# Patient Record
Sex: Male | Born: 1955 | Race: Black or African American | Hispanic: No | Marital: Single | Smoking: Former smoker
Health system: Southern US, Community
[De-identification: ages and names within clinical notes are randomized; demographics above are authoritative.]

## PROBLEM LIST (undated history)

## (undated) DIAGNOSIS — C801 Malignant (primary) neoplasm, unspecified: Secondary | ICD-10-CM

## (undated) DIAGNOSIS — I712 Thoracic aortic aneurysm, without rupture: Secondary | ICD-10-CM

## (undated) DIAGNOSIS — Z87442 Personal history of urinary calculi: Secondary | ICD-10-CM

## (undated) DIAGNOSIS — I639 Cerebral infarction, unspecified: Secondary | ICD-10-CM

## (undated) DIAGNOSIS — G9511 Acute infarction of spinal cord (embolic) (nonembolic): Secondary | ICD-10-CM

## (undated) DIAGNOSIS — I10 Essential (primary) hypertension: Secondary | ICD-10-CM

## (undated) HISTORY — DX: Cerebral infarction, unspecified: I63.9

## (undated) HISTORY — PX: VASCULAR SURGERY: SHX849

## (undated) HISTORY — DX: Acute infarction of spinal cord (embolic) (nonembolic): G95.11

## (undated) HISTORY — PX: CARDIAC CATHETERIZATION: SHX172

## (undated) HISTORY — PX: OTHER SURGICAL HISTORY: SHX169

---

## 2006-10-17 ENCOUNTER — Emergency Department (HOSPITAL_COMMUNITY): Admission: EM | Admit: 2006-10-17 | Discharge: 2006-10-17 | Payer: Self-pay | Admitting: Emergency Medicine

## 2014-09-25 DIAGNOSIS — I712 Thoracic aortic aneurysm, without rupture, unspecified: Secondary | ICD-10-CM

## 2014-09-25 HISTORY — DX: Thoracic aortic aneurysm, without rupture, unspecified: I71.20

## 2014-09-25 HISTORY — DX: Thoracic aortic aneurysm, without rupture: I71.2

## 2014-10-06 ENCOUNTER — Emergency Department (HOSPITAL_COMMUNITY): Payer: Self-pay | Admitting: Anesthesiology

## 2014-10-06 ENCOUNTER — Emergency Department (HOSPITAL_COMMUNITY): Payer: Self-pay

## 2014-10-06 ENCOUNTER — Inpatient Hospital Stay (HOSPITAL_COMMUNITY)
Admission: EM | Admit: 2014-10-06 | Discharge: 2014-10-15 | DRG: 219 | Disposition: A | Discharge status: Home / Self Care | Payer: Self-pay | Attending: Cardiothoracic Surgery | Admitting: Cardiothoracic Surgery

## 2014-10-06 ENCOUNTER — Emergency Department (HOSPITAL_COMMUNITY): Payer: MEDICAID | Admitting: Anesthesiology

## 2014-10-06 ENCOUNTER — Other Ambulatory Visit (HOSPITAL_COMMUNITY): Payer: Self-pay

## 2014-10-06 ENCOUNTER — Encounter (HOSPITAL_COMMUNITY)
Admission: EM | Disposition: A | Discharge status: Home / Self Care | Payer: Self-pay | Source: Home / Self Care | Attending: Cardiothoracic Surgery

## 2014-10-06 ENCOUNTER — Encounter (HOSPITAL_COMMUNITY): Payer: Self-pay | Admitting: Physical Medicine and Rehabilitation

## 2014-10-06 DIAGNOSIS — J690 Pneumonitis due to inhalation of food and vomit: Secondary | ICD-10-CM

## 2014-10-06 DIAGNOSIS — E119 Type 2 diabetes mellitus without complications: Secondary | ICD-10-CM | POA: Diagnosis present

## 2014-10-06 DIAGNOSIS — J189 Pneumonia, unspecified organism: Secondary | ICD-10-CM | POA: Diagnosis present

## 2014-10-06 DIAGNOSIS — I998 Other disorder of circulatory system: Secondary | ICD-10-CM | POA: Diagnosis not present

## 2014-10-06 DIAGNOSIS — K4090 Unilateral inguinal hernia, without obstruction or gangrene, not specified as recurrent: Secondary | ICD-10-CM | POA: Diagnosis present

## 2014-10-06 DIAGNOSIS — I745 Embolism and thrombosis of iliac artery: Secondary | ICD-10-CM | POA: Diagnosis not present

## 2014-10-06 DIAGNOSIS — I10 Essential (primary) hypertension: Secondary | ICD-10-CM | POA: Diagnosis present

## 2014-10-06 DIAGNOSIS — D62 Acute posthemorrhagic anemia: Secondary | ICD-10-CM | POA: Diagnosis not present

## 2014-10-06 DIAGNOSIS — I71019 Dissection of thoracic aorta, unspecified: Secondary | ICD-10-CM

## 2014-10-06 DIAGNOSIS — J9601 Acute respiratory failure with hypoxia: Secondary | ICD-10-CM | POA: Diagnosis not present

## 2014-10-06 DIAGNOSIS — E876 Hypokalemia: Secondary | ICD-10-CM | POA: Diagnosis not present

## 2014-10-06 DIAGNOSIS — I739 Peripheral vascular disease, unspecified: Secondary | ICD-10-CM | POA: Diagnosis present

## 2014-10-06 DIAGNOSIS — I7100 Dissection of unspecified site of aorta: Secondary | ICD-10-CM | POA: Diagnosis present

## 2014-10-06 DIAGNOSIS — I7101 Dissection of thoracic aorta: Secondary | ICD-10-CM

## 2014-10-06 DIAGNOSIS — R0602 Shortness of breath: Secondary | ICD-10-CM

## 2014-10-06 DIAGNOSIS — R079 Chest pain, unspecified: Secondary | ICD-10-CM

## 2014-10-06 DIAGNOSIS — D689 Coagulation defect, unspecified: Secondary | ICD-10-CM | POA: Diagnosis not present

## 2014-10-06 DIAGNOSIS — Z952 Presence of prosthetic heart valve: Secondary | ICD-10-CM

## 2014-10-06 DIAGNOSIS — F17210 Nicotine dependence, cigarettes, uncomplicated: Secondary | ICD-10-CM | POA: Diagnosis present

## 2014-10-06 DIAGNOSIS — Y95 Nosocomial condition: Secondary | ICD-10-CM | POA: Diagnosis not present

## 2014-10-06 HISTORY — PX: THORACIC AORTIC ANEURYSM REPAIR: SHX799

## 2014-10-06 HISTORY — DX: Thoracic aortic aneurysm, without rupture: I71.2

## 2014-10-06 LAB — COMPREHENSIVE METABOLIC PANEL
ALBUMIN: 3.9 g/dL (ref 3.5–5.0)
ALT: 157 U/L — AB (ref 17–63)
ANION GAP: 10 (ref 5–15)
AST: 180 U/L — ABNORMAL HIGH (ref 15–41)
Alkaline Phosphatase: 61 U/L (ref 38–126)
BUN: 15 mg/dL (ref 6–20)
CALCIUM: 9 mg/dL (ref 8.9–10.3)
CHLORIDE: 105 mmol/L (ref 101–111)
CO2: 24 mmol/L (ref 22–32)
Creatinine, Ser: 1.47 mg/dL — ABNORMAL HIGH (ref 0.61–1.24)
GFR calc Af Amer: 59 mL/min — ABNORMAL LOW (ref >60)
GFR calc non Af Amer: 51 mL/min — ABNORMAL LOW (ref >60)
GLUCOSE: 102 mg/dL — AB (ref 65–99)
Potassium: 3.9 mmol/L (ref 3.5–5.1)
SODIUM: 139 mmol/L (ref 135–145)
Total Bilirubin: 1.4 mg/dL — ABNORMAL HIGH (ref 0.3–1.2)
Total Protein: 6.4 g/dL — ABNORMAL LOW (ref 6.5–8.1)

## 2014-10-06 LAB — CBC
HCT: 47.6 % (ref 39.0–52.0)
HEMOGLOBIN: 16.1 g/dL (ref 13.0–17.0)
MCH: 28.9 pg (ref 26.0–34.0)
MCHC: 33.8 g/dL (ref 30.0–36.0)
MCV: 85.5 fL (ref 78.0–100.0)
PLATELETS: 166 10*3/uL (ref 150–400)
RBC: 5.57 MIL/uL (ref 4.22–5.81)
RDW: 14.9 % (ref 11.5–15.5)
WBC: 8.6 10*3/uL (ref 4.0–10.5)

## 2014-10-06 LAB — PROTIME-INR
INR: 1.29 (ref 0.00–1.49)
Prothrombin Time: 16.2 seconds — ABNORMAL HIGH (ref 11.6–15.2)

## 2014-10-06 LAB — PREPARE RBC (CROSSMATCH)

## 2014-10-06 LAB — APTT: aPTT: 31 seconds (ref 24–37)

## 2014-10-06 LAB — I-STAT TROPONIN, ED: Troponin i, poc: 0.06 ng/mL (ref 0.00–0.08)

## 2014-10-06 LAB — BRAIN NATRIURETIC PEPTIDE: B NATRIURETIC PEPTIDE 5: 8.6 pg/mL (ref 0.0–100.0)

## 2014-10-06 LAB — ABO/RH: ABO/RH(D): O POS

## 2014-10-06 SURGERY — REPAIR, ANEURYSM, AORTA, THORACIC, ASCENDING
Anesthesia: General

## 2014-10-06 SURGERY — APPENDECTOMY, LAPAROSCOPIC
Anesthesia: General | Site: Abdomen

## 2014-10-06 MED ORDER — MAGNESIUM SULFATE 50 % IJ SOLN
40.0000 meq | INTRAMUSCULAR | Status: DC
  Filled 2014-10-06: qty 10

## 2014-10-06 MED ORDER — MORPHINE SULFATE 2 MG/ML IJ SOLN
2.0000 mg | Freq: Once | INTRAMUSCULAR | Status: AC
Start: 2014-10-06 — End: 2014-10-06
  Administered 2014-10-06: 2 mg via INTRAVENOUS
  Filled 2014-10-06: qty 1

## 2014-10-06 MED ORDER — LIDOCAINE HCL (CARDIAC) 20 MG/ML IV SOLN
INTRAVENOUS | Status: AC
  Filled 2014-10-06: qty 5

## 2014-10-06 MED ORDER — DOPAMINE-DEXTROSE 3.2-5 MG/ML-% IV SOLN
0.0000 ug/kg/min | INTRAVENOUS | Status: AC
  Administered 2014-10-06: 3 ug/kg/min via INTRAVENOUS
  Filled 2014-10-06: qty 250

## 2014-10-06 MED ORDER — NITROGLYCERIN IN D5W 200-5 MCG/ML-% IV SOLN
2.0000 ug/min | INTRAVENOUS | Status: DC
  Filled 2014-10-06: qty 250

## 2014-10-06 MED ORDER — SODIUM CHLORIDE 0.9 % IV SOLN
INTRAVENOUS | Status: AC
  Administered 2014-10-06: 1.8 [IU]/h via INTRAVENOUS
  Filled 2014-10-06: qty 2.5

## 2014-10-06 MED ORDER — PROTAMINE SULFATE 10 MG/ML IV SOLN
INTRAVENOUS | Status: AC
  Filled 2014-10-06: qty 25

## 2014-10-06 MED ORDER — DEXTROSE 5 % IV SOLN
1.5000 g | INTRAVENOUS | Status: AC
  Administered 2014-10-06: 1.5 g via INTRAVENOUS
  Filled 2014-10-06: qty 1.5

## 2014-10-06 MED ORDER — IOHEXOL 350 MG/ML SOLN: 100.0000 mL | Freq: Once | INTRAVENOUS | Status: AC | PRN

## 2014-10-06 MED ORDER — FENTANYL CITRATE (PF) 250 MCG/5ML IJ SOLN
INTRAMUSCULAR | Status: AC
  Filled 2014-10-06: qty 5

## 2014-10-06 MED ORDER — HEMOSTATIC AGENTS (NO CHARGE) OPTIME
TOPICAL | Status: DC | PRN
  Administered 2014-10-06: 1 via TOPICAL

## 2014-10-06 MED ORDER — SODIUM CHLORIDE 0.9 % IV SOLN
INTRAVENOUS | Status: AC
  Administered 2014-10-06: 69.8 mL/h via INTRAVENOUS
  Filled 2014-10-06: qty 40

## 2014-10-06 MED ORDER — ETOMIDATE 2 MG/ML IV SOLN
INTRAVENOUS | Status: AC
  Filled 2014-10-06: qty 20

## 2014-10-06 MED ORDER — LACTATED RINGERS IV SOLN
INTRAVENOUS | Status: DC | PRN
  Administered 2014-10-06: 19:00:00 via INTRAVENOUS

## 2014-10-06 MED ORDER — LIDOCAINE HCL (CARDIAC) 20 MG/ML IV SOLN
INTRAVENOUS | Status: DC | PRN
  Administered 2014-10-06: 60 mg via INTRAVENOUS

## 2014-10-06 MED ORDER — ROCURONIUM BROMIDE 100 MG/10ML IV SOLN
INTRAVENOUS | Status: DC | PRN
  Administered 2014-10-06: 50 mg via INTRAVENOUS
  Administered 2014-10-06: 20 mg via INTRAVENOUS
  Administered 2014-10-06 – 2014-10-07 (×2): 30 mg via INTRAVENOUS

## 2014-10-06 MED ORDER — HEPARIN SODIUM (PORCINE) 1000 UNIT/ML IJ SOLN
INTRAMUSCULAR | Status: AC
  Administered 2014-10-06: 20 mL
  Filled 2014-10-06: qty 30

## 2014-10-06 MED ORDER — METHYLPREDNISOLONE SODIUM SUCC 125 MG IJ SOLR
INTRAMUSCULAR | Status: DC | PRN
  Administered 2014-10-06: 125 mg via INTRAVENOUS

## 2014-10-06 MED ORDER — 0.9 % SODIUM CHLORIDE (POUR BTL) OPTIME
TOPICAL | Status: DC | PRN
  Administered 2014-10-06: 1000 mL

## 2014-10-06 MED ORDER — DEXMEDETOMIDINE HCL IN NACL 400 MCG/100ML IV SOLN
0.1000 ug/kg/h | INTRAVENOUS | Status: AC
  Administered 2014-10-06: .2 ug/kg/h via INTRAVENOUS
  Filled 2014-10-06: qty 100

## 2014-10-06 MED ORDER — ETOMIDATE 2 MG/ML IV SOLN
INTRAVENOUS | Status: DC | PRN
  Administered 2014-10-06: 12 mg via INTRAVENOUS
  Administered 2014-10-06: 20 mg via INTRAVENOUS

## 2014-10-06 MED ORDER — SODIUM CHLORIDE 0.9 % IV BOLUS (SEPSIS)
1000.0000 mL | Freq: Once | INTRAVENOUS | Status: AC
  Administered 2014-10-06: 1000 mL via INTRAVENOUS

## 2014-10-06 MED ORDER — ROCURONIUM BROMIDE 50 MG/5ML IV SOLN
INTRAVENOUS | Status: AC
  Filled 2014-10-06: qty 3

## 2014-10-06 MED ORDER — DEXTROSE 5 % IV SOLN
750.0000 mg | INTRAVENOUS | Status: DC
  Filled 2014-10-06: qty 750

## 2014-10-06 MED ORDER — VANCOMYCIN HCL 10 G IV SOLR
1250.0000 mg | INTRAVENOUS | Status: AC
  Administered 2014-10-06: 1250 mg via INTRAVENOUS
  Filled 2014-10-06: qty 1250

## 2014-10-06 MED ORDER — EPINEPHRINE HCL 1 MG/ML IJ SOLN
0.0000 ug/min | INTRAMUSCULAR | Status: DC
  Filled 2014-10-06: qty 4

## 2014-10-06 MED ORDER — PHENYLEPHRINE HCL 10 MG/ML IJ SOLN
30.0000 ug/min | INTRAVENOUS | Status: DC
  Filled 2014-10-06: qty 2

## 2014-10-06 MED ORDER — PLASMA-LYTE 148 IV SOLN
INTRAVENOUS | Status: AC
  Administered 2014-10-06: 500 mL
  Filled 2014-10-06: qty 2.5

## 2014-10-06 MED ORDER — MIDAZOLAM HCL 10 MG/2ML IJ SOLN
INTRAMUSCULAR | Status: AC
  Filled 2014-10-06: qty 2

## 2014-10-06 MED ORDER — HEMOSTATIC AGENTS (NO CHARGE) OPTIME
TOPICAL | Status: DC | PRN
  Administered 2014-10-06 – 2014-10-07 (×3): 1 via TOPICAL

## 2014-10-06 MED ORDER — METHYLPREDNISOLONE SODIUM SUCC 125 MG IJ SOLR
INTRAMUSCULAR | Status: AC
  Filled 2014-10-06: qty 2

## 2014-10-06 MED ORDER — POTASSIUM CHLORIDE 2 MEQ/ML IV SOLN
80.0000 meq | INTRAVENOUS | Status: DC
  Filled 2014-10-06: qty 40

## 2014-10-06 MED ORDER — SODIUM CHLORIDE 0.9 % IV SOLN: 10.0000 mL/h | Freq: Once | INTRAVENOUS | Status: DC

## 2014-10-06 MED ORDER — SUCCINYLCHOLINE CHLORIDE 20 MG/ML IJ SOLN
INTRAMUSCULAR | Status: AC
  Filled 2014-10-06: qty 1

## 2014-10-06 MED ORDER — ASPIRIN 81 MG PO CHEW
324.0000 mg | CHEWABLE_TABLET | Freq: Once | ORAL | Status: AC
  Administered 2014-10-06: 324 mg via ORAL
  Filled 2014-10-06: qty 4

## 2014-10-06 MED ORDER — FENTANYL CITRATE (PF) 250 MCG/5ML IJ SOLN
INTRAMUSCULAR | Status: DC | PRN
  Administered 2014-10-06 (×2): 50 ug via INTRAVENOUS
  Administered 2014-10-06: 250 ug via INTRAVENOUS
  Administered 2014-10-06: 150 ug via INTRAVENOUS
  Administered 2014-10-06: 500 ug via INTRAVENOUS
  Administered 2014-10-06 – 2014-10-07 (×2): 100 ug via INTRAVENOUS
  Administered 2014-10-07 (×2): 150 ug via INTRAVENOUS

## 2014-10-06 MED ORDER — SUCCINYLCHOLINE CHLORIDE 20 MG/ML IJ SOLN
INTRAMUSCULAR | Status: DC | PRN
  Administered 2014-10-06: 120 mg via INTRAVENOUS

## 2014-10-06 MED ORDER — MIDAZOLAM HCL 2 MG/2ML IJ SOLN
INTRAMUSCULAR | Status: DC | PRN
  Administered 2014-10-06 (×2): 3 mg via INTRAVENOUS
  Administered 2014-10-06: 4 mg via INTRAVENOUS

## 2014-10-06 MED ORDER — SODIUM CHLORIDE 0.9 % IJ SOLN
OROMUCOSAL | Status: DC | PRN
  Administered 2014-10-06 (×3): 1 mL via TOPICAL

## 2014-10-06 SURGICAL SUPPLY — 131 items
ADAPTER CARDIO PERF ANTE/RETRO (ADAPTER) ×2 IMPLANT
ADH SKN CLS APL DERMABOND .7 (GAUZE/BANDAGES/DRESSINGS) ×1
ADH SRG 12 PREFL SYR 3 SPRDR (MISCELLANEOUS) ×1
ADPR PRFSN 84XANTGRD RTRGD (ADAPTER) ×1
APL SRG 7X2 LUM MLBL SLNT (VASCULAR PRODUCTS) ×3
APPLICATOR TIP COSEAL (VASCULAR PRODUCTS) ×3 IMPLANT
BAG DECANTER FOR FLEXI CONT (MISCELLANEOUS) ×2 IMPLANT
BLADE STERNUM SYSTEM 6 (BLADE) ×2 IMPLANT
BLADE SURG 15 STRL LF DISP TIS (BLADE) ×1 IMPLANT
BLADE SURG 15 STRL SS (BLADE) ×2
BLADE SURG ROTATE 9660 (MISCELLANEOUS) IMPLANT
CANISTER SUCTION 2500CC (MISCELLANEOUS) ×2 IMPLANT
CANN PRFSN .5XCNCT 15X34-48 (MISCELLANEOUS)
CANNULA ARTERIAL 007325 (MISCELLANEOUS) IMPLANT
CANNULA ARTERIAL 14F 007324 (MISCELLANEOUS) IMPLANT
CANNULA PRFSN .5XCNCT 15X34-48 (MISCELLANEOUS) IMPLANT
CANNULA SUMP PERICARDIAL (CANNULA) ×2 IMPLANT
CANNULA VEN 2 STAGE (MISCELLANEOUS)
CATH CPB KIT GERHARDT (MISCELLANEOUS) ×2 IMPLANT
CATH HEMA 3WAY 30CC 24FR COUDE (CATHETERS) ×1 IMPLANT
CATH RETROPLEGIA CORONARY 14FR (CATHETERS) ×2 IMPLANT
CATH THORACIC 28FR (CATHETERS) IMPLANT
CATH/SQUID NICHOLS JEHLE COR (CATHETERS) ×1 IMPLANT
CLIP TI MEDIUM 6 (CLIP) ×2 IMPLANT
CLIP TI WIDE RED SMALL 24 (CLIP) IMPLANT
CLIP TI WIDE RED SMALL 6 (CLIP) IMPLANT
CONN 1/2X1/2X1/2  BEN (MISCELLANEOUS)
CONN 1/2X1/2X1/2 BEN (MISCELLANEOUS) IMPLANT
CONN 3/8X3/8 GISH STERILE (MISCELLANEOUS) IMPLANT
CONN ST 1/4X3/8  BEN (MISCELLANEOUS) ×2
CONN ST 1/4X3/8 BEN (MISCELLANEOUS) IMPLANT
CONN Y 3/8X3/8X3/8  BEN (MISCELLANEOUS)
CONN Y 3/8X3/8X3/8 BEN (MISCELLANEOUS) IMPLANT
CONT SPEC 4OZ CLIKSEAL STRL BL (MISCELLANEOUS) ×2 IMPLANT
CRADLE DONUT ADULT HEAD (MISCELLANEOUS) IMPLANT
DERMABOND ADVANCED (GAUZE/BANDAGES/DRESSINGS) ×1
DERMABOND ADVANCED .7 DNX12 (GAUZE/BANDAGES/DRESSINGS) IMPLANT
DRAIN CHANNEL 15F RND FF W/TCR (WOUND CARE) IMPLANT
DRAIN CHANNEL 19F RND (DRAIN) IMPLANT
DRAIN CHANNEL 28F RND 3/8 FF (WOUND CARE) ×2 IMPLANT
DRAIN SNY 10X20 3/4 PERF (WOUND CARE) IMPLANT
DRAIN WOUND SNY 15 RND (WOUND CARE) IMPLANT
DRSG AQUACEL AG ADV 3.5X14 (GAUZE/BANDAGES/DRESSINGS) ×2 IMPLANT
ELECT BLADE 4.0 EZ CLEAN MEGAD (MISCELLANEOUS) ×2
ELECT CAUTERY BLADE 6.4 (BLADE) ×2 IMPLANT
ELECT REM PT RETURN 9FT ADLT (ELECTROSURGICAL) ×4
ELECTRODE BLDE 4.0 EZ CLN MEGD (MISCELLANEOUS) ×1 IMPLANT
ELECTRODE REM PT RTRN 9FT ADLT (ELECTROSURGICAL) ×2 IMPLANT
EVACUATOR SILICONE 100CC (DRAIN) IMPLANT
FELT TEFLON 6X6 (MISCELLANEOUS) IMPLANT
GAUZE SPONGE 4X4 12PLY STRL (GAUZE/BANDAGES/DRESSINGS) ×4 IMPLANT
GLOVE BIO SURGEON STRL SZ 6.5 (GLOVE) ×9 IMPLANT
GOWN STRL REUS W/ TWL LRG LVL3 (GOWN DISPOSABLE) ×4 IMPLANT
GOWN STRL REUS W/TWL LRG LVL3 (GOWN DISPOSABLE) ×14
GRAFT GELWEAVE INPREG 30X30CM (Vascular Products) ×1 IMPLANT
HANDLE STAPLE ENDO GIA SHORT (STAPLE) ×1
HEMOSTAT POWDER SURGIFOAM 1G (HEMOSTASIS) IMPLANT
HEMOSTAT SURGICEL 2X14 (HEMOSTASIS) IMPLANT
INSERT FOGARTY SM (MISCELLANEOUS) IMPLANT
INSERT FOGARTY XLG (MISCELLANEOUS) ×1 IMPLANT
KIT BASIN OR (CUSTOM PROCEDURE TRAY) ×2 IMPLANT
KIT ROOM TURNOVER OR (KITS) ×2 IMPLANT
KIT SUCTION CATH 14FR (SUCTIONS) IMPLANT
LEAD PACING MYOCARDI (MISCELLANEOUS) ×2 IMPLANT
LOOP VESSEL MAXI BLUE (MISCELLANEOUS) ×1 IMPLANT
NDL 18GX1X1/2 (RX/OR ONLY) (NEEDLE) IMPLANT
NEEDLE 18GX1X1/2 (RX/OR ONLY) (NEEDLE) ×2 IMPLANT
NEEDLE AORTIC AIR ASPIRATING (NEEDLE) IMPLANT
NS IRRIG 1000ML POUR BTL (IV SOLUTION) ×8 IMPLANT
PACK OPEN HEART (CUSTOM PROCEDURE TRAY) ×2 IMPLANT
PAD ARMBOARD 7.5X6 YLW CONV (MISCELLANEOUS) ×4 IMPLANT
RELOAD ENDO GIA 30 3.5 (STAPLE) ×2 IMPLANT
SEALANT SURG COSEAL 8ML (VASCULAR PRODUCTS) ×3 IMPLANT
SPONGE GAUZE 4X4 12PLY STER LF (GAUZE/BANDAGES/DRESSINGS) ×2 IMPLANT
SPONGE LAP 18X18 X RAY DECT (DISPOSABLE) ×2 IMPLANT
SPONGE LAP 4X18 X RAY DECT (DISPOSABLE) ×1 IMPLANT
STAPLER ENDO GIA 12 SHRT THIN (STAPLE) IMPLANT
STAPLER ENDO GIA 12MM SHORT (STAPLE) ×1 IMPLANT
STAPLER VISISTAT 35W (STAPLE) IMPLANT
STOPCOCK 4 WAY LG BORE MALE ST (IV SETS) IMPLANT
STRIP CLOSURE SKIN 1/2X4 (GAUZE/BANDAGES/DRESSINGS) IMPLANT
SUT ETHIBOND 2 0 SH (SUTURE) ×8
SUT ETHIBOND 2 0 SH 36X2 (SUTURE) ×4 IMPLANT
SUT MNCRL AB 3-0 PS2 18 (SUTURE) IMPLANT
SUT PROLENE 3 0 RB 1 (SUTURE) ×3 IMPLANT
SUT PROLENE 3 0 SH 1 (SUTURE) ×4 IMPLANT
SUT PROLENE 3 0 SH DA (SUTURE) ×2 IMPLANT
SUT PROLENE 3 0 SH1 36 (SUTURE) ×6 IMPLANT
SUT PROLENE 4 0 RB 1 (SUTURE) ×10
SUT PROLENE 4 0 SH DA (SUTURE) IMPLANT
SUT PROLENE 4 0 TF (SUTURE) IMPLANT
SUT PROLENE 4-0 RB1 .5 CRCL 36 (SUTURE) IMPLANT
SUT PROLENE 5 0 C 1 36 (SUTURE) ×6 IMPLANT
SUT PROLENE 6 0 C 1 30 (SUTURE) ×2 IMPLANT
SUT PROLENE 6 0 CC (SUTURE) IMPLANT
SUT PROLENE 7 0 BV 1 (SUTURE) IMPLANT
SUT PROLENE 7 0 BV1 MDA (SUTURE) IMPLANT
SUT SILK 1 TIES 10X30 (SUTURE) ×2 IMPLANT
SUT SILK 2 0 SH CR/8 (SUTURE) ×4 IMPLANT
SUT SILK 2 0 TIES 17X18 (SUTURE) ×2
SUT SILK 2-0 18XBRD TIE BLK (SUTURE) ×1 IMPLANT
SUT SILK 3 0 SH CR/8 (SUTURE) ×2 IMPLANT
SUT SILK 4 0 TIES 17X18 (SUTURE) ×2 IMPLANT
SUT STEEL 6MS V (SUTURE) ×2 IMPLANT
SUT STEEL STERNAL CCS#1 18IN (SUTURE) IMPLANT
SUT STEEL SZ 6 DBL 3X14 BALL (SUTURE) IMPLANT
SUT TEM PAC WIRE 2 0 SH (SUTURE) ×4 IMPLANT
SUT VIC AB 1 CT1 18XCR BRD 8 (SUTURE) IMPLANT
SUT VIC AB 1 CT1 8-18 (SUTURE)
SUT VIC AB 1 CTX 18 (SUTURE) ×5 IMPLANT
SUT VIC AB 1 CTX 27 (SUTURE) ×4 IMPLANT
SUT VIC AB 2-0 CT1 27 (SUTURE)
SUT VIC AB 2-0 CT1 TAPERPNT 27 (SUTURE) IMPLANT
SUT VIC AB 2-0 CTX 27 (SUTURE) ×1 IMPLANT
SUT VIC AB 2-0 CTX 36 (SUTURE) ×2 IMPLANT
SUT VIC AB 3-0 SH 27 (SUTURE)
SUT VIC AB 3-0 SH 27X BRD (SUTURE) IMPLANT
SUT VIC AB 3-0 X1 27 (SUTURE) ×1 IMPLANT
SUT VICRYL 4-0 PS2 18IN ABS (SUTURE) IMPLANT
SUTURE E-PAK OPEN HEART (SUTURE) IMPLANT
SYR 10ML KIT SKIN ADHESIVE (MISCELLANEOUS) ×2 IMPLANT
SYR 30ML SLIP (SYRINGE) ×2 IMPLANT
SYSTEM SAHARA CHEST DRAIN ATS (WOUND CARE) ×2 IMPLANT
TAPE CLOTH SURG 4X10 WHT LF (GAUZE/BANDAGES/DRESSINGS) ×2 IMPLANT
TOWEL OR 17X24 6PK STRL BLUE (TOWEL DISPOSABLE) ×2 IMPLANT
TOWEL OR 17X26 10 PK STRL BLUE (TOWEL DISPOSABLE) ×2 IMPLANT
TRAY CATH LUMEN 1 20CM STRL (SET/KITS/TRAYS/PACK) IMPLANT
TUBE CONNECTING 12X1/4 (SUCTIONS) ×1 IMPLANT
TUBE FEEDING 8FR 16IN STR KANG (MISCELLANEOUS) ×2 IMPLANT
WATER STERILE IRR 1000ML POUR (IV SOLUTION) ×4 IMPLANT
YANKAUER SUCT BULB TIP NO VENT (SUCTIONS) ×2 IMPLANT

## 2014-10-06 NOTE — ED Provider Notes (Signed)
CSN: [id number]     Arrival date & time 10/06/14  1629 History   First MD Initiated Contact with Patient 10/06/14 1634     Chief Complaint  Patient presents with  . Chest Pain  . Shortness of Breath     (Consider location/radiation/quality/duration/timing/severity/associated sxs/prior Treatment) Patient is a 59 y.o. male presenting with chest pain and shortness of breath.  Chest Pain Pain location:  Substernal area Pain radiates to:  Mid back Pain radiates to the back: yes   Pain severity:  Moderate Onset quality:  Sudden Duration:  1 hour Timing:  Constant Progression:  Unchanged Chronicity:  New Context: breathing and at rest   Relieved by:  Nothing Worsened by:  Nothing tried Ineffective treatments:  None tried Associated symptoms: diaphoresis and shortness of breath   Associated symptoms: no fever   Risk factors comment:  No medical contact in many years Shortness of Breath Associated symptoms: chest pain and diaphoresis   Associated symptoms: no fever     History reviewed. No pertinent past medical history. History reviewed. No pertinent past surgical history. No family history on file. History  Substance Use Topics  . Smoking status: Current Every Day Smoker    Types: Cigarettes  . Smokeless tobacco: Not on file  . Alcohol Use: No    Review of Systems  Constitutional: Positive for diaphoresis. Negative for fever.  Respiratory: Positive for shortness of breath.   Cardiovascular: Positive for chest pain.  All other systems reviewed and are negative.     Allergies  Review of patient's allergies indicates no known allergies.  Home Medications   Prior to Admission medications   Medication Sig Start Date End Date Taking? Authorizing Provider  acetaminophen (TYLENOL) 500 MG tablet Take 500 mg by mouth every 6 (six) hours as needed (pain).   Yes Historical Provider, MD   BP 134/68 mmHg  Pulse 87  Temp(Src) 97.7 F (36.5 C) (Oral)  Resp 18  Wt 170 lb  (77.111 kg)  SpO2 99% Physical Exam  Constitutional: He is oriented to person, place, and time. He appears well-developed and well-nourished. No distress.  HENT:  Head: Normocephalic and atraumatic.  Eyes: Conjunctivae are normal.  Neck: Neck supple. No tracheal deviation present.  Cardiovascular: Normal rate and regular rhythm.   No pulse differential  Pulmonary/Chest: Effort normal. No respiratory distress.  Abdominal: Soft. He exhibits no distension.  Neurological: He is alert and oriented to person, place, and time. GCS eye subscore is 4. GCS verbal subscore is 5. GCS motor subscore is 6.  Skin: Skin is warm and dry.  Psychiatric: His mood appears anxious.    ED Course  Procedures (including critical care time) Labs Review Labs Reviewed  COMPREHENSIVE METABOLIC PANEL - Abnormal; Notable for the following:    Glucose, Bld 102 (*)    Creatinine, Ser 1.47 (*)    Total Protein 6.4 (*)    AST 180 (*)    ALT 157 (*)    Total Bilirubin 1.4 (*)    GFR calc non Af Amer 51 (*)    GFR calc Af Amer 59 (*)    All other components within normal limits  PROTIME-INR - Abnormal; Notable for the following:    Prothrombin Time 16.2 (*)    All other components within normal limits  CBC  BRAIN NATRIURETIC PEPTIDE  APTT  I-STAT TROPOININ, ED  TYPE AND SCREEN  PREPARE RBC (CROSSMATCH)  ABO/RH    Imaging Review Dg Chest Port 1 View  10/06/2014  CLINICAL DATA:  Chest pain and shortness of breath today.  EXAM: PORTABLE CHEST - 1 VIEW  COMPARISON:  None.  FINDINGS: Upper limits normal heart size and fullness of the mediastinum may be technical.  There is no evidence of focal airspace disease, pulmonary edema, suspicious pulmonary nodule/mass, pleural effusion, or pneumothorax. No acute bony abnormalities are identified.  IMPRESSION: Upper limits normal heart size and mediastinal fullness, which may be technical. Recommend PA and lateral chest radiographs when clinically able.  No other  significant abnormalities identified.   Electronically Signed   By: Harmon Pier M.D.   On: 10/06/2014 16:59   Ct Angio Chest Aorta W/cm &/or Wo/cm  10/06/2014   CLINICAL DATA:  59 year old male with mid sternal chest pain and shortness of breath today, with some associated back pain.  EXAM: CT ANGIOGRAPHY CHEST, ABDOMEN AND PELVIS  TECHNIQUE: Multidetector CT imaging through the chest, abdomen and pelvis was performed using the standard protocol during bolus administration of intravenous contrast. Multiplanar reconstructed images and MIPs were obtained and reviewed to evaluate the vascular anatomy.  CONTRAST:  100 mL of Omnipaque 350.  COMPARISON:  No priors.  FINDINGS: CTA CHEST FINDINGS  Mediastinum/Lymph Nodes: Precontrast images demonstrate no crescentic area of high attenuation associated with the wall of the thoracic aorta to suggest acute intramural hemorrhage. However, postcontrast CT images demonstrate a type A dissection of the thoracic aorta which continues into the abdominal aorta (ultimately extending into the right iliac vasculature as discussed below). The dissection begins in the proximal ascending aorta immediately above the sino-tubular junction, with the dissection flap coming in close proximity to the origin of the right coronary artery, without definite involvement of the RCA at this time. The dissection flap is well separated from the left coronary artery origin. There is no extension into the arch vessels at this time, and the arch vessels all arise from the true lumen. Normal three-vessel arch noted. Ascending thoracic aorta measures up to 3.5 cm in diameter. Mid arch measures up to 2.9 cm in diameter. The isthmus of the thoracic aorta is aneurysmal measuring up to 4.4 cm in diameter. Descending thoracic aorta measures 3.1 cm in diameter. No definite signs of active extravasation are identified in the mediastinum at this time. Heart size is normal. There is no significant pericardial  fluid, thickening or pericardial calcification. No pathologically enlarged mediastinal or hilar lymph nodes. Several borderline enlarged mediastinal lymph nodes are nonspecific, presumably reactive. Esophagus is unremarkable in appearance. No axillary lymphadenopathy.  Lungs/Pleura: No pleural effusion. No acute consolidative airspace disease. No suspicious appearing pulmonary nodules or masses. Linear areas of subsegmental atelectasis or scarring in the lower lobes of the lungs bilaterally and in the lateral segment of the right middle lobe. Mild paraseptal emphysema, most apparent the lung apices. No pneumothorax.  Musculoskeletal/Soft Tissues: There are no aggressive appearing lytic or blastic lesions noted in the visualized portions of the skeleton.  CTA ABDOMEN AND PELVIS FINDINGS  Hepatobiliary: No cystic or solid hepatic lesions. No intra or extrahepatic biliary ductal dilatation. Gallbladder is normal in appearance.  Pancreas: No pancreatic mass. No pancreatic ductal dilatation. No pancreatic or peripancreatic fluid or inflammatory changes.  Spleen: Unremarkable.  Adrenals/Urinary Tract: The right kidney enhances to a lesser degree than the left kidney, presumably secondary to the right renal artery arising from the false lumen which is less well opacified at the time images were acquired on this examination. The right renal artery is widely patent (the dissection flap does not extend  into the right renal artery origin at this time). Kidneys are otherwise normal in appearance bilaterally. Bilateral adrenal glands are normal in appearance. No hydroureteronephrosis. Urinary bladder is normal in appearance.  Stomach/Bowel: Normal appearance of the stomach. No pathologic dilatation of small bowel or colon. Sigmoid colon is very redundant and extends all the way to the left upper quadrant of the abdomen. Numerous loops of small bowel extend into a very large left inguinal hernia.  Vascular/Lymphatic: The aortic  dissection extends through the abdomen into the right iliac vasculature. The dissection flap appears to terminate at or adjacent to the origin of the right internal iliac artery. At this time, the right external iliac artery appears completely occluded proximally, but is reconstituted distally presumably secondary to collateral flow. The right internal iliac artery still remains patent, perfused by the true lumen. As discussed above, a single right renal artery arises from the false lumen, and appears widely patent. Both of the left renal arteries is arise from the true lumen and are widely patent. 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 This is well demonstrated on images 141-143 of series 601. The celiac axis is fed by the true lumen and is otherwise widely patent distally. The dissection flap does not appear to extend into the celiac axis at this time. The superior mesenteric artery is widely patent, and arises from the true lumen. In so arises from the true lumen is widely patent.  Reproductive: Prostate gland and seminal vesicles are unremarkable in appearance.  Other: Massive left inguinal hernia containing a large portion of the small bowel, without evidence of incarceration or obstruction at this time. No significant volume of ascites. No pneumoperitoneum.  Musculoskeletal: There are no aggressive appearing lytic or blastic lesions noted in the visualized portions of the skeleton.  Review of the MIP images confirms the above findings.  IMPRESSION: 1. Type A aortic dissection, as detailed above. Most relevant findings include close proximity of the dissection flap to the origin of the right coronary artery (without definite involvement at this time), bilaminar appearance of the dissection flap immediately adjacent to the origin of the celiac axis (without extension into the celiac axis) which causes severe stenosis of the  celiac axis at this time, and extension of the distal aspect of the flap into the distal right common iliac artery, with occlusion of the proximal right external iliac artery (flow is reconstituted distally, presumably from collaterals). 2. 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 3. Differential perfusion of the kidneys is not related to stenosis of the right renal artery, but rather reflects differential opacification of the true lumen and false lumen of the dissection (right kidney perfuses via a single renal artery from the false lumen, while the left kidney is perfused via two arteries arising from the true lumen). 4. Massive left inguinal hernia containing multiple loops of small bowel. No associated incarceration or obstruction at this time. 5. Additional incidental findings, as above. These results were called by telephone at the time of interpretation on 10/06/2014 at 6:07 pm to Dr. Lyndal Pulley, who verbally acknowledged these results.   Electronically Signed   By: Trudie Reed M.D.   On: 10/06/2014 18:22   Ct Angio Abd/pel W/ And/or W/o  10/06/2014   CLINICAL DATA:  59 year old male with mid sternal chest pain and shortness of breath today, with some associated  back pain.  EXAM: CT ANGIOGRAPHY CHEST, ABDOMEN AND PELVIS  TECHNIQUE: Multidetector CT imaging through the chest, abdomen and pelvis was performed using the standard protocol during bolus administration of intravenous contrast. Multiplanar reconstructed images and MIPs were obtained and reviewed to evaluate the vascular anatomy.  CONTRAST:  100 mL of Omnipaque 350.  COMPARISON:  No priors.  FINDINGS: CTA CHEST FINDINGS  Mediastinum/Lymph Nodes: Precontrast images demonstrate no crescentic area of high attenuation associated with the wall of the thoracic aorta to suggest acute intramural hemorrhage. However, postcontrast CT images demonstrate a type A dissection of the thoracic aorta  which continues into the abdominal aorta (ultimately extending into the right iliac vasculature as discussed below). The dissection begins in the proximal ascending aorta immediately above the sino-tubular junction, with the dissection flap coming in close proximity to the origin of the right coronary artery, without definite involvement of the RCA at this time. The dissection flap is well separated from the left coronary artery origin. There is no extension into the arch vessels at this time, and the arch vessels all arise from the true lumen. Normal three-vessel arch noted. Ascending thoracic aorta measures up to 3.5 cm in diameter. Mid arch measures up to 2.9 cm in diameter. The isthmus of the thoracic aorta is aneurysmal measuring up to 4.4 cm in diameter. Descending thoracic aorta measures 3.1 cm in diameter. No definite signs of active extravasation are identified in the mediastinum at this time. Heart size is normal. There is no significant pericardial fluid, thickening or pericardial calcification. No pathologically enlarged mediastinal or hilar lymph nodes. Several borderline enlarged mediastinal lymph nodes are nonspecific, presumably reactive. Esophagus is unremarkable in appearance. No axillary lymphadenopathy.  Lungs/Pleura: No pleural effusion. No acute consolidative airspace disease. No suspicious appearing pulmonary nodules or masses. Linear areas of subsegmental atelectasis or scarring in the lower lobes of the lungs bilaterally and in the lateral segment of the right middle lobe. Mild paraseptal emphysema, most apparent the lung apices. No pneumothorax.  Musculoskeletal/Soft Tissues: There are no aggressive appearing lytic or blastic lesions noted in the visualized portions of the skeleton.  CTA ABDOMEN AND PELVIS FINDINGS  Hepatobiliary: No cystic or solid hepatic lesions. No intra or extrahepatic biliary ductal dilatation. Gallbladder is normal in appearance.  Pancreas: No pancreatic mass. No  pancreatic ductal dilatation. No pancreatic or peripancreatic fluid or inflammatory changes.  Spleen: Unremarkable.  Adrenals/Urinary Tract: The right kidney enhances to a lesser degree than the left kidney, presumably secondary to the right renal artery arising from the false lumen which is less well opacified at the time images were acquired on this examination. The right renal artery is widely patent (the dissection flap does not extend into the right renal artery origin at this time). Kidneys are otherwise normal in appearance bilaterally. Bilateral adrenal glands are normal in appearance. No hydroureteronephrosis. Urinary bladder is normal in appearance.  Stomach/Bowel: Normal appearance of the stomach. No pathologic dilatation of small bowel or colon. Sigmoid colon is very redundant and extends all the way to the left upper quadrant of the abdomen. Numerous loops of small bowel extend into a very large left inguinal hernia.  Vascular/Lymphatic: The aortic dissection extends through the abdomen into the right iliac vasculature. The dissection flap appears to terminate at or adjacent to the origin of the right internal iliac artery. At this time, the right external iliac artery appears completely occluded proximally, but is reconstituted distally presumably secondary to collateral flow. The right internal iliac artery still  remains patent, perfused by the true lumen. As discussed above, a single right renal artery arises from the false lumen, and appears widely patent. Both of the left renal arteries is arise from the true lumen and are widely patent. 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 This is well demonstrated on images 141-143 of series 601. The celiac axis is fed by the true lumen and is otherwise widely patent distally. The dissection flap does not appear to extend into the celiac axis at this time. The superior  mesenteric artery is widely patent, and arises from the true lumen. In so arises from the true lumen is widely patent.  Reproductive: Prostate gland and seminal vesicles are unremarkable in appearance.  Other: Massive left inguinal hernia containing a large portion of the small bowel, without evidence of incarceration or obstruction at this time. No significant volume of ascites. No pneumoperitoneum.  Musculoskeletal: There are no aggressive appearing lytic or blastic lesions noted in the visualized portions of the skeleton.  Review of the MIP images confirms the above findings.  IMPRESSION: 1. Type A aortic dissection, as detailed above. 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 2. 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 3. Differential perfusion of the kidneys is not related to stenosis of the right renal artery, but rather reflects differential opacification of the true lumen and false lumen of the dissection (right kidney perfuses via a single renal artery from the false lumen, while the left kidney is perfused via two arteries arising from the true lumen). 4. Massive left inguinal hernia containing multiple loops of small bowel. No associated incarceration or obstruction at this time. 5. Additional incidental findings, as above. These results were called by telephone at the time of interpretation on 10/06/2014 at 6:07 pm to Dr. Lyndal Pulley, who verbally acknowledged these results.   Electronically Signed   By: Trudie Reed M.D.   On: 10/06/2014 18:22   I independently viewed and interpreted the above radiology studies and agree with radiologist report.   EKG Interpretation   Date/Time:  Sunday October 06 2014 16:32:32 EDT Ventricular Rate:  62 PR Interval:  191 QRS Duration: 86 QT Interval:  395 QTC Calculation: 401 R Axis:   77 Text Interpretation:  Sinus rhythm Left atrial enlargement Borderline ST  depression, diffuse leads No old tracing to compare Confirmed by [facility]   MD, DAVID ([id number]) on 10/06/2014 4:34:43 PM      MDM   Final diagnoses:  Chest pain  Chest pain at rest  Acute dissection of thoracic aorta    59 year old male who has had no contact with medical provider in many years presents with sudden onset chest and back pain shortness of breath that occurred at rest while playing games one hour prior to arrival. Per EMS, he was extremely agitated and short of breath with coughing at the scene complaining of severe chest,  back, and neck pain. On arrival he is in no acute distress, he is not tachycardic, he is complaining of persistent pain but EKG appeared nonischemic and has no right heart strain pattern.  Considerations for differential include aortic dissection, pulmonary embolism, ACS, or infection. Infection consider less likely with acute onset and lack of prodromal systemic symptoms.  CT for dissection ordered as Pt has also developed lower extremity weakness suggestive of aortic pathology.  Extensive type A dissection identified, CT surgery called and asked for 4u PRBC for OR. Patient remained in critical condition due to tenuous lesion of the aorta but remained hemodynamically stable following a crystalloid bolus for mild hypotension initially on presentation prior to imaging confirmation of dissection. Family updated, sent to operating room with CT surgery for repair  Lyndal Pulley,  MD 10/06/14 2115  Dione Booze, MD 10/07/14 [phone]

## 2014-10-06 NOTE — ED Notes (Signed)
OR team at bedside to shave and prep patient.

## 2014-10-06 NOTE — Anesthesia Procedure Notes (Signed)
Procedure Name: Intubation Date/Time: 10/06/2014 7:33 PM Performed by: Molli Hazard Pre-anesthesia Checklist: Patient identified, Emergency Drugs available, Suction available and Patient being monitored Patient Re-evaluated:Patient Re-evaluated prior to inductionOxygen Delivery Method: Circle system utilized Preoxygenation: Pre-oxygenation with 100% oxygen Intubation Type: IV induction, Rapid sequence and Cricoid Pressure applied Laryngoscope Size: Miller and 2 Grade View: Grade I Tube type: Subglottic suction tube Number of attempts: 1 Airway Equipment and Method: Stylet Placement Confirmation: ETT inserted through vocal cords under direct vision,  positive ETCO2 and breath sounds checked- equal and bilateral Secured at: 23 cm Tube secured with: Tape Dental Injury: Teeth and Oropharynx as per pre-operative assessment

## 2014-10-06 NOTE — ED Notes (Signed)
Cardiology at bedside.

## 2014-10-06 NOTE — H&P (Addendum)
[address]       [place] [address]             [phone]        PETRA SARGEANT [facility] Medical Record #[id number] Date of Birth: 28-Aug-1955  Referring: No ref. provider found Primary Care: No PCP Per Patient  Chief Complaint:    Chief Complaint  Patient presents with  . Chest Pain  . Shortness of Breath    History of Present Illness:     No previous history but no previous medical care either, on no meds. Had sudden onset of cp this afternoon plying video games ct shows type i aortic dissection   Current Activity/ Functional Status: Patient is independent with mobility/ambulation, transfers, ADL's, IADL's.   Zubrod Score: At the time of surgery this patient's most appropriate activity status/level should be described as:     0    Normal activity, no symptoms     1    Restricted in physical strenuous activity but ambulatory, able to do out light work     2    Ambulatory and capable of self care, unable to do work activities, up and about                 more than 50%  Of the time                                3    Only limited self care, in bed greater than 50% of waking hours     4    Completely disabled, no self care, confined to bed or chair     5    Moribund  History reviewed. No pertinent past medical history.  No previous surgery  History  Smoking status  . Current Every Day Smoker  . Types: Cigarettes  Smokeless tobacco  . Not on file    History  Alcohol Use No    History   Social History  . Marital Status: Single    Spouse Name: N/A  . Number of Children: N/A  . Years of Education: N/A   Occupational History  . Not on file.   Social History Main Topics  . Smoking status: Current Every Day Smoker    Types: Cigarettes  . Smokeless tobacco: Not on file  . Alcohol Use: No  . Drug Use: No  . Sexual Activity: Not on file   Other Topics Concern  . Not on file   Social History Narrative    No  Known Allergies  Current Facility-Administered Medications  Medication Dose Route Frequency Provider Last Rate Last Dose  . 0.9 %  sodium chloride infusion  10 mL/hr Intravenous Once Lyndal Pulley, MD      . aminocaproic acid (AMICAR) 10 g in sodium chloride 0.9 % 100 mL infusion   Intravenous To OR Dione Booze, MD      . cefUROXime (ZINACEF) 1.5 g in dextrose 5 % 50 mL IVPB  1.5 g Intravenous To OR Dione Booze, MD      . cefUROXime (ZINACEF) 750 mg in dextrose 5 % 50 mL IVPB  750 mg Intravenous To OR Dione Booze, MD      . dexmedetomidine (PRECEDEX) 400 MCG/100ML (4 mcg/mL) infusion  0.1-0.7 mcg/kg/hr Intravenous To OR Dione Booze, MD      . DOPamine (INTROPIN) 800 mg in dextrose 5 %  250 mL (3.2 mg/mL) infusion  0-10 mcg/kg/min Intravenous To OR Dione Booze, MD      . EPINEPHrine (ADRENALIN) 4 mg in dextrose 5 % 250 mL (0.016 mg/mL) infusion  0-10 mcg/min Intravenous To OR Dione Booze, MD      . heparin 2,500 Units, papaverine 30 mg in electrolyte-148 (PLASMALYTE-148) 500 mL irrigation   Irrigation To OR Dione Booze, MD      . heparin 30,000 units/NS 1000 mL solution for CELLSAVER   Other To OR Dione Booze, MD      . insulin regular (NOVOLIN R,HUMULIN R) 250 Units in sodium chloride 0.9 % 250 mL (1 Units/mL) infusion   Intravenous To OR Dione Booze, MD      . iohexol (OMNIPAQUE) 350 MG/ML injection 100 mL  100 mL Intravenous Once PRN Medication Radiologist, MD      . magnesium sulfate (IV Push/IM) injection 40 mEq  40 mEq Other To OR Dione Booze, MD      . morphine 2 MG/ML injection 2 mg  2 mg Intravenous Once [name], MD      . nitroGLYCERIN 50 mg in dextrose 5 % 250 mL (0.2 mg/mL) infusion  2-200 mcg/min Intravenous To OR Dione Booze, MD      . phenylephrine (NEO-SYNEPHRINE) 20 mg in dextrose 5 % 250 mL (0.08 mg/mL) infusion  30-200 mcg/min Intravenous To OR Dione Booze, MD      . potassium chloride injection 80 mEq  80 mEq Other To OR Dione Booze, MD      . vancomycin (VANCOCIN)  1,250 mg in sodium chloride 0.9 % 250 mL IVPB  1,250 mg Intravenous To OR Dione Booze, MD       Current Outpatient Prescriptions  Medication Sig Dispense Refill  . acetaminophen (TYLENOL) 500 MG tablet Take 500 mg by mouth every 6 (six) hours as needed (pain).       (Not in a hospital admission)  Family history:  No family history of dissection,  No hx of sudden death Father alive 56, Mother 67  Bother dec eased - 69's murdered Sister 2 both healthy No children  Review of Systems:      Cardiac Review of Systems: Y or N  Chest Pain [    ]  Resting SOB [   ] Exertional SOB  [  ]  Orthopnea [  ]   Pedal Edema [   ]    Palpitations [  ] Syncope  [  ]   Presyncope [   ]  General Review of Systems: [Y] = yes [  ]=no Constitional: recent weight change [  ]; anorexia [  ]; fatigue [  ]; nausea [  ]; night sweats [  ]; fever [  ]; or chills [  ]                                                               Dental: poor dentition[  ]; Last Dentist visit:   Eye : blurred vision [  ]; diplopia [   ]; vision changes [  ];  Amaurosis fugax[  ]; Resp: cough [  ];  wheezing[  ];  hemoptysis[  ]; shortness of breath[  ]; paroxysmal nocturnal dyspnea[  ]; dyspnea on exertion[  ];  or orthopnea[  ];  GI:  gallstones[  ], vomiting[  ];  dysphagia[  ]; melena[  ];  hematochezia [  ]; heartburn[  ];   Hx of  Colonoscopy[  ]; GU: kidney stones [  ]; hematuria[  ];   dysuria [  ];  nocturia[  ];  history of     obstruction [  ]; urinary frequency [  ]             Skin: rash, swelling[  ];, hair loss[  ];  peripheral edema[  ];  or itching[  ]; Musculosketetal: myalgias[  ];  joint swelling[  ];  joint erythema[  ];  joint pain[  ];  back pain[  ];  Heme/Lymph: bruising[  ];  bleeding[  ];  anemia[  ];  Neuro: TIA[  ];  headaches[  ];  stroke[  ];  vertigo[  ];  seizures[  ];   paresthesias[  ];  difficulty walking[  ];  Psych:depression[  ]; anxiety[  ];  Endocrine: diabetes[  ];  thyroid dysfunction[   ];  Immunizations: Flu [  ]; Pneumococcal[  ];  Other:  Physical Exam: BP 128/72 mmHg  Pulse 82  Temp(Src) 97.7 F (36.5 C) (Oral)  Resp 18  Wt 170 lb (77.111 kg)  SpO2 100%   General appearance: alert, cooperative, severe distress and pale Head: Normocephalic, without obvious abnormality, atraumatic Neck: no adenopathy, no carotid bruit, no JVD, supple, symmetrical, trachea midline and thyroid not enlarged, symmetric, no tenderness/mass/nodules Lymph nodes: Cervical, supraclavicular, and axillary nodes normal. Resp: clear to auscultation bilaterally Back: symmetric, no curvature. ROM normal. No CVA tenderness. Cardio: regular rate and rhythm, S1, S2 normal, no murmur, click, rub or gallop, no rub and no murmur of AI noted GI: soft, non-tender; bowel sounds normal; no masses,  no organomegaly Extremities: extremities normal, atraumatic, no cyanosis or edema and Homans sign is negative, no sign of DVT Neurologic: Alert and oriented X 3, normal strength and tone. Normal symmetric reflexes. Normal coordination and gait Moves both legs well, complaint of numbness in right lower leg No doppler signal at rt ankle or popliteal level, palpable pulses left foot  Large left inguinal hernia  Diagnostic Studies & Laboratory data:     Recent Radiology Findings:   Dg Chest Port 1 View  10/06/2014   CLINICAL DATA:  Chest pain and shortness of breath today.  EXAM: PORTABLE CHEST - 1 VIEW  COMPARISON:  None.  FINDINGS: Upper limits normal heart size and fullness of the mediastinum may be technical.  There is no evidence of focal airspace disease, pulmonary edema, suspicious pulmonary nodule/mass, pleural effusion, or pneumothorax. No acute bony abnormalities are identified.  IMPRESSION: Upper limits normal heart size and mediastinal fullness, which may be technical. Recommend PA and lateral chest radiographs when clinically able.  No other significant abnormalities identified.   Electronically Signed    By: Harmon Pier M.D.   On: 10/06/2014 16:59   Ct Angio Chest Aorta W/cm &/or Wo/cm  10/06/2014   CLINICAL DATA:  59 year old male with mid sternal chest pain and shortness of breath today, with some associated back pain.  EXAM: CT ANGIOGRAPHY CHEST, ABDOMEN AND PELVIS  TECHNIQUE: Multidetector CT imaging through the chest, abdomen and pelvis was performed using the standard protocol during bolus administration of intravenous contrast. Multiplanar reconstructed images and MIPs were obtained and reviewed to evaluate the vascular anatomy.  CONTRAST:  100 mL of Omnipaque 350.  COMPARISON:  No  priors.  FINDINGS: CTA CHEST FINDINGS  Mediastinum/Lymph Nodes: Precontrast images demonstrate no crescentic area of high attenuation associated with the wall of the thoracic aorta to suggest acute intramural hemorrhage. However, postcontrast CT images demonstrate a type A dissection of the thoracic aorta which continues into the abdominal aorta (ultimately extending into the right iliac vasculature as discussed below). The dissection begins in the proximal ascending aorta immediately above the sino-tubular junction, with the dissection flap coming in close proximity to the origin of the right coronary artery, without definite involvement of the RCA at this time. The dissection flap is well separated from the left coronary artery origin. There is no extension into the arch vessels at this time, and the arch vessels all arise from the true lumen. Normal three-vessel arch noted. Ascending thoracic aorta measures up to 3.5 cm in diameter. Mid arch measures up to 2.9 cm in diameter. The isthmus of the thoracic aorta is aneurysmal measuring up to 4.4 cm in diameter. Descending thoracic aorta measures 3.1 cm in diameter. No definite signs of active extravasation are identified in the mediastinum at this time. Heart size is normal. There is no significant pericardial fluid, thickening or pericardial calcification. No pathologically  enlarged mediastinal or hilar lymph nodes. Several borderline enlarged mediastinal lymph nodes are nonspecific, presumably reactive. Esophagus is unremarkable in appearance. No axillary lymphadenopathy.  Lungs/Pleura: No pleural effusion. No acute consolidative airspace disease. No suspicious appearing pulmonary nodules or masses. Linear areas of subsegmental atelectasis or scarring in the lower lobes of the lungs bilaterally and in the lateral segment of the right middle lobe. Mild paraseptal emphysema, most apparent the lung apices. No pneumothorax.  Musculoskeletal/Soft Tissues: There are no aggressive appearing lytic or blastic lesions noted in the visualized portions of the skeleton.  CTA ABDOMEN AND PELVIS FINDINGS  Hepatobiliary: No cystic or solid hepatic lesions. No intra or extrahepatic biliary ductal dilatation. Gallbladder is normal in appearance.  Pancreas: No pancreatic mass. No pancreatic ductal dilatation. No pancreatic or peripancreatic fluid or inflammatory changes.  Spleen: Unremarkable.  Adrenals/Urinary Tract: The right kidney enhances to a lesser degree than the left kidney, presumably secondary to the right renal artery arising from the false lumen which is less well opacified at the time images were acquired on this examination. The right renal artery is widely patent (the dissection flap does not extend into the right renal artery origin at this time). Kidneys are otherwise normal in appearance bilaterally. Bilateral adrenal glands are normal in appearance. No hydroureteronephrosis. Urinary bladder is normal in appearance.  Stomach/Bowel: Normal appearance of the stomach. No pathologic dilatation of small bowel or colon. Sigmoid colon is very redundant and extends all the way to the left upper quadrant of the abdomen. Numerous loops of small bowel extend into a very large left inguinal hernia.  Vascular/Lymphatic: The aortic dissection extends through the abdomen into the right iliac  vasculature. The dissection flap appears to terminate at or adjacent to the origin of the right internal iliac artery. At this time, the right external iliac artery appears completely occluded proximally, but is reconstituted distally presumably secondary to collateral flow. The right internal iliac artery still remains patent, perfused by the true lumen. As discussed above, a single right renal artery arises from the false lumen, and appears widely patent. Both of the left renal arteries is arise from the true lumen and are widely patent. 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 This is well demonstrated on images 141-143 of series 601. The celiac axis is fed by the true lumen and is otherwise widely patent distally. The dissection flap does not appear to extend into the celiac axis at this time. The superior mesenteric artery is widely patent, and arises from the true lumen. In so arises from the true lumen is widely patent.  Reproductive: Prostate gland and seminal vesicles are unremarkable in appearance.  Other: Massive left inguinal hernia containing a large portion of the small bowel, without evidence of incarceration or obstruction at this time. No significant volume of ascites. No pneumoperitoneum.  Musculoskeletal: There are no aggressive appearing lytic or blastic lesions noted in the visualized portions of the skeleton.  Review of the MIP images confirms the above findings.  IMPRESSION: 1. Type A aortic dissection, as detailed above. Most relevant findings include close proximity of the dissection flap to the origin of the right coronary artery (without definite involvement at this time), bilaminar appearance of the dissection flap immediately adjacent to the origin of the celiac axis (without extension into the celiac axis) which causes severe stenosis of the celiac axis at this time, and extension of the distal  aspect of the flap into the distal right common iliac artery, with occlusion of the proximal right external iliac artery (flow is reconstituted distally, presumably from collaterals). 2. 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 3. Differential perfusion of the kidneys is not related to stenosis of the right renal artery, but rather reflects differential opacification of the true lumen and false lumen of the dissection (right kidney perfuses via a single renal artery from the false lumen, while the left kidney is perfused via two arteries arising from the true lumen). 4. Massive left inguinal hernia containing multiple loops of small bowel. No associated incarceration or obstruction at this time. 5. Additional incidental findings, as above. These results were called by telephone at the time of interpretation on 10/06/2014 at 6:07 pm to Dr. Lyndal Pulley, who verbally acknowledged these results.   Electronically Signed   By: Trudie Reed M.D.   On: 10/06/2014 18:22   .  I have independently reviewed the above radiologic studies.  Recent Lab Findings: Lab Results  Component Value Date   WBC 8.6 10/06/2014   HGB 16.1 10/06/2014   HCT 47.6 10/06/2014   PLT 166 10/06/2014   GLUCOSE 102* 10/06/2014   ALT 157* 10/06/2014   AST 180* 10/06/2014   NA 139 10/06/2014   K 3.9 10/06/2014   CL 105 10/06/2014   CREATININE 1.47* 10/06/2014   BUN 15 10/06/2014   CO2 24 10/06/2014   Chronic Kidney Disease   Stage I     GFR >90  Stage II    GFR 60-89  Stage IIIA GFR 45-59  Stage IIIB GFR 30-44  Stage IV   GFR 15-29  Stage V    GFR  <15  Lab Results  Component Value Date   CREATININE 1.47* 10/06/2014   CrCl cannot be calculated (Unknown ideal weight.).    Assessment / Plan:    acute aortic dissection with ischemic right leg, Otherwise no previous history but has no medical care   The goals risks and alternatives of the planned surgical  procedure Repair of acute aortic dissection  have been discussed with the patient in detail. The risks of the procedure including death, infection, stroke, myocardial infarction, bleeding, blood transfusion have all been  discussed specifically.  I have quoted [name] a 30 % of perioperative mortality and a complication rate as high as 60 %. The patient's questions have been answered.CRYSTAL ELLWOOD is willing  to proceed with the planned procedure.     [name] MD      [address] [place] [address] Office [phone]   Beeper [phone]  10/06/2014 7:00 PM

## 2014-10-06 NOTE — ED Notes (Signed)
Pt transported to OR on cardiac monitor. Vital signs stable.

## 2014-10-06 NOTE — ED Notes (Addendum)
Pt reports 10/10 lower back pain, vital signs stable. Family at bedside.

## 2014-10-06 NOTE — ED Notes (Signed)
Pt given urinal. Family assisting at bedside

## 2014-10-06 NOTE — ED Notes (Addendum)
Pt presents to department via [facility] from home for evaluation of midsternal chest pain and SOB, onset today. Upon arrival c/o upper back pain between shoulders. Respirations unlabored upon arrival to ED. Pt is alert and oriented x4. 18g LAC, 18g RAC. EMS reports patient was laying on floor at home, diaphoretic and spitting up bloody sputum noted.

## 2014-10-06 NOTE — Anesthesia Preprocedure Evaluation (Signed)
Anesthesia Evaluation  Patient identified by MRN, date of birth, ID band Patient awake    Reviewed: Allergy & Precautions, NPO status , Patient's Chart, lab work & pertinent test results  Airway Mallampati: II  TM Distance: >3 FB Neck ROM: Full    Dental  (+) Loose, Poor Dentition,    Pulmonary Current Smoker,  breath sounds clear to auscultation        Cardiovascular Rhythm:Regular Rate:Normal     Neuro/Psych    GI/Hepatic   Endo/Other    Renal/GU      Musculoskeletal   Abdominal   Peds  Hematology   Anesthesia Other Findings   Reproductive/Obstetrics                             Anesthesia Physical Anesthesia Plan  ASA: III  Anesthesia Plan: General   Post-op Pain Management:    Induction: Intravenous  Airway Management Planned: Oral ETT  Additional Equipment: CVP, Arterial line, PA Cath and 3D TEE  Intra-op Plan:   Post-operative Plan:   Informed Consent: I have reviewed the patients History and Physical, chart, labs and discussed the procedure including the risks, benefits and alternatives for the proposed anesthesia with the patient or authorized representative who has indicated his/her understanding and acceptance.   Dental advisory given  Plan Discussed with: CRNA and Anesthesiologist  Anesthesia Plan Comments:         Anesthesia Quick Evaluation

## 2014-10-06 NOTE — Progress Notes (Signed)
Echocardiogram Echocardiogram Transesophageal has been performed.  Dorothey Baseman 10/06/2014, 8:39 PM

## 2014-10-07 ENCOUNTER — Inpatient Hospital Stay (HOSPITAL_COMMUNITY): Payer: Self-pay

## 2014-10-07 ENCOUNTER — Inpatient Hospital Stay (HOSPITAL_COMMUNITY): Payer: MEDICAID | Admitting: Certified Registered Nurse Anesthetist

## 2014-10-07 ENCOUNTER — Encounter (HOSPITAL_COMMUNITY)
Admission: EM | Disposition: A | Discharge status: Home / Self Care | Payer: Self-pay | Source: Home / Self Care | Attending: Cardiothoracic Surgery

## 2014-10-07 ENCOUNTER — Inpatient Hospital Stay (HOSPITAL_COMMUNITY): Payer: Self-pay | Admitting: Certified Registered Nurse Anesthetist

## 2014-10-07 ENCOUNTER — Encounter (HOSPITAL_COMMUNITY): Payer: Self-pay | Admitting: Cardiothoracic Surgery

## 2014-10-07 DIAGNOSIS — I998 Other disorder of circulatory system: Secondary | ICD-10-CM

## 2014-10-07 DIAGNOSIS — I7100 Dissection of unspecified site of aorta: Secondary | ICD-10-CM | POA: Diagnosis present

## 2014-10-07 HISTORY — PX: FEMORAL-FEMORAL BYPASS GRAFT: SHX936

## 2014-10-07 LAB — POCT I-STAT 3, ART BLOOD GAS (G3+)
Acid-base deficit: 3 mmol/L — ABNORMAL HIGH (ref 0.0–2.0)
Acid-base deficit: 4 mmol/L — ABNORMAL HIGH (ref 0.0–2.0)
Acid-base deficit: 4 mmol/L — ABNORMAL HIGH (ref 0.0–2.0)
Acid-base deficit: 5 mmol/L — ABNORMAL HIGH (ref 0.0–2.0)
Acid-base deficit: 4 mmol/L — ABNORMAL HIGH (ref 0.0–2.0)
Bicarbonate: 19.2 mEq/L — ABNORMAL LOW (ref 20.0–24.0)
Bicarbonate: 21.4 mEq/L (ref 20.0–24.0)
Bicarbonate: 21.9 mEq/L (ref 20.0–24.0)
Bicarbonate: 25.1 mEq/L — ABNORMAL HIGH (ref 20.0–24.0)
Bicarbonate: 20.9 mEq/L (ref 20.0–24.0)
Bicarbonate: 24.7 mEq/L — ABNORMAL HIGH (ref 20.0–24.0)
Bicarbonate: 25.7 mEq/L — ABNORMAL HIGH (ref 20.0–24.0)
O2 Saturation: 100 %
O2 Saturation: 100 %
O2 Saturation: 100 %
O2 Saturation: 100 %
O2 Saturation: 94 %
O2 Saturation: 95 %
O2 Saturation: 95 %
Patient temperature: 34.9
Patient temperature: 36.8
Patient temperature: 36.7
TCO2: 20 mmol/L (ref 0–100)
TCO2: 23 mmol/L (ref 0–100)
TCO2: 23 mmol/L (ref 0–100)
TCO2: 27 mmol/L (ref 0–100)
TCO2: 22 mmol/L (ref 0–100)
TCO2: 26 mmol/L (ref 0–100)
TCO2: 27 mmol/L (ref 0–100)
pCO2 arterial: 29 mmHg — ABNORMAL LOW (ref 35.0–45.0)
pCO2 arterial: 36.1 mmHg (ref 35.0–45.0)
pCO2 arterial: 41.4 mmHg (ref 35.0–45.0)
pCO2 arterial: 79.9 mmHg (ref 35.0–45.0)
pCO2 arterial: 34.1 mmHg — ABNORMAL LOW (ref 35.0–45.0)
pCO2 arterial: 41.1 mmHg (ref 35.0–45.0)
pCO2 arterial: 44.1 mmHg (ref 35.0–45.0)
pH, Arterial: 7.105 — CL (ref 7.350–7.450)
pH, Arterial: 7.332 — ABNORMAL LOW (ref 7.350–7.450)
pH, Arterial: 7.382 (ref 7.350–7.450)
pH, Arterial: 7.428 (ref 7.350–7.450)
pH, Arterial: 7.386 (ref 7.350–7.450)
pH, Arterial: 7.372 (ref 7.350–7.450)
pH, Arterial: 7.385 (ref 7.350–7.450)
pO2, Arterial: 202 mmHg — ABNORMAL HIGH (ref 80.0–100.0)
pO2, Arterial: 325 mmHg — ABNORMAL HIGH (ref 80.0–100.0)
pO2, Arterial: 387 mmHg — ABNORMAL HIGH (ref 80.0–100.0)
pO2, Arterial: 389 mmHg — ABNORMAL HIGH (ref 80.0–100.0)
pO2, Arterial: 62 mmHg — ABNORMAL LOW (ref 80.0–100.0)
pO2, Arterial: 75 mmHg — ABNORMAL LOW (ref 80.0–100.0)
pO2, Arterial: 78 mmHg — ABNORMAL LOW (ref 80.0–100.0)

## 2014-10-07 LAB — POCT I-STAT, CHEM 8
BUN: 13 mg/dL (ref 6–20)
BUN: 14 mg/dL (ref 6–20)
BUN: 15 mg/dL (ref 6–20)
BUN: 16 mg/dL (ref 6–20)
BUN: 18 mg/dL (ref 6–20)
BUN: 18 mg/dL (ref 6–20)
BUN: 16 mg/dL (ref 6–20)
Calcium, Ion: 0.81 mmol/L — ABNORMAL LOW (ref 1.12–1.23)
Calcium, Ion: 0.84 mmol/L — ABNORMAL LOW (ref 1.12–1.23)
Calcium, Ion: 0.86 mmol/L — ABNORMAL LOW (ref 1.12–1.23)
Calcium, Ion: 0.98 mmol/L — ABNORMAL LOW (ref 1.12–1.23)
Calcium, Ion: 1.1 mmol/L — ABNORMAL LOW (ref 1.12–1.23)
Calcium, Ion: 1.17 mmol/L (ref 1.12–1.23)
Calcium, Ion: 1 mmol/L — ABNORMAL LOW (ref 1.12–1.23)
Chloride: 103 mmol/L (ref 101–111)
Chloride: 103 mmol/L (ref 101–111)
Chloride: 106 mmol/L (ref 101–111)
Chloride: 107 mmol/L (ref 101–111)
Chloride: 111 mmol/L (ref 101–111)
Chloride: 114 mmol/L — ABNORMAL HIGH (ref 101–111)
Chloride: 102 mmol/L (ref 101–111)
Creatinine, Ser: 0.7 mg/dL (ref 0.61–1.24)
Creatinine, Ser: 0.9 mg/dL (ref 0.61–1.24)
Creatinine, Ser: 1 mg/dL (ref 0.61–1.24)
Creatinine, Ser: 1 mg/dL (ref 0.61–1.24)
Creatinine, Ser: 1 mg/dL (ref 0.61–1.24)
Creatinine, Ser: 1 mg/dL (ref 0.61–1.24)
Creatinine, Ser: 1.2 mg/dL (ref 0.61–1.24)
Glucose, Bld: 108 mg/dL — ABNORMAL HIGH (ref 65–99)
Glucose, Bld: 111 mg/dL — ABNORMAL HIGH (ref 65–99)
Glucose, Bld: 128 mg/dL — ABNORMAL HIGH (ref 65–99)
Glucose, Bld: 148 mg/dL — ABNORMAL HIGH (ref 65–99)
Glucose, Bld: 86 mg/dL (ref 65–99)
Glucose, Bld: 90 mg/dL (ref 65–99)
Glucose, Bld: 125 mg/dL — ABNORMAL HIGH (ref 65–99)
HCT: 27 % — ABNORMAL LOW (ref 39.0–52.0)
HCT: 27 % — ABNORMAL LOW (ref 39.0–52.0)
HCT: 28 % — ABNORMAL LOW (ref 39.0–52.0)
HCT: 30 % — ABNORMAL LOW (ref 39.0–52.0)
HCT: 41 % (ref 39.0–52.0)
HCT: 43 % (ref 39.0–52.0)
HCT: 31 % — ABNORMAL LOW (ref 39.0–52.0)
Hemoglobin: 10.2 g/dL — ABNORMAL LOW (ref 13.0–17.0)
Hemoglobin: 13.9 g/dL (ref 13.0–17.0)
Hemoglobin: 14.6 g/dL (ref 13.0–17.0)
Hemoglobin: 9.2 g/dL — ABNORMAL LOW (ref 13.0–17.0)
Hemoglobin: 9.2 g/dL — ABNORMAL LOW (ref 13.0–17.0)
Hemoglobin: 9.5 g/dL — ABNORMAL LOW (ref 13.0–17.0)
Hemoglobin: 10.5 g/dL — ABNORMAL LOW (ref 13.0–17.0)
Potassium: 4.1 mmol/L (ref 3.5–5.1)
Potassium: 4.2 mmol/L (ref 3.5–5.1)
Potassium: 4.4 mmol/L (ref 3.5–5.1)
Potassium: 4.9 mmol/L (ref 3.5–5.1)
Potassium: 5.2 mmol/L — ABNORMAL HIGH (ref 3.5–5.1)
Potassium: 6.9 mmol/L (ref 3.5–5.1)
Potassium: 3.7 mmol/L (ref 3.5–5.1)
Sodium: 127 mmol/L — ABNORMAL LOW (ref 135–145)
Sodium: 130 mmol/L — ABNORMAL LOW (ref 135–145)
Sodium: 132 mmol/L — ABNORMAL LOW (ref 135–145)
Sodium: 137 mmol/L (ref 135–145)
Sodium: 138 mmol/L (ref 135–145)
Sodium: 139 mmol/L (ref 135–145)
Sodium: 142 mmol/L (ref 135–145)
TCO2: 18 mmol/L (ref 0–100)
TCO2: 19 mmol/L (ref 0–100)
TCO2: 19 mmol/L (ref 0–100)
TCO2: 20 mmol/L (ref 0–100)
TCO2: 20 mmol/L (ref 0–100)
TCO2: 24 mmol/L (ref 0–100)
TCO2: 22 mmol/L (ref 0–100)

## 2014-10-07 LAB — GLUCOSE, CAPILLARY
Glucose-Capillary: 167 mg/dL — ABNORMAL HIGH (ref 65–99)
Glucose-Capillary: 127 mg/dL — ABNORMAL HIGH (ref 65–99)
Glucose-Capillary: 110 mg/dL — ABNORMAL HIGH (ref 65–99)
Glucose-Capillary: 120 mg/dL — ABNORMAL HIGH (ref 65–99)
Glucose-Capillary: 128 mg/dL — ABNORMAL HIGH (ref 65–99)
Glucose-Capillary: 108 mg/dL — ABNORMAL HIGH (ref 65–99)
Glucose-Capillary: 104 mg/dL — ABNORMAL HIGH (ref 65–99)
Glucose-Capillary: 91 mg/dL (ref 65–99)
Glucose-Capillary: 92 mg/dL (ref 65–99)
Glucose-Capillary: 93 mg/dL (ref 65–99)
Glucose-Capillary: 100 mg/dL — ABNORMAL HIGH (ref 65–99)
Glucose-Capillary: 108 mg/dL — ABNORMAL HIGH (ref 65–99)

## 2014-10-07 LAB — CBC
HCT: 36.4 % — ABNORMAL LOW (ref 39.0–52.0)
HCT: 31.6 % — ABNORMAL LOW (ref 39.0–52.0)
Hemoglobin: 12.1 g/dL — ABNORMAL LOW (ref 13.0–17.0)
Hemoglobin: 10.5 g/dL — ABNORMAL LOW (ref 13.0–17.0)
MCH: 27.9 pg (ref 26.0–34.0)
MCH: 27.8 pg (ref 26.0–34.0)
MCHC: 33.2 g/dL (ref 30.0–36.0)
MCHC: 33.2 g/dL (ref 30.0–36.0)
MCV: 83.9 fL (ref 78.0–100.0)
MCV: 83.6 fL (ref 78.0–100.0)
Platelets: 183 10*3/uL (ref 150–400)
Platelets: 153 10*3/uL (ref 150–400)
RBC: 4.34 MIL/uL (ref 4.22–5.81)
RBC: 3.78 MIL/uL — ABNORMAL LOW (ref 4.22–5.81)
RDW: 14.9 % (ref 11.5–15.5)
RDW: 14.8 % (ref 11.5–15.5)
WBC: 15.8 10*3/uL — ABNORMAL HIGH (ref 4.0–10.5)
WBC: 9.7 10*3/uL (ref 4.0–10.5)

## 2014-10-07 LAB — BLOOD PRODUCT ORDER (VERBAL) VERIFICATION

## 2014-10-07 LAB — POCT I-STAT 4, (NA,K, GLUC, HGB,HCT)
Glucose, Bld: 156 mg/dL — ABNORMAL HIGH (ref 65–99)
HCT: 37 % — ABNORMAL LOW (ref 39.0–52.0)
Hemoglobin: 12.6 g/dL — ABNORMAL LOW (ref 13.0–17.0)
Potassium: 4.4 mmol/L (ref 3.5–5.1)
Sodium: 141 mmol/L (ref 135–145)

## 2014-10-07 LAB — BASIC METABOLIC PANEL
ANION GAP: 9 (ref 5–15)
Anion gap: 7 (ref 5–15)
BUN: 13 mg/dL (ref 6–20)
BUN: 12 mg/dL (ref 6–20)
CALCIUM: 7.1 mg/dL — AB (ref 8.9–10.3)
CO2: 23 mmol/L (ref 22–32)
CO2: 26 mmol/L (ref 22–32)
CREATININE: 1.12 mg/dL (ref 0.61–1.24)
Calcium: 7.3 mg/dL — ABNORMAL LOW (ref 8.9–10.3)
Chloride: 108 mmol/L (ref 101–111)
Chloride: 108 mmol/L (ref 101–111)
Creatinine, Ser: 1.17 mg/dL (ref 0.61–1.24)
GFR calc Af Amer: >60 mL/min (ref >60)
GFR calc Af Amer: >60 mL/min (ref >60)
GFR calc non Af Amer: >60 mL/min (ref >60)
GFR calc non Af Amer: >60 mL/min (ref >60)
Glucose, Bld: 163 mg/dL — ABNORMAL HIGH (ref 65–99)
Glucose, Bld: 111 mg/dL — ABNORMAL HIGH (ref 65–99)
Potassium: 4.5 mmol/L (ref 3.5–5.1)
Potassium: 3.9 mmol/L (ref 3.5–5.1)
Sodium: 140 mmol/L (ref 135–145)
Sodium: 141 mmol/L (ref 135–145)

## 2014-10-07 LAB — PROTIME-INR
INR: 1.59 — AB (ref 0.00–1.49)
INR: 2.76 — ABNORMAL HIGH (ref 0.00–1.49)
Prothrombin Time: 19 seconds — ABNORMAL HIGH (ref 11.6–15.2)
Prothrombin Time: 28.8 seconds — ABNORMAL HIGH (ref 11.6–15.2)

## 2014-10-07 LAB — PLATELET COUNT: Platelets: 100 10*3/uL — ABNORMAL LOW (ref 150–400)

## 2014-10-07 LAB — APTT
aPTT: 35 seconds (ref 24–37)
aPTT: >200 seconds (ref 24–37)

## 2014-10-07 LAB — HEMOGLOBIN AND HEMATOCRIT, BLOOD
HCT: 28.4 % — ABNORMAL LOW (ref 39.0–52.0)
Hemoglobin: 9.8 g/dL — ABNORMAL LOW (ref 13.0–17.0)

## 2014-10-07 LAB — FIBRINOGEN: Fibrinogen: 90 mg/dL — CL (ref 204–475)

## 2014-10-07 LAB — MRSA PCR SCREENING: MRSA by PCR: NEGATIVE

## 2014-10-07 LAB — MAGNESIUM
Magnesium: 1.9 mg/dL (ref 1.7–2.4)
Magnesium: 2.8 mg/dL — ABNORMAL HIGH (ref 1.7–2.4)

## 2014-10-07 SURGERY — CREATION, BYPASS, ARTERIAL, FEMORAL TO FEMORAL, USING GRAFT
Anesthesia: General | Site: Groin

## 2014-10-07 MED ORDER — HEPARIN SODIUM (PORCINE) 1000 UNIT/ML IJ SOLN
INTRAMUSCULAR | Status: DC | PRN
  Administered 2014-10-07: 7000 [IU] via INTRAVENOUS

## 2014-10-07 MED ORDER — MAGNESIUM SULFATE 4 GM/100ML IV SOLN
4.0000 g | Freq: Once | INTRAVENOUS | Status: AC
  Administered 2014-10-07: 4 g via INTRAVENOUS
  Filled 2014-10-07: qty 100

## 2014-10-07 MED ORDER — PANTOPRAZOLE SODIUM 40 MG PO TBEC: 40.0000 mg | DELAYED_RELEASE_TABLET | Freq: Every day | ORAL | Status: DC

## 2014-10-07 MED ORDER — MORPHINE SULFATE 2 MG/ML IJ SOLN: 1.0000 mg | INTRAMUSCULAR | Status: AC | PRN

## 2014-10-07 MED ORDER — PHENYLEPHRINE 40 MCG/ML (10ML) SYRINGE FOR IV PUSH (FOR BLOOD PRESSURE SUPPORT)
PREFILLED_SYRINGE | INTRAVENOUS | Status: AC
  Filled 2014-10-07: qty 10

## 2014-10-07 MED ORDER — FENTANYL CITRATE (PF) 250 MCG/5ML IJ SOLN
INTRAMUSCULAR | Status: AC
  Filled 2014-10-07: qty 5

## 2014-10-07 MED ORDER — CHLORHEXIDINE GLUCONATE 0.12 % MT SOLN
15.0000 mL | Freq: Two times a day (BID) | OROMUCOSAL | Status: DC
  Administered 2014-10-07 (×2): 15 mL via OROMUCOSAL
  Filled 2014-10-07 (×2): qty 15

## 2014-10-07 MED ORDER — NITROGLYCERIN IN D5W 200-5 MCG/ML-% IV SOLN: 0.0000 ug/min | INTRAVENOUS | Status: DC

## 2014-10-07 MED ORDER — ASPIRIN EC 325 MG PO TBEC
325.0000 mg | DELAYED_RELEASE_TABLET | Freq: Every day | ORAL | Status: DC
  Administered 2014-10-08 – 2014-10-15 (×6): 325 mg via ORAL
  Filled 2014-10-07 (×10): qty 1

## 2014-10-07 MED ORDER — BISACODYL 10 MG RE SUPP: 10.0000 mg | Freq: Every day | RECTAL | Status: DC

## 2014-10-07 MED ORDER — PHENYLEPHRINE HCL 10 MG/ML IJ SOLN
20.0000 mg | INTRAMUSCULAR | Status: DC | PRN
  Administered 2014-10-07: 25 ug/min via INTRAVENOUS

## 2014-10-07 MED ORDER — PROPOFOL 10 MG/ML IV BOLUS
INTRAVENOUS | Status: DC | PRN
  Administered 2014-10-07: 100 mg via INTRAVENOUS

## 2014-10-07 MED ORDER — SUCCINYLCHOLINE CHLORIDE 20 MG/ML IJ SOLN
INTRAMUSCULAR | Status: AC
  Filled 2014-10-07: qty 1

## 2014-10-07 MED ORDER — FUROSEMIDE 10 MG/ML IJ SOLN
40.0000 mg | Freq: Once | INTRAMUSCULAR | Status: AC
  Administered 2014-10-07: 40 mg via INTRAVENOUS
  Filled 2014-10-07: qty 4

## 2014-10-07 MED ORDER — DEXTROSE 5 % IV SOLN
0.7500 g | INTRAVENOUS | Status: DC | PRN
  Administered 2014-10-07: .75 g via INTRAVENOUS

## 2014-10-07 MED ORDER — METOPROLOL TARTRATE 25 MG/10 ML ORAL SUSPENSION
12.5000 mg | Freq: Two times a day (BID) | ORAL | Status: DC
  Administered 2014-10-07: 12.5 mg
  Filled 2014-10-07 (×12): qty 5

## 2014-10-07 MED ORDER — METOPROLOL TARTRATE 12.5 MG HALF TABLET
12.5000 mg | ORAL_TABLET | Freq: Two times a day (BID) | ORAL | Status: DC
  Administered 2014-10-08 – 2014-10-11 (×6): 12.5 mg via ORAL
  Filled 2014-10-07 (×12): qty 1

## 2014-10-07 MED ORDER — GLYCOPYRROLATE 0.2 MG/ML IJ SOLN
INTRAMUSCULAR | Status: DC | PRN
  Administered 2014-10-07: .6 mg via INTRAVENOUS

## 2014-10-07 MED ORDER — VANCOMYCIN HCL IN DEXTROSE 1-5 GM/200ML-% IV SOLN
1000.0000 mg | Freq: Once | INTRAVENOUS | Status: AC
  Administered 2014-10-07: 1000 mg via INTRAVENOUS
  Filled 2014-10-07: qty 200

## 2014-10-07 MED ORDER — LIDOCAINE HCL (CARDIAC) 20 MG/ML IV SOLN
INTRAVENOUS | Status: AC
  Filled 2014-10-07: qty 5

## 2014-10-07 MED ORDER — SODIUM CHLORIDE 0.9 % IJ SOLN
INTRAMUSCULAR | Status: AC
  Filled 2014-10-07: qty 10

## 2014-10-07 MED ORDER — HEPARIN SODIUM (PORCINE) 1000 UNIT/ML IJ SOLN
INTRAMUSCULAR | Status: AC
  Filled 2014-10-07: qty 1

## 2014-10-07 MED ORDER — SODIUM CHLORIDE 0.45 % IV SOLN
INTRAVENOUS | Status: DC | PRN
  Administered 2014-10-07: 02:00:00 via INTRAVENOUS

## 2014-10-07 MED ORDER — POTASSIUM CHLORIDE 10 MEQ/50ML IV SOLN: 10.0000 meq | INTRAVENOUS | Status: AC

## 2014-10-07 MED ORDER — IOHEXOL 350 MG/ML SOLN
100.0000 mL | Freq: Once | INTRAVENOUS | Status: AC | PRN
  Administered 2014-10-07: 100 mL via INTRAVENOUS

## 2014-10-07 MED ORDER — OXYCODONE HCL 5 MG PO TABS
5.0000 mg | ORAL_TABLET | ORAL | Status: DC | PRN
  Administered 2014-10-07 – 2014-10-10 (×9): 10 mg via ORAL
  Administered 2014-10-11 – 2014-10-12 (×4): 5 mg via ORAL
  Administered 2014-10-13: 10 mg via ORAL
  Administered 2014-10-13: 5 mg via ORAL
  Administered 2014-10-13 – 2014-10-15 (×4): 10 mg via ORAL
  Filled 2014-10-07: qty 2
  Filled 2014-10-07: qty 1
  Filled 2014-10-07 (×5): qty 2
  Filled 2014-10-07: qty 1
  Filled 2014-10-07: qty 2
  Filled 2014-10-07: qty 1
  Filled 2014-10-07 (×3): qty 2
  Filled 2014-10-07: qty 1
  Filled 2014-10-07 (×5): qty 2

## 2014-10-07 MED ORDER — CETYLPYRIDINIUM CHLORIDE 0.05 % MT LIQD
7.0000 mL | Freq: Two times a day (BID) | OROMUCOSAL | Status: DC
  Administered 2014-10-07 – 2014-10-14 (×12): 7 mL via OROMUCOSAL

## 2014-10-07 MED ORDER — ONDANSETRON HCL 4 MG/2ML IJ SOLN
INTRAMUSCULAR | Status: AC
  Filled 2014-10-07: qty 4

## 2014-10-07 MED ORDER — GLYCOPYRROLATE 0.2 MG/ML IJ SOLN
INTRAMUSCULAR | Status: AC
  Filled 2014-10-07: qty 14

## 2014-10-07 MED ORDER — PROTAMINE SULFATE 10 MG/ML IV SOLN
INTRAVENOUS | Status: AC
  Filled 2014-10-07: qty 25

## 2014-10-07 MED ORDER — PROTAMINE SULFATE 10 MG/ML IV SOLN
INTRAVENOUS | Status: DC | PRN
  Administered 2014-10-07: 200 mg via INTRAVENOUS

## 2014-10-07 MED ORDER — DOCUSATE SODIUM 100 MG PO CAPS
200.0000 mg | ORAL_CAPSULE | Freq: Every day | ORAL | Status: DC
  Administered 2014-10-08 – 2014-10-14 (×7): 200 mg via ORAL
  Filled 2014-10-07 (×9): qty 2

## 2014-10-07 MED ORDER — SODIUM CHLORIDE 0.9 % IJ SOLN
3.0000 mL | Freq: Two times a day (BID) | INTRAMUSCULAR | Status: DC
  Administered 2014-10-08 – 2014-10-09 (×3): 3 mL via INTRAVENOUS
  Administered 2014-10-09: 10 mL via INTRAVENOUS
  Administered 2014-10-10: 3 mL via INTRAVENOUS
  Administered 2014-10-10: 10 mL via INTRAVENOUS
  Administered 2014-10-11 – 2014-10-14 (×6): 3 mL via INTRAVENOUS

## 2014-10-07 MED ORDER — SODIUM CHLORIDE 0.9 % IV SOLN
Freq: Once | INTRAVENOUS | Status: AC
  Administered 2014-10-07: 03:00:00 via INTRAVENOUS

## 2014-10-07 MED ORDER — ACETAMINOPHEN 500 MG PO TABS
1000.0000 mg | ORAL_TABLET | Freq: Four times a day (QID) | ORAL | Status: DC
  Administered 2014-10-08 – 2014-10-12 (×13): 1000 mg via ORAL
  Filled 2014-10-07 (×21): qty 2

## 2014-10-07 MED ORDER — SODIUM CHLORIDE 0.9 % IV SOLN
INTRAVENOUS | Status: DC
  Administered 2014-10-07: 02:00:00 via INTRAVENOUS

## 2014-10-07 MED ORDER — DEXTROSE 5 % IV SOLN
0.0000 ug/min | INTRAVENOUS | Status: DC
  Administered 2014-10-08: 50 ug/min via INTRAVENOUS
  Filled 2014-10-07 (×3): qty 2

## 2014-10-07 MED ORDER — ONDANSETRON HCL 4 MG/2ML IJ SOLN
INTRAMUSCULAR | Status: DC | PRN
  Administered 2014-10-07: 4 mg via INTRAVENOUS

## 2014-10-07 MED ORDER — NEOSTIGMINE METHYLSULFATE 10 MG/10ML IV SOLN
INTRAVENOUS | Status: DC | PRN
  Administered 2014-10-07: 4 mg via INTRAVENOUS

## 2014-10-07 MED ORDER — DOPAMINE-DEXTROSE 1.6-5 MG/ML-% IV SOLN
INTRAVENOUS | Status: DC | PRN
  Administered 2014-10-07: 3 ug/kg/min via INTRAVENOUS

## 2014-10-07 MED ORDER — POTASSIUM CHLORIDE 10 MEQ/50ML IV SOLN
10.0000 meq | INTRAVENOUS | Status: AC
  Administered 2014-10-07 (×3): 10 meq via INTRAVENOUS

## 2014-10-07 MED ORDER — ROCURONIUM BROMIDE 50 MG/5ML IV SOLN
INTRAVENOUS | Status: AC
  Filled 2014-10-07: qty 2

## 2014-10-07 MED ORDER — MORPHINE SULFATE 2 MG/ML IJ SOLN
2.0000 mg | INTRAMUSCULAR | Status: DC | PRN
  Administered 2014-10-07: 2 mg via INTRAVENOUS
  Administered 2014-10-07 – 2014-10-12 (×8): 4 mg via INTRAVENOUS
  Filled 2014-10-07: qty 1
  Filled 2014-10-07 (×8): qty 2

## 2014-10-07 MED ORDER — PROTAMINE SULFATE 10 MG/ML IV SOLN
INTRAVENOUS | Status: DC | PRN
  Administered 2014-10-07: 50 mg via INTRAVENOUS

## 2014-10-07 MED ORDER — ROCURONIUM BROMIDE 100 MG/10ML IV SOLN
INTRAVENOUS | Status: DC | PRN
  Administered 2014-10-07: 50 mg via INTRAVENOUS

## 2014-10-07 MED ORDER — ONDANSETRON HCL 4 MG/2ML IJ SOLN
4.0000 mg | Freq: Four times a day (QID) | INTRAMUSCULAR | Status: DC | PRN
  Administered 2014-10-07: 4 mg via INTRAVENOUS
  Filled 2014-10-07: qty 2

## 2014-10-07 MED ORDER — PROPOFOL 10 MG/ML IV BOLUS
INTRAVENOUS | Status: AC
  Filled 2014-10-07: qty 20

## 2014-10-07 MED ORDER — METOPROLOL TARTRATE 1 MG/ML IV SOLN: 2.5000 mg | INTRAVENOUS | Status: DC | PRN

## 2014-10-07 MED ORDER — LACTATED RINGERS IV SOLN: INTRAVENOUS | Status: DC

## 2014-10-07 MED ORDER — MIDAZOLAM HCL 5 MG/5ML IJ SOLN
INTRAMUSCULAR | Status: DC | PRN
  Administered 2014-10-07 (×2): 1 mg via INTRAVENOUS

## 2014-10-07 MED ORDER — DEXMEDETOMIDINE HCL IN NACL 400 MCG/100ML IV SOLN
0.0000 ug/kg/h | INTRAVENOUS | Status: DC
  Administered 2014-10-07: 0.7 ug/kg/h via INTRAVENOUS
  Filled 2014-10-07: qty 100

## 2014-10-07 MED ORDER — LACTATED RINGERS IV SOLN
INTRAVENOUS | Status: DC | PRN
  Administered 2014-10-07: 15:00:00 via INTRAVENOUS

## 2014-10-07 MED ORDER — CETYLPYRIDINIUM CHLORIDE 0.05 % MT LIQD
7.0000 mL | Freq: Four times a day (QID) | OROMUCOSAL | Status: DC
  Administered 2014-10-07 (×2): 7 mL via OROMUCOSAL

## 2014-10-07 MED ORDER — DOPAMINE-DEXTROSE 3.2-5 MG/ML-% IV SOLN
3.0000 ug/kg/min | INTRAVENOUS | Status: DC
  Filled 2014-10-07: qty 250

## 2014-10-07 MED ORDER — SODIUM CHLORIDE 0.9 % IV SOLN
INTRAVENOUS | Status: DC
  Filled 2014-10-07: qty 2.5

## 2014-10-07 MED ORDER — DEXTROSE 5 % IV SOLN
1.5000 g | INTRAVENOUS | Status: AC
  Filled 2014-10-07 (×2): qty 1.5

## 2014-10-07 MED ORDER — INSULIN ASPART 100 UNIT/ML ~~LOC~~ SOLN
0.0000 [IU] | SUBCUTANEOUS | Status: DC
  Administered 2014-10-10: 2 [IU] via SUBCUTANEOUS

## 2014-10-07 MED ORDER — PANTOPRAZOLE SODIUM 40 MG PO TBEC
40.0000 mg | DELAYED_RELEASE_TABLET | Freq: Every day | ORAL | Status: DC
  Administered 2014-10-08 – 2014-10-15 (×8): 40 mg via ORAL
  Filled 2014-10-07 (×9): qty 1

## 2014-10-07 MED ORDER — LACTATED RINGERS IV SOLN: 500.0000 mL | Freq: Once | INTRAVENOUS | Status: AC | PRN

## 2014-10-07 MED ORDER — ACETAMINOPHEN 650 MG RE SUPP
650.0000 mg | Freq: Once | RECTAL | Status: AC
  Administered 2014-10-07: 650 mg via RECTAL

## 2014-10-07 MED ORDER — BISACODYL 5 MG PO TBEC
10.0000 mg | DELAYED_RELEASE_TABLET | Freq: Every day | ORAL | Status: DC
  Administered 2014-10-08 – 2014-10-14 (×6): 10 mg via ORAL
  Filled 2014-10-07 (×7): qty 2

## 2014-10-07 MED ORDER — MIDAZOLAM HCL 2 MG/2ML IJ SOLN: 2.0000 mg | INTRAMUSCULAR | Status: DC | PRN

## 2014-10-07 MED ORDER — NITROPRUSSIDE SODIUM 25 MG/ML IV SOLN
0.0000 ug/kg/min | INTRAVENOUS | Status: DC
  Filled 2014-10-07: qty 2

## 2014-10-07 MED ORDER — MIDAZOLAM HCL 2 MG/2ML IJ SOLN
INTRAMUSCULAR | Status: AC
  Filled 2014-10-07: qty 2

## 2014-10-07 MED ORDER — INSULIN REGULAR BOLUS VIA INFUSION
0.0000 [IU] | Freq: Three times a day (TID) | INTRAVENOUS | Status: DC
  Filled 2014-10-07: qty 10

## 2014-10-07 MED ORDER — FAMOTIDINE IN NACL 20-0.9 MG/50ML-% IV SOLN
20.0000 mg | Freq: Two times a day (BID) | INTRAVENOUS | Status: DC
  Administered 2014-10-07: 20 mg via INTRAVENOUS

## 2014-10-07 MED ORDER — SODIUM CHLORIDE 0.9 % IJ SOLN: 3.0000 mL | INTRAMUSCULAR | Status: DC | PRN

## 2014-10-07 MED ORDER — EPHEDRINE SULFATE 50 MG/ML IJ SOLN
INTRAMUSCULAR | Status: AC
  Filled 2014-10-07: qty 1

## 2014-10-07 MED ORDER — ACETAMINOPHEN 160 MG/5ML PO SOLN: 650.0000 mg | Freq: Once | ORAL | Status: AC

## 2014-10-07 MED ORDER — CEFUROXIME SODIUM 1.5 G IJ SOLR
1.5000 g | Freq: Two times a day (BID) | INTRAMUSCULAR | Status: AC
  Administered 2014-10-07 – 2014-10-08 (×4): 1.5 g via INTRAVENOUS
  Filled 2014-10-07 (×4): qty 1.5

## 2014-10-07 MED ORDER — SODIUM CHLORIDE 0.9 % IV SOLN: 250.0000 mL | INTRAVENOUS | Status: DC

## 2014-10-07 MED ORDER — 0.9 % SODIUM CHLORIDE (POUR BTL) OPTIME
TOPICAL | Status: DC | PRN
  Administered 2014-10-07: 2000 mL

## 2014-10-07 MED ORDER — SODIUM CHLORIDE 0.9 % IR SOLN
Status: DC | PRN
  Administered 2014-10-07: 500 mL

## 2014-10-07 MED ORDER — TRAMADOL HCL 50 MG PO TABS
50.0000 mg | ORAL_TABLET | ORAL | Status: DC | PRN
  Administered 2014-10-08 (×2): 100 mg via ORAL
  Filled 2014-10-07 (×2): qty 2

## 2014-10-07 MED ORDER — ACETAMINOPHEN 160 MG/5ML PO SOLN: 1000.0000 mg | Freq: Four times a day (QID) | ORAL | Status: DC

## 2014-10-07 MED ORDER — NEOSTIGMINE METHYLSULFATE 10 MG/10ML IV SOLN
INTRAVENOUS | Status: AC
  Filled 2014-10-07: qty 2

## 2014-10-07 MED ORDER — FENTANYL CITRATE (PF) 100 MCG/2ML IJ SOLN
INTRAMUSCULAR | Status: DC | PRN
  Administered 2014-10-07 (×5): 50 ug via INTRAVENOUS

## 2014-10-07 MED ORDER — ALBUMIN HUMAN 5 % IV SOLN: 250.0000 mL | INTRAVENOUS | Status: AC | PRN

## 2014-10-07 MED ORDER — DEXTROSE 5 % IV SOLN
1.5000 g | INTRAVENOUS | Status: DC | PRN
  Administered 2014-10-07: 1.5 g via INTRAVENOUS

## 2014-10-07 MED ORDER — ASPIRIN 81 MG PO CHEW
324.0000 mg | CHEWABLE_TABLET | Freq: Every day | ORAL | Status: DC
  Administered 2014-10-07 – 2014-10-11 (×3): 324 mg
  Filled 2014-10-07 (×4): qty 4

## 2014-10-07 MED FILL — Heparin Sodium (Porcine) Inj 1000 Unit/ML: INTRAMUSCULAR | Qty: 10 | Status: AC

## 2014-10-07 MED FILL — Electrolyte-R (PH 7.4) Solution: INTRAVENOUS | Qty: 5000 | Status: AC

## 2014-10-07 MED FILL — Sodium Bicarbonate IV Soln 8.4%: INTRAVENOUS | Qty: 100 | Status: AC

## 2014-10-07 MED FILL — Mannitol IV Soln 20%: INTRAVENOUS | Qty: 500 | Status: AC

## 2014-10-07 MED FILL — Sodium Chloride IV Soln 0.9%: INTRAVENOUS | Qty: 2000 | Status: AC

## 2014-10-07 MED FILL — Lidocaine HCl IV Inj 20 MG/ML: INTRAVENOUS | Qty: 5 | Status: AC

## 2014-10-07 SURGICAL SUPPLY — 55 items
APL SKNCLS STERI-STRIP NONHPOA (GAUZE/BANDAGES/DRESSINGS) ×2
BAG ISL DRAPE 18X18 STRL (DRAPES) ×1
BAG ISOLATION DRAPE 18X18 (DRAPES) ×1 IMPLANT
BENZOIN TINCTURE PRP APPL 2/3 (GAUZE/BANDAGES/DRESSINGS) ×5 IMPLANT
CANISTER SUCTION 2500CC (MISCELLANEOUS) ×3 IMPLANT
CANNULA VESSEL 3MM 2 BLNT TIP (CANNULA) ×7 IMPLANT
CLIP LIGATING EXTRA MED SLVR (CLIP) ×3 IMPLANT
CLIP LIGATING EXTRA SM BLUE (MISCELLANEOUS) ×3 IMPLANT
CLOSURE STERI-STRIP 1/2X4 (GAUZE/BANDAGES/DRESSINGS) ×2
CLOSURE WOUND 1/2 X4 (GAUZE/BANDAGES/DRESSINGS) ×1
CLSR STERI-STRIP ANTIMIC 1/2X4 (GAUZE/BANDAGES/DRESSINGS) ×2 IMPLANT
DRAIN SNY 10X20 3/4 PERF (WOUND CARE) IMPLANT
DRAPE ISOLATION BAG 18X18 (DRAPES) ×2
DRSG COVADERM 4X10 (GAUZE/BANDAGES/DRESSINGS) IMPLANT
ELECT REM PT RETURN 9FT ADLT (ELECTROSURGICAL) ×3
ELECTRODE REM PT RTRN 9FT ADLT (ELECTROSURGICAL) ×1 IMPLANT
EVACUATOR SILICONE 100CC (DRAIN) IMPLANT
GLOVE BIOGEL PI IND STRL 6.5 (GLOVE) ×1 IMPLANT
GLOVE BIOGEL PI IND STRL 7.0 (GLOVE) IMPLANT
GLOVE BIOGEL PI IND STRL 7.5 (GLOVE) IMPLANT
GLOVE BIOGEL PI INDICATOR 6.5 (GLOVE) ×2
GLOVE BIOGEL PI INDICATOR 7.0 (GLOVE) ×2
GLOVE BIOGEL PI INDICATOR 7.5 (GLOVE) ×2
GLOVE ECLIPSE 7.0 STRL STRAW (GLOVE) ×3 IMPLANT
GLOVE SS BIOGEL STRL SZ 7.5 (GLOVE) ×1 IMPLANT
GLOVE SUPERSENSE BIOGEL SZ 7.5 (GLOVE) ×4
GLOVE SURG SS PI 7.0 STRL IVOR (GLOVE) ×2 IMPLANT
GOWN STRL REUS W/ TWL LRG LVL3 (GOWN DISPOSABLE) ×3 IMPLANT
GOWN STRL REUS W/ TWL XL LVL3 (GOWN DISPOSABLE) IMPLANT
GOWN STRL REUS W/TWL LRG LVL3 (GOWN DISPOSABLE) ×9
GOWN STRL REUS W/TWL XL LVL3 (GOWN DISPOSABLE) ×3
GRAFT HEMASHIELD 8MM (Vascular Products) ×3 IMPLANT
GRAFT VASC STRG 30X8KNIT (Vascular Products) ×1 IMPLANT
INSERT FOGARTY SM (MISCELLANEOUS) ×2 IMPLANT
KIT BASIN OR (CUSTOM PROCEDURE TRAY) ×3 IMPLANT
KIT ROOM TURNOVER OR (KITS) ×3 IMPLANT
LINE VENT (MISCELLANEOUS) ×2 IMPLANT
NS IRRIG 1000ML POUR BTL (IV SOLUTION) ×6 IMPLANT
PACK PERIPHERAL VASCULAR (CUSTOM PROCEDURE TRAY) ×3 IMPLANT
PAD ARMBOARD 7.5X6 YLW CONV (MISCELLANEOUS) ×6 IMPLANT
SET CARDIOPLEGIA MPS 5001102 (MISCELLANEOUS) ×3 IMPLANT
SPONGE GAUZE 4X4 12PLY STER LF (GAUZE/BANDAGES/DRESSINGS) ×4 IMPLANT
STAPLER VISISTAT 35W (STAPLE) IMPLANT
STRIP CLOSURE SKIN 1/2X4 (GAUZE/BANDAGES/DRESSINGS) ×2 IMPLANT
SUT ETHILON 3 0 PS 1 (SUTURE) IMPLANT
SUT PROLENE 5 0 C 1 24 (SUTURE) ×10 IMPLANT
SUT PROLENE 6 0 CC (SUTURE) ×2 IMPLANT
SUT SILK 2 0 SH (SUTURE) ×2 IMPLANT
SUT VIC AB 2-0 CTX 36 (SUTURE) ×6 IMPLANT
SUT VIC AB 3-0 SH 27 (SUTURE) ×6
SUT VIC AB 3-0 SH 27X BRD (SUTURE) ×2 IMPLANT
TAPE CLOTH SURG 4X10 WHT LF (GAUZE/BANDAGES/DRESSINGS) ×4 IMPLANT
TRAY FOLEY W/METER SILVER 16FR (SET/KITS/TRAYS/PACK) ×3 IMPLANT
UNDERPAD 30X30 INCONTINENT (UNDERPADS AND DIAPERS) ×3 IMPLANT
WATER STERILE IRR 1000ML POUR (IV SOLUTION) ×3 IMPLANT

## 2014-10-07 NOTE — Op Note (Signed)
    OPERATIVE REPORT  DATE OF SURGERY: 10/07/2014  PATIENT: Victor [name], 59 y.o. 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  PHYSICIAN ASSISTANT: Thomasena Edis PA  ANESTHESIA:  Gen.  EBL: Less than 100 ml  Total I/O In: 402.8 [I.V.:352.8; IV Piggyback:50] Out: 915 [Urine:765; Chest Tube:150]  BLOOD ADMINISTERED: None  DRAINS: None  SPECIMEN: None  COUNTS CORRECT:  YES  PLAN OF CARE: SICU stable   PATIENT DISPOSITION:  PACU - hemodynamically stable  PROCEDURE DETAILS: Patient was taken to the operative placed supine position where the area both groins both legs were prepped and draped in sterile fashion. Incision was made over the right common femoral artery and carried down to isolate the common superficial femoral and profunda femoris arteries at the level of the inguinal ligament. There was no pulse present. There was no external sign of dissection. Next a separate incision was made over the left femoral pulse. The patient had a huge left inguinal hernia and care was taken to stay lateral to this. The common superficial femoral and profundus femoris arteries were encircled with vessel loop. The patient had a large caliber common femoral artery and no evidence of atherosclerotic change. A tunnel was created from the level of the left groin to the right groin again taking care to avoid the large hernia. An 8 mm Hemashield graft was brought through the tunnel. The patient was given 7000 units intravenous heparin. After adequate circulation time the right common superficial femoral and profundus femoris arteries were occluded. The common femoral artery was opened. There was no clot present and there was no evidence of dissection at this level. There was minimal  inflow from the right external iliac artery. The 8 mm Hemashield graft was spatulated and sewn end-to-side to the junction of the common and superficial femoral artery with a running 50 proline suture. Usual flushing maneuvers were undertaken anastomosis was completed. The graft was occluded and the clamps removed from the right femoral arteries. Next the left common superficial femoral and profundus femoris arteries were occluded. The common femoral artery was opened with 11 blade some ulcerative Potts scissors. There was no evidence of dissection at this level. The graft was cut to appropriate length and was spatulated and sewn end-to-side to the artery with a running 50 proline suture. The anastomosis was tested. Clamps were removed. 6 several additional sutures were required for hemostasis. The patient had a palpable dorsalis pedis pulse on the left and right after opening the graft. The patient was given 50 mg of protamine to reverse heparin. Wounds irrigated with saline. Hemostasis tablet cautery. Wounds were closed with 20 Vicryls several layers and the subcutaneous tissue. Skin was closed with 3-0 subcuticular Vicryls suture. Sterile dressing was applied and the patient was taken to the recovery room in stable condition   Gretta Began, M.D. 10/07/2014 5:14 PM

## 2014-10-07 NOTE — Progress Notes (Signed)
Pt started complaining of increased numbness in right leg and right arm along with increased pain in right pelvic area. Right foot cool and pale unable to doppler pulse.  Dr. Arbie Cookey at bedside to assess pt.  Will continue to monitor.

## 2014-10-07 NOTE — Anesthesia Preprocedure Evaluation (Signed)
Anesthesia Evaluation  Patient identified by MRN, date of birth, ID band Patient awake    Reviewed: Allergy & Precautions, NPO status , Patient's Chart, lab work & pertinent test results  Airway Mallampati: II  TM Distance: >3 FB Neck ROM: Full    Dental  (+) Loose, Poor Dentition,    Pulmonary Current Smoker,  breath sounds clear to auscultation        Cardiovascular hypertension, + Peripheral Vascular Disease (Type A aortic dissection s/p replacement ascending aorta. Now for Fem-fem bypass) Rhythm:Regular Rate:Normal     Neuro/Psych negative neurological ROS     GI/Hepatic negative GI ROS, Neg liver ROS,   Endo/Other  negative endocrine ROS  Renal/GU negative Renal ROS     Musculoskeletal negative musculoskeletal ROS (+)   Abdominal   Peds  Hematology  (+) anemia ,   Anesthesia Other Findings   Reproductive/Obstetrics                             Anesthesia Physical  Anesthesia Plan  ASA: III  Anesthesia Plan: General   Post-op Pain Management:    Induction: Intravenous  Airway Management Planned: Oral ETT  Additional Equipment: Arterial line, PA Cath and CVP  Intra-op Plan:   Post-operative Plan:   Informed Consent: I have reviewed the patients History and Physical, chart, labs and discussed the procedure including the risks, benefits and alternatives for the proposed anesthesia with the patient or authorized representative who has indicated his/her understanding and acceptance.   Dental advisory given  Plan Discussed with: CRNA and Anesthesiologist  Anesthesia Plan Comments:         Anesthesia Quick Evaluation

## 2014-10-07 NOTE — Anesthesia Postprocedure Evaluation (Signed)
  Anesthesia Post-op Note  Patient: Victor [name]  Procedure(s) Performed: Procedure(s) with comments: THORACIC ASCENDING ANEURYSM REPAIR (AAA) (N/A) - hyportermia circulatory arrest and resuspension of aortic valve  Patient Location: SICU  Anesthesia Type:General  Level of Consciousness: sedated, unresponsive and Patient remains intubated per anesthesia plan  Airway and Oxygen Therapy: Patient remains intubated per anesthesia plan and Patient placed on Ventilator (see vital sign flow sheet for setting)  Post-op Pain: none  Post-op Assessment: Post-op Vital signs reviewed, Patient's Cardiovascular Status Stable, Respiratory Function Stable, Patent Airway and Pain level controlled              Post-op Vital Signs: stable  Last Vitals:  Filed Vitals:   10/07/14 0545  BP:   Pulse: 90  Temp: 37 C  Resp: 15    Complications: No apparent anesthesia complications

## 2014-10-07 NOTE — Progress Notes (Signed)
      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

## 2014-10-07 NOTE — Progress Notes (Signed)
Patient ID: Victor [name], male   DOB: 1955-11-02, 59 y.o.   MRN: [id number] [facility] ICU PROGRESS NOTE                   [address]            [name] [address]          [phone]   1 Day Post-Op Procedure(s) (LRB): THORACIC ASCENDING ANEURYSM REPAIR (AAA) (N/A)  Total Length of Stay:  LOS: 0 days   Subjective: Patient seen this am, neuro intact still on vent, now weaned and extubated. This am feet warm and faint Doppler dp pulse right foot  Palpable dp and pt on the left . Now patient complains that   Objective: Vital signs in last 24 hours: Temp:  [94.6 F (34.8 C)-98.8 F (37.1 C)] 98.6 F (37 C) (06/13 1345) Pulse Rate:  [65-94] 94 (06/13 1345) Cardiac Rhythm:  [-] Normal sinus rhythm (06/13 1100) Resp:  [11-24] 17 (06/13 1345) BP: (85-134)/(51-75) 99/59 mmHg (06/13 1300) SpO2:  [96 %-100 %] 97 % (06/13 1345) Arterial Line BP: (89-162)/(46-84) 124/55 mmHg (06/13 1345) FiO2 (%):  [40 %-50 %] 40 % (06/13 1000) Weight:  [170 lb (77.111 kg)-178 lb (80.74 kg)] 178 lb (80.74 kg) (06/13 0500)  Filed Weights   10/06/14 1814 10/06/14 1859 10/07/14 0500  Weight: 170 lb (77.111 kg) 170 lb (77.111 kg) 178 lb (80.74 kg)    Weight change:    Hemodynamic parameters for last 24 hours: PAP: (20-33)/(13-25) 25/20 mmHg CO:  [3 L/min-4.6 L/min] 4.6 L/min CI:  [1.5 L/min/m2-2.4 L/min/m2] 2.4 L/min/m2  Intake/Output from previous day: 06/12 0701 - 06/13 0700 In: 6279.9 [I.V.:2909.9; Blood:3020; IV Piggyback:350] Out: 5315 [Urine:3375; Blood:1800; Chest Tube:140]  Intake/Output this shift: Total I/O In: 402.8 [I.V.:352.8; IV Piggyback:50] Out: 790 [Urine:640; Chest Tube:150]  Current Meds: Scheduled Meds: . [START ON 10/08/2014] acetaminophen  1,000 mg Oral 4 times per day   Or  . [START ON 10/08/2014] acetaminophen (TYLENOL) oral liquid 160 mg/5 mL  1,000 mg Per Tube 4 times per day  . antiseptic oral rinse  7 mL Mouth Rinse QID  . aspirin EC  325 mg  Oral Daily   Or  . aspirin  324 mg Per Tube Daily  . [START ON 10/08/2014] bisacodyl  10 mg Oral Daily   Or  . [START ON 10/08/2014] bisacodyl  10 mg Rectal Daily  . cefUROXime (ZINACEF)  IV  1.5 g Intravenous Q12H  . chlorhexidine  15 mL Mouth Rinse BID  . [START ON 10/08/2014] docusate sodium  200 mg Oral Daily  . famotidine (PEPCID) IV  20 mg Intravenous Q12H  . insulin aspart  0-24 Units Subcutaneous 6 times per day  . metoprolol tartrate  12.5 mg Oral BID   Or  . metoprolol tartrate  12.5 mg Per Tube BID  . [START ON 10/08/2014] pantoprazole  40 mg Oral Daily  . [START ON 10/08/2014] sodium chloride  3 mL Intravenous Q12H   Continuous Infusions: . sodium chloride 20 mL/hr at 10/07/14 0200  . [START ON 10/08/2014] sodium chloride    . sodium chloride 20 mL/hr at 10/07/14 0200  . dexmedetomidine Stopped (10/07/14 1000)  . DOPamine 3 mcg/kg/min (10/07/14 0200)  . lactated ringers Stopped (10/07/14 0215)  . lactated ringers 20 mL/hr at 10/07/14 0215  . nitroGLYCERIN Stopped (10/07/14 0800)  . nitroPRUSSide    . phenylephrine (NEO-SYNEPHRINE) Adult infusion Stopped (10/07/14 0515)   PRN Meds:.sodium  chloride, albumin human, lactated ringers, metoprolol, midazolam, morphine injection, ondansetron (ZOFRAN) IV, oxyCODONE, [START ON 10/08/2014] sodium chloride, traMADol  General appearance: alert, cooperative, appears stated age and mild distress Neurologic: right sided weakness Heart: regular rate and rhythm, S1, S2 normal, no murmur, click, rub or gallop Lungs: diminished breath sounds bibasilar Abdomen: soft, non-tender; bowel sounds normal; no masses,  no organomegaly Extremities: as noted lright leg numb, does have motion but , fore foot cooler, different then this am Wound: sternum intact  Lab Results: CBC: Recent Labs  10/07/14 0215 10/07/14 0600  WBC 15.8* 9.7  HGB 12.1*  12.6* 10.5*  HCT 36.4*  37.0* 31.6*  PLT 183 153   BMET:  Recent Labs  10/07/14 0215  10/07/14 0600  NA 140  141 141  K 4.5  4.4 3.9  CL 108 108  CO2 23 26  GLUCOSE 163*  156* 111*  BUN 13 12  CREATININE 1.12 1.17  CALCIUM 7.1* 7.3*    PT/INR:  Recent Labs  10/07/14 0215  LABPROT 19.0*  INR 1.59*   Radiology: Dg Chest Port 1 View  10/07/2014   CLINICAL DATA:  Postoperative radiograph, for repair of aortic dissection. Subsequent encounter.  EXAM: PORTABLE CHEST - 1 VIEW  COMPARISON:  Chest radiograph and CTA of the chest performed 10/06/2014  FINDINGS: The patient's endotracheal tube is seen ending 3-4 cm above the carina. A right IJ Swan-Ganz catheter is noted ending about the main pulmonary outflow tract. An additional right IJ line is noted ending about the mid SVC. An enteric tube is noted ending about the body of the stomach, with the side port about the gastroesophageal junction. Mediastinal drains are noted.  The lungs are mildly hypoexpanded. Mild vascular crowding and vascular congestion are seen. A small right pleural effusion is suspected. No pneumothorax is seen.  The cardiomediastinal silhouette is borderline normal in size. The patient is status post median sternotomy. Scattered postoperative pneumomediastinum is noted. No acute osseous abnormalities are identified.  IMPRESSION: 1. Tubes and lines as described above. 2. Enteric tube ends about the body of the stomach, though the side port is noted about the gastroesophageal junction. This could be advanced slightly, as deemed clinically appropriate. 3. Lungs mildly hypoexpanded. Vascular congestion and small right pleural effusion noted. 4. Scattered postoperative pneumomediastinum noted.   Electronically Signed   By: Roanna Raider M.D.   On: 10/07/2014 02:43   Dg Chest Port 1 View  10/06/2014   CLINICAL DATA:  Chest pain and shortness of breath today.  EXAM: PORTABLE CHEST - 1 VIEW  COMPARISON:  None.  FINDINGS: Upper limits normal heart size and fullness of the mediastinum may be technical.  There is no  evidence of focal airspace disease, pulmonary edema, suspicious pulmonary nodule/mass, pleural effusion, or pneumothorax. No acute bony abnormalities are identified.  IMPRESSION: Upper limits normal heart size and mediastinal fullness, which may be technical. Recommend PA and lateral chest radiographs when clinically able.  No other significant abnormalities identified.   Electronically Signed   By: Harmon Pier M.D.   On: 10/06/2014 16:59   Ct Angio Chest Aorta W/cm &/or Wo/cm  10/06/2014   CLINICAL DATA:  59 year old male with mid sternal chest pain and shortness of breath today, with some associated back pain.  EXAM: CT ANGIOGRAPHY CHEST, ABDOMEN AND PELVIS  TECHNIQUE: Multidetector CT imaging through the chest, abdomen and pelvis was performed using the standard protocol during bolus administration of intravenous contrast. Multiplanar reconstructed images and MIPs were obtained  and reviewed to evaluate the vascular anatomy.  CONTRAST:  100 mL of Omnipaque 350.  COMPARISON:  No priors.  FINDINGS: CTA CHEST FINDINGS  Mediastinum/Lymph Nodes: Precontrast images demonstrate no crescentic area of high attenuation associated with the wall of the thoracic aorta to suggest acute intramural hemorrhage. However, postcontrast CT images demonstrate a type A dissection of the thoracic aorta which continues into the abdominal aorta (ultimately extending into the right iliac vasculature as discussed below). The dissection begins in the proximal ascending aorta immediately above the sino-tubular junction, with the dissection flap coming in close proximity to the origin of the right coronary artery, without definite involvement of the RCA at this time. The dissection flap is well separated from the left coronary artery origin. There is no extension into the arch vessels at this time, and the arch vessels all arise from the true lumen. Normal three-vessel arch noted. Ascending thoracic aorta measures up to 3.5 cm in diameter.  Mid arch measures up to 2.9 cm in diameter. The isthmus of the thoracic aorta is aneurysmal measuring up to 4.4 cm in diameter. Descending thoracic aorta measures 3.1 cm in diameter. No definite signs of active extravasation are identified in the mediastinum at this time. Heart size is normal. There is no significant pericardial fluid, thickening or pericardial calcification. No pathologically enlarged mediastinal or hilar lymph nodes. Several borderline enlarged mediastinal lymph nodes are nonspecific, presumably reactive. Esophagus is unremarkable in appearance. No axillary lymphadenopathy.  Lungs/Pleura: No pleural effusion. No acute consolidative airspace disease. No suspicious appearing pulmonary nodules or masses. Linear areas of subsegmental atelectasis or scarring in the lower lobes of the lungs bilaterally and in the lateral segment of the right middle lobe. Mild paraseptal emphysema, most apparent the lung apices. No pneumothorax.  Musculoskeletal/Soft Tissues: There are no aggressive appearing lytic or blastic lesions noted in the visualized portions of the skeleton.  CTA ABDOMEN AND PELVIS FINDINGS  Hepatobiliary: No cystic or solid hepatic lesions. No intra or extrahepatic biliary ductal dilatation. Gallbladder is normal in appearance.  Pancreas: No pancreatic mass. No pancreatic ductal dilatation. No pancreatic or peripancreatic fluid or inflammatory changes.  Spleen: Unremarkable.  Adrenals/Urinary Tract: The right kidney enhances to a lesser degree than the left kidney, presumably secondary to the right renal artery arising from the false lumen which is less well opacified at the time images were acquired on this examination. The right renal artery is widely patent (the dissection flap does not extend into the right renal artery origin at this time). Kidneys are otherwise normal in appearance bilaterally. Bilateral adrenal glands are normal in appearance. No hydroureteronephrosis. Urinary bladder is  normal in appearance.  Stomach/Bowel: Normal appearance of the stomach. No pathologic dilatation of small bowel or colon. Sigmoid colon is very redundant and extends all the way to the left upper quadrant of the abdomen. Numerous loops of small bowel extend into a very large left inguinal hernia.  Vascular/Lymphatic: The aortic dissection extends through the abdomen into the right iliac vasculature. The dissection flap appears to terminate at or adjacent to the origin of the right internal iliac artery. At this time, the right external iliac artery appears completely occluded proximally, but is reconstituted distally presumably secondary to collateral flow. The right internal iliac artery still remains patent, perfused by the true lumen. As discussed above, a single right renal artery arises from the false lumen, and appears widely patent. Both of the left renal arteries is arise from the true lumen and are widely patent.  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 This is well demonstrated on images 141-143 of series 601. The celiac axis is fed by the true lumen and is otherwise widely patent distally. The dissection flap does not appear to extend into the celiac axis at this time. The superior mesenteric artery is widely patent, and arises from the true lumen. In so arises from the true lumen is widely patent.  Reproductive: Prostate gland and seminal vesicles are unremarkable in appearance.  Other: Massive left inguinal hernia containing a large portion of the small bowel, without evidence of incarceration or obstruction at this time. No significant volume of ascites. No pneumoperitoneum.  Musculoskeletal: There are no aggressive appearing lytic or blastic lesions noted in the visualized portions of the skeleton.  Review of the MIP images confirms the above findings.  IMPRESSION: 1. Type A aortic dissection, as detailed above.  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 2. 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 3. Differential perfusion of the kidneys is not related to stenosis of the right renal artery, but rather reflects differential opacification of the true lumen and false lumen of the dissection (right kidney perfuses via a single renal artery from the false lumen, while the left kidney is perfused via two arteries arising from the true lumen). 4. Massive left inguinal hernia containing multiple loops of small bowel. No associated incarceration or obstruction at this time. 5. Additional incidental findings, as above. These results were called by telephone at the time of interpretation on 10/06/2014 at 6:07 pm to Dr. Lyndal Pulley, who verbally acknowledged these results.   Electronically Signed   By: Trudie Reed M.D.   On: 10/06/2014 18:22   Ct Angio Abd/pel W/ And/or W/o  10/06/2014   CLINICAL DATA:  59 year old male with mid sternal chest pain and shortness of breath today, with some associated back pain.  EXAM: CT ANGIOGRAPHY CHEST, ABDOMEN AND PELVIS  TECHNIQUE: Multidetector CT imaging through the chest, abdomen and pelvis was performed using the standard protocol during bolus administration of intravenous contrast. Multiplanar reconstructed images and MIPs were obtained and reviewed to evaluate the vascular anatomy.  CONTRAST:  100 mL of  Omnipaque 350.  COMPARISON:  No priors.  FINDINGS: CTA CHEST FINDINGS  Mediastinum/Lymph Nodes: Precontrast images demonstrate no crescentic area of high attenuation associated with the wall of the thoracic aorta to suggest acute intramural hemorrhage. However, postcontrast CT images demonstrate a type A dissection of the thoracic aorta which continues into the abdominal aorta (ultimately extending into the right iliac vasculature as discussed below). The dissection begins in the proximal ascending aorta immediately above the sino-tubular junction, with the dissection flap coming in close proximity to the origin of the right coronary artery, without definite involvement of the RCA at this time. The dissection flap is well separated from the left coronary artery origin. There is no extension into the arch  vessels at this time, and the arch vessels all arise from the true lumen. Normal three-vessel arch noted. Ascending thoracic aorta measures up to 3.5 cm in diameter. Mid arch measures up to 2.9 cm in diameter. The isthmus of the thoracic aorta is aneurysmal measuring up to 4.4 cm in diameter. Descending thoracic aorta measures 3.1 cm in diameter. No definite signs of active extravasation are identified in the mediastinum at this time. Heart size is normal. There is no significant pericardial fluid, thickening or pericardial calcification. No pathologically enlarged mediastinal or hilar lymph nodes. Several borderline enlarged mediastinal lymph nodes are nonspecific, presumably reactive. Esophagus is unremarkable in appearance. No axillary lymphadenopathy.  Lungs/Pleura: No pleural effusion. No acute consolidative airspace disease. No suspicious appearing pulmonary nodules or masses. Linear areas of subsegmental atelectasis or scarring in the lower lobes of the lungs bilaterally and in the lateral segment of the right middle lobe. Mild paraseptal emphysema, most apparent the lung apices. No pneumothorax.   Musculoskeletal/Soft Tissues: There are no aggressive appearing lytic or blastic lesions noted in the visualized portions of the skeleton.  CTA ABDOMEN AND PELVIS FINDINGS  Hepatobiliary: No cystic or solid hepatic lesions. No intra or extrahepatic biliary ductal dilatation. Gallbladder is normal in appearance.  Pancreas: No pancreatic mass. No pancreatic ductal dilatation. No pancreatic or peripancreatic fluid or inflammatory changes.  Spleen: Unremarkable.  Adrenals/Urinary Tract: The right kidney enhances to a lesser degree than the left kidney, presumably secondary to the right renal artery arising from the false lumen which is less well opacified at the time images were acquired on this examination. The right renal artery is widely patent (the dissection flap does not extend into the right renal artery origin at this time). Kidneys are otherwise normal in appearance bilaterally. Bilateral adrenal glands are normal in appearance. No hydroureteronephrosis. Urinary bladder is normal in appearance.  Stomach/Bowel: Normal appearance of the stomach. No pathologic dilatation of small bowel or colon. Sigmoid colon is very redundant and extends all the way to the left upper quadrant of the abdomen. Numerous loops of small bowel extend into a very large left inguinal hernia.  Vascular/Lymphatic: The aortic dissection extends through the abdomen into the right iliac vasculature. The dissection flap appears to terminate at or adjacent to the origin of the right internal iliac artery. At this time, the right external iliac artery appears completely occluded proximally, but is reconstituted distally presumably secondary to collateral flow. The right internal iliac artery still remains patent, perfused by the true lumen. As discussed above, a single right renal artery arises from the false lumen, and appears widely patent. Both of the left renal arteries is arise from the true lumen and are widely patent. 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 This is well demonstrated on images 141-143 of series 601. The celiac axis is fed by the true lumen and is otherwise widely patent distally. The dissection flap does not appear to extend into the celiac axis at this time. The superior mesenteric artery is widely patent, and arises from the true lumen. In so arises from the true lumen is widely patent.  Reproductive: Prostate gland and seminal vesicles are unremarkable in appearance.  Other: Massive left inguinal hernia containing a large portion of the small bowel, without evidence of incarceration or obstruction at this time. No significant volume of ascites. No pneumoperitoneum.  Musculoskeletal: There are no aggressive appearing lytic or blastic lesions noted  in the visualized portions of the skeleton.  Review of the MIP images confirms the above findings.  IMPRESSION: 1. Type A aortic dissection, as detailed above. 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 2. 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 3. Differential perfusion of the kidneys is not related to stenosis of the right renal artery, but rather reflects differential opacification of the true lumen and false lumen of the dissection (right kidney perfuses via a single renal artery from the false lumen, while the left  kidney is perfused via two arteries arising from the true lumen). 4. Massive left inguinal hernia containing multiple loops of small bowel. No associated incarceration or obstruction at this time. 5. Additional incidental findings, as above. These results were called by telephone at the time of interpretation on 10/06/2014 at 6:07 pm to Dr. Lyndal Pulley, who verbally acknowledged these results.   Electronically Signed   By: Trudie Reed M.D.   On: 10/06/2014 18:22     Assessment/Plan: S/P Procedure(s) (LRB): THORACIC ASCENDING ANEURYSM REPAIR (AAA) (N/A) Mobilize Diabetes control Continue foley due to strict I&O, patient critically ill, patient in ICU and urinary output monitoring As observed during day, past hour patient noted he has increasing right hip pain and right leg numbness. Discussed with vascular with change now will pro cede with fem- fem bypass Renal function stable, cr lower then base line Leave mt in for now until with need for repeat or and heparin    [name] 10/07/2014 2:37 PM

## 2014-10-07 NOTE — OR Nursing (Signed)
SICU first call off pump @ 0014;  45 minutes call @ 0041: and 20 minutes call @ 0115

## 2014-10-07 NOTE — Consult Note (Signed)
      Patient name: Victor [name] MRN: [id number] DOB: 04-Aug-1955 Sex: male   Referred by: Tyrone Sage  Reason for referral:  Chief Complaint  Patient presents with  . Chest Pain  . Shortness of Breath    HISTORY OF PRESENT ILLNESS: The patient presented to the emergency room yesterday evening with chest pain. Evaluation revealed acute ascending aortic dissection. 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 He underwent emergent valve replacement and replacement of his ascending arch by Dr. Tyrone Sage. Saw him earlier this morning when he was being extubated. At that time he appeared to have motor and sensory function in his foot although he had a diminished Doppler flow. Over the past hour he is now been complaining of severe pain in his right hip and is uncomfortable and has numbness from his mid thigh down into his foot. He still has some motor function in his foot but markedly diminished sensory function. He denies abdominal pain  History reviewed. No pertinent past medical history.  History reviewed. No pertinent past surgical history.  History   Social History  . Marital Status: Single    Spouse Name: N/A  . Number of Children: N/A  . Years of Education: N/A   Occupational History  . Not on file.   Social History Main Topics  . Smoking status: Current Every Day Smoker    Types: Cigarettes  . Smokeless tobacco: Not on file  . Alcohol Use: No  . Drug Use: No  . Sexual Activity: Not on file   Other Topics Concern  . Not on file   Social History Narrative    No family history on file.  Allergies as of 10/06/2014  . (No Known Allergies)    No current facility-administered medications on file prior to encounter.   No current outpatient prescriptions on file prior to encounter.     REVIEW OF SYSTEMS:  Reviewed in his chart with nothing  PHYSICAL EXAMINATION:  General: The patient is a  well-nourished male, in no acute distress. Vital signs are BP 99/59 mmHg  Pulse 94  Temp(Src) 98.6 F (37 C) (Core (Comment))  Resp 17  Ht 5\' 10"  (1.778 m)  Wt 178 lb (80.74 kg)  BMI 25.54 kg/m2  SpO2 97% Pulmonary: There is a good air exchange   Abdomen: Soft and non-tender no abdominal pain. No masses noted. Does have a large left inguinal hernia with bowel contents in his scrotum Musculoskeletal: There are no major deformities.  There is no significant extremity pain. Neurologic: No sensory function from his mid thigh distally into his foot on the right. Normal on the left Skin: There are no ulcer or rashes noted. Psychiatric: The patient has normal affect. Cardiovascular: 2+ left femoral and 2+ left dorsalis pedis pulse. Absent right femoral and popliteal pulses. 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 Will return to the operating room for right to left femorofemoral    Corissa Oguinn [facility] [place] Office: [phone]

## 2014-10-07 NOTE — Transfer of Care (Signed)
Immediate Anesthesia Transfer of Care Note  Patient: Victor [name]  Procedure(s) Performed: Procedure(s) with comments: THORACIC ASCENDING ANEURYSM REPAIR (AAA) (N/A) - hyportermia circulatory arrest and resuspension of aortic valve  Patient Location: SICU  Anesthesia Type:General  Level of Consciousness: Patient remains intubated per anesthesia plan  Airway & Oxygen Therapy: Patient remains intubated per anesthesia plan and Patient placed on Ventilator (see vital sign flow sheet for setting)  Post-op Assessment: Report given to RN and Post -op Vital signs reviewed and stable  Post vital signs: Reviewed and stable  Last Vitals:  Filed Vitals:   10/06/14 1900  BP: 134/68  Pulse: 87  Temp:   Resp:     Complications: No apparent anesthesia complications

## 2014-10-07 NOTE — Progress Notes (Signed)
CT surgery p.m. Rounds  Patient sitting up in bed comfortable Excellent pulses in both feet Increased oxygen requirement after returning the OR for femorofemoral bypass-will dose with IV Lasix for diuresis of intraoperative fluid administration-postop creatinine normal

## 2014-10-07 NOTE — Anesthesia Procedure Notes (Signed)
Procedure Name: Intubation Date/Time: 10/07/2014 3:47 PM Performed by: Dairl Ponder Pre-anesthesia Checklist: Patient identified, Emergency Drugs available, Suction available, Patient being monitored and Timeout performed Patient Re-evaluated:Patient Re-evaluated prior to inductionOxygen Delivery Method: Circle system utilized Preoxygenation: Pre-oxygenation with 100% oxygen Intubation Type: IV induction Ventilation: Oral airway inserted - appropriate to patient size and Mask ventilation without difficulty Laryngoscope Size: Mac and 4 Grade View: Grade II Tube type: Subglottic suction tube Tube size: 7.5 mm Number of attempts: 1 Airway Equipment and Method: Stylet Placement Confirmation: ETT inserted through vocal cords under direct vision,  positive ETCO2 and breath sounds checked- equal and bilateral Secured at: 24 cm Tube secured with: Tape Dental Injury: Teeth and Oropharynx as per pre-operative assessment

## 2014-10-07 NOTE — CV Procedure (Signed)
Intra-operative Transesophageal Echocardiography Report:  Victor [name] is a 59 year old male who presented to the [facility] Emergency Department with the sudden onset of severe midsternal chest pain radiating to the to the back between the  shoulder blades. A chest CT revealed a Type A aortic dissection with extension into the right iliac artery. He is now brought to the operating room for emergency repair of his Type A aortic dissection. Intra-operative transesophageal echocardiography was indicated to evaluate the extent of the dissection, to evaluate the aortic valve, to assess for any other cardiac pathology, and to serve as a monitor for intraoperative volume status.  The patient was brought to the operating room at [facility] and general anesthesia was induced without difficulty. Following endotracheal intubation and orogastric suctioning, the transesophageal echocardiography probe was inserted into the esophagus without difficulty.  Impression: Pre-bypass findings:  1. Aortic valve: The aortic valve was trileaflet. The leaflets were thin,  pliable, without calcification, and opened without restriction. There was no aortic insufficiency. The aortic dissection flap appeared to extend into the right sinus of Valsalva and abutted the right coronary ostium but did not appear to obstruct the right coronary ostium. The left coronary cusp and left coronary artery did not appear to be involved by aortic dissection.  2. Ascending aorta: There was an extensive dissection noted with an obvious dissection flap extending from the right coronary cusp to as far as could be visualized in the ascending aorta.   3. Mitral valve: The mitral leaflets appeared thin and pliable and coapted normally without prolapse or fluttering. There was 1+ mitral insufficiency. There was no annular calcification noted.  4. Left ventricle: Left ventricular cavity was of normal size and measured 35 mm at end diastole  at the mid-papillary level in the transgastric short axis view. There was vigorous contractility in all segments interrogated and the ejection fraction was estimated at 60-65%.  There was mild global left ventricular hypertrophy. Left ventricular wall thickness measured in the 10-11.5 cm range at the mid-papillary level in the transgastric short axis view at end diastole.  5. Right ventricle: There was normal appearing right ventricular function. The right ventricular size was was normal. There was good contractility of the right ventricular free wall and normal appearing tricuspid annular plane systolic excursion.  6. Tricuspid valve: The tricuspid leaflets appeared structurally normal and there was 1+ tricuspid insufficiency.  7. Interatrial septum: There was mild to moderate lipomatous hypertrophy of the interatrial septum. There was no evidence of patent foramen ovale or atrial septal defect by color Doppler.  8. Left atrium: There was no thrombus noted within left atrial cavity or left atrial appendage.  9. Descending aorta: The aortic dissection  involved the descending aorta to the full extent that could be imaged. The true lumen appeared to occupy approximately 25% of the cross-sectional area of the descending aorta and the false lumen appeared occupy approximate 75%. There was no pleural effusion noted.  Post-bypass findings:  1. Aortic valve: There was no aortic insufficiency. The aortic leaflets appeared to open normally and appeared to be structurally intact.  2. Ascending aorta: A dissection flap could no longer be imaged in the ascending aorta.  3. Mitral valve: The mitral valve appeared unchanged from the pre-bypass study. The leaflets coapted normally with trace to 1+ mitral insufficiency.  4. Left ventricle: There was vigorous contractility in all segments interrogated and the ejection fraction was estimated at 60-65%. There were no regional wall motion abnormalities.  5.  Right  ventricle: The right ventricular function appeared normal and there was good contractility of the right ventricular free free wall and normal right ventricular size.  6. Tricuspid valve: The tricuspid valve was unchanged from the pre-bypass study there was 1+ tricuspid insufficiency.  7. Descending aorta: The dissection flap could again be visualized throughout the extent of the descending aorta that could be imaged. The size of the dissection flap appeared unchanged with the false lumen appearing to occupy approximate 75% of the luminal cross-sectional area.  Kipp Brood, MD

## 2014-10-07 NOTE — Anesthesia Postprocedure Evaluation (Signed)
  Anesthesia Post-op Note  Patient: Victor [name]  Procedure(s) Performed: Procedure(s): LEFT  FEMORAL ARTERY -RIGHT FEMORAL ARTERY BYPASS GRAFT USING X 30 CM HEMASHIELD GOLD GRAFT (N/A)  Patient Location: PACU  Anesthesia Type: General   Level of Consciousness: awake, alert  and oriented  Airway and Oxygen Therapy: Patient Spontanous Breathing  Post-op Pain: mild  Post-op Assessment: Post-op Vital signs reviewed  Post-op Vital Signs: Reviewed  Last Vitals:  Filed Vitals:   10/07/14 1900  BP: 107/59  Pulse: 93  Temp: 37.1 C  Resp: 35    Complications: No apparent anesthesia complications

## 2014-10-07 NOTE — Transfer of Care (Signed)
Immediate Anesthesia Transfer of Care Note  Patient: Victor [name]  Procedure(s) Performed: Procedure(s): LEFT  FEMORAL ARTERY -RIGHT FEMORAL ARTERY BYPASS GRAFT USING X 30 CM HEMASHIELD GOLD GRAFT (N/A)  Patient Location: PACU  Anesthesia Type:General  Level of Consciousness: awake, alert  and oriented  Airway & Oxygen Therapy: Patient Spontanous Breathing and Patient connected to face mask oxygen  Post-op Assessment: Report given to RN and Post -op Vital signs reviewed and stable  Post vital signs: Reviewed and stable  Last Vitals:  Filed Vitals:   10/07/14 1740  BP:   Pulse:   Temp: 36.1 C  Resp:     Complications: No apparent anesthesia complications

## 2014-10-07 NOTE — Brief Op Note (Addendum)
      [address]       [name] [address]             [phone]      10/06/2014  1:46 AM  PATIENT:  Victor [name]  59 y.o. male  PRE-OPERATIVE DIAGNOSIS:  Acute type I aortic dissection  POST-OPERATIVE DIAGNOSIS:  Acute type I aortic dissection  PROCEDURE:   REPAIR OF ACUTE THORACIC AORTIC DISSECTION (30 mm GelWeave straight graft under hypothermic circulatory arrest)  Re suspension of native aortic valve   SURGEON:  [name], MD  ASSISTANT: Coral Ceo, PA-C  ANESTHESIA:   general  PATIENT CONDITION:  ICU - intubated and hemodynamically stable.  PRE-OPERATIVE WEIGHT: 77.1 kg  Both feet appear viable at end of case, very faint right dp doppler pulse, easly audible doppler signals left dp and pt   Will observe feet for now, warm patient, Case discussed with Dr Arbie Cookey prior to surgery will have vascular in am  [name] MD      [address] [place] [place] [address] Office [phone]   Beeper [phone]  10/07/2014 1:46 AM

## 2014-10-07 NOTE — Procedures (Signed)
Extubation Procedure Note  Patient Details:   Name: Victor [name] DOB: July 28, 1955 MRN: [id number]   Airway Documentation:     Evaluation  O2 sats: stable throughout Complications: No apparent complications Patient did tolerate procedure well. Bilateral Breath Sounds: Clear, Diminished   Yes  Patient tolerated rapid wean. NIF -20 and VC 0.75 L. Positive for cuff leak. Patient extubated to 3 Lpm nasal cannula. No signs of dyspnea or stridor. Patient instructed on the Incentive Spirometer, achieving 600 mL, three times. RN at bedside. Patient resting comfortably.   Ancil Boozer 10/07/2014, 11:51 AM

## 2014-10-08 ENCOUNTER — Encounter (HOSPITAL_COMMUNITY): Payer: Self-pay | Admitting: Vascular Surgery

## 2014-10-08 ENCOUNTER — Inpatient Hospital Stay (HOSPITAL_COMMUNITY): Payer: Self-pay

## 2014-10-08 LAB — PREPARE PLATELET PHERESIS
Unit division: 0
Unit division: 0

## 2014-10-08 LAB — GLUCOSE, CAPILLARY
Glucose-Capillary: 100 mg/dL — ABNORMAL HIGH (ref 65–99)
Glucose-Capillary: 104 mg/dL — ABNORMAL HIGH (ref 65–99)
Glucose-Capillary: 110 mg/dL — ABNORMAL HIGH (ref 65–99)
Glucose-Capillary: 111 mg/dL — ABNORMAL HIGH (ref 65–99)
Glucose-Capillary: 113 mg/dL — ABNORMAL HIGH (ref 65–99)
Glucose-Capillary: 78 mg/dL (ref 65–99)
Glucose-Capillary: 93 mg/dL (ref 65–99)

## 2014-10-08 LAB — POCT I-STAT 3, ART BLOOD GAS (G3+)
Acid-Base Excess: 1 mmol/L (ref 0.0–2.0)
Acid-base deficit: 1 mmol/L (ref 0.0–2.0)
Bicarbonate: 24.2 mEq/L — ABNORMAL HIGH (ref 20.0–24.0)
Bicarbonate: 24.1 mEq/L — ABNORMAL HIGH (ref 20.0–24.0)
Bicarbonate: 26.5 mEq/L — ABNORMAL HIGH (ref 20.0–24.0)
O2 Saturation: 86 %
O2 Saturation: 83 %
O2 Saturation: 91 %
Patient temperature: 37.5
Patient temperature: 97.9
Patient temperature: 98
TCO2: 25 mmol/L (ref 0–100)
TCO2: 25 mmol/L (ref 0–100)
TCO2: 28 mmol/L (ref 0–100)
pCO2 arterial: 39.4 mmHg (ref 35.0–45.0)
pCO2 arterial: 38.9 mmHg (ref 35.0–45.0)
pCO2 arterial: 43.2 mmHg (ref 35.0–45.0)
pH, Arterial: 7.399 (ref 7.350–7.450)
pH, Arterial: 7.399 (ref 7.350–7.450)
pH, Arterial: 7.394 (ref 7.350–7.450)
pO2, Arterial: 53 mmHg — ABNORMAL LOW (ref 80.0–100.0)
pO2, Arterial: 47 mmHg — ABNORMAL LOW (ref 80.0–100.0)
pO2, Arterial: 60 mmHg — ABNORMAL LOW (ref 80.0–100.0)

## 2014-10-08 LAB — COMPREHENSIVE METABOLIC PANEL
ALT: 69 U/L — ABNORMAL HIGH (ref 17–63)
AST: 138 U/L — ABNORMAL HIGH (ref 15–41)
Albumin: 3 g/dL — ABNORMAL LOW (ref 3.5–5.0)
Alkaline Phosphatase: 44 U/L (ref 38–126)
Anion gap: 8 (ref 5–15)
BUN: 16 mg/dL (ref 6–20)
CO2: 27 mmol/L (ref 22–32)
Calcium: 7.2 mg/dL — ABNORMAL LOW (ref 8.9–10.3)
Chloride: 104 mmol/L (ref 101–111)
Creatinine, Ser: 1.38 mg/dL — ABNORMAL HIGH (ref 0.61–1.24)
GFR calc Af Amer: >60 mL/min (ref >60)
GFR calc non Af Amer: 55 mL/min — ABNORMAL LOW (ref >60)
Glucose, Bld: 131 mg/dL — ABNORMAL HIGH (ref 65–99)
Potassium: 4 mmol/L (ref 3.5–5.1)
Sodium: 139 mmol/L (ref 135–145)
Total Bilirubin: 1.7 mg/dL — ABNORMAL HIGH (ref 0.3–1.2)
Total Protein: 5.5 g/dL — ABNORMAL LOW (ref 6.5–8.1)

## 2014-10-08 LAB — POCT I-STAT, CHEM 8
BUN: 21 mg/dL — ABNORMAL HIGH (ref 6–20)
Calcium, Ion: 1.02 mmol/L — ABNORMAL LOW (ref 1.12–1.23)
Chloride: 101 mmol/L (ref 101–111)
Creatinine, Ser: 1.3 mg/dL — ABNORMAL HIGH (ref 0.61–1.24)
Glucose, Bld: 107 mg/dL — ABNORMAL HIGH (ref 65–99)
HCT: 33 % — ABNORMAL LOW (ref 39.0–52.0)
Hemoglobin: 11.2 g/dL — ABNORMAL LOW (ref 13.0–17.0)
Potassium: 3.9 mmol/L (ref 3.5–5.1)
Sodium: 140 mmol/L (ref 135–145)
TCO2: 22 mmol/L (ref 0–100)

## 2014-10-08 LAB — CBC
HCT: 29.6 % — ABNORMAL LOW (ref 39.0–52.0)
HEMOGLOBIN: 9.9 g/dL — AB (ref 13.0–17.0)
MCH: 28.3 pg (ref 26.0–34.0)
MCHC: 33.4 g/dL (ref 30.0–36.0)
MCV: 84.6 fL (ref 78.0–100.0)
Platelets: 132 10*3/uL — ABNORMAL LOW (ref 150–400)
RBC: 3.5 MIL/uL — ABNORMAL LOW (ref 4.22–5.81)
RDW: 15.5 % (ref 11.5–15.5)
WBC: 15.7 10*3/uL — AB (ref 4.0–10.5)

## 2014-10-08 LAB — PREPARE CRYOPRECIPITATE
Unit division: 0
Unit division: 0

## 2014-10-08 LAB — PREPARE FRESH FROZEN PLASMA
Unit division: 0
Unit division: 0
Unit division: 0
Unit division: 0

## 2014-10-08 LAB — CREATININE, SERUM
Creatinine, Ser: 1.3 mg/dL — ABNORMAL HIGH (ref 0.61–1.24)
GFR calc non Af Amer: 59 mL/min — ABNORMAL LOW (ref >60)

## 2014-10-08 LAB — MAGNESIUM: Magnesium: 2.2 mg/dL (ref 1.7–2.4)

## 2014-10-08 MED ORDER — INSULIN ASPART 100 UNIT/ML ~~LOC~~ SOLN: 0.0000 [IU] | SUBCUTANEOUS | Status: DC

## 2014-10-08 MED ORDER — FUROSEMIDE 10 MG/ML IJ SOLN
40.0000 mg | Freq: Once | INTRAMUSCULAR | Status: AC
  Administered 2014-10-08: 40 mg via INTRAVENOUS
  Filled 2014-10-08: qty 4

## 2014-10-08 MED ORDER — FUROSEMIDE 10 MG/ML IJ SOLN
40.0000 mg | Freq: Once | INTRAMUSCULAR | Status: AC
Start: 2014-10-08 — End: 2014-10-08
  Administered 2014-10-08: 40 mg via INTRAVENOUS
  Filled 2014-10-08: qty 4

## 2014-10-08 MED ORDER — ENOXAPARIN SODIUM 30 MG/0.3ML ~~LOC~~ SOLN
30.0000 mg | SUBCUTANEOUS | Status: DC
  Administered 2014-10-08 – 2014-10-14 (×7): 30 mg via SUBCUTANEOUS
  Filled 2014-10-08 (×8): qty 0.3

## 2014-10-08 MED FILL — Heparin Sodium (Porcine) Inj 1000 Unit/ML: INTRAMUSCULAR | Qty: 30 | Status: AC

## 2014-10-08 MED FILL — Potassium Chloride Inj 2 mEq/ML: INTRAVENOUS | Qty: 40 | Status: AC

## 2014-10-08 MED FILL — Magnesium Sulfate Inj 50%: INTRAMUSCULAR | Qty: 10 | Status: AC

## 2014-10-08 NOTE — Progress Notes (Signed)
Anesthesiology Follow-up:  Awake and alert, neuro intact, as noted he underwent L-R Fem-Fem BPG last night. Now with palpable DP pulses bilaterally, requiring non-rebreather mask for low O2 Sats.  VS: T-37.6 BP 121/50 HR- 93 (SR) RR-22 O2 sat 92% on Non-rebreather mask  K-4.0 BUN/Cr 16/1.16 H/H 10.5/31.0 Platelets-153,000   CXR- bibasilar, atelectasis  Overall doing well, He was extubated following Type 1 aortic dissection repair 7 hours post-op and he was extubated in the OR last night following the Fem-Fem bypass. Needs to work on pulmonary toilet, monitoring renal function.  Victor [name]

## 2014-10-08 NOTE — Addendum Note (Signed)
Addendum  created 10/08/14 1116 by Kipp Brood, MD   Modules edited: Notes Section   Notes Section:  File: [id number]; Pend: [id number]; Beverely Low: [id number]; Pend: [id number]

## 2014-10-08 NOTE — Progress Notes (Signed)
Patient ID: Victor [name], male   DOB: 12-17-1955, 59 y.o.   MRN: [id number] EVENING ROUNDS NOTE :     [address]       [place] [address]             [phone]                 1 Day Post-Op Procedure(s) (LRB): LEFT  FEMORAL ARTERY -RIGHT FEMORAL ARTERY BYPASS GRAFT USING X 30 CM HEMASHIELD GOLD GRAFT (N/A)  Total Length of Stay:  LOS: 1 day  BP 93/50 mmHg  Pulse 95  Temp(Src) 98.7 F (37.1 C) (Oral)  Resp 9  Ht 5\' 10"  (1.778 m)  Wt 181 lb 7 oz (82.3 kg)  BMI 26.03 kg/m2  SpO2 95%  .Intake/Output      06/13 0701 - 06/14 0700 06/14 0701 - 06/15 0700   I.V. (mL/kg) 2235 (27.2) 406.9 (4.9)   Blood     IV Piggyback 250 50   Total Intake(mL/kg) 2485 (30.2) 456.9 (5.6)   Urine (mL/kg/hr) 2180 (1.1) 870 (1)   Blood 100 (0.1)    Chest Tube 460 (0.2) 20 (0)   Total Output 2740 890   Net -255 -433.1          . sodium chloride Stopped (10/08/14 0900)  . sodium chloride    . sodium chloride 20 mL/hr at 10/07/14 0200  . dexmedetomidine Stopped (10/07/14 1000)  . DOPamine 3 mcg/kg/min (10/08/14 1500)  . lactated ringers Stopped (10/07/14 0215)  . lactated ringers 20 mL/hr at 10/07/14 0215  . nitroGLYCERIN Stopped (10/07/14 0800)  . nitroPRUSSide    . phenylephrine (NEO-SYNEPHRINE) Adult infusion 20 mcg/min (10/08/14 1500)     Lab Results  Component Value Date   WBC 9.7 10/07/2014   HGB 10.5* 10/07/2014   HCT 31.0* 10/07/2014   PLT 153 10/07/2014   GLUCOSE 131* 10/08/2014   ALT 69* 10/08/2014   AST 138* 10/08/2014   NA 139 10/08/2014   K 4.0 10/08/2014   CL 104 10/08/2014   CREATININE 1.38* 10/08/2014   BUN 16 10/08/2014   CO2 27 10/08/2014   INR 1.59* 10/07/2014   Stable day, palpable pulse right leg  [name] MD  Beeper [phone] Office [phone] 10/08/2014 5:39 PM

## 2014-10-08 NOTE — Progress Notes (Signed)
Subjective: Interval History: none.. Extubated. Reports that he hurts all over.  Objective: Vital signs in last 24 hours: Temp:  [97 F (36.1 C)-100.4 F (38 C)] 99.7 F (37.6 C) (06/14 0600) Pulse Rate:  [82-104] 93 (06/14 0600) Resp:  [11-35] 19 (06/14 0600) BP: (89-133)/(45-62) 96/52 mmHg (06/14 0600) SpO2:  [87 %-100 %] 96 % (06/14 0600) Arterial Line BP: (86-136)/(40-73) 116/47 mmHg (06/14 0600) FiO2 (%):  [40 %-55 %] 55 % (06/14 0500) Weight:  [181 lb 7 oz (82.3 kg)] 181 lb 7 oz (82.3 kg) (06/14 0500)  Intake/Output from previous day: 06/13 0701 - 06/14 0700 In: 2418.2 [I.V.:2168.2; IV Piggyback:250] Out: 2705 [Urine:2145; Blood:100; Chest Tube:460] Intake/Output this shift: Total I/O In: 1078.1 [I.V.:878.1; IV Piggyback:200] Out: 1235 [Urine:1055; Chest Tube:180]  Tender over his femoral-femoral graft. Easily palpable graft pulse. 2+ dorsalis pedis pulses bilaterally. Able to move his right foot normally but reports numbness from his mid thigh distally.  Lab Results:  Recent Labs  10/07/14 0215 10/07/14 0600 10/07/14 2039  WBC 15.8* 9.7  --   HGB 12.1*  12.6* 10.5* 10.5*  HCT 36.4*  37.0* 31.6* 31.0*  PLT 183 153  --    BMET  Recent Labs  10/07/14 0600 10/07/14 2039 10/08/14 0445  NA 141 142 139  K 3.9 3.7 4.0  CL 108 102 104  CO2 26  --  27  GLUCOSE 111* 125* 131*  BUN CREATININE 1.17 1.20 1.38*  CALCIUM 7.3*  --  7.2*    Studies/Results: Dg Chest Port 1 View  10/07/2014   CLINICAL DATA:  Postoperative radiograph, for repair of aortic dissection. Subsequent encounter.  EXAM: PORTABLE CHEST - 1 VIEW  COMPARISON:  Chest radiograph and CTA of the chest performed 10/06/2014  FINDINGS: The patient's endotracheal tube is seen ending 3-4 cm above the carina. A right IJ Swan-Ganz catheter is noted ending about the main pulmonary outflow tract. An additional right IJ line is noted ending about the mid SVC. An enteric tube is noted ending about the  body of the stomach, with the side port about the gastroesophageal junction. Mediastinal drains are noted.  The lungs are mildly hypoexpanded. Mild vascular crowding and vascular congestion are seen. A small right pleural effusion is suspected. No pneumothorax is seen.  The cardiomediastinal silhouette is borderline normal in size. The patient is status post median sternotomy. Scattered postoperative pneumomediastinum is noted. No acute osseous abnormalities are identified.  IMPRESSION: 1. Tubes and lines as described above. 2. Enteric tube ends about the body of the stomach, though the side port is noted about the gastroesophageal junction. This could be advanced slightly, as deemed clinically appropriate. 3. Lungs mildly hypoexpanded. Vascular congestion and small right pleural effusion noted. 4. Scattered postoperative pneumomediastinum noted.   Electronically Signed   By: Roanna Raider M.D.   On: 10/07/2014 02:43   Dg Chest Port 1 View  10/06/2014   CLINICAL DATA:  Chest pain and shortness of breath today.  EXAM: PORTABLE CHEST - 1 VIEW  COMPARISON:  None.  FINDINGS: Upper limits normal heart size and fullness of the mediastinum may be technical.  There is no evidence of focal airspace disease, pulmonary edema, suspicious pulmonary nodule/mass, pleural effusion, or pneumothorax. No acute bony abnormalities are identified.  IMPRESSION: Upper limits normal heart size and mediastinal fullness, which may be technical. Recommend PA and lateral chest radiographs when clinically able.  No other significant abnormalities identified.   Electronically Signed   By: Tinnie Gens  Hu M.D.   On: 10/06/2014 16:59   Ct Angio Chest Aorta W/cm &/or Wo/cm  10/06/2014   CLINICAL DATA:  59 year old male with mid sternal chest pain and shortness of breath today, with some associated back pain.  EXAM: CT ANGIOGRAPHY CHEST, ABDOMEN AND PELVIS  TECHNIQUE: Multidetector CT imaging through the chest, abdomen and pelvis was performed  using the standard protocol during bolus administration of intravenous contrast. Multiplanar reconstructed images and MIPs were obtained and reviewed to evaluate the vascular anatomy.  CONTRAST:  100 mL of Omnipaque 350.  COMPARISON:  No priors.  FINDINGS: CTA CHEST FINDINGS  Mediastinum/Lymph Nodes: Precontrast images demonstrate no crescentic area of high attenuation associated with the wall of the thoracic aorta to suggest acute intramural hemorrhage. However, postcontrast CT images demonstrate a type A dissection of the thoracic aorta which continues into the abdominal aorta (ultimately extending into the right iliac vasculature as discussed below). The dissection begins in the proximal ascending aorta immediately above the sino-tubular junction, with the dissection flap coming in close proximity to the origin of the right coronary artery, without definite involvement of the RCA at this time. The dissection flap is well separated from the left coronary artery origin. There is no extension into the arch vessels at this time, and the arch vessels all arise from the true lumen. Normal three-vessel arch noted. Ascending thoracic aorta measures up to 3.5 cm in diameter. Mid arch measures up to 2.9 cm in diameter. The isthmus of the thoracic aorta is aneurysmal measuring up to 4.4 cm in diameter. Descending thoracic aorta measures 3.1 cm in diameter. No definite signs of active extravasation are identified in the mediastinum at this time. Heart size is normal. There is no significant pericardial fluid, thickening or pericardial calcification. No pathologically enlarged mediastinal or hilar lymph nodes. Several borderline enlarged mediastinal lymph nodes are nonspecific, presumably reactive. Esophagus is unremarkable in appearance. No axillary lymphadenopathy.  Lungs/Pleura: No pleural effusion. No acute consolidative airspace disease. No suspicious appearing pulmonary nodules or masses. Linear areas of subsegmental  atelectasis or scarring in the lower lobes of the lungs bilaterally and in the lateral segment of the right middle lobe. Mild paraseptal emphysema, most apparent the lung apices. No pneumothorax.  Musculoskeletal/Soft Tissues: There are no aggressive appearing lytic or blastic lesions noted in the visualized portions of the skeleton.  CTA ABDOMEN AND PELVIS FINDINGS  Hepatobiliary: No cystic or solid hepatic lesions. No intra or extrahepatic biliary ductal dilatation. Gallbladder is normal in appearance.  Pancreas: No pancreatic mass. No pancreatic ductal dilatation. No pancreatic or peripancreatic fluid or inflammatory changes.  Spleen: Unremarkable.  Adrenals/Urinary Tract: The right kidney enhances to a lesser degree than the left kidney, presumably secondary to the right renal artery arising from the false lumen which is less well opacified at the time images were acquired on this examination. The right renal artery is widely patent (the dissection flap does not extend into the right renal artery origin at this time). Kidneys are otherwise normal in appearance bilaterally. Bilateral adrenal glands are normal in appearance. No hydroureteronephrosis. Urinary bladder is normal in appearance.  Stomach/Bowel: Normal appearance of the stomach. No pathologic dilatation of small bowel or colon. Sigmoid colon is very redundant and extends all the way to the left upper quadrant of the abdomen. Numerous loops of small bowel extend into a very large left inguinal hernia.  Vascular/Lymphatic: The aortic dissection extends through the abdomen into the right iliac vasculature. The dissection flap appears to terminate at  or adjacent to the origin of the right internal iliac artery. At this time, the right external iliac artery appears completely occluded proximally, but is reconstituted distally presumably secondary to collateral flow. The right internal iliac artery still remains patent, perfused by the true lumen. As  discussed above, a single right renal artery arises from the false lumen, and appears widely patent. Both of the left renal arteries is arise from the true lumen and are widely patent. 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 This is well demonstrated on images 141-143 of series 601. The celiac axis is fed by the true lumen and is otherwise widely patent distally. The dissection flap does not appear to extend into the celiac axis at this time. The superior mesenteric artery is widely patent, and arises from the true lumen. In so arises from the true lumen is widely patent.  Reproductive: Prostate gland and seminal vesicles are unremarkable in appearance.  Other: Massive left inguinal hernia containing a large portion of the small bowel, without evidence of incarceration or obstruction at this time. No significant volume of ascites. No pneumoperitoneum.  Musculoskeletal: There are no aggressive appearing lytic or blastic lesions noted in the visualized portions of the skeleton.  Review of the MIP images confirms the above findings.  IMPRESSION: 1. Type A aortic dissection, as detailed above. 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 2. 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 3. Differential perfusion of the kidneys is not related to stenosis of the  right renal artery, but rather reflects differential opacification of the true lumen and false lumen of the dissection (right kidney perfuses via a single renal artery from the false lumen, while the left kidney is perfused via two arteries arising from the true lumen). 4. Massive left inguinal hernia containing multiple loops of small bowel. No associated incarceration or obstruction at this time. 5. Additional incidental findings, as above. These results were called by telephone at the time of interpretation on 10/06/2014 at 6:07 pm to Dr. Lyndal Pulley, who verbally acknowledged these results.   Electronically Signed   By: Trudie Reed M.D.   On: 10/06/2014 18:22   Ct Angio Abd/pel W/ And/or W/o  10/06/2014   CLINICAL DATA:  59 year old male with mid sternal chest pain and shortness of breath today, with some associated back pain.  EXAM: CT ANGIOGRAPHY CHEST, ABDOMEN AND PELVIS  TECHNIQUE: Multidetector CT imaging through the chest, abdomen and pelvis was performed using the standard protocol during bolus administration of intravenous contrast. Multiplanar reconstructed images and MIPs were obtained and reviewed to evaluate the vascular anatomy.  CONTRAST:  100 mL of Omnipaque 350.  COMPARISON:  No priors.  FINDINGS: CTA CHEST FINDINGS  Mediastinum/Lymph Nodes: Precontrast images demonstrate no crescentic area of high attenuation associated with the wall of the thoracic aorta to suggest acute intramural hemorrhage. However, postcontrast CT images demonstrate a type A  dissection of the thoracic aorta which continues into the abdominal aorta (ultimately extending into the right iliac vasculature as discussed below). The dissection begins in the proximal ascending aorta immediately above the sino-tubular junction, with the dissection flap coming in close proximity to the origin of the right coronary artery, without definite involvement of the RCA at this time. The dissection flap is well separated from the left  coronary artery origin. There is no extension into the arch vessels at this time, and the arch vessels all arise from the true lumen. Normal three-vessel arch noted. Ascending thoracic aorta measures up to 3.5 cm in diameter. Mid arch measures up to 2.9 cm in diameter. The isthmus of the thoracic aorta is aneurysmal measuring up to 4.4 cm in diameter. Descending thoracic aorta measures 3.1 cm in diameter. No definite signs of active extravasation are identified in the mediastinum at this time. Heart size is normal. There is no significant pericardial fluid, thickening or pericardial calcification. No pathologically enlarged mediastinal or hilar lymph nodes. Several borderline enlarged mediastinal lymph nodes are nonspecific, presumably reactive. Esophagus is unremarkable in appearance. No axillary lymphadenopathy.  Lungs/Pleura: No pleural effusion. No acute consolidative airspace disease. No suspicious appearing pulmonary nodules or masses. Linear areas of subsegmental atelectasis or scarring in the lower lobes of the lungs bilaterally and in the lateral segment of the right middle lobe. Mild paraseptal emphysema, most apparent the lung apices. No pneumothorax.  Musculoskeletal/Soft Tissues: There are no aggressive appearing lytic or blastic lesions noted in the visualized portions of the skeleton.  CTA ABDOMEN AND PELVIS FINDINGS  Hepatobiliary: No cystic or solid hepatic lesions. No intra or extrahepatic biliary ductal dilatation. Gallbladder is normal in appearance.  Pancreas: No pancreatic mass. No pancreatic ductal dilatation. No pancreatic or peripancreatic fluid or inflammatory changes.  Spleen: Unremarkable.  Adrenals/Urinary Tract: The right kidney enhances to a lesser degree than the left kidney, presumably secondary to the right renal artery arising from the false lumen which is less well opacified at the time images were acquired on this examination. The right renal artery is widely patent (the  dissection flap does not extend into the right renal artery origin at this time). Kidneys are otherwise normal in appearance bilaterally. Bilateral adrenal glands are normal in appearance. No hydroureteronephrosis. Urinary bladder is normal in appearance.  Stomach/Bowel: Normal appearance of the stomach. No pathologic dilatation of small bowel or colon. Sigmoid colon is very redundant and extends all the way to the left upper quadrant of the abdomen. Numerous loops of small bowel extend into a very large left inguinal hernia.  Vascular/Lymphatic: The aortic dissection extends through the abdomen into the right iliac vasculature. The dissection flap appears to terminate at or adjacent to the origin of the right internal iliac artery. At this time, the right external iliac artery appears completely occluded proximally, but is reconstituted distally presumably secondary to collateral flow. The right internal iliac artery still remains patent, perfused by the true lumen. As discussed above, a single right renal artery arises from the false lumen, and appears widely patent. Both of the left renal arteries is arise from the true lumen and are widely patent. 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 This is well demonstrated on images 141-143 of series 601. The celiac axis is fed by the true lumen and is otherwise widely patent distally. The dissection flap does not appear to extend into the celiac axis at  this time. The superior mesenteric artery is widely patent, and arises from the true lumen. In so arises from the true lumen is widely patent.  Reproductive: Prostate gland and seminal vesicles are unremarkable in appearance.  Other: Massive left inguinal hernia containing a large portion of the small bowel, without evidence of incarceration or obstruction at this time. No significant volume of ascites. No pneumoperitoneum.   Musculoskeletal: There are no aggressive appearing lytic or blastic lesions noted in the visualized portions of the skeleton.  Review of the MIP images confirms the above findings.  IMPRESSION: 1. Type A aortic dissection, as detailed above. 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 2. 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 3. Differential perfusion of the kidneys is not related to stenosis of the right renal artery, but rather reflects differential opacification of the true lumen and false lumen of the dissection (right kidney perfuses via a single renal artery from the false lumen, while the left kidney is perfused via two arteries arising from the true lumen). 4. Massive left inguinal hernia containing multiple loops of small bowel. No associated incarceration or obstruction at this time. 5. Additional incidental findings, as above. These results were called by telephone at the time of interpretation on 10/06/2014 at 6:07 pm to Dr. Lyndal Pulley, who verbally acknowledged these results.   Electronically Signed   By: Trudie Reed M.D.   On: 10/06/2014 18:22   Anti-infectives: Anti-infectives    Start     Dose/Rate Route Frequency Ordered Stop   10/07/14 1615  cefUROXime (ZINACEF) 1.5 g in dextrose 5 % 50 mL IVPB     1.5 g 100 mL/hr over 30 Minutes Intravenous To Surgery 10/07/14 1606 10/08/14 1615   10/07/14 0800  cefUROXime  (ZINACEF) 1.5 g in dextrose 5 % 50 mL IVPB     1.5 g 100 mL/hr over 30 Minutes Intravenous Every 12 hours 10/07/14 0211 10/09/14 0759   10/07/14 0600  vancomycin (VANCOCIN) IVPB 1000 mg/200 mL premix     1,000 mg 200 mL/hr over 60 Minutes Intravenous  Once 10/07/14 0211 10/07/14 0713   10/06/14 1830  vancomycin (VANCOCIN) 1,250 mg in sodium chloride 0.9 % 250 mL IVPB     1,250 mg 166.7 mL/hr over 90 Minutes Intravenous To Surgery 10/06/14 1828 10/06/14 1942   10/06/14 1830  cefUROXime (ZINACEF) 1.5 g in dextrose 5 % 50 mL IVPB     1.5 g 100 mL/hr over 30 Minutes Intravenous To Surgery 10/06/14 1828 10/06/14 1944   10/06/14 1830  cefUROXime (ZINACEF) 750 mg in dextrose 5 % 50 mL IVPB  Status:  Discontinued     750 mg 100 mL/hr over 30 Minutes Intravenous To Surgery 10/06/14 1824 10/07/14 0203      Assessment/Plan: s/p Procedure(s): LEFT  FEMORAL ARTERY -RIGHT FEMORAL ARTERY BYPASS GRAFT USING X 30 CM HEMASHIELD GOLD GRAFT (N/A) Normal arterial flow bilaterally lower extremities. Persistent numbness later to ischemia. Mobilize per Dr. Tyrone Sage  LOS: 1 day   Gretta Began 10/08/2014, 6:33 AM

## 2014-10-08 NOTE — Op Note (Signed)
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  SURGICAL PROCEDURE:  Repair of ascending aortic dissection with replacement of ascending aorta and resuspension of the aortic valve with cardiopulmonary bypass and hypothermic circulatory arrest.  SURGEON:  Sheliah Plane, MD  FIRST ASSISTANT:  Coral Ceo, P.A.  BRIEF HISTORY:  The patient is a 59 year old male with no previous medical history.  No primary care physician.  No family history of aortic dissection.  The patient presented to the [facility] Emergency Room after sudden onset of anterior chest pain radiating to the back while playing video games.  CT scan of the chest and abdomen confirmed type 1 aortic dissection originating just at the takeoff of the right coronary ostium and extending into the right iliac artery.  On exam, the patient did not have aortic insufficiency.  He also complained of numbness in his right leg, but with motor function.  He had a large left inguinal hernia.  Dopplerable pulse signals were evident in the left leg, but not in the right.  He had palpable radial pulses bilaterally, they were equal.  The patient was taken to the operating room directly from the emergency room for repair of aortic dissection.  Risks and options were discussed with he and his mother and sister in detail.  DESCRIPTION OF PROCEDURE:   The patient underwent general endotracheal anesthesia, Dr. Barbara Cower placed an arterial line in the right radial and Swan-Ganz catheter.  TEE probe was placed confirming the diagnosis. There was no aortic insufficiency on TEE.  Overall, ventricular function was preserved.  The skin of the chest and legs was prepped with Betadine and draped in usual sterile manner.  Appropriate time-out was performed. We proceeded with incision in the right infraclavicular area separating the fibers of the pectoralis major dividing the pectoral minor and encircling the right axillary artery with blue tapes for cannulation. Median sternotomy was performed.  Pericardium was opened.  There was no blood in the pericardium.  There was obvious dissection as previously confirmed by TEE.  The patient was systemically heparinized.  Vascular clamps were placed on the axillary artery, proximally and distally, and the vessel was opened and 8-mm Gelweave graft was sewn to the axillary artery with a running 5-0 Prolene.  Air was evacuated from this graft, connector was sewn to the graft and connected to the arterial pump circuit after de-airing.  We then proceeded with placement of a retrograde cardioplegia catheter and a dual stage venous catheter.  The patient was placed on cardiopulmonary bypass 2.4 L/min/m2, and his body temperature was cooled to 18 degrees.  A right superior pulmonary vein vent was placed.  When we reached 18 degrees, circulatory arrest was commenced.  The patient with the head in the down position, a clamp was placed on the innominate artery and antegrade cerebral perfusion was started.  The aorta was opened in the mid portion with we could easily see blood returning from the left carotid artery.  We then proceeded with trimming the distal portion of the ascending aorta.  The dissection plane at the area of distal anastomosis was glued with BioGlue.  Felt strip was placed around the aorta and a distal  anastomosis with 30-mm Gelweave straight tube graft was performed.  Graft was trimmed to the appropriate length.  As we completed this anastomosis, CoSeal was placed around the suture line.  The aorta was allowed to passively fill as we removed the clamp from the innominate. With the clamp on the graft, circulatory arrest time was total of 26 minutes, antegrade cerebral perfusion was a total of 24 minutes.  We then began rewarming the patient.  Turned our attention to the proximal anastomosis.  The annulus was not dilated; and overall, the patient's aorta was not enlarged.  We trimmed the proximal aorta to just at the level of the origin of the dissection.  This preserved the right and left coronary ostium which did not appear to have any obstruction to reinforce this area because of the close vicinity to the right coronary ostium.  A felt strip was placed along the inside of the aorta and a second one around the outside, and a horizontal mattress suture was placed sewing the 2 together sandwiching the area of the aorta.  This also re-suspended and strengthened the commissures of the aortic valve. With this completed and without impingement of the coronary arteries, the proximal anastomosis was then completed with a running 3-0 Prolene. Prior to complete closure, the heart was allowed to passively fill and de-air.  During the procedure, cold retrograde cardioplegia had been intermittently given.  The cross-clamp was then removed with a total cross-clamp time 39 minutes.  The patient's body temperature rewarmed to 37 degrees, he did require electrical defibrillation to return to a sinus rhythm.  Atrial and ventricular pacing wires were applied.  The retrograde cardioplegia catheter was removed.  TEE showed good functioning of both the inferior wall of the heart and overall ventricular function.  In addition, the aortic valve was without aortic insufficiency, right superior pulmonary vein  vent was removed, and the patient was then ventilated and weaned from cardiopulmonary bypass without difficulty with a pump time of 133 minutes.  Protamine sulfate was administered.  The venous catheter was removed.  The graft was sewn to the axillary artery was then divided with a vascular stapler. Because of low platelet count, coagulopathy, and low fibrinogen level, the patient was given 2 bags of platelets and fresh frozen and cryoprecipitate with the operative field hemostatic.  Pericardium was loosely reapproximated.  Two Blake drains were left in the mediastinum. The sternum was closed with #6 stainless steel wire.  Fascia was closed with interrupted 0 Vicryl, running 3-0 Vicryl subcutaneous tissue, 3-0 subcuticular stitch in the skin edges.  In a similar fashion, the right axillary artery was closed.  The muscle layers with interrupted 0 Vicryl, running 3-0 Vicryl subcutaneous tissue and 3-0 subcuticular stitch.  Dressings were applied.  Sponge and needle count was reported as correct at the completion of the procedure.  The patient tolerated the procedure without obvious complication.  At  the completion of the procedure, we checked the patient's lower extremities, although he was still in cool.  Both feet appeared viable, though there were faint dopplerable pulses in the left, but not in the right.  At this point, we decided to continue to observe the vascular situation with the right leg.  The patient was transferred to the Surgical Intensive Care Unit for further postoperative care.  Total pump time 133 minutes.     Sheliah Plane, MD          Sheliah Plane, MD     EG/MEDQ  D:  10/08/2014  T:  10/08/2014  Job:  [id number]

## 2014-10-08 NOTE — Progress Notes (Signed)
Pt's O2 sats 87-91% on NRB, pt taking shallow breaths, getting 500 on IS.  ABG results as follows pH 7.39 pCO2 38.9 pO2 47 HCO3 24.1 sO2 83.  UOP 15-30cc/hr.  Dr. Tyrone Sage notified of above, orders received for bipap, lasix and follow up ABG.  Respiratory called to start bipap.  Will continue to monitor.  Roselie Awkward, RN

## 2014-10-08 NOTE — Op Note (Signed)
Victor [name], Victor [name]               ACCOUNT NO.:  [id number]  MEDICAL RECORD NO.:  [id number]  LOCATION:  2S11C                        FACILITY:  [facility]  PHYSICIAN:  Sheliah Plane, MD    DATE OF BIRTH:  March 21, 1956  DATE OF PROCEDURE:  10/06/2014 DATE OF DISCHARGE:                              OPERATIVE REPORT   ADDENDUM:  With the clamp on the graft, circulatory arrest time was total of 26 minutes, antegrade cerebral perfusion was a total of 24 minutes.  We then began rewarming the patient.  Turned our attention to the proximal anastomosis.  The annulus was not dilated; and overall, the patient's aorta was not enlarged.  We trimmed the proximal aorta to just at the level of the origin of the dissection.  This preserved the right and left coronary ostium which did not appear to have any obstruction to reinforce this area because of the close vicinity to the right coronary ostium.  A felt strip was placed along the inside of the aorta and a second one around the outside, and a horizontal mattress suture was placed sewing the 2 together sandwiching the area of the aorta.  This also re-suspended and strengthened the commissures of the aortic valve. With this completed and without impingement of the coronary arteries, the proximal anastomosis was then completed with a running 3-0 Prolene. Prior to complete closure, the heart was allowed to passively fill and de-air.  During the procedure, cold retrograde cardioplegia had been intermittently given.  The cross-clamp was then removed with a total cross-clamp time 39 minutes.  The patient's body temperature rewarmed to 37 degrees, he did require electrical defibrillation to return to a sinus rhythm.  Atrial and ventricular pacing wires were applied.  The retrograde cardioplegia catheter was removed.  TEE showed good functioning of both the inferior wall of the heart and overall ventricular function.  In addition, the aortic valve was without  aortic insufficiency, right superior pulmonary vein vent was removed, and the patient was then ventilated and weaned from cardiopulmonary bypass without difficulty with a pump time of 133 minutes.  Protamine sulfate was administered.  The venous catheter was removed.  The graft was sewn to the axillary artery was then divided with a vascular stapler. Because of low platelet count, coagulopathy, and low fibrinogen level, the patient was given 2 bags of platelets and fresh frozen and cryoprecipitate with the operative field hemostatic.  Pericardium was loosely reapproximated.  Two Blake drains were left in the mediastinum. The sternum was closed with #6 stainless steel wire.  Fascia was closed with interrupted 0 Vicryl, running 3-0 Vicryl subcutaneous tissue, 3-0 subcuticular stitch in the skin edges.  In a similar fashion, the right axillary artery was closed.  The muscle layers with interrupted 0 Vicryl, running 3-0 Vicryl subcutaneous tissue and 3-0 subcuticular stitch.  Dressings were applied.  Sponge and needle count was reported as correct at the completion of the procedure.  The patient tolerated the procedure without obvious complication.  At the completion of the procedure, we checked the patient's lower extremities, although he was still in cool.  Both feet appeared viable, though there were faint dopplerable pulses in the left, but  not in the right.  At this point, we decided to continue to observe the vascular situation with the right leg.  The patient was transferred to the Surgical Intensive Care Unit for further postoperative care.  Total pump time 133 minutes.     Sheliah Plane, MD    EG/MEDQ  D:  10/08/2014  T:  10/08/2014  Job:  [id number]

## 2014-10-08 NOTE — Progress Notes (Signed)
Patient ID: Victor [name], male   DOB: 12-27-1955, 59 y.o.   MRN: [id number] TCTS DAILY ICU PROGRESS NOTE                   [address]            [place] [id number]          [id number]   1 Day Post-Op Procedure(s) (LRB): LEFT  FEMORAL ARTERY -RIGHT FEMORAL ARTERY BYPASS GRAFT USING X 30 CM HEMASHIELD GOLD GRAFT (N/A)  Past Surgical History  Procedure Laterality Date  . Thoracic aortic aneurysm repair N/A 10/06/2014    Procedure: THORACIC ASCENDING ANEURYSM REPAIR (AAA);  Surgeon: [name], MD;  Location: [facility] OR;  Service: Open Heart Surgery;  Laterality: N/A;  hyportermia circulatory arrest and resuspension of aortic valve   Total Length of Stay:  LOS: 1 day   Subjective: Awake and alert , right leg hip feels better, still some residual numbess  Objective: Vital signs in last 24 hours: Temp:  [97 F (36.1 C)-100.4 F (38 C)] 99.1 F (37.3 C) (06/14 0700) Pulse Rate:  [82-104] 91 (06/14 0700) Cardiac Rhythm:  [-] Normal sinus rhythm (06/13 2000) Resp:  [11-35] 23 (06/14 0700) BP: (89-133)/(45-62) 101/52 mmHg (06/14 0700) SpO2:  [87 %-100 %] 95 % (06/14 0700) Arterial Line BP: (86-136)/(40-73) 110/49 mmHg (06/14 0700) FiO2 (%):  [40 %-55 %] 55 % (06/14 0500) Weight:  [181 lb 7 oz (82.3 kg)] 181 lb 7 oz (82.3 kg) (06/14 0500)  Filed Weights   10/06/14 1859 10/07/14 0500 10/08/14 0500  Weight: 170 lb (77.111 kg) 178 lb (80.74 kg) 181 lb 7 oz (82.3 kg)    Weight change: 11 lb 7 oz (5.189 kg)   Hemodynamic parameters for last 24 hours: PAP: (21-36)/(11-31) 21/14 mmHg CO:  [3.1 L/min-4.8 L/min] 4.5 L/min CI:  [1.6 L/min/m2-2.5 L/min/m2] 2.3 L/min/m2  Intake/Output from previous day: 06/13 0701 - 06/14 0700 In: 2485 [I.V.:2235; IV Piggyback:250] Out: 2740 [Urine:2180; Blood:100; Chest Tube:460]  Intake/Output this shift:    Current Meds: Scheduled Meds: . acetaminophen  1,000 mg Oral 4 times per day   Or  . acetaminophen (TYLENOL)  oral liquid 160 mg/5 mL  1,000 mg Per Tube 4 times per day  . antiseptic oral rinse  7 mL Mouth Rinse BID  . aspirin EC  325 mg Oral Daily   Or  . aspirin  324 mg Per Tube Daily  . bisacodyl  10 mg Oral Daily   Or  . bisacodyl  10 mg Rectal Daily  . cefUROXime (ZINACEF)  IV  1.5 g Intravenous Q12H  . cefUROXime (ZINACEF)  IV  1.5 g Intravenous To OR  . docusate sodium  200 mg Oral Daily  . insulin aspart  0-24 Units Subcutaneous 6 times per day  . metoprolol tartrate  12.5 mg Oral BID   Or  . metoprolol tartrate  12.5 mg Per Tube BID  . pantoprazole  40 mg Oral Daily  . sodium chloride  3 mL Intravenous Q12H   Continuous Infusions: . sodium chloride 20 mL/hr at 10/07/14 0200  . sodium chloride    . sodium chloride 20 mL/hr at 10/07/14 0200  . dexmedetomidine Stopped (10/07/14 1000)  . DOPamine 3 mcg/kg/min (10/07/14 2000)  . lactated ringers Stopped (10/07/14 0215)  . lactated ringers 20 mL/hr at 10/07/14 0215  . nitroGLYCERIN Stopped (10/07/14 0800)  . nitroPRUSSide    . phenylephrine (NEO-SYNEPHRINE) Adult infusion 30  mcg/min (10/08/14 0545)   PRN Meds:.sodium chloride, albumin human, metoprolol, morphine injection, ondansetron (ZOFRAN) IV, oxyCODONE, sodium chloride, traMADol  General appearance: alert, cooperative and no distress Neurologic: intact Heart: regular rate and rhythm, S1, S2 normal, no murmur, click, rub or gallop Lungs: diminished breath sounds bibasilar Abdomen: soft, non-tender; bowel sounds normal; no masses,  no organomegaly Extremities: palpable dp , pt pulses bilaterial Wound: sternum stable  Lab Results: CBC: Recent Labs  10/07/14 0215 10/07/14 0600 10/07/14 2039  WBC 15.8* 9.7  --   HGB 12.1*  12.6* 10.5* 10.5*  HCT 36.4*  37.0* 31.6* 31.0*  PLT 183 153  --    BMET:  Recent Labs  10/07/14 0600 10/07/14 2039 10/08/14 0445  NA 141 142 139  K 3.9 3.7 4.0  CL 108 102 104  CO2 26  --  27  GLUCOSE 111* 125* 131*  BUN CREATININE 1.17 1.20 1.38*  CALCIUM 7.3*  --  7.2*    PT/INR:  Recent Labs  10/07/14 0215  LABPROT 19.0*  INR 1.59*   Radiology: No results found.   Assessment/Plan: S/P Procedure(s) (LRB): LEFT  FEMORAL ARTERY -RIGHT FEMORAL ARTERY BYPASS GRAFT USING X 30 CM HEMASHIELD GOLD GRAFT (N/A) Mobilize Diuresis d/c tubes/lines Continue foley due to strict I&O, patient in ICU and urinary output monitoring See progression orders Expected Acute  Blood - loss Anemia Cr still below admission level   [name] 10/08/2014 7:37 AM

## 2014-10-09 ENCOUNTER — Inpatient Hospital Stay (HOSPITAL_COMMUNITY): Payer: Self-pay

## 2014-10-09 DIAGNOSIS — J189 Pneumonia, unspecified organism: Secondary | ICD-10-CM

## 2014-10-09 DIAGNOSIS — I998 Other disorder of circulatory system: Secondary | ICD-10-CM | POA: Diagnosis not present

## 2014-10-09 DIAGNOSIS — Y95 Nosocomial condition: Secondary | ICD-10-CM

## 2014-10-09 DIAGNOSIS — J9601 Acute respiratory failure with hypoxia: Secondary | ICD-10-CM

## 2014-10-09 LAB — TYPE AND SCREEN
ABO/RH(D): O POS
Antibody Screen: NEGATIVE
UNIT DIVISION: 0
UNIT DIVISION: 0
UNIT DIVISION: 0
UNIT DIVISION: 0
Unit division: 0
Unit division: 0
Unit division: 0
Unit division: 0

## 2014-10-09 LAB — CBC
HCT: 30.4 % — ABNORMAL LOW (ref 39.0–52.0)
Hemoglobin: 10 g/dL — ABNORMAL LOW (ref 13.0–17.0)
MCH: 27.9 pg (ref 26.0–34.0)
MCHC: 32.9 g/dL (ref 30.0–36.0)
MCV: 84.9 fL (ref 78.0–100.0)
Platelets: 138 10*3/uL — ABNORMAL LOW (ref 150–400)
RBC: 3.58 MIL/uL — ABNORMAL LOW (ref 4.22–5.81)
RDW: 15.3 % (ref 11.5–15.5)
WBC: 18 10*3/uL — ABNORMAL HIGH (ref 4.0–10.5)

## 2014-10-09 LAB — GLUCOSE, CAPILLARY
Glucose-Capillary: 95 mg/dL (ref 65–99)
Glucose-Capillary: 87 mg/dL (ref 65–99)
Glucose-Capillary: 87 mg/dL (ref 65–99)
Glucose-Capillary: 99 mg/dL (ref 65–99)
Glucose-Capillary: 89 mg/dL (ref 65–99)

## 2014-10-09 LAB — BASIC METABOLIC PANEL
Anion gap: 8 (ref 5–15)
BUN: 22 mg/dL — ABNORMAL HIGH (ref 6–20)
CO2: 27 mmol/L (ref 22–32)
Calcium: 7.3 mg/dL — ABNORMAL LOW (ref 8.9–10.3)
Chloride: 101 mmol/L (ref 101–111)
Creatinine, Ser: 1.24 mg/dL (ref 0.61–1.24)
GFR calc Af Amer: >60 mL/min (ref >60)
GFR calc non Af Amer: >60 mL/min (ref >60)
Glucose, Bld: 110 mg/dL — ABNORMAL HIGH (ref 65–99)
Potassium: 3.9 mmol/L (ref 3.5–5.1)
Sodium: 136 mmol/L (ref 135–145)

## 2014-10-09 LAB — PROCALCITONIN: PROCALCITONIN: 0.96 ng/mL

## 2014-10-09 MED ORDER — PIPERACILLIN-TAZOBACTAM 3.375 G IVPB
3.3750 g | Freq: Three times a day (TID) | INTRAVENOUS | Status: DC
  Administered 2014-10-09 – 2014-10-15 (×18): 3.375 g via INTRAVENOUS
  Filled 2014-10-09 (×21): qty 50

## 2014-10-09 MED ORDER — LEVALBUTEROL HCL 0.63 MG/3ML IN NEBU: 0.6300 mg | INHALATION_SOLUTION | RESPIRATORY_TRACT | Status: DC | PRN

## 2014-10-09 MED ORDER — VANCOMYCIN HCL 10 G IV SOLR
1500.0000 mg | Freq: Once | INTRAVENOUS | Status: AC
  Administered 2014-10-09: 1500 mg via INTRAVENOUS
  Filled 2014-10-09: qty 1500

## 2014-10-09 MED ORDER — CETYLPYRIDINIUM CHLORIDE 0.05 % MT LIQD: 7.0000 mL | Freq: Two times a day (BID) | OROMUCOSAL | Status: DC

## 2014-10-09 MED ORDER — CHLORHEXIDINE GLUCONATE 0.12 % MT SOLN
15.0000 mL | Freq: Two times a day (BID) | OROMUCOSAL | Status: DC
  Administered 2014-10-09 – 2014-10-10 (×2): 15 mL via OROMUCOSAL
  Filled 2014-10-09: qty 15

## 2014-10-09 MED ORDER — FUROSEMIDE 10 MG/ML IJ SOLN
40.0000 mg | Freq: Once | INTRAMUSCULAR | Status: AC
  Administered 2014-10-09: 40 mg via INTRAVENOUS
  Filled 2014-10-09: qty 4

## 2014-10-09 MED ORDER — VANCOMYCIN HCL IN DEXTROSE 1-5 GM/200ML-% IV SOLN
1000.0000 mg | Freq: Two times a day (BID) | INTRAVENOUS | Status: DC
  Administered 2014-10-09 – 2014-10-14 (×10): 1000 mg via INTRAVENOUS
  Filled 2014-10-09 (×11): qty 200

## 2014-10-09 MED ORDER — POTASSIUM CHLORIDE 10 MEQ/50ML IV SOLN
10.0000 meq | INTRAVENOUS | Status: AC
  Administered 2014-10-09 (×2): 10 meq via INTRAVENOUS

## 2014-10-09 NOTE — Progress Notes (Signed)
Patient ID: Victor [name], male   DOB: [birth year], 59 y.o.   MRN: [id number]  SICU Evening Rounds:  Hemodynamically stable  Urine output good  Was on bipap yesterday, NRB this am, then ventimask. CXR this am shows increased patchy infiltrate in left lung. On vanc and Zosyn.  Will give a dose of lasix this pm.

## 2014-10-09 NOTE — Progress Notes (Addendum)
[facility]  Subjective  - States his leg feel much better.   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  COAG Lab Results  Component Value Date   INR 1.59* 10/07/2014   INR 2.76* 10/06/2014   INR 1.29 10/06/2014   No results found for: PTT    I have examined the patient, reviewed and agree with above.Numbness resolved.  2+dp pulse Peri Maris, MD 10/09/2014 4:53 PM

## 2014-10-09 NOTE — Evaluation (Signed)
Physical Therapy Evaluation Patient Details Name: Victor [name] MRN: [id number] DOB: 05-17-1955 Today's Date: 10/09/2014   History of Present Illness  59 y.o. male s/p THORACIC ASCENDING ANEURYSM REPAIR , and LEFT FEMORAL ARTERY -RIGHT FEMORAL ARTERY BYPASS GRAFT.   Clinical Impression  Patient is s/p above procedure, presenting with functional limitations due to the deficits listed below (see PT Problem List). Patient will benefit from skilled PT to increase their independence and safety with mobility to allow discharge to the venue listed below. Min assist +2 for transfer out of bed to chair today, tolerating 3 minutes of standing with vitals remaining stable. Reports sensation returning to RLE. Hopeful that patient will continue to progress well to a supervision level for d/c home. Will continue to monitor and update recommendations for d/c as appropriate.     Follow Up Recommendations Home health PT;Supervision/Assistance - 24 hour (Pending progress.) -may require higher level care if slow to recover.    Equipment Recommendations  Rolling walker with 5" wheels    Recommendations for Other Services OT consult     Precautions / Restrictions Precautions Precautions: Sternal Precaution Comments: monitor sats Restrictions Weight Bearing Restrictions: Yes Other Position/Activity Restrictions: sternal precautions      Mobility  Bed Mobility Overal bed mobility: Needs Assistance;+2 for physical assistance Bed Mobility: Rolling;Sidelying to Sit Rolling: Min assist Sidelying to sit: Min assist;+2 for safety/equipment;HOB elevated       General bed mobility comments: min assist for truncal and LE support. VC for technique and to maintain sternal precautions. +2 for equipment management  Transfers Overall transfer level: Needs assistance Equipment used: 1 person hand held assist Transfers: Sit to/from Stand;Stand Pivot Transfers Sit to Stand: Min assist;+2  safety/equipment Stand pivot transfers: Min assist;+2 safety/equipment       General transfer comment: Min assist for boost and balance to stand from lowest bed setting. +2 for safety and equipment management of lines/leads. VC for technique, uses rocking momentum to rise from bed. Cues for sequencing and min assist for balance with pivot transfer to chair.  Ambulation/Gait                Stairs            Wheelchair Mobility    Modified Rankin (Stroke Patients Only)       Balance Overall balance assessment: Needs assistance Sitting-balance support: No upper extremity supported;Feet supported Sitting balance-Leahy Scale: Fair     Standing balance support: No upper extremity supported Standing balance-Leahy Scale: Fair Standing balance comment: Able to stand x3 minutes before fatigued and needed to sit. Tolerated standing marches with fair foot clearance. Min guard for balance in standing.                             Pertinent Vitals/Pain Pain Assessment: Faces Faces Pain Scale: Hurts even more Pain Location: abdomen Pain Descriptors / Indicators: Grimacing;Guarding Pain Intervention(s): Monitored during session;Repositioned;Limited activity within patient's tolerance    Home Living Family/patient expects to be discharged to:: Private residence Living Arrangements: Parent Available Help at Discharge: Family;Available 24 hours/day (Plans to stay with parents/nephews/nieces at d/c) Type of Home: House Home Access: Stairs to enter Entrance Stairs-Rails: Left Entrance Stairs-Number of Steps: 8 Home Layout: One level Home Equipment: None      Prior Function Level of Independence: Independent               Hand Dominance  Extremity/Trunk Assessment   Upper Extremity Assessment: Defer to OT evaluation           Lower Extremity Assessment: Generalized weakness (reports sensation in RLE is returning)         Communication    Communication: No difficulties  Cognition Arousal/Alertness: Awake/alert Behavior During Therapy: WFL for tasks assessed/performed Overall Cognitive Status: Within Functional Limits for tasks assessed                      General Comments General comments (skin integrity, edema, etc.): At rest: HR 96, BP 115/676, SpO2 93% on BiPAP.  After working with therapy HR 100, BP 118/60, SpO2 96% on BiPAP.    Exercises General Exercises - Lower Extremity Ankle Circles/Pumps: AROM;Both;10 reps;Seated Long Arc Quad: AAROM;Both;Strengthening;10 reps;Seated Hip Flexion/Marching: Strengthening;Both;10 reps;Standing      Assessment/Plan    PT Assessment Patient needs continued PT services  PT Diagnosis Difficulty walking;Generalized weakness;Acute pain   PT Problem List Decreased strength;Decreased range of motion;Decreased activity tolerance;Decreased balance;Decreased mobility;Decreased knowledge of use of DME;Decreased knowledge of precautions;Cardiopulmonary status limiting activity;Impaired sensation;Pain  PT Treatment Interventions DME instruction;Gait training;Stair training;Functional mobility training;Therapeutic activities;Therapeutic exercise;Balance training;Neuromuscular re-education;Patient/family education;Modalities   PT Goals (Current goals can be found in the Care Plan section) Acute Rehab PT Goals Patient Stated Goal: Go home PT Goal Formulation: With patient Time For Goal Achievement: 10/23/14 Potential to Achieve Goals: Good    Frequency Min 3X/week   Barriers to discharge        Co-evaluation               End of Session Equipment Utilized During Treatment: Gait belt;Oxygen Activity Tolerance: Patient limited by fatigue Patient left: in chair;with call bell/phone within reach Nurse Communication: Mobility status         Time: 1610-9604 PT Time Calculation (min) (ACUTE ONLY): 26 min   Charges:   PT Evaluation $Initial PT Evaluation Tier I: 1  Procedure PT Treatments $Therapeutic Activity: 8-22 mins   PT G Codes:        Berton Mount 10/09/2014, 10:31 AM  Charlsie Merles, PT [phone]

## 2014-10-09 NOTE — Consult Note (Signed)
CONSULTATION NOTE  Reason for Consult: Ascending aortic dissection, hypoxia, dyspnea  Requesting Physician: Dr. Servando Snare  Cardiologist: None (NEW)  HPI: This is a 59 y.o. male with an no past medical history other than a history of smoking and polysubstance abuse and not on medications prior to admission. He apparently was playing video games and developed acute onset chest pain. CT scan indicated acute dissection of the ascending aortic aneurysm (Stanford type A). He went to surgery for repair on 10/07/2014. He had a 30 mm Gelweave straight graft placed with resuspension of the native aortic valve by Dr. Servando Snare. He also developed right leg ischemia secondary to aortic dissection and had right femoral-femoral bypass.   He developed hypoxia and increased work of breathing today and was placed on BiPAP. Critical care medicine and cardiology were consulted to assist in management.  On my arrival he was on NRB mask and reported that he was breathing "fine" - O2 saturation was 96%. BP was 867'Y systolic, not on pressors.  PMHx:  History reviewed. No pertinent past medical history. Past Surgical History  Procedure Laterality Date  . Thoracic aortic aneurysm repair N/A 10/06/2014    Procedure: THORACIC ASCENDING ANEURYSM REPAIR (AAA);  Surgeon: Grace Isaac, MD;  Location: [place];  Service: Open Heart Surgery;  Laterality: N/A;  hyportermia circulatory arrest and resuspension of aortic valve  . Femoral-femoral bypass graft N/A 10/07/2014    Procedure: LEFT  FEMORAL ARTERY -RIGHT FEMORAL ARTERY BYPASS GRAFT USING 8MM X 30 CM HEMASHIELD GOLD GRAFT;  Surgeon: Rosetta Posner, MD;  Location: [facility] OR;  Service: Vascular;  Laterality: N/A;    FAMHx: No family history on file. unable to obtain from the patient.  SOCHx:  reports that he has been smoking Cigarettes.  He does not have any smokeless tobacco history on file. He reports that he does not drink alcohol or use illicit  drugs.  ALLERGIES: No Known Allergies  ROS: Review of systems not obtained due to patient factors.  HOME MEDICATIONS: Prescriptions prior to admission  Medication Sig Dispense Refill Last Dose  . acetaminophen (TYLENOL) 500 MG tablet Take 500 mg by mouth every 6 (six) hours as needed (pain).   several months ago    HOSPITAL MEDICATIONS: I have reviewed the patient's current medications.  VITALS: Blood pressure 124/56, pulse 93, temperature 98 F (36.7 C), temperature source Oral, resp. rate 14, height _0  (1.778 m), weight 178 lb 9.2 oz (81 kg), SpO2 93 %.  PHYSICAL EXAM: General appearance: alert, mild distress and sitting in chair on NRB mask Neck: no carotid bruit and no JVD Lungs: diminished breath sounds bibasilar Heart: regular rate and rhythm, S1, S2 normal, no murmur, click, rub or gallop Abdomen: soft, non-tender; bowel sounds normal; no masses,  no organomegaly Extremities: extremities normal, atraumatic, no cyanosis or edema Pulses: 2+ and symmetric Skin: Skin color, texture, turgor normal. No rashes or lesions Neurologic: Mental status: Alert, oriented, thought content appropriate Psych: appears in mild pain  LABS: Results for orders placed or performed during the hospital encounter of 10/06/14 (from the past 48 hour(s))  I-STAT 3, arterial blood gas (G3+)     Status: Abnormal   Collection Time: 10/07/14 11:24 AM  Result Value Ref Range   pH, Arterial 7.385 7.350 - 7.450   pCO2 arterial 41.1 35.0 - 45.0 mmHg   pO2, Arterial 75.0 (L) 80.0 - 100.0 mmHg   Bicarbonate 24.7 (H) 20.0 - 24.0 mEq/L   TCO2 26 0 -  100 mmol/L   O2 Saturation 95.0 %   Patient temperature 36.7 C    Collection site ARTERIAL LINE    Drawn by Nurse    Sample type ARTERIAL   Glucose, capillary     Status: None   Collection Time: 10/07/14 12:02 PM  Result Value Ref Range   Glucose-Capillary 93 65 - 99 mg/dL   Comment 1 Arterial Specimen   Glucose, capillary     Status: Abnormal    Collection Time: 10/07/14  6:08 PM  Result Value Ref Range   Glucose-Capillary 100 (H) 65 - 99 mg/dL  Glucose, capillary     Status: Abnormal   Collection Time: 10/07/14  7:33 PM  Result Value Ref Range   Glucose-Capillary 108 (H) 65 - 99 mg/dL   Comment 1 Notify RN    Comment 2 Document in Chart   I-STAT, chem 8     Status: Abnormal   Collection Time: 10/07/14  8:39 PM  Result Value Ref Range   Sodium 142 135 - 145 mmol/L   Potassium 3.7 3.5 - 5.1 mmol/L   Chloride 102 101 - 111 mmol/L   BUN 16 6 - 20 mg/dL   Creatinine, Ser 1.20 0.61 - 1.24 mg/dL   Glucose, Bld 125 (H) 65 - 99 mg/dL   Calcium, Ion 1.00 (L) 1.12 - 1.23 mmol/L   TCO2 22 0 - 100 mmol/L   Hemoglobin 10.5 (L) 13.0 - 17.0 g/dL   HCT 31.0 (L) 39.0 - 52.0 %  Glucose, capillary     Status: None   Collection Time: 10/07/14 11:43 PM  Result Value Ref Range   Glucose-Capillary 93 65 - 99 mg/dL   Comment 1 Notify RN    Comment 2 Document in Chart   Glucose, capillary     Status: Abnormal   Collection Time: 10/08/14  3:42 AM  Result Value Ref Range   Glucose-Capillary 111 (H) 65 - 99 mg/dL   Comment 1 Notify RN    Comment 2 Document in Chart   Comprehensive metabolic panel     Status: Abnormal   Collection Time: 10/08/14  4:45 AM  Result Value Ref Range   Sodium 139 135 - 145 mmol/L   Potassium 4.0 3.5 - 5.1 mmol/L   Chloride 104 101 - 111 mmol/L   CO2 27 22 - 32 mmol/L   Glucose, Bld 131 (H) 65 - 99 mg/dL   BUN 16 6 - 20 mg/dL   Creatinine, Ser 1.38 (H) 0.61 - 1.24 mg/dL   Calcium 7.2 (L) 8.9 - 10.3 mg/dL   Total Protein 5.5 (L) 6.5 - 8.1 g/dL   Albumin 3.0 (L) 3.5 - 5.0 g/dL   AST 138 (H) 15 - 41 U/L   ALT 69 (H) 17 - 63 U/L   Alkaline Phosphatase 44 38 - 126 U/L   Total Bilirubin 1.7 (H) 0.3 - 1.2 mg/dL   GFR calc non Af Amer 55 (L) >60 mL/min   GFR calc Af Amer >60 >60 mL/min    Comment: (NOTE) The eGFR has been calculated using the CKD EPI equation. This calculation has not been validated in all  clinical situations. eGFR's persistently <60 mL/min signify possible Chronic Kidney Disease.    Anion gap 8 5 - 15  I-STAT 3, arterial blood gas (G3+)     Status: Abnormal   Collection Time: 10/08/14  4:47 AM  Result Value Ref Range   pH, Arterial 7.399 7.350 - 7.450   pCO2 arterial 39.4 35.0 -  45.0 mmHg   pO2, Arterial 53.0 (L) 80.0 - 100.0 mmHg   Bicarbonate 24.2 (H) 20.0 - 24.0 mEq/L   TCO2 25 0 - 100 mmol/L   O2 Saturation 86.0 %   Patient temperature 37.5 C    Collection site ARTERIAL LINE    Drawn by Nurse    Sample type ARTERIAL   Glucose, capillary     Status: Abnormal   Collection Time: 10/08/14  7:40 AM  Result Value Ref Range   Glucose-Capillary 110 (H) 65 - 99 mg/dL   Comment 1 Capillary Specimen    Comment 2 Notify RN   Glucose, capillary     Status: Abnormal   Collection Time: 10/08/14 11:40 AM  Result Value Ref Range   Glucose-Capillary 113 (H) 65 - 99 mg/dL   Comment 1 Capillary Specimen    Comment 2 Notify RN   Glucose, capillary     Status: Abnormal   Collection Time: 10/08/14  4:50 PM  Result Value Ref Range   Glucose-Capillary 100 (H) 65 - 99 mg/dL   Comment 1 Capillary Specimen    Comment 2 Notify RN   Magnesium     Status: None   Collection Time: 10/08/14  5:08 PM  Result Value Ref Range   Magnesium 2.2 1.7 - 2.4 mg/dL  CBC     Status: Abnormal   Collection Time: 10/08/14  5:08 PM  Result Value Ref Range   WBC 15.7 (H) 4.0 - 10.5 K/uL   RBC 3.50 (L) 4.22 - 5.81 MIL/uL   Hemoglobin 9.9 (L) 13.0 - 17.0 g/dL   HCT 29.6 (L) 39.0 - 52.0 %   MCV 84.6 78.0 - 100.0 fL   MCH 28.3 26.0 - 34.0 pg   MCHC 33.4 30.0 - 36.0 g/dL   RDW 15.5 11.5 - 15.5 %   Platelets 132 (L) 150 - 400 K/uL  Creatinine, serum     Status: Abnormal   Collection Time: 10/08/14  5:08 PM  Result Value Ref Range   Creatinine, Ser 1.30 (H) 0.61 - 1.24 mg/dL   GFR calc non Af Amer 59 (L) >60 mL/min   GFR calc Af Amer >60 >60 mL/min    Comment: (NOTE) The eGFR has been calculated  using the CKD EPI equation. This calculation has not been validated in all clinical situations. eGFR's persistently <60 mL/min signify possible Chronic Kidney Disease.   I-STAT, chem 8     Status: Abnormal   Collection Time: 10/08/14  5:20 PM  Result Value Ref Range   Sodium 140 135 - 145 mmol/L   Potassium 3.9 3.5 - 5.1 mmol/L   Chloride 101 101 - 111 mmol/L   BUN 21 (H) 6 - 20 mg/dL   Creatinine, Ser 1.30 (H) 0.61 - 1.24 mg/dL   Glucose, Bld 107 (H) 65 - 99 mg/dL   Calcium, Ion 1.02 (L) 1.12 - 1.23 mmol/L   TCO2 22 0 - 100 mmol/L   Hemoglobin 11.2 (L) 13.0 - 17.0 g/dL   HCT 33.0 (L) 39.0 - 52.0 %  Glucose, capillary     Status: Abnormal   Collection Time: 10/08/14  7:17 PM  Result Value Ref Range   Glucose-Capillary 104 (H) 65 - 99 mg/dL   Comment 1 Capillary Specimen    Comment 2 Notify RN    Comment 3 Document in Chart   I-STAT 3, arterial blood gas (G3+)     Status: Abnormal   Collection Time: 10/08/14  8:24 PM  Result Value Ref  Range   pH, Arterial 7.399 7.350 - 7.450   pCO2 arterial 38.9 35.0 - 45.0 mmHg   pO2, Arterial 47.0 (L) 80.0 - 100.0 mmHg   Bicarbonate 24.1 (H) 20.0 - 24.0 mEq/L   TCO2 25 0 - 100 mmol/L   O2 Saturation 83.0 %   Acid-base deficit 1.0 0.0 - 2.0 mmol/L   Patient temperature 97.9 F    Collection site ARTERIAL LINE    Drawn by Nurse    Sample type ARTERIAL   I-STAT 3, arterial blood gas (G3+)     Status: Abnormal   Collection Time: 10/08/14 11:07 PM  Result Value Ref Range   pH, Arterial 7.394 7.350 - 7.450   pCO2 arterial 43.2 35.0 - 45.0 mmHg   pO2, Arterial 60.0 (L) 80.0 - 100.0 mmHg   Bicarbonate 26.5 (H) 20.0 - 24.0 mEq/L   TCO2 28 0 - 100 mmol/L   O2 Saturation 91.0 %   Acid-Base Excess 1.0 0.0 - 2.0 mmol/L   Patient temperature 98.0 F    Collection site ARTERIAL LINE    Drawn by Nurse    Sample type ARTERIAL   Glucose, capillary     Status: None   Collection Time: 10/08/14 11:26 PM  Result Value Ref Range   Glucose-Capillary 78  65 - 99 mg/dL   Comment 1 Capillary Specimen    Comment 2 Notify RN    Comment 3 Document in Chart   Glucose, capillary     Status: None   Collection Time: 10/09/14  3:56 AM  Result Value Ref Range   Glucose-Capillary 95 65 - 99 mg/dL   Comment 1 Capillary Specimen    Comment 2 Notify RN    Comment 3 Document in Chart   Basic metabolic panel     Status: Abnormal   Collection Time: 10/09/14  5:50 AM  Result Value Ref Range   Sodium 136 135 - 145 mmol/L   Potassium 3.9 3.5 - 5.1 mmol/L   Chloride 101 101 - 111 mmol/L   CO2 27 22 - 32 mmol/L   Glucose, Bld 110 (H) 65 - 99 mg/dL   BUN 22 (H) 6 - 20 mg/dL   Creatinine, Ser 1.24 0.61 - 1.24 mg/dL   Calcium 7.3 (L) 8.9 - 10.3 mg/dL   GFR calc non Af Amer >60 >60 mL/min   GFR calc Af Amer >60 >60 mL/min    Comment: (NOTE) The eGFR has been calculated using the CKD EPI equation. This calculation has not been validated in all clinical situations. eGFR's persistently <60 mL/min signify possible Chronic Kidney Disease.    Anion gap 8 5 - 15  CBC     Status: Abnormal   Collection Time: 10/09/14  5:50 AM  Result Value Ref Range   WBC 18.0 (H) 4.0 - 10.5 K/uL   RBC 3.58 (L) 4.22 - 5.81 MIL/uL   Hemoglobin 10.0 (L) 13.0 - 17.0 g/dL   HCT 30.4 (L) 39.0 - 52.0 %   MCV 84.9 78.0 - 100.0 fL   MCH 27.9 26.0 - 34.0 pg   MCHC 32.9 30.0 - 36.0 g/dL   RDW 15.3 11.5 - 15.5 %   Platelets 138 (L) 150 - 400 K/uL  Glucose, capillary     Status: None   Collection Time: 10/09/14  7:42 AM  Result Value Ref Range   Glucose-Capillary 87 65 - 99 mg/dL   Comment 1 Capillary Specimen    Comment 2 Notify RN     IMAGING:  Dg Chest Port 1 View  10/09/2014   CLINICAL DATA:  Increasing chest pain  EXAM: PORTABLE CHEST - 1 VIEW  COMPARISON:  10/08/2014  FINDINGS: Cardiac shadow is mildly enlarged but stable. Right-sided jugular sheath and catheter are again identified. The Swan-Ganz catheter has been removed. The mediastinal drain and pericardial drain the  been removed as well. Patchy infiltrative changes are noted in the left mid lung. Some mild interval clearing in the left base at is noted. Persistent changes in the right lung base are seen.  IMPRESSION: Increasing patchy infiltrate in the left mid lung.   Electronically Signed   By: Inez Catalina M.D.   On: 10/09/2014 07:47   Dg Chest Port 1 View  10/08/2014   CLINICAL DATA:  59 year old male status post cardiac surgery. Type a aortic dissection. Initial encounter.  EXAM: PORTABLE CHEST - 1 VIEW  COMPARISON:  10/07/2014 and earlier.  FINDINGS: Portable AP semi upright view at 0554 hours. Extubated. Enteric tube removed. Stable right IJ Swan-Ganz catheter and superimposed central line. Stable left chest tube and mediastinal drain.  Mildly lower lung volumes. Increased bibasilar opacity with retrocardiac air bronchograms, and veiling opacity. Stable cardiac size and mediastinal contours. No pneumothorax or pulmonary edema.  Increased gaseous distension of the stomach, mild to moderate.  IMPRESSION: 1. Extubated and enteric tube removed. 2. Lower lung volumes with bibasilar collapse or consolidation and small effusions. 3.  Otherwise, stable lines and tubes.   Electronically Signed   By: Genevie Ann M.D.   On: 10/08/2014 07:48    HOSPITAL DIAGNOSES: Principal Problem:   Aortic dissection Active Problems:   Hospital-acquired pneumonia   Ischemic leg   IMPRESSION: 1. Acute aortic dissection s/p aortic grafting 2. RLE ischemia s/p fem-fem bypass grafting  3. Acute respiratory failure - secondary to hospital acquired pneumonia  RECOMMENDATION: 1. Mr. Carrera had acute decompensation of his respiratory status requiring BiPAP and is currently on nonrebreather. Chest x-ray demonstrates a pneumonia. Pulmonary critical care is seen him for this and is managing it. I do not feel that he's in any heart failure or has signs of volume overload. Ejection fraction and wall motion was reportedly normal in his  interoperative TEE. It would be reasonable to consider a 2-D echocardiogram at some point prior to discharge once we can obtain images and his surgical bandages are removed. Telemetry does not demonstrate any acute arrhythmias. There is been no evidence of MI. Initial cardiac troponin was negative and BNP was 8.6.  Blood pressure is somewhat elevated and he will likely need additional blood pressure medication prior to discharge. Otherwise I have no further suggestions at this time.  Cardiology is available as needed. Thank you for the consultation.  Time Spent Directly with Patient: 45 minutes  Pixie Casino, MD, [facility] Attending Cardiologist [place] 10/09/2014, 11:06 AM

## 2014-10-09 NOTE — Progress Notes (Signed)
Patient ID: Victor [name], male   DOB: [birth year], 59 y.o.   MRN: [id number]  TCTS DAILY ICU PROGRESS NOTE                   [address]            [place] [id number]          [phone]   2 Days Post-Op Procedure(s) (LRB): LEFT  FEMORAL ARTERY -RIGHT FEMORAL ARTERY BYPASS GRAFT USING X 30 CM HEMASHIELD GOLD GRAFT (N/A) Past Surgical History  Procedure Laterality Date  . Thoracic aortic aneurysm repair N/A 10/06/2014    Procedure: THORACIC ASCENDING ANEURYSM REPAIR (AAA);  Surgeon: [name], MD;  Location: [facility] OR;  Service: Open Heart Surgery;  Laterality: N/A;  hyportermia circulatory arrest and resuspension of aortic valve  . Femoral-femoral bypass graft N/A 10/07/2014    Procedure: LEFT  FEMORAL ARTERY -RIGHT FEMORAL ARTERY BYPASS GRAFT USING X 30 CM HEMASHIELD GOLD GRAFT;  Surgeon: Larina Earthly, MD;  Location: [facility] OR;  Service: Vascular;  Laterality: N/A;   Total Length of Stay:  LOS: 2 days   Subjective: On bip for hypoxia last pm  Objective: Vital signs in last 24 hours: Temp:  [97.8 F (36.6 C)-99.7 F (37.6 C)] 98 F (36.7 C) (06/15 0745) Pulse Rate:  [86-102] 86 (06/15 0700) Cardiac Rhythm:  [-] Normal sinus rhythm (06/15 0400) Resp:  [9-25] 18 (06/15 0700) BP: (93-138)/(48-69) 101/60 mmHg (06/15 0700) SpO2:  [90 %-100 %] 97 % (06/15 0700) Arterial Line BP: (96-150)/(43-72) 102/47 mmHg (06/15 0700) FiO2 (%):  [100 %] 100 % (06/15 0459) Weight:  [178 lb 9.2 oz (81 kg)] 178 lb 9.2 oz (81 kg) (06/15 0500)  Filed Weights   10/07/14 0500 10/08/14 0500 10/09/14 0500  Weight: 178 lb (80.74 kg) 181 lb 7 oz (82.3 kg) 178 lb 9.2 oz (81 kg)    Weight change: -2 lb 13.9 oz (-1.3 kg)   Hemodynamic parameters for last 24 hours: PAP: (23-25)/(15-16) 25/16 mmHg CO:  [4.9 L/min] 4.9 L/min CI:  [2.6 L/min/m2] 2.6 L/min/m2  Intake/Output from previous day: 06/14 0701 - 06/15 0700 In: 1278.2 [P.O.:360; I.V.:818.2; IV Piggyback:100] Out: 1995  [Urine:1975; Chest Tube:20]  Intake/Output this shift:    Current Meds: Scheduled Meds: . acetaminophen  1,000 mg Oral 4 times per day   Or  . acetaminophen (TYLENOL) oral liquid 160 mg/5 mL  1,000 mg Per Tube 4 times per day  . antiseptic oral rinse  7 mL Mouth Rinse BID  . aspirin EC  325 mg Oral Daily   Or  . aspirin  324 mg Per Tube Daily  . bisacodyl  10 mg Oral Daily   Or  . bisacodyl  10 mg Rectal Daily  . docusate sodium  200 mg Oral Daily  . enoxaparin (LOVENOX) injection  30 mg Subcutaneous Q24H  . insulin aspart  0-24 Units Subcutaneous 6 times per day  . metoprolol tartrate  12.5 mg Oral BID   Or  . metoprolol tartrate  12.5 mg Per Tube BID  . pantoprazole  40 mg Oral Daily  . sodium chloride  3 mL Intravenous Q12H   Continuous Infusions: . sodium chloride Stopped (10/08/14 0900)  . sodium chloride    . sodium chloride 20 mL/hr at 10/07/14 0200  . dexmedetomidine Stopped (10/07/14 1000)  . DOPamine 3 mcg/kg/min (10/08/14 2000)  . lactated ringers Stopped (10/07/14 0215)  . lactated ringers 20 mL/hr  at 10/07/14 0215  . nitroGLYCERIN Stopped (10/07/14 0800)  . nitroPRUSSide    . phenylephrine (NEO-SYNEPHRINE) Adult infusion Stopped (10/08/14 1700)   PRN Meds:.sodium chloride, metoprolol, morphine injection, ondansetron (ZOFRAN) IV, oxyCODONE, sodium chloride, traMADol  General appearance: alert, cooperative and no distress Neurologic: intact Heart: regular rate and rhythm, S1, S2 normal, no murmur, click, rub or gallop Lungs: diminished breath sounds bilaterally Abdomen: soft, non-tender; bowel sounds normal; no masses,  no organomegaly Extremities: extremities normal, atraumatic, no cyanosis or edema and Homans sign is negative, no sign of DVT Wound: sternum stable  Lab Results: CBC: Recent Labs  10/08/14 1708 10/08/14 1720 10/09/14 0550  WBC 15.7*  --  18.0*  HGB 9.9* 11.2* 10.0*  HCT 29.6* 33.0* 30.4*  PLT 132*  --  138*   BMET:  Recent Labs   10/08/14 0445  10/08/14 1720 10/09/14 0550  NA 139  --  140 136  K 4.0  --  3.9 3.9  CL 104  --  101 101  CO2 27  --   --  27  GLUCOSE 131*  --  107* 110*  BUN 16  --  21* 22*  CREATININE 1.38*  < > 1.30* 1.24  CALCIUM 7.2*  --   --  7.3*  < > = values in this interval not displayed.  PT/INR:  Recent Labs  10/07/14 0215  LABPROT 19.0*  INR 1.59*   Radiology: No results found.   Assessment/Plan: S/P Procedure(s) (LRB): Past Surgical History  Procedure Laterality Date  . Thoracic aortic aneurysm repair N/A 10/06/2014    Procedure: THORACIC ASCENDING ANEURYSM REPAIR (AAA);  Surgeon: [name], MD;  Location: [facility] OR;  Service: Open Heart Surgery;  Laterality: N/A;  hyportermia circulatory arrest and resuspension of aortic valve  . Femoral-femoral bypass graft N/A 10/07/2014    Procedure: LEFT  FEMORAL ARTERY -RIGHT FEMORAL ARTERY BYPASS GRAFT USING X 30 CM HEMASHIELD GOLD GRAFT;  Surgeon: Larina Earthly, MD;  Location: [facility] OR;  Service: Vascular;  Laterality: N/A;   Mobilize Diuresis Continue foley due to strict I&O, patient in ICU and urinary output monitoring Renal function stable Wean off dopamine bipap for low po2 Lasix this am Sputum culture  [name] 10/09/2014 7:47 AM

## 2014-10-09 NOTE — Consult Note (Signed)
PULMONARY / CRITICAL CARE MEDICINE   Name: Victor [name] MRN: [id number] DOB: [birth year]    ADMISSION DATE:  10/06/2014 CONSULTATION DATE:  10/09/14  REFERRING MD :  Tyrone Sage   CHIEF COMPLAINT:  Respiratory failure   INITIAL PRESENTATION: 59yo male smoker with hx polysubstance abuse otherwise no known PMH, presented 6/12 with chest pain.  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  He underwent emergent repair 6/12.  Post op had R leg pain and diminished pulses and ultimately required return to OR 6/13 for fem-fem bypass r/t R leg ischemia. Extubated after both OR trips but on 6/15 developed worsening hypoxia, increased WOB.  Pt was placed on bipap and PCCM consulted.  STUDIES:  CTA chest/abd/pelvis 6/12>>> Type A aortic dissection with extension into the distal R common iliac artery. the right external iliac artery appears completely occluded proximally.  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  Massive left inguinal hernia containing multiple loops of small bowel. No associated incarceration or obstruction at this time.  SIGNIFICANT EVENTS:    HISTORY OF PRESENT ILLNESS:  59yo male with hx polysubstance abuse otherwise no known PMH, presented 6/12 with chest pain.  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  He underwent emergent repair 6/12.  Post op had R leg pain and diminished pulses and ultimately required return to OR 6/13 for fem-fem bypass r/t R leg ischemia. Extubated after both OR trips but on 6/15 developed worsening hypoxia, increased WOB.  Pt was placed on bipap and PCCM consulted.  PAST MEDICAL HISTORY :   has no past medical history on file.  has past surgical history that includes Thoracic aortic aneurysm repair (N/A, 10/06/2014) and Femoral-femoral Bypass Graft (N/A, 10/07/2014). Prior to Admission medications   Medication Sig Start Date  End Date Taking? Authorizing Provider  acetaminophen (TYLENOL) 500 MG tablet Take 500 mg by mouth every 6 (six) hours as needed (pain).   Yes Historical Provider, MD   No Known Allergies  FAMILY HISTORY:  has no family status information on file.  SOCIAL HISTORY:  reports that he has been smoking Cigarettes.  He does not have any smokeless tobacco history on file. He reports that he does not drink alcohol or use illicit drugs.  REVIEW OF SYSTEMS:  As per HPI - All other systems reviewed and were neg.   SUBJECTIVE:   VITAL SIGNS: Temp:  [97.8 F (36.6 C)-98.7 F (37.1 C)] 98 F (36.7 C) (06/15 0745) Pulse Rate:  [86-102] 93 (06/15 0900) Resp:  [9-23] 14 (06/15 0900) BP: (93-138)/(48-69) 124/56 mmHg (06/15 0820) SpO2:  [90 %-100 %] 93 % (06/15 0900) Arterial Line BP: (96-150)/(43-72) 118/45 mmHg (06/15 0900) FiO2 (%):  [60 %-100 %] 60 % (06/15 0849) Weight:  [178 lb 9.2 oz (81 kg)] 178 lb 9.2 oz (81 kg) (06/15 0500) HEMODYNAMICS:   VENTILATOR SETTINGS: Vent Mode:  [-] BIPAP;PCV FiO2 (%):  [60 %-100 %] 60 % Set Rate:  [15 bmp] 15 bmp PEEP:  [8 cmH20] 8 cmH20 Pressure Support:  [9 cmH20] 9 cmH20 INTAKE / OUTPUT:  Intake/Output Summary (Last 24 hours) at 10/09/14 1008 Last data filed at 10/09/14 0900  Gross per 24 hour  Intake 1096.4 ml  Output   1525 ml  Net -428.6 ml    PHYSICAL EXAMINATION: General:  Pleasant, wdwn male, NAD in chair  Neuro:  Awake, alert, appropriate, MAE,  gen weakness  HEENT:  Mm dry, NRB Cardiovascular:  s1s2 rrr, NSR 110, sternotomy incision c/d Lungs:  resps even, mildly tachypneic, few scattered rhonchi, diminished L  Abdomen:  Soft, round, mildly tender diffusely  Musculoskeletal:  Warm and dry, no edema   LABS:  CBC  Recent Labs Lab 10/07/14 0600  10/08/14 1708 10/08/14 1720 10/09/14 0550  WBC 9.7  --  15.7*  --  18.0*  HGB 10.5*  < > 9.9* 11.2* 10.0*  HCT 31.6*  < > 29.6* 33.0* 30.4*  PLT 153  --  132*  --  138*  < > = values  in this interval not displayed. Coag's  Recent Labs Lab 10/06/14 1830 10/06/14 2355 10/07/14 0215  APTT 31 >200* 35  INR 1.29 2.76* 1.59*   BMET  Recent Labs Lab 10/07/14 0600  10/08/14 0445 10/08/14 1708 10/08/14 1720 10/09/14 0550  NA 141  < > 139  --  140 136  K 3.9  < > 4.0  --  3.9 3.9  CL 108  < > 104  --  101 101  CO2 26  --  27  --   --  27  BUN 12  < > 16  --  21* 22*  CREATININE 1.17  < > 1.38* 1.30* 1.30* 1.24  GLUCOSE 111*  < > 131*  --  107* 110*  < > = values in this interval not displayed. Electrolytes  Recent Labs Lab 10/07/14 0215 10/07/14 0600 10/08/14 0445 10/08/14 1708 10/09/14 0550  CALCIUM 7.1* 7.3* 7.2*  --  7.3*  MG 1.9 2.8*  --  2.2  --    Sepsis Markers No results for input(s): LATICACIDVEN, PROCALCITON, O2SATVEN in the last 168 hours. ABG  Recent Labs Lab 10/08/14 0447 10/08/14 2024 10/08/14 2307  PHART 7.399 7.399 7.394  PCO2ART 39.4 38.9 43.2  PO2ART 53.0* 47.0* 60.0*   Liver Enzymes  Recent Labs Lab 10/06/14 1659 10/08/14 0445  AST 180* 138*  ALT 157* 69*  ALKPHOS 61 44  BILITOT 1.4* 1.7*  ALBUMIN 3.9 3.0*   Cardiac Enzymes No results for input(s): TROPONINI, PROBNP in the last 168 hours. Glucose  Recent Labs Lab 10/08/14 0740 10/08/14 1140 10/08/14 1650 10/08/14 1917 10/08/14 2326 10/09/14 0356  GLUCAP 110* 113* 100* 104* 78 95    Imaging Dg Chest Port 1 View  10/09/2014   CLINICAL DATA:  Increasing chest pain  EXAM: PORTABLE CHEST - 1 VIEW  COMPARISON:  10/08/2014  FINDINGS: Cardiac shadow is mildly enlarged but stable. Right-sided jugular sheath and catheter are again identified. The Swan-Ganz catheter has been removed. The mediastinal drain and pericardial drain the been removed as well. Patchy infiltrative changes are noted in the left mid lung. Some mild interval clearing in the left base at is noted. Persistent changes in the right lung base are seen.  IMPRESSION: Increasing patchy infiltrate in the  left mid lung.   Electronically Signed   By: Alcide Clever M.D.   On: 10/09/2014 07:47     ASSESSMENT / PLAN:  PULMONARY Acute hypoxic respiratory failure  HCAP Tobacco abuse   P:   Continue bipap for now  Wean O2 as able  F/u CXR  No further ABG  BD's  Empiric abx for HCAP as below  Aggressive pulmonary hygiene as able - will need adequate pain control  Smoking cessation   CARDIOVASCULAR R IJ CVL 6/12>>> Aortic dissection - type A.  S/p repair 6/12 Occluded R iliac artery - s/p  fem-fem bypass 6/13 HTN - mild  P:  VVS, CVTS following  Continue dobutamine - weaning to off  Lasix x 1 given 6/15 per CVTS - hold further  PRN metoprolol  ASA   RENAL No active issue  P:   F/u chem  Diuresis x1 as above - hold further    GASTROINTESTINAL Massive L inguinal hernia  P:   PPI  Clear liquids  Consider surgical input for inguinal hernia but would need to be elective once recovered from this   HEMATOLOGIC Leukocytosis  Anemia - mild  P:  SCD's  F/u cbc  Transfuse for hgb <7  INFECTIOUS HCAP  P:   Sputum 6/15  Vanc 6/15>>> Zosyn 6/15>>>  ENDOCRINE No active issue  P:   F/u glucose on chem   NEUROLOGIC No active issue  P:   Post op pain control with PRN rx     FAMILY  - Updates:  Pt updated at bedside 6/15  - Inter-disciplinary family meet or Palliative Care meeting due by:  6/22    Dirk Dress, NP 10/09/2014  10:08 AM Pager: [phone] or [phone]  59 year old male with extensive PMH, post op vascular repair x2, hypoxemic respiratory failure, on BiPAP, diffuse crackles on exam.  Maintain on BiPAP as tolerated, pan cultures, vanc/zosyn ordered, f/u on culture, PCT, IS per RT protocol, flutter valve, ambulation as able and OOB to chair.  Hold in the ICU.  The patient is critically ill with multiple organ systems failure and requires high complexity decision making for assessment and support, frequent evaluation and titration of  therapies, application of advanced monitoring technologies and extensive interpretation of multiple databases.   Critical Care Time devoted to patient care services described in this note is  35  Minutes. This time reflects time of care of this signee Dr Koren Bound. This critical care time does not reflect procedure time, or teaching time or supervisory time of PA/NP/Med student/Med Resident etc but could involve care discussion time.  Alyson Reedy, M.D. [facility] Pulmonary/Critical Care Medicine. Pager: [phone]. After hours pager: [phone].

## 2014-10-09 NOTE — Care Management Note (Signed)
Case Management Note  Patient Details  Name: CYNTHIA CHALLENDER MRN: [id number] Date of Birth: [birth year]  Subjective/Objective:         Patient on 100% NRB mask - sitting up in chair.  States plans for discharge are for him to discharge home to his parents.            Action/Plan:   Expected Discharge Date:                  Expected Discharge Plan:  Home w Home Health Services  In-House Referral:     Discharge planning Services     Post Acute Care Choice:    Choice offered to:     DME Arranged:    DME Agency:     HH Arranged:    HH Agency:     Status of Service:  In process, will continue to follow  Medicare Important Message Given:    Date Medicare IM Given:    Medicare IM give by:    Date Additional Medicare IM Given:    Additional Medicare Important Message give by:     If discussed at Long Length of Stay Meetings, dates discussed:    Additional Comments:  Vangie Bicker, RN 10/09/2014, 1:42 PM

## 2014-10-09 NOTE — Progress Notes (Signed)
Took Pt off non rebreather and placed on 6L [place]. Sats dropped to 85 so I placed Pt on venturi at 55%. Sats range from 89%-95% because of bad waveform. Patient shows no sign of respiratory distress.

## 2014-10-09 NOTE — Progress Notes (Signed)
ANTIBIOTIC CONSULT NOTE - INITIAL  Pharmacy Consult for Vancomycin and Zosyn Indication: HCAP  No Known Allergies  Patient Measurements: Height: 5\' 10"  (177.8 cm) Weight: 178 lb 9.2 oz (81 kg) IBW/kg (Calculated) : 73  Vital Signs: Temp: 98 F (36.7 C) (06/15 0745) Temp Source: Oral (06/15 0745) BP: 124/56 mmHg (06/15 0820) Pulse Rate: 93 (06/15 0900) Intake/Output from previous day: 06/14 0701 - 06/15 0700 In: 1278.2 [P.O.:360; I.V.:818.2; IV Piggyback:100] Out: 1995 [Urine:1975; Chest Tube:20] Intake/Output from this shift: Total I/O In: 48.6 [I.V.:48.6] Out: 100 [Urine:100]  Labs:  Recent Labs  10/07/14 0600  10/08/14 1708 10/08/14 1720 10/09/14 0550  WBC 9.7  --  15.7*  --  18.0*  HGB 10.5*  < > 9.9* 11.2* 10.0*  PLT 153  --  132*  --  138*  CREATININE 1.17  < > 1.30* 1.30* 1.24  < > = values in this interval not displayed. Estimated Creatinine Clearance: 67 mL/min (by C-G formula based on Cr of 1.24). No results for input(s): VANCOTROUGH, VANCOPEAK, VANCORANDOM, GENTTROUGH, GENTPEAK, GENTRANDOM, TOBRATROUGH, TOBRAPEAK, TOBRARND, AMIKACINPEAK, AMIKACINTROU, AMIKACIN in the last 72 hours.   Microbiology: Recent Results (from the past 720 hour(s))  MRSA PCR Screening     Status: None   Collection Time: 10/07/14  4:33 AM  Result Value Ref Range Status   MRSA by PCR NEGATIVE NEGATIVE Final    Comment:        The GeneXpert MRSA Assay (FDA approved for NASAL specimens only), is one component of a comprehensive MRSA colonization surveillance program. It is not intended to diagnose MRSA infection nor to guide or monitor treatment for MRSA infections.     Medical History: History reviewed. No pertinent past medical history.  Medications:  Scheduled:  . acetaminophen  1,000 mg Oral 4 times per day   Or  . acetaminophen (TYLENOL) oral liquid 160 mg/5 mL  1,000 mg Per Tube 4 times per day  . antiseptic oral rinse  7 mL Mouth Rinse BID  . aspirin EC  325  mg Oral Daily   Or  . aspirin  324 mg Per Tube Daily  . bisacodyl  10 mg Oral Daily   Or  . bisacodyl  10 mg Rectal Daily  . chlorhexidine  15 mL Mouth Rinse BID  . docusate sodium  200 mg Oral Daily  . enoxaparin (LOVENOX) injection  30 mg Subcutaneous Q24H  . insulin aspart  0-24 Units Subcutaneous 6 times per day  . metoprolol tartrate  12.5 mg Oral BID   Or  . metoprolol tartrate  12.5 mg Per Tube BID  . pantoprazole  40 mg Oral Daily  . piperacillin-tazobactam (ZOSYN)  IV  3.375 g Intravenous 3 times per day  . sodium chloride  3 mL Intravenous Q12H  . vancomycin  1,500 mg Intravenous Once  . vancomycin  1,000 mg Intravenous Q12H   Infusions:  . sodium chloride    . sodium chloride 20 mL/hr at 10/07/14 0200  . DOPamine 3 mcg/kg/min (10/08/14 2000)  . lactated ringers 20 mL/hr at 10/07/14 0215   Assessment: 59 yo M admitted on 10/06/2014 with sudden onset midsternal CP radiating through back. Subsequent CTa c/w type A aortic dissection now s/p repair on 6/13. Pharmacy has now been consulted to dose vancomycin/zosyn for suspected HCAP with worsening hypoxia and increasing patchy infiltrate in left med lung on CXR. Tmax 99.7, WBC trend up to 18, SCr down to 1.24 (CrCl ~67 ml/min).  Vanc 6/15>>  Zosyn 6/15>>  6/15 BCx2>>  6/15 sputum cx>>   Goal of Therapy:  Vancomycin trough level 15-20 mcg/ml  Plan:  - Vancomycin 1500 mg IV x 1, followed by 1000 mg IV q12h  - Zosyn 3.375 gm IV q8h (4 hr infusion)  - Monitor renal function, temp, WBC, pct, C&S, VT as indicated  Helina Hullum K. Bonnye Fava, PharmD, BCPS Clinical Pharmacist - Resident Pager: [phone] Pharmacy: [phone] 10/09/2014 10:30 AM

## 2014-10-10 ENCOUNTER — Inpatient Hospital Stay (HOSPITAL_COMMUNITY): Payer: Self-pay

## 2014-10-10 ENCOUNTER — Encounter (HOSPITAL_COMMUNITY): Payer: Self-pay | Admitting: General Practice

## 2014-10-10 DIAGNOSIS — I7100 Dissection of unspecified site of aorta: Secondary | ICD-10-CM

## 2014-10-10 LAB — CBC
HCT: 31.6 % — ABNORMAL LOW (ref 39.0–52.0)
Hemoglobin: 10.5 g/dL — ABNORMAL LOW (ref 13.0–17.0)
MCH: 27.8 pg (ref 26.0–34.0)
MCHC: 33.2 g/dL (ref 30.0–36.0)
MCV: 83.6 fL (ref 78.0–100.0)
Platelets: 167 10*3/uL (ref 150–400)
RBC: 3.78 MIL/uL — ABNORMAL LOW (ref 4.22–5.81)
RDW: 14.8 % (ref 11.5–15.5)
WBC: 14.7 10*3/uL — ABNORMAL HIGH (ref 4.0–10.5)

## 2014-10-10 LAB — GLUCOSE, CAPILLARY
Glucose-Capillary: 98 mg/dL (ref 65–99)
Glucose-Capillary: 103 mg/dL — ABNORMAL HIGH (ref 65–99)
Glucose-Capillary: 150 mg/dL — ABNORMAL HIGH (ref 65–99)
Glucose-Capillary: 113 mg/dL — ABNORMAL HIGH (ref 65–99)
Glucose-Capillary: 83 mg/dL (ref 65–99)
Glucose-Capillary: 99 mg/dL (ref 65–99)

## 2014-10-10 LAB — BASIC METABOLIC PANEL
Anion gap: 9 (ref 5–15)
BUN: 22 mg/dL — ABNORMAL HIGH (ref 6–20)
CO2: 28 mmol/L (ref 22–32)
Calcium: 7.7 mg/dL — ABNORMAL LOW (ref 8.9–10.3)
Chloride: 100 mmol/L — ABNORMAL LOW (ref 101–111)
Creatinine, Ser: 1.28 mg/dL — ABNORMAL HIGH (ref 0.61–1.24)
GFR calc Af Amer: >60 mL/min (ref >60)
GFR calc non Af Amer: >60 mL/min (ref >60)
Glucose, Bld: 102 mg/dL — ABNORMAL HIGH (ref 65–99)
Potassium: 3.3 mmol/L — ABNORMAL LOW (ref 3.5–5.1)
Sodium: 137 mmol/L (ref 135–145)

## 2014-10-10 LAB — PROCALCITONIN: Procalcitonin: 0.92 ng/mL

## 2014-10-10 MED ORDER — GUAIFENESIN ER 600 MG PO TB12
600.0000 mg | ORAL_TABLET | Freq: Two times a day (BID) | ORAL | Status: DC
  Administered 2014-10-10 – 2014-10-15 (×11): 600 mg via ORAL
  Filled 2014-10-10 (×14): qty 1

## 2014-10-10 MED ORDER — INSULIN ASPART 100 UNIT/ML ~~LOC~~ SOLN
0.0000 [IU] | Freq: Three times a day (TID) | SUBCUTANEOUS | Status: DC
  Administered 2014-10-11: 2 [IU] via SUBCUTANEOUS

## 2014-10-10 MED ORDER — POTASSIUM CHLORIDE 10 MEQ/50ML IV SOLN
10.0000 meq | INTRAVENOUS | Status: AC
  Administered 2014-10-10 (×3): 10 meq via INTRAVENOUS
  Filled 2014-10-10 (×3): qty 50

## 2014-10-10 MED ORDER — POTASSIUM CHLORIDE 10 MEQ/50ML IV SOLN
10.0000 meq | INTRAVENOUS | Status: AC
  Administered 2014-10-10 (×2): 10 meq via INTRAVENOUS
  Filled 2014-10-10: qty 50

## 2014-10-10 NOTE — Progress Notes (Addendum)
[facility] DAILY ICU PROGRESS NOTE                   [address]            [place] [address]          [phone]   3 Days Post-Op Procedure(s) (LRB): LEFT  FEMORAL ARTERY -RIGHT FEMORAL ARTERY BYPASS GRAFT USING X 30 CM HEMASHIELD GOLD GRAFT (N/A)  Total Length of Stay:  LOS: 3 days   Subjective: Comfortable, no complaints.   Objective: Vital signs in last 24 hours: Temp:  [98.1 F (36.7 C)-99.5 F (37.5 C)] 98.1 F (36.7 C) (06/16 0333) Pulse Rate:  [90-110] 108 (06/16 0600) Cardiac Rhythm:  [-] Sinus tachycardia (06/16 0730) Resp:  [5-34] 34 (06/16 0600) BP: (99-126)/(52-72) 107/64 mmHg (06/16 0600) SpO2:  [89 %-98 %] 94 % (06/16 0600) Arterial Line BP: (118-153)/(45-63) 126/50 mmHg (06/15 1500) FiO2 (%):  [55 %-80 %] 55 % (06/16 0400) Weight:  [178 lb 12.7 oz (81.1 kg)] 178 lb 12.7 oz (81.1 kg) (06/16 0500)  Filed Weights   10/08/14 0500 10/09/14 0500 10/10/14 0500  Weight: 181 lb 7 oz (82.3 kg) 178 lb 9.2 oz (81 kg) 178 lb 12.7 oz (81.1 kg)  PRE-OPERATIVE WEIGHT: 77.1 kg  Weight change: 3.5 oz (0.1 kg)   Hemodynamic parameters for last 24 hours:    Intake/Output from previous day: 06/15 0701 - 06/16 0700 In: 3021.1 [P.O.:1740; I.V.:368.6; IV Piggyback:912.5] Out: 2825 [Urine:2825]  CBGs 99-89-98-103-102  Current Meds: Scheduled Meds: . acetaminophen  1,000 mg Oral 4 times per day   Or  . acetaminophen (TYLENOL) oral liquid 160 mg/5 mL  1,000 mg Per Tube 4 times per day  . antiseptic oral rinse  7 mL Mouth Rinse BID  . aspirin EC  325 mg Oral Daily   Or  . aspirin  324 mg Per Tube Daily  . bisacodyl  10 mg Oral Daily   Or  . bisacodyl  10 mg Rectal Daily  . chlorhexidine  15 mL Mouth Rinse BID  . docusate sodium  200 mg Oral Daily  . enoxaparin (LOVENOX) injection  30 mg Subcutaneous Q24H  . insulin aspart  0-24 Units Subcutaneous 6 times per day  . metoprolol tartrate  12.5 mg Oral BID   Or  . metoprolol tartrate  12.5 mg Per  Tube BID  . pantoprazole  40 mg Oral Daily  . piperacillin-tazobactam (ZOSYN)  IV  3.375 g Intravenous 3 times per day  . potassium chloride  10 mEq Intravenous Q1 Hr x 3  . sodium chloride  3 mL Intravenous Q12H  . vancomycin  1,000 mg Intravenous Q12H   Continuous Infusions: . sodium chloride    . sodium chloride 20 mL/hr at 10/07/14 0200  . DOPamine Stopped (10/09/14 1100)  . lactated ringers 20 mL/hr at 10/10/14 0400   PRN Meds:.levalbuterol, metoprolol, morphine injection, ondansetron (ZOFRAN) IV, oxyCODONE, sodium chloride, traMADol   Physical Exam: General appearance: alert, cooperative and no distress Heart: regular rate and rhythm Lungs: diminished breath sounds bibasilar Abdomen: soft, non-tender; bowel sounds normal; no masses,  no organomegaly Extremities: Warm, pedal pulses palpable Wound: Dressed and dry    Lab Results: CBC: Recent Labs  10/09/14 0550 10/10/14 0400  WBC 18.0* 14.7*  HGB 10.0* 10.5*  HCT 30.4* 31.6*  PLT 138* 167   BMET:  Recent Labs  10/09/14 0550 10/10/14 0400  NA 136 137  K 3.9 3.3*  CL 101  100*  CO2 27 28  GLUCOSE 110* 102*  BUN 22* 22*  CREATININE 1.24 1.28*  CALCIUM 7.3* 7.7*    PT/INR: No results for input(s): LABPROT, INR in the last 72 hours. Radiology: No results found.   Assessment/Plan: S/P Procedure(s) (LRB): LEFT  FEMORAL ARTERY -RIGHT FEMORAL ARTERY BYPASS GRAFT USING X 30 CM HEMASHIELD GOLD GRAFT (N/A)  CV- SR/ST, BPs stable. Continue low dose beta blocker. Off all drips.  PV- s/p Fem/fem bypass. Palpable pulses, VVS following.  Pulm- Continue aggressive pulm toilet, other care per pulm/CCM.  Hypokalemia- replace K+.  Renal- Cr up slightly this am. Good diuresis yesterday.  May need to continue Lasix.  Mobilize as tolerated.  COLLINS,GINA H 10/10/2014 8:01 AM   Patient seen and examined, agree with above Making some slow progress with respiratory situation Hypokalemia- replete K Will hold  lasix this AM, may need a dose later today  Viviann Spare C. Dorris Fetch, MD [facility] [phone]

## 2014-10-10 NOTE — Progress Notes (Addendum)
[facility]  Subjective  - states feet feel good numbness resolved   Objective 107/64 108 98.1 F (36.7 C) (Oral) 34 94%  Intake/Output Summary (Last 24 hours) at 10/10/14 0736 Last data filed at 10/10/14 0600  Gross per 24 hour  Intake 2981.1 ml  Output   2825 ml  Net  156.1 ml    Palpable DP 2+ bilateral Groins soft, incisions healing well   Assessment/Planning: POD # 3 Right leg ischemia secondary to aortic dissection iliac artery occlusion Extubated alert and oriented  Mobility when lung function permits   Clinton Gallant [facility] 10/10/2014 7:36 AM --  Laboratory Lab Results:  Recent Labs  10/09/14 0550 10/10/14 0400  WBC 18.0* 14.7*  HGB 10.0* 10.5*  HCT 30.4* 31.6*  PLT 138* 167   BMET  Recent Labs  10/09/14 0550 10/10/14 0400  NA 136 137  K 3.9 3.3*  CL 101 100*  CO2 27 28  GLUCOSE 110* 102*  BUN 22* 22*  CREATININE 1.24 1.28*  CALCIUM 7.3* 7.7*    COAG Lab Results  Component Value Date   INR 1.59* 10/07/2014   INR 2.76* 10/06/2014   INR 1.29 10/06/2014   No results found for: PTT    I have examined the patient, reviewed and agree with above.2+ dp Peri Maris, MD 10/10/2014 7:55 AM

## 2014-10-10 NOTE — Consult Note (Signed)
PULMONARY / CRITICAL CARE MEDICINE   Name: Victor [name] MRN: [id number] DOB: 15-Jan-1956    ADMISSION DATE:  10/06/2014 CONSULTATION DATE:  10/09/14  REFERRING MD :  Tyrone Sage   CHIEF COMPLAINT:  Respiratory failure   INITIAL PRESENTATION: 59yo male smoker with hx polysubstance abuse otherwise no known PMH, presented 6/12 with chest pain.  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  He underwent emergent repair 6/12.  Post op had R leg pain and diminished pulses and ultimately required return to OR 6/13 for fem-fem bypass r/t R leg ischemia. Extubated after both OR trips but on 6/15 developed worsening hypoxia, increased WOB.  Pt was placed on bipap and PCCM consulted.  STUDIES:  CTA chest/abd/pelvis 6/12>>> Type A aortic dissection with extension into the distal R common iliac artery. the right external iliac artery appears completely occluded proximally.  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  Massive left inguinal hernia containing multiple loops of small bowel. No associated incarceration or obstruction at this time.  SIGNIFICANT EVENTS: 6/12 thoracic aortic aneurysm repair, graft 6/13 left R fem-fem bypass  SUBJECTIVE: Feeling better, eating, off BIPAP  VITAL SIGNS: Temp:  [98.1 F (36.7 C)-99.9 F (37.7 C)] 99.9 F (37.7 C) (06/16 0700) Pulse Rate:  [93-110] 108 (06/16 0600) Resp:  [5-34] 34 (06/16 0600) BP: (99-126)/(52-72) 107/64 mmHg (06/16 0600) SpO2:  [89 %-98 %] 94 % (06/16 0600) Arterial Line BP: (118-153)/(45-63) 126/50 mmHg (06/15 1500) FiO2 (%):  [55 %] 55 % (06/16 0400) Weight:  [81.1 kg (178 lb 12.7 oz)] 81.1 kg (178 lb 12.7 oz) (06/16 0500) HEMODYNAMICS:   VENTILATOR SETTINGS: Vent Mode:  [-]  FiO2 (%):  [55 %] 55 % INTAKE / OUTPUT:  Intake/Output Summary (Last 24 hours) at 10/10/14 0855 Last data filed at 10/10/14 0700  Gross per 24 hour  Intake 2996.8  ml  Output   2725 ml  Net  271.8 ml    PHYSICAL EXAMINATION:  Gen: sitting up in bed, normal respiratory effort HENT: OP clear, NCAT PULM: Crackles bilaterally CV: RRR, no mgr, no edema, midline scar well dressed GI: BS+, soft, nontender Derm: no cyanosis or rash Psyche: normal mood and affect   LABS:  CBC  Recent Labs Lab 10/08/14 1708 10/08/14 1720 10/09/14 0550 10/10/14 0400  WBC 15.7*  --  18.0* 14.7*  HGB 9.9* 11.2* 10.0* 10.5*  HCT 29.6* 33.0* 30.4* 31.6*  PLT 132*  --  138* 167   Coag's  Recent Labs Lab 10/06/14 1830 10/06/14 2355 10/07/14 0215  APTT 31 >200* 35  INR 1.29 2.76* 1.59*   BMET  Recent Labs Lab 10/08/14 0445  10/08/14 1720 10/09/14 0550 10/10/14 0400  NA 139  --  140 136 137  K 4.0  --  3.9 3.9 3.3*  CL 104  --  101 101 100*  CO2 27  --   --  27 28  BUN 16  --  21* 22* 22*  CREATININE 1.38*  < > 1.30* 1.24 1.28*  GLUCOSE 131*  --  107* 110* 102*  < > = values in this interval not displayed. Electrolytes  Recent Labs Lab 10/07/14 0215 10/07/14 0600 10/08/14 0445 10/08/14 1708 10/09/14 0550 10/10/14 0400  CALCIUM 7.1* 7.3* 7.2*  --  7.3* 7.7*  MG 1.9 2.8*  --  2.2  --   --    Sepsis Markers  Recent Labs Lab 10/09/14  1230 10/10/14 0400  PROCALCITON 0.96 0.92   ABG  Recent Labs Lab 10/08/14 0447 10/08/14 2024 10/08/14 2307  PHART 7.399 7.399 7.394  PCO2ART 39.4 38.9 43.2  PO2ART 53.0* 47.0* 60.0*   Liver Enzymes  Recent Labs Lab 10/06/14 1659 10/08/14 0445  AST 180* 138*  ALT 157* 69*  ALKPHOS 61 44  BILITOT 1.4* 1.7*  ALBUMIN 3.9 3.0*   Cardiac Enzymes No results for input(s): TROPONINI, PROBNP in the last 168 hours. Glucose  Recent Labs Lab 10/09/14 0742 10/09/14 1234 10/09/14 1615 10/09/14 1942 10/09/14 2329 10/10/14 0336  GLUCAP 87 87 99 89 98 103*    Imaging 6/16 CXR personally reviewed> L> R bilateral airspace disease about the same today   ASSESSMENT /  PLAN:  PULMONARY Acute hypoxic respiratory failure due to HCAP> improving Tobacco abuse   P:   Continue flutter, IS Add mucinex Out of bed See ID  CARDIOVASCULAR R IJ CVL 6/12>>> Aortic dissection - type A.  S/p repair 6/12 Occluded R iliac artery - s/p fem-fem bypass 6/13 HTN - mild  P:  Pressors, vascular care per VVS, CVTS PRN metoprolol  ASA   RENAL Mild bump in Cr 6/16, likely related to lasix P:   Monitor BMET and UOP Replace electrolytes as needed Would hold lasix this AM   GASTROINTESTINAL Massive L inguinal hernia  P:   PPI  Clear liquids  Gen surgery consult after recovery from vascular surgery  HEMATOLOGIC Leukocytosis  Anemia - mild, no bleeding P:  SCD's  F/u cbc  Transfuse for hgb <7  INFECTIOUS HCAP  P:   Sputum 6/15 > Blood 6/15 >   Vanc 6/15>>> consider d/c 6/17 if culture negative Zosyn 6/15>>>  ENDOCRINE No active issue  P:   F/u glucose on chem   NEUROLOGIC No active issue  P:   Post op pain control with PRN rx     FAMILY  - Updates:  Pt updated at bedside 6/16  - Inter-disciplinary family meet or Palliative Care meeting due by:  6/22   Heber Langhorne [place], MD [place] [facility] Pager: [phone] Cell: [phone] After 3pm or if no response, call [phone]

## 2014-10-10 NOTE — Progress Notes (Signed)
      [address]       [place] [address]             [phone]      Sleeping  BP 117/61 mmHg  Pulse 109  Temp(Src) 101.3 F (38.5 C) (Oral)  Resp 38  Ht 5\' 10"  (1.778 m)  Wt 178 lb 12.7 oz (81.1 kg)  BMI 25.65 kg/m2  SpO2 94%   Intake/Output Summary (Last 24 hours) at 10/10/14 1940 Last data filed at 10/10/14 1900  Gross per 24 hour  Intake 1922.5 ml  Output   2560 ml  Net -637.5 ml    Continue current care  Yaiza Palazzola C. Dorris Fetch, MD [facility] [phone]

## 2014-10-11 ENCOUNTER — Inpatient Hospital Stay (HOSPITAL_COMMUNITY): Payer: Self-pay

## 2014-10-11 LAB — GLUCOSE, CAPILLARY
Glucose-Capillary: 95 mg/dL (ref 65–99)
Glucose-Capillary: 127 mg/dL — ABNORMAL HIGH (ref 65–99)
Glucose-Capillary: 93 mg/dL (ref 65–99)
Glucose-Capillary: 90 mg/dL (ref 65–99)

## 2014-10-11 LAB — CBC WITH DIFFERENTIAL/PLATELET
BASOS PCT: 0 % (ref 0–1)
Basophils Absolute: 0 10*3/uL (ref 0.0–0.1)
EOS ABS: 0.2 10*3/uL (ref 0.0–0.7)
Eosinophils Relative: 1 % (ref 0–5)
HCT: 30.9 % — ABNORMAL LOW (ref 39.0–52.0)
Hemoglobin: 10.4 g/dL — ABNORMAL LOW (ref 13.0–17.0)
Lymphocytes Relative: 9 % — ABNORMAL LOW (ref 12–46)
Lymphs Abs: 1.3 10*3/uL (ref 0.7–4.0)
MCH: 28.2 pg (ref 26.0–34.0)
MCHC: 33.7 g/dL (ref 30.0–36.0)
MCV: 83.7 fL (ref 78.0–100.0)
Monocytes Absolute: 0.8 10*3/uL (ref 0.1–1.0)
Monocytes Relative: 6 % (ref 3–12)
NEUTROS ABS: 11.7 10*3/uL — AB (ref 1.7–7.7)
Neutrophils Relative %: 84 % — ABNORMAL HIGH (ref 43–77)
Platelets: 209 10*3/uL (ref 150–400)
RBC: 3.69 MIL/uL — ABNORMAL LOW (ref 4.22–5.81)
RDW: 14.8 % (ref 11.5–15.5)
WBC: 14 10*3/uL — AB (ref 4.0–10.5)

## 2014-10-11 LAB — BASIC METABOLIC PANEL
Anion gap: 8 (ref 5–15)
BUN: 20 mg/dL (ref 6–20)
CHLORIDE: 104 mmol/L (ref 101–111)
CO2: 26 mmol/L (ref 22–32)
Calcium: 7.7 mg/dL — ABNORMAL LOW (ref 8.9–10.3)
Creatinine, Ser: 1.23 mg/dL (ref 0.61–1.24)
GFR calc Af Amer: >60 mL/min (ref >60)
GFR calc non Af Amer: >60 mL/min (ref >60)
Glucose, Bld: 105 mg/dL — ABNORMAL HIGH (ref 65–99)
Potassium: 3.4 mmol/L — ABNORMAL LOW (ref 3.5–5.1)
SODIUM: 138 mmol/L (ref 135–145)

## 2014-10-11 LAB — PROCALCITONIN: Procalcitonin: 0.63 ng/mL

## 2014-10-11 MED ORDER — POTASSIUM CHLORIDE 10 MEQ/50ML IV SOLN
10.0000 meq | INTRAVENOUS | Status: AC | PRN
  Administered 2014-10-11 (×3): 10 meq via INTRAVENOUS

## 2014-10-11 MED ORDER — CETYLPYRIDINIUM CHLORIDE 0.05 % MT LIQD: 7.0000 mL | Freq: Two times a day (BID) | OROMUCOSAL | Status: DC

## 2014-10-11 NOTE — Progress Notes (Signed)
CT surgery p.m. Rounds  Patient examined and record reviewed.Hemodynamics stable,labs satisfactory.Patient had stable day.Continue current care. Victor [name] 10/11/2014   

## 2014-10-11 NOTE — Progress Notes (Signed)
Utilization review completed.  

## 2014-10-11 NOTE — Progress Notes (Signed)
PULMONARY / CRITICAL CARE MEDICINE   Name: Victor [name] MRN: [id number] DOB: 03/25/1956    ADMISSION DATE:  10/06/2014 CONSULTATION DATE:  10/09/14  REFERRING MD :  Tyrone Sage   CHIEF COMPLAINT:  Respiratory failure   INITIAL PRESENTATION: 59yo male smoker with hx polysubstance abuse otherwise no known PMH, presented 6/12 with chest pain.  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  He underwent emergent repair 6/12.  Post op had R leg pain and diminished pulses and ultimately required return to OR 6/13 for fem-fem bypass r/t R leg ischemia. Extubated after both OR trips but on 6/15 developed worsening hypoxia, increased WOB.  Pt was placed on bipap and PCCM consulted.  STUDIES:  CTA chest/abd/pelvis 6/12>>> Type A aortic dissection with extension into the distal R common iliac artery. the right external iliac artery appears completely occluded proximally.  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  Massive left inguinal hernia containing multiple loops of small bowel. No associated incarceration or obstruction at this time.  SIGNIFICANT EVENTS: 6/12 thoracic aortic aneurysm repair, graft 6/13 left R fem-fem bypass  SUBJECTIVE: O2 weaning well, feeling better, coughing out mucus  VITAL SIGNS: Temp:  [98 F (36.7 C)-101.3 F (38.5 C)] 99.5 F (37.5 C) (06/17 0757) Pulse Rate:  [96-110] 108 (06/17 0700) Resp:  [18-39] 35 (06/17 0700) BP: (96-131)/(48-72) 124/69 mmHg (06/17 0700) SpO2:  [92 %-97 %] 96 % (06/17 0700) FiO2 (%):  [55 %] 55 % (06/16 1400) Weight:  [81.7 kg (180 lb 1.9 oz)] 81.7 kg (180 lb 1.9 oz) (06/17 0500) HEMODYNAMICS:   VENTILATOR SETTINGS: Vent Mode:  [-]  FiO2 (%):  [55 %] 55 % INTAKE / OUTPUT:  Intake/Output Summary (Last 24 hours) at 10/11/14 0917 Last data filed at 10/11/14 0700  Gross per 24 hour  Intake   1730 ml  Output   1485 ml  Net    245 ml     PHYSICAL EXAMINATION:  Gen: sitting up in bed, normal respiratory effort HENT: OP clear, NCAT PULM: continued crackles left, normal effort CV: RRR, no mgr, no edema, midline scar healing well GI: BS+, soft, nontender Derm: no cyanosis or rash Psyche: normal mood and affect   LABS:  CBC  Recent Labs Lab 10/09/14 0550 10/10/14 0400 10/11/14 0400  WBC 18.0* 14.7* 14.0*  HGB 10.0* 10.5* 10.4*  HCT 30.4* 31.6* 30.9*  PLT 138* 167 209   Coag's  Recent Labs Lab 10/06/14 1830 10/06/14 2355 10/07/14 0215  APTT 31 >200* 35  INR 1.29 2.76* 1.59*   BMET  Recent Labs Lab 10/09/14 0550 10/10/14 0400 10/11/14 0400  NA 136 137 138  K 3.9 3.3* 3.4*  CL 101 100* 104  CO2 27 28 26   BUN 22* 22* 20  CREATININE 1.24 1.28* 1.23  GLUCOSE 110* 102* 105*   Electrolytes  Recent Labs Lab 10/07/14 0215 10/07/14 0600  10/08/14 1708 10/09/14 0550 10/10/14 0400 10/11/14 0400  CALCIUM 7.1* 7.3*  < >  --  7.3* 7.7* 7.7*  MG 1.9 2.8*  --  2.2  --   --   --   < > = values in this interval not displayed. Sepsis Markers  Recent Labs Lab 10/09/14 1230 10/10/14 0400 10/11/14 0400  PROCALCITON 0.96 0.92 0.63   ABG  Recent Labs Lab 10/08/14 0447 10/08/14 2024 10/08/14 2307  PHART 7.399 7.399 7.394  PCO2ART 39.4 38.9 43.2  PO2ART 53.0* 47.0* 60.0*   Liver Enzymes  Recent Labs Lab 10/06/14 1659 10/08/14 0445  AST 180* 138*  ALT 157* 69*  ALKPHOS 61 44  BILITOT 1.4* 1.7*  ALBUMIN 3.9 3.0*   Cardiac Enzymes No results for input(s): TROPONINI, PROBNP in the last 168 hours. Glucose  Recent Labs Lab 10/10/14 0336 10/10/14 0809 10/10/14 1224 10/10/14 1821 10/10/14 2002 10/11/14 0755  GLUCAP 103* 150* 113* 83 99 95    Imaging 6/17 CXR personally reviewed> L> R bilateral airspace disease unchanged   ASSESSMENT / PLAN:  PULMONARY Acute hypoxic respiratory failure due to HCAP> improving Tobacco abuse   P:   Continue flutter, IS Continue mucinex  bid Out of bed See ID  CARDIOVASCULAR R IJ CVL 6/12>>> Aortic dissection - type A.  S/p repair 6/12 Occluded R iliac artery - s/p fem-fem bypass 6/13 HTN - mild  P:  Per VVS, CVTS PRN metoprolol  ASA   RENAL Mild bump in Cr 6/16, likely related to lasix P:   Monitor BMET and UOP Replace electrolytes as needed Would hold lasix this AM  GASTROINTESTINAL L inguinal hernia  P:   PPI  Advance as tolerated  Gen surgery consult after recovery from vascular surgery  HEMATOLOGIC Leukocytosis  Anemia - mild, no bleeding P:  SCD's  F/u cbc  Transfuse for hgb <7  INFECTIOUS HCAP  Fever 6/17/persistent leukocytosis > could be due to pneumonia (not uncommon to still be febrile 3-5 days into antibiotic course); wounds look OK P:   Sputum 6/15 > Blood 6/15 >   Vanc 6/15>>> maintain today given fever Zosyn 6/15>>>  Remove central lines  ENDOCRINE No active issue  P:   F/u glucose on chem   NEUROLOGIC No active issue  P:   Post op pain control with PRN rx    FAMILY  - Updates:  Pt updated at bedside 6/17  - Inter-disciplinary family meet or Palliative Care meeting due by:  6/22   Heber Forsyth, MD [place] [facility] Pager: [phone] Cell: [phone] After 3pm or if no response, call [phone]

## 2014-10-11 NOTE — Progress Notes (Addendum)
[facility] Progress Note  Subjective Denies pain or numbness in feet   Objective Filed Vitals:   10/11/14 0757  BP:   Pulse:   Temp: 99.5 F (37.5 C)  Resp:    Tmax 101.3 BP sys 90s-130s 02 97% 4L  Intake/Output Summary (Last 24 hours) at 10/11/14 1024 Last data filed at 10/11/14 1000  Gross per 24 hour  Intake   2010 ml  Output   1410 ml  Net    600 ml   Bilateral groin wounds are clean and intact. No hematoma present 2+ DP pulses bilaterally  Assessment/Planning: 59 y.o. male is s/p: left femorofemoral bypass 4 Days Post-Op   Bypass is patent. Easily palpable pedal pulses. Wounds are healing well.  Raymond Gurney 10/11/2014 10:24 AM --  Laboratory CBC    Component Value Date/Time   WBC 14.0* 10/11/2014 0400   HGB 10.4* 10/11/2014 0400   HCT 30.9* 10/11/2014 0400   PLT 209 10/11/2014 0400    BMET    Component Value Date/Time   NA 138 10/11/2014 0400   K 3.4* 10/11/2014 0400   CL 104 10/11/2014 0400   CO2 26 10/11/2014 0400   GLUCOSE 105* 10/11/2014 0400   BUN 20 10/11/2014 0400   CREATININE 1.23 10/11/2014 0400   CALCIUM 7.7* 10/11/2014 0400   GFRNONAA >60 10/11/2014 0400   GFRAA >60 10/11/2014 0400    COAG Lab Results  Component Value Date   INR 1.59* 10/07/2014   INR 2.76* 10/06/2014   INR 1.29 10/06/2014   No results found for: PTT  Antibiotics Anti-infectives    Start     Dose/Rate Route Frequency Ordered Stop   10/09/14 2300  vancomycin (VANCOCIN) IVPB 1000 mg/200 mL premix     1,000 mg 200 mL/hr over 60 Minutes Intravenous Every 12 hours 10/09/14 1027     10/09/14 1400  piperacillin-tazobactam (ZOSYN) IVPB 3.375 g     3.375 g 12.5 mL/hr over 240 Minutes Intravenous 3 times per day 10/09/14 1027     10/09/14 1100  vancomycin (VANCOCIN) 1,500 mg in sodium chloride 0.9 % 500 mL IVPB     1,500 mg 250 mL/hr over 120 Minutes Intravenous  Once 10/09/14 1027 10/09/14 1330   10/07/14 1615  cefUROXime (ZINACEF)  1.5 g in dextrose 5 % 50 mL IVPB     1.5 g 100 mL/hr over 30 Minutes Intravenous To Surgery 10/07/14 1606 10/08/14 1615   10/07/14 0800  cefUROXime (ZINACEF) 1.5 g in dextrose 5 % 50 mL IVPB     1.5 g 100 mL/hr over 30 Minutes Intravenous Every 12 hours 10/07/14 0211 10/08/14 2012   10/07/14 0600  vancomycin (VANCOCIN) IVPB 1000 mg/200 mL premix     1,000 mg 200 mL/hr over 60 Minutes Intravenous  Once 10/07/14 0211 10/07/14 0713   10/06/14 1830  vancomycin (VANCOCIN) 1,250 mg in sodium chloride 0.9 % 250 mL IVPB     1,250 mg 166.7 mL/hr over 90 Minutes Intravenous To Surgery 10/06/14 1828 10/06/14 1942   10/06/14 1830  cefUROXime (ZINACEF) 1.5 g in dextrose 5 % 50 mL IVPB     1.5 g 100 mL/hr over 30 Minutes Intravenous To Surgery 10/06/14 1828 10/06/14 1944   10/06/14 1830  cefUROXime (ZINACEF) 750 mg in dextrose 5 % 50 mL IVPB  Status:  Discontinued     750 mg 100 mL/hr over 30 Minutes Intravenous To Surgery 10/06/14 1824 10/07/14 0203       Maris Berger, PA-C  [facility] Office: [phone] Pager: [phone] 10/11/2014 10:24 AM     I have examined the patient, reviewed and agree with above. Doing well from standpoint of his bypass. I will see again on Monday. Please contact my partners for any peripheral vascular concerns over the weekend.  Gretta Began, MD 10/11/2014 10:45 AM

## 2014-10-11 NOTE — Progress Notes (Signed)
Physical Therapy Treatment Patient Details Name: GIANO LULAY MRN: [id number] DOB: 25-Sep-1955 Today's Date: 10/11/2014    History of Present Illness 59 y.o. male s/p THORACIC ASCENDING ANEURYSM REPAIR , and LEFT FEMORAL ARTERY -RIGHT FEMORAL ARTERY BYPASS GRAFT.     PT Comments    Pt admitted with above diagnosis. Pt currently with functional limitations due to balance and endurance deficits.  Pt with bil knee buckling needing min to mod assist to recover.  Will need NHP most likely to reach full potential.  Will continue PT.   Pt will benefit from skilled PT to increase their independence and safety with mobility to allow discharge to the venue listed below.    Follow Up Recommendations  SNF;Supervision/Assistance - 24 hour     Equipment Recommendations  Rolling walker with 5" wheels    Recommendations for Other Services OT consult     Precautions / Restrictions Precautions Precautions: Sternal Precaution Comments: monitor sats Restrictions Other Position/Activity Restrictions: sternal precautions    Mobility  Bed Mobility Overal bed mobility: Needs Assistance;+2 for physical assistance Bed Mobility: Rolling;Sidelying to Sit Rolling: Min assist Sidelying to sit: Min assist;+2 for safety/equipment;HOB elevated       General bed mobility comments: min assist for truncal and LE support. VC for technique and to maintain sternal precautions. +2 for equipment management  Transfers Overall transfer level: Needs assistance Equipment used:  (pushing wheelchair) Transfers: Sit to/from Stand Sit to Stand: Min assist;+2 safety/equipment         General transfer comment: Min assist for boost and balance to stand from lowest bed setting. +2 for safety and equipment management of lines/leads. VC for technique, uses rocking momentum to rise from bed with cues to push on knees. Cues for sequencing and min assist for balance with sit to stand.  Poor safety awareness.    Ambulation/Gait Ambulation/Gait assistance: Min assist;Mod assist;+2 safety/equipment Ambulation Distance (Feet): 140 Feet (80 feet then 60 feet) Assistive device:  (pushing wheelchair) Gait Pattern/deviations: Step-to pattern;Decreased stride length;Shuffle;Trunk flexed   Gait velocity interpretation: Below normal speed for age/gender General Gait Details: Pt ambulated with wheelchair with min to mod steadying assist needed.  Pt with bil knee buckling needing mod assist at times.  Pt with flexed hips and trunk overall with inatbility to sustain upright unless cued.     Stairs            Wheelchair Mobility    Modified Rankin (Stroke Patients Only)       Balance Overall balance assessment: Needs assistance;History of Falls Sitting-balance support: No upper extremity supported;Feet supported Sitting balance-Leahy Scale: Fair     Standing balance support: Bilateral upper extremity supported;During functional activity Standing balance-Leahy Scale: Poor Standing balance comment: Needs Ue support for standing.                     Cognition Arousal/Alertness: Awake/alert Behavior During Therapy: WFL for tasks assessed/performed Overall Cognitive Status: Within Functional Limits for tasks assessed                      Exercises General Exercises - Lower Extremity Ankle Circles/Pumps: AROM;Both;10 reps;Seated Long Arc Quad: AAROM;Both;Strengthening;10 reps;Seated Hip Flexion/Marching: Strengthening;Both;10 reps;Standing    General Comments General comments (skin integrity, edema, etc.): 108 bpm, 119/57, 93% on 4LO2 with ambulation.       Pertinent Vitals/Pain Pain Assessment: Faces Faces Pain Scale: Hurts little more Pain Location: abdomen Pain Descriptors / Indicators: Grimacing;Guarding;Sore Pain Intervention(s): Limited activity within  patient's tolerance;Monitored during session;Repositioned  VSS    Home Living                       Prior Function            PT Goals (current goals can now be found in the care plan section) Acute Rehab PT Goals Patient Stated Goal: Go home Progress towards PT goals: Progressing toward goals    Frequency  Min 3X/week    PT Plan Discharge plan needs to be updated    Co-evaluation             End of Session Equipment Utilized During Treatment: Gait belt;Oxygen Activity Tolerance: Patient limited by fatigue Patient left: in chair;with call bell/phone within reach     Time: 1037-1101 PT Time Calculation (min) (ACUTE ONLY): 24 min  Charges:  $Gait Training: 8-22 mins $Therapeutic Exercise: 8-22 mins                    G Codes:      Sanvi Ehler, Alvis Lemmings F 2014-10-16, 2:50 PM [employer] Acute Rehabilitation [phone] (pager)

## 2014-10-11 NOTE — Progress Notes (Signed)
ANTIBIOTIC CONSULT NOTE - INITIAL  Pharmacy Consult for Vancomycin and Zosyn Indication: HCAP  No Known Allergies  Patient Measurements: Height:  (177.8 cm) Weight: 180 lb 1.9 oz (81.7 kg) IBW/kg (Calculated) : 73  Vital Signs: Temp: 99.5 F (37.5 C) (06/17 0757) Temp Source: Oral (06/17 0757) BP: 124/69 mmHg (06/17 0700) Pulse Rate: 108 (06/17 0700) Intake/Output from previous day: 06/16 0701 - 06/17 0700 In: 1870 [P.O.:720; I.V.:400; IV Piggyback:750] Out: 1610 [Urine:1610] Intake/Output from this shift:    Labs:  Recent Labs  10/09/14 0550 10/10/14 0400 10/11/14 0400  WBC 18.0* 14.7* 14.0*  HGB 10.0* 10.5* 10.4*  PLT 138* 167 209  CREATININE 1.24 1.28* 1.23   Estimated Creatinine Clearance: 67.6 mL/min (by C-G formula based on Cr of 1.23). No results for input(s): VANCOTROUGH, VANCOPEAK, VANCORANDOM, GENTTROUGH, GENTPEAK, GENTRANDOM, TOBRATROUGH, TOBRAPEAK, TOBRARND, AMIKACINPEAK, AMIKACINTROU, AMIKACIN in the last 72 hours.   Microbiology: Recent Results (from the past 720 hour(s))  MRSA PCR Screening     Status: None   Collection Time: 10/07/14  4:33 AM  Result Value Ref Range Status   MRSA by PCR NEGATIVE NEGATIVE Final    Comment:        The GeneXpert MRSA Assay (FDA approved for NASAL specimens only), is one component of a comprehensive MRSA colonization surveillance program. It is not intended to diagnose MRSA infection nor to guide or monitor treatment for MRSA infections.   Culture, blood (routine x 2)     Status: None (Preliminary result)   Collection Time: 10/09/14 11:00 AM  Result Value Ref Range Status   Specimen Description BLOOD RIGHT HAND  Final   Special Requests BOTTLES DRAWN AEROBIC ONLY 3CCS  Final   Culture NO GROWTH < 24 HOURS  Final   Report Status PENDING  Incomplete  Culture, blood (routine x 2)     Status: None (Preliminary result)   Collection Time: 10/09/14 11:18 AM  Result Value Ref Range Status   Specimen  Description BLOOD RIGHT HAND  Final   Special Requests BOTTLES DRAWN AEROBIC ONLY 1CCS  Final   Culture NO GROWTH < 24 HOURS  Final   Report Status PENDING  Incomplete    Medical History: Past Medical History  Diagnosis Date  . Thoracic aortic aneurysm 09/2014    Medications:  Scheduled:  . acetaminophen  1,000 mg Oral 4 times per day   Or  . acetaminophen (TYLENOL) oral liquid 160 mg/5 mL  1,000 mg Per Tube 4 times per day  . antiseptic oral rinse  7 mL Mouth Rinse BID  . aspirin EC  325 mg Oral Daily   Or  . aspirin  324 mg Per Tube Daily  . bisacodyl  10 mg Oral Daily   Or  . bisacodyl  10 mg Rectal Daily  . docusate sodium  200 mg Oral Daily  . enoxaparin (LOVENOX) injection  30 mg Subcutaneous Q24H  . guaiFENesin  600 mg Oral BID  . insulin aspart  0-15 Units Subcutaneous TID WC  . metoprolol tartrate  12.5 mg Oral BID   Or  . metoprolol tartrate  12.5 mg Per Tube BID  . pantoprazole  40 mg Oral Daily  . piperacillin-tazobactam (ZOSYN)  IV  3.375 g Intravenous 3 times per day  . sodium chloride  3 mL Intravenous Q12H  . vancomycin  1,000 mg Intravenous Q12H   Infusions:  . sodium chloride    . sodium chloride 20 mL/hr at 10/07/14 0200  . DOPamine Stopped (  10/09/14 1100)  . lactated ringers 20 mL/hr at 10/11/14 0400   Assessment: 59 yo M admitted on 10/06/2014 with sudden onset midsternal CP radiating through back. Subsequent CTa c/w type A aortic dissection now s/p repair on 6/13. Pharmacy consulted to dose vancomycin/zosyn (day #3) for suspected HCAP with worsening hypoxia and increasing patchy infiltrate in left med lung on CXR. Tmax 101.3, WBC trend down to 14, pct trend down 0.92>0.63, SCr stable at 1.23 (CrCl ~67 ml/min).  Vanc 6/15>>  Zosyn 6/15>>  6/15 BCx2>>ngtd 6/15 sputum cx>>   Goal of Therapy:  Vancomycin trough level 15-20 mcg/ml  Plan:  - Vancomycin 1000 mg IV q12h  - Zosyn 3.375 gm IV q8h (4 hr infusion)  - Monitor renal function, temp, WBC,  pct, C&S, VT as indicated  Dariel Pellecchia K. Bonnye Fava, PharmD, BCPS Clinical Pharmacist - Resident Pager: [phone] Pharmacy: [phone] 10/11/2014 9:27 AM

## 2014-10-11 NOTE — Progress Notes (Addendum)
[facility] DAILY ICU PROGRESS NOTE                   [address]            [place] [id number]          [phone]   4 Days Post-Op Procedure(s) (LRB): LEFT  FEMORAL ARTERY -RIGHT FEMORAL ARTERY BYPASS GRAFT USING X 30 CM HEMASHIELD GOLD GRAFT (N/A)  Total Length of Stay:  LOS: 4 days   Subjective: Comfortable, no complaints.   Objective: Vital signs in last 24 hours: Temp:  [98 F (36.7 C)-101.3 F (38.5 C)] 99.5 F (37.5 C) (06/17 0757) Pulse Rate:  [96-110] 108 (06/17 0700) Cardiac Rhythm:  [-] Sinus tachycardia (06/17 0800) Resp:  [18-39] 35 (06/17 0700) BP: (98-131)/(56-72) 124/69 mmHg (06/17 0700) SpO2:  [92 %-97 %] 96 % (06/17 0700) FiO2 (%):  [55 %] 55 % (06/16 1400) Weight:  [180 lb 1.9 oz (81.7 kg)] 180 lb 1.9 oz (81.7 kg) (06/17 0500)  Filed Weights   10/09/14 0500 10/10/14 0500 10/11/14 0500  Weight: 178 lb 9.2 oz (81 kg) 178 lb 12.7 oz (81.1 kg) 180 lb 1.9 oz (81.7 kg)  PRE-OPERATIVE WEIGHT: 77.1 kg  Weight change: 1 lb 5.2 oz (0.6 kg)   Hemodynamic parameters for last 24 hours:    Intake/Output from previous day: 06/16 0701 - 06/17 0700 In: 1870 [P.O.:720; I.V.:400; IV Piggyback:750] Out: 1610 [Urine:1610]  Intake/Output this shift: Total I/O In: 300 [IV Piggyback:300] Out: 150 [Urine:150]  CBGs 99-105-95  Current Meds: Scheduled Meds: . acetaminophen  1,000 mg Oral 4 times per day   Or  . acetaminophen (TYLENOL) oral liquid 160 mg/5 mL  1,000 mg Per Tube 4 times per day  . antiseptic oral rinse  7 mL Mouth Rinse BID  . aspirin EC  325 mg Oral Daily   Or  . aspirin  324 mg Per Tube Daily  . bisacodyl  10 mg Oral Daily   Or  . bisacodyl  10 mg Rectal Daily  . docusate sodium  200 mg Oral Daily  . enoxaparin (LOVENOX) injection  30 mg Subcutaneous Q24H  . guaiFENesin  600 mg Oral BID  . insulin aspart  0-15 Units Subcutaneous TID WC  . metoprolol tartrate  12.5 mg Oral BID   Or  . metoprolol tartrate  12.5 mg Per Tube  BID  . pantoprazole  40 mg Oral Daily  . piperacillin-tazobactam (ZOSYN)  IV  3.375 g Intravenous 3 times per day  . sodium chloride  3 mL Intravenous Q12H  . vancomycin  1,000 mg Intravenous Q12H   Continuous Infusions: . sodium chloride    . sodium chloride 20 mL/hr at 10/07/14 0200  . DOPamine Stopped (10/09/14 1100)  . lactated ringers 20 mL/hr at 10/11/14 0400   PRN Meds:.levalbuterol, metoprolol, morphine injection, ondansetron (ZOFRAN) IV, oxyCODONE, sodium chloride, traMADol  Physical Exam: General appearance: alert, cooperative and no distress Heart: regular rate and rhythm Lungs: diminished breath sounds bilaterally Extremities: Mild LE edema, palpable pedal pulses Wound: Clean and dry    Lab Results: CBC: Recent Labs  10/10/14 0400 10/11/14 0400  WBC 14.7* 14.0*  HGB 10.5* 10.4*  HCT 31.6* 30.9*  PLT 167 209   BMET:  Recent Labs  10/10/14 0400 10/11/14 0400  NA 137 138  K 3.3* 3.4*  CL 100* 104  CO2 28 26  GLUCOSE 102* 105*  BUN 22* 20  CREATININE 1.28* 1.23  CALCIUM  7.7* 7.7*    PT/INR: No results for input(s): LABPROT, INR in the last 72 hours. Radiology: Dg Chest Port 1 View  10/11/2014   CLINICAL DATA:  Aspiration pneumonia  EXAM: PORTABLE CHEST - 1 VIEW  COMPARISON:  10/10/2014  FINDINGS: Cardiomediastinal silhouette is stable. Status post median sternotomy. Again noted 2 right IJ catheters unchanged in position. Bilateral asymmetric infiltrate/consolidation are again noted. Small right pleural effusion. No pneumothorax.  IMPRESSION: Again noted bilateral asymmetric infiltrate/consolidation left greater than right. Small right pleural effusion. Stable right IJ catheters. No pneumothorax.   Electronically Signed   By: Natasha Mead M.D.   On: 10/11/2014 08:12     Assessment/Plan: S/P Procedure(s) (LRB): LEFT  FEMORAL ARTERY -RIGHT FEMORAL ARTERY BYPASS GRAFT USING X 30 CM HEMASHIELD GOLD GRAFT (N/A)  CV- Mildly tachy, BPs stable and improving.  Continue low dose beta blocker and titrate as able.  PV- s/p Fem/fem bypass. Palpable pulses, VVS following.  Pulm- Continue aggressive pulm toilet, wean O2 as able. Per CCM.  Leukocytosis, no fever. Continue empiric Vanc/Zosyn for presumed pneumonia.  Hypokalemia- replace K+.  Renal- Cr stable. Continue to monitor.  Mobilize as tolerated.  Hopefully can d/c Foley and possibly tx to stepdown today.  COLLINS,GINA H 10/11/2014 11:39 AM Patient seen and examined, agree with above Dc foley To stepdown tomorrow if he continues to progress  Viviann Spare C. Dorris Fetch, MD [facility] [phone]

## 2014-10-12 ENCOUNTER — Inpatient Hospital Stay (HOSPITAL_COMMUNITY): Payer: Self-pay

## 2014-10-12 LAB — GLUCOSE, CAPILLARY
Glucose-Capillary: 99 mg/dL (ref 65–99)
Glucose-Capillary: 100 mg/dL — ABNORMAL HIGH (ref 65–99)
Glucose-Capillary: 99 mg/dL (ref 65–99)
Glucose-Capillary: 99 mg/dL (ref 65–99)

## 2014-10-12 MED ORDER — METOPROLOL TARTRATE 25 MG/10 ML ORAL SUSPENSION
12.5000 mg | Freq: Two times a day (BID) | ORAL | Status: DC
  Administered 2014-10-13: 12.5 mg
  Filled 2014-10-12 (×7): qty 5

## 2014-10-12 MED ORDER — METOPROLOL TARTRATE 25 MG PO TABS
25.0000 mg | ORAL_TABLET | Freq: Once | ORAL | Status: AC
  Administered 2014-10-12: 25 mg via ORAL

## 2014-10-12 MED ORDER — ACETAMINOPHEN 500 MG PO TABS
1000.0000 mg | ORAL_TABLET | Freq: Four times a day (QID) | ORAL | Status: DC
  Administered 2014-10-12 – 2014-10-15 (×9): 1000 mg via ORAL
  Filled 2014-10-12 (×13): qty 2

## 2014-10-12 MED ORDER — SORBITOL 70 % PO SOLN
60.0000 mL | Freq: Every morning | ORAL | Status: DC
  Filled 2014-10-12 (×2): qty 60

## 2014-10-12 MED ORDER — SORBITOL 70 % PO SOLN
60.0000 mL | Freq: Every morning | ORAL | Status: AC
  Administered 2014-10-12: 60 mL via ORAL
  Filled 2014-10-12 (×3): qty 60

## 2014-10-12 MED ORDER — METOPROLOL TARTRATE 25 MG PO TABS
25.0000 mg | ORAL_TABLET | Freq: Two times a day (BID) | ORAL | Status: DC
  Administered 2014-10-12 – 2014-10-15 (×5): 25 mg via ORAL
  Filled 2014-10-12 (×8): qty 1

## 2014-10-12 NOTE — Progress Notes (Signed)
5 Days Post-Op Procedure(s) (LRB): LEFT  FEMORAL ARTERY -RIGHT FEMORAL ARTERY BYPASS GRAFT USING X 30 CM HEMASHIELD GOLD GRAFT (N/A) Subjective: Postop ARDS- aspiration- CXR stable, on 2 L O2 Walking in hall Objective: Vital signs in last 24 hours: Temp:  [98.2 F (36.8 C)-100.3 F (37.9 C)] 99.5 F (37.5 C) (06/18 0800) Pulse Rate:  [93-105] 102 (06/18 0900) Cardiac Rhythm:  [-] Sinus tachycardia (06/18 0800) Resp:  [17-43] 27 (06/18 0900) BP: (90-120)/(53-69) 104/58 mmHg (06/18 0900) SpO2:  [92 %-98 %] 96 % (06/18 0900) Weight:  [180 lb 12.4 oz (82 kg)] 180 lb 12.4 oz (82 kg) (06/18 0500)  Hemodynamic parameters for last 24 hours:  nsr  Intake/Output from previous day: 06/17 0701 - 06/18 0700 In: 1670 [P.O.:660; I.V.:360; IV Piggyback:650] Out: 1175 [Urine:1175] Intake/Output this shift:    Legs warm Sternum clean,dry  Lab Results:  Recent Labs  10/10/14 0400 10/11/14 0400  WBC 14.7* 14.0*  HGB 10.5* 10.4*  HCT 31.6* 30.9*  PLT 167 209   BMET:  Recent Labs  10/10/14 0400 10/11/14 0400  NA 137 138  K 3.3* 3.4*  CL 100* 104  CO2 28 26  GLUCOSE 102* 105*  BUN 22* 20  CREATININE 1.28* 1.23  CALCIUM 7.7* 7.7*    PT/INR: No results for input(s): LABPROT, INR in the last 72 hours. ABG    Component Value Date/Time   PHART 7.394 10/08/2014 2307   HCO3 26.5* 10/08/2014 2307   TCO2 28 10/08/2014 2307   ACIDBASEDEF 1.0 10/08/2014 2024   O2SAT 91.0 10/08/2014 2307   CBG (last 3)   Recent Labs  10/11/14 1643 10/11/14 2210 10/12/14 0817  GLUCAP 93 90 99    Assessment/Plan: S/P Procedure(s) (LRB): LEFT  FEMORAL ARTERY -RIGHT FEMORAL ARTERY BYPASS GRAFT USING X 30 CM HEMASHIELD GOLD GRAFT (N/A) Mobilize Continue ABX therapy due to Post-op infection Plan for transfer to step-down: see transfer orders   LOS: 5 days    Victor [name] 10/12/2014

## 2014-10-12 NOTE — Progress Notes (Signed)
RN attempted to update family on transfer of pt to 2W25. No answer from family. Will try again at a later time.

## 2014-10-12 NOTE — Progress Notes (Signed)
   VASCULAR SURGERY ASSESSMENT & PLAN:  * s/p left to right femfem bypass. Graft is patent.  *  Following peripherally.   SUBJECTIVE: No complaints.   PHYSICAL EXAM: Filed Vitals:   10/12/14 0500 10/12/14 0600 10/12/14 0700 10/12/14 0800  BP: 120/63 117/64 108/54 109/61  Pulse: 102 105 103 102  Temp:    99.5 F (37.5 C)  TempSrc:    Oral  Resp: 38 27 24 22   Height:      Weight: 180 lb 12.4 oz (82 kg)     SpO2: 95% 94% 96% 97%   Groin incisions look good Palpable DP pulses.   LABS: Lab Results  Component Value Date   WBC 14.0* 10/11/2014   HGB 10.4* 10/11/2014   HCT 30.9* 10/11/2014   MCV 83.7 10/11/2014   PLT 209 10/11/2014   Lab Results  Component Value Date   CREATININE 1.23 10/11/2014   Lab Results  Component Value Date   INR 1.59* 10/07/2014   CBG (last 3)   Recent Labs  10/11/14 1226 10/11/14 1643 10/11/14 2210  GLUCAP 127* 93 90    Principal Problem:   Aortic dissection Active Problems:   Hospital-acquired pneumonia   Ischemic leg   Victor [name] Beeper: [phone] 10/12/2014

## 2014-10-12 NOTE — Progress Notes (Signed)
PULMONARY / CRITICAL CARE MEDICINE   Name: Victor [name] MRN: [id number] DOB: May 02, 1955    ADMISSION DATE:  10/06/2014 CONSULTATION DATE:  10/09/14  REFERRING MD :  Tyrone Sage   CHIEF COMPLAINT:  Respiratory failure   INITIAL PRESENTATION: 59yo male smoker with hx polysubstance abuse otherwise no known PMH, presented 6/12 with chest pain.  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  He underwent emergent repair 6/12.  Post op had R leg pain and diminished pulses and ultimately required return to OR 6/13 for fem-fem bypass r/t R leg ischemia. Extubated after both OR trips but on 6/15 developed worsening hypoxia, increased WOB.  Pt was placed on bipap and PCCM consulted.  STUDIES:  CTA chest/abd/pelvis 6/12>>> Type A aortic dissection with extension into the distal R common iliac artery. the right external iliac artery appears completely occluded proximally.  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  Massive left inguinal hernia containing multiple loops of small bowel. No associated incarceration or obstruction at this time.  SIGNIFICANT EVENTS: 6/12 thoracic aortic aneurysm repair, graft 6/13 left R fem-fem bypass  SUBJECTIVE:  Denies discomfort today Some cough, less sputum  VITAL SIGNS: Temp:  [98.2 F (36.8 C)-100.3 F (37.9 C)] 100.3 F (37.9 C) (06/18 0400) Pulse Rate:  [93-110] 103 (06/18 0700) Resp:  [17-43] 24 (06/18 0700) BP: (90-129)/(53-69) 108/54 mmHg (06/18 0700) SpO2:  [92 %-98 %] 96 % (06/18 0700) Weight:  [82 kg (180 lb 12.4 oz)] 82 kg (180 lb 12.4 oz) (06/18 0500) HEMODYNAMICS:   VENTILATOR SETTINGS:   INTAKE / OUTPUT:  Intake/Output Summary (Last 24 hours) at 10/12/14 0812 Last data filed at 10/12/14 0700  Gross per 24 hour  Intake   1620 ml  Output   1125 ml  Net    495 ml    PHYSICAL EXAMINATION:  Gen: in bed, normal respiratory effort HENT: OP  clear, NCAT PULM: continued crackles left, normal effort, 2L/min CV: RRR, no mgr, no edema, midline scar healing well GI: BS+, soft, nontender Derm: no cyanosis or rash Psyche: normal mood and affect   LABS:  CBC  Recent Labs Lab 10/09/14 0550 10/10/14 0400 10/11/14 0400  WBC 18.0* 14.7* 14.0*  HGB 10.0* 10.5* 10.4*  HCT 30.4* 31.6* 30.9*  PLT 138* 167 209   Coag's  Recent Labs Lab 10/06/14 1830 10/06/14 2355 10/07/14 0215  APTT 31 >200* 35  INR 1.29 2.76* 1.59*   BMET  Recent Labs Lab 10/09/14 0550 10/10/14 0400 10/11/14 0400  NA 136 137 138  K 3.9 3.3* 3.4*  CL 101 100* 104  CO2 BUN 22* 22* 20  CREATININE 1.24 1.28* 1.23  GLUCOSE 110* 102* 105*   Electrolytes  Recent Labs Lab 10/07/14 0215 10/07/14 0600  10/08/14 1708 10/09/14 0550 10/10/14 0400 10/11/14 0400  CALCIUM 7.1* 7.3*  < >  --  7.3* 7.7* 7.7*  MG 1.9 2.8*  --  2.2  --   --   --   < > = values in this interval not displayed. Sepsis Markers  Recent Labs Lab 10/09/14 1230 10/10/14 0400 10/11/14 0400  PROCALCITON 0.96 0.92 0.63   ABG  Recent Labs Lab 10/08/14 0447 10/08/14 2024 10/08/14 2307  PHART 7.399 7.399 7.394  PCO2ART 39.4 38.9 43.2  PO2ART 53.0* 47.0* 60.0*   Liver Enzymes  Recent Labs Lab 10/06/14 1659 10/08/14 0445  AST 180* 138*  ALT 157* 69*  ALKPHOS 61 44  BILITOT 1.4* 1.7*  ALBUMIN 3.9 3.0*   Cardiac Enzymes No results for input(s): TROPONINI, PROBNP in the last 168 hours. Glucose  Recent Labs Lab 10/10/14 1821 10/10/14 2002 10/11/14 0755 10/11/14 1226 10/11/14 1643 10/11/14 2210  GLUCAP 83 99 95 127* 93 90    Imaging 6/18 CXR reviewed by me > persistent L > R infiltrates but better aeration at B bases   ASSESSMENT / PLAN:  PULMONARY Acute hypoxic respiratory failure due to HCAP> improving Tobacco abuse   P:   Continue flutter, IS Continue mucinex bid Out of bed See ID  CARDIOVASCULAR R IJ CVL 6/12>>> Aortic  dissection - type A.  S/p repair 6/12 Occluded R iliac artery - s/p fem-fem bypass 6/13 HTN - mild  P:  Per VVS, CVTS PRN metoprolol  ASA   RENAL Mild bump in Cr 6/16, likely related to lasix P:   Monitor BMET and UOP Replace electrolytes as needed Would hold lasix this AM  GASTROINTESTINAL L inguinal hernia  P:   PPI  Advance as tolerated  Gen surgery consult after recovery from vascular surgery  HEMATOLOGIC Leukocytosis  Anemia - mild, no bleeding P:  SCD's  F/u cbc  Transfuse for hgb <7  INFECTIOUS HCAP  Fever 6/17/persistent leukocytosis > could be due to pneumonia (not uncommon to still be febrile 3-5 days into antibiotic course); wounds look OK P:   Sputum 6/15 > Blood 6/15 >   Vanc 6/15>>>  Zosyn 6/15>>>  Follow cx on current regimen, consider d/c vanco 6/19  ENDOCRINE No active issue  P:   F/u glucose on chem   NEUROLOGIC No active issue  P:   Post op pain control with PRN rx    FAMILY  - Updates:  Pt updated at bedside 6/17, 6/18  - Inter-disciplinary family meet or Palliative Care meeting due by:  6/22   Levy Pupa, MD, PhD 10/12/2014, 8:17 AM [facility] [phone] or if no answer [phone]

## 2014-10-13 ENCOUNTER — Inpatient Hospital Stay (HOSPITAL_COMMUNITY): Payer: Self-pay

## 2014-10-13 LAB — CBC
HCT: 28.2 % — ABNORMAL LOW (ref 39.0–52.0)
Hemoglobin: 9.6 g/dL — ABNORMAL LOW (ref 13.0–17.0)
MCH: 27.7 pg (ref 26.0–34.0)
MCHC: 34 g/dL (ref 30.0–36.0)
MCV: 81.3 fL (ref 78.0–100.0)
Platelets: 262 10*3/uL (ref 150–400)
RBC: 3.47 MIL/uL — ABNORMAL LOW (ref 4.22–5.81)
RDW: 15 % (ref 11.5–15.5)
WBC: 12.1 10*3/uL — ABNORMAL HIGH (ref 4.0–10.5)

## 2014-10-13 LAB — BASIC METABOLIC PANEL
Anion gap: 9 (ref 5–15)
BUN: 18 mg/dL (ref 6–20)
CO2: 22 mmol/L (ref 22–32)
Calcium: 7.9 mg/dL — ABNORMAL LOW (ref 8.9–10.3)
Chloride: 107 mmol/L (ref 101–111)
Creatinine, Ser: 1.23 mg/dL (ref 0.61–1.24)
GFR calc Af Amer: >60 mL/min (ref >60)
GFR calc non Af Amer: >60 mL/min (ref >60)
Glucose, Bld: 102 mg/dL — ABNORMAL HIGH (ref 65–99)
Potassium: 3.3 mmol/L — ABNORMAL LOW (ref 3.5–5.1)
Sodium: 138 mmol/L (ref 135–145)

## 2014-10-13 LAB — GLUCOSE, CAPILLARY
Glucose-Capillary: 102 mg/dL — ABNORMAL HIGH (ref 65–99)
Glucose-Capillary: 100 mg/dL — ABNORMAL HIGH (ref 65–99)
Glucose-Capillary: 93 mg/dL (ref 65–99)
Glucose-Capillary: 132 mg/dL — ABNORMAL HIGH (ref 65–99)

## 2014-10-13 MED ORDER — FERROUS SULFATE 325 (65 FE) MG PO TABS
325.0000 mg | ORAL_TABLET | Freq: Every day | ORAL | Status: DC
  Administered 2014-10-14 – 2014-10-15 (×2): 325 mg via ORAL
  Filled 2014-10-13 (×3): qty 1

## 2014-10-13 MED ORDER — POTASSIUM CHLORIDE CRYS ER 20 MEQ PO TBCR
30.0000 meq | EXTENDED_RELEASE_TABLET | Freq: Once | ORAL | Status: AC
  Administered 2014-10-13: 30 meq via ORAL
  Filled 2014-10-13: qty 1

## 2014-10-13 MED ORDER — FOLIC ACID 1 MG PO TABS
1.0000 mg | ORAL_TABLET | Freq: Every day | ORAL | Status: DC
Start: 2014-10-13 — End: 2014-10-15
  Administered 2014-10-13 – 2014-10-15 (×3): 1 mg via ORAL
  Filled 2014-10-13 (×4): qty 1

## 2014-10-13 MED ORDER — POTASSIUM CHLORIDE CRYS ER 20 MEQ PO TBCR
40.0000 meq | EXTENDED_RELEASE_TABLET | Freq: Once | ORAL | Status: AC
  Administered 2014-10-13: 40 meq via ORAL
  Filled 2014-10-13: qty 2

## 2014-10-13 NOTE — Progress Notes (Signed)
Pt ambulated 7ft. Was to weak to walk the whole "circle". Wheeled back in WC. Hiram Comber RN

## 2014-10-13 NOTE — Progress Notes (Signed)
10/13/2014 9:25 PM Pt ambulated about 50 feet in hallway with front wheel walker with nurse.  Patient tried to go too quickly and got short of breath and turned around back to his room.  Will continue to monitor patient. Victor [name]

## 2014-10-13 NOTE — Progress Notes (Signed)
D/C pt pacer wires per MD order. Pt toloerated well v/s stable after. Educated pt that bedrest would be until 14:30. Pt understood. Will continue to monitor. Hiram Comber RN

## 2014-10-13 NOTE — Progress Notes (Addendum)
      [address]       [place] [address]             [phone]        6 Days Post-Op Procedure(s) (LRB): LEFT  FEMORAL ARTERY -RIGHT FEMORAL ARTERY BYPASS GRAFT USING X 30 CM HEMASHIELD GOLD GRAFT (N/A)  Subjective: Patient "tired" but no other complaints.  Objective: Vital signs in last 24 hours: Temp:  [97.5 F (36.4 C)-98.9 F (37.2 C)] 98.9 F (37.2 C) (06/19 0508) Pulse Rate:  [93-105] 97 (06/19 0508) Cardiac Rhythm:  [-] Normal sinus rhythm (06/19 0813) Resp:  [18-24] 18 (06/19 0508) BP: (105-115)/(50-65) 110/58 mmHg (06/19 0508) SpO2:  [93 %-98 %] 93 % (06/19 0508) Weight:  [171 lb 9.6 oz (77.837 kg)] 171 lb 9.6 oz (77.837 kg) (06/19 0508)  Pre op weight 77.1 kg Current Weight  10/13/14 171 lb 9.6 oz (77.837 kg)       Intake/Output from previous day: 06/18 0701 - 06/19 0700 In: 390 [P.O.:140; IV Piggyback:250] Out: 301 [Urine:300; Stool:1]   Physical Exam:  Cardiovascular: Slightly tachy Pulmonary: Clear to auscultation bilaterally; no rales, wheezes, or rhonchi. Abdomen: Soft, non tender, bowel sounds present. Extremities: No lower extremity edema. Wounds: Clean and dry.  No erythema or signs of infection. Left groin wound is clean and dry.  Lab Results: CBC: Recent Labs  10/11/14 0400 10/13/14 0552  WBC 14.0* 12.1*  HGB 10.4* 9.6*  HCT 30.9* 28.2*  PLT 209 262   BMET:  Recent Labs  10/11/14 0400 10/13/14 0552  NA 138 138  K 3.4* 3.3*  CL 104 107  CO2 26 22  GLUCOSE 105* 102*  BUN 20 18  CREATININE 1.23 1.23  CALCIUM 7.7* 7.9*    PT/INR:  Lab Results  Component Value Date   INR 1.59* 10/07/2014   INR 2.76* 10/06/2014   INR 1.29 10/06/2014   ABG:  INR: Will add last result for INR, ABG once components are confirmed Will add last 4 CBG results once components are confirmed  Assessment/Plan:  1. CV - Slightly tachy in low 100's and BP well controlled. On Lopressor 25 mg bid 2.  Pulmonary - On  room air. Encourage incentive spirometer 3.  Acute blood loss anemia - H and H decreased to 9.6 and 28.2. Start folic acid and oral Ferrous. 4.ID-On Vanco and Zosyn. WBC decreasing from 14 to 12,100 and remains afebrile.  5. Supplement potassium 6. Remove EPW  ZIMMERMAN,DONIELLE MPA-C 10/13/2014,9:18 AM  CXR images reviewed- sig improvement in bilat air space DZ, WBC better- cont iv antibiotics ( now 5th day)  patient examined and medical record reviewed,agree with above note. Kathlee Nations Trigt III 10/13/2014

## 2014-10-13 NOTE — Clinical Social Work Note (Signed)
Clinical Social Work Assessment  Patient Details  Name: Victor [name] MRN: [id number] Date of Birth: Dec 24, 1955  Date of referral:  10/13/14               Reason for consult:  Facility Placement, Discharge Planning                Permission sought to share information with:  Family Supports Permission granted to share information::  Yes, Verbal Permission Granted  Name::     Mom and dad at bedside  Agency::     Relationship::     Contact Information:     Housing/Transportation Living arrangements for the past 2 months:  Single Family Home Source of Information:  Patient, Parent Patient Interpreter Needed:  None Criminal Activity/Legal Involvement Pertinent to Current Situation/Hospitalization:  No - Comment as needed Significant Relationships:  Parents Lives with:  Parents Do you feel safe going back to the place where you live?  Yes Need for family participation in patient care:  Yes (Comment) (Patient's parents state they will be available to assist the patient 24/7 at home.)  Care giving concerns:  Patient reports that he is comfortable with returning home. His parents appear confident in their ability to help the patient by providing minimum assistance with mobility and 24 hr. Supervision.   Social Worker assessment / plan:  Patient and patient's parents (at bedside) have decided that the patient will return home with 24/7 supervision assistance. The patient lives with his parents, but all in room confirm that other family members are also available to assist as needed. CSW explained that if placement was pursued, it would more than likely be out of county given his lack of insurance. Patient and patient's family would like to avoid placement if at all possible. Patient and parents state that they would like for the patient to have a rolling walker at home as this would help with keeping him stable while walking. CSW will update handoff report with this info. CSW signing off as  patient does not require CSW intervention.  Employment status:    Insurance information:  Self Pay (Medicaid Pending) PT Recommendations:  Skilled Nursing Facility Information / Referral to community resources:  Skilled Nursing Facility  Patient/Family's Response to care:  Patient and patient's family appear satisfied with the care the patient has received.  Patient/Family's Understanding of and Emotional Response to Diagnosis, Current Treatment, and Prognosis:  Patient and patient's parents show good insight into reason for patient's admission and post discharge needs.  Emotional Assessment Appearance:  Appears stated age Attitude/Demeanor/Rapport:  Other (Appropriate) Affect (typically observed):  Accepting, Calm, Appropriate Orientation:  Oriented to Self, Oriented to Place, Oriented to  Time, Oriented to Situation Alcohol / Substance use:  Tobacco Use Psych involvement (Current and /or in the community):  No (Comment)  Discharge Needs  Concerns to be addressed:  Discharge Planning Concerns Readmission within the last 30 days:  No Current discharge risk:  Physical Impairment Barriers to Discharge:  Continued Medical Work up  [employer] MSW, [place], [place], [phone]

## 2014-10-14 LAB — CULTURE, BLOOD (ROUTINE X 2)
Culture: NO GROWTH
Culture: NO GROWTH

## 2014-10-14 LAB — GLUCOSE, CAPILLARY
Glucose-Capillary: 96 mg/dL (ref 65–99)
Glucose-Capillary: 103 mg/dL — ABNORMAL HIGH (ref 65–99)
Glucose-Capillary: 101 mg/dL — ABNORMAL HIGH (ref 65–99)
Glucose-Capillary: 100 mg/dL — ABNORMAL HIGH (ref 65–99)

## 2014-10-14 LAB — CLOSTRIDIUM DIFFICILE BY PCR: Toxigenic C. Difficile by PCR: NEGATIVE

## 2014-10-14 NOTE — Progress Notes (Signed)
Physical Therapy Treatment Patient Details Name: Victor [name] MRN: [id number] DOB: 11/08/1955 Today's Date: 10/14/2014    History of Present Illness 59 y.o. male s/p THORACIC ASCENDING ANEURYSM REPAIR , and LEFT FEMORAL ARTERY -RIGHT FEMORAL ARTERY BYPASS GRAFT.     PT Comments    Pt making progress towards physical therapy goals. Pt appears anxious throughout session. Pt was educated in HEP and in the recommendation for SNF at d/c. Therapist described the differences and benefits in both SNF and HHPT. At this time pt agreeable to SNF and asking for CSW to call his mother to discuss the options.   Follow Up Recommendations  SNF;Supervision/Assistance - 24 hour     Equipment Recommendations  Rolling walker with 5" wheels    Recommendations for Other Services OT consult     Precautions / Restrictions Precautions Precautions: Sternal Precaution Comments: monitor sats Restrictions Weight Bearing Restrictions: Yes Other Position/Activity Restrictions: sternal precautions    Mobility  Bed Mobility               General bed mobility comments: Pt sitting up in recliner upon PT arrival.   Transfers Overall transfer level: Needs assistance Equipment used: 4-wheeled walker (pushing wheelchair) Transfers: Sit to/from Stand Sit to Stand: Min guard         General transfer comment: Hands-on guarding for safety as pt powered-up to full standing position. VC's for maintaining sternal precautions by holding heart pillow  Ambulation/Gait Ambulation/Gait assistance: Min guard Ambulation Distance (Feet): 150 Feet Assistive device: 4-wheeled walker (pushing wheelchair) Gait Pattern/deviations: Step-through pattern;Decreased stride length;Trunk flexed Gait velocity: Decreased Gait velocity interpretation: Below normal speed for age/gender General Gait Details: Pt ambulated around the nurse's station with the rollator. Pt appeared anxious and states that he will not be able to  make it all the way around. Did not accept offers for seated rest breaks with the rollator. At end of gait training pt suddenly moving very quickly to get back tot he recliner chair and actually appeared more steady than when going slow.    Stairs            Wheelchair Mobility    Modified Rankin (Stroke Patients Only)       Balance Overall balance assessment: Needs assistance Sitting-balance support: Feet supported;No upper extremity supported Sitting balance-Leahy Scale: Fair     Standing balance support: No upper extremity supported Standing balance-Leahy Scale: Fair                      Cognition Arousal/Alertness: Awake/alert Behavior During Therapy: WFL for tasks assessed/performed Overall Cognitive Status: Within Functional Limits for tasks assessed                      Exercises General Exercises - Lower Extremity Ankle Circles/Pumps: 10 reps Quad Sets: 10 reps Long Arc Quad: 10 reps Hip ABduction/ADduction: 10 reps Hip Flexion/Marching: 10 reps    General Comments        Pertinent Vitals/Pain Pain Assessment: Faces Faces Pain Scale: Hurts little more Pain Location:  (Abdomen) Pain Descriptors / Indicators: Grimacing Pain Intervention(s): Limited activity within patient's tolerance;Monitored during session;Repositioned;Premedicated before session    Home Living                      Prior Function            PT Goals (current goals can now be found in the care plan section) Acute Rehab PT Goals  Patient Stated Goal: Go home PT Goal Formulation: With patient Time For Goal Achievement: 10/23/14 Potential to Achieve Goals: Good Progress towards PT goals: Progressing toward goals    Frequency  Min 3X/week    PT Plan Discharge plan needs to be updated    Co-evaluation             End of Session Equipment Utilized During Treatment: Gait belt;Oxygen Activity Tolerance: Patient limited by fatigue Patient left: in  chair;with call bell/phone within reach     Time: 1339-1404 PT Time Calculation (min) (ACUTE ONLY): 25 min  Charges:  $Gait Training: 8-22 mins $Therapeutic Exercise: 8-22 mins                    G Codes:      Conni Slipper 2014-10-27, 3:01 PM

## 2014-10-14 NOTE — Progress Notes (Signed)
Utilization review completed.  

## 2014-10-14 NOTE — Progress Notes (Signed)
RNCM contacted CSW to inform that pt was agreeable to SNF during PT evaluation today.   Pt does not have insurance and would be unable to afford private pay at a facility ($225-300 day not including PT).  CSW contacted CSW supervisor to discuss possibility of LOG bed.  Supervisor reviewing pt and denied LOG due to patient needing minimum assistance and able to walk 155ft at this time.  CSW informed RNCM that SNF is not an option at this time.  CSW signing off.  Merlyn Lot, LCSWA Clinical Social Worker [phone]

## 2014-10-14 NOTE — Progress Notes (Signed)
[facility]  Subjective  - Doing better and he thinks he may go home today.  Continues to have numbness on/off in the right foot.   Objective 115/60 98 98.9 F (37.2 C) (Oral) 18 98%  Intake/Output Summary (Last 24 hours) at 10/14/14 0715 Last data filed at 10/14/14 0425  Gross per 24 hour  Intake    120 ml  Output    825 ml  Net   -705 ml    Palpable 2+ DP Groins soft, incisions healing well  Assessment/Planning: POD # 7 left femorofemoral bypass  F/U in our office with Dr. Arbie Cookey 2 weeks after discharge  Clinton Gallant [facility] 10/14/2014 7:15 AM --  Laboratory Lab Results:  Recent Labs  10/13/14 0552  WBC 12.1*  HGB 9.6*  HCT 28.2*  PLT 262   BMET  Recent Labs  10/13/14 0552  NA 138  K 3.3*  CL 107  CO2 22  GLUCOSE 102*  BUN 18  CREATININE 1.23  CALCIUM 7.9*    COAG Lab Results  Component Value Date   INR 1.59* 10/07/2014   INR 2.76* 10/06/2014   INR 1.29 10/06/2014   No results found for: PTT

## 2014-10-14 NOTE — Discharge Instructions (Signed)
Aortic Dissection Aortic dissection happens when there is a tear in the inner wall of the aorta. The aorta is the largest blood vessel in the body. It carries blood from the heart to the arteries, and those arteries send blood to all parts of the body. When there is a tear, the blood enters inside the aortic wall and creates a new space for the blood. This causes the aorta to split even more. The collection of blood into this new space may lower the amount of blood that reaches the rest of the body. A dissection causes the aortic wall to be weakened and can cause rupture. Death can occur. It is critical to have medical care right away. CAUSES  Aortic dissection is caused by two things:  A small tear that develops in a weak or injured part of the aortic wall.  The tear spreads inside the aortic wall and the aorta becomes "double-barreled." The following increase the risk of aortic dissection:  High blood pressure.  Hardening of the walls of arteries.  Use of cocaine.  Blunt injury to the chest.  Genetic disorders that affect the connective tissue (one example is Marfan syndrome).  Problems (such as infection) that affect either the aorta or the heart valve.  Problems that affect the tissue that holds together different parts of your body (connective tissue). For example, a few uncommon arthritis problems can increase the chance of an aortic dissection. SYMPTOMS   Sudden onset of severe chest pain. The pain may be sharp, stabbing, or ripping in nature.  The pain may then shift to the shoulder, arm, neck, jaw, abdomen, or hips. Other symptoms may include:  Breathing trouble.  Weakness of any part of the body.  Decreased feeling of any part of the body.  Fainting.  Dizziness.  Feeling sick to your stomach (nausea) and vomiting.  Sweating a lot.  Confusion/dazed. DIAGNOSIS  Testing may include the following:  Electrocardiogram.  Chest x-ray.  Imaging study done after  injecting a dye to clearly view the blood vessel (aortic angiography).  Specialized x-ray of the chest is done after injecting a dye (CT scan) or an MRI.  A test where the image of the heart is studied using sound waves (Echocardiogram). TREATMENT  Treatment will vary depending on what part of the aorta has the problem. Your caregiver may admit you into an intensive care unit. Medicines that reduce the blood pressure and strong painkillers may be given right away. In many cases, heart medicines may be given to relieve the symptoms and help blood pressure.  If medicines are used first and they do not help, then surgery may be done to repair or replace the damaged portion of the aorta. In some cases, surgery is needed right away. You may have to stay in the hospital for 7-10 days. If the aortic valve is damaged due to aortic dissection, repair or replacement of the valve may be done. If the arteries of the heart are affected, bypass surgery of the heart may be done. THIS IS AN EMERGENCY. Call your local emergency services (911 in [place] right away or the closest medical facility.  Document Released: 07/20/2007 Document Revised: 01/31/2013 Document Reviewed: 07/20/2007 [facility] Patient Information 2015 [place], [place]. This information is not intended to replace advice given to you by your health care provider. Make sure you discuss any questions you have with your health care provider.    Diet: Low Salt ___y__ Low Fat ____ Diabetic _______________ Other ____heart healthy_____ Lifting Restrictions:  Do not lift more than 10 pounds for the first 4 weeks after surgery, then do not lift more than 20 pounds for the next 8 weeks. Activity: Continue walking ______3 or more____ times per day, for __increase as able_____minutes at a time. If you become short of breath or tired with walking, stop and rest. Driving: No driving for __1___ weeks. You may be a passenger in the car; please wear your  seat belt. Work: Your return to work will be discussed at the office visit with Designer, industrial/product. Sexual Activity: You may resume sexual activity when you feel ready. Do not put any excessive pressure on your chest. Other Instructions:  Continue your breathing exercises throughout the day.  Weigh yourself daily. Keep a log. If your weight increases more than 2 lbs. per day or more than 5 lbs. in a week, call the office.  Call if you have any of the following:  -Chest pain (angina); expect some incisional discomfort.  -Shortness of breath that doesnt go away with rest.  -Fever greater than 101.0  Fever or chills.  -Nausea, vomiting, diarrhea.  -Increased pain, swelling, redness, or bruising in or around your wound.  -Drainage from your wound, or if your wound opens.  Help prevent leg swelling and clots by walking each hour and by keeping your legs elevated when sitting.  Shower daily. Wash your incision gently with warm water and mild soap.  Do not put any creams, ointments, or lotions on your incisions.  Use caution when reaching, bending, lifting, or getting up from sitting, or when getting in and out of bed. Expected Symptoms After Surgery  You will experience discomfort along the incision for 3 to 4 weeks.  You may experience numbness along your chest or leg incision for 2 to 3 months.  You will feel very fatigued for about 2 to 4 weeks, or possibly longer

## 2014-10-14 NOTE — Progress Notes (Signed)
RN sts pt refused to walk this am, wanted to sleep. Will f/u late am to ambulate and educate. Ethelda Chick CES, ACSM 9:06 AM 10/14/2014

## 2014-10-14 NOTE — Progress Notes (Addendum)
      [address]       [place] [address]             [phone]      7 Days Post-Op Procedure(s) (LRB): LEFT  FEMORAL ARTERY -RIGHT FEMORAL ARTERY BYPASS GRAFT USING X 30 CM HEMASHIELD GOLD GRAFT (N/A)   Subjective:  Victor [name] states he is doing okay.  He wants to go home.  He is not able to ambulate very far, but he feels this was because he was "rushing."  + BM ( last one 6/18, no further diarrhea)  Objective: Vital signs in last 24 hours: Temp:  [98.2 F (36.8 C)-98.9 F (37.2 C)] 98.9 F (37.2 C) (06/19 2027) Pulse Rate:  [90-98] 98 (06/19 2138) Cardiac Rhythm:  [-] Normal sinus rhythm (06/19 1953) Resp:  [18] 18 (06/19 2027) BP: (114-117)/(58-86) 115/60 mmHg (06/19 2138) SpO2:  [92 %-98 %] 98 % (06/19 2027)  Intake/Output from previous day: 06/19 0701 - 06/20 0700 In: 120 [P.O.:120] Out: 825 [Urine:825]  General appearance: alert, cooperative and no distress Heart: regular rate and rhythm Lungs: clear to auscultation bilaterally Abdomen: soft, non-tender; bowel sounds normal; no masses,  no organomegaly Extremities: edema none appreciated Wound: clean and dry  Lab Results:  Recent Labs  10/13/14 0552  WBC 12.1*  HGB 9.6*  HCT 28.2*  PLT 262   BMET:  Recent Labs  10/13/14 0552  NA 138  K 3.3*  CL 107  CO2 22  GLUCOSE 102*  BUN 18  CREATININE 1.23  CALCIUM 7.9*    PT/INR: No results for input(s): LABPROT, INR in the last 72 hours. ABG    Component Value Date/Time   PHART 7.394 10/08/2014 2307   HCO3 26.5* 10/08/2014 2307   TCO2 28 10/08/2014 2307   ACIDBASEDEF 1.0 10/08/2014 2024   O2SAT 91.0 10/08/2014 2307   CBG (last 3)   Recent Labs  10/13/14 1640 10/13/14 2136 10/14/14 0653  GLUCAP 93 132* 96    Assessment/Plan: S/P Procedure(s) (LRB): LEFT  FEMORAL ARTERY -RIGHT FEMORAL ARTERY BYPASS GRAFT USING X 30 CM HEMASHIELD GOLD GRAFT (N/A)  1. CV- NSR- good rate and pressure control- continue  Lopressor 2. Pulm- no acute issues, continue IS, off oxygen 3. Renal- creatinine remains stable, no edema, no diuresis at this time 4. GI- ? Enteric precautions, no BM since 6/18- will d/c precautions 5. ID- leukocytosis resolving, on Vanc, Zosyn- will discuss po ABX with staff 6. Deconditioning- patient not ambulating very far, declines SNF placement 7. Dispo- patient stable, nursing to ambulate patient this morning to assess for possible d/c later today  LOS: 7 days    BARRETT, ERIN 10/14/2014  I have seen and examined Victor [name] and agree with the above assessment  and plan.  [name] MD Beeper [phone] Office [phone] 10/14/2014 4:57 PM

## 2014-10-14 NOTE — Progress Notes (Signed)
PULMONARY / CRITICAL CARE MEDICINE   Name: Victor [name] MRN: [id number] DOB: 1955-10-18    ADMISSION DATE:  10/06/2014 CONSULTATION DATE:  10/09/14  REFERRING MD :  Tyrone Sage   CHIEF COMPLAINT:  Respiratory failure   INITIAL PRESENTATION: 59yo male smoker with hx polysubstance abuse otherwise no known PMH, presented 6/12 with chest pain.  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  He underwent emergent repair 6/12.  Post op had R leg pain and diminished pulses and ultimately required return to OR 6/13 for fem-fem bypass r/t R leg ischemia. Extubated after both OR trips but on 6/15 developed worsening hypoxia, increased WOB with BL ASD.  Pt was placed on bipap and PCCM consulted.  STUDIES:  CTA chest/abd/pelvis 6/12>>> Type A aortic dissection with extension into the distal R common iliac artery. the right external iliac artery appears completely occluded proximally.  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  Massive left inguinal hernia containing multiple loops of small bowel. No associated incarceration or obstruction at this time.  SIGNIFICANT EVENTS: 6/12 thoracic aortic aneurysm repair, graft 6/13 left R fem-fem bypass  SUBJECTIVE:  Afebrile Denies CP, dyspnea Able to ambulate  VITAL SIGNS: Temp:  [98.2 F (36.8 C)-98.9 F (37.2 C)] 98.9 F (37.2 C) (06/19 2027) Pulse Rate:  [90-98] 94 (06/20 1046) Resp:  [18] 18 (06/19 2027) BP: (114-119)/(58-86) 119/61 mmHg (06/20 1046) SpO2:  [92 %-98 %] 98 % (06/19 2027) HEMODYNAMICS:   VENTILATOR SETTINGS:   INTAKE / OUTPUT:  Intake/Output Summary (Last 24 hours) at 10/14/14 1104 Last data filed at 10/14/14 0425  Gross per 24 hour  Intake      0 ml  Output    600 ml  Net   -600 ml    PHYSICAL EXAMINATION:  Gen: in bed, normal respiratory effort HENT: OP clear, NCAT PULM: continued crackles left, normal effort, 2L/min CV:  RRR, no mgr, no edema, midline scar healing well GI: BS+, soft, nontender Derm: no cyanosis or rash Psyche: normal mood and affect   LABS:  CBC  Recent Labs Lab 10/10/14 0400 10/11/14 0400 10/13/14 0552  WBC 14.7* 14.0* 12.1*  HGB 10.5* 10.4* 9.6*  HCT 31.6* 30.9* 28.2*  PLT 167 209 262   Coag's No results for input(s): APTT, INR in the last 168 hours. BMET  Recent Labs Lab 10/10/14 0400 10/11/14 0400 10/13/14 0552  NA 137 138 138  K 3.3* 3.4* 3.3*  CL 100* 104 107  CO2 28 26 22   BUN 22* 20 18  CREATININE 1.28* 1.23 1.23  GLUCOSE 102* 105* 102*   Electrolytes  Recent Labs Lab 10/08/14 1708  10/10/14 0400 10/11/14 0400 10/13/14 0552  CALCIUM  --   < > 7.7* 7.7* 7.9*  MG 2.2  --   --   --   --   < > = values in this interval not displayed. Sepsis Markers  Recent Labs Lab 10/09/14 1230 10/10/14 0400 10/11/14 0400  PROCALCITON 0.96 0.92 0.63   ABG  Recent Labs Lab 10/08/14 0447 10/08/14 2024 10/08/14 2307  PHART 7.399 7.399 7.394  PCO2ART 39.4 38.9 43.2  PO2ART 53.0* 47.0* 60.0*   Liver Enzymes  Recent Labs Lab 10/08/14 0445  AST 138*  ALT 69*  ALKPHOS 44  BILITOT 1.7*  ALBUMIN 3.0*   Cardiac Enzymes No results for input(s): TROPONINI, PROBNP in the last 168 hours. Glucose  Recent Labs Lab  10/12/14 2113 10/13/14 0648 10/13/14 1135 10/13/14 1640 10/13/14 2136 10/14/14 0653  GLUCAP 99 102* 100* 93 132* 96    Imaging 6/19 CXR reviewed by me > improved aeration BL   ASSESSMENT / PLAN:  PULMONARY Acute hypoxic respiratory failure due to HCAP> improved on CXR review 6/19 Tobacco abuse   P:   Continue flutter, IS Continue mucinex bid Out of bed   CARDIOVASCULAR R IJ CVL 6/12>>> Aortic dissection - type A.  S/p repair 6/12 Occluded R iliac artery - s/p fem-fem bypass 6/13 HTN - mild  P:  Per [facility]VS, CVTS PRN metoprolol  ASA   RENAL Mild bump in Cr 6/16, likely related to lasix P:   Monitor BMET and UOP Replace  electrolytes as needed   GASTROINTESTINAL L inguinal hernia  P:   Gen surgery consult after recovery from vascular surgery   INFECTIOUS HCAP  Fever 6/17/persistent leukocytosis > could be due to pneumonia (not uncommon to still be febrile 3-5 days into antibiotic course); wounds look OK P:   Sputum 6/15 > could not obtain Blood 6/15 > ng   Vanc 6/15>>> 6/19 Zosyn 6/15>>>  Can complete 2 more days of augmentin if dc planned today  Summary - HCAp - complete 7ds of ABx total, assess for O2 needs on ambulation PCCM to sign off  Cyril Mourning MD. [name]. [facility] Pager [phone] If no response call [phone]    10/14/2014, 11:04 AM

## 2014-10-14 NOTE — Progress Notes (Signed)
CARDIAC REHAB PHASE I   PRE:  Rate/Rhythm: 95 SR    BP: sitting 119/60    SaO2: 97 RA  MODE:  Ambulation: 150 ft with x1 sitting rest at 1/2 way   POST:  Rate/Rhythm: 103 ST    BP: sitting 120/70     SaO2: 97 RA  Pt willing to walk. Feels weak, hard to stand completely straight at first. C/o SOB but seemed to be controlling his pace better today. Used rollator and pt had to sit at 75 ft for 4 min. Pt stronger on second 1/2 of walk, more upright, better steps. To recliner. SaO2 91-92  Initially then quickly up to 97 RA.  Discussed sternal precautions, walking daily and CRPII. Pt interested in CRPII and will send referral to [facility]. Pt will need financial assistance. Did not get to discuss smoking cessation due to pt needing to use BR. Left resource paper. [phone]  Elissa Lovett [place] CES, ACSM 10/14/2014 11:40 AM

## 2014-10-14 NOTE — Progress Notes (Signed)
Pt refusing to ambulate with RN at this time; pt states he wants to sleep; will cont. To monitor.

## 2014-10-14 NOTE — Discharge Summary (Signed)
Physician Discharge Summary  Patient ID: SABURO TEAFF MRN: [id number] DOB/AGE: May 05, 1955 59 y.o.  Admit date: 10/06/2014 Discharge date: 10/14/2014  Admission Diagnoses: Acute type I aortic dissection, originating just at the takeoff of the right coronary ostium, extending into the right external iliac artery  Discharge Diagnoses:  Principal Problem:   Aortic dissection Active Problems:   Hospital-acquired pneumonia   Ischemic leg  Patient Active Problem List   Diagnosis Date Noted  . Hospital-acquired pneumonia 10/09/2014  . Ischemic leg 10/09/2014  . Aortic dissection 10/07/2014   History of present illness:  Patient is a 59 year old male who developed sudden onset of midsternal chest pain radiating through to the back. He had no associated dyspnea, nausea or diaphoresis. The patient does have a history of tobacco abuse but no other known cardiac risk factors. He was seen in the emergency department and CT scan revealed a type A dissection. He was admitted for further evaluation and treatment to include cardiothoracic surgical consultation.   Discharged Condition: good  Hospital Course: The patient was admitted through the emergency department, seen in cardiothoracic surgical consultation by Sheliah Plane M.D. who felt the patient would require prompt emergent surgical intervention. He was taken to the operating room where he underwent the following procedure:   DATE OF PROCEDURE: 10/06/2014 DATE OF DISCHARGE:   OPERATIVE REPORT   PREOPERATIVE DIAGNOSIS: Acute type 1 aortic dissection originating just at the takeoff of the right coronary ostium extending into the right external iliac artery.  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  SURGICAL PROCEDURE: Repair of ascending aortic dissection with replacement of  ascending aorta and resuspension of the aortic valve with cardiopulmonary bypass and hypothermic circulatory arrest.  SURGEON: Sheliah Plane, MD  FIRST ASSISTANT: Coral Ceo, P.A.  Postoperative hospital course: The patient was initially extubated without difficulty. He did develop right lower extremity ischemia with worsening symptoms requiring vascular surgical consultation and intervention to include return to the operating room for the following procedure:    OPERATIVE REPORT  DATE OF SURGERY: 10/07/2014  PATIENT: [name], 59 y.o. male MRN: [id number]  DOB: 07/26/1955  PRE-OPERATIVE DIAGNOSIS: Right leg ischemia secondary to aortic dissection iliac artery occlusion  POST-OPERATIVE DIAGNOSIS: Same  PROCEDURE: Left right femorofemoral bypass with 8 mm Hemashield graft  SURGEON: Gretta Began, M.D.  PHYSICIAN ASSISTANT: Collins PA The patient then returned to the intensive care unit in stable condition.  Postoperative hospital course continued: The patient has progressed well. He now maintains palpable lower extremity pulses. He was again extubated without difficulty. This however was short lived as he developed fairly acute respiratory failure and pulmonary/critical care medicine consultation was obtained. He was placed on BiPAP. He was also started on empiric and a bionics for possible pneumonia. Aggressive pulmonary hygiene was instituted. Cardiology consultation was also obtained to assist with medical management which she will require post discharge as well especially to maintain adequate control of hypertension. He was treated for postoperative volume overload with good response to diaphoretic. He has an expected acute blood loss anemia and values have stabilized. He has been started on oral iron and folate acid supplement. He is maintaining normal sinus rhythm with no significant postoperative cardiac dysrhythmias. His pulmonary status has shown a good and gradual  improvement. He is tolerating gradually increasing activities using postoperative protocols. Incisions are noted to be healing well without evidence of infection. His leukocytosis has  improved. Currently his status is felt to be tentatively stable for discharge in the next 24-48 hours pending ongoing reevaluation of his recovery.He will go home with family. He will complete 10 days of abx.   Consults: cardiology and vascular surgery  Significant Diagnostic Studies: angiography: CTA  Treatments: surgery: as described above  Discharge Exam: Blood pressure 119/61, pulse 94, temperature 98.9 F (37.2 C), temperature source Oral, resp. rate 18, height  (1.778 m), weight 171 lb 9.6 oz (77.837 kg), SpO2 98 %.  General appearance: alert, cooperative and no distress Heart: regular rate and rhythm Lungs: clear to auscultation bilaterally Abdomen: benign Extremities: no edema, warm and well perfused Wound: incis healing well   Disposition: Final discharge disposition not confirmed  Discharge Instructions    Amb Referral to Cardiac Rehabilitation    Complete by:  As directed   Congestive Heart Failure: If diagnosis is Heart Failure, patient MUST meet each of the CMS criteria: 1. Left Ventricular Ejection Fraction </= 35% 2. NYHA class II-IV symptoms despite being on optimal heart failure therapy for at least 6 weeks. 3. Stable = have not had a recent (<6 weeks) or planned (<6 months) major cardiovascular hospitalization or procedure  Program Details: - Physician supervised classes - 1-3 classes per week over a 12-18 week period, generally for a total of 36 sessions  Physician Certification: I certify that the above Cardiac Rehabilitation treatment is medically necessary and is medically approved by me for treatment of this patient. The patient is willing and cooperative, able to ambulate and medically stable to participate in exercise rehabilitation. The participant's progress and  Individualized Treatment Plan will be reviewed by the Medical Director, Cardiac Rehab staff and as indicated by the Referring/Ordering Physician.  Diagnosis:  Valve Replacement/Repair          Medications at time of discharge:   Medication List    STOP taking these medications        acetaminophen 500 MG tablet  Commonly known as:  TYLENOL      TAKE these medications        amoxicillin-clavulanate 500-125 MG per tablet  Commonly known as:  AUGMENTIN  Take 1 tablet (500 mg total) by mouth 3 (three) times daily.     aspirin 325 MG EC tablet  Take 1 tablet (325 mg total) by mouth daily.     ferrous sulfate 325 (65 FE) MG tablet  Take 1 tablet (325 mg total) by mouth daily with breakfast.     folic acid 1 MG tablet  Commonly known as:  FOLVITE  Take 1 tablet (1 mg total) by mouth daily.     metoprolol tartrate 25 MG tablet  Commonly known as:  LOPRESSOR  Take 1 tablet (25 mg total) by mouth 2 (two) times daily.     oxyCODONE 5 MG immediate release tablet  Commonly known as:  Oxy IR/ROXICODONE  Take 1-2 tablets (5-10 mg total) by mouth every 4 (four) hours as needed for severe pain.        Follow-up Information    Follow up with Gretta Began, MD In 3 weeks.   Specialties:  Vascular Surgery, Cardiology   Why:  sent to office   Contact information:   [address] [place] [address] [phone]       Follow up with [name], MD.   Specialty:  Cardiothoracic Surgery   Contact information:   [address] [place] [address] [place] [address] [phone]  Follow up with Chrystie Nose, MD.   Specialty:  Cardiology   Why:  cardiologist for follow-up- see in 2 weeks   Contact information:   [address] [place] [address] [place] [address] [phone]       Signed: Rowe Clack 10/14/2014, 1:45 PM

## 2014-10-15 ENCOUNTER — Telehealth: Payer: Self-pay | Admitting: Vascular Surgery

## 2014-10-15 LAB — GLUCOSE, CAPILLARY
Glucose-Capillary: 82 mg/dL (ref 65–99)
Glucose-Capillary: 90 mg/dL (ref 65–99)

## 2014-10-15 MED ORDER — AMOXICILLIN-POT CLAVULANATE 500-125 MG PO TABS: 1.0000 | ORAL_TABLET | Freq: Three times a day (TID) | ORAL | Status: DC

## 2014-10-15 MED ORDER — ASPIRIN 325 MG PO TBEC: 325.0000 mg | DELAYED_RELEASE_TABLET | Freq: Every day | ORAL | Status: DC

## 2014-10-15 MED ORDER — OXYCODONE HCL 5 MG PO TABS: 5.0000 mg | ORAL_TABLET | ORAL | Status: DC | PRN

## 2014-10-15 MED ORDER — FOLIC ACID 1 MG PO TABS: 1.0000 mg | ORAL_TABLET | Freq: Every day | ORAL | Status: DC

## 2014-10-15 MED ORDER — FERROUS SULFATE 325 (65 FE) MG PO TABS: 325.0000 mg | ORAL_TABLET | Freq: Every day | ORAL | Status: DC

## 2014-10-15 MED ORDER — METOPROLOL TARTRATE 25 MG PO TABS: 25.0000 mg | ORAL_TABLET | Freq: Two times a day (BID) | ORAL | Status: DC

## 2014-10-15 NOTE — Telephone Encounter (Signed)
Spoke with pts mother to schedule, dpm

## 2014-10-15 NOTE — Progress Notes (Signed)
Pt discharged home with mother Discharge instructions given & reviewed Education discussed  Scripts given  IV dc'd  Tele dc'd  Pt discharged via wheelchair with volunteer services All patient belongings at side.   Louie Bun S 12:05 PM

## 2014-10-15 NOTE — Progress Notes (Signed)
Pt ambulated 150 ft with rolling walker. Pt paused twice to catch breath and rest. Pt tolerated well and is resting comfortably in bed. VSS. Pt c/o stiff ankles.   Will continue to monitor.   Edgardo Roys

## 2014-10-15 NOTE — Care Management Note (Signed)
Case Management Note Note started by Avie Arenas RNCM  Patient Details  Name: Victor [name] MRN: [id number] Date of Birth: [birth year]  Subjective/Objective:         Patient on 100% NRB mask - sitting up in chair.  States plans for discharge are for him to discharge home to his parents.            Action/Plan:   Expected Discharge Date:       10/15/14           Expected Discharge Plan:  Home w Home Health Services  In-House Referral:     Discharge planning Services  CM Consult  Post Acute Care Choice:  Durable Medical Equipment Choice offered to:     DME Arranged:  Walker rolling DME Agency:  [facility]  HH Arranged:    [facility] Agency:     Status of Service:  Completed, signed off  Medicare Important Message Given:  No Date Medicare IM Given:    Medicare IM give by:    Date Additional Medicare IM Given:    Additional Medicare Important Message give by:     If discussed at Long Length of Stay Meetings, dates discussed:  10/15/14  Additional Comments:  10/15/14- pt for d/c home with parents today- order in for RW- call made to [facility] with [facility] to have RW delivered to room prior to discharge.   Donn Pierini [place], [phone] 10/15/2014, 10:09 AM

## 2014-10-15 NOTE — Progress Notes (Addendum)
CARDIAC REHAB PHASE I   PRE:  Rate/Rhythm: 92 SR    BP: sitting 110/72    SaO2: 97 RA  MODE:  Ambulation: 100 ft   POST:  Rate/Rhythm: 103 ST    BP: sitting 127/61     SaO2: 97 RA  Pt c/o significant left ankle pain with distance today. Got to 50 ft and had to turn around and walk back. Pain worse with distance, limping upon return to room. Began to resolve with rest. Sts it was 6/10, burning pain. Not tender to touch.  This is new today. Pt concerned, RN paged PA as pt is awaiting d/c. Pt actually walking better with his gait, if it weren't for his ankle pain. No education questions. [phone]  Elissa Lovett [place] CES, ACSM 10/15/2014 11:39 AM

## 2014-10-15 NOTE — Progress Notes (Signed)
PA paged, CR concerned about severe ankle pain 6/10 when ambulating, will notify prior to discharge. Darrel Hoover 11:38 AM

## 2014-10-15 NOTE — Telephone Encounter (Signed)
-----   Message from Sharee Pimple, RN sent at 10/14/2014  9:51 AM EDT ----- Regarding: Schedule   ----- Message -----    From: Lars Mage, PA-C    Sent: 10/14/2014   7:19 AM      To: Vvs Charge Pool  F/U with Dr. Arbie Cookey in 2 weeks from discharge today or tomorrow s/p fem-fem by pass

## 2014-10-15 NOTE — Progress Notes (Signed)
      [address]       [place] [address]             [phone]      8 Days Post-Op Procedure(s) (LRB): LEFT  FEMORAL ARTERY -RIGHT FEMORAL ARTERY BYPASS GRAFT USING X 30 CM HEMASHIELD [place] GRAFT (N/A) Subjective: Feels better today, ambulation conts to improve  Objective: Vital signs in last 24 hours: Temp:  [97.7 F (36.5 C)-98.9 F (37.2 C)] 98.2 F (36.8 C) (06/21 0354) Pulse Rate:  [92-100] 92 (06/21 0354) Cardiac Rhythm:  [-] Normal sinus rhythm (06/20 2044) Resp:  [18] 18 (06/21 0354) BP: (104-123)/(57-64) 112/57 mmHg (06/21 0354) SpO2:  [97 %-100 %] 97 % (06/21 0354) Weight:  [168 lb 6.4 oz (76.386 kg)] 168 lb 6.4 oz (76.386 kg) (06/21 0354)  Hemodynamic parameters for last 24 hours:    Intake/Output from previous day: 06/20 0701 - 06/21 0700 In: 610 [P.O.:360; IV Piggyback:250] Out: 700 [Urine:700] Intake/Output this shift:    General appearance: alert, cooperative and no distress Heart: regular rate and rhythm Lungs: clear to auscultation bilaterally Abdomen: benign Extremities: no edema, warm and well perfused Wound: incis healing well  Lab Results:  Recent Labs  10/13/14 0552  WBC 12.1*  HGB 9.6*  HCT 28.2*  PLT 262   BMET:  Recent Labs  10/13/14 0552  NA 138  K 3.3*  CL 107  CO2 22  GLUCOSE 102*  BUN 18  CREATININE 1.23  CALCIUM 7.9*    PT/INR: No results for input(s): LABPROT, INR in the last 72 hours. ABG    Component Value Date/Time   PHART 7.394 10/08/2014 2307   HCO3 26.5* 10/08/2014 2307   TCO2 28 10/08/2014 2307   ACIDBASEDEF 1.0 10/08/2014 2024   O2SAT 91.0 10/08/2014 2307   CBG (last 3)   Recent Labs  10/14/14 1638 10/14/14 2104 10/15/14 0625  GLUCAP 101* 100* 82    Meds Scheduled Meds: . acetaminophen  1,000 mg Oral 4 times per day  . antiseptic oral rinse  7 mL Mouth Rinse BID  . aspirin EC  325 mg Oral Daily   Or  . aspirin  324 mg Per Tube Daily  . bisacodyl  10 mg Oral  Daily   Or  . bisacodyl  10 mg Rectal Daily  . docusate sodium  200 mg Oral Daily  . enoxaparin (LOVENOX) injection  30 mg Subcutaneous Q24H  . ferrous sulfate  325 mg Oral Q breakfast  . folic acid  1 mg Oral Daily  . guaiFENesin  600 mg Oral BID  . insulin aspart  0-15 Units Subcutaneous TID WC  . metoprolol tartrate  25 mg Oral BID   Or  . metoprolol tartrate  12.5 mg Per Tube BID  . pantoprazole  40 mg Oral Daily  . piperacillin-tazobactam (ZOSYN)  IV  3.375 g Intravenous 3 times per day  . sodium chloride  3 mL Intravenous Q12H   Continuous Infusions:  PRN Meds:.levalbuterol, metoprolol, ondansetron (ZOFRAN) IV, oxyCODONE, sodium chloride, traMADol  Xrays No results found.  Assessment/Plan: S/P Procedure(s) (LRB): LEFT  FEMORAL ARTERY -RIGHT FEMORAL ARTERY BYPASS GRAFT USING X 30 CM HEMASHIELD [place] GRAFT (N/A) Plan for discharge: see discharge orders He will complete 10 days of abx. Start po augmentin Home with family  LOS: 8 days    Madell Heino E 10/15/2014

## 2014-10-15 NOTE — Progress Notes (Signed)
Chest tube sutures removed, steri strips with benzoin applied. 10:20 AM

## 2014-10-15 NOTE — Progress Notes (Signed)
[facility]  Subjective  - Doing better, feels stronger.  He states when he walk around the halls this am his ankles were burning.     Objective 112/57 92 98.2 F (36.8 C) (Oral) 18 97%  Intake/Output Summary (Last 24 hours) at 10/15/14 0729 Last data filed at 10/15/14 0355  Gross per 24 hour  Intake    610 ml  Output    700 ml  Net    -90 ml    Palpable DP pulses 2 Groins clean and dry, soft without hematoma   Assessment/Planning: 8 Days Post-Op Procedure(s) (LRB): LEFT FEMORAL ARTERY -RIGHT FEMORAL ARTERY BYPASS GRAFT USING X 30 CM HEMASHIELD GOLD GRAFT  F/U in 2 weeks after discharge  Clinton Gallant [facility] 10/15/2014 7:29 AM --  Laboratory Lab Results:  Recent Labs  10/13/14 0552  WBC 12.1*  HGB 9.6*  HCT 28.2*  PLT 262   BMET  Recent Labs  10/13/14 0552  NA 138  K 3.3*  CL 107  CO2 22  GLUCOSE 102*  BUN 18  CREATININE 1.23  CALCIUM 7.9*    COAG Lab Results  Component Value Date   INR 1.59* 10/07/2014   INR 2.76* 10/06/2014   INR 1.29 10/06/2014   No results found for: PTT

## 2014-10-23 ENCOUNTER — Encounter: Payer: Self-pay | Admitting: Vascular Surgery

## 2014-10-24 ENCOUNTER — Telehealth: Payer: Self-pay | Admitting: *Deleted

## 2014-10-24 NOTE — Telephone Encounter (Signed)
Faxed order for phase 2 cardiac rehab - no GXT required & form for Medicaid

## 2014-10-25 ENCOUNTER — Telehealth: Payer: Self-pay

## 2014-10-25 NOTE — Telephone Encounter (Signed)
SPOKE WITH PT ABOUT FAMILY HISTORY AND STATUS.

## 2014-10-29 ENCOUNTER — Other Ambulatory Visit: Payer: Self-pay | Admitting: *Deleted

## 2014-10-29 ENCOUNTER — Encounter: Payer: Self-pay | Admitting: Vascular Surgery

## 2014-10-29 ENCOUNTER — Ambulatory Visit (INDEPENDENT_AMBULATORY_CARE_PROVIDER_SITE_OTHER): Payer: Self-pay | Admitting: Vascular Surgery

## 2014-10-29 VITALS — BP 113/66 | HR 90 | Temp 98.8°F | Resp 18 | Ht 67.25 in | Wt 154.5 lb

## 2014-10-29 DIAGNOSIS — G8918 Other acute postprocedural pain: Secondary | ICD-10-CM

## 2014-10-29 DIAGNOSIS — Z9889 Other specified postprocedural states: Secondary | ICD-10-CM

## 2014-10-29 MED ORDER — OXYCODONE HCL 5 MG PO TABS: 5.0000 mg | ORAL_TABLET | ORAL | Status: DC | PRN

## 2014-10-29 NOTE — Progress Notes (Signed)
Here today for follow-up of urgent left-to-right femorofemoral bypass on 10/07/2014. He had presented with a ascending arch dissection and underwent emergent surgery on 10/06/2014 with a valve replacement. He was found to have persistent ischemia due to his dissection was taken to the operating room on 613. The dissection did not extend down into the femoral arteries. He did have a very large left inguinal hernia. The left right femorofemoral bypass was accomplished without difficulty and he had no complications related to this. He actually did quite well hospital and discharged home. He is frustrated the slow rate of return of his energy and appetite. He has had no ischemic type symptoms. He occasionally has burning in his right leg  On physical exam his groin incisions are well-healed and he has easily palpable femorofemoral bypass.  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 We'll continue his usual walking program. We will see him in 6 months for continued follow-up and ankle arm indices. I did discuss symptoms of acute recurrent ischemia with patient and his wife present illness this occur

## 2014-10-29 NOTE — Telephone Encounter (Signed)
[name] had contacted the on call md on 10/28/14 with a request for a refill for his Oxycodone.  He was given instructions, could not remember them, called back but never received a return call per him.  I followed this from the call service report. I called him to say that a new script would be available today at the front desk and he agreed.

## 2014-10-30 ENCOUNTER — Ambulatory Visit (INDEPENDENT_AMBULATORY_CARE_PROVIDER_SITE_OTHER): Payer: Self-pay | Admitting: Physician Assistant

## 2014-10-30 ENCOUNTER — Encounter: Payer: Self-pay | Admitting: Physician Assistant

## 2014-10-30 VITALS — BP 110/60 | HR 107 | Ht 67.0 in | Wt 154.6 lb

## 2014-10-30 DIAGNOSIS — R634 Abnormal weight loss: Secondary | ICD-10-CM | POA: Insufficient documentation

## 2014-10-30 DIAGNOSIS — I7100 Dissection of unspecified site of aorta: Secondary | ICD-10-CM

## 2014-10-30 NOTE — Assessment & Plan Note (Signed)
Patient lost 14 pounds since his discharge. He reports a decreased appetite. We discussed several ways to increase his calorie intake by eating smaller meals more frequently. He will try making smoothies and or drinking ensures.

## 2014-10-30 NOTE — Progress Notes (Signed)
Patient ID: Victor [name], male   DOB: Feb 03, 1956, 59 y.o.   MRN: [id number]    Date:  10/30/2014   ID:  Victor [name], DOB Dec 11, 1955, MRN [id number]  PCP:  No PCP Per Patient  Primary Cardiologist:  Hilty- New  No chief complaint on file.    History of Present Illness: Victor [name] is a 59 y.o. male with a history of tobacco abuse, polysubstance abuse and recent thoracic aortic dissection. No family history of coronary artery disease. He was admitted from June 12 of 10/14/2014 and underwent repair of the ascending aortic dissection with replacement of the ascending aorta and suspension of the aortic valve with cardiopulmonary bypass.  TEE during surgery revealed an ejection fraction of 60-65%.    He presents for posthospital follow-up.  He reports doing well and has no specific complaints other than decreased appetite.  His chest is a little sore he sneezes.  The patient currently denies nausea, vomiting, fever, chest pain, shortness of breath, orthopnea, dizziness, PND, cough, congestion, abdominal pain, hematochezia, melena, lower extremity edema, claudication.  Wt Readings from Last 3 Encounters:  10/30/14 154 lb 9.6 oz (70.126 kg)  10/29/14 154 lb 8 oz (70.081 kg)  10/15/14 168 lb 6.4 oz (76.386 kg)     Past Medical History  Diagnosis Date  . Thoracic aortic aneurysm 09/2014    Current Outpatient Prescriptions  Medication Sig Dispense Refill  . aspirin EC 325 MG EC tablet Take 1 tablet (325 mg total) by mouth daily.    . ferrous sulfate 325 (65 FE) MG tablet Take 1 tablet (325 mg total) by mouth daily with breakfast. 30 tablet 3  . folic acid (FOLVITE) 1 MG tablet Take 1 tablet (1 mg total) by mouth daily. 30 tablet 3  . metoprolol tartrate (LOPRESSOR) 25 MG tablet Take 1 tablet (25 mg total) by mouth 2 (two) times daily. 60 tablet 1  . oxyCODONE (OXY IR/ROXICODONE) 5 MG immediate release tablet Take 1-2 tablets (5-10 mg total) by mouth every 4 (four) hours as needed for  severe pain. 50 tablet 0   No current facility-administered medications for this visit.    Allergies:   No Known Allergies  Social History:  The patient  reports that he quit smoking about 3 weeks ago. His smoking use included Cigarettes and Cigars. He quit after 35 years of use. He has never used smokeless tobacco. He reports that he does not drink alcohol or use illicit drugs.   Family history:   Family History  Problem Relation Age of Onset  . Heart murmur Mother     ROS:  Please see the history of present illness.  All other systems reviewed and negative.   PHYSICAL EXAM: VS:  BP 110/60 mmHg  Pulse 107  Ht  (1.702 m)  Wt 154 lb 9.6 oz (70.126 kg)  BMI 24.21 kg/m2 Well nourished, well developed, in no acute distress HEENT: Pupils are equal round react to light accommodation extraocular movements are intact.  Neck: no JVDNo cervical lymphadenopathy. Cardiac: Regular  Rhythm, elevated without murmurs rubs or gallops. Lungs:  clear to auscultation bilaterally, no wheezing, rhonchi or rales Abd: soft, nontender, positive bowel sounds all quadrants, no hepatosplenomegaly Ext: no lower extremity edema.  2+ radial and dorsalis pedis pulses. Skin: warm and dry Neuro:  Grossly normal  EKG: Sinus tachycardia rate 107 bpm. Atrial Enlargement   ASSESSMENT AND PLAN:  Problem List Items Addressed This Visit    Weight loss  Patient lost 14 pounds since his discharge. He reports a decreased appetite. We discussed several ways to increase his calorie intake by eating smaller meals more frequently. He will try making smoothies and or drinking ensures.      Aortic dissection - Primary    Status post repair in June 2016. He is here for follow-up doing well. He saw Dr. early yesterday      Relevant Orders   EKG 12-Lead     Follow-up Dr. Rennis GoldenHilty in 3 months. Blood pressure stable well-controlled. Heart rate is mildly elevated but I wouldn't titrate his beta blocker at this  time

## 2014-10-30 NOTE — Addendum Note (Signed)
Addended by: Adria DillELDRIDGE-[name], Shelton Soler L on: 10/30/2014 04:17 PM   Modules accepted: Orders

## 2014-10-30 NOTE — Assessment & Plan Note (Signed)
Status post repair in June 2016. He is here for follow-up doing well. He saw Dr. early yesterday

## 2014-10-30 NOTE — Patient Instructions (Signed)
Medication Instructions:  Your physician recommends that you continue on your current medications as directed. Please refer to the Current Medication list given to you today.  Labwork: NONE  Testing/Procedures: NONE  Follow-Up: Your physician recommends that you schedule a follow-up appointment in: 3 months with Dr. Rennis GoldenHilty.   Any Other Special Instructions Will Be Listed Below (If Applicable).

## 2014-11-06 ENCOUNTER — Other Ambulatory Visit: Payer: Self-pay | Admitting: *Deleted

## 2014-11-06 DIAGNOSIS — G8918 Other acute postprocedural pain: Secondary | ICD-10-CM

## 2014-11-06 MED ORDER — OXYCODONE HCL 5 MG PO TABS: 5.0000 mg | ORAL_TABLET | ORAL | Status: DC | PRN

## 2014-11-06 NOTE — Telephone Encounter (Signed)
[name] has called requesting a refill for Oxycodone after repair of an aortic dissection, last refilled on 10/29/14.  I informed him that a new script would be available at the front desk today and he agreed.

## 2014-11-11 ENCOUNTER — Other Ambulatory Visit: Payer: Self-pay | Admitting: Cardiothoracic Surgery

## 2014-11-11 DIAGNOSIS — I7100 Dissection of unspecified site of aorta: Secondary | ICD-10-CM

## 2014-11-14 ENCOUNTER — Ambulatory Visit: Payer: Self-pay | Admitting: Cardiothoracic Surgery

## 2014-11-18 ENCOUNTER — Ambulatory Visit
Admission: RE | Admit: 2014-11-18 | Discharge: 2014-11-18 | Disposition: A | Discharge status: Home / Self Care | Payer: No Typology Code available for payment source | Source: Ambulatory Visit | Attending: Cardiothoracic Surgery | Admitting: Cardiothoracic Surgery

## 2014-11-18 ENCOUNTER — Ambulatory Visit (INDEPENDENT_AMBULATORY_CARE_PROVIDER_SITE_OTHER): Payer: Self-pay | Admitting: Physician Assistant

## 2014-11-18 VITALS — BP 130/80 | HR 88 | Resp 20 | Ht 67.0 in | Wt 154.0 lb

## 2014-11-18 DIAGNOSIS — I7100 Dissection of unspecified site of aorta: Secondary | ICD-10-CM

## 2014-11-18 MED ORDER — OXYCODONE HCL 5 MG PO TABS: 5.0000 mg | ORAL_TABLET | ORAL | Status: DC | PRN

## 2014-11-18 NOTE — Progress Notes (Signed)
  HPI:  Patient returns for routine postoperative follow-up having undergone Repair of Aortic Dissection on 10/06/2014.  The patient's early postoperative recovery while in the hospital was unremarkable Since hospital discharge the patient reports he is doing very well.  He has no complaints.  He asks for a refill of Oxycodone for low back pain.  He also asks when he can resume work.   Current Outpatient Prescriptions  Medication Sig Dispense Refill  . aspirin EC 325 MG EC tablet Take 1 tablet (325 mg total) by mouth daily.    . ferrous sulfate 325 (65 FE) MG tablet Take 1 tablet (325 mg total) by mouth daily with breakfast. 30 tablet 3  . folic acid (FOLVITE) 1 MG tablet Take 1 tablet (1 mg total) by mouth daily. 30 tablet 3  . metoprolol tartrate (LOPRESSOR) 25 MG tablet Take 1 tablet (25 mg total) by mouth 2 (two) times daily. 60 tablet 1  . oxyCODONE (OXY IR/ROXICODONE) 5 MG immediate release tablet Take 1-2 tablets (5-10 mg total) by mouth every 4 (four) hours as needed for severe pain. 50 tablet 0  . oxyCODONE (OXY IR/ROXICODONE) 5 MG immediate release tablet Take 1 tablet (5 mg total) by mouth every 4 (four) hours as needed for severe pain. 30 tablet 0   No current facility-administered medications for this visit.    Physical Exam:  BP 130/80 mmHg  Pulse 88  Resp 20  Ht  (1.702 m)  Wt 154 lb (69.854 kg)  BMI 24.11 kg/m2  SpO2 98%  Gen: no apparent distress Heart: RRR Lungs:CTA Bilaterally Abd: soft non-tender, non-distended Skin: incisions well healed, no LE edema  Diagnostic Tests:  CXR: post surgical changes, no pleural effusions  A/P  1. S/P Repair Aortic Dissection- doing well, blood pressure controlled 2. Low Back Pain- patient requesting refill on Oxy IR, even asking if Pharmacy downstairs can fill- I explained to patient that these pills are meant for severe pain.  Low back pain is not resultant from our surgery so we will not be giving him refills on this  medication.  He will be give a refill today and he was instructed to have a general practitioner or a pain clinic provide him with his pain medication 3. RTC in 3 months, and then at 12 months with CTA chest  Lowella Dandy, PA-C [facility] [phone]

## 2014-11-25 ENCOUNTER — Encounter: Payer: Self-pay | Admitting: Physician Assistant

## 2014-11-29 DIAGNOSIS — Z736 Limitation of activities due to disability: Secondary | ICD-10-CM

## 2015-01-28 ENCOUNTER — Encounter: Payer: Self-pay | Admitting: Internal Medicine

## 2015-01-28 ENCOUNTER — Ambulatory Visit (INDEPENDENT_AMBULATORY_CARE_PROVIDER_SITE_OTHER): Payer: Self-pay | Admitting: Internal Medicine

## 2015-01-28 VITALS — BP 120/78 | HR 96 | Ht 67.0 in | Wt 161.0 lb

## 2015-01-28 DIAGNOSIS — E538 Deficiency of other specified B group vitamins: Secondary | ICD-10-CM

## 2015-01-28 DIAGNOSIS — D509 Iron deficiency anemia, unspecified: Secondary | ICD-10-CM

## 2015-01-28 DIAGNOSIS — I998 Other disorder of circulatory system: Secondary | ICD-10-CM

## 2015-01-28 DIAGNOSIS — I71019 Dissection of thoracic aorta, unspecified: Secondary | ICD-10-CM

## 2015-01-28 DIAGNOSIS — I7101 Dissection of thoracic aorta: Secondary | ICD-10-CM

## 2015-01-28 DIAGNOSIS — Z13220 Encounter for screening for lipoid disorders: Secondary | ICD-10-CM

## 2015-01-28 DIAGNOSIS — E611 Iron deficiency: Secondary | ICD-10-CM

## 2015-01-28 DIAGNOSIS — Z79899 Other long term (current) drug therapy: Secondary | ICD-10-CM

## 2015-01-28 MED ORDER — METOPROLOL TARTRATE 25 MG PO TABS: 12.5000 mg | ORAL_TABLET | Freq: Two times a day (BID) | ORAL | Status: DC

## 2015-01-28 NOTE — Progress Notes (Signed)
OFFICE NOTE  Chief Complaint:  Hospital follow-up  Primary Care Physician: No PCP Per Patient  HPI:  Victor [name] is a 59 y.o. male with a history of tobacco abuse, polysubstance abuse and recent thoracic aortic dissection. No family history of coronary artery disease. He was admitted from June 12 of 10/14/2014 and underwent repair of the ascending aortic dissection with replacement of the ascending aorta and suspension of the aortic valve with cardiopulmonary bypass. TEE during surgery revealed an ejection fraction of 60-65%.He was recently seen by Wilburt Finlay, PA-C-C and follow-up. He was noted to not be taking his beta blocker at the time. He says he may never have had this prescription filled. Otherwise he is asymptomatic. He denies any chest pain or shortness of breath. He was on iron in the hospital for anemia and is questioning whether he needs to be on this today or not. He reports he has stopped smoking which I've encouraged him to maintain.  PMHx:  Past Medical History  Diagnosis Date  . Thoracic aortic aneurysm (HCC) 09/2014    Past Surgical History  Procedure Laterality Date  . Thoracic aortic aneurysm repair N/A 10/06/2014    Procedure: THORACIC ASCENDING ANEURYSM REPAIR (AAA);  Surgeon: [name], MD;  Location: [facility] OR;  Service: Open Heart Surgery;  Laterality: N/A;  hyportermia circulatory arrest and resuspension of aortic valve  . Femoral-femoral bypass graft N/A 10/07/2014    Procedure: LEFT  FEMORAL ARTERY -RIGHT FEMORAL ARTERY BYPASS GRAFT USING X 30 CM HEMASHIELD GOLD GRAFT;  Surgeon: Larina Earthly, MD;  Location: [facility] OR;  Service: Vascular;  Laterality: N/A;    FAMHx:  Family History  Problem Relation Age of Onset  . Heart murmur Mother     SOCHx:   reports that he quit smoking about 3 months ago. His smoking use included Cigarettes and Cigars. He quit after 35 years of use. He has never used smokeless tobacco. He reports that he does not drink  alcohol or use illicit drugs.  ALLERGIES:  No Known Allergies  ROS: A comprehensive review of systems was negative.  HOME MEDS: Current Outpatient Prescriptions  Medication Sig Dispense Refill  . aspirin EC 325 MG EC tablet Take 1 tablet (325 mg total) by mouth daily.    . ferrous sulfate 325 (65 FE) MG tablet Take 1 tablet (325 mg total) by mouth daily with breakfast. 30 tablet 3  . folic acid (FOLVITE) 1 MG tablet Take 1 tablet (1 mg total) by mouth daily. 30 tablet 3  . metoprolol tartrate (LOPRESSOR) 25 MG tablet Take 0.5 tablets (12.5 mg total) by mouth 2 (two) times daily. 180 tablet 3   No current facility-administered medications for this visit.    LABS/IMAGING: No results found for this or any previous visit (from the past 48 hour(s)). No results found.  WEIGHTS: Wt Readings from Last 3 Encounters:  01/28/15 161 lb (73.029 kg)  11/18/14 154 lb (69.854 kg)  10/30/14 154 lb 9.6 oz (70.126 kg)    VITALS: BP 120/78 mmHg  Pulse 96  Ht  (1.702 m)  Wt 161 lb (73.029 kg)  BMI 25.21 kg/m2  EXAM: General appearance: alert and no distress Neck: no carotid bruit and no JVD Lungs: clear to auscultation bilaterally Heart: regular rate and rhythm, S1, S2 normal, no murmur, click, rub or gallop Abdomen: soft, non-tender; bowel sounds normal; no masses,  no organomegaly Extremities: extremities normal, atraumatic, no cyanosis or edema Pulses: 2+ and  symmetric Skin: Skin color, texture, turgor normal. No rashes or lesions Neurologic: Grossly normal Psych: Pleasant  EKG: Normal sinus rhythm at 96, biatrial enlargement  ASSESSMENT: 1. Thoracic aortic dissection 2. Ischemic leg 3. Iron deficiency anemia  PLAN: 1.   Mr. Cutler had thoracic aortic dissection which was successfully operated on. He was seen in consultation by me for hypoxic respiratory failure but there was no clear cardiovascular cause for this. He was supposed to be on beta blocker but never started  the medication. I do think he benefit from beta blockade and would recommend starting low-dose metoprolol tartrate 12.5 mg daily. This may be better tolerated than the higher dose. With regards to his anemia, I will go ahead and recheck iron studies and a CBC today as well as a metabolic profile and lipid profile. He most likely would benefit from being on cholesterol medication if he were to be compliant with it. He does need to establish with a primary care provider. Plan to see him back on an as-needed basis.  Chrystie Nose, MD, [facility] Attending Cardiologist [facility]  Chrystie Nose 01/28/2015, 12:19 PM

## 2015-01-28 NOTE — Patient Instructions (Addendum)
Med Instructions >> RESTART metoprolol tartrate 12.5mg  twice daily --- take one-half of a  tablet twice daily  Your physician recommends that you return for lab work FASTING  Please obtain a primary care provider.   Dr. Rennis Golden said you can follow up with him as needed.

## 2015-02-04 LAB — FERRITIN: FERRITIN: 63 ng/mL (ref 22–322)

## 2015-02-04 LAB — TRANSFERRIN: TRANSFERRIN: 234 mg/dL (ref 188–341)

## 2015-02-04 LAB — LIPID PANEL
Cholesterol: 125 mg/dL (ref 125–200)
HDL: 50 mg/dL (ref >=40)
LDL Cholesterol: 63 mg/dL (ref <130)
Total CHOL/HDL Ratio: 2.5 Ratio (ref <=5.0)
Triglycerides: 60 mg/dL (ref <150)
VLDL: 12 mg/dL (ref <30)

## 2015-02-04 LAB — COMPREHENSIVE METABOLIC PANEL
ALK PHOS: 76 U/L (ref 40–115)
ALT: 10 U/L (ref 9–46)
AST: 12 U/L (ref 10–35)
Albumin: 4.1 g/dL (ref 3.6–5.1)
BILIRUBIN TOTAL: 1.1 mg/dL (ref 0.2–1.2)
BUN: 14 mg/dL (ref 7–25)
CALCIUM: 9.1 mg/dL (ref 8.6–10.3)
CO2: 27 mmol/L (ref 20–31)
Chloride: 104 mmol/L (ref 98–110)
Creat: 1.14 mg/dL (ref 0.70–1.33)
Glucose, Bld: 85 mg/dL (ref 65–99)
Potassium: 4.5 mmol/L (ref 3.5–5.3)
Sodium: 140 mmol/L (ref 135–146)
Total Protein: 6.7 g/dL (ref 6.1–8.1)

## 2015-02-04 LAB — CBC
HCT: 40.4 % (ref 39.0–52.0)
HEMOGLOBIN: 13.3 g/dL (ref 13.0–17.0)
MCH: 25.7 pg — ABNORMAL LOW (ref 26.0–34.0)
MCHC: 32.9 g/dL (ref 30.0–36.0)
MCV: 78.1 fL (ref 78.0–100.0)
MPV: 9.5 fL (ref 8.6–12.4)
Platelets: 295 10*3/uL (ref 150–400)
RBC: 5.17 MIL/uL (ref 4.22–5.81)
RDW: 18.7 % — AB (ref 11.5–15.5)
WBC: 5.7 10*3/uL (ref 4.0–10.5)

## 2015-02-04 LAB — FOLATE

## 2015-02-04 LAB — IRON AND TIBC
%SAT: 23 % (ref 15–60)
Iron: 68 ug/dL (ref 50–180)
TIBC: 296 ug/dL (ref 250–425)
UIBC: 228 ug/dL (ref 125–400)

## 2015-02-20 ENCOUNTER — Encounter: Payer: Self-pay | Admitting: Family

## 2015-02-20 ENCOUNTER — Other Ambulatory Visit: Payer: Self-pay | Admitting: *Deleted

## 2015-02-20 ENCOUNTER — Encounter: Payer: Self-pay | Admitting: Cardiothoracic Surgery

## 2015-02-20 ENCOUNTER — Ambulatory Visit (INDEPENDENT_AMBULATORY_CARE_PROVIDER_SITE_OTHER): Payer: Self-pay | Admitting: Cardiothoracic Surgery

## 2015-02-20 VITALS — BP 137/88 | HR 90 | Resp 20 | Ht 67.0 in | Wt 164.0 lb

## 2015-02-20 DIAGNOSIS — I71019 Dissection of thoracic aorta, unspecified: Secondary | ICD-10-CM

## 2015-02-20 DIAGNOSIS — I7101 Dissection of thoracic aorta: Secondary | ICD-10-CM

## 2015-02-20 NOTE — Progress Notes (Signed)
[address]       [place] [address]             [phone]                    Victor [name] [facility] Medical Record #[id number] Date of Birth: 02-Apr-1956  Referring: Chrystie Nose, MD Primary Care: No PCP Per Patient  Chief Complaint:    Chief Complaint  Patient presents with  . Routine Post Op    3 month f/u, HX of aortic dissection repair 10/06/2014   10/06/2014 OPERATIVE REPORT PREOPERATIVE DIAGNOSIS: Acute type 1 aortic dissection originating just at the takeoff of the right coronary ostium extending into the right external iliac artery. 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 SURGICAL PROCEDURE: Repair of ascending aortic dissection with replacement of ascending aorta and resuspension of the aortic valve with cardiopulmonary bypass and hypothermic circulatory arrest. SURGEON: Sheliah Plane, MD  History of Present Illness:    Victor [name] 59 y.o. male is seen in the office  today for follow-up after repair of acute type I aortic dissection and subsequent need for femorofemoral bypass because of ischemic right leg in the early postoperative period. Since surgery the patient has stopped smoking. His major complaint today is his large inguinal hernia that he's waiting to get repaired as soon as he has health insurance. He is ambulating without difficulty, denies claudication or rest pain in his feet. He has been taking Lopressor twice a day.    Current Activity/ Functional Status:  Patient is independent with mobility/ambulation, transfers, ADL's, IADL's.   Zubrod Score: At the time of surgery this patient's most appropriate activity status/level should be described as:     0    Normal activity, no symptoms     1    Restricted in physical strenuous activity but ambulatory, able to do out light work      2    Ambulatory and capable of self care, unable to do work activities, up and about               >50 % of waking hours                                  3    Only limited self care, in bed greater than 50% of waking hours     4    Completely disabled, no self care, confined to bed or chair     5    Moribund   Past Medical History  Diagnosis Date  . Thoracic aortic aneurysm (HCC) 09/2014    Past Surgical History  Procedure Laterality Date  . Thoracic aortic aneurysm repair N/A 10/06/2014    Procedure: THORACIC ASCENDING ANEURYSM REPAIR (AAA);  Surgeon: [name], MD;  Location: [facility] OR;  Service: Open Heart Surgery;  Laterality: N/A;  hyportermia circulatory arrest and resuspension of aortic valve  . Femoral-femoral bypass graft N/A 10/07/2014    Procedure: LEFT  FEMORAL ARTERY -RIGHT FEMORAL ARTERY BYPASS GRAFT USING X 30 CM HEMASHIELD GOLD GRAFT;  Surgeon: Larina Earthly, MD;  Location: [facility] OR;  Service: Vascular;  Laterality: N/A;    Family History  Problem Relation Age of Onset  . Heart murmur Mother  Social History   Social History  . Marital Status: Single    Spouse Name: N/A  . Number of Children: N/A  . Years of Education: N/A   Occupational History  . Not on file.   Social History Main Topics  . Smoking status: Former Smoker -- 35 years    Types: Cigarettes, Cigars    Quit date: 10/07/2014  . Smokeless tobacco: Never Used  . Alcohol Use: No  . Drug Use: No  . Sexual Activity: Not on file   Other Topics Concern  . Not on file   Social History Narrative    History  Smoking status  . Former Smoker -- 35 years  . Types: Cigarettes, Cigars  . Quit date: 10/07/2014  Smokeless tobacco  . Never Used    History  Alcohol Use No     No Known Allergies  Current Outpatient Prescriptions  Medication Sig Dispense Refill  . aspirin EC 325 MG EC tablet Take 1 tablet (325 mg total) by mouth daily.    . metoprolol tartrate (LOPRESSOR) 25 MG  tablet Take 0.5 tablets (12.5 mg total) by mouth 2 (two) times daily. 180 tablet 3   No current facility-administered medications for this visit.      Review of Systems:     Cardiac Review of Systems: Y or N  Chest Pain [  n  ]  Resting SOB [  n ] Exertional SOB  [n Orthopnea [  n]   Pedal Edema [n   ]    Palpitations Milo.Brash[n  ] Syncope  [ n ]   Presyncope [ n  ]  General Review of Systems: [Y] = yes [  ]=no Constitional: recent weight change [  ];  Wt loss over the last 3 months [   ] anorexia [  ]; fatigue [  ]; nausea [  ]; night sweats [  ]; fever [  ]; or chills [  ];          Dental: poor dentition[  ]; Last Dentist visit:   Eye : blurred vision [  ]; diplopia [   ]; vision changes [  ];  Amaurosis fugax[  ]; Resp: cough [  ];  wheezing[  ];  hemoptysis[  ]; shortness of breath[  ]; paroxysmal nocturnal dyspnea[  ]; dyspnea on exertion[  ]; or orthopnea[  ];  GI:  gallstones[  ], vomiting[  ];  dysphagia[  ]; melena[  ];  hematochezia [  ]; heartburn[  ];   Hx of  Colonoscopy[  ]; GU: kidney stones [  ]; hematuria[  ];   dysuria [  ];  nocturia[  ];  history of     obstruction [  ]; urinary frequency [  ]             Skin: rash, swelling[  ];, hair loss[  ];  peripheral edema[  ];  or itching[  ]; Musculosketetal: myalgias[  ];  joint swelling[  ];  joint erythema[  ];  joint pain[  ];  back pain[  ];  Heme/Lymph: bruising[  ];  bleeding[  ];  anemia[  ];  Neuro: TIA[  ];  headaches[  ];  stroke[  ];  vertigo[  ];  seizures[  ];   paresthesias[n  ];  difficulty walking[ n ];  Psych:depression[  ]; anxiety[  ];  Endocrine: diabetes[  ];  thyroid dysfunction[  ];  Immunizations: Flu up to date [ n ];  Pneumococcal up to date [n  ];  Other:  Physical Exam: BP 137/88 mmHg  Pulse 90  Resp 20  Ht  (1.702 m)  Wt 164 lb (74.39 kg)  BMI 25.68 kg/m2  SpO2 98%  PHYSICAL EXAMINATION: General appearance: alert, cooperative and no distress Head: Normocephalic, without obvious  abnormality, atraumatic Neck: no adenopathy, no carotid bruit, no JVD, supple, symmetrical, trachea midline and thyroid not enlarged, symmetric, no tenderness/mass/nodules Lymph nodes: Cervical, supraclavicular, and axillary nodes normal. Resp: clear to auscultation bilaterally Back: symmetric, no curvature. ROM normal. No CVA tenderness. Cardio: regular rate and rhythm, S1, S2 normal, no murmur, click, rub or gallop, no click, no rub and No murmur of aortic insufficiency is appreciated GI: soft, non-tender; bowel sounds normal; no masses,  no organomegaly Extremities: extremities normal, atraumatic, no cyanosis or edema, Homans sign is negative, no sign of DVT and The patient has 1+ DP and PT pulses palpable at the right ankle 2+ DP and PT pulses palpable at the left ankle, he has palpable bilateral femoral pulses I do not appreciate any pulse in the femorofemoral crossover graft Neurologic: Grossly normal Very large left inguinal hernias present  Diagnostic Studies & Laboratory data:     Recent Radiology Findings:   No results found.   Recent Lab Findings: Lab Results  Component Value Date   WBC 5.7 02/03/2015   HGB 13.3 02/03/2015   HCT 40.4 02/03/2015   PLT 295 02/03/2015   GLUCOSE 85 02/03/2015   CHOL 125 02/03/2015   TRIG 60 02/03/2015   HDL 50 02/03/2015   LDLCALC 63 02/03/2015   ALT 10 02/03/2015   AST 12 02/03/2015   NA 140 02/03/2015   K 4.5 02/03/2015   CL 104 02/03/2015   CREATININE 1.14 02/03/2015   BUN 14 02/03/2015   CO2 27 02/03/2015   INR 1.59* 10/07/2014      Assessment / Plan:   Patient status post type I aortic dissection now approximate 5 months post repair, it appears that his femorofemoral crossover graft is probably clotted but without evidence of ischemia in his lower extremities. - We'll have the patient move up his vascular lab study which is already scheduled by Dr. early later this week or early next week.- If the femorofemoral is clotted it's  likely we do not need to do anything about it currently as his lower extremities are without symptoms of claudication or ischemia. We'll plan to follow-up CTA of the chest abdomen and pelvis approximate 6 months postop in late November or early December., Patient would like to wait until he has his insurance coverage intact.      I  spent 30 minutes counseling the patient face to face and 50% or more the  time was spent in counseling and coordination of care. The total time spent in the appointment was 40 minutes.  [name] MD      [address] [place] [place] [address] Office [phone]   Beeper [phone]  02/20/2015 12:47 PM

## 2015-02-24 ENCOUNTER — Other Ambulatory Visit: Payer: Self-pay | Admitting: Vascular Surgery

## 2015-02-24 ENCOUNTER — Ambulatory Visit (HOSPITAL_COMMUNITY)
Admission: RE | Admit: 2015-02-24 | Discharge: 2015-02-24 | Disposition: A | Discharge status: Home / Self Care | Payer: Self-pay | Source: Ambulatory Visit | Attending: Surgery | Admitting: Surgery

## 2015-02-24 DIAGNOSIS — I712 Thoracic aortic aneurysm, without rupture: Secondary | ICD-10-CM | POA: Insufficient documentation

## 2015-02-24 DIAGNOSIS — Z48812 Encounter for surgical aftercare following surgery on the circulatory system: Secondary | ICD-10-CM

## 2015-02-24 DIAGNOSIS — I739 Peripheral vascular disease, unspecified: Secondary | ICD-10-CM

## 2015-02-24 DIAGNOSIS — Z9889 Other specified postprocedural states: Secondary | ICD-10-CM

## 2015-02-25 ENCOUNTER — Encounter: Payer: Self-pay | Admitting: Family

## 2015-02-25 ENCOUNTER — Ambulatory Visit (INDEPENDENT_AMBULATORY_CARE_PROVIDER_SITE_OTHER): Payer: Self-pay | Admitting: Family

## 2015-02-25 VITALS — BP 130/78 | HR 97 | Temp 97.2°F | Resp 14 | Ht 67.0 in | Wt 164.0 lb

## 2015-02-25 DIAGNOSIS — Z9889 Other specified postprocedural states: Secondary | ICD-10-CM

## 2015-02-25 NOTE — Progress Notes (Signed)
[facility] [place] HISTORY AND PHYSICAL -PAD  History of Present Illness Victor [name] is a 59 y.o. male patient of Dr. Arbie Cookey returns for follow-up of urgent left-to-right femorofemoral bypass on 10/07/2014. 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 Tyrone Sage. He was found to have persistent ischemia due to his dissection was taken to the operating room on 10/07/14. The dissection did not extend down into the femoral arteries. He did have a very large left inguinal hernia. The left to right femorofemoral bypass was accomplished without difficulty and he had no complications related to this.   Dr. Tyrone Sage requested that pt be worked in today for evaluation. He had ABI's done yesterday. Pt remains asymptomatic since his above LE bypass. Before the bypass pt had claudication in hips and thighs, he denies non healing wounds. Pt states Dr. Tyrone Sage could not palpate his fem-fem graft pulse and would like this evaluated. This was not evaluated by duplex, ABI's were performed yesterday.  Pt denies any history of stroke or TIA.  Pt reports that he is anticipating repair of his very large scrotal hernia, he states this is not painful.    Pt Diabetic: No Pt smoker: former smoker, quit June 2016, started at age 76  Pt meds include: Statin :No, pt states his cholesterol is OK. Betablocker: Yes ASA: Yes Other anticoagulants/antiplatelets: no  Past Medical History  Diagnosis Date  . Thoracic aortic aneurysm (HCC) 09/2014    Social History Social History  Substance Use Topics  . Smoking status: Former Smoker -- 35 years    Types: Cigarettes, Cigars    Quit date: 10/07/2014  . Smokeless tobacco: Never Used  . Alcohol Use: No    Family History Family History  Problem Relation Age of Onset  . Heart murmur Mother     Past Surgical History  Procedure Laterality Date  . Thoracic  aortic aneurysm repair N/A 10/06/2014    Procedure: THORACIC ASCENDING ANEURYSM REPAIR (AAA);  Surgeon: [name], MD;  Location: [facility] OR;  Service: Open Heart Surgery;  Laterality: N/A;  hyportermia circulatory arrest and resuspension of aortic valve  . Femoral-femoral bypass graft N/A 10/07/2014    Procedure: LEFT  FEMORAL ARTERY -RIGHT FEMORAL ARTERY BYPASS GRAFT USING X 30 CM HEMASHIELD GOLD GRAFT;  Surgeon: Larina Earthly, MD;  Location: [facility] OR;  Service: Vascular;  Laterality: N/A;    No Known Allergies  Current Outpatient Prescriptions  Medication Sig Dispense Refill  . aspirin EC 325 MG EC tablet Take 1 tablet (325 mg total) by mouth daily.    . metoprolol tartrate (LOPRESSOR) 25 MG tablet Take 0.5 tablets (12.5 mg total) by mouth 2 (two) times daily. 180 tablet 3   No current facility-administered medications for this visit.    ROS: See HPI for pertinent positives and negatives.   Physical Examination  Filed Vitals:   02/25/15 0856  BP: 130/78  Pulse: 97  Temp: 97.2 F (36.2 C)  Resp: 14  Height:  (1.702 m)  Weight: 164 lb (74.39 kg)  SpO2: 97%   Body mass index is 25.68 kg/(m^2).  General: A&O x 3, WDWN. Gait: normal Eyes: PERRLA. Pulmonary: CTAB, without wheezes , rales or rhonchi. Cardiac: regular rhythm, no detected murmur.         Carotid Bruits Right Left   Negative Negative  Aorta is not palpable. Radial pulses: 2+ palpable and =  VASCULAR EXAM: Extremities without ischemic changes, without Gangrene; without open wounds. Fem-fem graft is palpable, but no pulse is palpable. No pulse is audible by Doppler unless the Doppler probe is directed below the graft.                                                                                                          LE Pulses Right Left       FEMORAL  3+ palpable  3+ palpable        POPLITEAL  not palpable   not palpable       POSTERIOR TIBIAL  not palpable   not  palpable        DORSALIS PEDIS      ANTERIOR TIBIAL 2+ palpable  3+ palpable    Abdomen: soft, NT, scrotum is very large, non tender. Skin: no rashes, no ulcers. Musculoskeletal: no muscle wasting or atrophy.  Neurologic: A&O X 3; Appropriate Affect ; SENSATION: normal; MOTOR FUNCTION:  moving all extremities equally, motor strength 5/5 throughout. Speech is fluent/normal.  CN 2-12 intact.    Non-Invasive Vascular Imaging: DATE: 02/24/15 ABI: RIGHT: 0.95 (no previous ABI's for comparison), Waveforms: bi and triphasic, TBI: 0.82;  LEFT: 0.99, Waveforms: triphasic, TBI: 0.91   ASSESSMENT: Victor [name] is a 59 y.o. male who is s/p urgent left-to-right femorofemoral bypass on 10/07/2014 and on 10/06/14 Dr. Tyrone SageGerhardt performed a thoracic aorotic aneurysm repair. Pt remains asymptomatic of claudication symptoms since the fem-fem bypass surgery. Dr. Tyrone SageGerhardt requested that pt be evaluated due to non palpable fem-fem graft pulse. ABI's performed yesterday: are normal with all triphasic waveforms, bilateral TBI's are also normal. The pt stopped smoking in June 2016 and is not diabetic. Dr. Arbie CookeyEarly spoke with and examined pt. The graft is occluded; however, pt has spontaneously re-canulated his femoral arteries, his bilateral femoral and DP pulses are 2 and 3+ palpable. This is a good finding; pt does not need to follow up with our practice unless he or Dr. Tyrone SageGerhardt have concerns re his LE arterial perfusion, or any other peripheral vascular issues.   PLAN:  Based on the patient's vascular studies and examination, pt will return to clinic as needed.  I discussed in depth with the patient the nature of atherosclerosis, and emphasized the importance of maximal medical management including strict control of blood pressure, blood glucose, and lipid levels, obtaining regular exercise, and continued cessation of smoking.  The patient is aware that without maximal medical management the underlying  atherosclerotic disease process will progress, limiting the benefit of any interventions.  The patient was given information about PAD including signs, symptoms, treatment, what symptoms should prompt the patient to seek immediate medical care, and risk reduction measures to take.  Charisse MarchSuzanne Jediah Horger, RN, MSN, FNP-C [facility] [place] Office Phone: [phone]  Clinic MD: Early  02/25/2015 9:03 AM

## 2015-04-10 ENCOUNTER — Encounter: Payer: Self-pay | Admitting: Cardiothoracic Surgery

## 2015-04-10 ENCOUNTER — Ambulatory Visit
Admission: RE | Admit: 2015-04-10 | Discharge: 2015-04-10 | Disposition: A | Discharge status: Home / Self Care | Payer: No Typology Code available for payment source | Source: Ambulatory Visit | Attending: Cardiothoracic Surgery | Admitting: Cardiothoracic Surgery

## 2015-04-10 DIAGNOSIS — I7101 Dissection of thoracic aorta: Secondary | ICD-10-CM

## 2015-04-10 DIAGNOSIS — I71019 Dissection of thoracic aorta, unspecified: Secondary | ICD-10-CM

## 2015-04-10 MED ORDER — IOPAMIDOL (ISOVUE-370) INJECTION 76%
75.0000 mL | Freq: Once | INTRAVENOUS | Status: AC | PRN
  Administered 2015-04-10: 75 mL via INTRAVENOUS

## 2015-04-11 ENCOUNTER — Encounter: Payer: Self-pay | Admitting: Cardiothoracic Surgery

## 2015-04-11 ENCOUNTER — Ambulatory Visit (INDEPENDENT_AMBULATORY_CARE_PROVIDER_SITE_OTHER): Payer: Self-pay | Admitting: Cardiothoracic Surgery

## 2015-04-11 VITALS — BP 159/89 | HR 86 | Resp 20 | Ht 67.0 in | Wt 164.0 lb

## 2015-04-11 DIAGNOSIS — I7101 Dissection of thoracic aorta: Secondary | ICD-10-CM

## 2015-04-11 DIAGNOSIS — I71019 Dissection of thoracic aorta, unspecified: Secondary | ICD-10-CM

## 2015-04-11 NOTE — Progress Notes (Signed)
[address]       [place] [address]             [phone]                    Victor [name] [facility] Medical Record #[id number] Date of Birth: 1955-08-03  Referring: Chrystie Nose, MD Primary Care: No PCP Per Patient  Chief Complaint:    Chief Complaint  Patient presents with  . Follow-up    6 week f/u with CTA Chest/ABD/Pelvis   10/06/2014 OPERATIVE REPORT PREOPERATIVE DIAGNOSIS: Acute type 1 aortic dissection originating just at the takeoff of the right coronary ostium extending into the right external iliac artery. 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 SURGICAL PROCEDURE: Repair of ascending aortic dissection with replacement of ascending aorta and resuspension of the aortic valve with cardiopulmonary bypass and hypothermic circulatory arrest. SURGEON: Sheliah Plane, MD  History of Present Illness:    Victor [name] 59 y.o. male is seen in the office  today for follow-up after repair of acute type I aortic dissection and subsequent need for femorofemoral bypass because of ischemic right leg in the early postoperative period. Since surgery the patient has stopped smoking. His major complaint today is his large inguinal hernia that he's waiting to get repaired as soon as he has health insurance. He is ambulating without difficulty, denies claudication or rest pain in his feet. He has been taking Lopressor twice a day.  He is back working as a Paediatric nurse, notes that his ability to stand all day is improved from several months ago  Current Activity/ Functional Status:  Patient is independent with mobility/ambulation, transfers, ADL's, IADL's.   Zubrod Score: At the time of surgery this patient's most appropriate activity status/level should be described as:     0    Normal activity, no symptoms     1     Restricted in physical strenuous activity but ambulatory, able to do out light work     2    Ambulatory and capable of self care, unable to do work activities, up and about               >50 % of waking hours                                  3    Only limited self care, in bed greater than 50% of waking hours     4    Completely disabled, no self care, confined to bed or chair     5    Moribund   Past Medical History  Diagnosis Date  . Thoracic aortic aneurysm (HCC) 09/2014    Past Surgical History  Procedure Laterality Date  . Thoracic aortic aneurysm repair N/A 10/06/2014    Procedure: THORACIC ASCENDING ANEURYSM REPAIR (AAA);  Surgeon: [name], MD;  Location: [facility] OR;  Service: Open Heart Surgery;  Laterality: N/A;  hyportermia circulatory arrest and resuspension of aortic valve  . Femoral-femoral bypass graft N/A 10/07/2014    Procedure: LEFT  FEMORAL ARTERY -RIGHT FEMORAL ARTERY BYPASS GRAFT USING X 30 CM HEMASHIELD GOLD GRAFT;  Surgeon: Larina Earthly, MD;  Location: [facility] OR;  Service: Vascular;  Laterality: N/A;    Family History  Problem Relation Age of Onset  . Heart murmur Mother     Social History   Social History  . Marital Status: Single    Spouse Name: N/A  . Number of Children: N/A  . Years of Education: N/A   Occupational History  . Not on file.   Social History Main Topics  . Smoking status: Former Smoker -- 35 years    Types: Cigarettes, Cigars    Quit date: 10/07/2014  . Smokeless tobacco: Never Used  . Alcohol Use: No  . Drug Use: No  . Sexual Activity: Not on file   Other Topics Concern  . Not on file   Social History Narrative    History  Smoking status  . Former Smoker -- 35 years  . Types: Cigarettes, Cigars  . Quit date: 10/07/2014  Smokeless tobacco  . Never Used    History  Alcohol Use No     No Known Allergies  Current Outpatient Prescriptions  Medication Sig Dispense Refill  . aspirin EC 325 MG EC tablet  Take 1 tablet (325 mg total) by mouth daily.    . metoprolol tartrate (LOPRESSOR) 25 MG tablet Take 0.5 tablets (12.5 mg total) by mouth 2 (two) times daily. 180 tablet 3   No current facility-administered medications for this visit.      Review of Systems:     Cardiac Review of Systems: Y or N  Chest Pain [  n  ]  Resting SOB [  n ] Exertional SOB  [n Orthopnea [  n]   Pedal Edema [n   ]    Palpitations Milo.Brash[n  ] Syncope  [ n ]   Presyncope [ n  ]  General Review of Systems: [Y] = yes [  ]=no Constitional: recent weight change [  ];  Wt loss over the last 3 months [   ] anorexia [  ]; fatigue [  ]; nausea [  ]; night sweats [  ]; fever [  ]; or chills [  ];          Dental: poor dentition[  ]; Last Dentist visit:   Eye : blurred vision [  ]; diplopia [   ]; vision changes [  ];  Amaurosis fugax[  ]; Resp: cough [  ];  wheezing[  ];  hemoptysis[  ]; shortness of breath[  ]; paroxysmal nocturnal dyspnea[  ]; dyspnea on exertion[  ]; or orthopnea[  ];  GI:  gallstones[  ], vomiting[  ];  dysphagia[  ]; melena[  ];  hematochezia [  ]; heartburn[  ];   Hx of  Colonoscopy[  ]; GU: kidney stones [  ]; hematuria[  ];   dysuria [  ];  nocturia[  ];  history of     obstruction [  ]; urinary frequency [  ]             Skin: rash, swelling[  ];, hair loss[  ];  peripheral edema[  ];  or itching[  ]; Musculosketetal: myalgias[  ];  joint swelling[  ];  joint erythema[  ];  joint pain[  ];  back pain[  ];  Heme/Lymph: bruising[  ];  bleeding[  ];  anemia[  ];  Neuro: TIA[  ];  headaches[  ];  stroke[  ];  vertigo[  ];  seizures[  ];   paresthesias[n  ];  difficulty walking[ n ];  Psych:depression[  ]; anxiety[  ];  Endocrine: diabetes[  ];  thyroid dysfunction[  ];  Immunizations: Flu up to date [ n ]; Pneumococcal up to date [n  ];  Other:  Physical Exam: BP 159/89 mmHg  Pulse 86  Resp 20  Ht 5\' 7"  (1.702 m)  Wt 164 lb (74.39 kg)  BMI 25.68 kg/m2  SpO2 98%  PHYSICAL EXAMINATION: General  appearance: alert, cooperative and no distress Head: Normocephalic, without obvious abnormality, atraumatic Neck: no adenopathy, no carotid bruit, no JVD, supple, symmetrical, trachea midline and thyroid not enlarged, symmetric, no tenderness/mass/nodules Lymph nodes: Cervical, supraclavicular, and axillary nodes normal. Resp: clear to auscultation bilaterally Back: symmetric, no curvature. ROM normal. No CVA tenderness. Cardio: regular rate and rhythm, S1, S2 normal, no murmur, click, rub or gallop, no click, no rub and No murmur of aortic insufficiency is appreciated GI: soft, non-tender; bowel sounds normal; no masses,  no organomegaly Extremities: extremities normal, atraumatic, no cyanosis or edema, Homans sign is negative, no sign of DVT and The patient has 1+ DP and PT pulses palpable at the right ankle 2+ DP and PT pulses palpable at the left ankle, he has palpable bilateral femoral pulses I do not appreciate any pulse in the femorofemoral crossover graft Neurologic: Grossly normal Very large left inguinal hernias present  Diagnostic Studies & Laboratory data:     Recent Radiology Findings:   Ct Angio Chest Aorta W/cm &/or Wo/cm  04/10/2015  CLINICAL DATA:  History of dissection abdominal aortic aneurysm. Former smoker. EXAM: CT ANGIOGRAPHY CHEST, ABDOMEN AND PELVIS TECHNIQUE: Multidetector CT imaging through the chest, abdomen and pelvis was performed using the standard protocol during bolus administration of intravenous contrast. Multiplanar reconstructed images and MIPs were obtained and reviewed to evaluate the vascular anatomy. CONTRAST:  75 cc SVC 70 COMPARISON:  CT the chest, abdomen pelvis - 10/06/2014 FINDINGS: CTA CHEST FINDINGS Vascular Findings: The patient has undergone open repair of the ascending thoracic aorta. The bypass graft is widely patent. There is a minimal amount of ill-defined stranding within the anterior mediastinum which is favored to be postoperative. No contrast  extravasation. Review of the precontrast images are negative for the presence of an intramural hematoma. There is a persistent dissection involving the cranial most aspect of the ascending thoracic aorta extending through the aortic arch and the descending thoracic aorta to the level of the mid/distal aspect of the abdominal aorta. Both the true and false lumens of the abdominal aortic dissection remain widely patent through the level of the descending thoracic aorta. There has been interval enlargement of the aortic arch and descending thoracic aorta. The aortic arch now measures approximately 48 mm in diameter (previously, 34 mm) while the distal descending thoracic aorta now measures approximately 37 mm, previously, 32 mm. No evidence of periaortic stranding. Normal heart size. No pericardial effusion. Although this examination was not tailored for the evaluation the pulmonary arteries, there are no discrete filling defects within the central pulmonary arterial tree to suggest central pulmonary embolism. ------------------------------------------------------------- Thoracic aortic measurements: Sinotubular junction 30 mm measured in greatest oblique coronal dimension. Proximal ascending aorta The bypass graft measures approximately 35 mm in greatest oblique short axis diameter as measured in greatest oblique axial dimension at the level of the main pulmonary artery. Aortic arch aorta 48 mm as measured in greatest oblique sagittal dimension, previously 34 mm, an approximately 46 mm in greatest oblique short axis axial diameter (image 36, series 4), previously, 35 mm. Proximal descending thoracic aorta 41 mm as measured in greatest oblique axial dimension at the level of  the main pulmonary artery, previously, 36 mm Distal descending thoracic aorta 37 mm as measured in greatest oblique axial dimension at the level of the diaphragmatic hiatus, previously 32 mm Review of the MIP images confirms the above findings.  ------------------------------------------------------------- Non-Vascular Findings: Minimal dependent subpleural ground-glass atelectasis, left greater than right. No discrete focal airspace opacities. No pleural effusion or pneumothorax. There is minimal subsegmental atelectasis along the right minor fissure. Mild apical predominant paraseptal and centrilobular emphysematous change, right greater than left. Punctate (approximately 0.6 cm) noncalcified ground-glass nodule within in the right middle lobe (image 39, series 5), unchanged since the 09/2010/2016 examination. Scattered prominent mediastinal lymph nodes with index right sided precarinal lymph node measuring 1.1 cm in greatest short axis diameter (image 45, series 4). Additional scattered mediastinal lymph nodes are numerous though individually not enlarged by size criteria with index precarinal lymph node measuring 0.8 cm in greatest short axis diameter, likely reactive in etiology. No hilar axillary lymphadenopathy. Post median sternotomy. No acute or aggressive osseous abnormalities. There is diffuse heterogeneity of the thyroid gland with a suspected punctate (approximately 0.9 cm) hypo attenuating nodule within the left lobe of the thyroid (image 15, series 4). Review of the MIP images confirms the above findings. --------------------------------------------------------------------------------- CTA ABDOMEN AND PELVIS FINDINGS Vascular Findings: Abdominal aorta: The thoracic aortic dissection extends through the mid/ distal aspects of the abdominal aorta. The false lumen of the thoracic aortic dissection is again noted to supply the right renal artery as well as the majority of supplied to the celiac artery. While there is expected slightly delayed enhancement of the right kidney in relation to the left, there is no evidence of end organ ischemic change. The dissection is again noted to abut but not extend into the origin of the celiac artery as well  as the SMA. Interval increase in size of the abdominal aorta now measuring 3 cm in diameter (image 147, series 4), previously, 2.3 cm, an approximately 3 cm in greatest oblique coronal short axis diameter (image 69, series 601), previously, 2.4 cm. The dissection terminates at the level just caudal to the take-off of the IMA. The distal abdominal aorta is widely patent as is the previously thrombosed right common and external iliac arteries. Celiac artery: As above, the dissection extends to abut the origin of the celiac artery though not extend into the vessel and not definitely result in hemodynamically significant stenosis. The supply to the celiac artery is primarily via the false lumen. Conventional branching pattern. SMA: Supplied via the true lumen. Conventional branching pattern. The distal tributaries the SMA are widely patent without discrete intraluminal filling defect to suggest distal embolism. Right Renal artery: Solitary; supplied by the false lumen. Widely patent without hemodynamically significant narrowing. No vessel irregularity to suggest FMD. Left Renal artery: Duplicated ; both right-sided renal arteries are supplied by the true lumen. Both co-dominant left-sided renal arteries are widely patent without hemodynamically significant narrowing. No vessel irregularity to suggest FMD. IMA: Supplied by the true lumen. Widely patent without hemodynamically significant narrowing. Pelvic vasculature: As above, there is no longer extension of the thoracic aortic dissection thrombus to involve the right common and external iliac arteries. These vessels now remain widely patent. The bilateral external and internal iliac arteries are widely patent without hemodynamically significant narrowing. There is a thrombosed fem fem bypass graft with mild aneurysmal dilatation adjacent to the anastomosis. The bilateral deep and superficial femoral arteries are widely patent throughout their imaged course. Review of  the MIP images confirms  the above findings. -------------------------------------------------------------------------------- Nonvascular Findings: Evaluation of the abdominal organs is largely limited to the arterial phase of enhancement. Normal hepatic contour. No discrete hyper enhancing hepatic lesions. Normal noncontrast appearance of the gallbladder. No radiopaque gallstones. No intra or extrahepatic biliary duct dilatation. No ascites. There is slightly delayed enhancement of the right kidney in comparison to the left secondary to the right kidney being supplied via the false lumen of the abdominal aortic dissection. No renal stones. No urinary obstruction or perinephric stranding. Normal appearance of the bilateral adrenal glands, pancreas and spleen. Large left-sided indirect inguinal hernia which is noted to again contain multiple loops of nondilated small bowel, not resulting in enteric obstruction. Moderate to large colonic stool burden. The bowel is otherwise normal in course and caliber without wall thickening. Normal appearance of the terminal ileum and appendix. No pneumoperitoneum, pneumatosis or portal venous gas. No bulky retroperitoneal, mesenteric, pelvic or inguinal lymphadenopathy. Scattered calcifications within normal sized prostate gland. Normal appearance of the urinary bladder given degree distention. No free fluid in the pelvic cul-de-sac. No acute or aggressive osseous abnormalities within the abdomen or pelvis. Incidental note is made of a nondisplaced left-sided L5 pars defects without associated anterolisthesis. Regional soft tissues appear otherwise normal. Review of the MIP images confirms the above findings. IMPRESSION: Vascular Impression of the chest: 1. Post open repair of the ascending thoracic aorta without evidence of complication. 2. Persistent findings of a type A dissection originating immediately adjacent to the distal end of the open bypass graft. 3. Interval enlargement  of the aortic arch and descending thoracic aorta (aortic arch now measures 48 mm in diameter, previously, 34 mm). No periaortic stranding. Nonvascular Impression of the chest: 1. No acute cardiopulmonary disease. 2. Moderate centrilobular and paraseptal emphysematous change. 3. Punctate (approximately 0.5 cm) nodule within the right middle lobe is unchanged since the 09/2014 examination. Given risk factors for bronchogenic carcinoma, follow-up chest CT at 6 - 12 months is recommended. This recommendation follows the consensus statement: Guidelines for Management of Small Pulmonary Nodules Detected on CT Scans: A Statement from the Fleischner Society as published in Radiology 2005;237:395-400. 4. Punctate (approximately 0.9 cm) hypoattenuating nodule with the left lobe of the thyroid. Further evaluation with dedicated thyroid ultrasound could be performed as clinically indicated. ----------------------------------------------------------------------------------- Vascular Impression of the abdomen and pelvis: 1. There is persistent extension of the thoracic aortic dissection now terminating at the level of the mid/distal abdominal aorta. The dissection is again noted to abut the origin of both the celiac and SMA though not extend into either vessel. The right renal artery as well as the majority of the celiac artery are supplied via the false lumen. 2. Interval increase in size of the abdominal aorta now measuring 3 cm in diameter, previously, 2.3 cm. 3. The dissection no longer extends to involve the right common iliac artery. Both the right common and external iliac arteries are now appear widely patent. As such, the fem-fem bypass graft is occluded. Nonvascular Impression of the abdomen and pelvis: 1. Grossly unchanged large left-sided indirect inguinal hernia which is again noted to contain multiple loops of nondilated small bowel. Electronically Signed   By: Simonne Come M.D.   On: 04/10/2015 11:51    Recent  Lab Findings: Lab Results  Component Value Date   WBC 5.7 02/03/2015   HGB 13.3 02/03/2015   HCT 40.4 02/03/2015   PLT 295 02/03/2015   GLUCOSE 85 02/03/2015   CHOL 125 02/03/2015   TRIG 60 02/03/2015  HDL 50 02/03/2015   LDLCALC 63 02/03/2015   ALT 10 02/03/2015   AST 12 02/03/2015   NA 140 02/03/2015   K 4.5 02/03/2015   CL 104 02/03/2015   CREATININE 1.14 02/03/2015   BUN 14 02/03/2015   CO2 27 02/03/2015   INR 1.59* 10/07/2014      Assessment / Plan:   Patient status post type I aortic dissection now approximate 6 months post repair,    Patient has persistent dissection flap in the distal arch and descending aorta, proximal pair appears intact on exam he has no evidence of aortic insufficiency The femorofemoral bypass is clotted but he has preserved flow to both lower extremities without symptoms We'll plan a follow-up CT of the chest for 4 months to evaluate any change in size of his aorta He's been cautioned to make sure he continues with his beta blocker for blood pressure control  [name] MD      [address] [place] [place] [address] Office [phone]   Beeper [phone]  04/11/2015 1:45 PM

## 2015-05-06 ENCOUNTER — Encounter (HOSPITAL_COMMUNITY): Payer: Self-pay

## 2015-05-06 ENCOUNTER — Ambulatory Visit: Payer: Self-pay | Admitting: Vascular Surgery

## 2015-08-15 ENCOUNTER — Other Ambulatory Visit: Payer: Self-pay | Admitting: Cardiothoracic Surgery

## 2015-08-15 DIAGNOSIS — I7100 Dissection of unspecified site of aorta: Secondary | ICD-10-CM

## 2015-08-28 ENCOUNTER — Ambulatory Visit
Admission: RE | Admit: 2015-08-28 | Discharge: 2015-08-28 | Disposition: A | Discharge status: Home / Self Care | Payer: BLUE CROSS/BLUE SHIELD | Source: Ambulatory Visit | Attending: Cardiothoracic Surgery | Admitting: Cardiothoracic Surgery

## 2015-08-28 ENCOUNTER — Encounter: Payer: Self-pay | Admitting: Cardiothoracic Surgery

## 2015-08-28 ENCOUNTER — Ambulatory Visit (INDEPENDENT_AMBULATORY_CARE_PROVIDER_SITE_OTHER): Payer: BLUE CROSS/BLUE SHIELD | Admitting: Cardiothoracic Surgery

## 2015-08-28 VITALS — BP 146/94 | HR 82 | Resp 16 | Ht 67.0 in | Wt 164.0 lb

## 2015-08-28 DIAGNOSIS — Z09 Encounter for follow-up examination after completed treatment for conditions other than malignant neoplasm: Secondary | ICD-10-CM | POA: Diagnosis not present

## 2015-08-28 DIAGNOSIS — I7101 Dissection of thoracic aorta: Secondary | ICD-10-CM

## 2015-08-28 DIAGNOSIS — I7100 Dissection of unspecified site of aorta: Secondary | ICD-10-CM

## 2015-08-28 DIAGNOSIS — I71019 Dissection of thoracic aorta, unspecified: Secondary | ICD-10-CM

## 2015-08-28 MED ORDER — IOPAMIDOL (ISOVUE-370) INJECTION 76%
75.0000 mL | Freq: Once | INTRAVENOUS | Status: AC | PRN
  Administered 2015-08-28: 75 mL via INTRAVENOUS

## 2015-08-28 MED ORDER — METOPROLOL TARTRATE 25 MG PO TABS: 25.0000 mg | ORAL_TABLET | Freq: Two times a day (BID) | ORAL | Status: DC

## 2015-08-28 NOTE — Patient Instructions (Signed)
Increase  Lopressor  to 25 mg two times a day (whole pill twice a day rather then 1/2 pill twice a day)

## 2015-08-28 NOTE — Progress Notes (Signed)
[address]       [place] [address]             [phone]                    [name] [facility] Medical Record #[id number] Date of Birth: 30-Jul-1955  Referring: Chrystie Nose, MD Primary Care: No PCP Per Patient  Chief Complaint:    Chief Complaint  Patient presents with  . Routine Post Op    4 month f/u with CTA CHEST s/p  AORTIC DISSECTION REPAIR   10/06/2014 OPERATIVE REPORT PREOPERATIVE DIAGNOSIS: Acute type 1 aortic dissection originating just at the takeoff of the right coronary ostium extending into the right external iliac artery. 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 SURGICAL PROCEDURE: Repair of ascending aortic dissection with replacement of ascending aorta and resuspension of the aortic valve with cardiopulmonary bypass and hypothermic circulatory arrest. SURGEON: Sheliah Plane, MD  History of Present Illness:    Victor [name] 60 y.o. male is seen in the office  today for follow-up after repair of acute type I aortic dissection and subsequent need for femorofemoral bypass because of ischemic right leg in the early postoperative period. Since surgery the patient has stopped smoking. His major complaint today is his large inguinal hernia. He just got health insurance  . He is ambulating without difficulty, denies claudication or rest pain in his feet. He has been taking Lopressor twice a day. 12.5 bid   He is back working as a Paediatric nurse, notes that his ability to stand all day is improved from several months ago  Current Activity/ Functional Status:  Patient is independent with mobility/ambulation, transfers, ADL's, IADL's.   Zubrod Score: At the time of surgery this patient's most appropriate activity status/level should be described as: [x]     0    Normal activity, no symptoms []     1     Restricted in physical strenuous activity but ambulatory, able to do out light work []     2    Ambulatory and capable of self care, unable to do work activities, up and about               >50 % of waking hours                              []     3    Only limited self care, in bed greater than 50% of waking hours []     4    Completely disabled, no self care, confined to bed or chair []     5    Moribund   Past Medical History  Diagnosis Date  . Thoracic aortic aneurysm (HCC) 09/2014    Past Surgical History  Procedure Laterality Date  . Thoracic aortic aneurysm repair N/A 10/06/2014    Procedure: THORACIC ASCENDING ANEURYSM REPAIR (AAA);  Surgeon: [name], MD;  Location: [facility] OR;  Service: Open Heart Surgery;  Laterality: N/A;  hyportermia circulatory arrest and resuspension of aortic valve  . Femoral-femoral bypass graft N/A 10/07/2014    Procedure: LEFT  FEMORAL ARTERY -RIGHT FEMORAL ARTERY BYPASS GRAFT USING X 30 CM HEMASHIELD GOLD GRAFT;  Surgeon: Larina Earthly, MD;  Location: [facility] OR;  Service: Vascular;  Laterality: N/A;  Family History  Problem Relation Age of Onset  . Heart murmur Mother     Social History   Social History  . Marital Status: Single    Spouse Name: N/A  . Number of Children: N/A  . Years of Education: N/A   Occupational History  . Not on file.   Social History Main Topics  . Smoking status: Former Smoker -- 35 years    Types: Cigarettes, Cigars    Quit date: 10/07/2014  . Smokeless tobacco: Never Used  . Alcohol Use: No  . Drug Use: No  . Sexual Activity: Not on file   Other Topics Concern  . Not on file   Social History Narrative    History  Smoking status  . Former Smoker -- 35 years  . Types: Cigarettes, Cigars  . Quit date: 10/07/2014  Smokeless tobacco  . Never Used    History  Alcohol Use No     No Known Allergies  Current Outpatient Prescriptions  Medication Sig Dispense Refill  . aspirin EC 325 MG EC tablet  Take 1 tablet (325 mg total) by mouth daily.    . metoprolol tartrate (LOPRESSOR) 25 MG tablet Take 0.5 tablets (12.5 mg total) by mouth 2 (two) times daily. 180 tablet 3   No current facility-administered medications for this visit.      Review of Systems:     Cardiac Review of Systems: Y or N  Chest Pain [  n  ]  Resting SOB [  n ] Exertional SOB  [n Orthopnea [  n]   Pedal Edema [n   ]    Palpitations Milo.Brash  ] Syncope  [ n ]   Presyncope [ n  ]  General Review of Systems: [Y] = yes [  ]=no Constitional: recent weight change [  ];  Wt loss over the last 3 months [   ] anorexia [  ]; fatigue [  ]; nausea [  ]; night sweats [  ]; fever [  ]; or chills [  ];          Dental: poor dentition[  ]; Last Dentist visit:   Eye : blurred vision [  ]; diplopia [   ]; vision changes [  ];  Amaurosis fugax[  ]; Resp: cough [  ];  wheezing[  ];  hemoptysis[  ]; shortness of breath[  ]; paroxysmal nocturnal dyspnea[  ]; dyspnea on exertion[  ]; or orthopnea[  ];  GI:  gallstones[  ], vomiting[  ];  dysphagia[  ]; melena[  ];  hematochezia [  ]; heartburn[  ];   Hx of  Colonoscopy[  ]; GU: kidney stones [  ]; hematuria[  ];   dysuria [  ];  nocturia[  ];  history of     obstruction [  ]; urinary frequency [  ]             Skin: rash, swelling[  ];, hair loss[  ];  peripheral edema[  ];  or itching[  ]; Musculosketetal: myalgias[  ];  joint swelling[  ];  joint erythema[  ];  joint pain[  ];  back pain[  ];  Heme/Lymph: bruising[  ];  bleeding[  ];  anemia[  ];  Neuro: TIA[  ];  headaches[  ];  stroke[  ];  vertigo[  ];  seizures[  ];   paresthesias[n  ];  difficulty walking[ n ];  Psych:depression[  ]; anxiety[  ];  Endocrine:  diabetes[  ];  thyroid dysfunction[  ];  Immunizations: Flu up to date [ n ]; Pneumococcal up to date [n  ];  Other:  Physical Exam: BP 146/94 mmHg  Pulse 82  Resp 16  Ht  (1.702 m)  Wt 164 lb (74.39 kg)  BMI 25.68 kg/m2  SpO2 98%  PHYSICAL EXAMINATION: General  appearance: alert, cooperative and no distress Head: Normocephalic, without obvious abnormality, atraumatic Neck: no adenopathy, no carotid bruit, no JVD, supple, symmetrical, trachea midline and thyroid not enlarged, symmetric, no tenderness/mass/nodules Lymph nodes: Cervical, supraclavicular, and axillary nodes normal. Resp: clear to auscultation bilaterally Back: symmetric, no curvature. ROM normal. No CVA tenderness. Cardio: regular rate and rhythm, S1, S2 normal, no murmur, click, rub or gallop, no click, no rub and No murmur of aortic insufficiency is appreciated GI: soft, non-tender; bowel sounds normal; no masses,  no organomegaly Extremities: extremities normal, atraumatic, no cyanosis or edema, Homans sign is negative, no sign of DVT and The patient has 1+ DP and PT pulses palpable at the right ankle 2+ DP and PT pulses palpable at the left ankle, he has palpable bilateral femoral pulses I do not appreciate any pulse in the femorofemoral crossover graft Neurologic: Grossly normal Very large left inguinal hernias  Still present present  Diagnostic Studies & Laboratory data:     Recent Radiology Findings:  Ct Angio Chest Aorta W/cm &/or Wo/cm  08/28/2015  CLINICAL DATA:  Followup thoracic aneurysm repair EXAM: CT ANGIOGRAPHY CHEST WITH CONTRAST TECHNIQUE: Multidetector CT imaging of the chest was performed using the standard protocol during bolus administration of intravenous contrast. Multiplanar CT image reconstructions and MIPs were obtained to evaluate the vascular anatomy. CONTRAST:  75 mL Isovue 370 COMPARISON:  04/10/2015 FINDINGS: Lungs are well aerated bilaterally. Diffuse emphysematous changes are seen. A 6 mm nodule is again noted in the right middle lobe best seen on image number 80 of series 5. Thoracic inlet again demonstrates the thyroid to be somewhat enlarged in size. Previously seen left thyroid nodule is again noted and stable. There again noted changes consistent with  ascending aortic repair. Persistent dissection flap is noted in the mid thoracic arch extending into the descending thoracic aorta. This is stable in appearance from the prior exam. The false lumen occupies the majority of the descending thoracic aorta. The brachiocephalic vessels are supplied by the true lumen. As is the celiac axis and superior mesenteric artery. The overall appearance of the aorta is stable from the prior exam. No new focal abnormality is seen. The pulmonary artery as visualized is within normal limits. No hilar or mediastinal adenopathy is noted. The visualized upper abdomen demonstrates decreased perfusion of the right kidney similar to that seen on the prior exam likely related to supplied from the false lumen. The osseous structures show no acute abnormality. Review of the MIP images confirms the above findings. IMPRESSION: Emphysematous changes. Stable 6 mm nodule in the right middle lobe. Stable changes of ascending aortic repair. There is a persistent dissection flap extending from the aortic arch to the descending aorta. This is stable from the prior exam. No new focal abnormality is noted. Electronically Signed   By: Alcide Clever M.D.   On: 08/28/2015 08:51    Ct Angio Chest Aorta W/cm &/or Wo/cm  04/10/2015  CLINICAL DATA:  History of dissection abdominal aortic aneurysm. Former smoker. EXAM: CT ANGIOGRAPHY CHEST, ABDOMEN AND PELVIS TECHNIQUE: Multidetector CT imaging through the chest, abdomen and pelvis was performed using the standard  protocol during bolus administration of intravenous contrast. Multiplanar reconstructed images and MIPs were obtained and reviewed to evaluate the vascular anatomy. CONTRAST:  75 cc SVC 70 COMPARISON:  CT the chest, abdomen pelvis - 10/06/2014 FINDINGS: CTA CHEST FINDINGS Vascular Findings: The patient has undergone open repair of the ascending thoracic aorta. The bypass graft is widely patent. There is a minimal amount of ill-defined stranding  within the anterior mediastinum which is favored to be postoperative. No contrast extravasation. Review of the precontrast images are negative for the presence of an intramural hematoma. There is a persistent dissection involving the cranial most aspect of the ascending thoracic aorta extending through the aortic arch and the descending thoracic aorta to the level of the mid/distal aspect of the abdominal aorta. Both the true and false lumens of the abdominal aortic dissection remain widely patent through the level of the descending thoracic aorta. There has been interval enlargement of the aortic arch and descending thoracic aorta. The aortic arch now measures approximately 48 mm in diameter (previously, 34 mm) while the distal descending thoracic aorta now measures approximately 37 mm, previously, 32 mm. No evidence of periaortic stranding. Normal heart size. No pericardial effusion. Although this examination was not tailored for the evaluation the pulmonary arteries, there are no discrete filling defects within the central pulmonary arterial tree to suggest central pulmonary embolism. ------------------------------------------------------------- Thoracic aortic measurements: Sinotubular junction 30 mm measured in greatest oblique coronal dimension. Proximal ascending aorta The bypass graft measures approximately 35 mm in greatest oblique short axis diameter as measured in greatest oblique axial dimension at the level of the main pulmonary artery. Aortic arch aorta 48 mm as measured in greatest oblique sagittal dimension, previously 34 mm, an approximately 46 mm in greatest oblique short axis axial diameter (image 36, series 4), previously, 35 mm. Proximal descending thoracic aorta 41 mm as measured in greatest oblique axial dimension at the level of the main pulmonary artery, previously, 36 mm Distal descending thoracic aorta 37 mm as measured in greatest oblique axial dimension at the level of the diaphragmatic  hiatus, previously 32 mm Review of the MIP images confirms the above findings. ------------------------------------------------------------- Non-Vascular Findings: Minimal dependent subpleural ground-glass atelectasis, left greater than right. No discrete focal airspace opacities. No pleural effusion or pneumothorax. There is minimal subsegmental atelectasis along the right minor fissure. Mild apical predominant paraseptal and centrilobular emphysematous change, right greater than left. Punctate (approximately 0.6 cm) noncalcified ground-glass nodule within in the right middle lobe (image 39, series 5), unchanged since the 09/2010/2016 examination. Scattered prominent mediastinal lymph nodes with index right sided precarinal lymph node measuring 1.1 cm in greatest short axis diameter (image 45, series 4). Additional scattered mediastinal lymph nodes are numerous though individually not enlarged by size criteria with index precarinal lymph node measuring 0.8 cm in greatest short axis diameter, likely reactive in etiology. No hilar axillary lymphadenopathy. Post median sternotomy. No acute or aggressive osseous abnormalities. There is diffuse heterogeneity of the thyroid gland with a suspected punctate (approximately 0.9 cm) hypo attenuating nodule within the left lobe of the thyroid (image 15, series 4). Review of the MIP images confirms the above findings. --------------------------------------------------------------------------------- CTA ABDOMEN AND PELVIS FINDINGS Vascular Findings: Abdominal aorta: The thoracic aortic dissection extends through the mid/ distal aspects of the abdominal aorta. The false lumen of the thoracic aortic dissection is again noted to supply the right renal artery as well as the majority of supplied to the celiac artery. While there is expected slightly delayed enhancement of  the right kidney in relation to the left, there is no evidence of end organ ischemic change. The dissection is  again noted to abut but not extend into the origin of the celiac artery as well as the SMA. Interval increase in size of the abdominal aorta now measuring 3 cm in diameter (image 147, series 4), previously, 2.3 cm, an approximately 3 cm in greatest oblique coronal short axis diameter (image 69, series 601), previously, 2.4 cm. The dissection terminates at the level just caudal to the take-off of the IMA. The distal abdominal aorta is widely patent as is the previously thrombosed right common and external iliac arteries. Celiac artery: As above, the dissection extends to abut the origin of the celiac artery though not extend into the vessel and not definitely result in hemodynamically significant stenosis. The supply to the celiac artery is primarily via the false lumen. Conventional branching pattern. SMA: Supplied via the true lumen. Conventional branching pattern. The distal tributaries the SMA are widely patent without discrete intraluminal filling defect to suggest distal embolism. Right Renal artery: Solitary; supplied by the false lumen. Widely patent without hemodynamically significant narrowing. No vessel irregularity to suggest FMD. Left Renal artery: Duplicated ; both right-sided renal arteries are supplied by the true lumen. Both co-dominant left-sided renal arteries are widely patent without hemodynamically significant narrowing. No vessel irregularity to suggest FMD. IMA: Supplied by the true lumen. Widely patent without hemodynamically significant narrowing. Pelvic vasculature: As above, there is no longer extension of the thoracic aortic dissection thrombus to involve the right common and external iliac arteries. These vessels now remain widely patent. The bilateral external and internal iliac arteries are widely patent without hemodynamically significant narrowing. There is a thrombosed fem fem bypass graft with mild aneurysmal dilatation adjacent to the anastomosis. The bilateral deep and  superficial femoral arteries are widely patent throughout their imaged course. Review of the MIP images confirms the above findings. -------------------------------------------------------------------------------- Nonvascular Findings: Evaluation of the abdominal organs is largely limited to the arterial phase of enhancement. Normal hepatic contour. No discrete hyper enhancing hepatic lesions. Normal noncontrast appearance of the gallbladder. No radiopaque gallstones. No intra or extrahepatic biliary duct dilatation. No ascites. There is slightly delayed enhancement of the right kidney in comparison to the left secondary to the right kidney being supplied via the false lumen of the abdominal aortic dissection. No renal stones. No urinary obstruction or perinephric stranding. Normal appearance of the bilateral adrenal glands, pancreas and spleen. Large left-sided indirect inguinal hernia which is noted to again contain multiple loops of nondilated small bowel, not resulting in enteric obstruction. Moderate to large colonic stool burden. The bowel is otherwise normal in course and caliber without wall thickening. Normal appearance of the terminal ileum and appendix. No pneumoperitoneum, pneumatosis or portal venous gas. No bulky retroperitoneal, mesenteric, pelvic or inguinal lymphadenopathy. Scattered calcifications within normal sized prostate gland. Normal appearance of the urinary bladder given degree distention. No free fluid in the pelvic cul-de-sac. No acute or aggressive osseous abnormalities within the abdomen or pelvis. Incidental note is made of a nondisplaced left-sided L5 pars defects without associated anterolisthesis. Regional soft tissues appear otherwise normal. Review of the MIP images confirms the above findings. IMPRESSION: Vascular Impression of the chest: 1. Post open repair of the ascending thoracic aorta without evidence of complication. 2. Persistent findings of a type A dissection originating  immediately adjacent to the distal end of the open bypass graft. 3. Interval enlargement of the aortic arch and descending thoracic  aorta (aortic arch now measures 48 mm in diameter, previously, 34 mm). No periaortic stranding. Nonvascular Impression of the chest: 1. No acute cardiopulmonary disease. 2. Moderate centrilobular and paraseptal emphysematous change. 3. Punctate (approximately 0.5 cm) nodule within the right middle lobe is unchanged since the 09/2014 examination. Given risk factors for bronchogenic carcinoma, follow-up chest CT at 6 - 12 months is recommended. This recommendation follows the consensus statement: Guidelines for Management of Small Pulmonary Nodules Detected on CT Scans: A Statement from the Fleischner Society as published in Radiology 2005;237:395-400. 4. Punctate (approximately 0.9 cm) hypoattenuating nodule with the left lobe of the thyroid. Further evaluation with dedicated thyroid ultrasound could be performed as clinically indicated. ----------------------------------------------------------------------------------- Vascular Impression of the abdomen and pelvis: 1. There is persistent extension of the thoracic aortic dissection now terminating at the level of the mid/distal abdominal aorta. The dissection is again noted to abut the origin of both the celiac and SMA though not extend into either vessel. The right renal artery as well as the majority of the celiac artery are supplied via the false lumen. 2. Interval increase in size of the abdominal aorta now measuring 3 cm in diameter, previously, 2.3 cm. 3. The dissection no longer extends to involve the right common iliac artery. Both the right common and external iliac arteries are now appear widely patent. As such, the fem-fem bypass graft is occluded. Nonvascular Impression of the abdomen and pelvis: 1. Grossly unchanged large left-sided indirect inguinal hernia which is again noted to contain multiple loops of nondilated small  bowel. Electronically Signed   By: Simonne Come M.D.   On: 04/10/2015 11:51    Recent Lab Findings: Lab Results  Component Value Date   WBC 5.7 02/03/2015   HGB 13.3 02/03/2015   HCT 40.4 02/03/2015   PLT 295 02/03/2015   GLUCOSE 85 02/03/2015   CHOL 125 02/03/2015   TRIG 60 02/03/2015   HDL 50 02/03/2015   LDLCALC 63 02/03/2015   ALT 10 02/03/2015   AST 12 02/03/2015   NA 140 02/03/2015   K 4.5 02/03/2015   CL 104 02/03/2015   CREATININE 1.14 02/03/2015   BUN 14 02/03/2015   CO2 27 02/03/2015   INR 1.59* 10/07/2014      Assessment / Plan:   Patient status post type I aortic dissection now approximate 10  months post repair,    Patient has persistent dissection flap in the distal arch and descending aorta, proximal pair appears intact on exam he has no evidence of aortic insufficiency, aortic size stable from scan 4 months ago The fem-femoral bypass is clotted but he has preserved flow to both lower extremities without symptoms We'll plan a follow-up CT of the chest for 6 months to evaluate any change in size of his aorta He's been cautioned to make sure he continues with his beta blocker for blood pressure control I have increased his dose of lopressor to 25 mg bid, and he is referred back to cardiology for follow up on bp control. May need additional mediacation He has no primary care   [name] MD      [address] [place] [address] Office [phone]   Beeper [phone]  08/28/2015 9:20 AM

## 2015-09-05 ENCOUNTER — Ambulatory Visit: Payer: BLUE CROSS/BLUE SHIELD | Admitting: Internal Medicine

## 2015-09-23 ENCOUNTER — Ambulatory Visit (INDEPENDENT_AMBULATORY_CARE_PROVIDER_SITE_OTHER): Payer: BLUE CROSS/BLUE SHIELD | Admitting: Internal Medicine

## 2015-09-23 ENCOUNTER — Encounter: Payer: Self-pay | Admitting: Internal Medicine

## 2015-09-23 VITALS — BP 122/74 | HR 91 | Ht 67.0 in | Wt 170.8 lb

## 2015-09-23 DIAGNOSIS — I7101 Dissection of thoracic aorta: Secondary | ICD-10-CM | POA: Diagnosis not present

## 2015-09-23 DIAGNOSIS — I10 Essential (primary) hypertension: Secondary | ICD-10-CM

## 2015-09-23 DIAGNOSIS — I71019 Dissection of thoracic aorta, unspecified: Secondary | ICD-10-CM

## 2015-09-23 NOTE — Patient Instructions (Signed)
Your physician recommends that you continue on your current medications as directed. Please refer to the Current Medication list given to you today.  Your physician recommends that you schedule a follow-up appointment as needed with Dr. Hilty.  

## 2015-09-23 NOTE — Progress Notes (Signed)
OFFICE NOTE  Chief Complaint:  Hypertension follow-up  Primary Care Physician: No PCP Per Patient  HPI:  Victor [name] is a 60 y.o. male with a history of tobacco abuse, polysubstance abuse and recent thoracic aortic dissection. No family history of coronary artery disease. He was admitted from June 12 of 10/14/2014 and underwent repair of the ascending aortic dissection with replacement of the ascending aorta and suspension of the aortic valve with cardiopulmonary bypass. TEE during surgery revealed an ejection fraction of 60-65%.He was recently seen by Victor Finlay, PA-C-C and follow-up. He was noted to not be taking his beta blocker at the time. He says he may never have had this prescription filled. Otherwise he is asymptomatic. He denies any chest pain or shortness of breath. He was on iron in the hospital for anemia and is questioning whether he needs to be on this today or not. He reports he has stopped smoking which I've encouraged him to maintain.  09/23/2015  Victor [name] returns today for follow-up. He was recently seen by Dr. Ofilia [name], who noted that his blood pressure was elevated. He increased his metoprolol to 25 mg twice a day. Today's blood pressure is well-controlled at 122/74. He denies any side effects from the increased dose of Lopressor. He is on aspirin 325 mg daily. EKG shows normal sinus rhythm at 91 with biatrial enlargement. He denies any chest pain or worsening shortness of breath. He is physically active. He has been having problems apparently with a hernia and is requesting a referral for primary care physician.  PMHx:  Past Medical History  Diagnosis Date  . Thoracic aortic aneurysm (HCC) 09/2014    Past Surgical History  Procedure Laterality Date  . Thoracic aortic aneurysm repair N/A 10/06/2014    Procedure: THORACIC ASCENDING ANEURYSM REPAIR (AAA);  Surgeon: Victor Ovens, MD;  Location: [facility] OR;  Service: Open Heart Surgery;  Laterality: N/A;   hyportermia circulatory arrest and resuspension of aortic valve  . Femoral-femoral bypass graft N/A 10/07/2014    Procedure: LEFT  FEMORAL ARTERY -RIGHT FEMORAL ARTERY BYPASS GRAFT USING X 30 CM HEMASHIELD GOLD GRAFT;  Surgeon: Victor Earthly, MD;  Location: [facility] OR;  Service: Vascular;  Laterality: N/A;    FAMHx:  Family History  Problem Relation Age of Onset  . Heart murmur Mother     SOCHx:   reports that he quit smoking about a year ago. His smoking use included Cigarettes and Cigars. He quit after 35 years of use. He has never used smokeless tobacco. He reports that he does not drink alcohol or use illicit drugs.  ALLERGIES:  No Known Allergies  ROS: Pertinent items noted in HPI and remainder of comprehensive ROS otherwise negative.  HOME MEDS: Current Outpatient Prescriptions  Medication Sig Dispense Refill  . aspirin EC 325 MG EC tablet Take 1 tablet (325 mg total) by mouth daily.    . metoprolol tartrate (LOPRESSOR) 25 MG tablet Take 1 tablet (25 mg total) by mouth 2 (two) times daily. 180 tablet 3   No current facility-administered medications for this visit.    LABS/IMAGING: No results found for this or any previous visit (from the past 48 hour(s)). No results found.  WEIGHTS: Wt Readings from Last 3 Encounters:  09/23/15 170 lb 12.8 oz (77.474 kg)  08/28/15 164 lb (74.39 kg)  04/11/15 164 lb (74.39 kg)    VITALS: BP 122/74 mmHg  Pulse 91  Ht  (1.702 m)  Wt  170 lb 12.8 oz (77.474 kg)  BMI 26.74 kg/m2  SpO2 96%  EXAM: General appearance: alert and no distress Neck: no carotid bruit and no JVD Lungs: clear to auscultation bilaterally Heart: regular rate and rhythm, S1, S2 normal, no murmur, click, rub or gallop Abdomen: soft, non-tender; bowel sounds normal; no masses,  no organomegaly Extremities: extremities normal, atraumatic, no cyanosis or edema Pulses: 2+ and symmetric Skin: Skin color, texture, turgor normal. No rashes or lesions Neurologic:  Grossly normal Psych: Pleasant  EKG: Normal sinus rhythm at 91, biatrial enlargement  ASSESSMENT: 1. Thoracic aortic dissection s/p repair by Dr. Tyrone [name] in 2016 2. Ischemic leg 3. Iron deficiency anemia 4. Essential hypertension  PLAN: 1.   Victor [name] had thoracic aortic dissection which was successfully operated on in 2016. Followed up with thoracic surgery and seems to be stable. Blood pressure recently was elevated however has responded to an increase in metoprolol. He is currently well controlled. I provided a referral for primary care physician today for him. He has enough prescriptions to last for at least 6 months and we would be happy to refill those as needed. He can follow-up with me as needed as well.  Victor NoseKenneth C. Daisa Stennis, MD, [facility] County Memorial HospitalFACC Attending Cardiologist [facility]  Victor [name] 09/23/2015, 1:09 PM

## 2016-02-19 ENCOUNTER — Other Ambulatory Visit: Payer: Self-pay | Admitting: Cardiothoracic Surgery

## 2016-02-19 DIAGNOSIS — I71019 Dissection of thoracic aorta, unspecified: Secondary | ICD-10-CM

## 2016-02-19 DIAGNOSIS — I7101 Dissection of thoracic aorta: Secondary | ICD-10-CM

## 2016-03-25 ENCOUNTER — Other Ambulatory Visit: Payer: Self-pay

## 2016-03-25 ENCOUNTER — Ambulatory Visit: Payer: Self-pay | Admitting: Cardiothoracic Surgery

## 2016-05-19 ENCOUNTER — Ambulatory Visit: Payer: Self-pay | Admitting: Surgery

## 2016-05-19 NOTE — H&P (Signed)
  Victor [name] 05/19/2016 3:53 PM Location: [facility] Patient #: [id number] DOB: [birth year] Single / Language: [place] / Race: Black or African American Male  History of Present Illness (Amyra Vantuyl A. Fredricka Bonineonnor MD; 05/19/2016 4:45 PM) Patient words: This is a very nice 61 year old gentleman who has had a left inguinal hernia for probably about 8 years. It is not reducible. It causes him some mild discomfort but in general is not painful. He denies nausea, vomiting, issues with constipation or diarrhea, denies issues with urination. He is interested in having the hernia repaired at this point.  Of note he had an aortic root replacement in June 2016 for aneurysm/dissection. The dissection was complicated by a right lower extremity ischemia necessitating a femorofemoral bypass. He seems to have recovered uneventfully from that hospitalization.  He works as a Paediatric nursebarber, but since his heart surgery is used to chair to sit and while he works.  The patient is a 61 year old male.   Allergies Juanita Craver(Armen Glenn, CMA; 05/19/2016 3:54 PM) No Known Drug Allergies 05/19/2016  Medication History Juanita Craver(Armen Glenn, CMA; 05/19/2016 3:55 PM) Oxycodone-Acetaminophen (10-325MG  Tablet, Oral) Active. Metoprolol Tartrate (25MG  Tablet, Oral) Active. Medications Reconciled     Review of Systems (Aaren Atallah A. Fredricka Bonineonnor MD; 05/19/2016 4:45 PM) All other systems negative  Vitals (Armen Glenn CMA; 05/19/2016 3:54 PM) 05/19/2016 3:54 PM Weight: 170 lb Height: 67in Body Surface Area: 1.89 m Body Mass Index: 26.63 kg/m  Temp.: 98.22F  Pulse: 80 (Regular)  P.OX: 98% (Room air) BP: 140/98 (Sitting, Left Arm, Standard)      Physical Exam (Dyllin Gulley A. Fredricka Bonineonnor MD; 05/19/2016 4:47 PM)  General Note: Alert, well-appearing  Integumentary Note: No lesions or rashes on limited skin exam  Head and Neck Note: No mass or thyromegaly  Eye Note: Anicteric, pupils equally round and reactive  Chest and  Lung Exam Note: Unlabored respirations, symmetrical air entry  Cardiovascular Note: Regular rate and rhythm, no pedal edema  Abdomen Note: Soft, nontender, nondistended. No mass or organomegaly. No umbilical hernia. There is a palpable femorofemoral graft in the lower abdomen. This appears to swing out lateral to a very large and only partially reducible left inguinal hernia. I cannot see the scars from the femorofemoral operation.  Neurologic Note: Grossly intact, normal gait  Neuropsychiatric Note: Normal mood and affect, appropriate insight  Musculoskeletal Note: Strength symmetrical, no deformity    Assessment & Plan (Paolo Okane A. Fredricka Bonineonnor MD; 05/19/2016 4:49 PM)  LEFT INGUINAL HERNIA (K40.90) Story: This is been going on for quite some time and is quite a large hernia. I discussed with him open repair and we discussed the risk of chronic pain, bleeding, infection, injury to bowel or other intraabdominal structures, injury to the testicular vessels, vas or testicle with possible orchiectomy, and risk of hernia recurrence. He will need cardiac clearance prior to surgery.

## 2016-05-27 ENCOUNTER — Ambulatory Visit (INDEPENDENT_AMBULATORY_CARE_PROVIDER_SITE_OTHER): Payer: Self-pay | Admitting: Cardiothoracic Surgery

## 2016-05-27 ENCOUNTER — Encounter: Payer: Self-pay | Admitting: Cardiothoracic Surgery

## 2016-05-27 ENCOUNTER — Ambulatory Visit
Admission: RE | Admit: 2016-05-27 | Discharge: 2016-05-27 | Disposition: A | Discharge status: Home / Self Care | Payer: No Typology Code available for payment source | Source: Ambulatory Visit | Attending: Cardiothoracic Surgery | Admitting: Cardiothoracic Surgery

## 2016-05-27 VITALS — BP 134/87 | HR 100 | Resp 16 | Ht 67.0 in | Wt 170.0 lb

## 2016-05-27 DIAGNOSIS — I71019 Dissection of thoracic aorta, unspecified: Secondary | ICD-10-CM

## 2016-05-27 DIAGNOSIS — I7101 Dissection of thoracic aorta: Secondary | ICD-10-CM

## 2016-05-27 DIAGNOSIS — Z09 Encounter for follow-up examination after completed treatment for conditions other than malignant neoplasm: Secondary | ICD-10-CM

## 2016-05-27 MED ORDER — IOPAMIDOL (ISOVUE-370) INJECTION 76%
100.0000 mL | Freq: Once | INTRAVENOUS | Status: AC | PRN
  Administered 2016-05-27: 100 mL via INTRAVENOUS

## 2016-05-27 NOTE — Progress Notes (Signed)
[address]       [place] [address]             [phone]                    XZAYVION VAETH [facility] Medical Record #[id number] Date of Birth: [birth year]  Referring: Chrystie Nose, MD Primary Care: Billee Cashing, MD  Chief Complaint:    Chief Complaint  Patient presents with  . Routine Post Op    8 month f/u s/p REPAIR OF THORACIC AORTIC DISSECTION 10/06/14 with CTA CHEST   10/06/2014 OPERATIVE REPORT PREOPERATIVE DIAGNOSIS: Acute type 1 aortic dissection originating just at the takeoff of the right coronary ostium extending into the right external iliac artery. 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 SURGICAL PROCEDURE: Repair of ascending aortic dissection with replacement of ascending aorta and resuspension of the aortic valve with cardiopulmonary bypass and hypothermic circulatory arrest. SURGEON: Sheliah Plane, MD  History of Present Illness:    Victor [name] 61 y.o. male is seen in the office  today for follow-up after repair of acute type I aortic dissection and subsequent need for femorofemoral bypass because of ischemic right leg in the early postoperative period. Since surgery the patient has stopped smoking. His major complaint remains  his large inguinal hernia.  He is ambulating without difficulty, denies claudication or rest pain in his feet. He has been taking Lopressor twice a day. 12.5 bid   He notes he has seen general surgery to get his hernia repaired   Current Activity/ Functional Status:  Patient is independent with mobility/ambulation, transfers, ADL's, IADL's.   Zubrod Score: At the time of surgery this patient's most appropriate activity status/level should be described as: []     0    Normal activity, no symptoms [x]     1    Restricted in physical strenuous activity but  ambulatory, able to do out light work []     2    Ambulatory and capable of self care, unable to do work activities, up and about               >50 % of waking hours                              []     3    Only limited self care, in bed greater than 50% of waking hours []     4    Completely disabled, no self care, confined to bed or chair []     5    Moribund   Past Medical History:  Diagnosis Date  . Thoracic aortic aneurysm (HCC) 09/2014    Past Surgical History:  Procedure Laterality Date  . FEMORAL-FEMORAL BYPASS GRAFT N/A 10/07/2014   Procedure: LEFT  FEMORAL ARTERY -RIGHT FEMORAL ARTERY BYPASS GRAFT USING X 30 CM HEMASHIELD GOLD GRAFT;  Surgeon: Larina Earthly, MD;  Location: [facility] OR;  Service: Vascular;  Laterality: N/A;  . THORACIC AORTIC ANEURYSM REPAIR N/A 10/06/2014   Procedure: THORACIC ASCENDING ANEURYSM REPAIR (AAA);  Surgeon: [name], MD;  Location: [facility] OR;  Service: Open Heart Surgery;  Laterality: N/A;  hyportermia circulatory arrest and resuspension of aortic valve    Family History  Problem Relation Age of Onset  . Heart murmur Mother  Social History   Social History  . Marital status: Single    Spouse name: N/A  . Number of children: N/A  . Years of education: N/A   Occupational History  . Not on file.   Social History Main Topics  . Smoking status: Former Smoker    Years: 35.00    Types: Cigarettes, Cigars    Quit date: 10/07/2014  . Smokeless tobacco: Never Used  . Alcohol use No  . Drug use: No  . Sexual activity: Not on file   Other Topics Concern  . Not on file   Social History Narrative  . No narrative on file    History  Smoking Status  . Former Smoker  . Years: 35.00  . Types: Cigarettes, Cigars  . Quit date: 10/07/2014  Smokeless Tobacco  . Never Used    History  Alcohol Use No     No Known Allergies  Current Outpatient Prescriptions  Medication Sig Dispense Refill  . aspirin EC 325 MG EC tablet Take 1 tablet  (325 mg total) by mouth daily.    . metoprolol tartrate (LOPRESSOR) 25 MG tablet Take 1 tablet (25 mg total) by mouth 2 (two) times daily. 180 tablet 3  . oxyCODONE-acetaminophen (PERCOCET) 10-325 MG tablet Take 1 tablet by mouth every 8 (eight) hours as needed for pain.     No current facility-administered medications for this visit.       Review of Systems:     Cardiac Review of Systems: Y or N  Chest Pain [  n  ]  Resting SOB [  n ] Exertional SOB  [n Orthopnea [  n]   Pedal Edema [n   ]    Palpitations Milo.Brash  ] Syncope  [ n ]   Presyncope [ n  ]  General Review of Systems: [Y] = yes [  ]=no Constitional: recent weight change [  ];  Wt loss over the last 3 months [   ] anorexia [  ]; fatigue [  ]; nausea [  ]; night sweats [  ]; fever [  ]; or chills [  ];          Dental: poor dentition[  ]; Last Dentist visit:   Eye : blurred vision [  ]; diplopia [   ]; vision changes [  ];  Amaurosis fugax[  ]; Resp: cough [  ];  wheezing[  ];  hemoptysis[  ]; shortness of breath[  ]; paroxysmal nocturnal dyspnea[  ]; dyspnea on exertion[  ]; or orthopnea[  ];  GI:  gallstones[  ], vomiting[  ];  dysphagia[  ]; melena[  ];  hematochezia [  ]; heartburn[  ];   Hx of  Colonoscopy[  ]; GU: kidney stones [  ]; hematuria[  ];   dysuria [  ];  nocturia[  ];  history of     obstruction [  ]; urinary frequency [  ]             Skin: rash, swelling[  ];, hair loss[  ];  peripheral edema[  ];  or itching[  ]; Musculosketetal: myalgias[  ];  joint swelling[  ];  joint erythema[  ];  joint pain[  ];  back pain[  ];  Heme/Lymph: bruising[  ];  bleeding[  ];  anemia[  ];  Neuro: TIA[  ];  headaches[  ];  stroke[  ];  vertigo[  ];  seizures[  ];   paresthesias[n  ];  difficulty walking[ n ];  Psych:depression[  ]; anxiety[  ];  Endocrine: diabetes[  ];  thyroid dysfunction[  ];  Immunizations: Flu up to date [ n ]; Pneumococcal up to date Milo.Brash[n  ];  Other:  Physical Exam: BP 134/87 (BP Location: Right Arm, Patient  Position: Sitting, Cuff Size: Large)   Pulse 100   Resp 16   Ht 5\' 7"  (1.702 m)   Wt 170 lb (77.1 kg)   SpO2 97% Comment: ON RA  BMI 26.63 kg/m   PHYSICAL EXAMINATION: General appearance: alert, cooperative and no distress Head: Normocephalic, without obvious abnormality, atraumatic Neck: no adenopathy, no carotid bruit, no JVD, supple, symmetrical, trachea midline and thyroid not enlarged, symmetric, no tenderness/mass/nodules Lymph nodes: Cervical, supraclavicular, and axillary nodes normal. Resp: clear to auscultation bilaterally Back: symmetric, no curvature. ROM normal. No CVA tenderness. Cardio: regular rate and rhythm, S1, S2 normal, no murmur, click, rub or gallop, no click, no rub  In addition there is no   murmur of aortic insufficiency is appreciated GI: soft, non-tender; bowel sounds normal; no masses,  no organomegaly Extremities: extremities normal, atraumatic, no cyanosis or edema, Homans sign is negative, no sign of DVT and The patient has 1+ DP and PT pulses palpable at the right ankle 2+ DP and PT pulses palpable at the left ankle, he has palpable bilateral femoral pulses I do not appreciate any pulse in the femorofemoral crossover graft, fem fem bypass known to be clotted  Neurologic: Grossly normal Very large left inguinal hernias  Still present present  Diagnostic Studies & Laboratory data:     Recent Radiology Findings:   Ct Angio Chest Aorta W/cm &/or Wo/cm  Result Date: 05/27/2016 CLINICAL DATA:  61 year old male with history of thoracic aortic dissection status post surgical repair in February 2016. Followup study. EXAM: CT ANGIOGRAPHY CHEST WITH CONTRAST TECHNIQUE: Multidetector CT imaging of the chest was performed using the standard protocol during bolus administration of intravenous contrast. Multiplanar CT image reconstructions and MIPs were obtained to evaluate the vascular anatomy. CONTRAST:  100 mL of Isovue 370. COMPARISON:  Chest CT 08/28/2015. FINDINGS:  Cardiovascular: Heart size is normal. There is no significant pericardial fluid, thickening or pericardial calcification. There is aortic atherosclerosis, as well as atherosclerosis of the great vessels of the mediastinum and the coronary arteries, including calcified atherosclerotic plaque in the left anterior descending and right coronary arteries. Status post median sternotomy for graft replacement of the ascending thoracic aorta. The proximal and distal anastomoses of the graft are intact. No abnormal fluid collection around the graft. Immediately distal to the distal anastomosis there is an aortic dissection, which extends into the abdominal aorta (below the lower margin of the images). The dissection flap does not extend into the great vessels. The dissection flap over rides the origin of the celiac axis which appears to be fed by the false lumen. Aneurysmal dilatation of the distal aortic arch which measures up to 5.3 cm in diameter. There is also aneurysmal dilatation of the descending thoracic aorta which measures up to 4.2 x 4.7 cm (mean diameter of 4.5 cm). On the precontrast images there is no crescentic high attenuation associated with the wall of the thoracic aorta to suggest acute intramural hemorrhage. Mediastinum/Nodes: Multiple borderline enlarged and mildly enlarged mediastinal lymph nodes measuring up to 1 cm in short axis in the right paratracheal nodal stations, similar to the prior study. Esophagus is unremarkable in appearance. No axillary lymphadenopathy. Lungs/Pleura: New 3 x 5 mm (mean diameter  4 mm) pulmonary nodule in the right middle lobe (image 103 of series 14). No other larger more suspicious appearing pulmonary nodules or masses are noted. Scattered areas of mild linear scarring are noted throughout the lung bases. No acute consolidative airspace disease. No pleural effusions. Moderate paraseptal emphysema most notable in the lung apices. Upper Abdomen: Unremarkable.  Musculoskeletal: Median sternotomy wires. There are no aggressive appearing lytic or blastic lesions noted in the visualized portions of the skeleton. Review of the MIP images confirms the above findings. IMPRESSION: 1. Chronic type A thoracic aortic dissection status post graft repair of the ascending thoracic aorta, with persistent aneurysmal dilatation of the distal aortic arch (5.3 cm in diameter) and the descending thoracic aorta (mean diameter 4.5 cm), as above. No acute findings. 2. 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 3. Moderate paraseptal emphysema. 4.  2 vessel coronary artery disease. Electronically Signed   By: Trudie Reed M.D.   On: 05/27/2016 12:21   Ct Angio Chest Aorta W/cm &/or Wo/cm  08/28/2015  CLINICAL DATA:  Followup thoracic aneurysm repair EXAM: CT ANGIOGRAPHY CHEST WITH CONTRAST TECHNIQUE: Multidetector CT imaging of the chest was performed using the standard protocol during bolus administration of intravenous contrast. Multiplanar CT image reconstructions and MIPs were obtained to evaluate the vascular anatomy. CONTRAST:  75 mL Isovue 370 COMPARISON:  04/10/2015 FINDINGS: Lungs are well aerated bilaterally. Diffuse emphysematous changes are seen. A 6 mm nodule is again noted in the right middle lobe best seen on image number 80 of series 5. Thoracic inlet again demonstrates the thyroid to be somewhat enlarged in size. Previously seen left thyroid nodule is again noted and stable. There again noted changes consistent with ascending aortic repair. Persistent dissection flap is noted in the mid thoracic arch extending into the descending thoracic aorta. This is stable in appearance from the prior exam. The false lumen occupies the majority of the descending thoracic aorta. The brachiocephalic vessels are supplied by the true lumen. As is the celiac axis and superior mesenteric artery.  The overall appearance of the aorta is stable from the prior exam. No new focal abnormality is seen. The pulmonary artery as visualized is within normal limits. No hilar or mediastinal adenopathy is noted. The visualized upper abdomen demonstrates decreased perfusion of the right kidney similar to that seen on the prior exam likely related to supplied from the false lumen. The osseous structures show no acute abnormality. Review of the MIP images confirms the above findings. IMPRESSION: Emphysematous changes. Stable 6 mm nodule in the right middle lobe. Stable changes of ascending aortic repair. There is a persistent dissection flap extending from the aortic arch to the descending aorta. This is stable from the prior exam. No new focal abnormality is noted. Electronically Signed   By: Alcide Clever M.D.   On: 08/28/2015 08:51    Ct Angio Chest Aorta W/cm &/or Wo/cm  04/10/2015  CLINICAL DATA:  History of dissection abdominal aortic aneurysm. Former smoker. EXAM: CT ANGIOGRAPHY CHEST, ABDOMEN AND PELVIS TECHNIQUE: Multidetector CT imaging through the chest, abdomen and pelvis was performed using the standard protocol during bolus administration of intravenous contrast. Multiplanar reconstructed images and MIPs were obtained and reviewed to evaluate the vascular anatomy. CONTRAST:  75 cc SVC 70 COMPARISON:  CT the chest, abdomen pelvis - 10/06/2014 FINDINGS: CTA CHEST FINDINGS Vascular Findings: The patient has undergone open repair of the ascending thoracic aorta. The bypass graft  is widely patent. There is a minimal amount of ill-defined stranding within the anterior mediastinum which is favored to be postoperative. No contrast extravasation. Review of the precontrast images are negative for the presence of an intramural hematoma. There is a persistent dissection involving the cranial most aspect of the ascending thoracic aorta extending through the aortic arch and the descending thoracic aorta to the level of  the mid/distal aspect of the abdominal aorta. Both the true and false lumens of the abdominal aortic dissection remain widely patent through the level of the descending thoracic aorta. There has been interval enlargement of the aortic arch and descending thoracic aorta. The aortic arch now measures approximately 48 mm in diameter (previously, 34 mm) while the distal descending thoracic aorta now measures approximately 37 mm, previously, 32 mm. No evidence of periaortic stranding. Normal heart size. No pericardial effusion. Although this examination was not tailored for the evaluation the pulmonary arteries, there are no discrete filling defects within the central pulmonary arterial tree to suggest central pulmonary embolism. ------------------------------------------------------------- Thoracic aortic measurements: Sinotubular junction 30 mm measured in greatest oblique coronal dimension. Proximal ascending aorta The bypass graft measures approximately 35 mm in greatest oblique short axis diameter as measured in greatest oblique axial dimension at the level of the main pulmonary artery. Aortic arch aorta 48 mm as measured in greatest oblique sagittal dimension, previously 34 mm, an approximately 46 mm in greatest oblique short axis axial diameter (image 36, series 4), previously, 35 mm. Proximal descending thoracic aorta 41 mm as measured in greatest oblique axial dimension at the level of the main pulmonary artery, previously, 36 mm Distal descending thoracic aorta 37 mm as measured in greatest oblique axial dimension at the level of the diaphragmatic hiatus, previously 32 mm Review of the MIP images confirms the above findings. ------------------------------------------------------------- Non-Vascular Findings: Minimal dependent subpleural ground-glass atelectasis, left greater than right. No discrete focal airspace opacities. No pleural effusion or pneumothorax. There is minimal subsegmental atelectasis along the  right minor fissure. Mild apical predominant paraseptal and centrilobular emphysematous change, right greater than left. Punctate (approximately 0.6 cm) noncalcified ground-glass nodule within in the right middle lobe (image 39, series 5), unchanged since the 09/2010/2016 examination. Scattered prominent mediastinal lymph nodes with index right sided precarinal lymph node measuring 1.1 cm in greatest short axis diameter (image 45, series 4). Additional scattered mediastinal lymph nodes are numerous though individually not enlarged by size criteria with index precarinal lymph node measuring 0.8 cm in greatest short axis diameter, likely reactive in etiology. No hilar axillary lymphadenopathy. Post median sternotomy. No acute or aggressive osseous abnormalities. There is diffuse heterogeneity of the thyroid gland with a suspected punctate (approximately 0.9 cm) hypo attenuating nodule within the left lobe of the thyroid (image 15, series 4). Review of the MIP images confirms the above findings. --------------------------------------------------------------------------------- CTA ABDOMEN AND PELVIS FINDINGS Vascular Findings: Abdominal aorta: The thoracic aortic dissection extends through the mid/ distal aspects of the abdominal aorta. The false lumen of the thoracic aortic dissection is again noted to supply the right renal artery as well as the majority of supplied to the celiac artery. While there is expected slightly delayed enhancement of the right kidney in relation to the left, there is no evidence of end organ ischemic change. The dissection is again noted to abut but not extend into the origin of the celiac artery as well as the SMA. Interval increase in size of the abdominal aorta now measuring 3 cm in diameter (image 147, series  4), previously, 2.3 cm, an approximately 3 cm in greatest oblique coronal short axis diameter (image 69, series 601), previously, 2.4 cm. The dissection terminates at the level just  caudal to the take-off of the IMA. The distal abdominal aorta is widely patent as is the previously thrombosed right common and external iliac arteries. Celiac artery: As above, the dissection extends to abut the origin of the celiac artery though not extend into the vessel and not definitely result in hemodynamically significant stenosis. The supply to the celiac artery is primarily via the false lumen. Conventional branching pattern. SMA: Supplied via the true lumen. Conventional branching pattern. The distal tributaries the SMA are widely patent without discrete intraluminal filling defect to suggest distal embolism. Right Renal artery: Solitary; supplied by the false lumen. Widely patent without hemodynamically significant narrowing. No vessel irregularity to suggest FMD. Left Renal artery: Duplicated ; both right-sided renal arteries are supplied by the true lumen. Both co-dominant left-sided renal arteries are widely patent without hemodynamically significant narrowing. No vessel irregularity to suggest FMD. IMA: Supplied by the true lumen. Widely patent without hemodynamically significant narrowing. Pelvic vasculature: As above, there is no longer extension of the thoracic aortic dissection thrombus to involve the right common and external iliac arteries. These vessels now remain widely patent. The bilateral external and internal iliac arteries are widely patent without hemodynamically significant narrowing. There is a thrombosed fem fem bypass graft with mild aneurysmal dilatation adjacent to the anastomosis. The bilateral deep and superficial femoral arteries are widely patent throughout their imaged course. Review of the MIP images confirms the above findings. -------------------------------------------------------------------------------- Nonvascular Findings: Evaluation of the abdominal organs is largely limited to the arterial phase of enhancement. Normal hepatic contour. No discrete hyper enhancing  hepatic lesions. Normal noncontrast appearance of the gallbladder. No radiopaque gallstones. No intra or extrahepatic biliary duct dilatation. No ascites. There is slightly delayed enhancement of the right kidney in comparison to the left secondary to the right kidney being supplied via the false lumen of the abdominal aortic dissection. No renal stones. No urinary obstruction or perinephric stranding. Normal appearance of the bilateral adrenal glands, pancreas and spleen. Large left-sided indirect inguinal hernia which is noted to again contain multiple loops of nondilated small bowel, not resulting in enteric obstruction. Moderate to large colonic stool burden. The bowel is otherwise normal in course and caliber without wall thickening. Normal appearance of the terminal ileum and appendix. No pneumoperitoneum, pneumatosis or portal venous gas. No bulky retroperitoneal, mesenteric, pelvic or inguinal lymphadenopathy. Scattered calcifications within normal sized prostate gland. Normal appearance of the urinary bladder given degree distention. No free fluid in the pelvic cul-de-sac. No acute or aggressive osseous abnormalities within the abdomen or pelvis. Incidental note is made of a nondisplaced left-sided L5 pars defects without associated anterolisthesis. Regional soft tissues appear otherwise normal. Review of the MIP images confirms the above findings. IMPRESSION: Vascular Impression of the chest: 1. Post open repair of the ascending thoracic aorta without evidence of complication. 2. Persistent findings of a type A dissection originating immediately adjacent to the distal end of the open bypass graft. 3. Interval enlargement of the aortic arch and descending thoracic aorta (aortic arch now measures 48 mm in diameter, previously, 34 mm). No periaortic stranding. Nonvascular Impression of the chest: 1. No acute cardiopulmonary disease. 2. Moderate centrilobular and paraseptal emphysematous change. 3. Punctate  (approximately 0.5 cm) nodule within the right middle lobe is unchanged since the 09/2014 examination. Given risk factors for bronchogenic carcinoma, follow-up  chest CT at 6 - 12 months is recommended. This recommendation follows the consensus statement: Guidelines for Management of Small Pulmonary Nodules Detected on CT Scans: A Statement from the Fleischner Society as published in Radiology 2005;237:395-400. 4. Punctate (approximately 0.9 cm) hypoattenuating nodule with the left lobe of the thyroid. Further evaluation with dedicated thyroid ultrasound could be performed as clinically indicated. ----------------------------------------------------------------------------------- Vascular Impression of the abdomen and pelvis: 1. There is persistent extension of the thoracic aortic dissection now terminating at the level of the mid/distal abdominal aorta. The dissection is again noted to abut the origin of both the celiac and SMA though not extend into either vessel. The right renal artery as well as the majority of the celiac artery are supplied via the false lumen. 2. Interval increase in size of the abdominal aorta now measuring 3 cm in diameter, previously, 2.3 cm. 3. The dissection no longer extends to involve the right common iliac artery. Both the right common and external iliac arteries are now appear widely patent. As such, the fem-fem bypass graft is occluded. Nonvascular Impression of the abdomen and pelvis: 1. Grossly unchanged large left-sided indirect inguinal hernia which is again noted to contain multiple loops of nondilated small bowel. Electronically Signed   By: Simonne Come M.D.   On: 04/10/2015 11:51    Recent Lab Findings: Lab Results  Component Value Date   WBC 5.7 02/03/2015   HGB 13.3 02/03/2015   HCT 40.4 02/03/2015   PLT 295 02/03/2015   GLUCOSE 85 02/03/2015   CHOL 125 02/03/2015   TRIG 60 02/03/2015   HDL 50 02/03/2015   LDLCALC 63 02/03/2015   ALT 10 02/03/2015   AST 12  02/03/2015   NA 140 02/03/2015   K 4.5 02/03/2015   CL 104 02/03/2015   CREATININE 1.14 02/03/2015   BUN 14 02/03/2015   CO2 27 02/03/2015   INR 1.59 (H) 10/07/2014      Assessment / Plan:   Patient status post type I aortic dissection June 2016 Patient has persistent dissection flap in the distal arch and descending aorta, proximal pair appears intact on exam he has no evidence of aortic insufficiency,  The fem-femoral bypass is clotted but he has preserved flow to both lower extremities without symptoms  He's been cautioned to make sure he continues with his beta blocker for blood pressure control Chronic type A thoracic aortic dissection status post graft repair of the ascending thoracic aorta, with persistent aneurysmal dilatation of the distal aortic arch (5.3 cm in diameter) and the descending thoracic aorta (mean diameter 4.5 New 3 x 5 mm (mean diameter 4 mm) pulmonary nodule in the right middle lobe (image 103 of series 14) 2 vessel coronary artery disease by ct   We'll plan a follow-up CT abdomen and pelvis of the chest for 6 months to evaluate any change in size of his aorta and pulmonary nodule   [name] MD      [address] [place] [address] Office [phone]   Beeper [phone]  05/27/2016 2:28 PM

## 2016-06-01 ENCOUNTER — Encounter: Payer: Self-pay | Admitting: Cardiology

## 2016-06-10 ENCOUNTER — Telehealth: Payer: Self-pay | Admitting: Physician Assistant

## 2016-06-10 NOTE — Telephone Encounter (Signed)
[facility] on 06/21/16 with Azalee CourseHao Meng, PA.  Records put with Hao's schedule for 06/21/16. lp

## 2016-06-11 ENCOUNTER — Telehealth: Payer: Self-pay | Admitting: Physician Assistant

## 2016-06-11 NOTE — Telephone Encounter (Signed)
NOTES FAXED TO [facility] ON 06/11/16.

## 2016-06-21 ENCOUNTER — Ambulatory Visit: Payer: Self-pay | Admitting: Cardiology

## 2016-06-21 ENCOUNTER — Ambulatory Visit (INDEPENDENT_AMBULATORY_CARE_PROVIDER_SITE_OTHER): Payer: Self-pay | Admitting: Physician Assistant

## 2016-06-21 ENCOUNTER — Encounter: Payer: Self-pay | Admitting: Physician Assistant

## 2016-06-21 VITALS — BP 147/95 | HR 88 | Ht 67.0 in | Wt 174.2 lb

## 2016-06-21 DIAGNOSIS — K4090 Unilateral inguinal hernia, without obstruction or gangrene, not specified as recurrent: Secondary | ICD-10-CM

## 2016-06-21 DIAGNOSIS — I10 Essential (primary) hypertension: Secondary | ICD-10-CM

## 2016-06-21 DIAGNOSIS — Z9889 Other specified postprocedural states: Secondary | ICD-10-CM

## 2016-06-21 DIAGNOSIS — Z01818 Encounter for other preprocedural examination: Secondary | ICD-10-CM

## 2016-06-21 NOTE — Patient Instructions (Signed)
Medication Instructions:  Continue current medications  Labwork: None Ordered  Testing/Procedures: None Ordered  Follow-Up: Your physician recommends that you schedule a follow-up appointment in: As Needed  Any Other Special Instructions Will Be Listed Below (If Applicable).  You are cleared from a cardiac standpoint for your upcoming surgery  If you need a refill on your cardiac medications before your next appointment, please call your pharmacy.

## 2016-06-21 NOTE — Progress Notes (Signed)
Cardiology Office Note    Date:  06/21/2016   ID:  Victor [name], DOB [birth year], MRN [id number]  PCP:  Billee Cashing, MD  Cardiologist:  Dr. Rennis Golden  Preop clearance requested by Dr. Twana First with [facility]  Chief Complaint  Patient presents with  . Pre-op Exam    seen and case discussed with Dr. Rennis Golden, for preop clearance requested for L hernia repair by Dr. Phylliss Blakes    History of Present Illness:  Victor [name] is a 61 y.o. male with PMH of tobacco abuse, polysubstance abuse and h/o thoracic aortic dissection s/p repair. He has no family history of CAD. He was admitted in June 2016 and underwent repair of ascending aortic dissection with replacement of ascending aorta and the suspicion of the aortic valve with cardiopulmonary bypass. TEE during the surgery revealed ejection fraction of 60-65%. He also required femorofemoral bypass due to ischemic right leg in the early postoperative period. He was last seen in office on 09/23/2015, at which time his metoprolol has already been increased to 25 mg twice a day by Dr. Tyrone Sage, his blood pressure was well-controlled and he was doing well from cardiology perspective. Recently he has followed up with CT surgery on 05/27/2016, it appears the patient was noted to be on 12.5 mg twice a day (?). Otherwise his only complaint was his large inguinal hernia which he has followed up with general surgery. 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  He presents today for preoperative clearance for left inguinal hernia repair by Dr. Twana First. I clarified with the patient regarding his home dose of Lopressor, he says he is taking a full tablet of 25 mg in the morning and also a full tablet at night. He  did not take his blood pressure medication today because he has not eaten anything. Otherwise, he has not experienced any recent angina, any shortness of breath, lower extremity, orthopnea or paroxysmal nocturnal dyspnea. He has been dealing with a left inguinal hernia for roughly 8 years. Despite previous history of urgent femorofemoral bypass, he does not have any symptom of claudication with ambulation. From cardiology perspective, he has been doing quite well with no complaints. He is cleared from cardiology standpoint without further workup. He is deemed low risk for the intended procedure. I have discussed his case with his primary cardiologist Dr. Rennis Golden who also agrees. Although recent CT angiogram of the chest does reveal two-vessel CAD, however as long as he is not having exertional chest discomfort, I do not think we need to do any ischemic workup.   Past Medical History:  Diagnosis Date  . Thoracic aortic aneurysm (HCC) 09/2014    Past Surgical History:  Procedure Laterality Date  . FEMORAL-FEMORAL BYPASS GRAFT N/A 10/07/2014   Procedure: LEFT  FEMORAL ARTERY -RIGHT FEMORAL ARTERY BYPASS GRAFT USING X 30 CM HEMASHIELD GOLD GRAFT;  Surgeon: Larina Earthly, MD;  Location: [facility] OR;  Service: Vascular;  Laterality: N/A;  . THORACIC AORTIC ANEURYSM REPAIR N/A 10/06/2014   Procedure: THORACIC ASCENDING ANEURYSM REPAIR (AAA);  Surgeon: [name], MD;  Location: [facility] OR;  Service: Open Heart Surgery;  Laterality: N/A;  hyportermia circulatory arrest and resuspension of aortic valve    Current Medications: Outpatient Medications Prior to Visit  Medication Sig  Dispense Refill  . aspirin EC 325 MG EC tablet Take 1 tablet (325 mg total) by mouth daily.    . metoprolol tartrate (LOPRESSOR) 25 MG tablet Take 1 tablet (25 mg total) by mouth 2 (two) times daily. 180 tablet 3  . oxyCODONE-acetaminophen (PERCOCET) 10-325 MG tablet Take 1 tablet by mouth every 8 (eight) hours as needed for pain.      No facility-administered medications prior to visit.      Allergies:   Patient has no known allergies.   Social History   Social History  . Marital status: Single    Spouse name: N/A  . Number of children: N/A  . Years of education: N/A   Social History Main Topics  . Smoking status: Former Smoker    Years: 35.00    Types: Cigarettes, Cigars    Quit date: 10/07/2014  . Smokeless tobacco: Never Used  . Alcohol use No  . Drug use: No  . Sexual activity: Not Asked   Other Topics Concern  . None   Social History Narrative  . None     Family History:  The patient's family history includes Heart murmur in his mother.   ROS:   Please see the history of present illness.    ROS All other systems reviewed and are negative.   PHYSICAL EXAM:   VS:  BP (!) 147/95   Pulse 88   Ht 5\' 7"  (1.702 m)   Wt 174 lb 3.2 oz (79 kg)   SpO2 98%   BMI 27.28 kg/m    GEN: Well nourished, well developed, in no acute distress  HEENT: normal  Neck: no JVD, carotid bruits, or masses Cardiac: RRR; no murmurs, rubs, or gallops,no edema  Respiratory:  clear to auscultation bilaterally, normal work of breathing GI: soft, nontender, nondistended, + BS  Inguinal hernia not examined.  MS: no deformity or atrophy  Skin: warm and dry, no rash Neuro:  Alert and Oriented x 3, Strength and sensation are intact Psych: euthymic mood, full affect  Wt Readings from Last 3 Encounters:  06/21/16 174 lb 3.2 oz (79 kg)  05/27/16 170 lb (77.1 kg)  09/23/15 170 lb 12.8 oz (77.5 kg)      Studies/Labs Reviewed:   EKG:  EKG is ordered today.  The ekg ordered today demonstrates Normal sinus rhythm without significant ST-T wave changes, heart rate 88.  Recent Labs: No results found for requested labs within last 8760 hours.   Lipid Panel    Component Value Date/Time   CHOL 125 02/03/2015 0936   TRIG 60 02/03/2015 0936   HDL 50 02/03/2015 0936   CHOLHDL 2.5 02/03/2015 0936   VLDL 12 02/03/2015  0936   LDLCALC 63 02/03/2015 0936    Additional studies/ records that were reviewed today include:   CTA of chest 05/27/2016  IMPRESSION: 1. Chronic type A thoracic aortic dissection status post graft repair of the ascending thoracic aorta, with persistent aneurysmal dilatation of the distal aortic arch (5.3 cm in diameter) and the descending thoracic aorta (mean diameter 4.5 cm), as above. No acute findings. 2. 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 3. Moderate paraseptal emphysema. 4.  2 vessel coronary artery disease.   ASSESSMENT:    1. Pre-op exam   2. Essential hypertension   3. S/P aortic dissection repair   4. Left inguinal hernia      PLAN:  In order of problems  listed above:  1. Preoperative clearance for left inguinal hernia repair by Dr. Phylliss Blakes - He does not have any angina or heart failure symptoms. Recent CT angiogram of the chest does reveal two-vessel CAD, however given the lack of symptom, no further workup is needed. He is cleared from cardiology perspective to pursue surgery, he is low risk for the intended procedure. I have also discussed this with patient's primary cardiologist Dr. Rennis Golden who also agrees.  - He is concerned that his femorofemoral bypass is near the area and it will interfere with the surgery, I will defer further this question to the expertise of his general surgeon and direct questions regarding the femorofemoral bypass graft with Dr. Bosie Helper steam who did the surgery back in 2016.   2. Type A aortic dissection s/p repair: He has no acute issues, recent CT angiogram of the chest does reveal distal aneurysmal changes, Dr. Tyrone Sage is aware of this.   3. HTN: Blood pressure is the main issue in controlling his type A aortic dissection. His blood pressure is elevated today, however he says he did not take his metoprolol this morning. He  did state in the last 2 weeks, he missed roughly 4-5 doses, I encouraged more compliance with his blood pressure medication. It does appear while on the 25 mg twice a day of metoprolol, his blood pressure is very well controlled.    Medication Adjustments/Labs and Tests Ordered: Current medicines are reviewed at length with the patient today.  Concerns regarding medicines are outlined above.  Medication changes, Labs and Tests ordered today are listed in the Patient Instructions below. Patient Instructions  Medication Instructions:  Continue current medications  Labwork: None Ordered  Testing/Procedures: None Ordered  Follow-Up: Your physician recommends that you schedule a follow-up appointment in: As Needed  Any Other Special Instructions Will Be Listed Below (If Applicable).  You are cleared from a cardiac standpoint for your upcoming surgery  If you need a refill on your cardiac medications before your next appointment, please call your pharmacy.      Ramond Dial, [place]  06/21/2016 11:05 AM    [facility] [address] [place], [place], [place]  [address] Phone: [phone]; Fax: [phone]

## 2016-09-03 ENCOUNTER — Other Ambulatory Visit: Payer: Self-pay | Admitting: Internal Medicine

## 2016-11-12 ENCOUNTER — Other Ambulatory Visit: Payer: Self-pay | Admitting: *Deleted

## 2016-11-12 DIAGNOSIS — I7101 Dissection of thoracic aorta: Secondary | ICD-10-CM

## 2016-11-12 DIAGNOSIS — I71019 Dissection of thoracic aorta, unspecified: Secondary | ICD-10-CM

## 2016-12-08 NOTE — Progress Notes (Signed)
[address]       [place] [address]             [phone]                    TRESTON COKER [facility] Medical Record #[id number] Date of Birth: [birth year]  Referring: Chrystie Nose, MD Primary Care: Billee Cashing, MD  Chief Complaint:    Chief Complaint  Patient presents with  . Thoracic Aortic Dissection    6 month f/u with CTA Chest/ABD/Pelvis   10/06/2014 OPERATIVE REPORT PREOPERATIVE DIAGNOSIS: Acute type 1 aortic dissection originating just at the takeoff of the right coronary ostium extending into the right external iliac artery. 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 SURGICAL PROCEDURE: Repair of ascending aortic dissection with replacement of ascending aorta and resuspension of the aortic valve with cardiopulmonary bypass and hypothermic circulatory arrest. SURGEON: Sheliah Plane, MD  History of Present Illness:    LAN MCNEILL 61 y.o. male is seen in the office  today for follow-up after repair of acute type I aortic dissection and subsequent need for femorofemoral bypass because of ischemic right leg in the early postoperative period. Since surgery the patient has stopped smoking. He has not had his large inguinal hernia repaired yet.  He is ambulating without difficulty, denies claudication or rest pain in his feet. 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 He's had no evidence of congestive heart failure or angina since last seen. He has been taking Lopressor twice a day. 12.5 bid   Patient comes in today for follow-up CTA of the chest abdomen and pelvis.   Current Activity/ Functional Status:  Patient is independent with mobility/ambulation, transfers, ADL's, IADL's.   Zubrod Score: At the time of surgery this patient's most appropriate activity status/level should be described as: []      0    Normal activity, no symptoms [x]     1    Restricted in physical strenuous activity but ambulatory, able to do out light work []     2    Ambulatory and capable of self care, unable to do work activities, up and about               >50 % of waking hours                              []     3    Only limited self care, in bed greater than 50% of waking hours []     4    Completely disabled, no self care, confined to bed or chair []     5    Moribund   Past Medical History:  Diagnosis Date  . Thoracic aortic aneurysm (HCC) 09/2014    Past Surgical History:  Procedure Laterality Date  . FEMORAL-FEMORAL BYPASS GRAFT N/A 10/07/2014   Procedure: LEFT  FEMORAL ARTERY -RIGHT FEMORAL ARTERY BYPASS GRAFT USING X 30 CM HEMASHIELD GOLD GRAFT;  Surgeon: Larina Earthly, MD;  Location: [facility] OR;  Service: Vascular;  Laterality: N/A;  . THORACIC AORTIC ANEURYSM REPAIR N/A 10/06/2014   Procedure: THORACIC ASCENDING ANEURYSM REPAIR (AAA);  Surgeon: [name], MD;  Location: [facility] OR;  Service: Open Heart Surgery;  Laterality: N/A;  hyportermia circulatory arrest and resuspension of aortic  valve    Family History  Problem Relation Age of Onset  . Heart murmur Mother     Social History   Social History  . Marital status: Single    Spouse name: N/A  . Number of children: N/A  . Years of education: N/A   Occupational History  . Not on file.   Social History Main Topics  . Smoking status: Former Smoker    Years: 35.00    Types: Cigarettes, Cigars    Quit date: 10/07/2014  . Smokeless tobacco: Never Used  . Alcohol use No  . Drug use: No  . Sexual activity: Not on file   Other Topics Concern  . Not on file   Social History Narrative  . No narrative on file    History  Smoking Status  . Former Smoker  . Years: 35.00  . Types: Cigarettes, Cigars  . Quit date: 10/07/2014  Smokeless Tobacco  . Never Used    History  Alcohol Use No     No Known Allergies  Current Outpatient  Prescriptions  Medication Sig Dispense Refill  . aspirin EC 325 MG EC tablet Take 1 tablet (325 mg total) by mouth daily.    . metoprolol tartrate (LOPRESSOR) 25 MG tablet take 1 tablet by mouth twice a day 180 tablet 0  . oxyCODONE-acetaminophen (PERCOCET) 10-325 MG tablet Take 1 tablet by mouth every 8 (eight) hours as needed for pain.     No current facility-administered medications for this visit.    Facility-Administered Medications Ordered in Other Visits  Medication Dose Route Frequency Provider Last Rate Last Dose  . iopamidol (ISOVUE-370) 76 % injection 75 mL  75 mL Intravenous Once PRN [name], MD          Review of Systems:     Cardiac Review of Systems: Y or N  Chest Pain [  n  ]  Resting SOB [  n ] Exertional SOB  [n Orthopnea [  n]   Pedal Edema [n   ]    Palpitations Milo.Brash  ] Syncope  [ n ]   Presyncope [ n  ]  General Review of Systems: [Y] = yes [  ]=no Constitional: recent weight change [  ];  Wt loss over the last 3 months [   ] anorexia [  ]; fatigue [n]; nausea [  ]; night sweats [  ]; fever [  ]; or chills [  ];          Dental: poor dentition[  ]; Last Dentist visit:   Eye : blurred vision [n  ]; diplopia Milo.Brash   ]; vision changes [ n ];  Amaurosis fugax[n  ]; Resp: cough [  ];  wheezing[  ];  hemoptysis[  ]; shortness of breath[  ]; paroxysmal nocturnal dyspnea[ n ]; dyspnea on exertion[ n ]; or orthopnea[  ];  GI:  gallstones[  ], vomiting[  ];  dysphagia[  ]; melena[  ];  hematochezia [  ]; heartburn[  ];   Hx of  Colonoscopy[  ]; GU: kidney stones [  ]; hematuria[ n ];   dysuria [ n ];  nocturia[  ];  history of     obstruction [  ]; urinary frequency [  ]             Skin: rash, swelling[  ];, hair loss[  ];  peripheral edema[  ];  or itching[  ]; Musculosketetal: myalgias[ n ];  joint  swelling[ n ];  joint erythema[n  ];  joint pain[  ];  back pain[n  ];  Heme/Lymph: bruising[  ];  bleeding[  ];  anemia[  ];  Neuro: TIA[  ];  headaches[  ];  stroke[  ];   vertigo[  ];  seizures[  ];   paresthesias[n  ];  difficulty walking[ n ];  Psych:depression[  ]; anxiety[  ];  Endocrine: diabetes[  ];  thyroid dysfunction[  ];  Immunizations: Flu up to date [ n ]; Pneumococcal up to date Milo.Brash  ];  Other:  Physical Exam: BP 123/86   Pulse 92   Resp 20   Ht 5\' 7"  (1.702 m)   Wt 167 lb (75.8 kg)   SpO2 99% Comment: RA  BMI 26.16 kg/m   PHYSICAL EXAMINATION: Physical Exam  Constitutional: He is oriented to person, place, and time. No distress.  HENT:  Mouth/Throat: No oropharyngeal exudate.  Eyes: Right eye exhibits no discharge. Left eye exhibits no discharge. No scleral icterus.  Neck: No JVD present. No tracheal deviation present. No thyromegaly present.  Cardiovascular: Exam reveals no gallop and no friction rub.   No murmur heard. Respiratory: No stridor. No respiratory distress. He has no wheezes. He has no rales. He exhibits no tenderness.  GI: Soft. Bowel sounds are normal. He exhibits mass. He exhibits no distension. There is no tenderness. There is no rebound and no guarding.  Musculoskeletal: He exhibits no edema, tenderness or deformity.  Lymphadenopathy:    He has no cervical adenopathy.  Neurological: He is alert and oriented to person, place, and time.  Skin: Skin is warm and dry. He is not diaphoretic.  Psychiatric: He has a normal mood and affect. His behavior is normal. Judgment and thought content normal.  Large left inguinal hernia The patient has 1+ DP and PT pulses palpable at the right ankle 2+ DP and PT pulses palpable at the left ankle, he has palpable bilateral femoral pulses I do not appreciate any pulse in the femorofemoral crossover graft, fem fem bypass known to be clotted   Diagnostic Studies & Laboratory data:     Recent Radiology Findings:   Result Date: 12/09/2016 CLINICAL DATA:  61 year old male with chronic aortic dissection. EXAM: CT ANGIOGRAPHY CHEST, ABDOMEN AND PELVIS TECHNIQUE: Multidetector CT imaging  through the chest, abdomen and pelvis was performed using the standard protocol during bolus administration of intravenous contrast. Multiplanar reconstructed images and MIPs were obtained and reviewed to evaluate the vascular anatomy. Creatinine was obtained on site at [facility] at [address] Results: Creatinine 1.2 mg/dL. CONTRAST:  75 mL Isovue 370 COMPARISON:  Most recent prior chest CTA 05/27/2016 ; most recent prior CT scan of the chest, abdomen and pelvis 04/10/2015 FINDINGS: CTA CHEST FINDINGS Cardiovascular: Surgical changes of prior operative repair of the ascending portion of the known Stanford a thoracic aortic aneurysm. The tube graft remains unchanged in appearance. The dissection flap begins immediately beyond the distal anastomosis and extends throughout the thoracic aorta and into the abdominal aorta. Progressive aneurysmal dilatation of the aortic infundibulum now measuring 5.6 cm compared to 5.3 cm previously (confirmed on coronal reformatted images). The aneurysmal descending thoracic aorta remains unchanged in caliber measuring 5.5 cm at the level of the origin of the main pulmonary artery and 4.5 cm just proximal to the aortic hiatus. The arch vessels remain tortuous. The dissection flap does not extend into the vessels. Main pulmonary artery remains normal in size. The heart remains  normal in size. No pericardial effusion. Mediastinum/Nodes: Similar appearance of heterogeneous multinodular thyroid gland. Mildly prominent right para-aortic lymph nodes again noted without significant change compared to 2016. These are likely reactive. No new adenopathy or mediastinal mass. Unremarkable thoracic esophagus. Lungs/Pleura: Paraseptal pulmonary emphysema with bullous change at the right lung apex. No evidence of pleural effusion, focal airspace consolidation, pulmonary edema or pneumothorax. Respiratory motion artifact noted in the lower lungs bilaterally. No suspicious nodule or  mass. Musculoskeletal: No acute fracture or aggressive appearing lytic or blastic osseous lesion. Healed median sternotomy. Review of the MIP images confirms the above findings. CTA ABDOMEN AND PELVIS FINDINGS VASCULAR Aorta: Chronic dissection of the the abdominal aorta again noted. The dissection flap extends into the infrarenal abdominal aorta and terminates proximal to the inferior mesenteric artery. There has been some remodeling of the intramural hematoma in the distal aorta compared to December of 2016. The visceral aorta remains mildly aneurysmal at 4.7 cm which is unchanged compared to prior. The remainder of the aorta remains within normal limits in size. Celiac: Fenestration of the dissection flap at the origin of the celiac artery. Thus, the celiac artery arises from both the true and false lumen. Widely patent. No aneurysm. SMA: Arises from the true lumen. Widely patent. No aneurysm or dissection. Renals: Fenestration at the level of the renal arteries providing cross flow. The right renal artery arises from the false lumen while the left renal artery arises from the true lumen. The renal arteries are normal in caliber. No evidence of dissection or fibromuscular dysplasia. IMA: Patent and unremarkable. Inflow: Relatively spared from disease.  Widely patent. Outflow: Ectasia of the bilateral common femoral artery is likely at sites of prior endarterectomy. There is a completely excluded fem-fem bypass graft. The visualized portions of the superficial and profunda femoral arteries are widely patent. Veins: No obvious venous abnormality within the limitations of this arterial phase study. Review of the MIP images confirms the above findings. NON-VASCULAR Hepatobiliary: Normal hepatic contour and morphology. No discrete hepatic lesions. Normal appearance of the gallbladder. No intra or extrahepatic biliary ductal dilatation. Pancreas: Unremarkable. No pancreatic ductal dilatation or surrounding inflammatory  changes. Spleen: Normal in size without focal abnormality. Adrenals/Urinary Tract: Normal adrenal glands. Differential enhancement. There is relatively decreased enhancement of the right renal parenchyma. No evidence of hydronephrosis or nephrolithiasis. 2.2 cm intermediate attenuation in the lower pole of the right kidney has slowly grown in size compared to the initial CT scan from June of 2016 at which time it measured 1.6 cm. Small low-attenuation cyst exophytic from the upper pole of the left kidney remains unchanged and is likely a simple cyst. Unremarkable ureters and bladder. Stomach/Bowel: No focal bowel wall thickening or evidence of obstruction. Large left inguinal hernia containing multiple loops of small bowel and mesenteric. Normal appendix in the right lower quadrant. Lymphatic: No suspicious lymphadenopathy. Reproductive: Prostate is unremarkable. Other: Large left inguinal hernia.  Negative for ascites. Musculoskeletal: No acute fracture or aggressive appearing lytic or blastic osseous lesion. Right-sided facet arthropathy at L5-S1. Review of the MIP images confirms the above findings. IMPRESSION: CTA CHEST 1. Enlarging chronic aneurysmal thoracic aortic dissection. The aortic infundibulum now measures 5.6 cm compared to 5.3 cm on 05/27/2016 and 4.1 cm at the time of presentation in June of 2016. No evidence of propagation of the dissection flap. 2. Prior surgical repair of Stanford type A ascending thoracic aortic aneurysm with stable appearance of the ascending aortic tube graft. 3. Stable chronic aneurysmal dissection  of the descending thoracic aorta. 4. Paraseptal emphysema with bullous change in the right lung apex. CTA ABD/PELVIS 1. Slight interval remodeling of the distal extent of the abdominal aortic dissection. Previously, the dissection flap extended more inferiorly as a thrombosed false lumen/intramural hematoma. The intramural hematoma component has resorbed over time and there is less  overall dilatation of the distal abdominal aorta in the region of the inferior mesenteric artery. Otherwise, stable dissection flap and ectasia of the super renal and juxtarenal abdominal aorta. No progression of aneurysmal dilatation or propagation of the dissection flap. 2. Enlarging intermediate attenuation lesion within the lower pole of the right kidney. This remains incompletely evaluated having not been included on prior noncontrast CT images. While it is possible that this represents a slowly enlarging complex cyst, a low grade renal neoplasm such as a papillary cell renal carcinoma is also a distinct possibility. Recommend further evaluation with renal protocol MRI of the abdomen with gadolinium contrast. 3. Persistent differential enhancement of the kidneys with relatively decreased enhancement of the right kidney (the right renal artery arises from the false lumen). No evidence of renal cortical atrophy. 4. Stable appearance of large left inguinal hernia containing multiple loops of small bowel and associated mesentery. 5. Additional ancillary findings as above without significant interval change. These results were called by telephone at the time of interpretation on 12/09/2016 at 12:14 pm to Dr. Sheliah Plane , who verbally acknowledged these results. Signed, Sterling Big, MD [facility] Electronically Signed   By: Malachy Moan M.D.   On: 12/09/2016 12:14   I have independently reviewed the above radiology studies  and reviewed the findings with the patient.   Ct Angio Chest Aorta W/cm &/or Wo/cm  Result Date: 05/27/2016 CLINICAL DATA:  61 year old male with history of thoracic aortic dissection status post surgical repair in February 2016. Followup study. EXAM: CT ANGIOGRAPHY CHEST WITH CONTRAST TECHNIQUE: Multidetector CT imaging of the chest was performed using the standard protocol during bolus administration of  intravenous contrast. Multiplanar CT image reconstructions and MIPs were obtained to evaluate the vascular anatomy. CONTRAST:  100 mL of Isovue 370. COMPARISON:  Chest CT 08/28/2015. FINDINGS: Cardiovascular: Heart size is normal. There is no significant pericardial fluid, thickening or pericardial calcification. There is aortic atherosclerosis, as well as atherosclerosis of the great vessels of the mediastinum and the coronary arteries, including calcified atherosclerotic plaque in the left anterior descending and right coronary arteries. Status post median sternotomy for graft replacement of the ascending thoracic aorta. The proximal and distal anastomoses of the graft are intact. No abnormal fluid collection around the graft. Immediately distal to the distal anastomosis there is an aortic dissection, which extends into the abdominal aorta (below the lower margin of the images). The dissection flap does not extend into the great vessels. The dissection flap over rides the origin of the celiac axis which appears to be fed by the false lumen. Aneurysmal dilatation of the distal aortic arch which measures up to 5.3 cm in diameter. There is also aneurysmal dilatation of the descending thoracic aorta which measures up to 4.2 x 4.7 cm (mean diameter of 4.5 cm). On the precontrast images there is no crescentic high attenuation associated with the wall of the thoracic aorta to suggest acute intramural hemorrhage. Mediastinum/Nodes: Multiple borderline enlarged and mildly enlarged mediastinal lymph nodes measuring up to 1 cm in short axis in the right paratracheal nodal stations, similar to the prior study. Esophagus is unremarkable  in appearance. No axillary lymphadenopathy. Lungs/Pleura: New 3 x 5 mm (mean diameter 4 mm) pulmonary nodule in the right middle lobe (image 103 of series 14). No other larger more suspicious appearing pulmonary nodules or masses are noted. Scattered areas of mild linear scarring are noted  throughout the lung bases. No acute consolidative airspace disease. No pleural effusions. Moderate paraseptal emphysema most notable in the lung apices. Upper Abdomen: Unremarkable. Musculoskeletal: Median sternotomy wires. There are no aggressive appearing lytic or blastic lesions noted in the visualized portions of the skeleton. Review of the MIP images confirms the above findings. IMPRESSION: 1. Chronic type A thoracic aortic dissection status post graft repair of the ascending thoracic aorta, with persistent aneurysmal dilatation of the distal aortic arch (5.3 cm in diameter) and the descending thoracic aorta (mean diameter 4.5 cm), as above. No acute findings. 2. 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 3. Moderate paraseptal emphysema. 4.  2 vessel coronary artery disease. Electronically Signed   By: Trudie Reed M.D.   On: 05/27/2016 12:21   Ct Angio Chest Aorta W/cm &/or Wo/cm  08/28/2015  CLINICAL DATA:  Followup thoracic aneurysm repair EXAM: CT ANGIOGRAPHY CHEST WITH CONTRAST TECHNIQUE: Multidetector CT imaging of the chest was performed using the standard protocol during bolus administration of intravenous contrast. Multiplanar CT image reconstructions and MIPs were obtained to evaluate the vascular anatomy. CONTRAST:  75 mL Isovue 370 COMPARISON:  04/10/2015 FINDINGS: Lungs are well aerated bilaterally. Diffuse emphysematous changes are seen. A 6 mm nodule is again noted in the right middle lobe best seen on image number 80 of series 5. Thoracic inlet again demonstrates the thyroid to be somewhat enlarged in size. Previously seen left thyroid nodule is again noted and stable. There again noted changes consistent with ascending aortic repair. Persistent dissection flap is noted in the mid thoracic arch extending into the descending thoracic aorta. This is stable in appearance from the prior exam. The  false lumen occupies the majority of the descending thoracic aorta. The brachiocephalic vessels are supplied by the true lumen. As is the celiac axis and superior mesenteric artery. The overall appearance of the aorta is stable from the prior exam. No new focal abnormality is seen. The pulmonary artery as visualized is within normal limits. No hilar or mediastinal adenopathy is noted. The visualized upper abdomen demonstrates decreased perfusion of the right kidney similar to that seen on the prior exam likely related to supplied from the false lumen. The osseous structures show no acute abnormality. Review of the MIP images confirms the above findings. IMPRESSION: Emphysematous changes. Stable 6 mm nodule in the right middle lobe. Stable changes of ascending aortic repair. There is a persistent dissection flap extending from the aortic arch to the descending aorta. This is stable from the prior exam. No new focal abnormality is noted. Electronically Signed   By: Alcide Clever M.D.   On: 08/28/2015 08:51    Ct Angio Chest Aorta W/cm &/or Wo/cm  04/10/2015  CLINICAL DATA:  History of dissection abdominal aortic aneurysm. Former smoker. EXAM: CT ANGIOGRAPHY CHEST, ABDOMEN AND PELVIS TECHNIQUE: Multidetector CT imaging through the chest, abdomen and pelvis was performed using the standard protocol during bolus administration of intravenous contrast. Multiplanar reconstructed images and MIPs were obtained and reviewed to evaluate the vascular anatomy. CONTRAST:  75 cc SVC 70 COMPARISON:  CT the chest, abdomen pelvis - 10/06/2014 FINDINGS: CTA CHEST FINDINGS Vascular Findings: The  patient has undergone open repair of the ascending thoracic aorta. The bypass graft is widely patent. There is a minimal amount of ill-defined stranding within the anterior mediastinum which is favored to be postoperative. No contrast extravasation. Review of the precontrast images are negative for the presence of an intramural hematoma.  There is a persistent dissection involving the cranial most aspect of the ascending thoracic aorta extending through the aortic arch and the descending thoracic aorta to the level of the mid/distal aspect of the abdominal aorta. Both the true and false lumens of the abdominal aortic dissection remain widely patent through the level of the descending thoracic aorta. There has been interval enlargement of the aortic arch and descending thoracic aorta. The aortic arch now measures approximately 48 mm in diameter (previously, 34 mm) while the distal descending thoracic aorta now measures approximately 37 mm, previously, 32 mm. No evidence of periaortic stranding. Normal heart size. No pericardial effusion. Although this examination was not tailored for the evaluation the pulmonary arteries, there are no discrete filling defects within the central pulmonary arterial tree to suggest central pulmonary embolism. ------------------------------------------------------------- Thoracic aortic measurements: Sinotubular junction 30 mm measured in greatest oblique coronal dimension. Proximal ascending aorta The bypass graft measures approximately 35 mm in greatest oblique short axis diameter as measured in greatest oblique axial dimension at the level of the main pulmonary artery. Aortic arch aorta 48 mm as measured in greatest oblique sagittal dimension, previously 34 mm, an approximately 46 mm in greatest oblique short axis axial diameter (image 36, series 4), previously, 35 mm. Proximal descending thoracic aorta 41 mm as measured in greatest oblique axial dimension at the level of the main pulmonary artery, previously, 36 mm Distal descending thoracic aorta 37 mm as measured in greatest oblique axial dimension at the level of the diaphragmatic hiatus, previously 32 mm Review of the MIP images confirms the above findings. ------------------------------------------------------------- Non-Vascular Findings: Minimal dependent  subpleural ground-glass atelectasis, left greater than right. No discrete focal airspace opacities. No pleural effusion or pneumothorax. There is minimal subsegmental atelectasis along the right minor fissure. Mild apical predominant paraseptal and centrilobular emphysematous change, right greater than left. Punctate (approximately 0.6 cm) noncalcified ground-glass nodule within in the right middle lobe (image 39, series 5), unchanged since the 09/2010/2016 examination. Scattered prominent mediastinal lymph nodes with index right sided precarinal lymph node measuring 1.1 cm in greatest short axis diameter (image 45, series 4). Additional scattered mediastinal lymph nodes are numerous though individually not enlarged by size criteria with index precarinal lymph node measuring 0.8 cm in greatest short axis diameter, likely reactive in etiology. No hilar axillary lymphadenopathy. Post median sternotomy. No acute or aggressive osseous abnormalities. There is diffuse heterogeneity of the thyroid gland with a suspected punctate (approximately 0.9 cm) hypo attenuating nodule within the left lobe of the thyroid (image 15, series 4). Review of the MIP images confirms the above findings. --------------------------------------------------------------------------------- CTA ABDOMEN AND PELVIS FINDINGS Vascular Findings: Abdominal aorta: The thoracic aortic dissection extends through the mid/ distal aspects of the abdominal aorta. The false lumen of the thoracic aortic dissection is again noted to supply the right renal artery as well as the majority of supplied to the celiac artery. While there is expected slightly delayed enhancement of the right kidney in relation to the left, there is no evidence of end organ ischemic change. The dissection is again noted to abut but not extend into the origin of the celiac artery as well as the SMA. Interval increase in size  of the abdominal aorta now measuring 3 cm in diameter (image 147,  series 4), previously, 2.3 cm, an approximately 3 cm in greatest oblique coronal short axis diameter (image 69, series 601), previously, 2.4 cm. The dissection terminates at the level just caudal to the take-off of the IMA. The distal abdominal aorta is widely patent as is the previously thrombosed right common and external iliac arteries. Celiac artery: As above, the dissection extends to abut the origin of the celiac artery though not extend into the vessel and not definitely result in hemodynamically significant stenosis. The supply to the celiac artery is primarily via the false lumen. Conventional branching pattern. SMA: Supplied via the true lumen. Conventional branching pattern. The distal tributaries the SMA are widely patent without discrete intraluminal filling defect to suggest distal embolism. Right Renal artery: Solitary; supplied by the false lumen. Widely patent without hemodynamically significant narrowing. No vessel irregularity to suggest FMD. Left Renal artery: Duplicated ; both right-sided renal arteries are supplied by the true lumen. Both co-dominant left-sided renal arteries are widely patent without hemodynamically significant narrowing. No vessel irregularity to suggest FMD. IMA: Supplied by the true lumen. Widely patent without hemodynamically significant narrowing. Pelvic vasculature: As above, there is no longer extension of the thoracic aortic dissection thrombus to involve the right common and external iliac arteries. These vessels now remain widely patent. The bilateral external and internal iliac arteries are widely patent without hemodynamically significant narrowing. There is a thrombosed fem fem bypass graft with mild aneurysmal dilatation adjacent to the anastomosis. The bilateral deep and superficial femoral arteries are widely patent throughout their imaged course. Review of the MIP images confirms the above findings.  -------------------------------------------------------------------------------- Nonvascular Findings: Evaluation of the abdominal organs is largely limited to the arterial phase of enhancement. Normal hepatic contour. No discrete hyper enhancing hepatic lesions. Normal noncontrast appearance of the gallbladder. No radiopaque gallstones. No intra or extrahepatic biliary duct dilatation. No ascites. There is slightly delayed enhancement of the right kidney in comparison to the left secondary to the right kidney being supplied via the false lumen of the abdominal aortic dissection. No renal stones. No urinary obstruction or perinephric stranding. Normal appearance of the bilateral adrenal glands, pancreas and spleen. Large left-sided indirect inguinal hernia which is noted to again contain multiple loops of nondilated small bowel, not resulting in enteric obstruction. Moderate to large colonic stool burden. The bowel is otherwise normal in course and caliber without wall thickening. Normal appearance of the terminal ileum and appendix. No pneumoperitoneum, pneumatosis or portal venous gas. No bulky retroperitoneal, mesenteric, pelvic or inguinal lymphadenopathy. Scattered calcifications within normal sized prostate gland. Normal appearance of the urinary bladder given degree distention. No free fluid in the pelvic cul-de-sac. No acute or aggressive osseous abnormalities within the abdomen or pelvis. Incidental note is made of a nondisplaced left-sided L5 pars defects without associated anterolisthesis. Regional soft tissues appear otherwise normal. Review of the MIP images confirms the above findings. IMPRESSION: Vascular Impression of the chest: 1. Post open repair of the ascending thoracic aorta without evidence of complication. 2. Persistent findings of a type A dissection originating immediately adjacent to the distal end of the open bypass graft. 3. Interval enlargement of the aortic arch and descending thoracic  aorta (aortic arch now measures 48 mm in diameter, previously, 34 mm). No periaortic stranding. Nonvascular Impression of the chest: 1. No acute cardiopulmonary disease. 2. Moderate centrilobular and paraseptal emphysematous change. 3. Punctate (approximately 0.5 cm) nodule within the right middle lobe  is unchanged since the 09/2014 examination. Given risk factors for bronchogenic carcinoma, follow-up chest CT at 6 - 12 months is recommended. This recommendation follows the consensus statement: Guidelines for Management of Small Pulmonary Nodules Detected on CT Scans: A Statement from the Fleischner Society as published in Radiology 2005;237:395-400. 4. Punctate (approximately 0.9 cm) hypoattenuating nodule with the left lobe of the thyroid. Further evaluation with dedicated thyroid ultrasound could be performed as clinically indicated. ----------------------------------------------------------------------------------- Vascular Impression of the abdomen and pelvis: 1. There is persistent extension of the thoracic aortic dissection now terminating at the level of the mid/distal abdominal aorta. The dissection is again noted to abut the origin of both the celiac and SMA though not extend into either vessel. The right renal artery as well as the majority of the celiac artery are supplied via the false lumen. 2. Interval increase in size of the abdominal aorta now measuring 3 cm in diameter, previously, 2.3 cm. 3. The dissection no longer extends to involve the right common iliac artery. Both the right common and external iliac arteries are now appear widely patent. As such, the fem-fem bypass graft is occluded. Nonvascular Impression of the abdomen and pelvis: 1. Grossly unchanged large left-sided indirect inguinal hernia which is again noted to contain multiple loops of nondilated small bowel. Electronically Signed   By: Simonne Come M.D.   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 The fem-femoral bypass is clotted but he has preserved flow to both lowe extremities without symptoms On exam the patient has no evidence of aortic insufficiency 2 vessel coronary artery disease by ct  I reviewed with the patient the progressive enlargement of the proximal descending aorta and with its increasing size the need to intervene. In preparation for surgical intervention, will review the stent graft options. Obtain an echocardiogram to rule out aortic insufficiency, have patient see Dr. Rennis Golden for preop clearance- patient has known coronary calcification but is never formally had cardiac catheterization or evaluation of his coronary arteries.  Patient also has slowly enlarging complex mass in the right kidney, but may just be a cyst radiology's recommended MRI of the abdomen to evaluate his right kidney.  I'll see him back in approximate 3 weeks to further discuss her operative options for treatment.     [name] MD      [address] [place] [place] [address] Office [phone]   Beeper [phone]  12/09/2016 12:56 PM

## 2016-12-09 ENCOUNTER — Encounter: Payer: Self-pay | Admitting: Cardiothoracic Surgery

## 2016-12-09 ENCOUNTER — Ambulatory Visit
Admission: RE | Admit: 2016-12-09 | Discharge: 2016-12-09 | Disposition: A | Discharge status: Home / Self Care | Payer: Self-pay | Source: Ambulatory Visit | Attending: Cardiothoracic Surgery | Admitting: Cardiothoracic Surgery

## 2016-12-09 ENCOUNTER — Ambulatory Visit (INDEPENDENT_AMBULATORY_CARE_PROVIDER_SITE_OTHER): Payer: Self-pay | Admitting: Cardiothoracic Surgery

## 2016-12-09 ENCOUNTER — Other Ambulatory Visit: Payer: Self-pay | Admitting: *Deleted

## 2016-12-09 VITALS — BP 123/86 | HR 92 | Resp 20 | Ht 67.0 in | Wt 167.0 lb

## 2016-12-09 DIAGNOSIS — I351 Nonrheumatic aortic (valve) insufficiency: Secondary | ICD-10-CM

## 2016-12-09 DIAGNOSIS — I71019 Dissection of thoracic aorta, unspecified: Secondary | ICD-10-CM

## 2016-12-09 DIAGNOSIS — I7101 Dissection of thoracic aorta: Secondary | ICD-10-CM

## 2016-12-09 DIAGNOSIS — N2889 Other specified disorders of kidney and ureter: Secondary | ICD-10-CM

## 2016-12-09 MED ORDER — IOPAMIDOL (ISOVUE-370) INJECTION 76%: 75.0000 mL | Freq: Once | INTRAVENOUS | Status: DC | PRN

## 2016-12-10 ENCOUNTER — Telehealth: Payer: Self-pay | Admitting: Internal Medicine

## 2016-12-10 DIAGNOSIS — Z01810 Encounter for preprocedural cardiovascular examination: Secondary | ICD-10-CM

## 2016-12-10 DIAGNOSIS — I2584 Coronary atherosclerosis due to calcified coronary lesion: Secondary | ICD-10-CM

## 2016-12-10 DIAGNOSIS — I2510 Atherosclerotic heart disease of native coronary artery without angina pectoris: Secondary | ICD-10-CM

## 2016-12-10 NOTE — Telephone Encounter (Signed)
Patient called and aware of need for stress test per MD. He agrees with plan. Test ordered and staff message sent to [facility] to contact patient to arrange study.

## 2016-12-10 NOTE — Telephone Encounter (Signed)
-----   Message from Chrystie Nose, MD sent at 12/09/2016  3:29 PM EDT ----- Scheduled to see me for preoperative clearance for aneurysm repair on 8/30. Has coronary artery calcification. Echo scheduled. Would order a lexiscan myoview prior to that date if possible for preoperative risk stratification.  Thanks.  Dr. Rexene Edison ----- Message ----- From: [name], MD Sent: 12/09/2016   1:03 PM To: Billee Cashing, MD, Chrystie Nose, MD

## 2016-12-13 ENCOUNTER — Encounter: Payer: Self-pay | Admitting: Vascular Surgery

## 2016-12-15 ENCOUNTER — Encounter: Payer: Self-pay | Admitting: Vascular Surgery

## 2016-12-15 ENCOUNTER — Ambulatory Visit (HOSPITAL_COMMUNITY)
Admission: RE | Admit: 2016-12-15 | Discharge: 2016-12-15 | Disposition: A | Discharge status: Home / Self Care | Payer: Self-pay | Source: Ambulatory Visit | Attending: Internal Medicine | Admitting: Internal Medicine

## 2016-12-15 DIAGNOSIS — I2584 Coronary atherosclerosis due to calcified coronary lesion: Secondary | ICD-10-CM

## 2016-12-15 DIAGNOSIS — Z87891 Personal history of nicotine dependence: Secondary | ICD-10-CM | POA: Insufficient documentation

## 2016-12-15 DIAGNOSIS — I2510 Atherosclerotic heart disease of native coronary artery without angina pectoris: Secondary | ICD-10-CM

## 2016-12-15 DIAGNOSIS — Z01810 Encounter for preprocedural cardiovascular examination: Secondary | ICD-10-CM

## 2016-12-15 DIAGNOSIS — I10 Essential (primary) hypertension: Secondary | ICD-10-CM | POA: Insufficient documentation

## 2016-12-15 LAB — MYOCARDIAL PERFUSION IMAGING
CHL CUP NUCLEAR SDS: 2
CHL CUP NUCLEAR SRS: 1
CHL CUP NUCLEAR SSS: 3
CHL CUP RESTING HR STRESS: 80 {beats}/min
LV dias vol: 79 mL (ref 62–150)
LV sys vol: 32 mL
NUC STRESS TID: 1.11
Peak HR: 107 {beats}/min

## 2016-12-15 MED ORDER — REGADENOSON 0.4 MG/5ML IV SOLN
0.4000 mg | Freq: Once | INTRAVENOUS | Status: AC
  Administered 2016-12-15: 0.4 mg via INTRAVENOUS

## 2016-12-15 MED ORDER — TECHNETIUM TC 99M TETROFOSMIN IV KIT
10.8000 | PACK | Freq: Once | INTRAVENOUS | Status: AC | PRN
  Administered 2016-12-15: 10.8 via INTRAVENOUS
  Filled 2016-12-15: qty 11

## 2016-12-15 MED ORDER — TECHNETIUM TC 99M TETROFOSMIN IV KIT
32.0000 | PACK | Freq: Once | INTRAVENOUS | Status: AC | PRN
  Administered 2016-12-15: 32 via INTRAVENOUS
  Filled 2016-12-15: qty 32

## 2016-12-17 ENCOUNTER — Telehealth: Payer: Self-pay | Admitting: Surgery

## 2016-12-17 NOTE — Telephone Encounter (Signed)
Sched appt 12/20/16 at 4:00. Spoke to pt's mother.

## 2016-12-17 NOTE — Telephone Encounter (Signed)
-----   Message from Phillips Odor, RN sent at 12/17/2016 12:41 PM EDT ----- Regarding: needs appt. with [facility] ASAP to discuss stent graft   ----- Message ----- From: Davina Poke, RN Sent: 12/15/2016   1:45 PM To: Phillips Odor, RN  Hey,   Per Dr. Tyrone Sage this patient needs to be seen ASAP by Dr. Myra Gianotti for stent graft.  They discussed him today.    Thanks,  [employer]

## 2016-12-20 ENCOUNTER — Ambulatory Visit (INDEPENDENT_AMBULATORY_CARE_PROVIDER_SITE_OTHER): Payer: Self-pay | Admitting: Surgery

## 2016-12-20 ENCOUNTER — Encounter: Payer: Self-pay | Admitting: Surgery

## 2016-12-20 ENCOUNTER — Ambulatory Visit (HOSPITAL_COMMUNITY): Admission: RE | Admit: 2016-12-20 | Payer: Self-pay | Source: Ambulatory Visit

## 2016-12-20 VITALS — BP 137/94 | HR 99 | Temp 99.7°F | Ht 67.0 in | Wt 165.9 lb

## 2016-12-20 DIAGNOSIS — I712 Thoracic aortic aneurysm, without rupture, unspecified: Secondary | ICD-10-CM

## 2016-12-20 DIAGNOSIS — I6529 Occlusion and stenosis of unspecified carotid artery: Secondary | ICD-10-CM

## 2016-12-20 NOTE — Progress Notes (Signed)
[facility] [place]  Patient name: Victor [name] MRN: [id number] DOB: 03-10-1956 Sex: male   REQUESTING PROVIDER:    Dr. Tyrone Sage   REASON FOR CONSULT:    Thoracic aortic aneurysm  HISTORY OF PRESENT ILLNESS:   Victor [name] is a 61 y.o. male, who is Referred today for a thoracic aortic aneurysm.  The patient initially presented in June 2015 with a type I aortic dissection.  The patient also presented with an ischemic right leg.  He underwent repair of descending aortic dissection with replacement of the ascending aorta as well as resuspension of aortic valve.  A femoral-femoral bypass graft was also performed.  He has been followed for progressive enlargement of his thoracic aorta, which now measures 5.6 cm.  The patient has a large indirect inguinal hernia which she has not had repaired.  He is also been found to have a renal mass.  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  He denies any claudication symptoms.  He denies any neurologic symptoms.   PAST MEDICAL HISTORY    Past Medical History:  Diagnosis Date  . Thoracic aortic aneurysm (HCC) 09/2014     FAMILY HISTORY   Family History  Problem Relation Age of Onset  . Heart murmur Mother     SOCIAL HISTORY:   Social History   Social History  . Marital status: Single    Spouse name: N/A  . Number of children: N/A  . Years of education: N/A   Occupational History  . Not on file.   Social History Main Topics  . Smoking status: Former Smoker    Years: 35.00    Types: Cigarettes, Cigars    Quit date: 10/07/2014  . Smokeless tobacco: Never Used  . Alcohol use No  . Drug use: No  . Sexual activity: Not on file   Other Topics Concern  . Not on file   Social History Narrative  . No narrative on file    ALLERGIES:    No Known Allergies  CURRENT MEDICATIONS:    Current Outpatient Prescriptions    Medication Sig Dispense Refill  . aspirin EC 325 MG EC tablet Take 1 tablet (325 mg total) by mouth daily.    . metoprolol tartrate (LOPRESSOR) 25 MG tablet take 1 tablet by mouth twice a day 180 tablet 0  . oxyCODONE-acetaminophen (PERCOCET) 10-325 MG tablet Take 1 tablet by mouth every 8 (eight) hours as needed for pain.     No current facility-administered medications for this visit.     REVIEW OF SYSTEMS:   [X]  denotes positive finding, [ ]  denotes negative finding Cardiac  Comments:  Chest pain or chest pressure:    Shortness of breath upon exertion:    Short of breath when lying flat:    Irregular heart rhythm:        Vascular    Pain in calf, thigh, or hip brought on by ambulation:    Pain in feet at night that wakes you up from your sleep:     Blood clot in your veins:    Leg swelling:         Pulmonary    Oxygen at home:    Productive cough:     Wheezing:         Neurologic    Sudden weakness in arms or legs:     Sudden numbness in arms or legs:  Sudden onset of difficulty speaking or slurred speech:    Temporary loss of vision in one eye:     Problems with dizziness:         Gastrointestinal    Blood in stool:      Vomited blood:         Genitourinary    Burning when urinating:     Blood in urine:        Psychiatric    Major depression:         Hematologic    Bleeding problems:    Problems with blood clotting too easily:        Skin    Rashes or ulcers:        Constitutional    Fever or chills:     PHYSICAL EXAM:   There were no vitals filed for this visit.  GENERAL: The patient is a well-nourished male, in no acute distress. The vital signs are documented above. CARDIAC: There is a regular rate and rhythm.  VASCULAR: Palpable pedal and radial pulses bilaterally PULMONARY: Nonlabored respirations ABDOMEN:   Large indirect inguinal hernia with the inability to reduce. MUSCULOSKELETAL: There are no major deformities or  cyanosis. NEUROLOGIC: No focal weakness or paresthesias are detected. SKIN: There are no ulcers or rashes noted. PSYCHIATRIC: The patient has a normal affect.  STUDIES:   I have reviewed his CTA with the following findings: 1. Slight interval remodeling of the distal extent of the abdominal aortic dissection. Previously, the dissection flap extended more inferiorly as a thrombosed false lumen/intramural hematoma. The intramural hematoma component has resorbed over time and there is less overall dilatation of the distal abdominal aorta in the region of the inferior mesenteric artery. Otherwise, stable dissection flap and ectasia of the super renal and juxtarenal abdominal aorta. No progression of aneurysmal dilatation or propagation of the dissection flap. 2. Enlarging intermediate attenuation lesion within the lower pole of the right kidney. This remains incompletely evaluated having not been included on prior noncontrast CT images. While it is possible that this represents a slowly enlarging complex cyst, a low grade renal neoplasm such as a papillary cell renal carcinoma is also a distinct possibility. Recommend further evaluation with renal protocol MRI of the abdomen with gadolinium contrast. 3. Persistent differential enhancement of the kidneys with relatively decreased enhancement of the right kidney (the right renal artery arises from the false lumen). No evidence of renal cortical atrophy. 4. Stable appearance of large left inguinal hernia containing multiple loops of small bowel and associated mesentery. 5. Additional ancillary findings as above without significant interval change.  ASSESSMENT and PLAN  Thoracic aortic aneurysm following type I dissection: I discussed with the patient proceeding with aortic arch reconstruction and stent graft placement to treat his thoracic aneurysm.  We discussed the risk of major complication including the risk of paralysis, death,  cardiopulmonary complications, renal failure, intestinal ischemia, and lower extremity arterial complications..  We also discussed the possibility of future intervention should the false lumen not thrombosed.  He understands this and wants to proceed.  I will coordinate this with Dr. Tyrone Sage.  I am trying to get carotid Doppler studies prior to his operation.  In addition we will see assistance from financial counseling regarding disability versus Medicaid.   Durene Cal, MD [facility] [phone] Pager [phone]

## 2016-12-23 ENCOUNTER — Other Ambulatory Visit: Payer: Self-pay | Admitting: *Deleted

## 2016-12-23 ENCOUNTER — Other Ambulatory Visit: Payer: Self-pay

## 2016-12-23 ENCOUNTER — Ambulatory Visit (HOSPITAL_COMMUNITY): Payer: Self-pay | Attending: Cardiovascular Disease

## 2016-12-23 ENCOUNTER — Encounter: Payer: Self-pay | Admitting: Internal Medicine

## 2016-12-23 ENCOUNTER — Ambulatory Visit (INDEPENDENT_AMBULATORY_CARE_PROVIDER_SITE_OTHER): Payer: Self-pay | Admitting: Internal Medicine

## 2016-12-23 VITALS — BP 140/93 | HR 94 | Ht 67.0 in | Wt 168.0 lb

## 2016-12-23 DIAGNOSIS — Z79899 Other long term (current) drug therapy: Secondary | ICD-10-CM

## 2016-12-23 DIAGNOSIS — I2510 Atherosclerotic heart disease of native coronary artery without angina pectoris: Secondary | ICD-10-CM

## 2016-12-23 DIAGNOSIS — I712 Thoracic aortic aneurysm, without rupture, unspecified: Secondary | ICD-10-CM

## 2016-12-23 DIAGNOSIS — Z01810 Encounter for preprocedural cardiovascular examination: Secondary | ICD-10-CM | POA: Insufficient documentation

## 2016-12-23 DIAGNOSIS — I2584 Coronary atherosclerosis due to calcified coronary lesion: Secondary | ICD-10-CM

## 2016-12-23 DIAGNOSIS — I5030 Unspecified diastolic (congestive) heart failure: Secondary | ICD-10-CM | POA: Insufficient documentation

## 2016-12-23 DIAGNOSIS — I351 Nonrheumatic aortic (valve) insufficiency: Secondary | ICD-10-CM

## 2016-12-23 DIAGNOSIS — Z13220 Encounter for screening for lipoid disorders: Secondary | ICD-10-CM

## 2016-12-23 DIAGNOSIS — Z9889 Other specified postprocedural states: Secondary | ICD-10-CM

## 2016-12-23 LAB — COMPREHENSIVE METABOLIC PANEL
ALT: 11 IU/L (ref 0–44)
AST: 13 IU/L (ref 0–40)
Albumin/Globulin Ratio: 1.7 (ref 1.2–2.2)
Albumin: 4.3 g/dL (ref 3.6–4.8)
Alkaline Phosphatase: 91 IU/L (ref 39–117)
BUN/Creatinine Ratio: 11 (ref 10–24)
BUN: 15 mg/dL (ref 8–27)
Bilirubin Total: 1.1 mg/dL (ref 0.0–1.2)
CALCIUM: 9.4 mg/dL (ref 8.6–10.2)
CO2: 24 mmol/L (ref 20–29)
CREATININE: 1.34 mg/dL — AB (ref 0.76–1.27)
Chloride: 102 mmol/L (ref 96–106)
GFR calc Af Amer: 66 mL/min/{1.73_m2} (ref >59)
GFR, EST NON AFRICAN AMERICAN: 57 mL/min/{1.73_m2} — AB (ref >59)
GLUCOSE: 85 mg/dL (ref 65–99)
Globulin, Total: 2.6 g/dL (ref 1.5–4.5)
Potassium: 5.2 mmol/L (ref 3.5–5.2)
SODIUM: 141 mmol/L (ref 134–144)
Total Protein: 6.9 g/dL (ref 6.0–8.5)

## 2016-12-23 LAB — ECHOCARDIOGRAM COMPLETE
Height: 67 in
Weight: 2688 oz

## 2016-12-23 LAB — LIPID PANEL
CHOL/HDL RATIO: 2.8 ratio (ref 0.0–5.0)
CHOLESTEROL TOTAL: 121 mg/dL (ref 100–199)
HDL: 43 mg/dL (ref >39)
LDL CALC: 67 mg/dL (ref 0–99)
TRIGLYCERIDES: 57 mg/dL (ref 0–149)
VLDL CHOLESTEROL CAL: 11 mg/dL (ref 5–40)

## 2016-12-23 NOTE — Progress Notes (Signed)
Grossly normal   OFFICE NOTE  Chief Complaint:  Follow-up thoracic aortic aneurysm  Primary Care Physician: [name], Wayland, MD  HPI:  Victor [name] is a 61 y.o. male with a history of tobacco abuse, polysubstance abuse and recent thoracic aortic dissection. No family history of coronary artery disease. He was admitted from June 12 of 10/14/2014 and underwent repair of the ascending aortic dissection with replacement of the ascending aorta and suspension of the aortic valve with cardiopulmonary bypass. TEE during surgery revealed an ejection fraction of 60-65%.He was recently seen by Bryan Hager, PA-C-C and follow-up. He was noted to not be taking his beta blocker at the time. He says he may never have had this prescription filled. Otherwise he is asymptomatic. He denies any chest pain or shortness of breath. He was on iron in the hospital for anemia and is questioning whether he needs to be on this today or not. He reports he has stopped smoking which I've encouraged him to maintain.  09/23/2015  Victor [name] returns today for follow-up. He was recently seen by Dr. Ed Gerhardt, who noted that his blood pressure was elevated. He increased his metoprolol to 25 mg twice a day. Today's blood pressure is well-controlled at 122/74. He denies any side effects from the increased dose of Lopressor. He is on aspirin 325 mg daily. EKG shows normal sinus rhythm at 91 with biatrial enlargement. He denies any chest pain or worsening shortness of breath. He is physically active. He has been having problems apparently with a hernia and is requesting a referral for primary care physician.  12/23/2016  Victor [name] returns today for follow-up of the thoracic aortic aneurysm. This previously described he data type I dissection in 2015 and had repair along with replacement of ascending aorta and resuspension of the aortic valve. Subsequently he's had progressive enlargement of thoracic aorta now measuring 5.6 cm.  He also has an unrepaired inguinal hernia. He was referred for preoperative stress evaluation and underwent Lexiscan Myoview which was negative for ischemia and showed normal LV function. He scheduled to have an echocardiogram today to assess he is aortic valve and degree of aortic insufficiency. He denies any chest pain or worsening shortness of breath. He's currently only on aspirin and metoprolol. He reports he is not taking his metoprolol for the last 3 days because he was not eating breakfast. I_the critical importance of taking that medicine and the fact he could take it without eating. Blood pressure is slightly elevated today 140/93 but his heart rate is in the 90s. This is not ideal given his aneurysm. He also has not had a lipid assessment since 2016. At the time LDL-C was 63 and not on medications. I'll plan to reassess that today as well as a metabolic profile.  PMHx:  Past Medical History:  Diagnosis Date  . Thoracic aortic aneurysm (HCC) 09/2014    Past Surgical History:  Procedure Laterality Date  . FEMORAL-FEMORAL BYPASS GRAFT N/A 10/07/2014   Procedure: LEFT  FEMORAL ARTERY -RIGHT FEMORAL ARTERY BYPASS GRAFT USING 8MM X 30 CM HEMASHIELD GOLD GRAFT;  Surgeon: Todd F Early, MD;  Location: [facility] OR;  Service: Vascular;  Laterality: N/A;  . THORACIC AORTIC ANEURYSM REPAIR N/A 10/06/2014   Procedure: THORACIC ASCENDING ANEURYSM REPAIR (AAA);  Surgeon: Edward B Gerhardt, MD;  Location: [facility] OR;  Service: Open Heart Surgery;  Laterality: N/A;  hyportermia circulatory arrest and resuspension of aortic valve    FAMHx:  Family History  Problem Relation Age   of Onset  . Heart murmur Mother     SOCHx:   reports that he quit smoking about 2 years ago. His smoking use included Cigarettes and Cigars. He quit after 35.00 years of use. He has never used smokeless tobacco. He reports that he does not drink alcohol or use drugs.  ALLERGIES:  No Known Allergies  ROS: Pertinent items noted in HPI and  remainder of comprehensive ROS otherwise negative.  HOME MEDS: Current Outpatient Prescriptions  Medication Sig Dispense Refill  . aspirin EC 325 MG EC tablet Take 1 tablet (325 mg total) by mouth daily.    . metoprolol tartrate (LOPRESSOR) 25 MG tablet take 1 tablet by mouth twice a day 180 tablet 0  . oxyCODONE-acetaminophen (PERCOCET) 10-325 MG tablet Take 1 tablet by mouth every 8 (eight) hours as needed for pain.     No current facility-administered medications for this visit.     LABS/IMAGING: No results found for this or any previous visit (from the past 48 hour(s)). No results found.  WEIGHTS: Wt Readings from Last 3 Encounters:  12/23/16 168 lb (76.2 kg)  12/20/16 165 lb 14.4 oz (75.3 kg)  12/15/16 167 lb (75.8 kg)    VITALS: BP (!) 140/93   Pulse 94   Ht 5\' 7"  (1.702 m)   Wt 168 lb (76.2 kg)   SpO2 98%   BMI 26.31 kg/m   EXAM: General appearance: alert and no distress Neck: no carotid bruit and no JVD Lungs: clear to auscultation bilaterally Heart: regular rate and rhythm, diastolic murmur: early diastolic 2/6, crescendo at 2nd right intercostal space and Prominent aortic pulsation in the anterior chest Abdomen: soft, non-tender; bowel sounds normal; no masses,  no organomegaly Extremities: extremities normal, atraumatic, no cyanosis or edema Pulses: 2+ and symmetric Skin: Skin color, texture, turgor normal. No rashes or lesions Neurologic: Grossly normal Psych: Pleasant  EKG: Deferred  ASSESSMENT: 1. Thoracic aortic dissection s/p repair by Dr. Tyrone SageGerhardt in 2016, now with 5.6 cm thoracic aortic aneurysm 2. Aortic insufficiency 3. Ischemic leg 4. Iron deficiency anemia 5. Essential hypertension 6. Large direct inguinal hernia, unrepaired 7. Acceptable risk for upcoming surgery  PLAN: 1.   Victor [name] had a Lexiscan Myoview which was negative for ischemia. He'll undergo an echo today. This was to assess his degree of aortic insufficiency. That is not a  loud murmur on exam today. He denies any worsening shortness of breath and walk up a flight of stairs. He has no chest pain. He should be acceptable risk for upcoming surgery. We'll go and check a repeat lipid profile is not on a statin. His last LDL was in the 60s in 2016. He also has not taken his metoprolol for the past 3 days and I underscored the importance of compliance with that medication. His heart rate was in the 90s and blood pressure was elevated today. He said he was to take the medicine when he gets home.   Follow-up with me in 6 months after surgeries.   Chrystie NoseKenneth C. Sotiria Keast, MD, [facility]ACC Attending Cardiologist [facility]  Victor [name] 12/23/2016, 8:09 AM

## 2016-12-23 NOTE — Patient Instructions (Signed)
Your physician recommends that you return for lab work FASTING - lipid, CMET  Your physician wants you to follow-up in: 6 months with Dr. Rennis GoldenHilty. You will receive a reminder letter in the mail two months in advance. If you don't receive a letter, please call our office to schedule the follow-up appointment.

## 2016-12-28 NOTE — Addendum Note (Signed)
Addended by: Burton ApleyPETTY, Moosa Bueche A on: 12/28/2016 03:58 PM   Modules accepted: Orders

## 2016-12-29 ENCOUNTER — Encounter: Payer: Self-pay | Admitting: Internal Medicine

## 2016-12-31 ENCOUNTER — Ambulatory Visit (HOSPITAL_COMMUNITY)
Admission: RE | Admit: 2016-12-31 | Discharge: 2016-12-31 | Disposition: A | Discharge status: Home / Self Care | Payer: Medicaid Other | Source: Ambulatory Visit | Attending: Vascular Surgery | Admitting: Vascular Surgery

## 2016-12-31 DIAGNOSIS — I6529 Occlusion and stenosis of unspecified carotid artery: Secondary | ICD-10-CM | POA: Diagnosis present

## 2016-12-31 DIAGNOSIS — I712 Thoracic aortic aneurysm, without rupture, unspecified: Secondary | ICD-10-CM

## 2016-12-31 LAB — VAS US CAROTID
LCCADSYS: -114 cm/s
LEFT ECA DIAS: -12 cm/s
LEFT VERTEBRAL DIAS: -15 cm/s
LICAPDIAS: -12 cm/s
Left CCA dist dias: -20 cm/s
Left CCA prox dias: 22 cm/s
Left CCA prox sys: 139 cm/s
Left ICA dist dias: -22 cm/s
Left ICA dist sys: -73 cm/s
Left ICA prox sys: -63 cm/s
RCCAPDIAS: 24 cm/s
RIGHT CCA MID DIAS: -27 cm/s
RIGHT ECA DIAS: -20 cm/s
RIGHT VERTEBRAL DIAS: -9 cm/s
Right CCA prox sys: 130 cm/s
Right cca dist sys: -74 cm/s

## 2017-01-03 ENCOUNTER — Inpatient Hospital Stay: Admission: RE | Admit: 2017-01-03 | Payer: Self-pay | Source: Ambulatory Visit

## 2017-01-05 ENCOUNTER — Ambulatory Visit
Admission: RE | Admit: 2017-01-05 | Discharge: 2017-01-05 | Disposition: A | Discharge status: Home / Self Care | Payer: Self-pay | Source: Ambulatory Visit | Attending: Cardiothoracic Surgery | Admitting: Cardiothoracic Surgery

## 2017-01-05 DIAGNOSIS — N2889 Other specified disorders of kidney and ureter: Secondary | ICD-10-CM

## 2017-01-05 MED ORDER — GADOBENATE DIMEGLUMINE 529 MG/ML IV SOLN
15.0000 mL | Freq: Once | INTRAVENOUS | Status: AC | PRN
  Administered 2017-01-05: 15 mL via INTRAVENOUS

## 2017-01-06 ENCOUNTER — Encounter: Payer: Self-pay | Admitting: Cardiothoracic Surgery

## 2017-01-06 ENCOUNTER — Other Ambulatory Visit: Payer: Self-pay | Admitting: *Deleted

## 2017-01-06 ENCOUNTER — Ambulatory Visit (INDEPENDENT_AMBULATORY_CARE_PROVIDER_SITE_OTHER): Payer: Self-pay | Admitting: Cardiothoracic Surgery

## 2017-01-06 VITALS — BP 130/88 | HR 80 | Resp 16 | Ht 67.0 in | Wt 168.0 lb

## 2017-01-06 DIAGNOSIS — I7101 Dissection of thoracic aorta: Secondary | ICD-10-CM

## 2017-01-06 DIAGNOSIS — I712 Thoracic aortic aneurysm, without rupture, unspecified: Secondary | ICD-10-CM

## 2017-01-06 DIAGNOSIS — I71019 Dissection of thoracic aorta, unspecified: Secondary | ICD-10-CM

## 2017-01-06 DIAGNOSIS — Z09 Encounter for follow-up examination after completed treatment for conditions other than malignant neoplasm: Secondary | ICD-10-CM

## 2017-01-06 NOTE — Progress Notes (Signed)
[address]       [place] [address]             [phone]                    ARBEN PACKMAN [facility] Medical Record #[id number] Date of Birth: October 26, 1955  Referring: Billee Cashing, MD Primary Care: Billee Cashing, MD  Chief Complaint:    Chief Complaint  Patient presents with  . Follow-up    after CARDIOLOGY CL/STREES/TEST 12/15/16, ECHO 12/23/16, MRI ABD 01/05/17 and apt. with Dr. Myra Gianotti   10/06/2014 OPERATIVE REPORT PREOPERATIVE DIAGNOSIS: Acute type 1 aortic dissection originating just at the takeoff of the right coronary ostium extending into the right external iliac artery. 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 SURGICAL PROCEDURE: Repair of ascending aortic dissection with replacement of ascending aorta and resuspension of the aortic valve with cardiopulmonary bypass and hypothermic circulatory arrest. SURGEON: Sheliah Plane, MD  History of Present Illness:    MURICE BARBAR 61 y.o. male is seen in the office  today for follow-up after repair of acute type I aortic dissection and subsequent need for femorofemoral bypass because of ischemic right leg in the early postoperative period. Since surgery the patient has stopped smoking. He has not had his large inguinal hernia repaired yet.    Since last seen he has consulted with vascular surgery about a combined the branching of the aortic arch and placement of thoracic aortic stent graft. Cardiac stress test has been done and is low risk.  Current Activity/ Functional Status:  Patient is independent with mobility/ambulation, transfers, ADL's, IADL's.   Zubrod Score: At the time of surgery this patient's most appropriate activity status/level should be described as: []     0    Normal activity, no symptoms [x]     1    Restricted in physical strenuous  activity but ambulatory, able to do out light work []     2    Ambulatory and capable of self care, unable to do work activities, up and about               >50 % of waking hours                              []     3    Only limited self care, in bed greater than 50% of waking hours []     4    Completely disabled, no self care, confined to bed or chair []     5    Moribund   Past Medical History:  Diagnosis Date  . Thoracic aortic aneurysm (HCC) 09/2014    Past Surgical History:  Procedure Laterality Date  . FEMORAL-FEMORAL BYPASS GRAFT N/A 10/07/2014   Procedure: LEFT  FEMORAL ARTERY -RIGHT FEMORAL ARTERY BYPASS GRAFT USING X 30 CM HEMASHIELD GOLD GRAFT;  Surgeon: Larina Earthly, MD;  Location: [facility] OR;  Service: Vascular;  Laterality: N/A;  . THORACIC AORTIC ANEURYSM REPAIR N/A 10/06/2014   Procedure: THORACIC ASCENDING ANEURYSM REPAIR (AAA);  Surgeon: [name], MD;  Location: [facility] OR;  Service: Open Heart Surgery;  Laterality: N/A;  hyportermia circulatory arrest and resuspension of aortic valve    Family History  Problem Relation Age of Onset  . Heart murmur Mother  Social History   Social History  . Marital status: Single    Spouse name: N/A  . Number of children: N/A  . Years of education: N/A   Occupational History  . Not on file.   Social History Main Topics  . Smoking status: Former Smoker    Years: 35.00    Types: Cigarettes, Cigars    Quit date: 10/07/2014  . Smokeless tobacco: Never Used  . Alcohol use No  . Drug use: No  . Sexual activity: Not on file   Other Topics Concern  . Not on file   Social History Narrative  . No narrative on file    History  Smoking Status  . Former Smoker  . Years: 35.00  . Types: Cigarettes, Cigars  . Quit date: 10/07/2014  Smokeless Tobacco  . Never Used    History  Alcohol Use No     No Known Allergies  Current Outpatient Prescriptions  Medication Sig Dispense Refill  . aspirin EC 325 MG EC tablet Take  1 tablet (325 mg total) by mouth daily.    . metoprolol tartrate (LOPRESSOR) 25 MG tablet take 1 tablet by mouth twice a day 180 tablet 0  . oxyCODONE-acetaminophen (PERCOCET) 10-325 MG tablet Take 1 tablet by mouth every 8 (eight) hours as needed for pain.     No current facility-administered medications for this visit.       Review of Systems:     Cardiac Review of Systems: Y or N  Chest Pain [  n  ]  Resting SOB [  n ] Exertional SOB  [n Orthopnea [  n]   Pedal Edema [n   ]    Palpitations Milo.Brash  ] Syncope  [ n ]   Presyncope [ n  ]  General Review of Systems: [Y] = yes [  ]=no Constitional: recent weight change [  ];  Wt loss over the last 3 months [   ] anorexia [  ]; fatigue [n]; nausea [  ]; night sweats [  ]; fever [  ]; or chills [  ];          Dental: poor dentition[  ]; Last Dentist visit:   Eye : blurred vision [n  ]; diplopia Milo.Brash   ]; vision changes [ n ];  Amaurosis fugax[n  ]; Resp: cough [  ];  wheezing[  ];  hemoptysis[  ]; shortness of breath[  ]; paroxysmal nocturnal dyspnea[ n ]; dyspnea on exertion[ n ]; or orthopnea[  ];  GI:  gallstones[  ], vomiting[  ];  dysphagia[  ]; melena[  ];  hematochezia [  ]; heartburn[  ];   Hx of  Colonoscopy[  ]; GU: kidney stones [  ]; hematuria[ n ];   dysuria [ n ];  nocturia[  ];  history of     obstruction [  ]; urinary frequency [  ]             Skin: rash, swelling[  ];, hair loss[  ];  peripheral edema[  ];  or itching[  ]; Musculosketetal: myalgias[ n ];  joint swelling[ n ];  joint erythema[n  ];  joint pain[  ];  back pain[n  ];  Heme/Lymph: bruising[  ];  bleeding[  ];  anemia[  ];  Neuro: TIA[  ];  headaches[  ];  stroke[  ];  vertigo[  ];  seizures[  ];   paresthesias[n  ];  difficulty walking[ n ];  Psych:depression[  ]; anxiety[  ];  Endocrine: diabetes[  ];  thyroid dysfunction[  ];  Immunizations: Flu up to date [ n ]; Pneumococcal up to date Milo.Brash  ];  Other:  Physical Exam: BP 130/88 (BP Location: Right Arm, Patient  Position: Sitting, Cuff Size: Large)   Pulse 80   Resp 16   Ht  (1.702 m)   Wt 168 lb (76.2 kg)   SpO2 99% Comment: ON RA  BMI 26.31 kg/m   PHYSICAL EXAMINATION: Physical Exam  Constitutional: He is oriented to person, place, and time. No distress.  HENT:  Mouth/Throat: No oropharyngeal exudate.  Neck: No JVD present. No tracheal deviation present. No thyromegaly present.  Cardiovascular: Normal rate, regular rhythm, normal heart sounds and intact distal pulses.  Exam reveals no gallop and no friction rub.   No murmur heard. Respiratory: No stridor. No respiratory distress. He has no wheezes. He has no rales. He exhibits no tenderness.  GI: Soft. Bowel sounds are normal. He exhibits mass. He exhibits no distension. There is no tenderness. There is no rebound and no guarding.  Musculoskeletal: He exhibits no edema, tenderness or deformity.  Lymphadenopathy:    He has no cervical adenopathy.  Neurological: He is alert and oriented to person, place, and time.  Skin: Skin is warm and dry. He is not diaphoretic.  Psychiatric: He has a normal mood and affect. His behavior is normal. Judgment and thought content normal.  Large left inguinal hernia, palpable 1+ DP and PT pulses at the right ankle 2+ DP and PT pulses at the left ankle, he has bilateral femoral pulses but no palpable pulse in the femorofemoral crossover graft    Diagnostic Studies & Laboratory data:     Recent Radiology Findings:  Mr Abdomen Wwo Contrast  Result Date: 01/06/2017 CLINICAL DATA:  Renal lesion on recent CT scan. EXAM: MRI ABDOMEN WITHOUT AND WITH CONTRAST TECHNIQUE: Multiplanar multisequence MR imaging of the abdomen was performed both before and after the administration of intravenous contrast. CONTRAST:  15mL MULTIHANCE GADOBENATE DIMEGLUMINE 529 MG/ML IV SOLN COMPARISON:  CTA from 12/09/2016 FINDINGS: Lower chest:  No evidence for pleural effusion. Hepatobiliary: Liver unremarkable although incompletely  evaluated on this dedicated renal study. There is no evidence for gallstones, gallbladder wall thickening, or pericholecystic fluid. No intrahepatic or extrahepatic biliary dilation. Pancreas: No focal mass lesion. No dilatation of the main duct. No intraparenchymal cyst. No peripancreatic edema. Spleen: Visualized portions of the spleen are unremarkable. Adrenals/Urinary Tract: No adrenal nodule or mass. Right renal lesion of concern on prior CTA is identified towards the lower pole the right kidney. This 2.0 x 1.8 cm T1 hyperintense, T2 variable intensity, partially septated lesion shows layering debris or hemorrhage. No substantial enhancement within the lesion although the most delayed postcontrast subtraction imaging suggests possible small enhancing nodules (image 34 series 19 and image 37 series 19). Scattered small simple cysts are identified in the left kidney. No enhancing left renal mass. Stomach/Bowel: Stomach is nondistended. No gastric wall thickening. No evidence of outlet obstruction. Duodenum is normally positioned as is the ligament of Treitz. No small bowel or colonic dilatation within the visualized abdomen. Vascular/Lymphatic: Thoracolumbar aortic dissection better characterized on the recent CTA exam. Other: No intraperitoneal free fluid. Musculoskeletal: No abnormal marrow signal within the visualized bony anatomy. IMPRESSION: 1. 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 2. Small simple cysts (Bosniak I) in the left kidney. 3. Thoracoabdominal aortic dissection as  better characterized on recent CTA. Electronically Signed   By: Kennith Center M.D.   On: 01/06/2017 08:44    Result Date: 12/09/2016 CLINICAL DATA:  61 year old male with chronic aortic dissection. EXAM: CT ANGIOGRAPHY CHEST, ABDOMEN AND PELVIS TECHNIQUE: Multidetector CT imaging through the chest, abdomen and pelvis was performed using the standard protocol during bolus administration  of intravenous contrast. Multiplanar reconstructed images and MIPs were obtained and reviewed to evaluate the vascular anatomy. Creatinine was obtained on site at [facility] at [address] Results: Creatinine 1.2 mg/dL. CONTRAST:  75 mL Isovue 370 COMPARISON:  Most recent prior chest CTA 05/27/2016 ; most recent prior CT scan of the chest, abdomen and pelvis 04/10/2015 FINDINGS: CTA CHEST FINDINGS Cardiovascular: Surgical changes of prior operative repair of the ascending portion of the known Stanford a thoracic aortic aneurysm. The tube graft remains unchanged in appearance. The dissection flap begins immediately beyond the distal anastomosis and extends throughout the thoracic aorta and into the abdominal aorta. Progressive aneurysmal dilatation of the aortic infundibulum now measuring 5.6 cm compared to 5.3 cm previously (confirmed on coronal reformatted images). The aneurysmal descending thoracic aorta remains unchanged in caliber measuring 5.5 cm at the level of the origin of the main pulmonary artery and 4.5 cm just proximal to the aortic hiatus. The arch vessels remain tortuous. The dissection flap does not extend into the vessels. Main pulmonary artery remains normal in size. The heart remains normal in size. No pericardial effusion. Mediastinum/Nodes: Similar appearance of heterogeneous multinodular thyroid gland. Mildly prominent right para-aortic lymph nodes again noted without significant change compared to 2016. These are likely reactive. No new adenopathy or mediastinal mass. Unremarkable thoracic esophagus. Lungs/Pleura: Paraseptal pulmonary emphysema with bullous change at the right lung apex. No evidence of pleural effusion, focal airspace consolidation, pulmonary edema or pneumothorax. Respiratory motion artifact noted in the lower lungs bilaterally. No suspicious nodule or mass. Musculoskeletal: No acute fracture or aggressive appearing lytic or blastic osseous lesion. Healed  median sternotomy. Review of the MIP images confirms the above findings. CTA ABDOMEN AND PELVIS FINDINGS VASCULAR Aorta: Chronic dissection of the the abdominal aorta again noted. The dissection flap extends into the infrarenal abdominal aorta and terminates proximal to the inferior mesenteric artery. There has been some remodeling of the intramural hematoma in the distal aorta compared to December of 2016. The visceral aorta remains mildly aneurysmal at 4.7 cm which is unchanged compared to prior. The remainder of the aorta remains within normal limits in size. Celiac: Fenestration of the dissection flap at the origin of the celiac artery. Thus, the celiac artery arises from both the true and false lumen. Widely patent. No aneurysm. SMA: Arises from the true lumen. Widely patent. No aneurysm or dissection. Renals: Fenestration at the level of the renal arteries providing cross flow. The right renal artery arises from the false lumen while the left renal artery arises from the true lumen. The renal arteries are normal in caliber. No evidence of dissection or fibromuscular dysplasia. IMA: Patent and unremarkable. Inflow: Relatively spared from disease.  Widely patent. Outflow: Ectasia of the bilateral common femoral artery is likely at sites of prior endarterectomy. There is a completely excluded fem-fem bypass graft. The visualized portions of the superficial and profunda femoral arteries are widely patent. Veins: No obvious venous abnormality within the limitations of this arterial phase study. Review of the MIP images confirms the above findings. NON-VASCULAR Hepatobiliary: Normal hepatic contour and morphology. No discrete hepatic lesions. Normal appearance of  the gallbladder. No intra or extrahepatic biliary ductal dilatation. Pancreas: Unremarkable. No pancreatic ductal dilatation or surrounding inflammatory changes. Spleen: Normal in size without focal abnormality. Adrenals/Urinary Tract: Normal adrenal  glands. Differential enhancement. There is relatively decreased enhancement of the right renal parenchyma. No evidence of hydronephrosis or nephrolithiasis. 2.2 cm intermediate attenuation in the lower pole of the right kidney has slowly grown in size compared to the initial CT scan from June of 2016 at which time it measured 1.6 cm. Small low-attenuation cyst exophytic from the upper pole of the left kidney remains unchanged and is likely a simple cyst. Unremarkable ureters and bladder. Stomach/Bowel: No focal bowel wall thickening or evidence of obstruction. Large left inguinal hernia containing multiple loops of small bowel and mesenteric. Normal appendix in the right lower quadrant. Lymphatic: No suspicious lymphadenopathy. Reproductive: Prostate is unremarkable. Other: Large left inguinal hernia.  Negative for ascites. Musculoskeletal: No acute fracture or aggressive appearing lytic or blastic osseous lesion. Right-sided facet arthropathy at L5-S1. Review of the MIP images confirms the above findings. IMPRESSION: CTA CHEST 1. Enlarging chronic aneurysmal thoracic aortic dissection. The aortic infundibulum now measures 5.6 cm compared to 5.3 cm on 05/27/2016 and 4.1 cm at the time of presentation in June of 2016. No evidence of propagation of the dissection flap. 2. Prior surgical repair of Stanford type A ascending thoracic aortic aneurysm with stable appearance of the ascending aortic tube graft. 3. Stable chronic aneurysmal dissection of the descending thoracic aorta. 4. Paraseptal emphysema with bullous change in the right lung apex. CTA ABD/PELVIS 1. Slight interval remodeling of the distal extent of the abdominal aortic dissection. Previously, the dissection flap extended more inferiorly as a thrombosed false lumen/intramural hematoma. The intramural hematoma component has resorbed over time and there is less overall dilatation of the distal abdominal aorta in the region of the inferior mesenteric artery.  Otherwise, stable dissection flap and ectasia of the super renal and juxtarenal abdominal aorta. No progression of aneurysmal dilatation or propagation of the dissection flap. 2. Enlarging intermediate attenuation lesion within the lower pole of the right kidney. This remains incompletely evaluated having not been included on prior noncontrast CT images. While it is possible that this represents a slowly enlarging complex cyst, a low grade renal neoplasm such as a papillary cell renal carcinoma is also a distinct possibility. Recommend further evaluation with renal protocol MRI of the abdomen with gadolinium contrast. 3. Persistent differential enhancement of the kidneys with relatively decreased enhancement of the right kidney (the right renal artery arises from the false lumen). No evidence of renal cortical atrophy. 4. Stable appearance of large left inguinal hernia containing multiple loops of small bowel and associated mesentery. 5. Additional ancillary findings as above without significant interval change. These results were called by telephone at the time of interpretation on 12/09/2016 at 12:14 pm to Dr. Sheliah Plane , who verbally acknowledged these results. Signed, Sterling Big, MD [facility] Electronically Signed   By: Malachy Moan M.D.   On: 12/09/2016 12:14   I have independently reviewed the above radiology studies  and reviewed the findings with the patient.   Ct Angio Chest Aorta W/cm &/or Wo/cm  Result Date: 05/27/2016 CLINICAL DATA:  61 year old male with history of thoracic aortic dissection status post surgical repair in February 2016. Followup study. EXAM: CT ANGIOGRAPHY CHEST WITH CONTRAST TECHNIQUE: Multidetector CT imaging of the chest was performed using the standard protocol during bolus administration of intravenous contrast. Multiplanar  CT image reconstructions and MIPs were obtained to evaluate the vascular  anatomy. CONTRAST:  100 mL of Isovue 370. COMPARISON:  Chest CT 08/28/2015. FINDINGS: Cardiovascular: Heart size is normal. There is no significant pericardial fluid, thickening or pericardial calcification. There is aortic atherosclerosis, as well as atherosclerosis of the great vessels of the mediastinum and the coronary arteries, including calcified atherosclerotic plaque in the left anterior descending and right coronary arteries. Status post median sternotomy for graft replacement of the ascending thoracic aorta. The proximal and distal anastomoses of the graft are intact. No abnormal fluid collection around the graft. Immediately distal to the distal anastomosis there is an aortic dissection, which extends into the abdominal aorta (below the lower margin of the images). The dissection flap does not extend into the great vessels. The dissection flap over rides the origin of the celiac axis which appears to be fed by the false lumen. Aneurysmal dilatation of the distal aortic arch which measures up to 5.3 cm in diameter. There is also aneurysmal dilatation of the descending thoracic aorta which measures up to 4.2 x 4.7 cm (mean diameter of 4.5 cm). On the precontrast images there is no crescentic high attenuation associated with the wall of the thoracic aorta to suggest acute intramural hemorrhage. Mediastinum/Nodes: Multiple borderline enlarged and mildly enlarged mediastinal lymph nodes measuring up to 1 cm in short axis in the right paratracheal nodal stations, similar to the prior study. Esophagus is unremarkable in appearance. No axillary lymphadenopathy. Lungs/Pleura: New 3 x 5 mm (mean diameter 4 mm) pulmonary nodule in the right middle lobe (image 103 of series 14). No other larger more suspicious appearing pulmonary nodules or masses are noted. Scattered areas of mild linear scarring are noted throughout the lung bases. No acute consolidative airspace disease. No pleural effusions. Moderate paraseptal  emphysema most notable in the lung apices. Upper Abdomen: Unremarkable. Musculoskeletal: Median sternotomy wires. There are no aggressive appearing lytic or blastic lesions noted in the visualized portions of the skeleton. Review of the MIP images confirms the above findings. IMPRESSION: 1. Chronic type A thoracic aortic dissection status post graft repair of the ascending thoracic aorta, with persistent aneurysmal dilatation of the distal aortic arch (5.3 cm in diameter) and the descending thoracic aorta (mean diameter 4.5 cm), as above. No acute findings. 2. 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 3. Moderate paraseptal emphysema. 4.  2 vessel coronary artery disease. Electronically Signed   By: Trudie Reed M.D.   On: 05/27/2016 12:21   Ct Angio Chest Aorta W/cm &/or Wo/cm  08/28/2015  CLINICAL DATA:  Followup thoracic aneurysm repair EXAM: CT ANGIOGRAPHY CHEST WITH CONTRAST TECHNIQUE: Multidetector CT imaging of the chest was performed using the standard protocol during bolus administration of intravenous contrast. Multiplanar CT image reconstructions and MIPs were obtained to evaluate the vascular anatomy. CONTRAST:  75 mL Isovue 370 COMPARISON:  04/10/2015 FINDINGS: Lungs are well aerated bilaterally. Diffuse emphysematous changes are seen. A 6 mm nodule is again noted in the right middle lobe best seen on image number 80 of series 5. Thoracic inlet again demonstrates the thyroid to be somewhat enlarged in size. Previously seen left thyroid nodule is again noted and stable. There again noted changes consistent with ascending aortic repair. Persistent dissection flap is noted in the mid thoracic arch extending into the descending thoracic aorta. This is stable in appearance from the prior exam. The false lumen occupies the majority of  the descending thoracic aorta. The brachiocephalic vessels are supplied by  the true lumen. As is the celiac axis and superior mesenteric artery. The overall appearance of the aorta is stable from the prior exam. No new focal abnormality is seen. The pulmonary artery as visualized is within normal limits. No hilar or mediastinal adenopathy is noted. The visualized upper abdomen demonstrates decreased perfusion of the right kidney similar to that seen on the prior exam likely related to supplied from the false lumen. The osseous structures show no acute abnormality. Review of the MIP images confirms the above findings. IMPRESSION: Emphysematous changes. Stable 6 mm nodule in the right middle lobe. Stable changes of ascending aortic repair. There is a persistent dissection flap extending from the aortic arch to the descending aorta. This is stable from the prior exam. No new focal abnormality is noted. Electronically Signed   By: Alcide Clever M.D.   On: 08/28/2015 08:51    Ct Angio Chest Aorta W/cm &/or Wo/cm  04/10/2015  CLINICAL DATA:  History of dissection abdominal aortic aneurysm. Former smoker. EXAM: CT ANGIOGRAPHY CHEST, ABDOMEN AND PELVIS TECHNIQUE: Multidetector CT imaging through the chest, abdomen and pelvis was performed using the standard protocol during bolus administration of intravenous contrast. Multiplanar reconstructed images and MIPs were obtained and reviewed to evaluate the vascular anatomy. CONTRAST:  75 cc SVC 70 COMPARISON:  CT the chest, abdomen pelvis - 10/06/2014 FINDINGS: CTA CHEST FINDINGS Vascular Findings: The patient has undergone open repair of the ascending thoracic aorta. The bypass graft is widely patent. There is a minimal amount of ill-defined stranding within the anterior mediastinum which is favored to be postoperative. No contrast extravasation. Review of the precontrast images are negative for the presence of an intramural hematoma. There is a persistent dissection involving the cranial most aspect of the ascending thoracic aorta extending  through the aortic arch and the descending thoracic aorta to the level of the mid/distal aspect of the abdominal aorta. Both the true and false lumens of the abdominal aortic dissection remain widely patent through the level of the descending thoracic aorta. There has been interval enlargement of the aortic arch and descending thoracic aorta. The aortic arch now measures approximately 48 mm in diameter (previously, 34 mm) while the distal descending thoracic aorta now measures approximately 37 mm, previously, 32 mm. No evidence of periaortic stranding. Normal heart size. No pericardial effusion. Although this examination was not tailored for the evaluation the pulmonary arteries, there are no discrete filling defects within the central pulmonary arterial tree to suggest central pulmonary embolism. ------------------------------------------------------------- Thoracic aortic measurements: Sinotubular junction 30 mm measured in greatest oblique coronal dimension. Proximal ascending aorta The bypass graft measures approximately 35 mm in greatest oblique short axis diameter as measured in greatest oblique axial dimension at the level of the main pulmonary artery. Aortic arch aorta 48 mm as measured in greatest oblique sagittal dimension, previously 34 mm, an approximately 46 mm in greatest oblique short axis axial diameter (image 36, series 4), previously, 35 mm. Proximal descending thoracic aorta 41 mm as measured in greatest oblique axial dimension at the level of the main pulmonary artery, previously, 36 mm Distal descending thoracic aorta 37 mm as measured in greatest oblique axial dimension at the level of the diaphragmatic hiatus, previously 32 mm Review of the MIP images confirms the above findings. ------------------------------------------------------------- Non-Vascular Findings: Minimal dependent subpleural ground-glass atelectasis, left greater than right. No discrete focal airspace opacities. No pleural  effusion or pneumothorax. There is minimal subsegmental  atelectasis along the right minor fissure. Mild apical predominant paraseptal and centrilobular emphysematous change, right greater than left. Punctate (approximately 0.6 cm) noncalcified ground-glass nodule within in the right middle lobe (image 39, series 5), unchanged since the 09/2010/2016 examination. Scattered prominent mediastinal lymph nodes with index right sided precarinal lymph node measuring 1.1 cm in greatest short axis diameter (image 45, series 4). Additional scattered mediastinal lymph nodes are numerous though individually not enlarged by size criteria with index precarinal lymph node measuring 0.8 cm in greatest short axis diameter, likely reactive in etiology. No hilar axillary lymphadenopathy. Post median sternotomy. No acute or aggressive osseous abnormalities. There is diffuse heterogeneity of the thyroid gland with a suspected punctate (approximately 0.9 cm) hypo attenuating nodule within the left lobe of the thyroid (image 15, series 4). Review of the MIP images confirms the above findings. --------------------------------------------------------------------------------- CTA ABDOMEN AND PELVIS FINDINGS Vascular Findings: Abdominal aorta: The thoracic aortic dissection extends through the mid/ distal aspects of the abdominal aorta. The false lumen of the thoracic aortic dissection is again noted to supply the right renal artery as well as the majority of supplied to the celiac artery. While there is expected slightly delayed enhancement of the right kidney in relation to the left, there is no evidence of end organ ischemic change. The dissection is again noted to abut but not extend into the origin of the celiac artery as well as the SMA. Interval increase in size of the abdominal aorta now measuring 3 cm in diameter (image 147, series 4), previously, 2.3 cm, an approximately 3 cm in greatest oblique coronal short axis diameter (image 69,  series 601), previously, 2.4 cm. The dissection terminates at the level just caudal to the take-off of the IMA. The distal abdominal aorta is widely patent as is the previously thrombosed right common and external iliac arteries. Celiac artery: As above, the dissection extends to abut the origin of the celiac artery though not extend into the vessel and not definitely result in hemodynamically significant stenosis. The supply to the celiac artery is primarily via the false lumen. Conventional branching pattern. SMA: Supplied via the true lumen. Conventional branching pattern. The distal tributaries the SMA are widely patent without discrete intraluminal filling defect to suggest distal embolism. Right Renal artery: Solitary; supplied by the false lumen. Widely patent without hemodynamically significant narrowing. No vessel irregularity to suggest FMD. Left Renal artery: Duplicated ; both right-sided renal arteries are supplied by the true lumen. Both co-dominant left-sided renal arteries are widely patent without hemodynamically significant narrowing. No vessel irregularity to suggest FMD. IMA: Supplied by the true lumen. Widely patent without hemodynamically significant narrowing. Pelvic vasculature: As above, there is no longer extension of the thoracic aortic dissection thrombus to involve the right common and external iliac arteries. These vessels now remain widely patent. The bilateral external and internal iliac arteries are widely patent without hemodynamically significant narrowing. There is a thrombosed fem fem bypass graft with mild aneurysmal dilatation adjacent to the anastomosis. The bilateral deep and superficial femoral arteries are widely patent throughout their imaged course. Review of the MIP images confirms the above findings. -------------------------------------------------------------------------------- Nonvascular Findings: Evaluation of the abdominal organs is largely limited to the arterial  phase of enhancement. Normal hepatic contour. No discrete hyper enhancing hepatic lesions. Normal noncontrast appearance of the gallbladder. No radiopaque gallstones. No intra or extrahepatic biliary duct dilatation. No ascites. There is slightly delayed enhancement of the right kidney in comparison to the left secondary to the right kidney  being supplied via the false lumen of the abdominal aortic dissection. No renal stones. No urinary obstruction or perinephric stranding. Normal appearance of the bilateral adrenal glands, pancreas and spleen. Large left-sided indirect inguinal hernia which is noted to again contain multiple loops of nondilated small bowel, not resulting in enteric obstruction. Moderate to large colonic stool burden. The bowel is otherwise normal in course and caliber without wall thickening. Normal appearance of the terminal ileum and appendix. No pneumoperitoneum, pneumatosis or portal venous gas. No bulky retroperitoneal, mesenteric, pelvic or inguinal lymphadenopathy. Scattered calcifications within normal sized prostate gland. Normal appearance of the urinary bladder given degree distention. No free fluid in the pelvic cul-de-sac. No acute or aggressive osseous abnormalities within the abdomen or pelvis. Incidental note is made of a nondisplaced left-sided L5 pars defects without associated anterolisthesis. Regional soft tissues appear otherwise normal. Review of the MIP images confirms the above findings. IMPRESSION: Vascular Impression of the chest: 1. Post open repair of the ascending thoracic aorta without evidence of complication. 2. Persistent findings of a type A dissection originating immediately adjacent to the distal end of the open bypass graft. 3. Interval enlargement of the aortic arch and descending thoracic aorta (aortic arch now measures 48 mm in diameter, previously, 34 mm). No periaortic stranding. Nonvascular Impression of the chest: 1. No acute cardiopulmonary disease. 2.  Moderate centrilobular and paraseptal emphysematous change. 3. Punctate (approximately 0.5 cm) nodule within the right middle lobe is unchanged since the 09/2014 examination. Given risk factors for bronchogenic carcinoma, follow-up chest CT at 6 - 12 months is recommended. This recommendation follows the consensus statement: Guidelines for Management of Small Pulmonary Nodules Detected on CT Scans: A Statement from the Fleischner Society as published in Radiology 2005;237:395-400. 4. Punctate (approximately 0.9 cm) hypoattenuating nodule with the left lobe of the thyroid. Further evaluation with dedicated thyroid ultrasound could be performed as clinically indicated. ----------------------------------------------------------------------------------- Vascular Impression of the abdomen and pelvis: 1. There is persistent extension of the thoracic aortic dissection now terminating at the level of the mid/distal abdominal aorta. The dissection is again noted to abut the origin of both the celiac and SMA though not extend into either vessel. The right renal artery as well as the majority of the celiac artery are supplied via the false lumen. 2. Interval increase in size of the abdominal aorta now measuring 3 cm in diameter, previously, 2.3 cm. 3. The dissection no longer extends to involve the right common iliac artery. Both the right common and external iliac arteries are now appear widely patent. As such, the fem-fem bypass graft is occluded. Nonvascular Impression of the abdomen and pelvis: 1. Grossly unchanged large left-sided indirect inguinal hernia which is again noted to contain multiple loops of nondilated small bowel. Electronically Signed   By: Simonne ComeJohn  Watts M.D.   On: 04/10/2015 11:51    Recent Lab Findings: Lab Results  Component Value Date   WBC 5.7 02/03/2015   HGB 13.3 02/03/2015   HCT 40.4 02/03/2015   PLT 295 02/03/2015   GLUCOSE 85 12/23/2016   CHOL 121 12/23/2016   TRIG 57 12/23/2016   HDL  43 12/23/2016   LDLCALC 67 12/23/2016   ALT 11 12/23/2016   AST 13 12/23/2016   NA 141 12/23/2016   K 5.2 12/23/2016   CL 102 12/23/2016   CREATININE 1.34 (H) 12/23/2016   BUN 15 12/23/2016   CO2 24 12/23/2016   INR 1.59 (H) 10/07/2014  Transthoracic Echocardiography  Patient:    [name],  Filippo Puls MR #:       [id number] Study Date: 12/23/2016 Gender:     M Age:        61 Height:     170.2 cm Weight:     75.3 kg BSA:        1.9 m^2 Pt. Status: Room:   ATTENDING    Kristeen Miss, M.D.  ORDERING     Sheliah Plane MD  REFERRING    Sheliah Plane MD  REFERRING    [place] [id number]  SONOGRAPHER  Aida Raider, RDCS  PERFORMING   [facility]  cc:  ------------------------------------------------------------------- LV EF: 60% -   65%  ------------------------------------------------------------------- Indications:      I35.1 Aortic Valve Insufficiency.  ------------------------------------------------------------------- History:   PMH:  Acquired from the patient and from the patient&'s chart.  PMH:  Thoracic Aortic Aneurysm.  ------------------------------------------------------------------- Study Conclusions  - Left ventricle: The cavity size was normal. Wall thickness was   increased in a pattern of mild LVH. Systolic function was normal.   The estimated ejection fraction was in the range of 60% to 65%.   Wall motion was normal; there were no regional wall motion   abnormalities. Features are consistent with a pseudonormal left   ventricular filling pattern, with concomitant abnormal relaxation   and increased filling pressure (grade 2 diastolic dysfunction). - Aortic valve: There was trivial regurgitation.  ------------------------------------------------------------------- Study data:   Study status:  Routine.  Procedure:  The patient reported no pain pre or post test. Transthoracic echocardiography for left ventricular function  evaluation, for right ventricular function evaluation, and for assessment of valvular function. Image quality was adequate.  Study completion:  There were no complications.          Transthoracic echocardiography.  M-mode, complete 2D, spectral Doppler, and color Doppler.  Birthdate: Patient birthdate: 09-Sep-1955.  Age:  Patient is 62 yr old.  Sex: Gender: male.    BMI: 26 kg/m^2.  Blood pressure:     140/93 Patient status:  Outpatient.  Study date:  Study date: 12/23/2016. Study time: 09:35 AM.  Location:  [facility]  -------------------------------------------------------------------  ------------------------------------------------------------------- Left ventricle:  The cavity size was normal. Wall thickness was increased in a pattern of mild LVH. Systolic function was normal. The estimated ejection fraction was in the range of 60% to 65%. Wall motion was normal; there were no regional wall motion abnormalities. Features are consistent with a pseudonormal left ventricular filling pattern, with concomitant abnormal relaxation and increased filling pressure (grade 2 diastolic dysfunction).  ------------------------------------------------------------------- Aortic valve:   Structurally normal valve.   Cusp separation was normal.  Doppler:  Transvalvular velocity was within the normal range. There was no stenosis. There was trivial regurgitation.  ------------------------------------------------------------------- Aorta:  Aortic root: The aortic root was normal in size. Ascending aorta: The ascending aorta was normal in size.  ------------------------------------------------------------------- Mitral valve:   Structurally normal valve.   Leaflet separation was normal.  Doppler:  Transvalvular velocity was within the normal range. There was no evidence for stenosis. There was no regurgitation.  ------------------------------------------------------------------- Left  atrium:  The atrium was normal in size.  ------------------------------------------------------------------- Right ventricle:  The cavity size was normal. Systolic function was normal.  ------------------------------------------------------------------- Pulmonic valve:    The valve appears to be grossly normal. Doppler:  There was no significant regurgitation.  ------------------------------------------------------------------- Tricuspid valve:   The valve appears to be grossly normal. Doppler:  There was mild regurgitation.  ------------------------------------------------------------------- Pulmonary artery:   Systolic pressure was  within the normal range.   ------------------------------------------------------------------- Right atrium:  The atrium was normal in size.  ------------------------------------------------------------------- Pericardium:  There was no pericardial effusion.  ------------------------------------------------------------------- Systemic veins: Inferior vena cava: The vessel was normal in size. The respirophasic diameter changes were in the normal range (>= 50%), consistent with normal central venous pressure.  ------------------------------------------------------------------- Measurements   Left ventricle                         Value        Reference  LV ID, ED, PLAX chordal        (L)     34.3  mm     43 - 52  LV ID, ES, PLAX chordal                26.1  mm     23 - 38  LV fx shortening, PLAX chordal (L)     24    %      >=29  LV PW thickness, ED                    11.6  mm     ---------  IVS/LV PW ratio, ED                    1.12         <=1.3  Stroke volume, 2D                      55    ml     ---------  Stroke volume/bsa, 2D                  29    ml/m^2 ---------  LV e&', lateral                         10.4  cm/s   ---------  LV E/e&', lateral                       6.79         ---------  LV e&', medial                           9.87  cm/s   ---------  LV E/e&', medial                        7.15         ---------  LV e&', average                         10.14 cm/s   ---------  LV E/e&', average                       6.97         ---------    Ventricular septum                     Value        Reference  IVS thickness, ED                      13    mm     ---------    LVOT  Value        Reference  LVOT ID, S                             21    mm     ---------  LVOT area                              3.46  cm^2   ---------  LVOT ID                                21    mm     ---------  LVOT peak velocity, S                  82    cm/s   ---------  LVOT mean velocity, S                  60.7  cm/s   ---------  LVOT VTI, S                            15.8  cm     ---------  LVOT peak gradient, S                  3     mm Hg  ---------  Stroke volume (SV), LVOT DP            54.7  ml     ---------  Stroke index (SV/bsa), LVOT DP         28.8  ml/m^2 ---------    Aorta                                  Value        Reference  Aortic root ID, ED                     40    mm     ---------  Ascending aorta ID, A-P, S             32    mm     ---------    Left atrium                            Value        Reference  LA ID, A-P, ES                         41    mm     ---------  LA ID/bsa, A-P                         2.16  cm/m^2 <=2.2  LA volume, S                           40    ml     ---------  LA volume/bsa, S                       21.1  ml/m^2 ---------  LA volume, ES, 1-p  A4C                 47    ml     ---------  LA volume/bsa, ES, 1-p A4C             24.7  ml/m^2 ---------  LA volume, ES, 1-p A2C                 30    ml     ---------  LA volume/bsa, ES, 1-p A2C             15.8  ml/m^2 ---------    Mitral valve                           Value        Reference  Mitral E-wave peak velocity            70.6  cm/s   ---------  Mitral A-wave peak velocity            73.1  cm/s    ---------  Mitral deceleration time               197   ms     150 - 230  Mitral E/A ratio, peak                 1            ---------    Tricuspid valve                        Value        Reference  Tricuspid regurg peak velocity         248   cm/s   ---------  Tricuspid peak RV-RA gradient          25    mm Hg  ---------    Right ventricle                        Value        Reference  RV s&', lateral, S                      15.7  cm/s   ---------  Legend: (L)  and  (H)  mark values outside specified reference range.  ------------------------------------------------------------------- Prepared and Electronically Authenticated by  Kristeen Miss, M.D. 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 The fem-femoral bypass is clotted but he has preserved flow to both lowe extremities without symptoms On exam the patient has no evidence of aortic insufficiency 2 vessel coronary artery disease by ct  I reviewed with the patient the progressive enlargement of the proximal descending aorta and with its increasing size the need to intervene.  Recent echocardiogram has ruled out  aortic insufficiency,  patient has known coronary calcification but is never formally had cardiac catheterization or evaluation of his coronary arteries. In spite of negative stress test with the plans to perform redo sternotomy circulatory arrest and replacement of the aortic arch we will plan to have the patient have a formal cardiac catheterization to rule out the possibility of concomitant coronary artery disease.   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 We'll have urology review the scan and recommend follow-up. The case has been reviewed with Dr. Myra Gianotti tentative plan for circulatory arrest hypothermic with  replacement of the aortic arch and placement of thoracic stent graft, tentatively September 28       [name] MD      [address] [place] [address] Office [phone]   Beeper [phone]  01/06/2017 12:05 PM

## 2017-01-10 ENCOUNTER — Telehealth: Payer: Self-pay

## 2017-01-10 NOTE — Telephone Encounter (Signed)
Attempted outreach to Pt.  Call rang and then went to busy signal.  Unable to leave message.

## 2017-01-11 ENCOUNTER — Ambulatory Visit (HOSPITAL_COMMUNITY)
Admission: RE | Admit: 2017-01-11 | Discharge: 2017-01-11 | Disposition: A | Discharge status: Home / Self Care | Payer: Medicaid Other | Source: Ambulatory Visit | Attending: Cardiovascular Disease | Admitting: Cardiovascular Disease

## 2017-01-11 ENCOUNTER — Encounter (HOSPITAL_COMMUNITY)
Admission: RE | Disposition: A | Discharge status: Home / Self Care | Payer: Self-pay | Source: Ambulatory Visit | Attending: Cardiovascular Disease

## 2017-01-11 DIAGNOSIS — Z87891 Personal history of nicotine dependence: Secondary | ICD-10-CM | POA: Insufficient documentation

## 2017-01-11 DIAGNOSIS — I7100 Dissection of unspecified site of aorta: Secondary | ICD-10-CM | POA: Diagnosis present

## 2017-01-11 DIAGNOSIS — Z79899 Other long term (current) drug therapy: Secondary | ICD-10-CM | POA: Insufficient documentation

## 2017-01-11 DIAGNOSIS — Z7982 Long term (current) use of aspirin: Secondary | ICD-10-CM | POA: Insufficient documentation

## 2017-01-11 DIAGNOSIS — I712 Thoracic aortic aneurysm, without rupture: Secondary | ICD-10-CM

## 2017-01-11 DIAGNOSIS — I7101 Dissection of thoracic aorta: Secondary | ICD-10-CM | POA: Insufficient documentation

## 2017-01-11 DIAGNOSIS — Z8249 Family history of ischemic heart disease and other diseases of the circulatory system: Secondary | ICD-10-CM | POA: Insufficient documentation

## 2017-01-11 HISTORY — PX: RIGHT/LEFT HEART CATH AND CORONARY ANGIOGRAPHY: CATH118266

## 2017-01-11 LAB — CBC
HEMATOCRIT: 44.7 % (ref 39.0–52.0)
Hemoglobin: 14.7 g/dL (ref 13.0–17.0)
MCH: 28.2 pg (ref 26.0–34.0)
MCHC: 32.9 g/dL (ref 30.0–36.0)
MCV: 85.6 fL (ref 78.0–100.0)
Platelets: 219 10*3/uL (ref 150–400)
RBC: 5.22 MIL/uL (ref 4.22–5.81)
RDW: 14.3 % (ref 11.5–15.5)
WBC: 4.3 10*3/uL (ref 4.0–10.5)

## 2017-01-11 LAB — POCT I-STAT 3, ART BLOOD GAS (G3+)
Acid-base deficit: 1 mmol/L (ref 0.0–2.0)
Bicarbonate: 23.3 mmol/L (ref 20.0–28.0)
O2 Saturation: 94 %
PH ART: 7.419 (ref 7.350–7.450)
TCO2: 24 mmol/L (ref 22–32)
pCO2 arterial: 36 mmHg (ref 32.0–48.0)
pO2, Arterial: 69 mmHg — ABNORMAL LOW (ref 83.0–108.0)

## 2017-01-11 LAB — BASIC METABOLIC PANEL
ANION GAP: 6 (ref 5–15)
BUN: 14 mg/dL (ref 6–20)
CALCIUM: 9 mg/dL (ref 8.9–10.3)
CO2: 28 mmol/L (ref 22–32)
Chloride: 105 mmol/L (ref 101–111)
Creatinine, Ser: 1.27 mg/dL — ABNORMAL HIGH (ref 0.61–1.24)
GFR, EST NON AFRICAN AMERICAN: 60 mL/min — AB (ref >60)
Glucose, Bld: 99 mg/dL (ref 65–99)
Potassium: 4.5 mmol/L (ref 3.5–5.1)
Sodium: 139 mmol/L (ref 135–145)

## 2017-01-11 LAB — POCT I-STAT 3, VENOUS BLOOD GAS (G3P V)
Acid-base deficit: 1 mmol/L (ref 0.0–2.0)
Bicarbonate: 24 mmol/L (ref 20.0–28.0)
O2 Saturation: 72 %
PO2 VEN: 37 mmHg (ref 32.0–45.0)
TCO2: 25 mmol/L (ref 22–32)
pCO2, Ven: 38.5 mmHg — ABNORMAL LOW (ref 44.0–60.0)
pH, Ven: 7.403 (ref 7.250–7.430)

## 2017-01-11 LAB — PROTIME-INR
INR: 1.1
Prothrombin Time: 14.1 seconds (ref 11.4–15.2)

## 2017-01-11 SURGERY — RIGHT/LEFT HEART CATH AND CORONARY ANGIOGRAPHY
Anesthesia: LOCAL

## 2017-01-11 MED ORDER — HEPARIN (PORCINE) IN NACL 2-0.9 UNIT/ML-% IJ SOLN
INTRAMUSCULAR | Status: AC | PRN
  Administered 2017-01-11: 1000 mL

## 2017-01-11 MED ORDER — LIDOCAINE HCL (PF) 1 % IJ SOLN
INTRAMUSCULAR | Status: DC | PRN
  Administered 2017-01-11: 1 mL
  Administered 2017-01-11: 2 mL

## 2017-01-11 MED ORDER — ACETAMINOPHEN 325 MG PO TABS: 650.0000 mg | ORAL_TABLET | ORAL | Status: DC | PRN

## 2017-01-11 MED ORDER — SODIUM CHLORIDE 0.9 % WEIGHT BASED INFUSION: 1.0000 mL/kg/h | INTRAVENOUS | Status: DC

## 2017-01-11 MED ORDER — MIDAZOLAM HCL 2 MG/2ML IJ SOLN
INTRAMUSCULAR | Status: AC
  Filled 2017-01-11: qty 2

## 2017-01-11 MED ORDER — MIDAZOLAM HCL 2 MG/2ML IJ SOLN
INTRAMUSCULAR | Status: DC | PRN
  Administered 2017-01-11: 2 mg via INTRAVENOUS

## 2017-01-11 MED ORDER — SODIUM CHLORIDE 0.9 % IV SOLN: 250.0000 mL | INTRAVENOUS | Status: DC | PRN

## 2017-01-11 MED ORDER — VERAPAMIL HCL 2.5 MG/ML IV SOLN
INTRAVENOUS | Status: AC
  Filled 2017-01-11: qty 2

## 2017-01-11 MED ORDER — VERAPAMIL HCL 2.5 MG/ML IV SOLN
INTRAVENOUS | Status: DC | PRN
  Administered 2017-01-11: 10 mL via INTRA_ARTERIAL

## 2017-01-11 MED ORDER — SODIUM CHLORIDE 0.9% FLUSH: 3.0000 mL | Freq: Two times a day (BID) | INTRAVENOUS | Status: DC

## 2017-01-11 MED ORDER — FENTANYL CITRATE (PF) 100 MCG/2ML IJ SOLN
INTRAMUSCULAR | Status: DC | PRN
  Administered 2017-01-11: 25 ug via INTRAVENOUS

## 2017-01-11 MED ORDER — SODIUM CHLORIDE 0.9 % WEIGHT BASED INFUSION
3.0000 mL/kg/h | INTRAVENOUS | Status: AC
  Administered 2017-01-11: 3 mL/kg/h via INTRAVENOUS

## 2017-01-11 MED ORDER — FENTANYL CITRATE (PF) 100 MCG/2ML IJ SOLN
INTRAMUSCULAR | Status: AC
  Filled 2017-01-11: qty 2

## 2017-01-11 MED ORDER — ASPIRIN 81 MG PO CHEW
CHEWABLE_TABLET | ORAL | Status: AC
  Administered 2017-01-11: 324 mg via ORAL
  Filled 2017-01-11: qty 4

## 2017-01-11 MED ORDER — HEPARIN (PORCINE) IN NACL 2-0.9 UNIT/ML-% IJ SOLN
INTRAMUSCULAR | Status: AC
  Filled 2017-01-11: qty 1000

## 2017-01-11 MED ORDER — ONDANSETRON HCL 4 MG/2ML IJ SOLN: 4.0000 mg | Freq: Four times a day (QID) | INTRAMUSCULAR | Status: DC | PRN

## 2017-01-11 MED ORDER — SODIUM CHLORIDE 0.9% FLUSH: 3.0000 mL | INTRAVENOUS | Status: DC | PRN

## 2017-01-11 MED ORDER — LIDOCAINE HCL 2 % IJ SOLN
INTRAMUSCULAR | Status: AC
  Filled 2017-01-11: qty 10

## 2017-01-11 MED ORDER — SODIUM CHLORIDE 0.9% FLUSH
3.0000 mL | INTRAVENOUS | Status: DC | PRN
Start: 2017-01-12 — End: 2017-01-11

## 2017-01-11 MED ORDER — HEPARIN SODIUM (PORCINE) 1000 UNIT/ML IJ SOLN
INTRAMUSCULAR | Status: DC | PRN
  Administered 2017-01-11: 4000 [IU] via INTRAVENOUS

## 2017-01-11 MED ORDER — HEPARIN SODIUM (PORCINE) 1000 UNIT/ML IJ SOLN
INTRAMUSCULAR | Status: AC
  Filled 2017-01-11: qty 1

## 2017-01-11 MED ORDER — IOPAMIDOL (ISOVUE-370) INJECTION 76%
INTRAVENOUS | Status: AC
  Filled 2017-01-11: qty 100

## 2017-01-11 MED ORDER — IOPAMIDOL (ISOVUE-370) INJECTION 76%
INTRAVENOUS | Status: DC | PRN
  Administered 2017-01-11: 45 mL via INTRA_ARTERIAL

## 2017-01-11 MED ORDER — ASPIRIN 81 MG PO CHEW
324.0000 mg | CHEWABLE_TABLET | ORAL | Status: AC
  Administered 2017-01-11: 324 mg via ORAL

## 2017-01-11 SURGICAL SUPPLY — 11 items
CATH 5FR JL3.5 JR4 ANG PIG MP (CATHETERS) ×2 IMPLANT
CATH BALLN WEDGE 5F 110CM (CATHETERS) ×1 IMPLANT
DEVICE RAD COMP TR BAND LRG (VASCULAR PRODUCTS) ×2 IMPLANT
GLIDESHEATH SLEND SS 6F .021 (SHEATH) ×1 IMPLANT
GUIDEWIRE INQWIRE 1.5J.035X260 (WIRE) IMPLANT
INQWIRE 1.5J .035X260CM (WIRE) ×2
KIT HEART LEFT (KITS) ×2 IMPLANT
PACK CARDIAC CATHETERIZATION (CUSTOM PROCEDURE TRAY) ×2 IMPLANT
SHEATH GLIDE SLENDER 4/5FR (SHEATH) ×2 IMPLANT
TRANSDUCER W/STOPCOCK (MISCELLANEOUS) ×2 IMPLANT
TUBING CIL FLEX 10 FLL-RA (TUBING) ×2 IMPLANT

## 2017-01-11 NOTE — Discharge Instructions (Signed)

## 2017-01-11 NOTE — Interval H&P Note (Signed)
History and Physical Interval Note:  01/11/2017 4:10 PM  Victor [name]  has presented today for surgery, with the diagnosis of Thoracic aortic aneurysm and pre cardiac surgery  The various methods of treatment have been discussed with the patient and family. After consideration of risks, benefits and other options for treatment, the patient has consented to  Procedure(s): RIGHT/LEFT HEART CATH AND CORONARY ANGIOGRAPHY- Diagnostic Only (N/A) as a surgical intervention .  The patient's history has been reviewed, patient examined, no change in status, stable for surgery.  I have reviewed the patient's chart and labs.  Questions were answered to the patient's satisfaction.    Pt presents for cardiac catheterization to evaluate coronary arteries prior to thoracic aortic aneurysm surgery. Risks/indications/alternatives reviewed with the patient.  [name]

## 2017-01-11 NOTE — Interval H&P Note (Signed)
History and Physical Interval Note:  01/11/2017 1:35 PM  Victor [name]  has presented today for surgery, with the diagnosis of Thoracic aortic aneurysm and pre cardiac surgery  The various methods of treatment have been discussed with the patient and family. After consideration of risks, benefits and other options for treatment, the patient has consented to  Procedure(s): RIGHT/LEFT HEART CATH AND CORONARY ANGIOGRAPHY- Diagnostic Only (N/A) as a surgical intervention .  The patient's history has been reviewed, patient examined, no change in status, stable for surgery.  I have reviewed the patient's chart and labs.  Questions were answered to the patient's satisfaction.     [name]

## 2017-01-11 NOTE — H&P (View-Only) (Signed)
    [address]       [place] [address]             [phone]                    Brandin E Gallina Bartolo Medical Record #[id number] Date of Birth: 08/24/1955  Referring: [name], Wayland, MD Primary Care: [name], Wayland, MD  Chief Complaint:    Chief Complaint  Patient presents with  . Follow-up    after CARDIOLOGY CL/STREES/TEST 12/15/16, ECHO 12/23/16, MRI ABD 01/05/17 and apt. with Dr. Brabham   10/06/2014 OPERATIVE REPORT PREOPERATIVE DIAGNOSIS: Acute type 1 aortic dissection originating just at the takeoff of the right coronary ostium extending into the right external iliac artery. 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 SURGICAL PROCEDURE: Repair of ascending aortic dissection with replacement of ascending aorta and resuspension of the aortic valve with cardiopulmonary bypass and hypothermic circulatory arrest. SURGEON: Waynetta Metheny, MD  History of Present Illness:    Victor [name] 61 y.o. male is seen in the office  today for follow-up after repair of acute type I aortic dissection and subsequent need for femorofemoral bypass because of ischemic right leg in the early postoperative period. Since surgery the patient has stopped smoking. He has not had his large inguinal hernia repaired yet.    Since last seen he has consulted with vascular surgery about a combined the branching of the aortic arch and placement of thoracic aortic stent graft. Cardiac stress test has been done and is low risk.  Current Activity/ Functional Status:  Patient is independent with mobility/ambulation, transfers, ADL's, IADL's.   Zubrod Score: At the time of surgery this patient's most appropriate activity status/level should be described as: []    0    Normal activity, no symptoms [x]    1    Restricted in physical strenuous  activity but ambulatory, able to do out light work []    2    Ambulatory and capable of self care, unable to do work activities, up and about               >50 % of waking hours                              []    3    Only limited self care, in bed greater than 50% of waking hours []    4    Completely disabled, no self care, confined to bed or chair []    5    Moribund   Past Medical History:  Diagnosis Date  . Thoracic aortic aneurysm (HCC) 09/2014    Past Surgical History:  Procedure Laterality Date  . FEMORAL-FEMORAL BYPASS GRAFT N/A 10/07/2014   Procedure: LEFT  FEMORAL ARTERY -RIGHT FEMORAL ARTERY BYPASS GRAFT USING 8MM X 30 CM HEMASHIELD GOLD GRAFT;  Surgeon: Todd F Early, MD;  Location: [facility] OR;  Service: Vascular;  Laterality: N/A;  . THORACIC AORTIC ANEURYSM REPAIR N/A 10/06/2014   Procedure: THORACIC ASCENDING ANEURYSM REPAIR (AAA);  Surgeon: Chardae Mulkern B Tashena Ibach, MD;  Location: [facility] OR;  Service: Open Heart Surgery;  Laterality: N/A;  hyportermia circulatory arrest and resuspension of aortic valve    Family History  Problem Relation Age of Onset  . Heart murmur Mother       Social History   Social History  . Marital status: Single    Spouse name: N/A  . Number of children: N/A  . Years of education: N/A   Occupational History  . Not on file.   Social History Main Topics  . Smoking status: Former Smoker    Years: 35.00    Types: Cigarettes, Cigars    Quit date: 10/07/2014  . Smokeless tobacco: Never Used  . Alcohol use No  . Drug use: No  . Sexual activity: Not on file   Other Topics Concern  . Not on file   Social History Narrative  . No narrative on file    History  Smoking Status  . Former Smoker  . Years: 35.00  . Types: Cigarettes, Cigars  . Quit date: 10/07/2014  Smokeless Tobacco  . Never Used    History  Alcohol Use No     No Known Allergies  Current Outpatient Prescriptions  Medication Sig Dispense Refill  . aspirin EC 325 MG EC tablet Take  1 tablet (325 mg total) by mouth daily.    . metoprolol tartrate (LOPRESSOR) 25 MG tablet take 1 tablet by mouth twice a day 180 tablet 0  . oxyCODONE-acetaminophen (PERCOCET) 10-325 MG tablet Take 1 tablet by mouth every 8 (eight) hours as needed for pain.     No current facility-administered medications for this visit.       Review of Systems:     Cardiac Review of Systems: Y or N  Chest Pain [  n  ]  Resting SOB [  n ] Exertional SOB  [n Orthopnea [  n]   Pedal Edema [n   ]    Palpitations [n  ] Syncope  [ n ]   Presyncope [ n  ]  General Review of Systems: [Y] = yes [  ]=no Constitional: recent weight change [  ];  Wt loss over the last 3 months [   ] anorexia [  ]; fatigue [n]; nausea [  ]; night sweats [  ]; fever [  ]; or chills [  ];          Dental: poor dentition[  ]; Last Dentist visit:   Eye : blurred vision [n  ]; diplopia [n   ]; vision changes [ n ];  Amaurosis fugax[n  ]; Resp: cough [  ];  wheezing[  ];  hemoptysis[  ]; shortness of breath[  ]; paroxysmal nocturnal dyspnea[ n ]; dyspnea on exertion[ n ]; or orthopnea[  ];  GI:  gallstones[  ], vomiting[  ];  dysphagia[  ]; melena[  ];  hematochezia [  ]; heartburn[  ];   Hx of  Colonoscopy[  ]; GU: kidney stones [  ]; hematuria[ n ];   dysuria [ n ];  nocturia[  ];  history of     obstruction [  ]; urinary frequency [  ]             Skin: rash, swelling[  ];, hair loss[  ];  peripheral edema[  ];  or itching[  ]; Musculosketetal: myalgias[ n ];  joint swelling[ n ];  joint erythema[n  ];  joint pain[  ];  back pain[n  ];  Heme/Lymph: bruising[  ];  bleeding[  ];  anemia[  ];  Neuro: TIA[  ];  headaches[  ];  stroke[  ];  vertigo[  ];  seizures[  ];   paresthesias[n  ];  difficulty walking[ n ];    Psych:depression[  ]; anxiety[  ];  Endocrine: diabetes[  ];  thyroid dysfunction[  ];  Immunizations: Flu up to date [ n ]; Pneumococcal up to date [n  ];  Other:  Physical Exam: BP 130/88 (BP Location: Right Arm, Patient  Position: Sitting, Cuff Size: Large)   Pulse 80   Resp 16   Ht 5' 7" (1.702 m)   Wt 168 lb (76.2 kg)   SpO2 99% Comment: ON RA  BMI 26.31 kg/m   PHYSICAL EXAMINATION: Physical Exam  Constitutional: He is oriented to person, place, and time. No distress.  HENT:  Mouth/Throat: No oropharyngeal exudate.  Neck: No JVD present. No tracheal deviation present. No thyromegaly present.  Cardiovascular: Normal rate, regular rhythm, normal heart sounds and intact distal pulses.  Exam reveals no gallop and no friction rub.   No murmur heard. Respiratory: No stridor. No respiratory distress. He has no wheezes. He has no rales. He exhibits no tenderness.  GI: Soft. Bowel sounds are normal. He exhibits mass. He exhibits no distension. There is no tenderness. There is no rebound and no guarding.  Musculoskeletal: He exhibits no edema, tenderness or deformity.  Lymphadenopathy:    He has no cervical adenopathy.  Neurological: He is alert and oriented to person, place, and time.  Skin: Skin is warm and dry. He is not diaphoretic.  Psychiatric: He has a normal mood and affect. His behavior is normal. Judgment and thought content normal.  Large left inguinal hernia, palpable 1+ DP and PT pulses at the right ankle 2+ DP and PT pulses at the left ankle, he has bilateral femoral pulses but no palpable pulse in the femorofemoral crossover graft    Diagnostic Studies & Laboratory data:     Recent Radiology Findings:  Mr Abdomen Wwo Contrast  Result Date: 01/06/2017 CLINICAL DATA:  Renal lesion on recent CT scan. EXAM: MRI ABDOMEN WITHOUT AND WITH CONTRAST TECHNIQUE: Multiplanar multisequence MR imaging of the abdomen was performed both before and after the administration of intravenous contrast. CONTRAST:  15mL MULTIHANCE GADOBENATE DIMEGLUMINE 529 MG/ML IV SOLN COMPARISON:  CTA from 12/09/2016 FINDINGS: Lower chest:  No evidence for pleural effusion. Hepatobiliary: Liver unremarkable although incompletely  evaluated on this dedicated renal study. There is no evidence for gallstones, gallbladder wall thickening, or pericholecystic fluid. No intrahepatic or extrahepatic biliary dilation. Pancreas: No focal mass lesion. No dilatation of the main duct. No intraparenchymal cyst. No peripancreatic edema. Spleen: Visualized portions of the spleen are unremarkable. Adrenals/Urinary Tract: No adrenal nodule or mass. Right renal lesion of concern on prior CTA is identified towards the lower pole the right kidney. This 2.0 x 1.8 cm T1 hyperintense, T2 variable intensity, partially septated lesion shows layering debris or hemorrhage. No substantial enhancement within the lesion although the most delayed postcontrast subtraction imaging suggests possible small enhancing nodules (image 34 series 19 and image 37 series 19). Scattered small simple cysts are identified in the left kidney. No enhancing left renal mass. Stomach/Bowel: Stomach is nondistended. No gastric wall thickening. No evidence of outlet obstruction. Duodenum is normally positioned as is the ligament of Treitz. No small bowel or colonic dilatation within the visualized abdomen. Vascular/Lymphatic: Thoracolumbar aortic dissection better characterized on the recent CTA exam. Other: No intraperitoneal free fluid. Musculoskeletal: No abnormal marrow signal within the visualized bony anatomy. IMPRESSION: 1. 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 2. Small simple cysts (Bosniak I) in the left kidney. 3. Thoracoabdominal aortic dissection as   better characterized on recent CTA. Electronically Signed   By: Eric  Mansell M.D.   On: 01/06/2017 08:44    Result Date: 12/09/2016 CLINICAL DATA:  61-year-old male with chronic aortic dissection. EXAM: CT ANGIOGRAPHY CHEST, ABDOMEN AND PELVIS TECHNIQUE: Multidetector CT imaging through the chest, abdomen and pelvis was performed using the standard protocol during bolus administration  of intravenous contrast. Multiplanar reconstructed images and MIPs were obtained and reviewed to evaluate the vascular anatomy. Creatinine was obtained on site at [facility] at [address] Results: Creatinine 1.2 mg/dL. CONTRAST:  75 mL Isovue 370 COMPARISON:  Most recent prior chest CTA 05/27/2016 ; most recent prior CT scan of the chest, abdomen and pelvis 04/10/2015 FINDINGS: CTA CHEST FINDINGS Cardiovascular: Surgical changes of prior operative repair of the ascending portion of the known Stanford a thoracic aortic aneurysm. The tube graft remains unchanged in appearance. The dissection flap begins immediately beyond the distal anastomosis and extends throughout the thoracic aorta and into the abdominal aorta. Progressive aneurysmal dilatation of the aortic infundibulum now measuring 5.6 cm compared to 5.3 cm previously (confirmed on coronal reformatted images). The aneurysmal descending thoracic aorta remains unchanged in caliber measuring 5.5 cm at the level of the origin of the main pulmonary artery and 4.5 cm just proximal to the aortic hiatus. The arch vessels remain tortuous. The dissection flap does not extend into the vessels. Main pulmonary artery remains normal in size. The heart remains normal in size. No pericardial effusion. Mediastinum/Nodes: Similar appearance of heterogeneous multinodular thyroid gland. Mildly prominent right para-aortic lymph nodes again noted without significant change compared to 2016. These are likely reactive. No new adenopathy or mediastinal mass. Unremarkable thoracic esophagus. Lungs/Pleura: Paraseptal pulmonary emphysema with bullous change at the right lung apex. No evidence of pleural effusion, focal airspace consolidation, pulmonary edema or pneumothorax. Respiratory motion artifact noted in the lower lungs bilaterally. No suspicious nodule or mass. Musculoskeletal: No acute fracture or aggressive appearing lytic or blastic osseous lesion. Healed  median sternotomy. Review of the MIP images confirms the above findings. CTA ABDOMEN AND PELVIS FINDINGS VASCULAR Aorta: Chronic dissection of the the abdominal aorta again noted. The dissection flap extends into the infrarenal abdominal aorta and terminates proximal to the inferior mesenteric artery. There has been some remodeling of the intramural hematoma in the distal aorta compared to December of 2016. The visceral aorta remains mildly aneurysmal at 4.7 cm which is unchanged compared to prior. The remainder of the aorta remains within normal limits in size. Celiac: Fenestration of the dissection flap at the origin of the celiac artery. Thus, the celiac artery arises from both the true and false lumen. Widely patent. No aneurysm. SMA: Arises from the true lumen. Widely patent. No aneurysm or dissection. Renals: Fenestration at the level of the renal arteries providing cross flow. The right renal artery arises from the false lumen while the left renal artery arises from the true lumen. The renal arteries are normal in caliber. No evidence of dissection or fibromuscular dysplasia. IMA: Patent and unremarkable. Inflow: Relatively spared from disease.  Widely patent. Outflow: Ectasia of the bilateral common femoral artery is likely at sites of prior endarterectomy. There is a completely excluded fem-fem bypass graft. The visualized portions of the superficial and profunda femoral arteries are widely patent. Veins: No obvious venous abnormality within the limitations of this arterial phase study. Review of the MIP images confirms the above findings. NON-VASCULAR Hepatobiliary: Normal hepatic contour and morphology. No discrete hepatic lesions. Normal appearance of   the gallbladder. No intra or extrahepatic biliary ductal dilatation. Pancreas: Unremarkable. No pancreatic ductal dilatation or surrounding inflammatory changes. Spleen: Normal in size without focal abnormality. Adrenals/Urinary Tract: Normal adrenal  glands. Differential enhancement. There is relatively decreased enhancement of the right renal parenchyma. No evidence of hydronephrosis or nephrolithiasis. 2.2 cm intermediate attenuation in the lower pole of the right kidney has slowly grown in size compared to the initial CT scan from June of 2016 at which time it measured 1.6 cm. Small low-attenuation cyst exophytic from the upper pole of the left kidney remains unchanged and is likely a simple cyst. Unremarkable ureters and bladder. Stomach/Bowel: No focal bowel wall thickening or evidence of obstruction. Large left inguinal hernia containing multiple loops of small bowel and mesenteric. Normal appendix in the right lower quadrant. Lymphatic: No suspicious lymphadenopathy. Reproductive: Prostate is unremarkable. Other: Large left inguinal hernia.  Negative for ascites. Musculoskeletal: No acute fracture or aggressive appearing lytic or blastic osseous lesion. Right-sided facet arthropathy at L5-S1. Review of the MIP images confirms the above findings. IMPRESSION: CTA CHEST 1. Enlarging chronic aneurysmal thoracic aortic dissection. The aortic infundibulum now measures 5.6 cm compared to 5.3 cm on 05/27/2016 and 4.1 cm at the time of presentation in June of 2016. No evidence of propagation of the dissection flap. 2. Prior surgical repair of Stanford type A ascending thoracic aortic aneurysm with stable appearance of the ascending aortic tube graft. 3. Stable chronic aneurysmal dissection of the descending thoracic aorta. 4. Paraseptal emphysema with bullous change in the right lung apex. CTA ABD/PELVIS 1. Slight interval remodeling of the distal extent of the abdominal aortic dissection. Previously, the dissection flap extended more inferiorly as a thrombosed false lumen/intramural hematoma. The intramural hematoma component has resorbed over time and there is less overall dilatation of the distal abdominal aorta in the region of the inferior mesenteric artery.  Otherwise, stable dissection flap and ectasia of the super renal and juxtarenal abdominal aorta. No progression of aneurysmal dilatation or propagation of the dissection flap. 2. Enlarging intermediate attenuation lesion within the lower pole of the right kidney. This remains incompletely evaluated having not been included on prior noncontrast CT images. While it is possible that this represents a slowly enlarging complex cyst, a low grade renal neoplasm such as a papillary cell renal carcinoma is also a distinct possibility. Recommend further evaluation with renal protocol MRI of the abdomen with gadolinium contrast. 3. Persistent differential enhancement of the kidneys with relatively decreased enhancement of the right kidney (the right renal artery arises from the false lumen). No evidence of renal cortical atrophy. 4. Stable appearance of large left inguinal hernia containing multiple loops of small bowel and associated mesentery. 5. Additional ancillary findings as above without significant interval change. These results were called by telephone at the time of interpretation on 12/09/2016 at 12:14 pm to Dr. Masiyah Engen , who verbally acknowledged these results. Signed, Heath K. McCullough, MD [facility] Electronically Signed   By: Heath  McCullough M.D.   On: 12/09/2016 12:14   I have independently reviewed the above radiology studies  and reviewed the findings with the patient.   Ct Angio Chest Aorta W/cm &/or Wo/cm  Result Date: 05/27/2016 CLINICAL DATA:  60-year-old male with history of thoracic aortic dissection status post surgical repair in February 2016. Followup study. EXAM: CT ANGIOGRAPHY CHEST WITH CONTRAST TECHNIQUE: Multidetector CT imaging of the chest was performed using the standard protocol during bolus administration of intravenous contrast. Multiplanar   CT image reconstructions and MIPs were obtained to evaluate the vascular  anatomy. CONTRAST:  100 mL of Isovue 370. COMPARISON:  Chest CT 08/28/2015. FINDINGS: Cardiovascular: Heart size is normal. There is no significant pericardial fluid, thickening or pericardial calcification. There is aortic atherosclerosis, as well as atherosclerosis of the great vessels of the mediastinum and the coronary arteries, including calcified atherosclerotic plaque in the left anterior descending and right coronary arteries. Status post median sternotomy for graft replacement of the ascending thoracic aorta. The proximal and distal anastomoses of the graft are intact. No abnormal fluid collection around the graft. Immediately distal to the distal anastomosis there is an aortic dissection, which extends into the abdominal aorta (below the lower margin of the images). The dissection flap does not extend into the great vessels. The dissection flap over rides the origin of the celiac axis which appears to be fed by the false lumen. Aneurysmal dilatation of the distal aortic arch which measures up to 5.3 cm in diameter. There is also aneurysmal dilatation of the descending thoracic aorta which measures up to 4.2 x 4.7 cm (mean diameter of 4.5 cm). On the precontrast images there is no crescentic high attenuation associated with the wall of the thoracic aorta to suggest acute intramural hemorrhage. Mediastinum/Nodes: Multiple borderline enlarged and mildly enlarged mediastinal lymph nodes measuring up to 1 cm in short axis in the right paratracheal nodal stations, similar to the prior study. Esophagus is unremarkable in appearance. No axillary lymphadenopathy. Lungs/Pleura: New 3 x 5 mm (mean diameter 4 mm) pulmonary nodule in the right middle lobe (image 103 of series 14). No other larger more suspicious appearing pulmonary nodules or masses are noted. Scattered areas of mild linear scarring are noted throughout the lung bases. No acute consolidative airspace disease. No pleural effusions. Moderate paraseptal  emphysema most notable in the lung apices. Upper Abdomen: Unremarkable. Musculoskeletal: Median sternotomy wires. There are no aggressive appearing lytic or blastic lesions noted in the visualized portions of the skeleton. Review of the MIP images confirms the above findings. IMPRESSION: 1. Chronic type A thoracic aortic dissection status post graft repair of the ascending thoracic aorta, with persistent aneurysmal dilatation of the distal aortic arch (5.3 cm in diameter) and the descending thoracic aorta (mean diameter 4.5 cm), as above. No acute findings. 2. 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 3. Moderate paraseptal emphysema. 4.  2 vessel coronary artery disease. Electronically Signed   By: Daniel  Entrikin M.D.   On: 05/27/2016 12:21   Ct Angio Chest Aorta W/cm &/or Wo/cm  08/28/2015  CLINICAL DATA:  Followup thoracic aneurysm repair EXAM: CT ANGIOGRAPHY CHEST WITH CONTRAST TECHNIQUE: Multidetector CT imaging of the chest was performed using the standard protocol during bolus administration of intravenous contrast. Multiplanar CT image reconstructions and MIPs were obtained to evaluate the vascular anatomy. CONTRAST:  75 mL Isovue 370 COMPARISON:  04/10/2015 FINDINGS: Lungs are well aerated bilaterally. Diffuse emphysematous changes are seen. A 6 mm nodule is again noted in the right middle lobe best seen on image number 80 of series 5. Thoracic inlet again demonstrates the thyroid to be somewhat enlarged in size. Previously seen left thyroid nodule is again noted and stable. There again noted changes consistent with ascending aortic repair. Persistent dissection flap is noted in the mid thoracic arch extending into the descending thoracic aorta. This is stable in appearance from the prior exam. The false lumen occupies the majority of   the descending thoracic aorta. The brachiocephalic vessels are supplied by  the true lumen. As is the celiac axis and superior mesenteric artery. The overall appearance of the aorta is stable from the prior exam. No new focal abnormality is seen. The pulmonary artery as visualized is within normal limits. No hilar or mediastinal adenopathy is noted. The visualized upper abdomen demonstrates decreased perfusion of the right kidney similar to that seen on the prior exam likely related to supplied from the false lumen. The osseous structures show no acute abnormality. Review of the MIP images confirms the above findings. IMPRESSION: Emphysematous changes. Stable 6 mm nodule in the right middle lobe. Stable changes of ascending aortic repair. There is a persistent dissection flap extending from the aortic arch to the descending aorta. This is stable from the prior exam. No new focal abnormality is noted. Electronically Signed   By: Mark  Lukens M.D.   On: 08/28/2015 08:51    Ct Angio Chest Aorta W/cm &/or Wo/cm  04/10/2015  CLINICAL DATA:  History of dissection abdominal aortic aneurysm. Former smoker. EXAM: CT ANGIOGRAPHY CHEST, ABDOMEN AND PELVIS TECHNIQUE: Multidetector CT imaging through the chest, abdomen and pelvis was performed using the standard protocol during bolus administration of intravenous contrast. Multiplanar reconstructed images and MIPs were obtained and reviewed to evaluate the vascular anatomy. CONTRAST:  75 cc SVC 70 COMPARISON:  CT the chest, abdomen pelvis - 10/06/2014 FINDINGS: CTA CHEST FINDINGS Vascular Findings: The patient has undergone open repair of the ascending thoracic aorta. The bypass graft is widely patent. There is a minimal amount of ill-defined stranding within the anterior mediastinum which is favored to be postoperative. No contrast extravasation. Review of the precontrast images are negative for the presence of an intramural hematoma. There is a persistent dissection involving the cranial most aspect of the ascending thoracic aorta extending  through the aortic arch and the descending thoracic aorta to the level of the mid/distal aspect of the abdominal aorta. Both the true and false lumens of the abdominal aortic dissection remain widely patent through the level of the descending thoracic aorta. There has been interval enlargement of the aortic arch and descending thoracic aorta. The aortic arch now measures approximately 48 mm in diameter (previously, 34 mm) while the distal descending thoracic aorta now measures approximately 37 mm, previously, 32 mm. No evidence of periaortic stranding. Normal heart size. No pericardial effusion. Although this examination was not tailored for the evaluation the pulmonary arteries, there are no discrete filling defects within the central pulmonary arterial tree to suggest central pulmonary embolism. ------------------------------------------------------------- Thoracic aortic measurements: Sinotubular junction 30 mm measured in greatest oblique coronal dimension. Proximal ascending aorta The bypass graft measures approximately 35 mm in greatest oblique short axis diameter as measured in greatest oblique axial dimension at the level of the main pulmonary artery. Aortic arch aorta 48 mm as measured in greatest oblique sagittal dimension, previously 34 mm, an approximately 46 mm in greatest oblique short axis axial diameter (image 36, series 4), previously, 35 mm. Proximal descending thoracic aorta 41 mm as measured in greatest oblique axial dimension at the level of the main pulmonary artery, previously, 36 mm Distal descending thoracic aorta 37 mm as measured in greatest oblique axial dimension at the level of the diaphragmatic hiatus, previously 32 mm Review of the MIP images confirms the above findings. ------------------------------------------------------------- Non-Vascular Findings: Minimal dependent subpleural ground-glass atelectasis, left greater than right. No discrete focal airspace opacities. No pleural  effusion or pneumothorax. There is minimal subsegmental   atelectasis along the right minor fissure. Mild apical predominant paraseptal and centrilobular emphysematous change, right greater than left. Punctate (approximately 0.6 cm) noncalcified ground-glass nodule within in the right middle lobe (image 39, series 5), unchanged since the 09/2010/2016 examination. Scattered prominent mediastinal lymph nodes with index right sided precarinal lymph node measuring 1.1 cm in greatest short axis diameter (image 45, series 4). Additional scattered mediastinal lymph nodes are numerous though individually not enlarged by size criteria with index precarinal lymph node measuring 0.8 cm in greatest short axis diameter, likely reactive in etiology. No hilar axillary lymphadenopathy. Post median sternotomy. No acute or aggressive osseous abnormalities. There is diffuse heterogeneity of the thyroid gland with a suspected punctate (approximately 0.9 cm) hypo attenuating nodule within the left lobe of the thyroid (image 15, series 4). Review of the MIP images confirms the above findings. --------------------------------------------------------------------------------- CTA ABDOMEN AND PELVIS FINDINGS Vascular Findings: Abdominal aorta: The thoracic aortic dissection extends through the mid/ distal aspects of the abdominal aorta. The false lumen of the thoracic aortic dissection is again noted to supply the right renal artery as well as the majority of supplied to the celiac artery. While there is expected slightly delayed enhancement of the right kidney in relation to the left, there is no evidence of end organ ischemic change. The dissection is again noted to abut but not extend into the origin of the celiac artery as well as the SMA. Interval increase in size of the abdominal aorta now measuring 3 cm in diameter (image 147, series 4), previously, 2.3 cm, an approximately 3 cm in greatest oblique coronal short axis diameter (image 69,  series 601), previously, 2.4 cm. The dissection terminates at the level just caudal to the take-off of the IMA. The distal abdominal aorta is widely patent as is the previously thrombosed right common and external iliac arteries. Celiac artery: As above, the dissection extends to abut the origin of the celiac artery though not extend into the vessel and not definitely result in hemodynamically significant stenosis. The supply to the celiac artery is primarily via the false lumen. Conventional branching pattern. SMA: Supplied via the true lumen. Conventional branching pattern. The distal tributaries the SMA are widely patent without discrete intraluminal filling defect to suggest distal embolism. Right Renal artery: Solitary; supplied by the false lumen. Widely patent without hemodynamically significant narrowing. No vessel irregularity to suggest FMD. Left Renal artery: Duplicated ; both right-sided renal arteries are supplied by the true lumen. Both co-dominant left-sided renal arteries are widely patent without hemodynamically significant narrowing. No vessel irregularity to suggest FMD. IMA: Supplied by the true lumen. Widely patent without hemodynamically significant narrowing. Pelvic vasculature: As above, there is no longer extension of the thoracic aortic dissection thrombus to involve the right common and external iliac arteries. These vessels now remain widely patent. The bilateral external and internal iliac arteries are widely patent without hemodynamically significant narrowing. There is a thrombosed fem fem bypass graft with mild aneurysmal dilatation adjacent to the anastomosis. The bilateral deep and superficial femoral arteries are widely patent throughout their imaged course. Review of the MIP images confirms the above findings. -------------------------------------------------------------------------------- Nonvascular Findings: Evaluation of the abdominal organs is largely limited to the arterial  phase of enhancement. Normal hepatic contour. No discrete hyper enhancing hepatic lesions. Normal noncontrast appearance of the gallbladder. No radiopaque gallstones. No intra or extrahepatic biliary duct dilatation. No ascites. There is slightly delayed enhancement of the right kidney in comparison to the left secondary to the right kidney   being supplied via the false lumen of the abdominal aortic dissection. No renal stones. No urinary obstruction or perinephric stranding. Normal appearance of the bilateral adrenal glands, pancreas and spleen. Large left-sided indirect inguinal hernia which is noted to again contain multiple loops of nondilated small bowel, not resulting in enteric obstruction. Moderate to large colonic stool burden. The bowel is otherwise normal in course and caliber without wall thickening. Normal appearance of the terminal ileum and appendix. No pneumoperitoneum, pneumatosis or portal venous gas. No bulky retroperitoneal, mesenteric, pelvic or inguinal lymphadenopathy. Scattered calcifications within normal sized prostate gland. Normal appearance of the urinary bladder given degree distention. No free fluid in the pelvic cul-de-sac. No acute or aggressive osseous abnormalities within the abdomen or pelvis. Incidental note is made of a nondisplaced left-sided L5 pars defects without associated anterolisthesis. Regional soft tissues appear otherwise normal. Review of the MIP images confirms the above findings. IMPRESSION: Vascular Impression of the chest: 1. Post open repair of the ascending thoracic aorta without evidence of complication. 2. Persistent findings of a type A dissection originating immediately adjacent to the distal end of the open bypass graft. 3. Interval enlargement of the aortic arch and descending thoracic aorta (aortic arch now measures 48 mm in diameter, previously, 34 mm). No periaortic stranding. Nonvascular Impression of the chest: 1. No acute cardiopulmonary disease. 2.  Moderate centrilobular and paraseptal emphysematous change. 3. Punctate (approximately 0.5 cm) nodule within the right middle lobe is unchanged since the 09/2014 examination. Given risk factors for bronchogenic carcinoma, follow-up chest CT at 6 - 12 months is recommended. This recommendation follows the consensus statement: Guidelines for Management of Small Pulmonary Nodules Detected on CT Scans: A Statement from the Fleischner Society as published in Radiology 2005;237:395-400. 4. Punctate (approximately 0.9 cm) hypoattenuating nodule with the left lobe of the thyroid. Further evaluation with dedicated thyroid ultrasound could be performed as clinically indicated. ----------------------------------------------------------------------------------- Vascular Impression of the abdomen and pelvis: 1. There is persistent extension of the thoracic aortic dissection now terminating at the level of the mid/distal abdominal aorta. The dissection is again noted to abut the origin of both the celiac and SMA though not extend into either vessel. The right renal artery as well as the majority of the celiac artery are supplied via the false lumen. 2. Interval increase in size of the abdominal aorta now measuring 3 cm in diameter, previously, 2.3 cm. 3. The dissection no longer extends to involve the right common iliac artery. Both the right common and external iliac arteries are now appear widely patent. As such, the fem-fem bypass graft is occluded. Nonvascular Impression of the abdomen and pelvis: 1. Grossly unchanged large left-sided indirect inguinal hernia which is again noted to contain multiple loops of nondilated small bowel. Electronically Signed   By: John  Watts M.D.   On: 04/10/2015 11:51    Recent Lab Findings: Lab Results  Component Value Date   WBC 5.7 02/03/2015   HGB 13.3 02/03/2015   HCT 40.4 02/03/2015   PLT 295 02/03/2015   GLUCOSE 85 12/23/2016   CHOL 121 12/23/2016   TRIG 57 12/23/2016   HDL  43 12/23/2016   LDLCALC 67 12/23/2016   ALT 11 12/23/2016   AST 13 12/23/2016   NA 141 12/23/2016   K 5.2 12/23/2016   CL 102 12/23/2016   CREATININE 1.34 (H) 12/23/2016   BUN 15 12/23/2016   CO2 24 12/23/2016   INR 1.59 (H) 10/07/2014  Transthoracic Echocardiography  Patient:    Figiel,   Ladislao E MR #:       [id number] Study Date: 12/23/2016 Gender:     M Age:        61 Height:     170.2 cm Weight:     75.3 kg BSA:        1.9 m^2 Pt. Status: Room:   ATTENDING    Philip Nahser, M.D.  ORDERING     Carrie Schoonmaker MD  REFERRING    Danel Requena MD  REFERRING    [name], [place] [id number]  SONOGRAPHER  NaTashia Rodgers, RDCS  PERFORMING   [facility], [facility]  cc:  ------------------------------------------------------------------- LV EF: 60% -   65%  ------------------------------------------------------------------- Indications:      I35.1 Aortic Valve Insufficiency.  ------------------------------------------------------------------- History:   PMH:  Acquired from the patient and from the patient&'s chart.  PMH:  Thoracic Aortic Aneurysm.  ------------------------------------------------------------------- Study Conclusions  - Left ventricle: The cavity size was normal. Wall thickness was   increased in a pattern of mild LVH. Systolic function was normal.   The estimated ejection fraction was in the range of 60% to 65%.   Wall motion was normal; there were no regional wall motion   abnormalities. Features are consistent with a pseudonormal left   ventricular filling pattern, with concomitant abnormal relaxation   and increased filling pressure (grade 2 diastolic dysfunction). - Aortic valve: There was trivial regurgitation.  ------------------------------------------------------------------- Study data:   Study status:  Routine.  Procedure:  The patient reported no pain pre or post test. Transthoracic echocardiography for left ventricular function  evaluation, for right ventricular function evaluation, and for assessment of valvular function. Image quality was adequate.  Study completion:  There were no complications.          Transthoracic echocardiography.  M-mode, complete 2D, spectral Doppler, and color Doppler.  Birthdate: Patient birthdate: 09/08/1955.  Age:  Patient is 61 yr old.  Sex: Gender: male.    BMI: 26 kg/m^2.  Blood pressure:     140/93 Patient status:  Outpatient.  Study date:  Study date: 12/23/2016. Study time: 09:35 AM.  Location:  [facility]  -------------------------------------------------------------------  ------------------------------------------------------------------- Left ventricle:  The cavity size was normal. Wall thickness was increased in a pattern of mild LVH. Systolic function was normal. The estimated ejection fraction was in the range of 60% to 65%. Wall motion was normal; there were no regional wall motion abnormalities. Features are consistent with a pseudonormal left ventricular filling pattern, with concomitant abnormal relaxation and increased filling pressure (grade 2 diastolic dysfunction).  ------------------------------------------------------------------- Aortic valve:   Structurally normal valve.   Cusp separation was normal.  Doppler:  Transvalvular velocity was within the normal range. There was no stenosis. There was trivial regurgitation.  ------------------------------------------------------------------- Aorta:  Aortic root: The aortic root was normal in size. Ascending aorta: The ascending aorta was normal in size.  ------------------------------------------------------------------- Mitral valve:   Structurally normal valve.   Leaflet separation was normal.  Doppler:  Transvalvular velocity was within the normal range. There was no evidence for stenosis. There was no regurgitation.  ------------------------------------------------------------------- Left  atrium:  The atrium was normal in size.  ------------------------------------------------------------------- Right ventricle:  The cavity size was normal. Systolic function was normal.  ------------------------------------------------------------------- Pulmonic valve:    The valve appears to be grossly normal. Doppler:  There was no significant regurgitation.  ------------------------------------------------------------------- Tricuspid valve:   The valve appears to be grossly normal. Doppler:  There was mild regurgitation.  ------------------------------------------------------------------- Pulmonary artery:   Systolic pressure was   within the normal range.   ------------------------------------------------------------------- Right atrium:  The atrium was normal in size.  ------------------------------------------------------------------- Pericardium:  There was no pericardial effusion.  ------------------------------------------------------------------- Systemic veins: Inferior vena cava: The vessel was normal in size. The respirophasic diameter changes were in the normal range (>= 50%), consistent with normal central venous pressure.  ------------------------------------------------------------------- Measurements   Left ventricle                         Value        Reference  LV ID, ED, PLAX chordal        (L)     34.3  mm     43 - 52  LV ID, ES, PLAX chordal                26.1  mm     23 - 38  LV fx shortening, PLAX chordal (L)     24    %      >=29  LV PW thickness, ED                    11.6  mm     ---------  IVS/LV PW ratio, ED                    1.12         <=1.3  Stroke volume, 2D                      55    ml     ---------  Stroke volume/bsa, 2D                  29    ml/m^2 ---------  LV e&', lateral                         10.4  cm/s   ---------  LV E/e&', lateral                       6.79         ---------  LV e&', medial                           9.87  cm/s   ---------  LV E/e&', medial                        7.15         ---------  LV e&', average                         10.14 cm/s   ---------  LV E/e&', average                       6.97         ---------    Ventricular septum                     Value        Reference  IVS thickness, ED                      13    mm     ---------    LVOT                                     Value        Reference  LVOT ID, S                             21    mm     ---------  LVOT area                              3.46  cm^2   ---------  LVOT ID                                21    mm     ---------  LVOT peak velocity, S                  82    cm/s   ---------  LVOT mean velocity, S                  60.7  cm/s   ---------  LVOT VTI, S                            15.8  cm     ---------  LVOT peak gradient, S                  3     mm Hg  ---------  Stroke volume (SV), LVOT DP            54.7  ml     ---------  Stroke index (SV/bsa), LVOT DP         28.8  ml/m^2 ---------    Aorta                                  Value        Reference  Aortic root ID, ED                     40    mm     ---------  Ascending aorta ID, A-P, S             32    mm     ---------    Left atrium                            Value        Reference  LA ID, A-P, ES                         41    mm     ---------  LA ID/bsa, A-P                         2.16  cm/m^2 <=2.2  LA volume, S                           40    ml     ---------  LA volume/bsa, S                       21.1  ml/m^2 ---------  LA volume, ES, 1-p   A4C                 47    ml     ---------  LA volume/bsa, ES, 1-p A4C             24.7  ml/m^2 ---------  LA volume, ES, 1-p A2C                 30    ml     ---------  LA volume/bsa, ES, 1-p A2C             15.8  ml/m^2 ---------    Mitral valve                           Value        Reference  Mitral E-wave peak velocity            70.6  cm/s   ---------  Mitral A-wave peak velocity            73.1  cm/s    ---------  Mitral deceleration time               197   ms     150 - 230  Mitral E/A ratio, peak                 1            ---------    Tricuspid valve                        Value        Reference  Tricuspid regurg peak velocity         248   cm/s   ---------  Tricuspid peak RV-RA gradient          25    mm Hg  ---------    Right ventricle                        Value        Reference  RV s&', lateral, S                      15.7  cm/s   ---------  Legend: (L)  and  (H)  mark values outside specified reference range.  ------------------------------------------------------------------- Prepared and Electronically Authenticated by  Philip Nahser, M.D. 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 The fem-femoral bypass is clotted but he has preserved flow to both lowe extremities without symptoms On exam the patient has no evidence of aortic insufficiency 2 vessel coronary artery disease by ct  I reviewed with the patient the progressive enlargement of the proximal descending aorta and with its increasing size the need to intervene.  Recent echocardiogram has ruled out  aortic insufficiency,  patient has known coronary calcification but is never formally had cardiac catheterization or evaluation of his coronary arteries. In spite of negative stress test with the plans to perform redo sternotomy circulatory arrest and replacement of the aortic arch we will plan to have the patient have a formal cardiac catheterization to rule out the possibility of concomitant coronary artery disease.   Patient also has slowly enlarging complex mass in the right kidney, follow-up MRI of the complex right renal   cyst was done. We'll have urology review the scan and recommend follow-up. The case has been reviewed with Dr. Brabham tentative plan for circulatory arrest hypothermic with  replacement of the aortic arch and placement of thoracic stent graft, tentatively September 28       Hibo Blasdell B Zarie Kosiba MD      [address] [place] [address] Office [phone]   Beeper [phone]  01/06/2017 12:05 PM    

## 2017-01-11 NOTE — H&P (View-Only) (Signed)
Grossly normal   OFFICE NOTE  Chief Complaint:  Follow-up thoracic aortic aneurysm  Primary Care Physician: Billee Cashing, MD  HPI:  Victor [name] is a 61 y.o. male with a history of tobacco abuse, polysubstance abuse and recent thoracic aortic dissection. No family history of coronary artery disease. He was admitted from June 12 of 10/14/2014 and underwent repair of the ascending aortic dissection with replacement of the ascending aorta and suspension of the aortic valve with cardiopulmonary bypass. TEE during surgery revealed an ejection fraction of 60-65%.He was recently seen by Wilburt Finlay, PA-C-C and follow-up. He was noted to not be taking his beta blocker at the time. He says he may never have had this prescription filled. Otherwise he is asymptomatic. He denies any chest pain or shortness of breath. He was on iron in the hospital for anemia and is questioning whether he needs to be on this today or not. He reports he has stopped smoking which I've encouraged him to maintain.  09/23/2015  Victor [name] returns today for follow-up. He was recently seen by Dr. Ofilia Neas, who noted that his blood pressure was elevated. He increased his metoprolol to 25 mg twice a day. Today's blood pressure is well-controlled at 122/74. He denies any side effects from the increased dose of Lopressor. He is on aspirin 325 mg daily. EKG shows normal sinus rhythm at 91 with biatrial enlargement. He denies any chest pain or worsening shortness of breath. He is physically active. He has been having problems apparently with a hernia and is requesting a referral for primary care physician.  12/23/2016  Victor [name] returns today for follow-up of the thoracic aortic aneurysm. This previously described he data type I dissection in 2015 and had repair along with replacement of ascending aorta and resuspension of the aortic valve. Subsequently he's had progressive enlargement of thoracic aorta now measuring 5.6 cm.  He also has an unrepaired inguinal hernia. He was referred for preoperative stress evaluation and underwent Lexiscan Myoview which was negative for ischemia and showed normal LV function. He scheduled to have an echocardiogram today to assess he is aortic valve and degree of aortic insufficiency. He denies any chest pain or worsening shortness of breath. He's currently only on aspirin and metoprolol. He reports he is not taking his metoprolol for the last 3 days because he was not eating breakfast. I_the critical importance of taking that medicine and the fact he could take it without eating. Blood pressure is slightly elevated today 140/93 but his heart rate is in the 90s. This is not ideal given his aneurysm. He also has not had a lipid assessment since 2016. At the time LDL-C was 63 and not on medications. I'll plan to reassess that today as well as a metabolic profile.  PMHx:  Past Medical History:  Diagnosis Date  . Thoracic aortic aneurysm (HCC) 09/2014    Past Surgical History:  Procedure Laterality Date  . FEMORAL-FEMORAL BYPASS GRAFT N/A 10/07/2014   Procedure: LEFT  FEMORAL ARTERY -RIGHT FEMORAL ARTERY BYPASS GRAFT USING X 30 CM HEMASHIELD GOLD GRAFT;  Surgeon: Larina Earthly, MD;  Location: [facility];  Service: Vascular;  Laterality: N/A;  . THORACIC AORTIC ANEURYSM REPAIR N/A 10/06/2014   Procedure: THORACIC ASCENDING ANEURYSM REPAIR (AAA);  Surgeon: [name], MD;  Location: [facility] OR;  Service: Open Heart Surgery;  Laterality: N/A;  hyportermia circulatory arrest and resuspension of aortic valve    FAMHx:  Family History  Problem Relation Age  of Onset  . Heart murmur Mother     SOCHx:   reports that he quit smoking about 2 years ago. His smoking use included Cigarettes and Cigars. He quit after 35.00 years of use. He has never used smokeless tobacco. He reports that he does not drink alcohol or use drugs.  ALLERGIES:  No Known Allergies  ROS: Pertinent items noted in HPI and  remainder of comprehensive ROS otherwise negative.  HOME MEDS: Current Outpatient Prescriptions  Medication Sig Dispense Refill  . aspirin EC 325 MG EC tablet Take 1 tablet (325 mg total) by mouth daily.    . metoprolol tartrate (LOPRESSOR) 25 MG tablet take 1 tablet by mouth twice a day 180 tablet 0  . oxyCODONE-acetaminophen (PERCOCET) 10-325 MG tablet Take 1 tablet by mouth every 8 (eight) hours as needed for pain.     No current facility-administered medications for this visit.     LABS/IMAGING: No results found for this or any previous visit (from the past 48 hour(s)). No results found.  WEIGHTS: Wt Readings from Last 3 Encounters:  12/23/16 168 lb (76.2 kg)  12/20/16 165 lb 14.4 oz (75.3 kg)  12/15/16 167 lb (75.8 kg)    VITALS: BP (!) 140/93   Pulse 94   Ht 5\' 7"  (1.702 m)   Wt 168 lb (76.2 kg)   SpO2 98%   BMI 26.31 kg/m   EXAM: General appearance: alert and no distress Neck: no carotid bruit and no JVD Lungs: clear to auscultation bilaterally Heart: regular rate and rhythm, diastolic murmur: early diastolic 2/6, crescendo at 2nd right intercostal space and Prominent aortic pulsation in the anterior chest Abdomen: soft, non-tender; bowel sounds normal; no masses,  no organomegaly Extremities: extremities normal, atraumatic, no cyanosis or edema Pulses: 2+ and symmetric Skin: Skin color, texture, turgor normal. No rashes or lesions Neurologic: Grossly normal Psych: Pleasant  EKG: Deferred  ASSESSMENT: 1. Thoracic aortic dissection s/p repair by Dr. Tyrone SageGerhardt in 2016, now with 5.6 cm thoracic aortic aneurysm 2. Aortic insufficiency 3. Ischemic leg 4. Iron deficiency anemia 5. Essential hypertension 6. Large direct inguinal hernia, unrepaired 7. Acceptable risk for upcoming surgery  PLAN: 1.   Victor [name] had a Lexiscan Myoview which was negative for ischemia. He'll undergo an echo today. This was to assess his degree of aortic insufficiency. That is not a  loud murmur on exam today. He denies any worsening shortness of breath and walk up a flight of stairs. He has no chest pain. He should be acceptable risk for upcoming surgery. We'll go and check a repeat lipid profile is not on a statin. His last LDL was in the 60s in 2016. He also has not taken his metoprolol for the past 3 days and I underscored the importance of compliance with that medication. His heart rate was in the 90s and blood pressure was elevated today. He said he was to take the medicine when he gets home.   Follow-up with me in 6 months after surgeries.   Chrystie NoseKenneth C. Maejor Erven, MD, [facility] HospitalFACC Attending Cardiologist [facility]  Lisette AbuKenneth C Asees Manfredi 12/23/2016, 8:09 AM

## 2017-01-12 ENCOUNTER — Encounter (HOSPITAL_COMMUNITY): Payer: Self-pay | Admitting: Cardiovascular Disease

## 2017-01-19 ENCOUNTER — Ambulatory Visit (HOSPITAL_COMMUNITY)
Admission: RE | Admit: 2017-01-19 | Discharge: 2017-01-19 | Disposition: A | Discharge status: Home / Self Care | Payer: Medicaid Other | Source: Ambulatory Visit | Attending: Cardiothoracic Surgery | Admitting: Cardiothoracic Surgery

## 2017-01-19 ENCOUNTER — Ambulatory Visit (HOSPITAL_BASED_OUTPATIENT_CLINIC_OR_DEPARTMENT_OTHER)
Admission: RE | Admit: 2017-01-19 | Discharge: 2017-01-19 | Disposition: A | Discharge status: Home / Self Care | Payer: Medicaid Other | Source: Ambulatory Visit | Attending: Cardiothoracic Surgery | Admitting: Cardiothoracic Surgery

## 2017-01-19 ENCOUNTER — Encounter (HOSPITAL_COMMUNITY)
Admission: RE | Admit: 2017-01-19 | Discharge: 2017-01-19 | Disposition: A | Discharge status: Home / Self Care | Payer: Medicaid Other | Source: Ambulatory Visit | Attending: Cardiothoracic Surgery | Admitting: Cardiothoracic Surgery

## 2017-01-19 ENCOUNTER — Encounter (HOSPITAL_COMMUNITY): Payer: Self-pay

## 2017-01-19 DIAGNOSIS — I712 Thoracic aortic aneurysm, without rupture, unspecified: Secondary | ICD-10-CM

## 2017-01-19 DIAGNOSIS — R942 Abnormal results of pulmonary function studies: Secondary | ICD-10-CM | POA: Insufficient documentation

## 2017-01-19 DIAGNOSIS — Z87891 Personal history of nicotine dependence: Secondary | ICD-10-CM | POA: Insufficient documentation

## 2017-01-19 DIAGNOSIS — Z01818 Encounter for other preprocedural examination: Secondary | ICD-10-CM | POA: Insufficient documentation

## 2017-01-19 DIAGNOSIS — I7101 Dissection of thoracic aorta: Secondary | ICD-10-CM | POA: Insufficient documentation

## 2017-01-19 HISTORY — DX: Personal history of urinary calculi: Z87.442

## 2017-01-19 HISTORY — DX: Essential (primary) hypertension: I10

## 2017-01-19 LAB — BLOOD GAS, ARTERIAL
Acid-base deficit: 0.6 mmol/L (ref 0.0–2.0)
Bicarbonate: 23 mmol/L (ref 20.0–28.0)
Drawn by: 449841
FIO2: 21
O2 Saturation: 97.3 %
Patient temperature: 98.6
pCO2 arterial: 34.7 mmHg (ref 32.0–48.0)
pH, Arterial: 7.437 (ref 7.350–7.450)
pO2, Arterial: 92.1 mmHg (ref 83.0–108.0)

## 2017-01-19 LAB — PULMONARY FUNCTION TEST
DL/VA % pred: 105 %
DL/VA: 4.66 ml/min/mmHg/L
DLCO cor % pred: 73 %
DLCO cor: 20.81 ml/min/mmHg
DLCO unc % pred: 73 %
DLCO unc: 20.87 ml/min/mmHg
FEF 25-75 Post: 1.79 L/sec
FEF 25-75 Pre: 2.83 L/sec
FEF2575-%Change-Post: -36 %
FEF2575-%Pred-Post: 68 %
FEF2575-%Pred-Pre: 107 %
FEV1-%Change-Post: -7 %
FEV1-%Pred-Post: 91 %
FEV1-%Pred-Pre: 99 %
FEV1-Post: 2.54 L
FEV1-Pre: 2.75 L
FEV1FVC-%Change-Post: -4 %
FEV1FVC-%Pred-Pre: 102 %
FEV6-%Change-Post: -7 %
FEV6-%Pred-Post: 93 %
FEV6-%Pred-Pre: 100 %
FEV6-Post: 3.19 L
FEV6-Pre: 3.45 L
FEV6FVC-%Change-Post: 0 %
FEV6FVC-%Pred-Post: 104 %
FEV6FVC-%Pred-Pre: 104 %
FVC-%Change-Post: -3 %
FVC-%Pred-Post: 93 %
FVC-%Pred-Pre: 96 %
FVC-Post: 3.35 L
FVC-Pre: 3.45 L
Post FEV1/FVC ratio: 76 %
Post FEV6/FVC ratio: 100 %
Pre FEV1/FVC ratio: 80 %
Pre FEV6/FVC Ratio: 100 %
RV % pred: 117 %
RV: 2.45 L
TLC % pred: 89 %
TLC: 5.74 L

## 2017-01-19 LAB — CBC
HCT: 43.9 % (ref 39.0–52.0)
Hemoglobin: 14.8 g/dL (ref 13.0–17.0)
MCH: 28.6 pg (ref 26.0–34.0)
MCHC: 33.7 g/dL (ref 30.0–36.0)
MCV: 84.7 fL (ref 78.0–100.0)
Platelets: 181 10*3/uL (ref 150–400)
RBC: 5.18 MIL/uL (ref 4.22–5.81)
RDW: 14.6 % (ref 11.5–15.5)
WBC: 5 10*3/uL (ref 4.0–10.5)

## 2017-01-19 LAB — APTT: aPTT: 30 seconds (ref 24–36)

## 2017-01-19 LAB — COMPREHENSIVE METABOLIC PANEL
ALT: 16 U/L — ABNORMAL LOW (ref 17–63)
AST: 17 U/L (ref 15–41)
Albumin: 3.9 g/dL (ref 3.5–5.0)
Alkaline Phosphatase: 72 U/L (ref 38–126)
Anion gap: 10 (ref 5–15)
BUN: 17 mg/dL (ref 6–20)
CO2: 21 mmol/L — ABNORMAL LOW (ref 22–32)
Calcium: 9 mg/dL (ref 8.9–10.3)
Chloride: 106 mmol/L (ref 101–111)
Creatinine, Ser: 1.11 mg/dL (ref 0.61–1.24)
GFR calc Af Amer: >60 mL/min (ref >60)
GFR calc non Af Amer: >60 mL/min (ref >60)
Glucose, Bld: 86 mg/dL (ref 65–99)
Potassium: 4.2 mmol/L (ref 3.5–5.1)
Sodium: 137 mmol/L (ref 135–145)
Total Bilirubin: 1 mg/dL (ref 0.3–1.2)
Total Protein: 6.5 g/dL (ref 6.5–8.1)

## 2017-01-19 LAB — URINALYSIS, ROUTINE W REFLEX MICROSCOPIC
Bilirubin Urine: NEGATIVE
Glucose, UA: NEGATIVE mg/dL
Hgb urine dipstick: NEGATIVE
Ketones, ur: NEGATIVE mg/dL
Leukocytes, UA: NEGATIVE
Nitrite: NEGATIVE
Protein, ur: NEGATIVE mg/dL
Specific Gravity, Urine: 1.026 (ref 1.005–1.030)
pH: 5 (ref 5.0–8.0)

## 2017-01-19 LAB — PREPARE RBC (CROSSMATCH)

## 2017-01-19 LAB — SURGICAL PCR SCREEN
MRSA, PCR: NEGATIVE
STAPHYLOCOCCUS AUREUS: NEGATIVE

## 2017-01-19 LAB — HEMOGLOBIN A1C
Hgb A1c MFr Bld: 5.8 % — ABNORMAL HIGH (ref 4.8–5.6)
Mean Plasma Glucose: 119.76 mg/dL

## 2017-01-19 LAB — PROTIME-INR
INR: 1.13
Prothrombin Time: 14.4 seconds (ref 11.4–15.2)

## 2017-01-19 MED ORDER — ALBUTEROL SULFATE (2.5 MG/3ML) 0.083% IN NEBU
2.5000 mg | INHALATION_SOLUTION | Freq: Once | RESPIRATORY_TRACT | Status: AC
  Administered 2017-01-19: 2.5 mg via RESPIRATORY_TRACT

## 2017-01-19 NOTE — Progress Notes (Signed)
Pre-op Cardiac Surgery  Carotid Findings:  12/31/16 no hemodynamically significant stenosis [facility]. Imaging)  Upper Extremity Right Left  Brachial Pressures 134 130  Radial Waveforms Tri Tri  Ulnar Waveforms Tri Tri  Palmar Arch (Allen's Test) Decreases >50% with radial compression, normal with ulnar compression Decreases >50% with radial compression, normal with ulnar compression   Victor [name], RDMS, RVT 01/19/2017

## 2017-01-19 NOTE — Pre-Procedure Instructions (Addendum)
[facility]  01/19/2017      [facility] - [place], [place] - [address] [place] [address] Phone: [phone] Fax: [phone]  [facility] [facility] [address] - [place], [place] - [address] [facility] [place] [address] Phone: [phone] Fax: [phone]  [facility] - [place], [place] - [address] [place] [address] Phone: [phone] Fax: [phone]    Your procedure is scheduled on Friday, 01/21/2017.  Report to [facility] Admitting at 0530 A.M.  Call this number if you have problems the morning of surgery:  [phone]   Remember:  Do not eat food or drink liquids after midnight.   Continue all other medications as directed by your physician except follow these medication instructions before surgery   Take these medicines the morning of surgery with A SIP OF WATER:  Metoprolol tartrate (Lopressor)  Oxycodone-acetaminophen (Percocet) - if needed   Follow your doctors instructions regarding your Aspirin.  If no instructions were given by the doctor you will need to call the office to get instructions.  - Per Dr. Dennie Maizes office, DO NOT take your aspirin the morning of surgery.  7 days prior to surgery STOP taking any Aleve, Naproxen, Ibuprofen, Motrin, Advil, Goody's, BC's, all herbal medications, fish oil, and all vitamins    Do not wear jewelry  Do not wear lotions, powders, or colonges, or deodorant.  Men may shave face and neck.  Do not bring valuables to the hospital.  [facility] is not responsible for any belongings or valuables.  Contacts, dentures or bridgework may not be worn into surgery.  Leave your suitcase in the car.  After surgery it may be brought to your room.  For patients admitted to the hospital, discharge time will be determined by your treatment team.  Patients  discharged the day of surgery will not be allowed to drive home.   Name and phone number of your driver:    Special instructions:   [facility]- Preparing For Surgery  Before surgery, you can play an important role. Because skin is not sterile, your skin needs to be as free of germs as possible. You can reduce the number of germs on your skin by washing with CHG (chlorahexidine gluconate) Soap before surgery.  CHG is an antiseptic cleaner which kills germs and bonds with the skin to continue killing germs even after washing.  Please do not use if you have an allergy to CHG or antibacterial soaps. If your skin becomes reddened/irritated stop using the CHG.  Do not shave (including legs and underarms) for at least 48 hours prior to first CHG shower. It is OK to shave your face.  Please follow these instructions carefully.   1. Shower the NIGHT BEFORE SURGERY and the MORNING OF SURGERY with CHG.   2. If you chose to wash your hair, wash your hair first as usual with your normal shampoo.  3. After you shampoo, rinse your hair and body thoroughly to remove the shampoo.  4. Use CHG as you would any other liquid soap. You can apply CHG directly to the skin and wash gently with a scrungie or a clean washcloth.   5. Apply the CHG Soap to your body ONLY FROM THE NECK DOWN.  Do not use on open wounds or open sores. Avoid contact with your eyes, ears, mouth and genitals (private  parts). Wash genitals (private parts) with your normal soap.  6. Wash thoroughly, paying special attention to the area where your surgery will be performed.  7. Thoroughly rinse your body with warm water from the neck down.  8. DO NOT shower/wash with your normal soap after using and rinsing off the CHG Soap.  9. Pat yourself dry with a CLEAN TOWEL.   10. Wear CLEAN PAJAMAS   11. Place CLEAN SHEETS on your bed the night of your first shower and DO NOT SLEEP WITH PETS.    Day of Surgery: Shower as stated above Do  not apply any deodorants/lotions. Please wear clean clothes to the hospital/surgery center.      Please read over the following fact sheets that you were given. Pain Booklet, Coughing and Deep Breathing, MRSA Information and Surgical Site Infection Prevention and Incentive Spirometry

## 2017-01-20 ENCOUNTER — Ambulatory Visit (INDEPENDENT_AMBULATORY_CARE_PROVIDER_SITE_OTHER): Payer: Self-pay | Admitting: Cardiothoracic Surgery

## 2017-01-20 ENCOUNTER — Encounter: Payer: Self-pay | Admitting: Cardiothoracic Surgery

## 2017-01-20 VITALS — BP 131/87 | HR 87 | Resp 16 | Ht 67.0 in | Wt 168.0 lb

## 2017-01-20 DIAGNOSIS — Z09 Encounter for follow-up examination after completed treatment for conditions other than malignant neoplasm: Secondary | ICD-10-CM

## 2017-01-20 DIAGNOSIS — I712 Thoracic aortic aneurysm, without rupture, unspecified: Secondary | ICD-10-CM

## 2017-01-20 DIAGNOSIS — I71019 Dissection of thoracic aorta, unspecified: Secondary | ICD-10-CM

## 2017-01-20 DIAGNOSIS — I7101 Dissection of thoracic aorta: Secondary | ICD-10-CM

## 2017-01-20 MED ORDER — SODIUM CHLORIDE 0.9 % IV SOLN
INTRAVENOUS | Status: DC
  Filled 2017-01-20: qty 30

## 2017-01-20 MED ORDER — PHENYLEPHRINE HCL 10 MG/ML IJ SOLN
30.0000 ug/min | INTRAMUSCULAR | Status: DC
  Filled 2017-01-20: qty 2

## 2017-01-20 MED ORDER — VANCOMYCIN HCL 10 G IV SOLR
1250.0000 mg | INTRAVENOUS | Status: AC
  Administered 2017-01-21: 1250 mg via INTRAVENOUS
  Filled 2017-01-20: qty 1250

## 2017-01-20 MED ORDER — CEFUROXIME SODIUM 1.5 G IV SOLR
1.5000 g | INTRAVENOUS | Status: DC
  Filled 2017-01-20: qty 1.5

## 2017-01-20 MED ORDER — NITROGLYCERIN IN D5W 200-5 MCG/ML-% IV SOLN
2.0000 ug/min | INTRAVENOUS | Status: AC
  Administered 2017-01-21: 16.6 ug/min via INTRAVENOUS
  Filled 2017-01-20: qty 250

## 2017-01-20 MED ORDER — MAGNESIUM SULFATE 50 % IJ SOLN
40.0000 meq | INTRAMUSCULAR | Status: DC
  Filled 2017-01-20: qty 10

## 2017-01-20 MED ORDER — SODIUM CHLORIDE 0.9 % IV SOLN
INTRAVENOUS | Status: DC
  Filled 2017-01-20: qty 1

## 2017-01-20 MED ORDER — DOPAMINE-DEXTROSE 3.2-5 MG/ML-% IV SOLN
0.0000 ug/kg/min | INTRAVENOUS | Status: AC
  Administered 2017-01-21: 3 ug/kg/min via INTRAVENOUS
  Filled 2017-01-20: qty 250

## 2017-01-20 MED ORDER — PLASMA-LYTE 148 IV SOLN
INTRAVENOUS | Status: DC
  Filled 2017-01-20: qty 2.5

## 2017-01-20 MED ORDER — TRANEXAMIC ACID (OHS) BOLUS VIA INFUSION
15.0000 mg/kg | INTRAVENOUS | Status: AC
  Administered 2017-01-21: 1116 mg via INTRAVENOUS
  Filled 2017-01-20: qty 1116

## 2017-01-20 MED ORDER — CEFUROXIME SODIUM 750 MG IJ SOLR
750.0000 mg | INTRAMUSCULAR | Status: DC
Start: 2017-01-21 — End: 2017-01-21
  Filled 2017-01-20: qty 750

## 2017-01-20 MED ORDER — DEXTROSE 5 % IV SOLN
1.5000 g | INTRAVENOUS | Status: AC
  Administered 2017-01-21: 1.5 g via INTRAVENOUS
  Administered 2017-01-21: .75 g via INTRAVENOUS
  Filled 2017-01-20: qty 1.5

## 2017-01-20 MED ORDER — TRANEXAMIC ACID (OHS) PUMP PRIME SOLUTION
2.0000 mg/kg | INTRAVENOUS | Status: DC
  Filled 2017-01-20: qty 1.49

## 2017-01-20 MED ORDER — DEXMEDETOMIDINE HCL IN NACL 400 MCG/100ML IV SOLN
0.1000 ug/kg/h | INTRAVENOUS | Status: DC
Start: 2017-01-21 — End: 2017-01-21
  Filled 2017-01-20: qty 100

## 2017-01-20 MED ORDER — POTASSIUM CHLORIDE 2 MEQ/ML IV SOLN
80.0000 meq | INTRAVENOUS | Status: DC
  Filled 2017-01-20: qty 40

## 2017-01-20 MED ORDER — EPINEPHRINE PF 1 MG/ML IJ SOLN
0.0000 ug/min | INTRAVENOUS | Status: DC
  Filled 2017-01-20: qty 4

## 2017-01-20 MED ORDER — TRANEXAMIC ACID 1000 MG/10ML IV SOLN
1.5000 mg/kg/h | INTRAVENOUS | Status: DC
  Filled 2017-01-20: qty 25

## 2017-01-20 NOTE — Progress Notes (Signed)
[address]       [place] [address]             [phone]                    [name] [facility] Medical Record #[id number] Date of Birth: 1955-07-12  Referring: Chrystie Nose, MD Primary Care: Billee Cashing, MD  Chief Complaint:    Chief Complaint  Patient presents with  . Follow-up    presurgery visit with CATH 9/18/PFT 9/26   10/06/2014 OPERATIVE REPORT PREOPERATIVE DIAGNOSIS: Acute type 1 aortic dissection originating just at the takeoff of the right coronary ostium extending into the right external iliac artery. 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 SURGICAL PROCEDURE: Repair of ascending aortic dissection with replacement of ascending aorta and resuspension of the aortic valve with cardiopulmonary bypass and hypothermic circulatory arrest. SURGEON: Sheliah Plane, MD  History of Present Illness:    Victor [name] 61 y.o. male is seen in the office  today for follow-up after repair of acute type I aortic dissection and subsequent need for femorofemoral bypass because of ischemic right leg in the early postoperative period. Since surgery the patient has stopped smoking. He has not had his large inguinal hernia repaired yet.    Since last seen he has consulted with vascular surgery about a combined the branching of the aortic arch and placement of thoracic aortic stent graft. Cardiac cath has been done without significant coronary artery disease.  Current Activity/ Functional Status:  Patient is independent with mobility/ambulation, transfers, ADL's, IADL's.   Zubrod Score: At the time of surgery this patient's most appropriate activity status/level should be described as: []     0    Normal activity, no symptoms [x]     1    Restricted in physical strenuous activity but ambulatory, able to do out  light work []     2    Ambulatory and capable of self care, unable to do work activities, up and about               >50 % of waking hours                              []     3    Only limited self care, in bed greater than 50% of waking hours []     4    Completely disabled, no self care, confined to bed or chair []     5    Moribund   Past Medical History:  Diagnosis Date  . History of kidney stones   . Hypertension   . Thoracic aortic aneurysm (HCC) 09/2014    Past Surgical History:  Procedure Laterality Date  . CARDIAC CATHETERIZATION    . FEMORAL-FEMORAL BYPASS GRAFT N/A 10/07/2014   Procedure: LEFT  FEMORAL ARTERY -RIGHT FEMORAL ARTERY BYPASS GRAFT USING X 30 CM HEMASHIELD GOLD GRAFT;  Surgeon: Larina Earthly, MD;  Location: [facility] OR;  Service: Vascular;  Laterality: N/A;  . RIGHT/LEFT HEART CATH AND CORONARY ANGIOGRAPHY N/A 01/11/2017   Procedure: RIGHT/LEFT HEART CATH AND CORONARY ANGIOGRAPHY- Diagnostic Only;  Surgeon: [name], MD;  Location: [facility] INVASIVE CV LAB;  Service: Cardiovascular;  Laterality: N/A;  . THORACIC AORTIC ANEURYSM REPAIR N/A 10/06/2014   Procedure: THORACIC ASCENDING  ANEURYSM REPAIR (AAA);  Surgeon: [name], MD;  Location: [facility] OR;  Service: Open Heart Surgery;  Laterality: N/A;  hyportermia circulatory arrest and resuspension of aortic valve    Family History  Problem Relation Age of Onset  . Heart murmur Mother     Social History   Social History  . Marital status: Single    Spouse name: N/A  . Number of children: N/A  . Years of education: N/A   Occupational History  . Not on file.   Social History Main Topics  . Smoking status: Former Smoker    Years: 35.00    Types: Cigarettes, Cigars    Quit date: 10/07/2014  . Smokeless tobacco: Never Used  . Alcohol use No  . Drug use: No  . Sexual activity: Not on file   Other Topics Concern  . Not on file   Social History Narrative  . No narrative on file    History  Smoking Status   . Former Smoker  . Years: 35.00  . Types: Cigarettes, Cigars  . Quit date: 10/07/2014  Smokeless Tobacco  . Never Used    History  Alcohol Use No     No Known Allergies  Current Outpatient Prescriptions  Medication Sig Dispense Refill  . aspirin EC 325 MG EC tablet Take 1 tablet (325 mg total) by mouth daily.    . metoprolol tartrate (LOPRESSOR) 25 MG tablet take 1 tablet by mouth twice a day 180 tablet 0  . oxyCODONE-acetaminophen (PERCOCET) 10-325 MG tablet Take 1 tablet by mouth every 8 (eight) hours as needed for pain.     No current facility-administered medications for this visit.    Facility-Administered Medications Ordered in Other Visits  Medication Dose Route Frequency Provider Last Rate Last Dose  . [START ON 01/21/2017] cefUROXime (ZINACEF) 1.5 g in dextrose 5 % 50 mL IVPB  1.5 g Intravenous To OR [name], MD      . Melene Muller ON 01/21/2017] cefUROXime (ZINACEF) 1.5 g in dextrose 5 % 50 mL IVPB  1.5 g Intravenous 30 min Pre-Op [name], MD      . Melene Muller ON 01/21/2017] dexmedetomidine (PRECEDEX) 400 MCG/100ML (4 mcg/mL) infusion  0.1-0.7 mcg/kg/hr Intravenous To OR [name], MD      . Melene Muller ON 01/21/2017] DOPamine (INTROPIN) 800 mg in dextrose 5 % 250 mL (3.2 mg/mL) infusion  0-10 mcg/kg/min Intravenous To OR [name], MD      . Melene Muller ON 01/21/2017] EPINEPHrine (ADRENALIN) 4 mg in dextrose 5 % 250 mL (0.016 mg/mL) infusion  0-10 mcg/min Intravenous To OR [name], MD      . Melene Muller ON 01/21/2017] heparin 30,000 units/NS 1000 mL solution for CELLSAVER   Other To OR [name], MD      . Melene Muller ON 01/21/2017] insulin regular (NOVOLIN R,HUMULIN R) 100 Units in sodium chloride 0.9 % 100 mL (1 Units/mL) infusion   Intravenous To OR [name], MD      . Melene Muller ON 01/21/2017] magnesium sulfate (IV Push/IM) injection 40 mEq  40 mEq Other To OR [name], MD      . Melene Muller ON 01/21/2017] nitroGLYCERIN 50 mg in dextrose  5 % 250 mL (0.2 mg/mL) infusion  2-200 mcg/min Intravenous To OR [name], MD      . Melene Muller ON 01/21/2017] phenylephrine (NEO-SYNEPHRINE) 20 mg in sodium chloride 0.9 % 250 mL (0.08 mg/mL) infusion  30-200 mcg/min Intravenous To  OR [name], MD      . Melene Muller ON 01/21/2017] potassium chloride injection 80 mEq  80 mEq Other To OR [name], MD      . Melene Muller ON 01/21/2017] tranexamic acid (CYKLOKAPRON) 2,500 mg in sodium chloride 0.9 % 250 mL (10 mg/mL) infusion  1.5 mg/kg/hr Intravenous To OR [name], MD      . Melene Muller ON 01/21/2017] tranexamic acid (CYKLOKAPRON) bolus via infusion - over 30 minutes 1,116 mg  15 mg/kg Intravenous To OR [name], MD      . Melene Muller ON 01/21/2017] tranexamic acid (CYKLOKAPRON) pump prime solution 149 mg  2 mg/kg Intracatheter To OR [name], MD      . Melene Muller ON 01/21/2017] vancomycin (VANCOCIN) 1,250 mg in sodium chloride 0.9 % 250 mL IVPB  1,250 mg Intravenous To OR [name], MD          Review of Systems:     Cardiac Review of Systems: Y or N  Chest Pain [  n  ]  Resting SOB [  n ] Exertional SOB  [n Orthopnea [  n]   Pedal Edema [n   ]    Palpitations Milo.Brash  ] Syncope  [ n ]   Presyncope [ n  ]  General Review of Systems: [Y] = yes [  ]=no Constitional: recent weight change [  ];  Wt loss over the last 3 months [   ] anorexia [  ]; fatigue [n]; nausea [  ]; night sweats [  ]; fever [  ]; or chills [  ];          Dental: poor dentition[  ]; Last Dentist visit:   Eye : blurred vision [n  ]; diplopia Milo.Brash   ]; vision changes [ n ];  Amaurosis fugax[n  ]; Resp: cough [  ];  wheezing[  ];  hemoptysis[  ]; shortness of breath[  ]; paroxysmal nocturnal dyspnea[ n ]; dyspnea on exertion[ n ]; or orthopnea[  ];  GI:  gallstones[  ], vomiting[  ];  dysphagia[  ]; melena[  ];  hematochezia [  ]; heartburn[  ];   Hx of  Colonoscopy[  ]; GU: kidney stones [  ]; hematuria[ n ];   dysuria [ n ];  nocturia[  ];  history of      obstruction [  ]; urinary frequency [  ]             Skin: rash, swelling[  ];, hair loss[  ];  peripheral edema[  ];  or itching[  ]; Musculosketetal: myalgias[ n ];  joint swelling[ n ];  joint erythema[n  ];  joint pain[  ];  back pain[n  ];  Heme/Lymph: bruising[  ];  bleeding[  ];  anemia[  ];  Neuro: TIA[  ];  headaches[  ];  stroke[  ];  vertigo[  ];  seizures[  ];   paresthesias[n  ];  difficulty walking[ n ];  Psych:depression[  ]; anxiety[  ];  Endocrine: diabetes[  ];  thyroid dysfunction[  ];  Immunizations: Flu up to date [ n ]; Pneumococcal up to date Milo.Brash  ];  Other:  Physical Exam: BP 131/87 (BP Location: Right Arm, Patient Position: Sitting, Cuff Size: Large)   Pulse 87   Resp 16   Ht 5\' 7"  (1.702 m)   Wt 168 lb (76.2 kg)   SpO2 94% Comment: ON RA  BMI  26.31 kg/m   PHYSICAL EXAMINATION: Physical Exam  Constitutional: He is oriented to person, place, and time. He appears well-nourished. No distress.  HENT:  Mouth/Throat: No oropharyngeal exudate.  Eyes: Left eye exhibits no discharge. No scleral icterus.  Neck: No JVD present. No tracheal deviation present. No thyromegaly present.  Cardiovascular: Normal rate, regular rhythm, normal heart sounds and intact distal pulses.  Exam reveals no gallop and no friction rub.   No murmur heard. Respiratory: Effort normal and breath sounds normal. No stridor. No respiratory distress. He has no wheezes. He has no rales. He exhibits no tenderness.  GI: Soft. Bowel sounds are normal. He exhibits no distension. There is no tenderness. There is no rebound and no guarding.  Musculoskeletal: He exhibits no edema, tenderness or deformity.  Lymphadenopathy:    He has no cervical adenopathy.  Neurological: He is alert and oriented to person, place, and time.  Skin: Skin is warm and dry. He is not diaphoretic.  Psychiatric: He has a normal mood and affect. His behavior is normal. Judgment and thought content normal.  Large left  inguinal hernia, palpable 1+ DP and PT pulses at the right ankle 2+ DP and PT pulses at the left ankle, he has bilateral femoral pulses but no palpable pulse in the femorofemoral crossover graft Incision in the right infraclavicular area from previous right axillary artery cannulation   Diagnostic Studies & Laboratory data:     Recent Radiology Findings:  Mr Abdomen Wwo Contrast  Result Date: 01/06/2017 CLINICAL DATA:  Renal lesion on recent CT scan. EXAM: MRI ABDOMEN WITHOUT AND WITH CONTRAST TECHNIQUE: Multiplanar multisequence MR imaging of the abdomen was performed both before and after the administration of intravenous contrast. CONTRAST:  15mL MULTIHANCE GADOBENATE DIMEGLUMINE 529 MG/ML IV SOLN COMPARISON:  CTA from 12/09/2016 FINDINGS: Lower chest:  No evidence for pleural effusion. Hepatobiliary: Liver unremarkable although incompletely evaluated on this dedicated renal study. There is no evidence for gallstones, gallbladder wall thickening, or pericholecystic fluid. No intrahepatic or extrahepatic biliary dilation. Pancreas: No focal mass lesion. No dilatation of the main duct. No intraparenchymal cyst. No peripancreatic edema. Spleen: Visualized portions of the spleen are unremarkable. Adrenals/Urinary Tract: No adrenal nodule or mass. Right renal lesion of concern on prior CTA is identified towards the lower pole the right kidney. This 2.0 x 1.8 cm T1 hyperintense, T2 variable intensity, partially septated lesion shows layering debris or hemorrhage. No substantial enhancement within the lesion although the most delayed postcontrast subtraction imaging suggests possible small enhancing nodules (image 34 series 19 and image 37 series 19). Scattered small simple cysts are identified in the left kidney. No enhancing left renal mass. Stomach/Bowel: Stomach is nondistended. No gastric wall thickening. No evidence of outlet obstruction. Duodenum is normally positioned as is the ligament of Treitz. No  small bowel or colonic dilatation within the visualized abdomen. Vascular/Lymphatic: Thoracolumbar aortic dissection better characterized on the recent CTA exam. Other: No intraperitoneal free fluid. Musculoskeletal: No abnormal marrow signal within the visualized bony anatomy. IMPRESSION: 1. 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 2. Small simple cysts (Bosniak I) in the left kidney. 3. Thoracoabdominal aortic dissection as better characterized on recent CTA. Electronically Signed   By: Kennith Center M.D.   On: 01/06/2017 08:44    Result Date: 12/09/2016 CLINICAL DATA:  61 year old male with chronic aortic dissection. EXAM: CT ANGIOGRAPHY CHEST, ABDOMEN AND PELVIS TECHNIQUE: Multidetector CT imaging through the chest, abdomen and pelvis was performed  using the standard protocol during bolus administration of intravenous contrast. Multiplanar reconstructed images and MIPs were obtained and reviewed to evaluate the vascular anatomy. Creatinine was obtained on site at [facility] at [address] Results: Creatinine 1.2 mg/dL. CONTRAST:  75 mL Isovue 370 COMPARISON:  Most recent prior chest CTA 05/27/2016 ; most recent prior CT scan of the chest, abdomen and pelvis 04/10/2015 FINDINGS: CTA CHEST FINDINGS Cardiovascular: Surgical changes of prior operative repair of the ascending portion of the known Stanford a thoracic aortic aneurysm. The tube graft remains unchanged in appearance. The dissection flap begins immediately beyond the distal anastomosis and extends throughout the thoracic aorta and into the abdominal aorta. Progressive aneurysmal dilatation of the aortic infundibulum now measuring 5.6 cm compared to 5.3 cm previously (confirmed on coronal reformatted images). The aneurysmal descending thoracic aorta remains unchanged in caliber measuring 5.5 cm at the level of the origin of the main pulmonary artery and 4.5 cm just proximal to the aortic  hiatus. The arch vessels remain tortuous. The dissection flap does not extend into the vessels. Main pulmonary artery remains normal in size. The heart remains normal in size. No pericardial effusion. Mediastinum/Nodes: Similar appearance of heterogeneous multinodular thyroid gland. Mildly prominent right para-aortic lymph nodes again noted without significant change compared to 2016. These are likely reactive. No new adenopathy or mediastinal mass. Unremarkable thoracic esophagus. Lungs/Pleura: Paraseptal pulmonary emphysema with bullous change at the right lung apex. No evidence of pleural effusion, focal airspace consolidation, pulmonary edema or pneumothorax. Respiratory motion artifact noted in the lower lungs bilaterally. No suspicious nodule or mass. Musculoskeletal: No acute fracture or aggressive appearing lytic or blastic osseous lesion. Healed median sternotomy. Review of the MIP images confirms the above findings. CTA ABDOMEN AND PELVIS FINDINGS VASCULAR Aorta: Chronic dissection of the the abdominal aorta again noted. The dissection flap extends into the infrarenal abdominal aorta and terminates proximal to the inferior mesenteric artery. There has been some remodeling of the intramural hematoma in the distal aorta compared to December of 2016. The visceral aorta remains mildly aneurysmal at 4.7 cm which is unchanged compared to prior. The remainder of the aorta remains within normal limits in size. Celiac: Fenestration of the dissection flap at the origin of the celiac artery. Thus, the celiac artery arises from both the true and false lumen. Widely patent. No aneurysm. SMA: Arises from the true lumen. Widely patent. No aneurysm or dissection. Renals: Fenestration at the level of the renal arteries providing cross flow. The right renal artery arises from the false lumen while the left renal artery arises from the true lumen. The renal arteries are normal in caliber. No evidence of dissection or  fibromuscular dysplasia. IMA: Patent and unremarkable. Inflow: Relatively spared from disease.  Widely patent. Outflow: Ectasia of the bilateral common femoral artery is likely at sites of prior endarterectomy. There is a completely excluded fem-fem bypass graft. The visualized portions of the superficial and profunda femoral arteries are widely patent. Veins: No obvious venous abnormality within the limitations of this arterial phase study. Review of the MIP images confirms the above findings. NON-VASCULAR Hepatobiliary: Normal hepatic contour and morphology. No discrete hepatic lesions. Normal appearance of the gallbladder. No intra or extrahepatic biliary ductal dilatation. Pancreas: Unremarkable. No pancreatic ductal dilatation or surrounding inflammatory changes. Spleen: Normal in size without focal abnormality. Adrenals/Urinary Tract: Normal adrenal glands. Differential enhancement. There is relatively decreased enhancement of the right renal parenchyma. No evidence of hydronephrosis or nephrolithiasis. 2.2 cm intermediate attenuation  in the lower pole of the right kidney has slowly grown in size compared to the initial CT scan from June of 2016 at which time it measured 1.6 cm. Small low-attenuation cyst exophytic from the upper pole of the left kidney remains unchanged and is likely a simple cyst. Unremarkable ureters and bladder. Stomach/Bowel: No focal bowel wall thickening or evidence of obstruction. Large left inguinal hernia containing multiple loops of small bowel and mesenteric. Normal appendix in the right lower quadrant. Lymphatic: No suspicious lymphadenopathy. Reproductive: Prostate is unremarkable. Other: Large left inguinal hernia.  Negative for ascites. Musculoskeletal: No acute fracture or aggressive appearing lytic or blastic osseous lesion. Right-sided facet arthropathy at L5-S1. Review of the MIP images confirms the above findings. IMPRESSION: CTA CHEST 1. Enlarging chronic aneurysmal  thoracic aortic dissection. The aortic infundibulum now measures 5.6 cm compared to 5.3 cm on 05/27/2016 and 4.1 cm at the time of presentation in June of 2016. No evidence of propagation of the dissection flap. 2. Prior surgical repair of Stanford type A ascending thoracic aortic aneurysm with stable appearance of the ascending aortic tube graft. 3. Stable chronic aneurysmal dissection of the descending thoracic aorta. 4. Paraseptal emphysema with bullous change in the right lung apex. CTA ABD/PELVIS 1. Slight interval remodeling of the distal extent of the abdominal aortic dissection. Previously, the dissection flap extended more inferiorly as a thrombosed false lumen/intramural hematoma. The intramural hematoma component has resorbed over time and there is less overall dilatation of the distal abdominal aorta in the region of the inferior mesenteric artery. Otherwise, stable dissection flap and ectasia of the super renal and juxtarenal abdominal aorta. No progression of aneurysmal dilatation or propagation of the dissection flap. 2. Enlarging intermediate attenuation lesion within the lower pole of the right kidney. This remains incompletely evaluated having not been included on prior noncontrast CT images. While it is possible that this represents a slowly enlarging complex cyst, a low grade renal neoplasm such as a papillary cell renal carcinoma is also a distinct possibility. Recommend further evaluation with renal protocol MRI of the abdomen with gadolinium contrast. 3. Persistent differential enhancement of the kidneys with relatively decreased enhancement of the right kidney (the right renal artery arises from the false lumen). No evidence of renal cortical atrophy. 4. Stable appearance of large left inguinal hernia containing multiple loops of small bowel and associated mesentery. 5. Additional ancillary findings as above without significant interval change. These results were called by telephone at the time  of interpretation on 12/09/2016 at 12:14 pm to Dr. Sheliah Plane , who verbally acknowledged these results. Signed, Sterling Big, MD [facility] Radiology Electronically Signed   By: Malachy Moan M.D.   On: 12/09/2016 12:14   I have independently reviewed the above radiology studies  and reviewed the findings with the patient.   Ct Angio Chest Aorta W/cm &/or Wo/cm  Result Date: 05/27/2016 CLINICAL DATA:  61 year old male with history of thoracic aortic dissection status post surgical repair in February 2016. Followup study. EXAM: CT ANGIOGRAPHY CHEST WITH CONTRAST TECHNIQUE: Multidetector CT imaging of the chest was performed using the standard protocol during bolus administration of intravenous contrast. Multiplanar CT image reconstructions and MIPs were obtained to evaluate the vascular anatomy. CONTRAST:  100 mL of Isovue 370. COMPARISON:  Chest CT 08/28/2015. FINDINGS: Cardiovascular: Heart size is normal. There is no significant pericardial fluid, thickening or pericardial calcification. There is aortic atherosclerosis, as well as atherosclerosis of the great vessels of  the mediastinum and the coronary arteries, including calcified atherosclerotic plaque in the left anterior descending and right coronary arteries. Status post median sternotomy for graft replacement of the ascending thoracic aorta. The proximal and distal anastomoses of the graft are intact. No abnormal fluid collection around the graft. Immediately distal to the distal anastomosis there is an aortic dissection, which extends into the abdominal aorta (below the lower margin of the images). The dissection flap does not extend into the great vessels. The dissection flap over rides the origin of the celiac axis which appears to be fed by the false lumen. Aneurysmal dilatation of the distal aortic arch which measures up to 5.3 cm in diameter. There is also aneurysmal dilatation of  the descending thoracic aorta which measures up to 4.2 x 4.7 cm (mean diameter of 4.5 cm). On the precontrast images there is no crescentic high attenuation associated with the wall of the thoracic aorta to suggest acute intramural hemorrhage. Mediastinum/Nodes: Multiple borderline enlarged and mildly enlarged mediastinal lymph nodes measuring up to 1 cm in short axis in the right paratracheal nodal stations, similar to the prior study. Esophagus is unremarkable in appearance. No axillary lymphadenopathy. Lungs/Pleura: New 3 x 5 mm (mean diameter 4 mm) pulmonary nodule in the right middle lobe (image 103 of series 14). No other larger more suspicious appearing pulmonary nodules or masses are noted. Scattered areas of mild linear scarring are noted throughout the lung bases. No acute consolidative airspace disease. No pleural effusions. Moderate paraseptal emphysema most notable in the lung apices. Upper Abdomen: Unremarkable. Musculoskeletal: Median sternotomy wires. There are no aggressive appearing lytic or blastic lesions noted in the visualized portions of the skeleton. Review of the MIP images confirms the above findings. IMPRESSION: 1. Chronic type A thoracic aortic dissection status post graft repair of the ascending thoracic aorta, with persistent aneurysmal dilatation of the distal aortic arch (5.3 cm in diameter) and the descending thoracic aorta (mean diameter 4.5 cm), as above. No acute findings. 2. 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 3. Moderate paraseptal emphysema. 4.  2 vessel coronary artery disease. Electronically Signed   By: Trudie Reed M.D.   On: 05/27/2016 12:21   Ct Angio Chest Aorta W/cm &/or Wo/cm  08/28/2015  CLINICAL DATA:  Followup thoracic aneurysm repair EXAM: CT ANGIOGRAPHY CHEST WITH CONTRAST TECHNIQUE: Multidetector CT imaging of the chest was performed using the standard  protocol during bolus administration of intravenous contrast. Multiplanar CT image reconstructions and MIPs were obtained to evaluate the vascular anatomy. CONTRAST:  75 mL Isovue 370 COMPARISON:  04/10/2015 FINDINGS: Lungs are well aerated bilaterally. Diffuse emphysematous changes are seen. A 6 mm nodule is again noted in the right middle lobe best seen on image number 80 of series 5. Thoracic inlet again demonstrates the thyroid to be somewhat enlarged in size. Previously seen left thyroid nodule is again noted and stable. There again noted changes consistent with ascending aortic repair. Persistent dissection flap is noted in the mid thoracic arch extending into the descending thoracic aorta. This is stable in appearance from the prior exam. The false lumen occupies the majority of the descending thoracic aorta. The brachiocephalic vessels are supplied by the true lumen. As is the celiac axis and superior mesenteric artery. The overall appearance of the aorta is stable from the prior exam. No new focal abnormality is seen. The pulmonary artery as visualized is within normal limits. No hilar or mediastinal  adenopathy is noted. The visualized upper abdomen demonstrates decreased perfusion of the right kidney similar to that seen on the prior exam likely related to supplied from the false lumen. The osseous structures show no acute abnormality. Review of the MIP images confirms the above findings. IMPRESSION: Emphysematous changes. Stable 6 mm nodule in the right middle lobe. Stable changes of ascending aortic repair. There is a persistent dissection flap extending from the aortic arch to the descending aorta. This is stable from the prior exam. No new focal abnormality is noted. Electronically Signed   By: Alcide Clever M.D.   On: 08/28/2015 08:51    Ct Angio Chest Aorta W/cm &/or Wo/cm  04/10/2015  CLINICAL DATA:  History of dissection abdominal aortic aneurysm. Former smoker. EXAM: CT ANGIOGRAPHY CHEST,  ABDOMEN AND PELVIS TECHNIQUE: Multidetector CT imaging through the chest, abdomen and pelvis was performed using the standard protocol during bolus administration of intravenous contrast. Multiplanar reconstructed images and MIPs were obtained and reviewed to evaluate the vascular anatomy. CONTRAST:  75 cc SVC 70 COMPARISON:  CT the chest, abdomen pelvis - 10/06/2014 FINDINGS: CTA CHEST FINDINGS Vascular Findings: The patient has undergone open repair of the ascending thoracic aorta. The bypass graft is widely patent. There is a minimal amount of ill-defined stranding within the anterior mediastinum which is favored to be postoperative. No contrast extravasation. Review of the precontrast images are negative for the presence of an intramural hematoma. There is a persistent dissection involving the cranial most aspect of the ascending thoracic aorta extending through the aortic arch and the descending thoracic aorta to the level of the mid/distal aspect of the abdominal aorta. Both the true and false lumens of the abdominal aortic dissection remain widely patent through the level of the descending thoracic aorta. There has been interval enlargement of the aortic arch and descending thoracic aorta. The aortic arch now measures approximately 48 mm in diameter (previously, 34 mm) while the distal descending thoracic aorta now measures approximately 37 mm, previously, 32 mm. No evidence of periaortic stranding. Normal heart size. No pericardial effusion. Although this examination was not tailored for the evaluation the pulmonary arteries, there are no discrete filling defects within the central pulmonary arterial tree to suggest central pulmonary embolism. ------------------------------------------------------------- Thoracic aortic measurements: Sinotubular junction 30 mm measured in greatest oblique coronal dimension. Proximal ascending aorta The bypass graft measures approximately 35 mm in greatest oblique short axis  diameter as measured in greatest oblique axial dimension at the level of the main pulmonary artery. Aortic arch aorta 48 mm as measured in greatest oblique sagittal dimension, previously 34 mm, an approximately 46 mm in greatest oblique short axis axial diameter (image 36, series 4), previously, 35 mm. Proximal descending thoracic aorta 41 mm as measured in greatest oblique axial dimension at the level of the main pulmonary artery, previously, 36 mm Distal descending thoracic aorta 37 mm as measured in greatest oblique axial dimension at the level of the diaphragmatic hiatus, previously 32 mm Review of the MIP images confirms the above findings. ------------------------------------------------------------- Non-Vascular Findings: Minimal dependent subpleural ground-glass atelectasis, left greater than right. No discrete focal airspace opacities. No pleural effusion or pneumothorax. There is minimal subsegmental atelectasis along the right minor fissure. Mild apical predominant paraseptal and centrilobular emphysematous change, right greater than left. Punctate (approximately 0.6 cm) noncalcified ground-glass nodule within in the right middle lobe (image 39, series 5), unchanged since the 09/2010/2016 examination. Scattered prominent mediastinal lymph nodes with index right sided precarinal lymph node measuring 1.1  cm in greatest short axis diameter (image 45, series 4). Additional scattered mediastinal lymph nodes are numerous though individually not enlarged by size criteria with index precarinal lymph node measuring 0.8 cm in greatest short axis diameter, likely reactive in etiology. No hilar axillary lymphadenopathy. Post median sternotomy. No acute or aggressive osseous abnormalities. There is diffuse heterogeneity of the thyroid gland with a suspected punctate (approximately 0.9 cm) hypo attenuating nodule within the left lobe of the thyroid (image 15, series 4). Review of the MIP images confirms the above  findings. --------------------------------------------------------------------------------- CTA ABDOMEN AND PELVIS FINDINGS Vascular Findings: Abdominal aorta: The thoracic aortic dissection extends through the mid/ distal aspects of the abdominal aorta. The false lumen of the thoracic aortic dissection is again noted to supply the right renal artery as well as the majority of supplied to the celiac artery. While there is expected slightly delayed enhancement of the right kidney in relation to the left, there is no evidence of end organ ischemic change. The dissection is again noted to abut but not extend into the origin of the celiac artery as well as the SMA. Interval increase in size of the abdominal aorta now measuring 3 cm in diameter (image 147, series 4), previously, 2.3 cm, an approximately 3 cm in greatest oblique coronal short axis diameter (image 69, series 601), previously, 2.4 cm. The dissection terminates at the level just caudal to the take-off of the IMA. The distal abdominal aorta is widely patent as is the previously thrombosed right common and external iliac arteries. Celiac artery: As above, the dissection extends to abut the origin of the celiac artery though not extend into the vessel and not definitely result in hemodynamically significant stenosis. The supply to the celiac artery is primarily via the false lumen. Conventional branching pattern. SMA: Supplied via the true lumen. Conventional branching pattern. The distal tributaries the SMA are widely patent without discrete intraluminal filling defect to suggest distal embolism. Right Renal artery: Solitary; supplied by the false lumen. Widely patent without hemodynamically significant narrowing. No vessel irregularity to suggest FMD. Left Renal artery: Duplicated ; both right-sided renal arteries are supplied by the true lumen. Both co-dominant left-sided renal arteries are widely patent without hemodynamically significant narrowing. No  vessel irregularity to suggest FMD. IMA: Supplied by the true lumen. Widely patent without hemodynamically significant narrowing. Pelvic vasculature: As above, there is no longer extension of the thoracic aortic dissection thrombus to involve the right common and external iliac arteries. These vessels now remain widely patent. The bilateral external and internal iliac arteries are widely patent without hemodynamically significant narrowing. There is a thrombosed fem fem bypass graft with mild aneurysmal dilatation adjacent to the anastomosis. The bilateral deep and superficial femoral arteries are widely patent throughout their imaged course. Review of the MIP images confirms the above findings. -------------------------------------------------------------------------------- Nonvascular Findings: Evaluation of the abdominal organs is largely limited to the arterial phase of enhancement. Normal hepatic contour. No discrete hyper enhancing hepatic lesions. Normal noncontrast appearance of the gallbladder. No radiopaque gallstones. No intra or extrahepatic biliary duct dilatation. No ascites. There is slightly delayed enhancement of the right kidney in comparison to the left secondary to the right kidney being supplied via the false lumen of the abdominal aortic dissection. No renal stones. No urinary obstruction or perinephric stranding. Normal appearance of the bilateral adrenal glands, pancreas and spleen. Large left-sided indirect inguinal hernia which is noted to again contain multiple loops of nondilated small bowel, not resulting in enteric obstruction. Moderate to  large colonic stool burden. The bowel is otherwise normal in course and caliber without wall thickening. Normal appearance of the terminal ileum and appendix. No pneumoperitoneum, pneumatosis or portal venous gas. No bulky retroperitoneal, mesenteric, pelvic or inguinal lymphadenopathy. Scattered calcifications within normal sized prostate gland.  Normal appearance of the urinary bladder given degree distention. No free fluid in the pelvic cul-de-sac. No acute or aggressive osseous abnormalities within the abdomen or pelvis. Incidental note is made of a nondisplaced left-sided L5 pars defects without associated anterolisthesis. Regional soft tissues appear otherwise normal. Review of the MIP images confirms the above findings. IMPRESSION: Vascular Impression of the chest: 1. Post open repair of the ascending thoracic aorta without evidence of complication. 2. Persistent findings of a type A dissection originating immediately adjacent to the distal end of the open bypass graft. 3. Interval enlargement of the aortic arch and descending thoracic aorta (aortic arch now measures 48 mm in diameter, previously, 34 mm). No periaortic stranding. Nonvascular Impression of the chest: 1. No acute cardiopulmonary disease. 2. Moderate centrilobular and paraseptal emphysematous change. 3. Punctate (approximately 0.5 cm) nodule within the right middle lobe is unchanged since the 09/2014 examination. Given risk factors for bronchogenic carcinoma, follow-up chest CT at 6 - 12 months is recommended. This recommendation follows the consensus statement: Guidelines for Management of Small Pulmonary Nodules Detected on CT Scans: A Statement from the Fleischner Society as published in Radiology 2005;237:395-400. 4. Punctate (approximately 0.9 cm) hypoattenuating nodule with the left lobe of the thyroid. Further evaluation with dedicated thyroid ultrasound could be performed as clinically indicated. ----------------------------------------------------------------------------------- Vascular Impression of the abdomen and pelvis: 1. There is persistent extension of the thoracic aortic dissection now terminating at the level of the mid/distal abdominal aorta. The dissection is again noted to abut the origin of both the celiac and SMA though not extend into either vessel. The right renal  artery as well as the majority of the celiac artery are supplied via the false lumen. 2. Interval increase in size of the abdominal aorta now measuring 3 cm in diameter, previously, 2.3 cm. 3. The dissection no longer extends to involve the right common iliac artery. Both the right common and external iliac arteries are now appear widely patent. As such, the fem-fem bypass graft is occluded. Nonvascular Impression of the abdomen and pelvis: 1. Grossly unchanged large left-sided indirect inguinal hernia which is again noted to contain multiple loops of nondilated small bowel. Electronically Signed   By: Simonne Come M.D.   On: 04/10/2015 11:51    Recent Lab Findings: Lab Results  Component Value Date   WBC 5.0 01/19/2017   HGB 14.8 01/19/2017   HCT 43.9 01/19/2017   PLT 181 01/19/2017   GLUCOSE 86 01/19/2017   CHOL 121 12/23/2016   TRIG 57 12/23/2016   HDL 43 12/23/2016   LDLCALC 67 12/23/2016   ALT 16 (L) 01/19/2017   AST 17 01/19/2017   NA 137 01/19/2017   K 4.2 01/19/2017   CL 106 01/19/2017   CREATININE 1.11 01/19/2017   BUN 17 01/19/2017   CO2 21 (L) 01/19/2017   INR 1.13 01/19/2017   HGBA1C 5.8 (H) 01/19/2017  Transthoracic Echocardiography  Patient:    Aniket, Paye MR #:       [id number] Study Date: 12/23/2016 Gender:     M Age:        61 Height:     170.2 cm Weight:     75.3 kg BSA:  1.9 m^2 Pt. Status: Room:   ATTENDING    Kristeen Miss, M.D.  ORDERING     Sheliah Plane MD  REFERRING    Sheliah Plane MD  REFERRING    [place] [id number]  SONOGRAPHER  Aida Raider, RDCS  PERFORMING   [facility]  cc:  ------------------------------------------------------------------- LV EF: 60% -   65%  ------------------------------------------------------------------- Indications:      I35.1 Aortic Valve Insufficiency.  ------------------------------------------------------------------- History:   PMH:  Acquired from the patient and from  the patient&'s chart.  PMH:  Thoracic Aortic Aneurysm.  ------------------------------------------------------------------- Study Conclusions  - Left ventricle: The cavity size was normal. Wall thickness was   increased in a pattern of mild LVH. Systolic function was normal.   The estimated ejection fraction was in the range of 60% to 65%.   Wall motion was normal; there were no regional wall motion   abnormalities. Features are consistent with a pseudonormal left   ventricular filling pattern, with concomitant abnormal relaxation   and increased filling pressure (grade 2 diastolic dysfunction). - Aortic valve: There was trivial regurgitation.  ------------------------------------------------------------------- Study data:   Study status:  Routine.  Procedure:  The patient reported no pain pre or post test. Transthoracic echocardiography for left ventricular function evaluation, for right ventricular function evaluation, and for assessment of valvular function. Image quality was adequate.  Study completion:  There were no complications.          Transthoracic echocardiography.  M-mode, complete 2D, spectral Doppler, and color Doppler.  Birthdate: Patient birthdate: Jul 05, 1955.  Age:  Patient is 61 yr old.  Sex: Gender: male.    BMI: 26 kg/m^2.  Blood pressure:     140/93 Patient status:  Outpatient.  Study date:  Study date: 12/23/2016. Study time: 09:35 AM.  Location:  [facility]  -------------------------------------------------------------------  ------------------------------------------------------------------- Left ventricle:  The cavity size was normal. Wall thickness was increased in a pattern of mild LVH. Systolic function was normal. The estimated ejection fraction was in the range of 60% to 65%. Wall motion was normal; there were no regional wall motion abnormalities. Features are consistent with a pseudonormal left ventricular filling pattern, with  concomitant abnormal relaxation and increased filling pressure (grade 2 diastolic dysfunction).  ------------------------------------------------------------------- Aortic valve:   Structurally normal valve.   Cusp separation was normal.  Doppler:  Transvalvular velocity was within the normal range. There was no stenosis. There was trivial regurgitation.  ------------------------------------------------------------------- Aorta:  Aortic root: The aortic root was normal in size. Ascending aorta: The ascending aorta was normal in size.  ------------------------------------------------------------------- Mitral valve:   Structurally normal valve.   Leaflet separation was normal.  Doppler:  Transvalvular velocity was within the normal range. There was no evidence for stenosis. There was no regurgitation.  ------------------------------------------------------------------- Left atrium:  The atrium was normal in size.  ------------------------------------------------------------------- Right ventricle:  The cavity size was normal. Systolic function was normal.  ------------------------------------------------------------------- Pulmonic valve:    The valve appears to be grossly normal. Doppler:  There was no significant regurgitation.  ------------------------------------------------------------------- Tricuspid valve:   The valve appears to be grossly normal. Doppler:  There was mild regurgitation.  ------------------------------------------------------------------- Pulmonary artery:   Systolic pressure was within the normal range.   ------------------------------------------------------------------- Right atrium:  The atrium was normal in size.  ------------------------------------------------------------------- Pericardium:  There was no pericardial effusion.  ------------------------------------------------------------------- Systemic veins: Inferior vena cava: The  vessel was normal in size. The respirophasic diameter changes were in the normal range (>= 50%), consistent  with normal central venous pressure.  ------------------------------------------------------------------- Measurements   Left ventricle                         Value        Reference  LV ID, ED, PLAX chordal        (L)     34.3  mm     43 - 52  LV ID, ES, PLAX chordal                26.1  mm     23 - 38  LV fx shortening, PLAX chordal (L)     24    %      >=29  LV PW thickness, ED                    11.6  mm     ---------  IVS/LV PW ratio, ED                    1.12         <=1.3  Stroke volume, 2D                      55    ml     ---------  Stroke volume/bsa, 2D                  29    ml/m^2 ---------  LV e&', lateral                         10.4  cm/s   ---------  LV E/e&', lateral                       6.79         ---------  LV e&', medial                          9.87  cm/s   ---------  LV E/e&', medial                        7.15         ---------  LV e&', average                         10.14 cm/s   ---------  LV E/e&', average                       6.97         ---------    Ventricular septum                     Value        Reference  IVS thickness, ED                      13    mm     ---------    LVOT                                   Value        Reference  LVOT ID, S  21    mm     ---------  LVOT area                              3.46  cm^2   ---------  LVOT ID                                21    mm     ---------  LVOT peak velocity, S                  82    cm/s   ---------  LVOT mean velocity, S                  60.7  cm/s   ---------  LVOT VTI, S                            15.8  cm     ---------  LVOT peak gradient, S                  3     mm Hg  ---------  Stroke volume (SV), LVOT DP            54.7  ml     ---------  Stroke index (SV/bsa), LVOT DP         28.8  ml/m^2 ---------    Aorta                                  Value         Reference  Aortic root ID, ED                     40    mm     ---------  Ascending aorta ID, A-P, S             32    mm     ---------    Left atrium                            Value        Reference  LA ID, A-P, ES                         41    mm     ---------  LA ID/bsa, A-P                         2.16  cm/m^2 <=2.2  LA volume, S                           40    ml     ---------  LA volume/bsa, S                       21.1  ml/m^2 ---------  LA volume, ES, 1-p A4C                 47    ml     ---------  LA volume/bsa, ES, 1-p A4C  24.7  ml/m^2 ---------  LA volume, ES, 1-p A2C                 30    ml     ---------  LA volume/bsa, ES, 1-p A2C             15.8  ml/m^2 ---------    Mitral valve                           Value        Reference  Mitral E-wave peak velocity            70.6  cm/s   ---------  Mitral A-wave peak velocity            73.1  cm/s   ---------  Mitral deceleration time               197   ms     150 - 230  Mitral E/A ratio, peak                 1            ---------    Tricuspid valve                        Value        Reference  Tricuspid regurg peak velocity         248   cm/s   ---------  Tricuspid peak RV-RA gradient          25    mm Hg  ---------    Right ventricle                        Value        Reference  RV s&', lateral, S                      15.7  cm/s   ---------  Legend: (L)  and  (H)  mark values outside specified reference range.  ------------------------------------------------------------------- Prepared and Electronically Authenticated by  Kristeen Miss, M.D. 2018-08-30T13:38:49  CATH: 01/11/2017  [name], MD (Primary)    Procedures   RIGHT/LEFT HEART CATH AND CORONARY ANGIOGRAPHY- Diagnostic Only  Conclusion   1. Widely patent, angiographically normal, dominant RCA 2. Angiographically normal LAD and LCx 3. Mild narrowing at the ostium of the left main without pressure dampening 4. 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 The fem-femoral bypass is clotted but he has preserved flow to both lowe extremities without symptoms On exam the patient has no evidence of aortic insufficiency 2/ vessel coronary artery disease by ct , clear on recent catheterization 3/ the imaging of the renal cysts have been reviewed by Dr. Sherrill Raring, no further evaluation at this time is necessary with continued postop evaluation of his aorta examination of the kidneys will also be suitable for follow-up.  I've again reviewed with the patient planned hybrid procedure with replacement of aortic arch and stent grafting on the proximal descending thoracic aorta because of the persistent and enlarging pulsatile movement of his aortic arch and proximal descending aorta risk risk of death infection stroke myocardial infarction bleeding blood transfusion possible recurrent nerve injury renal failure  have all been discussed with the patient in detail he understands the risk of nonoperative treatment and is willing to proceed.      [name] MD      [address] [place] [place] [address] Office [phone]   Beeper [phone]  01/20/2017 10:58 AM

## 2017-01-21 ENCOUNTER — Inpatient Hospital Stay (HOSPITAL_COMMUNITY)
Admission: RE | Admit: 2017-01-21 | Discharge: 2017-01-28 | DRG: 219 | Disposition: A | Discharge status: Home / Self Care | Payer: Medicaid Other | Source: Ambulatory Visit | Attending: Cardiothoracic Surgery | Admitting: Cardiothoracic Surgery

## 2017-01-21 ENCOUNTER — Inpatient Hospital Stay (HOSPITAL_COMMUNITY): Payer: Medicaid Other | Admitting: Anesthesiology

## 2017-01-21 ENCOUNTER — Encounter (HOSPITAL_COMMUNITY)
Admission: RE | Disposition: A | Discharge status: Home / Self Care | Payer: Self-pay | Source: Ambulatory Visit | Attending: Cardiothoracic Surgery

## 2017-01-21 ENCOUNTER — Encounter (HOSPITAL_COMMUNITY): Payer: Self-pay | Admitting: *Deleted

## 2017-01-21 ENCOUNTER — Inpatient Hospital Stay (HOSPITAL_COMMUNITY): Payer: Medicaid Other

## 2017-01-21 DIAGNOSIS — G8929 Other chronic pain: Secondary | ICD-10-CM | POA: Diagnosis present

## 2017-01-21 DIAGNOSIS — I712 Thoracic aortic aneurysm, without rupture, unspecified: Secondary | ICD-10-CM

## 2017-01-21 DIAGNOSIS — M549 Dorsalgia, unspecified: Secondary | ICD-10-CM | POA: Diagnosis not present

## 2017-01-21 DIAGNOSIS — E119 Type 2 diabetes mellitus without complications: Secondary | ICD-10-CM | POA: Diagnosis not present

## 2017-01-21 DIAGNOSIS — K4090 Unilateral inguinal hernia, without obstruction or gangrene, not specified as recurrent: Secondary | ICD-10-CM | POA: Diagnosis present

## 2017-01-21 DIAGNOSIS — N2889 Other specified disorders of kidney and ureter: Secondary | ICD-10-CM | POA: Diagnosis present

## 2017-01-21 DIAGNOSIS — D696 Thrombocytopenia, unspecified: Secondary | ICD-10-CM | POA: Diagnosis not present

## 2017-01-21 DIAGNOSIS — Z87891 Personal history of nicotine dependence: Secondary | ICD-10-CM | POA: Diagnosis not present

## 2017-01-21 DIAGNOSIS — E8770 Fluid overload, unspecified: Secondary | ICD-10-CM | POA: Diagnosis not present

## 2017-01-21 DIAGNOSIS — Z7982 Long term (current) use of aspirin: Secondary | ICD-10-CM

## 2017-01-21 DIAGNOSIS — J189 Pneumonia, unspecified organism: Secondary | ICD-10-CM | POA: Diagnosis not present

## 2017-01-21 DIAGNOSIS — D62 Acute posthemorrhagic anemia: Secondary | ICD-10-CM | POA: Diagnosis not present

## 2017-01-21 DIAGNOSIS — I10 Essential (primary) hypertension: Secondary | ICD-10-CM | POA: Diagnosis present

## 2017-01-21 DIAGNOSIS — Z8679 Personal history of other diseases of the circulatory system: Secondary | ICD-10-CM

## 2017-01-21 DIAGNOSIS — I4891 Unspecified atrial fibrillation: Secondary | ICD-10-CM | POA: Diagnosis not present

## 2017-01-21 DIAGNOSIS — Z9689 Presence of other specified functional implants: Secondary | ICD-10-CM

## 2017-01-21 DIAGNOSIS — Z09 Encounter for follow-up examination after completed treatment for conditions other than malignant neoplasm: Secondary | ICD-10-CM

## 2017-01-21 DIAGNOSIS — Z9889 Other specified postprocedural states: Secondary | ICD-10-CM

## 2017-01-21 HISTORY — PX: TEE WITHOUT CARDIOVERSION: SHX5443

## 2017-01-21 HISTORY — PX: THORACIC AORTIC ENDOVASCULAR STENT GRAFT: SHX6112

## 2017-01-21 HISTORY — PX: STERNOTOMY: SHX1057

## 2017-01-21 HISTORY — PX: ASCENDING AORTIC ROOT REPLACEMENT: SHX5729

## 2017-01-21 LAB — CBC WITH DIFFERENTIAL/PLATELET
Basophils Absolute: 0 10*3/uL (ref 0.0–0.1)
Basophils Absolute: 0 10*3/uL (ref 0.0–0.1)
Basophils Relative: 0 %
Basophils Relative: 0 %
Eosinophils Absolute: 0 10*3/uL (ref 0.0–0.7)
Eosinophils Absolute: 0.1 10*3/uL (ref 0.0–0.7)
Eosinophils Relative: 0 %
Eosinophils Relative: 0 %
HCT: 30.8 % — ABNORMAL LOW (ref 39.0–52.0)
HCT: 32.5 % — ABNORMAL LOW (ref 39.0–52.0)
Hemoglobin: 10.1 g/dL — ABNORMAL LOW (ref 13.0–17.0)
Hemoglobin: 11 g/dL — ABNORMAL LOW (ref 13.0–17.0)
Lymphocytes Relative: 4 %
Lymphocytes Relative: 4 %
Lymphs Abs: 0.5 10*3/uL — ABNORMAL LOW (ref 0.7–4.0)
Lymphs Abs: 0.5 10*3/uL — ABNORMAL LOW (ref 0.7–4.0)
MCH: 27.4 pg (ref 26.0–34.0)
MCH: 28.1 pg (ref 26.0–34.0)
MCHC: 32.8 g/dL (ref 30.0–36.0)
MCHC: 33.8 g/dL (ref 30.0–36.0)
MCV: 83.7 fL (ref 78.0–100.0)
MCV: 83.1 fL (ref 78.0–100.0)
Monocytes Absolute: 0.2 10*3/uL (ref 0.1–1.0)
Monocytes Absolute: 0.7 10*3/uL (ref 0.1–1.0)
Monocytes Relative: 2 %
Monocytes Relative: 5 %
Neutro Abs: 11.7 10*3/uL — ABNORMAL HIGH (ref 1.7–7.7)
Neutro Abs: 12.1 10*3/uL — ABNORMAL HIGH (ref 1.7–7.7)
Neutrophils Relative %: 94 %
Neutrophils Relative %: 90 %
Platelets: 120 10*3/uL — ABNORMAL LOW (ref 150–400)
Platelets: 110 10*3/uL — ABNORMAL LOW (ref 150–400)
RBC: 3.68 MIL/uL — ABNORMAL LOW (ref 4.22–5.81)
RBC: 3.91 MIL/uL — ABNORMAL LOW (ref 4.22–5.81)
RDW: 13.7 % (ref 11.5–15.5)
RDW: 13.9 % (ref 11.5–15.5)
WBC: 12.5 10*3/uL — ABNORMAL HIGH (ref 4.0–10.5)
WBC: 13.3 10*3/uL — ABNORMAL HIGH (ref 4.0–10.5)

## 2017-01-21 LAB — CBC
HCT: 34.2 % — ABNORMAL LOW (ref 39.0–52.0)
Hemoglobin: 11.3 g/dL — ABNORMAL LOW (ref 13.0–17.0)
MCH: 27.5 pg (ref 26.0–34.0)
MCHC: 33 g/dL (ref 30.0–36.0)
MCV: 83.2 fL (ref 78.0–100.0)
PLATELETS: 110 10*3/uL — AB (ref 150–400)
RBC: 4.11 MIL/uL — ABNORMAL LOW (ref 4.22–5.81)
RDW: 13.9 % (ref 11.5–15.5)
WBC: 13.3 10*3/uL — AB (ref 4.0–10.5)

## 2017-01-21 LAB — APTT
APTT: 34 s (ref 24–36)
aPTT: 46 seconds — ABNORMAL HIGH (ref 24–36)
aPTT: 39 seconds — ABNORMAL HIGH (ref 24–36)

## 2017-01-21 LAB — POCT I-STAT 3, ART BLOOD GAS (G3+)
Acid-base deficit: 1 mmol/L (ref 0.0–2.0)
Bicarbonate: 21.8 mmol/L (ref 20.0–28.0)
O2 Saturation: 97 %
Patient temperature: 35.2
TCO2: 23 mmol/L (ref 22–32)
pCO2 arterial: 28.4 mmHg — ABNORMAL LOW (ref 32.0–48.0)
pH, Arterial: 7.488 — ABNORMAL HIGH (ref 7.350–7.450)
pO2, Arterial: 74 mmHg — ABNORMAL LOW (ref 83.0–108.0)

## 2017-01-21 LAB — HEMOGLOBIN AND HEMATOCRIT, BLOOD
HCT: 30.1 % — ABNORMAL LOW (ref 39.0–52.0)
Hemoglobin: 9.9 g/dL — ABNORMAL LOW (ref 13.0–17.0)

## 2017-01-21 LAB — FIBRINOGEN
Fibrinogen: 104 mg/dL — ABNORMAL LOW (ref 210–475)
Fibrinogen: 182 mg/dL — ABNORMAL LOW (ref 210–475)
Fibrinogen: 219 mg/dL (ref 210–475)

## 2017-01-21 LAB — PREPARE RBC (CROSSMATCH)

## 2017-01-21 LAB — PLATELET COUNT: Platelets: 70 10*3/uL — ABNORMAL LOW (ref 150–400)

## 2017-01-21 LAB — PROTIME-INR
INR: 1.36
INR: 1.16
INR: 1.09
PROTHROMBIN TIME: 14 s (ref 11.4–15.2)
Prothrombin Time: 16.6 seconds — ABNORMAL HIGH (ref 11.4–15.2)
Prothrombin Time: 14.7 seconds (ref 11.4–15.2)

## 2017-01-21 LAB — GLUCOSE, CAPILLARY: Glucose-Capillary: 146 mg/dL — ABNORMAL HIGH (ref 65–99)

## 2017-01-21 SURGERY — DEBRANCHING, AORTIC ARCH
Anesthesia: General | Site: Chest

## 2017-01-21 MED ORDER — AMIODARONE HCL IN DEXTROSE 360-4.14 MG/200ML-% IV SOLN
INTRAVENOUS | Status: AC
  Filled 2017-01-21: qty 200

## 2017-01-21 MED ORDER — SODIUM CHLORIDE 0.9% FLUSH
3.0000 mL | Freq: Two times a day (BID) | INTRAVENOUS | Status: DC
  Administered 2017-01-22 – 2017-01-27 (×10): 3 mL via INTRAVENOUS

## 2017-01-21 MED ORDER — SODIUM CHLORIDE 0.9 % IV SOLN
INTRAVENOUS | Status: DC
  Administered 2017-01-23: 10 mL/h via INTRAVENOUS

## 2017-01-21 MED ORDER — LACTATED RINGERS IV SOLN: INTRAVENOUS | Status: DC

## 2017-01-21 MED ORDER — LACTATED RINGERS IV SOLN: 500.0000 mL | Freq: Once | INTRAVENOUS | Status: DC | PRN

## 2017-01-21 MED ORDER — 0.9 % SODIUM CHLORIDE (POUR BTL) OPTIME
TOPICAL | Status: DC | PRN
  Administered 2017-01-21: 6000 mL

## 2017-01-21 MED ORDER — CALCIUM CHLORIDE 10 % IV SOLN
INTRAVENOUS | Status: DC | PRN
  Administered 2017-01-21 (×3): 200 mg via INTRAVENOUS
  Administered 2017-01-21: 100 mg via INTRAVENOUS
  Administered 2017-01-21: 200 mg via INTRAVENOUS

## 2017-01-21 MED ORDER — MORPHINE SULFATE (PF) 4 MG/ML IV SOLN
1.0000 mg | INTRAVENOUS | Status: DC | PRN
  Administered 2017-01-22: 4 mg via INTRAVENOUS

## 2017-01-21 MED ORDER — FAMOTIDINE IN NACL 20-0.9 MG/50ML-% IV SOLN
20.0000 mg | Freq: Two times a day (BID) | INTRAVENOUS | Status: AC
  Administered 2017-01-21: 20 mg via INTRAVENOUS

## 2017-01-21 MED ORDER — DIPHENHYDRAMINE HCL 50 MG/ML IJ SOLN
INTRAMUSCULAR | Status: AC
  Filled 2017-01-21: qty 1

## 2017-01-21 MED ORDER — SODIUM CHLORIDE 0.9 % IV SOLN
INTRAVENOUS | Status: DC
  Filled 2017-01-21: qty 1

## 2017-01-21 MED ORDER — SODIUM CHLORIDE 0.9 % IV SOLN
INTRAVENOUS | Status: DC | PRN
  Administered 2017-01-21: 0.2 ug/kg/h via INTRAVENOUS

## 2017-01-21 MED ORDER — CHLORHEXIDINE GLUCONATE 4 % EX LIQD: 30.0000 mL | CUTANEOUS | Status: DC

## 2017-01-21 MED ORDER — VANCOMYCIN HCL IN DEXTROSE 1-5 GM/200ML-% IV SOLN
1000.0000 mg | Freq: Once | INTRAVENOUS | Status: AC
  Administered 2017-01-22: 1000 mg via INTRAVENOUS
  Filled 2017-01-21: qty 200

## 2017-01-21 MED ORDER — MIDAZOLAM HCL 5 MG/5ML IJ SOLN
INTRAMUSCULAR | Status: DC | PRN
  Administered 2017-01-21 (×4): 2 mg via INTRAVENOUS

## 2017-01-21 MED ORDER — DOCUSATE SODIUM 100 MG PO CAPS
200.0000 mg | ORAL_CAPSULE | Freq: Every day | ORAL | Status: DC
  Administered 2017-01-22 – 2017-01-27 (×6): 200 mg via ORAL
  Filled 2017-01-21 (×6): qty 2

## 2017-01-21 MED ORDER — SODIUM CHLORIDE 0.9% FLUSH
3.0000 mL | INTRAVENOUS | Status: DC | PRN
Start: 2017-01-22 — End: 2017-01-27

## 2017-01-21 MED ORDER — PANTOPRAZOLE SODIUM 40 MG PO TBEC
40.0000 mg | DELAYED_RELEASE_TABLET | Freq: Every day | ORAL | Status: DC
  Administered 2017-01-23 – 2017-01-27 (×5): 40 mg via ORAL
  Filled 2017-01-21 (×5): qty 1

## 2017-01-21 MED ORDER — LACTATED RINGERS IV SOLN
INTRAVENOUS | Status: DC | PRN
  Administered 2017-01-21 (×2): via INTRAVENOUS

## 2017-01-21 MED ORDER — MORPHINE SULFATE (PF) 4 MG/ML IV SOLN
2.0000 mg | INTRAVENOUS | Status: DC | PRN
  Administered 2017-01-22: 2 mg via INTRAVENOUS
  Administered 2017-01-22 (×2): 4 mg via INTRAVENOUS
  Administered 2017-01-22: 2 mg via INTRAVENOUS
  Filled 2017-01-21 (×5): qty 1

## 2017-01-21 MED ORDER — DEXTROSE 5 % IV SOLN
0.0000 ug/min | INTRAVENOUS | Status: DC
  Filled 2017-01-21: qty 4

## 2017-01-21 MED ORDER — ASPIRIN EC 325 MG PO TBEC
325.0000 mg | DELAYED_RELEASE_TABLET | Freq: Every day | ORAL | Status: DC
  Administered 2017-01-22 – 2017-01-25 (×4): 325 mg via ORAL
  Filled 2017-01-21 (×4): qty 1

## 2017-01-21 MED ORDER — TRAMADOL HCL 50 MG PO TABS
50.0000 mg | ORAL_TABLET | ORAL | Status: DC | PRN
  Administered 2017-01-24 – 2017-01-25 (×2): 100 mg via ORAL
  Administered 2017-01-26: 50 mg via ORAL
  Administered 2017-01-27: 100 mg via ORAL
  Filled 2017-01-21: qty 1
  Filled 2017-01-21 (×3): qty 2

## 2017-01-21 MED ORDER — TRANEXAMIC ACID 1000 MG/10ML IV SOLN
1.5000 mg/kg/h | INTRAVENOUS | Status: AC
  Administered 2017-01-21: 1.5 mg/kg/h via INTRAVENOUS
  Filled 2017-01-21: qty 25

## 2017-01-21 MED ORDER — PHENYLEPHRINE 40 MCG/ML (10ML) SYRINGE FOR IV PUSH (FOR BLOOD PRESSURE SUPPORT)
PREFILLED_SYRINGE | INTRAVENOUS | Status: AC
  Filled 2017-01-21: qty 10

## 2017-01-21 MED ORDER — IODIXANOL 320 MG/ML IV SOLN
INTRAVENOUS | Status: DC | PRN
  Administered 2017-01-21: 150 mL via INTRAVENOUS

## 2017-01-21 MED ORDER — METOPROLOL TARTRATE 5 MG/5ML IV SOLN
2.5000 mg | INTRAVENOUS | Status: DC | PRN
  Administered 2017-01-23: 2.5 mg via INTRAVENOUS
  Filled 2017-01-21: qty 5

## 2017-01-21 MED ORDER — SODIUM CHLORIDE 0.9 % IV SOLN
INTRAVENOUS | Status: DC | PRN
  Administered 2017-01-21: 1 [IU]/h via INTRAVENOUS

## 2017-01-21 MED ORDER — CEFUROXIME SODIUM 1.5 G IV SOLR
1.5000 g | Freq: Two times a day (BID) | INTRAVENOUS | Status: AC
  Administered 2017-01-21 – 2017-01-23 (×4): 1.5 g via INTRAVENOUS
  Filled 2017-01-21 (×4): qty 1.5

## 2017-01-21 MED ORDER — AMIODARONE HCL IN DEXTROSE 360-4.14 MG/200ML-% IV SOLN
30.0000 mg/h | INTRAVENOUS | Status: DC
  Administered 2017-01-22: 30 mg/h via INTRAVENOUS
  Filled 2017-01-21 (×3): qty 200

## 2017-01-21 MED ORDER — ROCURONIUM BROMIDE 10 MG/ML (PF) SYRINGE
PREFILLED_SYRINGE | INTRAVENOUS | Status: AC
  Filled 2017-01-21: qty 10

## 2017-01-21 MED ORDER — PHENYLEPHRINE HCL 10 MG/ML IJ SOLN
INTRAMUSCULAR | Status: DC | PRN
  Administered 2017-01-21: 10 ug/min via INTRAVENOUS

## 2017-01-21 MED ORDER — MAGNESIUM SULFATE 4 GM/100ML IV SOLN
4.0000 g | Freq: Once | INTRAVENOUS | Status: AC
  Administered 2017-01-21: 4 g via INTRAVENOUS
  Filled 2017-01-21: qty 100

## 2017-01-21 MED ORDER — POTASSIUM CHLORIDE 10 MEQ/50ML IV SOLN: 10.0000 meq | INTRAVENOUS | Status: AC

## 2017-01-21 MED ORDER — THROMBIN 5000 UNITS EX SOLR
CUTANEOUS | Status: AC
  Filled 2017-01-21: qty 5000

## 2017-01-21 MED ORDER — ACETAMINOPHEN 160 MG/5ML PO SOLN: 650.0000 mg | Freq: Once | ORAL | Status: AC

## 2017-01-21 MED ORDER — ASPIRIN 81 MG PO CHEW: 324.0000 mg | CHEWABLE_TABLET | Freq: Every day | ORAL | Status: DC

## 2017-01-21 MED ORDER — TRANEXAMIC ACID 1000 MG/10ML IV SOLN
INTRAVENOUS | Status: DC | PRN
  Administered 2017-01-21: 1.5 mg/kg/h via INTRAVENOUS

## 2017-01-21 MED ORDER — MORPHINE SULFATE (PF) 2 MG/ML IV SOLN: 1.0000 mg | INTRAVENOUS | Status: DC | PRN

## 2017-01-21 MED ORDER — DEXMEDETOMIDINE HCL IN NACL 400 MCG/100ML IV SOLN
0.0000 ug/kg/h | INTRAVENOUS | Status: DC
Start: 2017-01-21 — End: 2017-01-22
  Administered 2017-01-21 – 2017-01-22 (×2): 0.7 ug/kg/h via INTRAVENOUS
  Filled 2017-01-21 (×2): qty 100

## 2017-01-21 MED ORDER — MIDAZOLAM HCL 10 MG/2ML IJ SOLN
INTRAMUSCULAR | Status: AC
  Filled 2017-01-21: qty 2

## 2017-01-21 MED ORDER — COAGULATION FACTOR VIIA RECOMB 1 MG IV SOLR
45.0000 ug/kg | Freq: Once | INTRAVENOUS | Status: AC
  Administered 2017-01-21: 3000 ug via INTRAVENOUS
  Filled 2017-01-21: qty 1

## 2017-01-21 MED ORDER — ETOMIDATE 2 MG/ML IV SOLN
INTRAVENOUS | Status: DC | PRN
  Administered 2017-01-21: 14 mg via INTRAVENOUS

## 2017-01-21 MED ORDER — ETOMIDATE 2 MG/ML IV SOLN
INTRAVENOUS | Status: AC
  Filled 2017-01-21: qty 10

## 2017-01-21 MED ORDER — CHLORHEXIDINE GLUCONATE 4 % EX LIQD: 60.0000 mL | Freq: Once | CUTANEOUS | Status: DC

## 2017-01-21 MED ORDER — SODIUM CHLORIDE 0.9 % IV SOLN: INTRAVENOUS | Status: DC

## 2017-01-21 MED ORDER — HEPARIN SODIUM (PORCINE) 1000 UNIT/ML IJ SOLN
INTRAMUSCULAR | Status: DC | PRN
  Administered 2017-01-21: 27 mL via INTRAVENOUS

## 2017-01-21 MED ORDER — SODIUM CHLORIDE 0.9 % IJ SOLN
OROMUCOSAL | Status: DC | PRN
  Administered 2017-01-21 (×3): 4 mL via TOPICAL

## 2017-01-21 MED ORDER — MIDAZOLAM HCL 2 MG/2ML IJ SOLN
2.0000 mg | INTRAMUSCULAR | Status: DC | PRN
  Administered 2017-01-22 (×3): 2 mg via INTRAVENOUS
  Filled 2017-01-21 (×4): qty 2

## 2017-01-21 MED ORDER — ONDANSETRON HCL 4 MG/2ML IJ SOLN
4.0000 mg | Freq: Four times a day (QID) | INTRAMUSCULAR | Status: DC | PRN
  Administered 2017-01-22 – 2017-01-23 (×3): 4 mg via INTRAVENOUS
  Filled 2017-01-21 (×3): qty 2

## 2017-01-21 MED ORDER — ARTIFICIAL TEARS OPHTHALMIC OINT
TOPICAL_OINTMENT | OPHTHALMIC | Status: AC
  Filled 2017-01-21: qty 3.5

## 2017-01-21 MED ORDER — ACETAMINOPHEN 650 MG RE SUPP
650.0000 mg | Freq: Once | RECTAL | Status: AC
  Administered 2017-01-21: 650 mg via RECTAL

## 2017-01-21 MED ORDER — CHLORHEXIDINE GLUCONATE 0.12 % MT SOLN
15.0000 mL | OROMUCOSAL | Status: AC
Start: 2017-01-21 — End: 2017-01-21
  Administered 2017-01-21: 15 mL via OROMUCOSAL

## 2017-01-21 MED ORDER — SODIUM CHLORIDE 0.9 % IV SOLN: 0.0000 ug/kg/h | INTRAVENOUS | Status: DC

## 2017-01-21 MED ORDER — SODIUM CHLORIDE 0.9 % IV SOLN: Freq: Once | INTRAVENOUS | Status: DC

## 2017-01-21 MED ORDER — METOPROLOL TARTRATE 12.5 MG HALF TABLET
12.5000 mg | ORAL_TABLET | Freq: Once | ORAL | Status: AC
  Administered 2017-01-21: 12.5 mg via ORAL
  Filled 2017-01-21: qty 1

## 2017-01-21 MED ORDER — ROCURONIUM BROMIDE 100 MG/10ML IV SOLN
INTRAVENOUS | Status: DC | PRN
  Administered 2017-01-21 (×2): 50 mg via INTRAVENOUS
  Administered 2017-01-21: 100 mg via INTRAVENOUS
  Administered 2017-01-21: 30 mg via INTRAVENOUS
  Administered 2017-01-21 (×2): 50 mg via INTRAVENOUS

## 2017-01-21 MED ORDER — EPINEPHRINE PF 1 MG/ML IJ SOLN
INTRAVENOUS | Status: DC | PRN
  Administered 2017-01-21: 1 ug/min via INTRAVENOUS

## 2017-01-21 MED ORDER — LACTATED RINGERS IV SOLN
INTRAVENOUS | Status: DC | PRN
  Administered 2017-01-21 (×2): via INTRAVENOUS

## 2017-01-21 MED ORDER — ALBUMIN HUMAN 5 % IV SOLN
250.0000 mL | INTRAVENOUS | Status: AC | PRN
  Administered 2017-01-21 – 2017-01-22 (×4): 250 mL via INTRAVENOUS
  Filled 2017-01-21: qty 250

## 2017-01-21 MED ORDER — METHYLPREDNISOLONE SODIUM SUCC 125 MG IJ SOLR
INTRAMUSCULAR | Status: DC | PRN
  Administered 2017-01-21: 125 mg via INTRAVENOUS

## 2017-01-21 MED ORDER — AMIODARONE LOAD VIA INFUSION
150.0000 mg | Freq: Once | INTRAVENOUS | Status: AC
  Administered 2017-01-21: 150 mg via INTRAVENOUS
  Filled 2017-01-21: qty 83.34

## 2017-01-21 MED ORDER — ACETAMINOPHEN 500 MG PO TABS
1000.0000 mg | ORAL_TABLET | Freq: Four times a day (QID) | ORAL | Status: DC
  Administered 2017-01-22 – 2017-01-26 (×18): 1000 mg via ORAL
  Filled 2017-01-21 (×20): qty 2

## 2017-01-21 MED ORDER — METOPROLOL TARTRATE 25 MG/10 ML ORAL SUSPENSION: 12.5000 mg | Freq: Two times a day (BID) | ORAL | Status: DC

## 2017-01-21 MED ORDER — HEMOSTATIC AGENTS (NO CHARGE) OPTIME
TOPICAL | Status: DC | PRN
  Administered 2017-01-21 (×5): 1 via TOPICAL

## 2017-01-21 MED ORDER — FENTANYL CITRATE (PF) 250 MCG/5ML IJ SOLN
INTRAMUSCULAR | Status: DC | PRN
  Administered 2017-01-21: 100 ug via INTRAVENOUS
  Administered 2017-01-21: 150 ug via INTRAVENOUS
  Administered 2017-01-21: 250 ug via INTRAVENOUS
  Administered 2017-01-21: 200 ug via INTRAVENOUS
  Administered 2017-01-21: 1000 ug via INTRAVENOUS
  Administered 2017-01-21: 50 ug via INTRAVENOUS

## 2017-01-21 MED ORDER — DIPHENHYDRAMINE HCL 50 MG/ML IJ SOLN
INTRAMUSCULAR | Status: DC | PRN
  Administered 2017-01-21: 50 mg via INTRAVENOUS

## 2017-01-21 MED ORDER — HEMOSTATIC AGENTS (NO CHARGE) OPTIME
TOPICAL | Status: DC | PRN
  Administered 2017-01-21: 1 via TOPICAL

## 2017-01-21 MED ORDER — METOCLOPRAMIDE HCL 5 MG/ML IJ SOLN
10.0000 mg | Freq: Four times a day (QID) | INTRAMUSCULAR | Status: AC
  Administered 2017-01-22 (×4): 10 mg via INTRAVENOUS
  Filled 2017-01-21 (×3): qty 2

## 2017-01-21 MED ORDER — SODIUM CHLORIDE 0.9 % IV SOLN
INTRAVENOUS | Status: DC | PRN
  Administered 2017-01-21: 19:00:00

## 2017-01-21 MED ORDER — LACTATED RINGERS IV SOLN
INTRAVENOUS | Status: DC | PRN
  Administered 2017-01-21: 07:00:00 via INTRAVENOUS

## 2017-01-21 MED ORDER — METOPROLOL TARTRATE 12.5 MG HALF TABLET
12.5000 mg | ORAL_TABLET | Freq: Two times a day (BID) | ORAL | Status: DC
  Administered 2017-01-22 – 2017-01-27 (×11): 12.5 mg via ORAL
  Filled 2017-01-21 (×11): qty 1

## 2017-01-21 MED ORDER — OXYCODONE HCL 5 MG PO TABS
5.0000 mg | ORAL_TABLET | ORAL | Status: DC | PRN
  Administered 2017-01-22 – 2017-01-23 (×7): 10 mg via ORAL
  Filled 2017-01-21 (×7): qty 2

## 2017-01-21 MED ORDER — NITROGLYCERIN IN D5W 200-5 MCG/ML-% IV SOLN: 0.0000 ug/min | INTRAVENOUS | Status: DC

## 2017-01-21 MED ORDER — PROTAMINE SULFATE 10 MG/ML IV SOLN
INTRAVENOUS | Status: DC | PRN
  Administered 2017-01-21: 250 mg via INTRAVENOUS

## 2017-01-21 MED ORDER — PROPOFOL 10 MG/ML IV BOLUS
INTRAVENOUS | Status: AC
  Filled 2017-01-21: qty 40

## 2017-01-21 MED ORDER — MORPHINE SULFATE (PF) 2 MG/ML IV SOLN: 2.0000 mg | INTRAVENOUS | Status: DC | PRN

## 2017-01-21 MED ORDER — VASOPRESSIN 20 UNIT/ML IV SOLN
0.0300 [IU]/min | INTRAVENOUS | Status: DC
  Filled 2017-01-21: qty 2

## 2017-01-21 MED ORDER — BISACODYL 10 MG RE SUPP: 10.0000 mg | Freq: Every day | RECTAL | Status: DC

## 2017-01-21 MED ORDER — ACETAMINOPHEN 160 MG/5ML PO SOLN
1000.0000 mg | Freq: Four times a day (QID) | ORAL | Status: DC
  Administered 2017-01-22: 1000 mg

## 2017-01-21 MED ORDER — CHLORHEXIDINE GLUCONATE 0.12 % MT SOLN
OROMUCOSAL | Status: AC
  Administered 2017-01-21: 15 mL via OROMUCOSAL
  Filled 2017-01-21: qty 15

## 2017-01-21 MED ORDER — SODIUM CHLORIDE 0.9 % IV SOLN: 250.0000 mL | INTRAVENOUS | Status: DC

## 2017-01-21 MED ORDER — CHLORHEXIDINE GLUCONATE 0.12 % MT SOLN
15.0000 mL | Freq: Once | OROMUCOSAL | Status: AC
  Administered 2017-01-21: 15 mL via OROMUCOSAL

## 2017-01-21 MED ORDER — FENTANYL CITRATE (PF) 250 MCG/5ML IJ SOLN
INTRAMUSCULAR | Status: AC
  Filled 2017-01-21: qty 10

## 2017-01-21 MED ORDER — INSULIN REGULAR BOLUS VIA INFUSION
0.0000 [IU] | Freq: Three times a day (TID) | INTRAVENOUS | Status: DC
  Filled 2017-01-21: qty 10

## 2017-01-21 MED ORDER — THROMBIN 5000 UNITS EX SOLR
CUTANEOUS | Status: DC | PRN
  Administered 2017-01-21: 5000 [IU] via TOPICAL

## 2017-01-21 MED ORDER — BISACODYL 5 MG PO TBEC
10.0000 mg | DELAYED_RELEASE_TABLET | Freq: Every day | ORAL | Status: DC
  Administered 2017-01-22 – 2017-01-27 (×6): 10 mg via ORAL
  Filled 2017-01-21 (×6): qty 2

## 2017-01-21 MED ORDER — SODIUM CHLORIDE 0.9 % IV SOLN
0.0000 ug/min | INTRAVENOUS | Status: DC
  Filled 2017-01-21: qty 2

## 2017-01-21 MED ORDER — PROPOFOL 10 MG/ML IV BOLUS
INTRAVENOUS | Status: DC | PRN
  Administered 2017-01-21: 50 mg via INTRAVENOUS

## 2017-01-21 MED ORDER — AMIODARONE HCL IN DEXTROSE 360-4.14 MG/200ML-% IV SOLN
60.0000 mg/h | INTRAVENOUS | Status: DC
  Administered 2017-01-21 – 2017-01-22 (×2): 60 mg/h via INTRAVENOUS

## 2017-01-21 MED ORDER — SODIUM CHLORIDE 0.45 % IV SOLN
INTRAVENOUS | Status: DC | PRN
  Administered 2017-01-21: 23:00:00 via INTRAVENOUS

## 2017-01-21 MED ORDER — FENTANYL CITRATE (PF) 250 MCG/5ML IJ SOLN
INTRAMUSCULAR | Status: AC
  Filled 2017-01-21: qty 25

## 2017-01-21 SURGICAL SUPPLY — 191 items
ADAPTER CARDIO PERF ANTE/RETRO (ADAPTER) ×3 IMPLANT
ADH SKN CLS APL DERMABOND .7 (GAUZE/BANDAGES/DRESSINGS) ×2
ADPR PRFSN 84XANTGRD RTRGD (ADAPTER) ×2
AGENT HMST MTR 8 SURGIFLO (HEMOSTASIS) ×2
APL SRG 7X2 LUM MLBL SLNT (VASCULAR PRODUCTS) ×2
APPLICATOR COTTON TIP 6IN STRL (MISCELLANEOUS) IMPLANT
APPLICATOR TIP COSEAL (VASCULAR PRODUCTS) ×1 IMPLANT
BAG DECANTER FOR FLEXI CONT (MISCELLANEOUS) ×3 IMPLANT
BAG SNAP BAND KOVER 36X36 (MISCELLANEOUS) ×2 IMPLANT
BLADE STERNUM SYSTEM 6 (BLADE) ×3 IMPLANT
BLADE SURG 15 STRL LF DISP TIS (BLADE) ×2 IMPLANT
BLADE SURG 15 STRL SS (BLADE) ×3
BLANKET WARM CARDIAC ADLT BAIR (MISCELLANEOUS) ×1 IMPLANT
BOOT SUTURE AID YELLOW STND (SUTURE) IMPLANT
CANISTER SUCT 3000ML PPV (MISCELLANEOUS) ×5 IMPLANT
CANN PRFSN .5XCNCT 15X34-48 (MISCELLANEOUS) ×2
CANNULA AORTIC ROOT 9FR (CANNULA) ×1 IMPLANT
CANNULA GRAFT 8MMX50CM (Graft) ×1 IMPLANT
CANNULA GUNDRY RCSP 15FR (MISCELLANEOUS) ×3 IMPLANT
CANNULA PRFSN .5XCNCT 15X34-48 (MISCELLANEOUS) ×2 IMPLANT
CANNULA VEN 2 STAGE (MISCELLANEOUS) ×3
CATH ACCU-VU SIZ PIG 5F 100CM (CATHETERS) ×2 IMPLANT
CATH ANGIO 5F BER2 65CM (CATHETERS) ×2 IMPLANT
CATH BALLN TRILOBE 26-42 (BALLOONS) ×1 IMPLANT
CATH CPB KIT GERHARDT (MISCELLANEOUS) ×2 IMPLANT
CATH HEART VENT LEFT (CATHETERS) ×2 IMPLANT
CATH OMNI FLUSH .035X70CM (CATHETERS) ×1 IMPLANT
CATH RETROPLEGIA CORONARY 14FR (CATHETERS) IMPLANT
CATH ROBINSON RED A/P 18FR (CATHETERS) ×6 IMPLANT
CATH VISIONS PV .035 IVUS (CATHETERS) ×1 IMPLANT
CATH/SQUID NICHOLS JEHLE COR (CATHETERS) IMPLANT
CAUTERY EYE LOW TEMP 1300F FIN (OPHTHALMIC RELATED) ×3 IMPLANT
CLIP FOGARTY SPRING 6M (CLIP) IMPLANT
CONN ST 1/4X3/8  BEN (MISCELLANEOUS) ×1
CONN ST 1/4X3/8 BEN (MISCELLANEOUS) IMPLANT
CONT SPEC 4OZ CLIKSEAL STRL BL (MISCELLANEOUS) ×2 IMPLANT
COVER DOME SNAP 22 D (MISCELLANEOUS) ×1 IMPLANT
COVER PROBE W GEL 5X96 (DRAPES) ×4 IMPLANT
CRADLE DONUT ADULT HEAD (MISCELLANEOUS) ×3 IMPLANT
DERMABOND ADVANCED (GAUZE/BANDAGES/DRESSINGS) ×1
DERMABOND ADVANCED .7 DNX12 (GAUZE/BANDAGES/DRESSINGS) ×2 IMPLANT
DEVICE CLOSURE PERCLS PRGLD 6F (VASCULAR PRODUCTS) IMPLANT
DRAIN CHANNEL 28F RND 3/8 FF (WOUND CARE) IMPLANT
DRAIN CHANNEL 32F RND 10.7 FF (WOUND CARE) ×3 IMPLANT
DRAPE CARDIOVASCULAR INCISE (DRAPES) ×3
DRAPE HALF SHEET 40X57 (DRAPES) ×2 IMPLANT
DRAPE INCISE IOBAN 66X45 STRL (DRAPES) ×2 IMPLANT
DRAPE SLUSH/WARMER DISC (DRAPES) ×3 IMPLANT
DRAPE SRG 135X102X78XABS (DRAPES) ×2 IMPLANT
DRAPE ZERO GRAVITY STERILE (DRAPES) ×3 IMPLANT
DRSG AQUACEL AG ADV 3.5X14 (GAUZE/BANDAGES/DRESSINGS) ×3 IMPLANT
DRSG TEGADERM 2-3/8X2-3/4 SM (GAUZE/BANDAGES/DRESSINGS) ×3 IMPLANT
DRYSEAL FLEXSHEATH 24FR 33CM (SHEATH) ×1
ELECT BLADE 4.0 EZ CLEAN MEGAD (MISCELLANEOUS) ×3
ELECT CAUTERY BLADE 6.4 (BLADE) ×5 IMPLANT
ELECT REM PT RETURN 9FT ADLT (ELECTROSURGICAL) ×6
ELECTRODE BLDE 4.0 EZ CLN MEGD (MISCELLANEOUS) ×2 IMPLANT
ELECTRODE REM PT RTRN 9FT ADLT (ELECTROSURGICAL) ×8 IMPLANT
ENDOPROSTHESIS THORAC 34X34X10 (Endovascular Graft) IMPLANT
ENDOPROSTHESIS THORAC 34X34X20 (Endovascular Graft) IMPLANT
ENDOPROSTHESIS THORAC 37X37X10 (Endovascular Graft) IMPLANT
ENDOPROSTHESIS THORAC 37X37X15 (Endovascular Graft) ×1 IMPLANT
ENDOPROSTHESIS THORAC 40X40X15 (Endovascular Graft) IMPLANT
ENDOPROTH THORACIC 34X34X10 (Endovascular Graft) ×3 IMPLANT
ENDOPROTH THORACIC 34X34X20 (Endovascular Graft) IMPLANT
ENDOPROTH THORACIC 37X37X10 (Endovascular Graft) IMPLANT
ENDOPROTH THORACIC 37X37X15 (Endovascular Graft) ×3 IMPLANT
ENDOPROTH THORACIC 40X40X15 (Endovascular Graft) ×3 IMPLANT
FELT TEFLON 1X6 (MISCELLANEOUS) ×3 IMPLANT
GAUZE SPONGE 4X4 12PLY STRL (GAUZE/BANDAGES/DRESSINGS) ×6 IMPLANT
GAUZE SPONGE 4X4 12PLY STRL LF (GAUZE/BANDAGES/DRESSINGS) ×1 IMPLANT
GAUZE SPONGE 4X4 16PLY XRAY LF (GAUZE/BANDAGES/DRESSINGS) ×1 IMPLANT
GLOVE BIO SURGEON STRL SZ 6 (GLOVE) ×1 IMPLANT
GLOVE BIO SURGEON STRL SZ 6.5 (GLOVE) ×10 IMPLANT
GLOVE BIO SURGEON STRL SZ8.5 (GLOVE) ×1 IMPLANT
GLOVE BIOGEL M STER SZ 6 (GLOVE) ×4 IMPLANT
GLOVE BIOGEL PI IND STRL 6 (GLOVE) IMPLANT
GLOVE BIOGEL PI IND STRL 6.5 (GLOVE) IMPLANT
GLOVE BIOGEL PI IND STRL 7.5 (GLOVE) ×2 IMPLANT
GLOVE BIOGEL PI INDICATOR 6 (GLOVE) ×2
GLOVE BIOGEL PI INDICATOR 6.5 (GLOVE) ×6
GLOVE BIOGEL PI INDICATOR 7.5 (GLOVE) ×3
GLOVE SURG SS PI 7.5 STRL IVOR (GLOVE) ×4 IMPLANT
GOWN STRL REUS W/ TWL LRG LVL3 (GOWN DISPOSABLE) ×12 IMPLANT
GOWN STRL REUS W/ TWL XL LVL3 (GOWN DISPOSABLE) ×2 IMPLANT
GOWN STRL REUS W/TWL LRG LVL3 (GOWN DISPOSABLE) ×30
GOWN STRL REUS W/TWL XL LVL3 (GOWN DISPOSABLE) ×3
GRAFT 4 BRANCH 30X50 (Prosthesis & Implant Heart) ×1 IMPLANT
GRAFT BALLN CATH 65CM (STENTS) IMPLANT
HANDLE STAPLE ENDO GIA SHORT (STAPLE) ×1
HEMOSTAT POWDER SURGIFOAM 1G (HEMOSTASIS) ×12 IMPLANT
HEMOSTAT SNOW SURGICEL 2X4 (HEMOSTASIS) ×1 IMPLANT
HEMOSTAT SURGICEL 2X14 (HEMOSTASIS) ×4 IMPLANT
HEMOSTAT SURGICEL 2X4 FIBR (HEMOSTASIS) ×2 IMPLANT
INSERT FOGARTY SM (MISCELLANEOUS) ×4 IMPLANT
INSERT FOGARTY XLG (MISCELLANEOUS) IMPLANT
KIT BASIN OR (CUSTOM PROCEDURE TRAY) ×5 IMPLANT
KIT CATH SUCT 8FR (CATHETERS) ×3 IMPLANT
KIT ROOM TURNOVER OR (KITS) ×5 IMPLANT
KIT SUCTION CATH 14FR (SUCTIONS) ×8 IMPLANT
LEAD PACING MYOCARDI (MISCELLANEOUS) ×3 IMPLANT
LINE VENT (MISCELLANEOUS) ×1 IMPLANT
LOOP VESSEL MAXI BLUE (MISCELLANEOUS) ×4 IMPLANT
LOOP VESSEL SUPERMAXI WHITE (MISCELLANEOUS) ×1 IMPLANT
NDL PERC 18GX7CM (NEEDLE) ×2 IMPLANT
NEEDLE PERC 18GX7CM (NEEDLE) ×6 IMPLANT
NS IRRIG 1000ML POUR BTL (IV SOLUTION) ×18 IMPLANT
PACK ENDOVASCULAR (PACKS) ×3 IMPLANT
PACK OPEN HEART (CUSTOM PROCEDURE TRAY) ×3 IMPLANT
PAD ARMBOARD 7.5X6 YLW CONV (MISCELLANEOUS) ×12 IMPLANT
PENCIL BUTTON HOLSTER BLD 10FT (ELECTRODE) ×3 IMPLANT
PERCLOSE PROGLIDE 6F (VASCULAR PRODUCTS)
RELOAD EGIA 45 TAN VASC (STAPLE) ×2 IMPLANT
RELOAD TRI 2.0 30 VAS MED SUL (STAPLE) ×2 IMPLANT
SET CARDIOPLEGIA MPS 5001102 (MISCELLANEOUS) ×1 IMPLANT
SET VEIN GRAFT PERF (SET/KITS/TRAYS/PACK) ×2 IMPLANT
SHEATH AVANTI 11CM 8FR (MISCELLANEOUS) IMPLANT
SHEATH DRYSEAL FLEX 24FR 33CM (SHEATH) ×1 IMPLANT
SHEATH PINNACLE 8F 10CM (SHEATH) ×2 IMPLANT
SHIELD RADPAD SCOOP 12X17 (MISCELLANEOUS) ×6 IMPLANT
SLEEVE ISOL F/PACE RF HD COVER (MISCELLANEOUS) ×1 IMPLANT
SPOGE SURGIFLO 8M (HEMOSTASIS) ×1
SPONGE GAUZE 4X4 12PLY STER LF (GAUZE/BANDAGES/DRESSINGS) ×6 IMPLANT
SPONGE LAP 18X18 X RAY DECT (DISPOSABLE) ×3 IMPLANT
SPONGE LAP 4X18 X RAY DECT (DISPOSABLE) ×1 IMPLANT
SPONGE SURGIFLO 8M (HEMOSTASIS) IMPLANT
STAPLER ENDO GIA 12 SHRT THIN (STAPLE) IMPLANT
STAPLER ENDO GIA 12MM SHORT (STAPLE) ×2 IMPLANT
STENT GRAFT BALLN CATH 65CM (STENTS)
STOPCOCK 4 WAY LG BORE MALE ST (IV SETS) IMPLANT
STOPCOCK MORSE 400PSI 3WAY (MISCELLANEOUS) ×5 IMPLANT
SUT ETHIBON 2 0 V 52N 30 (SUTURE) ×6 IMPLANT
SUT ETHIBOND 2 0 SH (SUTURE) ×12
SUT ETHIBOND 2 0 SH 36X2 (SUTURE) ×2 IMPLANT
SUT ETHILON 3 0 PS 1 (SUTURE) IMPLANT
SUT PROLENE 3 0 RB 1 (SUTURE) ×4 IMPLANT
SUT PROLENE 3 0 SH 1 (SUTURE) ×3 IMPLANT
SUT PROLENE 3 0 SH 48 (SUTURE) ×2 IMPLANT
SUT PROLENE 3 0 SH1 36 (SUTURE) ×18 IMPLANT
SUT PROLENE 4 0 RB 1 (SUTURE) ×12
SUT PROLENE 4-0 RB1 .5 CRCL 36 (SUTURE) ×6 IMPLANT
SUT PROLENE 5 0 C 1 24 (SUTURE) ×2 IMPLANT
SUT PROLENE 5 0 C 1 36 (SUTURE) ×2 IMPLANT
SUT PROLENE 6 0 C 1 30 (SUTURE) ×12 IMPLANT
SUT PROLENE 8 0 BV175 6 (SUTURE) ×2 IMPLANT
SUT SILK  1 MH (SUTURE) ×4
SUT SILK 1 MH (SUTURE) ×4 IMPLANT
SUT SILK 1 TIES 10X30 (SUTURE) ×1 IMPLANT
SUT SILK 2 0 SH CR/8 (SUTURE) ×3 IMPLANT
SUT SILK 2 0 TIES 10X30 (SUTURE) ×1 IMPLANT
SUT SILK 2 0 TIES 17X18 (SUTURE) ×3
SUT SILK 2-0 18XBRD TIE BLK (SUTURE) IMPLANT
SUT SILK 3 0 SH CR/8 (SUTURE) ×1 IMPLANT
SUT SILK 4 0 TIE 10X30 (SUTURE) ×4 IMPLANT
SUT STEEL 6MS V (SUTURE) ×3 IMPLANT
SUT STEEL SZ 6 DBL 3X14 BALL (SUTURE) ×3 IMPLANT
SUT TEM PAC WIRE 2 0 SH (SUTURE) ×2 IMPLANT
SUT VIC AB 1 CTX 18 (SUTURE) ×6 IMPLANT
SUT VIC AB 2-0 CT1 27 (SUTURE)
SUT VIC AB 2-0 CT1 TAPERPNT 27 (SUTURE) IMPLANT
SUT VIC AB 2-0 CTX 27 (SUTURE) ×6 IMPLANT
SUT VIC AB 3-0 SH 27 (SUTURE)
SUT VIC AB 3-0 SH 27X BRD (SUTURE) IMPLANT
SUT VIC AB 3-0 X1 27 (SUTURE) ×3 IMPLANT
SUT VICRYL 4-0 PS2 18IN ABS (SUTURE) IMPLANT
SUTURE E-PAK OPEN HEART (SUTURE) ×2 IMPLANT
SYR 10ML LL (SYRINGE) ×2 IMPLANT
SYR 20CC LL (SYRINGE) ×1 IMPLANT
SYR 30ML LL (SYRINGE) IMPLANT
SYR 50ML LL SCALE MARK (SYRINGE) ×3 IMPLANT
SYR MEDRAD MARK V 150ML (SYRINGE) ×1 IMPLANT
SYRINGE 20CC LL (MISCELLANEOUS) ×1 IMPLANT
SYSTEM SAHARA CHEST DRAIN ATS (WOUND CARE) ×3 IMPLANT
TAPE CLOTH SURG 4X10 WHT LF (GAUZE/BANDAGES/DRESSINGS) ×2 IMPLANT
TAPE PAPER 2X10 WHT MICROPORE (GAUZE/BANDAGES/DRESSINGS) ×2 IMPLANT
TAPE UMBILICAL COTTON 1/8X30 (MISCELLANEOUS) ×1 IMPLANT
TOWEL GREEN STERILE (TOWEL DISPOSABLE) ×9 IMPLANT
TOWEL GREEN STERILE FF (TOWEL DISPOSABLE) ×5 IMPLANT
TOWEL OR 17X24 6PK STRL BLUE (TOWEL DISPOSABLE) ×6 IMPLANT
TOWEL OR 17X26 10 PK STRL BLUE (TOWEL DISPOSABLE) ×6 IMPLANT
TRAY FOLEY SILVER 14FR TEMP (SET/KITS/TRAYS/PACK) ×2 IMPLANT
TRAY FOLEY SILVER 16FR TEMP (SET/KITS/TRAYS/PACK) ×3 IMPLANT
TRAY FOLEY W/METER SILVER 16FR (SET/KITS/TRAYS/PACK) ×2 IMPLANT
TUBE FEEDING 8FR 16IN STR KANG (MISCELLANEOUS) ×3 IMPLANT
TUBE SUCT INTRACARD DLP 20F (MISCELLANEOUS) ×2 IMPLANT
TUBING HIGH PRESSURE 120CM (CONNECTOR) ×4 IMPLANT
UNDERPAD 30X30 (UNDERPADS AND DIAPERS) ×3 IMPLANT
VENT LEFT HEART 12002 (CATHETERS) ×3
WATER STERILE IRR 1000ML POUR (IV SOLUTION) ×6 IMPLANT
WIRE BENTSON .035X145CM (WIRE) ×1 IMPLANT
WIRE STIFF LUNDERQUIST 260CM (WIRE) ×1 IMPLANT

## 2017-01-21 NOTE — Brief Op Note (Signed)
      [address]       [name] [address]             [phone]      01/21/2017  9:44 PM  PATIENT:  Victor [name]  61 y.o. male  PRE-OPERATIVE DIAGNOSIS:  TAA  POST-OPERATIVE DIAGNOSIS:  TAA  PROCEDURE:  Procedure(s): AORTIC ARCH DEBRANCHING (N/A) Replacement of Aortic Arch with circulatory arrest (N/A) TRANSESOPHAGEAL ECHOCARDIOGRAM (TEE) (N/A) THORACIC AORTIC ENDOVASCULAR STENT GRAFT (N/A) REDO STERNOTOMY (N/A)  SURGEON:  Surgeon(s) and Role: Panel 1:    * [name], MD - Primary    * Nada Libman, MD - Co-Surgeon (only for TAVR, EVAR, and Barostim procedure)  Panel 2:    * Nada Libman, MD - Primary    * [name], MD - Co-Surgeon (only for TAVR, EVAR, and Barostim procedure)   ANESTHESIA:   general  EBL:  Total I/O In: 4481 [I.V.:2500; Blood:1981] Out: 2100 [Urine:600; Blood:1500]  BLOOD ADMINISTERED:plts, factro 7, cryo, ffp CC PRBC, 2 FFP and 1 PLTS  DRAINS: 2  Chest Tube(s) in the mediastinum   LOCAL MEDICATIONS USED:  NONE  SPECIMEN:  Source of Specimen:  aorta  DISPOSITION OF SPECIMEN:  PATHOLOGY  COUNTS:  YES  TOURNIQUET:  * No tourniquets in log *  DICTATION: .Dragon Dictation  PLAN OF CARE: Admit to inpatient   PATIENT DISPOSITION:  ICU - extubated and stable.   Delay start of Pharmacological VTE agent (>24hrs) due to surgical blood loss or risk of bleeding: yes

## 2017-01-21 NOTE — Anesthesia Procedure Notes (Signed)
Arterial Line Insertion Start/End9/28/2018 7:00 AM, 01/21/2017 7:10 AM Performed by: Reine Just, CRNA  Patient location: Pre-op. Preanesthetic checklist: patient identified, IV checked, site marked, risks and benefits discussed, surgical consent, monitors and equipment checked and pre-op evaluation Lidocaine 1% used for infiltration and patient sedated Left, radial was placed Catheter size: 20 G Hand hygiene performed  and maximum sterile barriers used  Allen's test indicative of satisfactory collateral circulation Attempts: 2 Procedure performed without using ultrasound guided technique. Following insertion, dressing applied and Biopatch. Post procedure assessment: normal  Patient tolerated the procedure well with no immediate complications.

## 2017-01-21 NOTE — H&P (View-Only) (Signed)
[address]       [place] [address]             [phone]                    RIDGE LAFOND [facility] Medical Record #[id number] Date of Birth: [birth year] Referring: Chrystie Nose, MD Primary Care: Billee Cashing, MD  Chief Complaint:    Aortic Arch Aneurysm   10/06/2014 OPERATIVE REPORT PREOPERATIVE DIAGNOSIS: Acute type 1 aortic dissection originating just at the takeoff of the right coronary ostium extending into the right external iliac artery. 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 SURGICAL PROCEDURE: Repair of ascending aortic dissection with replacement of ascending aorta and resuspension of the aortic valve with cardiopulmonary bypass and hypothermic circulatory arrest. SURGEON: Sheliah Plane, MD  History of Present Illness:    Victor [name] 61 y.o. 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 Since surgery the patient has stopped smoking.   Since last seen he has consulted with vascular surgery about a combined the branching of the aortic arch and placement of thoracic aortic stent graft for enlarging distal arch aorta.  Cardiac cath has been done without significant coronary artery disease.  Current Activity/ Functional Status:  Patient is independent with mobility/ambulation, transfers, ADL's, IADL's.   Zubrod Score: At the time of surgery this patient's most appropriate activity status/level should be described as: []     0    Normal activity, no symptoms [x]     1    Restricted in physical strenuous activity but ambulatory, able to do out light work []     2    Ambulatory and capable of self care, unable to do work activities, up and  about               >50 % of waking hours                              []     3    Only limited self care, in bed greater than 50% of waking hours []     4    Completely disabled, no self care, confined to bed or chair []     5    Moribund   Past Medical History:  Diagnosis Date  . History of kidney stones   . Hypertension   . Thoracic aortic aneurysm (HCC) 09/2014    Past Surgical History:  Procedure Laterality Date  . CARDIAC CATHETERIZATION    . FEMORAL-FEMORAL BYPASS GRAFT N/A 10/07/2014   Procedure: LEFT  FEMORAL ARTERY -RIGHT FEMORAL ARTERY BYPASS GRAFT USING X 30 CM HEMASHIELD GOLD GRAFT;  Surgeon: Larina Earthly, MD;  Location: [facility] OR;  Service: Vascular;  Laterality: N/A;  . RIGHT/LEFT HEART CATH AND CORONARY ANGIOGRAPHY N/A 01/11/2017   Procedure: RIGHT/LEFT HEART CATH AND CORONARY ANGIOGRAPHY- Diagnostic Only;  Surgeon: [name], MD;  Location: [facility] INV[facility] CV LAB;  Service: Cardiovascular;  Laterality: N/A;  . THORACIC AORTIC ANEURYSM REPAIR N/A 10/06/2014   Procedure: THORACIC ASCENDING ANEURYSM REPAIR (AAA);  Surgeon: [name], MD;  Location: [facility] OR;  Service: Open Heart Surgery;  Laterality: N/A;  hyportermia circulatory arrest and resuspension of aortic valve    Family History  Problem Relation Age of Onset  . Heart murmur Mother     Social History   Social History  . Marital status: Single    Spouse name: N/A  . Number of children: N/A  . Years of education: N/A   Occupational History  . Not on file.   Social History Main Topics  . Smoking status: Former Smoker    Years: 35.00    Types: Cigarettes, Cigars    Quit date: 10/07/2014  . Smokeless tobacco: Never Used  . Alcohol use No  . Drug use: No  . Sexual activity: Not on file   Other Topics Concern  . Not on file   Social History Narrative  . No narrative on file    History  Smoking Status  . Former Smoker  . Years: 35.00  . Types: Cigarettes, Cigars  . Quit date: 10/07/2014    Smokeless Tobacco  . Never Used    History  Alcohol Use No     No Known Allergies  Current Facility-Administered Medications  Medication Dose Route Frequency Provider Last Rate Last Dose  . 0.9 %  sodium chloride infusion   Intravenous Continuous Nada Libman, MD      . cefUROXime (ZINACEF) 1.5 g in dextrose 5 % 50 mL IVPB  1.5 g Intravenous To OR [name], MD      . cefUROXime (ZINACEF) 1.5 g in dextrose 5 % 50 mL IVPB  1.5 g Intravenous 30 min Pre-Op [name], MD      . cefUROXime (ZINACEF) 750 mg in dextrose 5 % 50 mL IVPB  750 mg Intravenous To OR [name], MD      . chlorhexidine (HIBICLENS) 4 % liquid 2 application  30 mL Topical UD [name], MD      . chlorhexidine (HIBICLENS) 4 % liquid 4 application  60 mL Topical Once Nada Libman, MD       And  . Melene Muller ON 01/22/2017] chlorhexidine (HIBICLENS) 4 % liquid 4 application  60 mL Topical Once Nada Libman, MD      . dexmedetomidine (PRECEDEX) 400 MCG/100ML (4 mcg/mL) infusion  0.1-0.7 mcg/kg/hr Intravenous To OR [name], MD      . DOPamine (INTROPIN) 800 mg in dextrose 5 % 250 mL (3.2 mg/mL) infusion  0-10 mcg/kg/min Intravenous To OR [name], MD      . EPINEPHrine (ADRENALIN) 4 mg in dextrose 5 % 250 mL (0.016 mg/mL) infusion  0-10 mcg/min Intravenous To OR [name], MD      . heparin 2,500 Units, papaverine 30 mg in electrolyte-148 (PLASMALYTE-148) 500 mL irrigation   Irrigation To OR [name], MD      . heparin 30,000 units/NS 1000 mL solution for CELLSAVER   Other To OR [name], MD      . insulin regular (NOVOLIN R,HUMULIN R) 100 Units in sodium chloride 0.9 % 100 mL (1 Units/mL) infusion   Intravenous To OR [name], MD      . magnesium sulfate (IV Push/IM) injection 40 mEq  40 mEq Other To OR [name], MD      . nitroGLYCERIN 50 mg in dextrose 5 % 250 mL (0.2 mg/mL) infusion  2-200 mcg/min Intravenous To  OR [name], MD      . phenylephrine (NEO-SYNEPHRINE) 20 mg in sodium chloride  0.9 % 250 mL (0.08 mg/mL) infusion  30-200 mcg/min Intravenous To OR [name], MD      . potassium chloride injection 80 mEq  80 mEq Other To OR [name], MD      . tranexamic acid (CYKLOKAPRON) 2,500 mg in sodium chloride 0.9 % 250 mL (10 mg/mL) infusion  1.5 mg/kg/hr Intravenous To OR [name], MD      . tranexamic acid (CYKLOKAPRON) bolus via infusion - over 30 minutes 1,116 mg  15 mg/kg Intravenous To OR [name], MD      . tranexamic acid (CYKLOKAPRON) pump prime solution 149 mg  2 mg/kg Intracatheter To OR [name], MD      . vancomycin (VANCOCIN) 1,250 mg in sodium chloride 0.9 % 250 mL IVPB  1,250 mg Intravenous To OR [name], MD       Facility-Administered Medications Ordered in Other Encounters  Medication Dose Route Frequency Provider Last Rate Last Dose  . fentaNYL (SUBLIMAZE) injection   Intravenous Anesthesia Intra-op Flowers, Rokoshi T, CRNA   50 mcg at 01/21/17 0654  . midazolam (VERSED) 5 MG/5ML injection    Anesthesia Intra-op Flowers, Rokoshi T, CRNA   2 mg at 01/21/17 1610   Prescriptions Prior to Admission  Medication Sig Dispense Refill Last Dose  . aspirin EC 325 MG EC tablet Take 1 tablet (325 mg total) by mouth daily.   01/20/2017 at Unknown time  . metoprolol tartrate (LOPRESSOR) 25 MG tablet take 1 tablet by mouth twice a day 180 tablet 0 01/20/2017 at Unknown time  . oxyCODONE-acetaminophen (PERCOCET) 10-325 MG tablet Take 1 tablet by mouth every 8 (eight) hours as needed for pain.   01/20/2017 at Unknown time     Review of Systems:     Cardiac Review of Systems: Y or N  Chest Pain [  n  ]  Resting SOB [  n ] Exertional SOB  [n Orthopnea [  n]   Pedal Edema [n   ]    Palpitations Victor [name]  ] Syncope  [ n ]   Presyncope [ n  ]  General Review of Systems: [Y] = yes [  ]=no Constitional: recent weight change [  ];  Wt loss  over the last 3 months [   ] anorexia [  ]; fatigue [n]; nausea [  ]; night sweats [  ]; fever [  ]; or chills [  ];          Dental: poor dentition[  ]; Last Dentist visit:   Eye : blurred vision [n  ]; diplopia Victor [name]   ]; vision changes [ n ];  Amaurosis fugax[n  ]; Resp: cough [  ];  wheezing[  ];  hemoptysis[  ]; shortness of breath[  ]; paroxysmal nocturnal dyspnea[ n ]; dyspnea on exertion[ n ]; or orthopnea[  ];  GI:  gallstones[  ], vomiting[  ];  dysphagia[  ]; melena[  ];  hematochezia [  ]; heartburn[  ];   Hx of  Colonoscopy[  ]; GU: kidney stones [  ]; hematuria[ n ];   dysuria [ n ];  nocturia[  ];  history of     obstruction [  ]; urinary frequency [  ]             Skin: rash, swelling[  ];, hair loss[  ];  peripheral edema[  ];  or itching[  ]; Musculosketetal: myalgias[ n ];  joint  swelling[ n ];  joint erythema[n  ];  joint pain[  ];  back pain[n  ];  Heme/Lymph: bruising[  ];  bleeding[  ];  anemia[  ];  Neuro: TIA[  ];  headaches[  ];  stroke[  ];  vertigo[  ];  seizures[  ];   paresthesias[n  ];  difficulty walking[ n ];  Psych:depression[  ]; anxiety[  ];  Endocrine: diabetes[  ];  thyroid dysfunction[  ];  Immunizations: Flu up to date [ n ]; Pneumococcal up to date Victor [name]  ];  Other:  Physical Exam: BP (!) 149/95   Pulse 84   Temp 98 F (36.7 C) (Oral)   Resp 18   Ht 5\' 7"  (1.702 m)   Wt 166 lb (75.3 kg)   SpO2 100%   BMI 26.00 kg/m   PHYSICAL EXAMINATION: Physical Exam  Constitutional: He is oriented to person, place, and time. He appears well-nourished. No distress.  HENT:  Mouth/Throat: No oropharyngeal exudate.  Eyes: Left eye exhibits no discharge. No scleral icterus.  Neck: No JVD present. No tracheal deviation present. No thyromegaly present.  Cardiovascular: Normal rate, regular rhythm, normal heart sounds and intact distal pulses.  Exam reveals no gallop and no friction rub.   No murmur heard. Respiratory: Effort normal and breath sounds normal. No  stridor. No respiratory distress. He has no wheezes. He has no rales. He exhibits no tenderness.  GI: Soft. Bowel sounds are normal. He exhibits no distension. There is no tenderness. There is no rebound and no guarding.  Musculoskeletal: He exhibits no edema, tenderness or deformity.  Lymphadenopathy:    He has no cervical adenopathy.  Neurological: He is alert and oriented to person, place, and time.  Skin: Skin is warm and dry. He is not diaphoretic.  Psychiatric: He has a normal mood and affect. His behavior is normal. Judgment and thought content normal.  Large left inguinal hernia, palpable 1+ DP and PT pulses at the right ankle 2+ DP and PT pulses at the left ankle, he has bilateral femoral pulses but no palpable pulse in the femorofemoral crossover graft Incision in the right infraclavicular area from previous right axillary artery cannulation   Diagnostic Studies & Laboratory data:     Recent Radiology Findings:  Mr Abdomen Wwo Contrast  Result Date: 01/06/2017 CLINICAL DATA:  Renal lesion on recent CT scan. EXAM: MRI ABDOMEN WITHOUT AND WITH CONTRAST TECHNIQUE: Multiplanar multisequence MR imaging of the abdomen was performed both before and after the administration of intravenous contrast. CONTRAST:  15mL MULTIHANCE GADOBENATE DIMEGLUMINE 529 MG/ML IV SOLN COMPARISON:  CTA from 12/09/2016 FINDINGS: Lower chest:  No evidence for pleural effusion. Hepatobiliary: Liver unremarkable although incompletely evaluated on this dedicated renal study. There is no evidence for gallstones, gallbladder wall thickening, or pericholecystic fluid. No intrahepatic or extrahepatic biliary dilation. Pancreas: No focal mass lesion. No dilatation of the main duct. No intraparenchymal cyst. No peripancreatic edema. Spleen: Visualized portions of the spleen are unremarkable. Adrenals/Urinary Tract: No adrenal nodule or mass. Right renal lesion of concern on prior CTA is identified towards the lower pole the  right kidney. This 2.0 x 1.8 cm T1 hyperintense, T2 variable intensity, partially septated lesion shows layering debris or hemorrhage. No substantial enhancement within the lesion although the most delayed postcontrast subtraction imaging suggests possible small enhancing nodules (image 34 series 19 and image 37 series 19). Scattered small simple cysts are identified in the left kidney. No enhancing left renal mass. Stomach/Bowel: Stomach is  nondistended. No gastric wall thickening. No evidence of outlet obstruction. Duodenum is normally positioned as is the ligament of Treitz. No small bowel or colonic dilatation within the visualized abdomen. Vascular/Lymphatic: Thoracolumbar aortic dissection better characterized on the recent CTA exam. Other: No intraperitoneal free fluid. Musculoskeletal: No abnormal marrow signal within the visualized bony anatomy. IMPRESSION: 1. 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 2. Small simple cysts (Bosniak I) in the left kidney. 3. Thoracoabdominal aortic dissection as better characterized on recent CTA. Electronically Signed   By: Kennith Center M.D.   On: 01/06/2017 08:44  reviwed with Dr Warner Mccreedy urology, follow scans recommended   Result Date: 12/09/2016 CLINICAL DATA:  61 year old male with chronic aortic dissection. EXAM: CT ANGIOGRAPHY CHEST, ABDOMEN AND PELVIS TECHNIQUE: Multidetector CT imaging through the chest, abdomen and pelvis was performed using the standard protocol during bolus administration of intravenous contrast. Multiplanar reconstructed images and MIPs were obtained and reviewed to evaluate the vascular anatomy. Creatinine was obtained on site at [facility] at [address] Results: Creatinine 1.2 mg/dL. CONTRAST:  75 mL Isovue 370 COMPARISON:  Most recent prior chest CTA 05/27/2016 ; most recent prior CT scan of the chest, abdomen and pelvis 04/10/2015 FINDINGS: CTA CHEST FINDINGS Cardiovascular:  Surgical changes of prior operative repair of the ascending portion of the known Stanford a thoracic aortic aneurysm. The tube graft remains unchanged in appearance. The dissection flap begins immediately beyond the distal anastomosis and extends throughout the thoracic aorta and into the abdominal aorta. Progressive aneurysmal dilatation of the aortic infundibulum now measuring 5.6 cm compared to 5.3 cm previously (confirmed on coronal reformatted images). The aneurysmal descending thoracic aorta remains unchanged in caliber measuring 5.5 cm at the level of the origin of the main pulmonary artery and 4.5 cm just proximal to the aortic hiatus. The arch vessels remain tortuous. The dissection flap does not extend into the vessels. Main pulmonary artery remains normal in size. The heart remains normal in size. No pericardial effusion. Mediastinum/Nodes: Similar appearance of heterogeneous multinodular thyroid gland. Mildly prominent right para-aortic lymph nodes again noted without significant change compared to 2016. These are likely reactive. No new adenopathy or mediastinal mass. Unremarkable thoracic esophagus. Lungs/Pleura: Paraseptal pulmonary emphysema with bullous change at the right lung apex. No evidence of pleural effusion, focal airspace consolidation, pulmonary edema or pneumothorax. Respiratory motion artifact noted in the lower lungs bilaterally. No suspicious nodule or mass. Musculoskeletal: No acute fracture or aggressive appearing lytic or blastic osseous lesion. Healed median sternotomy. Review of the MIP images confirms the above findings. CTA ABDOMEN AND PELVIS FINDINGS VASCULAR Aorta: Chronic dissection of the the abdominal aorta again noted. The dissection flap extends into the infrarenal abdominal aorta and terminates proximal to the inferior mesenteric artery. There has been some remodeling of the intramural hematoma in the distal aorta compared to December of 2016. The visceral aorta remains  mildly aneurysmal at 4.7 cm which is unchanged compared to prior. The remainder of the aorta remains within normal limits in size. Celiac: Fenestration of the dissection flap at the origin of the celiac artery. Thus, the celiac artery arises from both the true and false lumen. Widely patent. No aneurysm. SMA: Arises from the true lumen. Widely patent. No aneurysm or dissection. Renals: Fenestration at the level of the renal arteries providing cross flow. The right renal artery arises from the false lumen while the left renal artery arises from the true lumen. The renal arteries  are normal in caliber. No evidence of dissection or fibromuscular dysplasia. IMA: Patent and unremarkable. Inflow: Relatively spared from disease.  Widely patent. Outflow: Ectasia of the bilateral common femoral artery is likely at sites of prior endarterectomy. There is a completely excluded fem-fem bypass graft. The visualized portions of the superficial and profunda femoral arteries are widely patent. Veins: No obvious venous abnormality within the limitations of this arterial phase study. Review of the MIP images confirms the above findings. NON-VASCULAR Hepatobiliary: Normal hepatic contour and morphology. No discrete hepatic lesions. Normal appearance of the gallbladder. No intra or extrahepatic biliary ductal dilatation. Pancreas: Unremarkable. No pancreatic ductal dilatation or surrounding inflammatory changes. Spleen: Normal in size without focal abnormality. Adrenals/Urinary Tract: Normal adrenal glands. Differential enhancement. There is relatively decreased enhancement of the right renal parenchyma. No evidence of hydronephrosis or nephrolithiasis. 2.2 cm intermediate attenuation in the lower pole of the right kidney has slowly grown in size compared to the initial CT scan from June of 2016 at which time it measured 1.6 cm. Small low-attenuation cyst exophytic from the upper pole of the left kidney remains unchanged and is likely  a simple cyst. Unremarkable ureters and bladder. Stomach/Bowel: No focal bowel wall thickening or evidence of obstruction. Large left inguinal hernia containing multiple loops of small bowel and mesenteric. Normal appendix in the right lower quadrant. Lymphatic: No suspicious lymphadenopathy. Reproductive: Prostate is unremarkable. Other: Large left inguinal hernia.  Negative for ascites. Musculoskeletal: No acute fracture or aggressive appearing lytic or blastic osseous lesion. Right-sided facet arthropathy at L5-S1. Review of the MIP images confirms the above findings. IMPRESSION: CTA CHEST 1. Enlarging chronic aneurysmal thoracic aortic dissection. The aortic infundibulum now measures 5.6 cm compared to 5.3 cm on 05/27/2016 and 4.1 cm at the time of presentation in June of 2016. No evidence of propagation of the dissection flap. 2. Prior surgical repair of Stanford type A ascending thoracic aortic aneurysm with stable appearance of the ascending aortic tube graft. 3. Stable chronic aneurysmal dissection of the descending thoracic aorta. 4. Paraseptal emphysema with bullous change in the right lung apex. CTA ABD/PELVIS 1. Slight interval remodeling of the distal extent of the abdominal aortic dissection. Previously, the dissection flap extended more inferiorly as a thrombosed false lumen/intramural hematoma. The intramural hematoma component has resorbed over time and there is less overall dilatation of the distal abdominal aorta in the region of the inferior mesenteric artery. Otherwise, stable dissection flap and ectasia of the super renal and juxtarenal abdominal aorta. No progression of aneurysmal dilatation or propagation of the dissection flap. 2. Enlarging intermediate attenuation lesion within the lower pole of the right kidney. This remains incompletely evaluated having not been included on prior noncontrast CT images. While it is possible that this represents a slowly enlarging complex cyst, a low grade  renal neoplasm such as a papillary cell renal carcinoma is also a distinct possibility. Recommend further evaluation with renal protocol MRI of the abdomen with gadolinium contrast. 3. Persistent differential enhancement of the kidneys with relatively decreased enhancement of the right kidney (the right renal artery arises from the false lumen). No evidence of renal cortical atrophy. 4. Stable appearance of large left inguinal hernia containing multiple loops of small bowel and associated mesentery. 5. Additional ancillary findings as above without significant interval change. These results were called by telephone at the time of interpretation on 12/09/2016 at 12:14 pm to Dr. Sheliah Plane , who verbally acknowledged these results. Signed, Sterling Big, MD [facility] Electronically Signed   By: Malachy Moan M.D.   On: 12/09/2016 12:14   I have independently reviewed the above radiology studies  and reviewed the findings with the patient.   Ct Angio Chest Aorta W/cm &/or Wo/cm  Result Date: 05/27/2016 CLINICAL DATA:  61 year old male with history of thoracic aortic dissection status post surgical repair in February 2016. Followup study. EXAM: CT ANGIOGRAPHY CHEST WITH CONTRAST TECHNIQUE: Multidetector CT imaging of the chest was performed using the standard protocol during bolus administration of intravenous contrast. Multiplanar CT image reconstructions and MIPs were obtained to evaluate the vascular anatomy. CONTRAST:  100 mL of Isovue 370. COMPARISON:  Chest CT 08/28/2015. FINDINGS: Cardiovascular: Heart size is normal. There is no significant pericardial fluid, thickening or pericardial calcification. There is aortic atherosclerosis, as well as atherosclerosis of the great vessels of the mediastinum and the coronary arteries, including calcified atherosclerotic plaque in the left anterior descending and right coronary arteries. Status  post median sternotomy for graft replacement of the ascending thoracic aorta. The proximal and distal anastomoses of the graft are intact. No abnormal fluid collection around the graft. Immediately distal to the distal anastomosis there is an aortic dissection, which extends into the abdominal aorta (below the lower margin of the images). The dissection flap does not extend into the great vessels. The dissection flap over rides the origin of the celiac axis which appears to be fed by the false lumen. Aneurysmal dilatation of the distal aortic arch which measures up to 5.3 cm in diameter. There is also aneurysmal dilatation of the descending thoracic aorta which measures up to 4.2 x 4.7 cm (mean diameter of 4.5 cm). On the precontrast images there is no crescentic high attenuation associated with the wall of the thoracic aorta to suggest acute intramural hemorrhage. Mediastinum/Nodes: Multiple borderline enlarged and mildly enlarged mediastinal lymph nodes measuring up to 1 cm in short axis in the right paratracheal nodal stations, similar to the prior study. Esophagus is unremarkable in appearance. No axillary lymphadenopathy. Lungs/Pleura: New 3 x 5 mm (mean diameter 4 mm) pulmonary nodule in the right middle lobe (image 103 of series 14). No other larger more suspicious appearing pulmonary nodules or masses are noted. Scattered areas of mild linear scarring are noted throughout the lung bases. No acute consolidative airspace disease. No pleural effusions. Moderate paraseptal emphysema most notable in the lung apices. Upper Abdomen: Unremarkable. Musculoskeletal: Median sternotomy wires. There are no aggressive appearing lytic or blastic lesions noted in the visualized portions of the skeleton. Review of the MIP images confirms the above findings. IMPRESSION: 1. Chronic type A thoracic aortic dissection status post graft repair of the ascending thoracic aorta, with persistent aneurysmal dilatation of the distal  aortic arch (5.3 cm in diameter) and the descending thoracic aorta (mean diameter 4.5 cm), as above. No acute findings. 2. 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 3. Moderate paraseptal emphysema. 4.  2 vessel coronary artery disease. Electronically Signed   By: Trudie Reed M.D.   On: 05/27/2016 12:21   Ct Angio Chest Aorta W/cm &/or Wo/cm  08/28/2015  CLINICAL DATA:  Followup thoracic aneurysm repair EXAM: CT ANGIOGRAPHY CHEST WITH CONTRAST TECHNIQUE: Multidetector CT imaging of the chest was performed using the standard protocol during bolus administration of intravenous contrast. Multiplanar CT image reconstructions and MIPs were obtained to evaluate the vascular anatomy. CONTRAST:  75 mL Isovue 370 COMPARISON:  04/10/2015  FINDINGS: Lungs are well aerated bilaterally. Diffuse emphysematous changes are seen. A 6 mm nodule is again noted in the right middle lobe best seen on image number 80 of series 5. Thoracic inlet again demonstrates the thyroid to be somewhat enlarged in size. Previously seen left thyroid nodule is again noted and stable. There again noted changes consistent with ascending aortic repair. Persistent dissection flap is noted in the mid thoracic arch extending into the descending thoracic aorta. This is stable in appearance from the prior exam. The false lumen occupies the majority of the descending thoracic aorta. The brachiocephalic vessels are supplied by the true lumen. As is the celiac axis and superior mesenteric artery. The overall appearance of the aorta is stable from the prior exam. No new focal abnormality is seen. The pulmonary artery as visualized is within normal limits. No hilar or mediastinal adenopathy is noted. The visualized upper abdomen demonstrates decreased perfusion of the right kidney similar to that seen on the prior exam likely related to supplied from the false  lumen. The osseous structures show no acute abnormality. Review of the MIP images confirms the above findings. IMPRESSION: Emphysematous changes. Stable 6 mm nodule in the right middle lobe. Stable changes of ascending aortic repair. There is a persistent dissection flap extending from the aortic arch to the descending aorta. This is stable from the prior exam. No new focal abnormality is noted. Electronically Signed   By: Alcide Clever M.D.   On: 08/28/2015 08:51    Ct Angio Chest Aorta W/cm &/or Wo/cm  04/10/2015  CLINICAL DATA:  History of dissection abdominal aortic aneurysm. Former smoker. EXAM: CT ANGIOGRAPHY CHEST, ABDOMEN AND PELVIS TECHNIQUE: Multidetector CT imaging through the chest, abdomen and pelvis was performed using the standard protocol during bolus administration of intravenous contrast. Multiplanar reconstructed images and MIPs were obtained and reviewed to evaluate the vascular anatomy. CONTRAST:  75 cc SVC 70 COMPARISON:  CT the chest, abdomen pelvis - 10/06/2014 FINDINGS: CTA CHEST FINDINGS Vascular Findings: The patient has undergone open repair of the ascending thoracic aorta. The bypass graft is widely patent. There is a minimal amount of ill-defined stranding within the anterior mediastinum which is favored to be postoperative. No contrast extravasation. Review of the precontrast images are negative for the presence of an intramural hematoma. There is a persistent dissection involving the cranial most aspect of the ascending thoracic aorta extending through the aortic arch and the descending thoracic aorta to the level of the mid/distal aspect of the abdominal aorta. Both the true and false lumens of the abdominal aortic dissection remain widely patent through the level of the descending thoracic aorta. There has been interval enlargement of the aortic arch and descending thoracic aorta. The aortic arch now measures approximately 48 mm in diameter (previously, 34 mm) while the distal  descending thoracic aorta now measures approximately 37 mm, previously, 32 mm. No evidence of periaortic stranding. Normal heart size. No pericardial effusion. Although this examination was not tailored for the evaluation the pulmonary arteries, there are no discrete filling defects within the central pulmonary arterial tree to suggest central pulmonary embolism. ------------------------------------------------------------- Thoracic aortic measurements: Sinotubular junction 30 mm measured in greatest oblique coronal dimension. Proximal ascending aorta The bypass graft measures approximately 35 mm in greatest oblique short axis diameter as measured in greatest oblique axial dimension at the level of the main pulmonary artery. Aortic arch aorta 48 mm as measured in greatest oblique sagittal dimension, previously 34 mm, an approximately 46 mm in greatest  oblique short axis axial diameter (image 36, series 4), previously, 35 mm. Proximal descending thoracic aorta 41 mm as measured in greatest oblique axial dimension at the level of the main pulmonary artery, previously, 36 mm Distal descending thoracic aorta 37 mm as measured in greatest oblique axial dimension at the level of the diaphragmatic hiatus, previously 32 mm Review of the MIP images confirms the above findings. ------------------------------------------------------------- Non-Vascular Findings: Minimal dependent subpleural ground-glass atelectasis, left greater than right. No discrete focal airspace opacities. No pleural effusion or pneumothorax. There is minimal subsegmental atelectasis along the right minor fissure. Mild apical predominant paraseptal and centrilobular emphysematous change, right greater than left. Punctate (approximately 0.6 cm) noncalcified ground-glass nodule within in the right middle lobe (image 39, series 5), unchanged since the 09/2010/2016 examination. Scattered prominent mediastinal lymph nodes with index right sided precarinal  lymph node measuring 1.1 cm in greatest short axis diameter (image 45, series 4). Additional scattered mediastinal lymph nodes are numerous though individually not enlarged by size criteria with index precarinal lymph node measuring 0.8 cm in greatest short axis diameter, likely reactive in etiology. No hilar axillary lymphadenopathy. Post median sternotomy. No acute or aggressive osseous abnormalities. There is diffuse heterogeneity of the thyroid gland with a suspected punctate (approximately 0.9 cm) hypo attenuating nodule within the left lobe of the thyroid (image 15, series 4). Review of the MIP images confirms the above findings. --------------------------------------------------------------------------------- CTA ABDOMEN AND PELVIS FINDINGS Vascular Findings: Abdominal aorta: The thoracic aortic dissection extends through the mid/ distal aspects of the abdominal aorta. The false lumen of the thoracic aortic dissection is again noted to supply the right renal artery as well as the majority of supplied to the celiac artery. While there is expected slightly delayed enhancement of the right kidney in relation to the left, there is no evidence of end organ ischemic change. The dissection is again noted to abut but not extend into the origin of the celiac artery as well as the SMA. Interval increase in size of the abdominal aorta now measuring 3 cm in diameter (image 147, series 4), previously, 2.3 cm, an approximately 3 cm in greatest oblique coronal short axis diameter (image 69, series 601), previously, 2.4 cm. The dissection terminates at the level just caudal to the take-off of the IMA. The distal abdominal aorta is widely patent as is the previously thrombosed right common and external iliac arteries. Celiac artery: As above, the dissection extends to abut the origin of the celiac artery though not extend into the vessel and not definitely result in hemodynamically significant stenosis. The supply to the  celiac artery is primarily via the false lumen. Conventional branching pattern. SMA: Supplied via the true lumen. Conventional branching pattern. The distal tributaries the SMA are widely patent without discrete intraluminal filling defect to suggest distal embolism. Right Renal artery: Solitary; supplied by the false lumen. Widely patent without hemodynamically significant narrowing. No vessel irregularity to suggest FMD. Left Renal artery: Duplicated ; both right-sided renal arteries are supplied by the true lumen. Both co-dominant left-sided renal arteries are widely patent without hemodynamically significant narrowing. No vessel irregularity to suggest FMD. IMA: Supplied by the true lumen. Widely patent without hemodynamically significant narrowing. Pelvic vasculature: As above, there is no longer extension of the thoracic aortic dissection thrombus to involve the right common and external iliac arteries. These vessels now remain widely patent. The bilateral external and internal iliac arteries are widely patent without hemodynamically significant narrowing. There is a thrombosed fem fem bypass graft  with mild aneurysmal dilatation adjacent to the anastomosis. The bilateral deep and superficial femoral arteries are widely patent throughout their imaged course. Review of the MIP images confirms the above findings. -------------------------------------------------------------------------------- Nonvascular Findings: Evaluation of the abdominal organs is largely limited to the arterial phase of enhancement. Normal hepatic contour. No discrete hyper enhancing hepatic lesions. Normal noncontrast appearance of the gallbladder. No radiopaque gallstones. No intra or extrahepatic biliary duct dilatation. No ascites. There is slightly delayed enhancement of the right kidney in comparison to the left secondary to the right kidney being supplied via the false lumen of the abdominal aortic dissection. No renal stones. No  urinary obstruction or perinephric stranding. Normal appearance of the bilateral adrenal glands, pancreas and spleen. Large left-sided indirect inguinal hernia which is noted to again contain multiple loops of nondilated small bowel, not resulting in enteric obstruction. Moderate to large colonic stool burden. The bowel is otherwise normal in course and caliber without wall thickening. Normal appearance of the terminal ileum and appendix. No pneumoperitoneum, pneumatosis or portal venous gas. No bulky retroperitoneal, mesenteric, pelvic or inguinal lymphadenopathy. Scattered calcifications within normal sized prostate gland. Normal appearance of the urinary bladder given degree distention. No free fluid in the pelvic cul-de-sac. No acute or aggressive osseous abnormalities within the abdomen or pelvis. Incidental note is made of a nondisplaced left-sided L5 pars defects without associated anterolisthesis. Regional soft tissues appear otherwise normal. Review of the MIP images confirms the above findings. IMPRESSION: Vascular Impression of the chest: 1. Post open repair of the ascending thoracic aorta without evidence of complication. 2. Persistent findings of a type A dissection originating immediately adjacent to the distal end of the open bypass graft. 3. Interval enlargement of the aortic arch and descending thoracic aorta (aortic arch now measures 48 mm in diameter, previously, 34 mm). No periaortic stranding. Nonvascular Impression of the chest: 1. No acute cardiopulmonary disease. 2. Moderate centrilobular and paraseptal emphysematous change. 3. Punctate (approximately 0.5 cm) nodule within the right middle lobe is unchanged since the 09/2014 examination. Given risk factors for bronchogenic carcinoma, follow-up chest CT at 6 - 12 months is recommended. This recommendation follows the consensus statement: Guidelines for Management of Small Pulmonary Nodules Detected on CT Scans: A Statement from the Fleischner  Society as published in Radiology 2005;237:395-400. 4. Punctate (approximately 0.9 cm) hypoattenuating nodule with the left lobe of the thyroid. Further evaluation with dedicated thyroid ultrasound could be performed as clinically indicated. ----------------------------------------------------------------------------------- Vascular Impression of the abdomen and pelvis: 1. There is persistent extension of the thoracic aortic dissection now terminating at the level of the mid/distal abdominal aorta. The dissection is again noted to abut the origin of both the celiac and SMA though not extend into either vessel. The right renal artery as well as the majority of the celiac artery are supplied via the false lumen. 2. Interval increase in size of the abdominal aorta now measuring 3 cm in diameter, previously, 2.3 cm. 3. The dissection no longer extends to involve the right common iliac artery. Both the right common and external iliac arteries are now appear widely patent. As such, the fem-fem bypass graft is occluded. Nonvascular Impression of the abdomen and pelvis: 1. Grossly unchanged large left-sided indirect inguinal hernia which is again noted to contain multiple loops of nondilated small bowel. Electronically Signed   By: Simonne Come M.D.   On: 04/10/2015 11:51    Recent Lab Findings: Lab Results  Component Value Date   WBC 5.0 01/19/2017  HGB 14.8 01/19/2017   HCT 43.9 01/19/2017   PLT 181 01/19/2017   GLUCOSE 86 01/19/2017   CHOL 121 12/23/2016   TRIG 57 12/23/2016   HDL 43 12/23/2016   LDLCALC 67 12/23/2016   ALT 16 (L) 01/19/2017   AST 17 01/19/2017   NA 137 01/19/2017   K 4.2 01/19/2017   CL 106 01/19/2017   CREATININE 1.11 01/19/2017   BUN 17 01/19/2017   CO2 21 (L) 01/19/2017   INR 1.13 01/19/2017   HGBA1C 5.8 (H) 01/19/2017  Transthoracic Echocardiography  Patient:    Victor [name], Victor [name] MR #:       [id number] Study Date: 12/23/2016 Gender:     M Age:        61 Height:      170.2 cm Weight:     75.3 kg BSA:        1.9 m^2 Pt. Status: Room:   ATTENDING    Kristeen Miss, M.D.  ORDERING     Sheliah Plane MD  REFERRING    Sheliah Plane MD  REFERRING    [place] [id number]  SONOGRAPHER  Aida Raider, RDCS  PERFORMING   [facility]  cc:  ------------------------------------------------------------------- LV EF: 60% -   65%  ------------------------------------------------------------------- Indications:      I35.1 Aortic Valve Insufficiency.  ------------------------------------------------------------------- History:   PMH:  Acquired from the patient and from the patient&'s chart.  PMH:  Thoracic Aortic Aneurysm.  ------------------------------------------------------------------- Study Conclusions  - Left ventricle: The cavity size was normal. Wall thickness was   increased in a pattern of mild LVH. Systolic function was normal.   The estimated ejection fraction was in the range of 60% to 65%.   Wall motion was normal; there were no regional wall motion   abnormalities. Features are consistent with a pseudonormal left   ventricular filling pattern, with concomitant abnormal relaxation   and increased filling pressure (grade 2 diastolic dysfunction). - Aortic valve: There was trivial regurgitation.  ------------------------------------------------------------------- Study data:   Study status:  Routine.  Procedure:  The patient reported no pain pre or post test. Transthoracic echocardiography for left ventricular function evaluation, for right ventricular function evaluation, and for assessment of valvular function. Image quality was adequate.  Study completion:  There were no complications.          Transthoracic echocardiography.  M-mode, complete 2D, spectral Doppler, and color Doppler.  Birthdate: Patient birthdate: 1956/01/08.  Age:  Patient is 61 yr old.  Sex: Gender: male.    BMI: 26 kg/m^2.  Blood pressure:      140/93 Patient status:  Outpatient.  Study date:  Study date: 12/23/2016. Study time: 09:35 AM.  Location:  [facility]  -------------------------------------------------------------------  ------------------------------------------------------------------- Left ventricle:  The cavity size was normal. Wall thickness was increased in a pattern of mild LVH. Systolic function was normal. The estimated ejection fraction was in the range of 60% to 65%. Wall motion was normal; there were no regional wall motion abnormalities. Features are consistent with a pseudonormal left ventricular filling pattern, with concomitant abnormal relaxation and increased filling pressure (grade 2 diastolic dysfunction).  ------------------------------------------------------------------- Aortic valve:   Structurally normal valve.   Cusp separation was normal.  Doppler:  Transvalvular velocity was within the normal range. There was no stenosis. There was trivial regurgitation.  ------------------------------------------------------------------- Aorta:  Aortic root: The aortic root was normal in size. Ascending aorta: The ascending aorta was normal in size.  ------------------------------------------------------------------- Mitral valve:   Structurally normal valve.  Leaflet separation was normal.  Doppler:  Transvalvular velocity was within the normal range. There was no evidence for stenosis. There was no regurgitation.  ------------------------------------------------------------------- Left atrium:  The atrium was normal in size.  ------------------------------------------------------------------- Right ventricle:  The cavity size was normal. Systolic function was normal.  ------------------------------------------------------------------- Pulmonic valve:    The valve appears to be grossly normal. Doppler:  There was no significant  regurgitation.  ------------------------------------------------------------------- Tricuspid valve:   The valve appears to be grossly normal. Doppler:  There was mild regurgitation.  ------------------------------------------------------------------- Pulmonary artery:   Systolic pressure was within the normal range.   ------------------------------------------------------------------- Right atrium:  The atrium was normal in size.  ------------------------------------------------------------------- Pericardium:  There was no pericardial effusion.  ------------------------------------------------------------------- Systemic veins: Inferior vena cava: The vessel was normal in size. The respirophasic diameter changes were in the normal range (>= 50%), consistent with normal central venous pressure.  ------------------------------------------------------------------- Measurements   Left ventricle                         Value        Reference  LV ID, ED, PLAX chordal        (L)     34.3  mm     43 - 52  LV ID, ES, PLAX chordal                26.1  mm     23 - 38  LV fx shortening, PLAX chordal (L)     24    %      >=29  LV PW thickness, ED                    11.6  mm     ---------  IVS/LV PW ratio, ED                    1.12         <=1.3  Stroke volume, 2D                      55    ml     ---------  Stroke volume/bsa, 2D                  29    ml/m^2 ---------  LV e&', lateral                         10.4  cm/s   ---------  LV E/e&', lateral                       6.79         ---------  LV e&', medial                          9.87  cm/s   ---------  LV E/e&', medial                        7.15         ---------  LV e&', average                         10.14 cm/s   ---------  LV E/e&', average  6.97         ---------    Ventricular septum                     Value        Reference  IVS thickness, ED                      13    mm     ---------    LVOT                                    Value        Reference  LVOT ID, S                             21    mm     ---------  LVOT area                              3.46  cm^2   ---------  LVOT ID                                21    mm     ---------  LVOT peak velocity, S                  82    cm/s   ---------  LVOT mean velocity, S                  60.7  cm/s   ---------  LVOT VTI, S                            15.8  cm     ---------  LVOT peak gradient, S                  3     mm Hg  ---------  Stroke volume (SV), LVOT DP            54.7  ml     ---------  Stroke index (SV/bsa), LVOT DP         28.8  ml/m^2 ---------    Aorta                                  Value        Reference  Aortic root ID, ED                     40    mm     ---------  Ascending aorta ID, A-P, S             32    mm     ---------    Left atrium                            Value        Reference  LA ID, A-P, ES  41    mm     ---------  LA ID/bsa, A-P                         2.16  cm/m^2 <=2.2  LA volume, S                           40    ml     ---------  LA volume/bsa, S                       21.1  ml/m^2 ---------  LA volume, ES, 1-p A4C                 47    ml     ---------  LA volume/bsa, ES, 1-p A4C             24.7  ml/m^2 ---------  LA volume, ES, 1-p A2C                 30    ml     ---------  LA volume/bsa, ES, 1-p A2C             15.8  ml/m^2 ---------    Mitral valve                           Value        Reference  Mitral E-wave peak velocity            70.6  cm/s   ---------  Mitral A-wave peak velocity            73.1  cm/s   ---------  Mitral deceleration time               197   ms     150 - 230  Mitral E/A ratio, peak                 1            ---------    Tricuspid valve                        Value        Reference  Tricuspid regurg peak velocity         248   cm/s   ---------  Tricuspid peak RV-RA gradient          25    mm Hg  ---------    Right ventricle                         Value        Reference  RV s&', lateral, S                      15.7  cm/s   ---------  Legend: (L)  and  (H)  mark values outside specified reference range.  ------------------------------------------------------------------- Prepared and Electronically Authenticated by  Kristeen Miss, M.D. 2018-08-30T13:38:49  CATH: 01/11/2017  [name], MD (Primary)    Procedures   RIGHT/LEFT HEART CATH AND CORONARY ANGIOGRAPHY- Diagnostic Only  Conclusion   1. Widely patent, angiographically normal, dominant RCA 2. Angiographically normal LAD and LCx 3. Mild narrowing at the ostium of the left main without pressure dampening 4. 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 The fem-femoral bypass is clotted but he has preserved flow to both lowe extremities without symptoms On exam the patient has no evidence of aortic insufficiency 2/ vessel coronary artery disease by ct , clear on recent catheterization 3/ the imaging of the renal cysts have been reviewed by Dr. Warner Mccreedy , no further evaluation at this time is necessary with continued postop evaluation of his aorta examination of the kidneys will also be suitable for follow-up.  I've again reviewed with the patient planned hybrid procedure with replacement of aortic arch and stent grafting on the proximal descending thoracic aorta because of the persistent and enlarging pulsatile movement of his aortic arch and proximal descending aorta risk risk of death infection stroke myocardial infarction bleeding blood transfusion possible recurrent nerve injury renal failure have all been discussed with the patient in detail he understands the risk of nonoperative treatment and is willing to proceed.   The goals risks and alternatives of the planned surgical procedure Procedure(s): AORTIC ARCH  DEBRANCHING (N/A) Replacement of Aortic Arch with circ arrest (N/A) TRANSESOPHAGEAL ECHOCARDIOGRAM (TEE) (N/A) THORACIC AORTIC ENDOVASCULAR STENT GRAFT (N/A)  have been discussed with the patient in detail. The risks of the procedure including death, infection, stroke, myocardial infarction, bleeding, blood transfusion,recurrent nerve injury renal failure  have all been discussed specifically.  I have quoted [name] a 6 % of perioperative mortality and a complication rate as high as 50 %. The patient's questions have been answered.IZEK CORVINO is willing  to proceed with the planned procedure.   [name] MD      [address] [place].[address] [place] [address] Office [phone]   Beeper [phone]  01/21/2017 7:12 AM

## 2017-01-21 NOTE — Anesthesia Procedure Notes (Signed)
Central Venous Catheter Insertion Performed by: Roberts Gaudy, anesthesiologist Start/End9/28/2018 7:00 AM, 01/21/2017 7:05 AM Patient location: OR. Preanesthetic checklist: patient identified, IV checked, site marked, risks and benefits discussed, surgical consent, monitors and equipment checked, pre-op evaluation and timeout performed Position: supine Hand hygiene performed , maximum sterile barriers used  and Seldinger technique used Catheter size: 9 Fr PA cath was placed.MAC introducer Procedure performed using ultrasound guided technique. Ultrasound Notes:anatomy identified, needle tip was noted to be adjacent to the nerve/plexus identified and no ultrasound evidence of intravascular and/or intraneural injection Attempts: 1 Following insertion, line sutured, dressing applied and Biopatch. Post procedure assessment: blood return through all ports, free fluid flow and no air  Patient tolerated the procedure well with no immediate complications.

## 2017-01-21 NOTE — Anesthesia Procedure Notes (Signed)
Procedure Name: Intubation Date/Time: 01/21/2017 7:51 AM Performed by: Reine Just Pre-anesthesia Checklist: Patient identified, Emergency Drugs available, Suction available and Patient being monitored Patient Re-evaluated:Patient Re-evaluated prior to induction Oxygen Delivery Method: Circle system utilized and Simple face mask Preoxygenation: Pre-oxygenation with 100% oxygen Induction Type: IV induction Ventilation: Mask ventilation without difficulty Laryngoscope Size: Miller and 3 Grade View: Grade II Tube type: Subglottic suction tube Tube size: 7.5 mm Number of attempts: 1 Airway Equipment and Method: Patient positioned with wedge pillow and Stylet Placement Confirmation: ETT inserted through vocal cords under direct vision,  positive ETCO2 and breath sounds checked- equal and bilateral Secured at: 22 cm Tube secured with: Tape Dental Injury: Teeth and Oropharynx as per pre-operative assessment  Difficulty Due To: Difficult Airway- due to limited oral opening and Difficult Airway- due to dentition Comments: Pt with poor dentition and several loose teeth, which made for more difficulty with DL. No damage to teeth or lips noted

## 2017-01-21 NOTE — Progress Notes (Signed)
Echocardiogram Transesophageal has been performed.  Victor [name] 01/21/2017, 8:56 AM

## 2017-01-21 NOTE — Op Note (Signed)
    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 Post-operative diagnosis:  Same Surgeon:  Durene Cal Co-Surgeon:  Ofilia Neas Procedure:   #1:  Re-do sternotomy   #2:  Aortic arch debranching with circulatory arrest: aorto to Innominate (10mm), aorto to left carotid artery (8mm), Aorto to left subclavian bypass (8mm)   #3:  TEVAR   #4:  Proximal extension x 2   #5:  IVUS of the aortic arch and descending thoracic aorta, and upper abdominal aorta, including visceral vessels (celiac, SMA, bilateral renal arteries)   #6:  Thoracic and abdominal aortogram Anesthesia:  General Blood Loss:  See anesthesia record Specimens:  aorta  Indications:  The patient has previously undergone emergent repair of an acute aortic dissection.  He has developed progressive enlargement of his aorta, and is in need of repair  Devices used:  Distal piece is a GORE CTAG 34x10, then a GOE CTAG 37x15, followed by a GORE CTAG 40x15  Procedure:  The patient was identified in the holding area and taken to [facility] OR ROOM 16  The patient was then placed supine on the table. general anesthesia was administered.  The patient was prepped and draped in the usual sterile fashion.  A time out was called and antibiotics were administered.  Please see Dr. Dennie Maizes note for the details of the sternotomy, canulation for circulatory arrest and bypass, as well as aortic debranching.    Once aortic debranching had been completed, the side arm to the aortic graft was canulated, and a 53F Dry seal sheath was inserted.  A Berenstein catheter was used to navigate a benson wire into the abdominal aorta.  A lunderquist wire was then placed to the aortic bufurcation.  IVUS was then used to evaluate the wire placement, confirming that it remained in the true lumen.  We used IVUS to locate the visceral vessels.  Next, The 53F  sheath and dilator were advanced into the descendig thoracic aorta.  A second wire was placed in to the sheath and a omni flush catheter was placed ito the descending thoracic aorta.  A aortogram was performed which identified the celiac, SMA and bilateral renal arteries.  The first device was then prepared and inserted.  This was a GORE CTAG 34x10 device.  It was deployed a few cm proximal to the celiac artery.  A second device was then inserted and deployed with 5cm of overlap.  This was a GORE 37x15 device.  The most proximal device was then deployed, landing at the level of the left subclavian artery.  This was a GORE CTAG 40x15 device.  The distal grafts appeared to open appropriately, however there was a narrowing within the proximal devices, and so a Tri-Lobed balloon was used to mold this area.  A completion aortogram was then performed.  This rebealed the debranching grafts to be patent.  Opacification of the false lumen was apparent from a fenestration below the stent grafts.  The repair appeared successful.  The sheath was then removed, and the side arm was closed with a vascular stapler.    Please see Dr. Dennie Maizes operative note for details of coming off bypass and de-canulation as well as sternotomy closure.   Disposition:  To ICU, intubated in guarded condition   V. Durene Cal, M.D. [facility] [place] Office: [phone] Pager:  [phone]

## 2017-01-21 NOTE — Progress Notes (Signed)
Patient ID: Victor [name], male   DOB: 10-20-1955, 61 y.o.   MRN: [id number] EVENING ROUNDS NOTE :     [address]       [name] [address]             [phone]                 Day of Surgery Procedure(s) (LRB): AORTIC ARCH DEBRANCHING (N/A) Replacement of Aortic Arch with circulatory arrest (N/A) TRANSESOPHAGEAL ECHOCARDIOGRAM (TEE) (N/A) THORACIC AORTIC ENDOVASCULAR STENT GRAFT (N/A) REDO STERNOTOMY (N/A)  Total Length of Stay:  LOS: 0 days  BP 110/73 (BP Location: Right Arm)   Pulse 80   Temp (!) 95 F (35 C) (Core (Comment))   Resp 12   Ht  (1.702 m)   Wt 166 lb (75.3 kg)   SpO2 100%   BMI 26.00 kg/m   .Intake/Output      09/28 0701 - 09/29 0700   I.V. (mL/kg) 5185.7 (68.9)   Blood 1981   Total Intake(mL/kg) 7166.7 (95.2)   Urine (mL/kg/hr) 2890 (2.4)   Blood 1500   Chest Tube 20   Total Output 4410   Net +2756.7         . sodium chloride    . [START ON 01/22/2017] sodium chloride    . sodium chloride 20 mL/hr at 01/21/17 2145  . albumin human    . cefUROXime (ZINACEF)  IV 1.5 g (01/21/17 2234)  . dexmedetomidine 0.7 mcg/kg/hr (01/21/17 2230)  . famotidine (PEPCID) IV 20 mg (01/21/17 2235)  . insulin (NOVOLIN-R) infusion Stopped (01/21/17 2145)  . lactated ringers    . lactated ringers 20 mL/hr at 01/21/17 2145  . lactated ringers 20 mL/hr at 01/21/17 2145  . magnesium sulfate 4 g (01/21/17 2232)  . nitroGLYCERIN Stopped (01/21/17 2200)  . norepinephrine (LEVOPHED) Adult infusion    . phenylephrine (NEO-SYNEPHRINE) Adult infusion Stopped (01/21/17 2145)  . potassium chloride    . [START ON 01/22/2017] vancomycin    . vasopressin (PITRESSIN) infusion - *FOR SHOCK*       Lab Results  Component Value Date   WBC 13.3 (H) 01/21/2017   HGB 11.3 (L) 01/21/2017   HCT 34.2 (L) 01/21/2017   PLT 110 (L) 01/21/2017   GLUCOSE 86 01/19/2017   CHOL 121 12/23/2016   TRIG 57 12/23/2016   HDL 43 12/23/2016   LDLCALC 67 12/23/2016   ALT 16 (L) 01/19/2017   AST 17 01/19/2017   NA 137 01/19/2017   K 4.2 01/19/2017   CL 106 01/19/2017   CREATININE 1.11 01/19/2017   BUN 17 01/19/2017   CO2 21 (L) 01/19/2017   INR 1.09 01/21/2017   HGBA1C 5.8 (H) 01/19/2017   Early post op Not bleeding  Palpable pedal pulses  Sedated on vent   [name] MD  Beeper [phone] Office [phone] 01/21/2017 10:54 PM

## 2017-01-21 NOTE — H&P (Signed)
[facility]  Patient name: Victor [name]          MRN: [id number]        DOB: [birth year]          Sex: male   REQUESTING PROVIDER:    Dr. Tyrone Sage   REASON FOR CONSULT:    Thoracic aortic aneurysm  HISTORY OF PRESENT ILLNESS:   Victor [name] is a 61 y.o. male, who is Referred today for a thoracic aortic aneurysm.  The patient initially presented in June 2015 with a type I aortic dissection.  The patient also presented with an ischemic right leg.  He underwent repair of descending aortic dissection with replacement of the ascending aorta as well as resuspension of aortic valve.  A femoral-femoral bypass graft was also performed.  He has been followed for progressive enlargement of his thoracic aorta, which now measures 5.6 cm.  The patient has a large indirect inguinal hernia which she has not had repaired.  He is also been found to have a renal mass.  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  He denies any claudication symptoms.  He denies any neurologic symptoms.   PAST MEDICAL HISTORY        Past Medical History:  Diagnosis Date  . Thoracic aortic aneurysm (HCC) 09/2014     FAMILY HISTORY        Family History  Problem Relation Age of Onset  . Heart murmur Mother     SOCIAL HISTORY:   Social History        Social History  . Marital status: Single    Spouse name: N/A  . Number of children: N/A  . Years of education: N/A      Occupational History  . Not on file.        Social History Main Topics  . Smoking status: Former Smoker    Years: 35.00    Types: Cigarettes, Cigars    Quit date: 10/07/2014  . Smokeless tobacco: Never Used  . Alcohol use No  . Drug use: No  . Sexual activity: Not on file       Other Topics Concern  . Not on file      Social History Narrative  . No narrative on file    ALLERGIES:    No Known  Allergies  CURRENT MEDICATIONS:          Current Outpatient Prescriptions  Medication Sig Dispense Refill  . aspirin EC 325 MG EC tablet Take 1 tablet (325 mg total) by mouth daily.    . metoprolol tartrate (LOPRESSOR) 25 MG tablet take 1 tablet by mouth twice a day 180 tablet 0  . oxyCODONE-acetaminophen (PERCOCET) 10-325 MG tablet Take 1 tablet by mouth every 8 (eight) hours as needed for pain.     No current facility-administered medications for this visit.     REVIEW OF SYSTEMS:    denotes positive finding,  denotes negative finding Cardiac  Comments:  Chest pain or chest pressure:    Shortness of breath upon exertion:    Short of breath when lying flat:    Irregular heart rhythm:        Vascular    Pain in calf, thigh, or hip brought on by ambulation:    Pain in feet at night that wakes you up from your sleep:     Blood clot in your veins:  Leg swelling:         Pulmonary    Oxygen at home:    Productive cough:     Wheezing:         Neurologic    Sudden weakness in arms or legs:     Sudden numbness in arms or legs:     Sudden onset of difficulty speaking or slurred speech:    Temporary loss of vision in one eye:     Problems with dizziness:         Gastrointestinal    Blood in stool:      Vomited blood:         Genitourinary    Burning when urinating:     Blood in urine:        Psychiatric    Major depression:         Hematologic    Bleeding problems:    Problems with blood clotting too easily:        Skin    Rashes or ulcers:        Constitutional    Fever or chills:     PHYSICAL EXAM:   There were no vitals filed for this visit.  GENERAL: The patient is a well-nourished male, in no acute distress. The vital signs are documented above. CARDIAC: There is a regular rate and rhythm.  VASCULAR: Palpable pedal and radial pulses  bilaterally PULMONARY: Nonlabored respirations ABDOMEN:   Large indirect inguinal hernia with the inability to reduce. MUSCULOSKELETAL: There are no major deformities or cyanosis. NEUROLOGIC: No focal weakness or paresthesias are detected. SKIN: There are no ulcers or rashes noted. PSYCHIATRIC: The patient has a normal affect.  STUDIES:   I have reviewed his CTA with the following findings: 1. Slight interval remodeling of the distal extent of the abdominal aortic dissection. Previously, the dissection flap extended more inferiorly as a thrombosed false lumen/intramural hematoma. The intramural hematoma component has resorbed over time and there is less overall dilatation of the distal abdominal aorta in the region of the inferior mesenteric artery. Otherwise, stable dissection flap and ectasia of the super renal and juxtarenal abdominal aorta. No progression of aneurysmal dilatation or propagation of the dissection flap. 2. Enlarging intermediate attenuation lesion within the lower pole of the right kidney. This remains incompletely evaluated having not been included on prior noncontrast CT images. While it is possible that this represents a slowly enlarging complex cyst, a low grade renal neoplasm such as a papillary cell renal carcinoma is also a distinct possibility. Recommend further evaluation with renal protocol MRI of the abdomen with gadolinium contrast. 3. Persistent differential enhancement of the kidneys with relatively decreased enhancement of the right kidney (the right renal artery arises from the false lumen). No evidence of renal cortical atrophy. 4. Stable appearance of large left inguinal hernia containing multiple loops of small bowel and associated mesentery. 5. Additional ancillary findings as above without significant interval change.  ASSESSMENT and PLAN  Thoracic aortic aneurysm following type I dissection: I discussed with the patient proceeding with  aortic arch reconstruction and stent graft placement to treat his thoracic aneurysm.  We discussed the risk of major complication including the risk of paralysis, death, cardiopulmonary complications, renal failure, intestinal ischemia, and lower extremity arterial complications..  We also discussed the possibility of future intervention should the false lumen not thrombosed.  He understands this and wants to proceed.  I will coordinate this with Dr. Tyrone Sage.  I am trying to get carotid  Doppler studies prior to his operation.  In addition we will see assistance from financial counseling regarding disability versus Medicaid.   Durene Cal, MD [facility] [phone] Pager [phone]   No changes.  No CP or SOB.  Cardiac cath clean  CV:RRR PULM:CTA EXB:MWUX  Plan for arch debranching and stent graft.  All questions answered.  Durene Cal

## 2017-01-21 NOTE — Interval H&P Note (Signed)
History and Physical Interval Note:  01/21/2017 7:19 AM  Victor [name]  has presented today for surgery, with the diagnosis of TAA  The various methods of treatment have been discussed with the patient and family. After consideration of risks, benefits and other options for treatment, the patient has consented to  Procedure(s): AORTIC ARCH DEBRANCHING (N/A) Replacement of Aortic Arch with circ arrest (N/A) TRANSESOPHAGEAL ECHOCARDIOGRAM (TEE) (N/A) THORACIC AORTIC ENDOVASCULAR STENT GRAFT (N/A) as a surgical intervention .  The patient's history has been reviewed, patient examined, no change in status, stable for surgery.  I have reviewed the patient's chart and labs.  Questions were answered to the patient's satisfaction.     [name]

## 2017-01-21 NOTE — Transfer of Care (Signed)
Immediate Anesthesia Transfer of Care Note  Patient: Victor [name]  Procedure(s) Performed: Procedure(s): AORTIC ARCH DEBRANCHING (N/A) Replacement of Aortic Arch with circulatory arrest (N/A) TRANSESOPHAGEAL ECHOCARDIOGRAM (TEE) (N/A) THORACIC AORTIC ENDOVASCULAR STENT GRAFT (N/A) REDO STERNOTOMY (N/A)  Patient Location: SICU  Anesthesia Type:General  Level of Consciousness: Patient remains intubated per anesthesia plan  Airway & Oxygen Therapy: Patient remains intubated per anesthesia plan  Post-op Assessment: Report given to RN and Post -op Vital signs reviewed and stable  Post vital signs: Reviewed and stable  Last Vitals:  Vitals:   01/21/17 0619  BP: (!) 149/95  Pulse: 84  Resp: 18  Temp: 36.7 C  SpO2: 100%    Last Pain:  Vitals:   01/21/17 0619  TempSrc: Oral         Complications: No apparent anesthesia complications

## 2017-01-21 NOTE — Progress Notes (Signed)
    [address]       [place] [address]             [phone]                    [facility] Medical Record #[id number] Date of Birth: 06/25/1955 Referring: Hilty, Kenneth C, MD Primary Care: [name], Wayland, MD  Chief Complaint:    Aortic Arch Aneurysm   10/06/2014 OPERATIVE REPORT PREOPERATIVE DIAGNOSIS: Acute type 1 aortic dissection originating just at the takeoff of the right coronary ostium extending into the right external iliac artery. 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 SURGICAL PROCEDURE: Repair of ascending aortic dissection with replacement of ascending aorta and resuspension of the aortic valve with cardiopulmonary bypass and hypothermic circulatory arrest. SURGEON: Masin Shatto, MD  History of Present Illness:    Victor [name] 61 y.o. 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 Since surgery the patient has stopped smoking.   Since last seen he has consulted with vascular surgery about a combined the branching of the aortic arch and placement of thoracic aortic stent graft for enlarging distal arch aorta.  Cardiac cath has been done without significant coronary artery disease.  Current Activity/ Functional Status:  Patient is independent with mobility/ambulation, transfers, ADL's, IADL's.   Zubrod Score: At the time of surgery this patient's most appropriate activity status/level should be described as: []    0    Normal activity, no symptoms [x]    1    Restricted in physical strenuous activity but ambulatory, able to do out light work []    2    Ambulatory and capable of self care, unable to do work activities, up and  about               >50 % of waking hours                              []    3    Only limited self care, in bed greater than 50% of waking hours []    4    Completely disabled, no self care, confined to bed or chair []    5    Moribund   Past Medical History:  Diagnosis Date  . History of kidney stones   . Hypertension   . Thoracic aortic aneurysm (HCC) 09/2014    Past Surgical History:  Procedure Laterality Date  . CARDIAC CATHETERIZATION    . FEMORAL-FEMORAL BYPASS GRAFT N/A 10/07/2014   Procedure: LEFT  FEMORAL ARTERY -RIGHT FEMORAL ARTERY BYPASS GRAFT USING 8MM X 30 CM HEMASHIELD GOLD GRAFT;  Surgeon: Todd F Early, MD;  Location: [facility] OR;  Service: Vascular;  Laterality: N/A;  . RIGHT/LEFT HEART CATH AND CORONARY ANGIOGRAPHY N/A 01/11/2017   Procedure: RIGHT/LEFT HEART CATH AND CORONARY ANGIOGRAPHY- Diagnostic Only;  Surgeon: Cooper, Michael, MD;  Location: [facility] INVASIVE [facility];  Service: Cardiovascular;  Laterality: N/A;  . THORACIC AORTIC ANEURYSM REPAIR N/A 10/06/2014   Procedure: THORACIC ASCENDING ANEURYSM REPAIR (AAA);  Surgeon: Marykatherine Sherwood B Jumar Greenstreet, MD;  Location: [facility] OR;  Service: Open Heart Surgery;  Laterality: N/A;    hyportermia circulatory arrest and resuspension of aortic valve    Family History  Problem Relation Age of Onset  . Heart murmur Mother     Social History   Social History  . Marital status: Single    Spouse name: N/A  . Number of children: N/A  . Years of education: N/A   Occupational History  . Not on file.   Social History Main Topics  . Smoking status: Former Smoker    Years: 35.00    Types: Cigarettes, Cigars    Quit date: 10/07/2014  . Smokeless tobacco: Never Used  . Alcohol use No  . Drug use: No  . Sexual activity: Not on file   Other Topics Concern  . Not on file   Social History Narrative  . No narrative on file    History  Smoking Status  . Former Smoker  . Years: 35.00  . Types: Cigarettes, Cigars  . Quit date: 10/07/2014    Smokeless Tobacco  . Never Used    History  Alcohol Use No     No Known Allergies  Current Facility-Administered Medications  Medication Dose Route Frequency Provider Last Rate Last Dose  . 0.9 %  sodium chloride infusion   Intravenous Continuous Brabham, Vance W, MD      . cefUROXime (ZINACEF) 1.5 g in dextrose 5 % 50 mL IVPB  1.5 g Intravenous To OR Anyela Napierkowski B, MD      . cefUROXime (ZINACEF) 1.5 g in dextrose 5 % 50 mL IVPB  1.5 g Intravenous 30 min Pre-Op Ltanya Bayley B, MD      . cefUROXime (ZINACEF) 750 mg in dextrose 5 % 50 mL IVPB  750 mg Intravenous To OR Torianne Laflam B, MD      . chlorhexidine (HIBICLENS) 4 % liquid 2 application  30 mL Topical UD Kennisha Qin B, MD      . chlorhexidine (HIBICLENS) 4 % liquid 4 application  60 mL Topical Once Brabham, Vance W, MD       And  . [START ON 01/22/2017] chlorhexidine (HIBICLENS) 4 % liquid 4 application  60 mL Topical Once Brabham, Vance W, MD      . dexmedetomidine (PRECEDEX) 400 MCG/100ML (4 mcg/mL) infusion  0.1-0.7 mcg/kg/hr Intravenous To OR Amberley Hamler B, MD      . DOPamine (INTROPIN) 800 mg in dextrose 5 % 250 mL (3.2 mg/mL) infusion  0-10 mcg/kg/min Intravenous To OR Lekeshia Kram B, MD      . EPINEPHrine (ADRENALIN) 4 mg in dextrose 5 % 250 mL (0.016 mg/mL) infusion  0-10 mcg/min Intravenous To OR Yoan Sallade B, MD      . heparin 2,500 Units, papaverine 30 mg in electrolyte-148 (PLASMALYTE-148) 500 mL irrigation   Irrigation To OR Jostin Rue B, MD      . heparin 30,000 units/NS 1000 mL solution for CELLSAVER   Other To OR Pier Bosher B, MD      . insulin regular (NOVOLIN R,HUMULIN R) 100 Units in sodium chloride 0.9 % 100 mL (1 Units/mL) infusion   Intravenous To OR Arminda Foglio B, MD      . magnesium sulfate (IV Push/IM) injection 40 mEq  40 mEq Other To OR Neenah Canter B, MD      . nitroGLYCERIN 50 mg in dextrose 5 % 250 mL (0.2 mg/mL) infusion  2-200 mcg/min Intravenous To  OR Jordyne Poehlman B, MD      . phenylephrine (NEO-SYNEPHRINE) 20 mg in sodium chloride   0.9 % 250 mL (0.08 mg/mL) infusion  30-200 mcg/min Intravenous To OR Zariana Strub B, MD      . potassium chloride injection 80 mEq  80 mEq Other To OR Asiel Chrostowski B, MD      . tranexamic acid (CYKLOKAPRON) 2,500 mg in sodium chloride 0.9 % 250 mL (10 mg/mL) infusion  1.5 mg/kg/hr Intravenous To OR Hillary Schwegler B, MD      . tranexamic acid (CYKLOKAPRON) bolus via infusion - over 30 minutes 1,116 mg  15 mg/kg Intravenous To OR Louden Houseworth B, MD      . tranexamic acid (CYKLOKAPRON) pump prime solution 149 mg  2 mg/kg Intracatheter To OR Ica Daye B, MD      . vancomycin (VANCOCIN) 1,250 mg in sodium chloride 0.9 % 250 mL IVPB  1,250 mg Intravenous To OR Yazaira Speas B, MD       Facility-Administered Medications Ordered in Other Encounters  Medication Dose Route Frequency Provider Last Rate Last Dose  . fentaNYL (SUBLIMAZE) injection   Intravenous Anesthesia Intra-op Flowers, Rokoshi T, CRNA   50 mcg at 01/21/17 0654  . midazolam (VERSED) 5 MG/5ML injection    Anesthesia Intra-op Flowers, Rokoshi T, CRNA   2 mg at 01/21/17 0649   Prescriptions Prior to Admission  Medication Sig Dispense Refill Last Dose  . aspirin EC 325 MG EC tablet Take 1 tablet (325 mg total) by mouth daily.   01/20/2017 at Unknown time  . metoprolol tartrate (LOPRESSOR) 25 MG tablet take 1 tablet by mouth twice a day 180 tablet 0 01/20/2017 at Unknown time  . oxyCODONE-acetaminophen (PERCOCET) 10-325 MG tablet Take 1 tablet by mouth every 8 (eight) hours as needed for pain.   01/20/2017 at Unknown time     Review of Systems:     Cardiac Review of Systems: Y or N  Chest Pain [  n  ]  Resting SOB [  n ] Exertional SOB  [n Orthopnea [  n]   Pedal Edema [n   ]    Palpitations [n  ] Syncope  [ n ]   Presyncope [ n  ]  General Review of Systems: [Y] = yes [  ]=no Constitional: recent weight change [  ];  Wt loss  over the last 3 months [   ] anorexia [  ]; fatigue [n]; nausea [  ]; night sweats [  ]; fever [  ]; or chills [  ];          Dental: poor dentition[  ]; Last Dentist visit:   Eye : blurred vision [n  ]; diplopia [n   ]; vision changes [ n ];  Amaurosis fugax[n  ]; Resp: cough [  ];  wheezing[  ];  hemoptysis[  ]; shortness of breath[  ]; paroxysmal nocturnal dyspnea[ n ]; dyspnea on exertion[ n ]; or orthopnea[  ];  GI:  gallstones[  ], vomiting[  ];  dysphagia[  ]; melena[  ];  hematochezia [  ]; heartburn[  ];   Hx of  Colonoscopy[  ]; GU: kidney stones [  ]; hematuria[ n ];   dysuria [ n ];  nocturia[  ];  history of     obstruction [  ]; urinary frequency [  ]             Skin: rash, swelling[  ];, hair loss[  ];  peripheral edema[  ];  or itching[  ]; Musculosketetal: myalgias[ n ];  joint   swelling[ n ];  joint erythema[n  ];  joint pain[  ];  back pain[n  ];  Heme/Lymph: bruising[  ];  bleeding[  ];  anemia[  ];  Neuro: TIA[  ];  headaches[  ];  stroke[  ];  vertigo[  ];  seizures[  ];   paresthesias[n  ];  difficulty walking[ n ];  Psych:depression[  ]; anxiety[  ];  Endocrine: diabetes[  ];  thyroid dysfunction[  ];  Immunizations: Flu up to date [ n ]; Pneumococcal up to date [n  ];  Other:  Physical Exam: BP (!) 149/95   Pulse 84   Temp 98 F (36.7 C) (Oral)   Resp 18   Ht 5' 7" (1.702 m)   Wt 166 lb (75.3 kg)   SpO2 100%   BMI 26.00 kg/m   PHYSICAL EXAMINATION: Physical Exam  Constitutional: He is oriented to person, place, and time. He appears well-nourished. No distress.  HENT:  Mouth/Throat: No oropharyngeal exudate.  Eyes: Left eye exhibits no discharge. No scleral icterus.  Neck: No JVD present. No tracheal deviation present. No thyromegaly present.  Cardiovascular: Normal rate, regular rhythm, normal heart sounds and intact distal pulses.  Exam reveals no gallop and no friction rub.   No murmur heard. Respiratory: Effort normal and breath sounds normal. No  stridor. No respiratory distress. He has no wheezes. He has no rales. He exhibits no tenderness.  GI: Soft. Bowel sounds are normal. He exhibits no distension. There is no tenderness. There is no rebound and no guarding.  Musculoskeletal: He exhibits no edema, tenderness or deformity.  Lymphadenopathy:    He has no cervical adenopathy.  Neurological: He is alert and oriented to person, place, and time.  Skin: Skin is warm and dry. He is not diaphoretic.  Psychiatric: He has a normal mood and affect. His behavior is normal. Judgment and thought content normal.  Large left inguinal hernia, palpable 1+ DP and PT pulses at the right ankle 2+ DP and PT pulses at the left ankle, he has bilateral femoral pulses but no palpable pulse in the femorofemoral crossover graft Incision in the right infraclavicular area from previous right axillary artery cannulation   Diagnostic Studies & Laboratory data:     Recent Radiology Findings:  Mr Abdomen Wwo Contrast  Result Date: 01/06/2017 CLINICAL DATA:  Renal lesion on recent CT scan. EXAM: MRI ABDOMEN WITHOUT AND WITH CONTRAST TECHNIQUE: Multiplanar multisequence MR imaging of the abdomen was performed both before and after the administration of intravenous contrast. CONTRAST:  15mL MULTIHANCE GADOBENATE DIMEGLUMINE 529 MG/ML IV SOLN COMPARISON:  CTA from 12/09/2016 FINDINGS: Lower chest:  No evidence for pleural effusion. Hepatobiliary: Liver unremarkable although incompletely evaluated on this dedicated renal study. There is no evidence for gallstones, gallbladder wall thickening, or pericholecystic fluid. No intrahepatic or extrahepatic biliary dilation. Pancreas: No focal mass lesion. No dilatation of the main duct. No intraparenchymal cyst. No peripancreatic edema. Spleen: Visualized portions of the spleen are unremarkable. Adrenals/Urinary Tract: No adrenal nodule or mass. Right renal lesion of concern on prior CTA is identified towards the lower pole the  right kidney. This 2.0 x 1.8 cm T1 hyperintense, T2 variable intensity, partially septated lesion shows layering debris or hemorrhage. No substantial enhancement within the lesion although the most delayed postcontrast subtraction imaging suggests possible small enhancing nodules (image 34 series 19 and image 37 series 19). Scattered small simple cysts are identified in the left kidney. No enhancing left renal mass. Stomach/Bowel: Stomach is   nondistended. No gastric wall thickening. No evidence of outlet obstruction. Duodenum is normally positioned as is the ligament of Treitz. No small bowel or colonic dilatation within the visualized abdomen. Vascular/Lymphatic: Thoracolumbar aortic dissection better characterized on the recent CTA exam. Other: No intraperitoneal free fluid. Musculoskeletal: No abnormal marrow signal within the visualized bony anatomy. IMPRESSION: 1. 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 2. Small simple cysts (Bosniak I) in the left kidney. 3. Thoracoabdominal aortic dissection as better characterized on recent CTA. Electronically Signed   By: Eric  Mansell M.D.   On: 01/06/2017 08:44  reviwed with Dr Otlin urology, follow scans recommended   Result Date: 12/09/2016 CLINICAL DATA:  61-year-old male with chronic aortic dissection. EXAM: CT ANGIOGRAPHY CHEST, ABDOMEN AND PELVIS TECHNIQUE: Multidetector CT imaging through the chest, abdomen and pelvis was performed using the standard protocol during bolus administration of intravenous contrast. Multiplanar reconstructed images and MIPs were obtained and reviewed to evaluate the vascular anatomy. Creatinine was obtained on site at [facility] at [address] Results: Creatinine 1.2 mg/dL. CONTRAST:  75 mL Isovue 370 COMPARISON:  Most recent prior chest CTA 05/27/2016 ; most recent prior CT scan of the chest, abdomen and pelvis 04/10/2015 FINDINGS: CTA CHEST FINDINGS Cardiovascular:  Surgical changes of prior operative repair of the ascending portion of the known Stanford a thoracic aortic aneurysm. The tube graft remains unchanged in appearance. The dissection flap begins immediately beyond the distal anastomosis and extends throughout the thoracic aorta and into the abdominal aorta. Progressive aneurysmal dilatation of the aortic infundibulum now measuring 5.6 cm compared to 5.3 cm previously (confirmed on coronal reformatted images). The aneurysmal descending thoracic aorta remains unchanged in caliber measuring 5.5 cm at the level of the origin of the main pulmonary artery and 4.5 cm just proximal to the aortic hiatus. The arch vessels remain tortuous. The dissection flap does not extend into the vessels. Main pulmonary artery remains normal in size. The heart remains normal in size. No pericardial effusion. Mediastinum/Nodes: Similar appearance of heterogeneous multinodular thyroid gland. Mildly prominent right para-aortic lymph nodes again noted without significant change compared to 2016. These are likely reactive. No new adenopathy or mediastinal mass. Unremarkable thoracic esophagus. Lungs/Pleura: Paraseptal pulmonary emphysema with bullous change at the right lung apex. No evidence of pleural effusion, focal airspace consolidation, pulmonary edema or pneumothorax. Respiratory motion artifact noted in the lower lungs bilaterally. No suspicious nodule or mass. Musculoskeletal: No acute fracture or aggressive appearing lytic or blastic osseous lesion. Healed median sternotomy. Review of the MIP images confirms the above findings. CTA ABDOMEN AND PELVIS FINDINGS VASCULAR Aorta: Chronic dissection of the the abdominal aorta again noted. The dissection flap extends into the infrarenal abdominal aorta and terminates proximal to the inferior mesenteric artery. There has been some remodeling of the intramural hematoma in the distal aorta compared to December of 2016. The visceral aorta remains  mildly aneurysmal at 4.7 cm which is unchanged compared to prior. The remainder of the aorta remains within normal limits in size. Celiac: Fenestration of the dissection flap at the origin of the celiac artery. Thus, the celiac artery arises from both the true and false lumen. Widely patent. No aneurysm. SMA: Arises from the true lumen. Widely patent. No aneurysm or dissection. Renals: Fenestration at the level of the renal arteries providing cross flow. The right renal artery arises from the false lumen while the left renal artery arises from the true lumen. The renal arteries   are normal in caliber. No evidence of dissection or fibromuscular dysplasia. IMA: Patent and unremarkable. Inflow: Relatively spared from disease.  Widely patent. Outflow: Ectasia of the bilateral common femoral artery is likely at sites of prior endarterectomy. There is a completely excluded fem-fem bypass graft. The visualized portions of the superficial and profunda femoral arteries are widely patent. Veins: No obvious venous abnormality within the limitations of this arterial phase study. Review of the MIP images confirms the above findings. NON-VASCULAR Hepatobiliary: Normal hepatic contour and morphology. No discrete hepatic lesions. Normal appearance of the gallbladder. No intra or extrahepatic biliary ductal dilatation. Pancreas: Unremarkable. No pancreatic ductal dilatation or surrounding inflammatory changes. Spleen: Normal in size without focal abnormality. Adrenals/Urinary Tract: Normal adrenal glands. Differential enhancement. There is relatively decreased enhancement of the right renal parenchyma. No evidence of hydronephrosis or nephrolithiasis. 2.2 cm intermediate attenuation in the lower pole of the right kidney has slowly grown in size compared to the initial CT scan from June of 2016 at which time it measured 1.6 cm. Small low-attenuation cyst exophytic from the upper pole of the left kidney remains unchanged and is likely  a simple cyst. Unremarkable ureters and bladder. Stomach/Bowel: No focal bowel wall thickening or evidence of obstruction. Large left inguinal hernia containing multiple loops of small bowel and mesenteric. Normal appendix in the right lower quadrant. Lymphatic: No suspicious lymphadenopathy. Reproductive: Prostate is unremarkable. Other: Large left inguinal hernia.  Negative for ascites. Musculoskeletal: No acute fracture or aggressive appearing lytic or blastic osseous lesion. Right-sided facet arthropathy at L5-S1. Review of the MIP images confirms the above findings. IMPRESSION: CTA CHEST 1. Enlarging chronic aneurysmal thoracic aortic dissection. The aortic infundibulum now measures 5.6 cm compared to 5.3 cm on 05/27/2016 and 4.1 cm at the time of presentation in June of 2016. No evidence of propagation of the dissection flap. 2. Prior surgical repair of Stanford type A ascending thoracic aortic aneurysm with stable appearance of the ascending aortic tube graft. 3. Stable chronic aneurysmal dissection of the descending thoracic aorta. 4. Paraseptal emphysema with bullous change in the right lung apex. CTA ABD/PELVIS 1. Slight interval remodeling of the distal extent of the abdominal aortic dissection. Previously, the dissection flap extended more inferiorly as a thrombosed false lumen/intramural hematoma. The intramural hematoma component has resorbed over time and there is less overall dilatation of the distal abdominal aorta in the region of the inferior mesenteric artery. Otherwise, stable dissection flap and ectasia of the super renal and juxtarenal abdominal aorta. No progression of aneurysmal dilatation or propagation of the dissection flap. 2. Enlarging intermediate attenuation lesion within the lower pole of the right kidney. This remains incompletely evaluated having not been included on prior noncontrast CT images. While it is possible that this represents a slowly enlarging complex cyst, a low grade  renal neoplasm such as a papillary cell renal carcinoma is also a distinct possibility. Recommend further evaluation with renal protocol MRI of the abdomen with gadolinium contrast. 3. Persistent differential enhancement of the kidneys with relatively decreased enhancement of the right kidney (the right renal artery arises from the false lumen). No evidence of renal cortical atrophy. 4. Stable appearance of large left inguinal hernia containing multiple loops of small bowel and associated mesentery. 5. Additional ancillary findings as above without significant interval change. These results were called by telephone at the time of interpretation on 12/09/2016 at 12:14 pm to Dr. Venissa Nappi , who verbally acknowledged these results. Signed, Heath K. McCullough, MD [facility] Electronically Signed   By: Heath  McCullough M.D.   On: 12/09/2016 12:14   I have independently reviewed the above radiology studies  and reviewed the findings with the patient.   Ct Angio Chest Aorta W/cm &/or Wo/cm  Result Date: 05/27/2016 CLINICAL DATA:  60-year-old male with history of thoracic aortic dissection status post surgical repair in February 2016. Followup study. EXAM: CT ANGIOGRAPHY CHEST WITH CONTRAST TECHNIQUE: Multidetector CT imaging of the chest was performed using the standard protocol during bolus administration of intravenous contrast. Multiplanar CT image reconstructions and MIPs were obtained to evaluate the vascular anatomy. CONTRAST:  100 mL of Isovue 370. COMPARISON:  Chest CT 08/28/2015. FINDINGS: Cardiovascular: Heart size is normal. There is no significant pericardial fluid, thickening or pericardial calcification. There is aortic atherosclerosis, as well as atherosclerosis of the great vessels of the mediastinum and the coronary arteries, including calcified atherosclerotic plaque in the left anterior descending and right coronary arteries. Status  post median sternotomy for graft replacement of the ascending thoracic aorta. The proximal and distal anastomoses of the graft are intact. No abnormal fluid collection around the graft. Immediately distal to the distal anastomosis there is an aortic dissection, which extends into the abdominal aorta (below the lower margin of the images). The dissection flap does not extend into the great vessels. The dissection flap over rides the origin of the celiac axis which appears to be fed by the false lumen. Aneurysmal dilatation of the distal aortic arch which measures up to 5.3 cm in diameter. There is also aneurysmal dilatation of the descending thoracic aorta which measures up to 4.2 x 4.7 cm (mean diameter of 4.5 cm). On the precontrast images there is no crescentic high attenuation associated with the wall of the thoracic aorta to suggest acute intramural hemorrhage. Mediastinum/Nodes: Multiple borderline enlarged and mildly enlarged mediastinal lymph nodes measuring up to 1 cm in short axis in the right paratracheal nodal stations, similar to the prior study. Esophagus is unremarkable in appearance. No axillary lymphadenopathy. Lungs/Pleura: New 3 x 5 mm (mean diameter 4 mm) pulmonary nodule in the right middle lobe (image 103 of series 14). No other larger more suspicious appearing pulmonary nodules or masses are noted. Scattered areas of mild linear scarring are noted throughout the lung bases. No acute consolidative airspace disease. No pleural effusions. Moderate paraseptal emphysema most notable in the lung apices. Upper Abdomen: Unremarkable. Musculoskeletal: Median sternotomy wires. There are no aggressive appearing lytic or blastic lesions noted in the visualized portions of the skeleton. Review of the MIP images confirms the above findings. IMPRESSION: 1. Chronic type A thoracic aortic dissection status post graft repair of the ascending thoracic aorta, with persistent aneurysmal dilatation of the distal  aortic arch (5.3 cm in diameter) and the descending thoracic aorta (mean diameter 4.5 cm), as above. No acute findings. 2. 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 3. Moderate paraseptal emphysema. 4.  2 vessel coronary artery disease. Electronically Signed   By: Daniel  Entrikin M.D.   On: 05/27/2016 12:21   Ct Angio Chest Aorta W/cm &/or Wo/cm  08/28/2015  CLINICAL DATA:  Followup thoracic aneurysm repair EXAM: CT ANGIOGRAPHY CHEST WITH CONTRAST TECHNIQUE: Multidetector CT imaging of the chest was performed using the standard protocol during bolus administration of intravenous contrast. Multiplanar CT image reconstructions and MIPs were obtained to evaluate the vascular anatomy. CONTRAST:  75 mL [facility] COMPARISON:  04/10/2015   FINDINGS: Lungs are well aerated bilaterally. Diffuse emphysematous changes are seen. A 6 mm nodule is again noted in the right middle lobe best seen on image number 80 of series 5. Thoracic inlet again demonstrates the thyroid to be somewhat enlarged in size. Previously seen left thyroid nodule is again noted and stable. There again noted changes consistent with ascending aortic repair. Persistent dissection flap is noted in the mid thoracic arch extending into the descending thoracic aorta. This is stable in appearance from the prior exam. The false lumen occupies the majority of the descending thoracic aorta. The brachiocephalic vessels are supplied by the true lumen. As is the celiac axis and superior mesenteric artery. The overall appearance of the aorta is stable from the prior exam. No new focal abnormality is seen. The pulmonary artery as visualized is within normal limits. No hilar or mediastinal adenopathy is noted. The visualized upper abdomen demonstrates decreased perfusion of the right kidney similar to that seen on the prior exam likely related to supplied from the false  lumen. The osseous structures show no acute abnormality. Review of the MIP images confirms the above findings. IMPRESSION: Emphysematous changes. Stable 6 mm nodule in the right middle lobe. Stable changes of ascending aortic repair. There is a persistent dissection flap extending from the aortic arch to the descending aorta. This is stable from the prior exam. No new focal abnormality is noted. Electronically Signed   By: Mark  Lukens M.D.   On: 08/28/2015 08:51    Ct Angio Chest Aorta W/cm &/or Wo/cm  04/10/2015  CLINICAL DATA:  History of dissection abdominal aortic aneurysm. Former smoker. EXAM: CT ANGIOGRAPHY CHEST, ABDOMEN AND PELVIS TECHNIQUE: Multidetector CT imaging through the chest, abdomen and pelvis was performed using the standard protocol during bolus administration of intravenous contrast. Multiplanar reconstructed images and MIPs were obtained and reviewed to evaluate the vascular anatomy. CONTRAST:  75 cc SVC 70 COMPARISON:  CT the chest, abdomen pelvis - 10/06/2014 FINDINGS: CTA CHEST FINDINGS Vascular Findings: The patient has undergone open repair of the ascending thoracic aorta. The bypass graft is widely patent. There is a minimal amount of ill-defined stranding within the anterior mediastinum which is favored to be postoperative. No contrast extravasation. Review of the precontrast images are negative for the presence of an intramural hematoma. There is a persistent dissection involving the cranial most aspect of the ascending thoracic aorta extending through the aortic arch and the descending thoracic aorta to the level of the mid/distal aspect of the abdominal aorta. Both the true and false lumens of the abdominal aortic dissection remain widely patent through the level of the descending thoracic aorta. There has been interval enlargement of the aortic arch and descending thoracic aorta. The aortic arch now measures approximately 48 mm in diameter (previously, 34 mm) while the distal  descending thoracic aorta now measures approximately 37 mm, previously, 32 mm. No evidence of periaortic stranding. Normal heart size. No pericardial effusion. Although this examination was not tailored for the evaluation the pulmonary arteries, there are no discrete filling defects within the central pulmonary arterial tree to suggest central pulmonary embolism. ------------------------------------------------------------- Thoracic aortic measurements: Sinotubular junction 30 mm measured in greatest oblique coronal dimension. Proximal ascending aorta The bypass graft measures approximately 35 mm in greatest oblique short axis diameter as measured in greatest oblique axial dimension at the level of the main pulmonary artery. Aortic arch aorta 48 mm as measured in greatest oblique sagittal dimension, previously 34 mm, an approximately 46 mm in greatest   oblique short axis axial diameter (image 36, series 4), previously, 35 mm. Proximal descending thoracic aorta 41 mm as measured in greatest oblique axial dimension at the level of the main pulmonary artery, previously, 36 mm Distal descending thoracic aorta 37 mm as measured in greatest oblique axial dimension at the level of the diaphragmatic hiatus, previously 32 mm Review of the MIP images confirms the above findings. ------------------------------------------------------------- Non-Vascular Findings: Minimal dependent subpleural ground-glass atelectasis, left greater than right. No discrete focal airspace opacities. No pleural effusion or pneumothorax. There is minimal subsegmental atelectasis along the right minor fissure. Mild apical predominant paraseptal and centrilobular emphysematous change, right greater than left. Punctate (approximately 0.6 cm) noncalcified ground-glass nodule within in the right middle lobe (image 39, series 5), unchanged since the 09/2010/2016 examination. Scattered prominent mediastinal lymph nodes with index right sided precarinal  lymph node measuring 1.1 cm in greatest short axis diameter (image 45, series 4). Additional scattered mediastinal lymph nodes are numerous though individually not enlarged by size criteria with index precarinal lymph node measuring 0.8 cm in greatest short axis diameter, likely reactive in etiology. No hilar axillary lymphadenopathy. Post median sternotomy. No acute or aggressive osseous abnormalities. There is diffuse heterogeneity of the thyroid gland with a suspected punctate (approximately 0.9 cm) hypo attenuating nodule within the left lobe of the thyroid (image 15, series 4). Review of the MIP images confirms the above findings. --------------------------------------------------------------------------------- CTA ABDOMEN AND PELVIS FINDINGS Vascular Findings: Abdominal aorta: The thoracic aortic dissection extends through the mid/ distal aspects of the abdominal aorta. The false lumen of the thoracic aortic dissection is again noted to supply the right renal artery as well as the majority of supplied to the celiac artery. While there is expected slightly delayed enhancement of the right kidney in relation to the left, there is no evidence of end organ ischemic change. The dissection is again noted to abut but not extend into the origin of the celiac artery as well as the SMA. Interval increase in size of the abdominal aorta now measuring 3 cm in diameter (image 147, series 4), previously, 2.3 cm, an approximately 3 cm in greatest oblique coronal short axis diameter (image 69, series 601), previously, 2.4 cm. The dissection terminates at the level just caudal to the take-off of the IMA. The distal abdominal aorta is widely patent as is the previously thrombosed right common and external iliac arteries. Celiac artery: As above, the dissection extends to abut the origin of the celiac artery though not extend into the vessel and not definitely result in hemodynamically significant stenosis. The supply to the  celiac artery is primarily via the false lumen. Conventional branching pattern. SMA: Supplied via the true lumen. Conventional branching pattern. The distal tributaries the SMA are widely patent without discrete intraluminal filling defect to suggest distal embolism. Right Renal artery: Solitary; supplied by the false lumen. Widely patent without hemodynamically significant narrowing. No vessel irregularity to suggest FMD. Left Renal artery: Duplicated ; both right-sided renal arteries are supplied by the true lumen. Both co-dominant left-sided renal arteries are widely patent without hemodynamically significant narrowing. No vessel irregularity to suggest FMD. IMA: Supplied by the true lumen. Widely patent without hemodynamically significant narrowing. Pelvic vasculature: As above, there is no longer extension of the thoracic aortic dissection thrombus to involve the right common and external iliac arteries. These vessels now remain widely patent. The bilateral external and internal iliac arteries are widely patent without hemodynamically significant narrowing. There is a thrombosed fem fem bypass graft   with mild aneurysmal dilatation adjacent to the anastomosis. The bilateral deep and superficial femoral arteries are widely patent throughout their imaged course. Review of the MIP images confirms the above findings. -------------------------------------------------------------------------------- Nonvascular Findings: Evaluation of the abdominal organs is largely limited to the arterial phase of enhancement. Normal hepatic contour. No discrete hyper enhancing hepatic lesions. Normal noncontrast appearance of the gallbladder. No radiopaque gallstones. No intra or extrahepatic biliary duct dilatation. No ascites. There is slightly delayed enhancement of the right kidney in comparison to the left secondary to the right kidney being supplied via the false lumen of the abdominal aortic dissection. No renal stones. No  urinary obstruction or perinephric stranding. Normal appearance of the bilateral adrenal glands, pancreas and spleen. Large left-sided indirect inguinal hernia which is noted to again contain multiple loops of nondilated small bowel, not resulting in enteric obstruction. Moderate to large colonic stool burden. The bowel is otherwise normal in course and caliber without wall thickening. Normal appearance of the terminal ileum and appendix. No pneumoperitoneum, pneumatosis or portal venous gas. No bulky retroperitoneal, mesenteric, pelvic or inguinal lymphadenopathy. Scattered calcifications within normal sized prostate gland. Normal appearance of the urinary bladder given degree distention. No free fluid in the pelvic cul-de-sac. No acute or aggressive osseous abnormalities within the abdomen or pelvis. Incidental note is made of a nondisplaced left-sided L5 pars defects without associated anterolisthesis. Regional soft tissues appear otherwise normal. Review of the MIP images confirms the above findings. IMPRESSION: Vascular Impression of the chest: 1. Post open repair of the ascending thoracic aorta without evidence of complication. 2. Persistent findings of a type A dissection originating immediately adjacent to the distal end of the open bypass graft. 3. Interval enlargement of the aortic arch and descending thoracic aorta (aortic arch now measures 48 mm in diameter, previously, 34 mm). No periaortic stranding. Nonvascular Impression of the chest: 1. No acute cardiopulmonary disease. 2. Moderate centrilobular and paraseptal emphysematous change. 3. Punctate (approximately 0.5 cm) nodule within the right middle lobe is unchanged since the 09/2014 examination. Given risk factors for bronchogenic carcinoma, follow-up chest CT at 6 - 12 months is recommended. This recommendation follows the consensus statement: Guidelines for Management of Small Pulmonary Nodules Detected on CT Scans: A Statement from the Fleischner  Society as published in Radiology 2005;237:395-400. 4. Punctate (approximately 0.9 cm) hypoattenuating nodule with the left lobe of the thyroid. Further evaluation with dedicated thyroid ultrasound could be performed as clinically indicated. ----------------------------------------------------------------------------------- Vascular Impression of the abdomen and pelvis: 1. There is persistent extension of the thoracic aortic dissection now terminating at the level of the mid/distal abdominal aorta. The dissection is again noted to abut the origin of both the celiac and SMA though not extend into either vessel. The right renal artery as well as the majority of the celiac artery are supplied via the false lumen. 2. Interval increase in size of the abdominal aorta now measuring 3 cm in diameter, previously, 2.3 cm. 3. The dissection no longer extends to involve the right common iliac artery. Both the right common and external iliac arteries are now appear widely patent. As such, the fem-fem bypass graft is occluded. Nonvascular Impression of the abdomen and pelvis: 1. Grossly unchanged large left-sided indirect inguinal hernia which is again noted to contain multiple loops of nondilated small bowel. Electronically Signed   By: John  Watts M.D.   On: 04/10/2015 11:51    Recent Lab Findings: Lab Results  Component Value Date   WBC 5.0 01/19/2017     HGB 14.8 01/19/2017   HCT 43.9 01/19/2017   PLT 181 01/19/2017   GLUCOSE 86 01/19/2017   CHOL 121 12/23/2016   TRIG 57 12/23/2016   HDL 43 12/23/2016   LDLCALC 67 12/23/2016   ALT 16 (L) 01/19/2017   AST 17 01/19/2017   NA 137 01/19/2017   K 4.2 01/19/2017   CL 106 01/19/2017   CREATININE 1.11 01/19/2017   BUN 17 01/19/2017   CO2 21 (L) 01/19/2017   INR 1.13 01/19/2017   HGBA1C 5.8 (H) 01/19/2017  Transthoracic Echocardiography  Patient:    Harton, Elbridge E MR #:       [id number] Study Date: 12/23/2016 Gender:     M Age:        61 Height:      170.2 cm Weight:     75.3 kg BSA:        1.9 m^2 Pt. Status: Room:   ATTENDING    Philip Nahser, M.D.  ORDERING     Ovadia Lopp MD  REFERRING    Vaidehi Braddy MD  REFERRING    [name], [place] [id number]  SONOGRAPHER  NaTashia Rodgers, RDCS  PERFORMING   [facility]  cc:  ------------------------------------------------------------------- LV EF: 60% -   65%  ------------------------------------------------------------------- Indications:      I35.1 Aortic Valve Insufficiency.  ------------------------------------------------------------------- History:   PMH:  Acquired from the patient and from the patient&'s chart.  PMH:  Thoracic Aortic Aneurysm.  ------------------------------------------------------------------- Study Conclusions  - Left ventricle: The cavity size was normal. Wall thickness was   increased in a pattern of mild LVH. Systolic function was normal.   The estimated ejection fraction was in the range of 60% to 65%.   Wall motion was normal; there were no regional wall motion   abnormalities. Features are consistent with a pseudonormal left   ventricular filling pattern, with concomitant abnormal relaxation   and increased filling pressure (grade 2 diastolic dysfunction). - Aortic valve: There was trivial regurgitation.  ------------------------------------------------------------------- Study data:   Study status:  Routine.  Procedure:  The patient reported no pain pre or post test. Transthoracic echocardiography for left ventricular function evaluation, for right ventricular function evaluation, and for assessment of valvular function. Image quality was adequate.  Study completion:  There were no complications.          Transthoracic echocardiography.  M-mode, complete 2D, spectral Doppler, and color Doppler.  Birthdate: Patient birthdate: 12/07/1955.  Age:  Patient is 61 yr old.  Sex: Gender: male.    BMI: 26 kg/m^2.  Blood pressure:      140/93 Patient status:  Outpatient.  Study date:  Study date: 12/23/2016. Study time: 09:35 AM.  Location:  [facility]  -------------------------------------------------------------------  ------------------------------------------------------------------- Left ventricle:  The cavity size was normal. Wall thickness was increased in a pattern of mild LVH. Systolic function was normal. The estimated ejection fraction was in the range of 60% to 65%. Wall motion was normal; there were no regional wall motion abnormalities. Features are consistent with a pseudonormal left ventricular filling pattern, with concomitant abnormal relaxation and increased filling pressure (grade 2 diastolic dysfunction).  ------------------------------------------------------------------- Aortic valve:   Structurally normal valve.   Cusp separation was normal.  Doppler:  Transvalvular velocity was within the normal range. There was no stenosis. There was trivial regurgitation.  ------------------------------------------------------------------- Aorta:  Aortic root: The aortic root was normal in size. Ascending aorta: The ascending aorta was normal in size.  ------------------------------------------------------------------- Mitral valve:   Structurally normal valve.     Leaflet separation was normal.  Doppler:  Transvalvular velocity was within the normal range. There was no evidence for stenosis. There was no regurgitation.  ------------------------------------------------------------------- Left atrium:  The atrium was normal in size.  ------------------------------------------------------------------- Right ventricle:  The cavity size was normal. Systolic function was normal.  ------------------------------------------------------------------- Pulmonic valve:    The valve appears to be grossly normal. Doppler:  There was no significant  regurgitation.  ------------------------------------------------------------------- Tricuspid valve:   The valve appears to be grossly normal. Doppler:  There was mild regurgitation.  ------------------------------------------------------------------- Pulmonary artery:   Systolic pressure was within the normal range.   ------------------------------------------------------------------- Right atrium:  The atrium was normal in size.  ------------------------------------------------------------------- Pericardium:  There was no pericardial effusion.  ------------------------------------------------------------------- Systemic veins: Inferior vena cava: The vessel was normal in size. The respirophasic diameter changes were in the normal range (>= 50%), consistent with normal central venous pressure.  ------------------------------------------------------------------- Measurements   Left ventricle                         Value        Reference  LV ID, ED, PLAX chordal        (L)     34.3  mm     43 - 52  LV ID, ES, PLAX chordal                26.1  mm     23 - 38  LV fx shortening, PLAX chordal (L)     24    %      >=29  LV PW thickness, ED                    11.6  mm     ---------  IVS/LV PW ratio, ED                    1.12         <=1.3  Stroke volume, 2D                      55    ml     ---------  Stroke volume/bsa, 2D                  29    ml/m^2 ---------  LV e&', lateral                         10.4  cm/s   ---------  LV E/e&', lateral                       6.79         ---------  LV e&', medial                          9.87  cm/s   ---------  LV E/e&', medial                        7.15         ---------  LV e&', average                         10.14 cm/s   ---------  LV E/e&', average                         6.97         ---------    Ventricular septum                     Value        Reference  IVS thickness, ED                      13    mm     ---------    LVOT                                    Value        Reference  LVOT ID, S                             21    mm     ---------  LVOT area                              3.46  cm^2   ---------  LVOT ID                                21    mm     ---------  LVOT peak velocity, S                  82    cm/s   ---------  LVOT mean velocity, S                  60.7  cm/s   ---------  LVOT VTI, S                            15.8  cm     ---------  LVOT peak gradient, S                  3     mm Hg  ---------  Stroke volume (SV), LVOT DP            54.7  ml     ---------  Stroke index (SV/bsa), LVOT DP         28.8  ml/m^2 ---------    Aorta                                  Value        Reference  Aortic root ID, ED                     40    mm     ---------  Ascending aorta ID, A-P, S             32    mm     ---------    Left atrium                            Value        Reference  LA ID, A-P, ES                           41    mm     ---------  LA ID/bsa, A-P                         2.16  cm/m^2 <=2.2  LA volume, S                           40    ml     ---------  LA volume/bsa, S                       21.1  ml/m^2 ---------  LA volume, ES, 1-p A4C                 47    ml     ---------  LA volume/bsa, ES, 1-p A4C             24.7  ml/m^2 ---------  LA volume, ES, 1-p A2C                 30    ml     ---------  LA volume/bsa, ES, 1-p A2C             15.8  ml/m^2 ---------    Mitral valve                           Value        Reference  Mitral E-wave peak velocity            70.6  cm/s   ---------  Mitral A-wave peak velocity            73.1  cm/s   ---------  Mitral deceleration time               197   ms     150 - 230  Mitral E/A ratio, peak                 1            ---------    Tricuspid valve                        Value        Reference  Tricuspid regurg peak velocity         248   cm/s   ---------  Tricuspid peak RV-RA gradient          25    mm Hg  ---------    Right ventricle                         Value        Reference  RV s&', lateral, S                      15.7  cm/s   ---------  Legend: (L)  and  (H)  mark values outside specified reference range.  ------------------------------------------------------------------- Prepared and Electronically Authenticated by  Philip Nahser, M.D. 2018-08-30T13:38:49  CATH: 01/11/2017  Cooper, Michael, MD (Primary)    Procedures   RIGHT/LEFT HEART CATH AND CORONARY ANGIOGRAPHY- Diagnostic Only  Conclusion   1. Widely patent, angiographically normal, dominant RCA 2. Angiographically normal LAD and LCx 3. Mild narrowing at the ostium of the left main without pressure dampening 4. 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 The fem-femoral bypass is clotted but he has preserved flow to both lowe extremities without symptoms On exam the patient has no evidence of aortic insufficiency 2/ vessel coronary artery disease by ct , clear on recent catheterization 3/ the imaging of the renal cysts have been reviewed by Dr. Otlin , no further evaluation at this time is necessary with continued postop evaluation of his aorta examination of the kidneys will also be suitable for follow-up.  I've again reviewed with the patient planned hybrid procedure with replacement of aortic arch and stent grafting on the proximal descending thoracic aorta because of the persistent and enlarging pulsatile movement of his aortic arch and proximal descending aorta risk risk of death infection stroke myocardial infarction bleeding blood transfusion possible recurrent nerve injury renal failure have all been discussed with the patient in detail he understands the risk of nonoperative treatment and is willing to proceed.   The goals risks and alternatives of the planned surgical procedure Procedure(s): AORTIC ARCH  DEBRANCHING (N/A) Replacement of Aortic Arch with circ arrest (N/A) TRANSESOPHAGEAL ECHOCARDIOGRAM (TEE) (N/A) THORACIC AORTIC ENDOVASCULAR STENT GRAFT (N/A)  have been discussed with the patient in detail. The risks of the procedure including death, infection, stroke, myocardial infarction, bleeding, blood transfusion,recurrent nerve injury renal failure  have all been discussed specifically.  I have quoted Milton E Chaikin a 6 % of perioperative mortality and a complication rate as high as 50 %. The patient's questions have been answered.Shunsuke E Greb is willing  to proceed with the planned procedure.   Tobey Schmelzle B Zaiah Credeur MD      [address] [place] [address] Office [phone]   Beeper [phone]  01/21/2017 7:12 AM    

## 2017-01-21 NOTE — Anesthesia Preprocedure Evaluation (Addendum)
Anesthesia Evaluation  Patient identified by MRN, date of birth, ID band Patient awake    Reviewed: Allergy & Precautions, NPO status , Patient's Chart, lab work & pertinent test results  History of Anesthesia Complications (+) POST - OP SPINAL HEADACHE  Airway Mallampati: II  TM Distance: >3 FB Neck ROM: Full    Dental  (+) Poor Dentition, Loose, Dental Advisory Given   Pulmonary former smoker,    Pulmonary exam normal        Cardiovascular hypertension, Pt. on medications Normal cardiovascular exam  Previous Aortic Aneurysm repair 09/2014   Neuro/Psych    GI/Hepatic   Endo/Other    Renal/GU      Musculoskeletal   Abdominal   Peds  Hematology   Anesthesia Other Findings   Reproductive/Obstetrics                           Anesthesia Physical Anesthesia Plan  ASA: III  Anesthesia Plan: General   Post-op Pain Management:    Induction: Intravenous  PONV Risk Score and Plan: 2 and Ondansetron and Dexamethasone  Airway Management Planned: Oral ETT  Additional Equipment: Arterial line, CVP, PA Cath, TEE and Ultrasound Guidance Line Placement  Intra-op Plan:   Post-operative Plan: Post-operative intubation/ventilation  Informed Consent: I have reviewed the patients History and Physical, chart, labs and discussed the procedure including the risks, benefits and alternatives for the proposed anesthesia with the patient or authorized representative who has indicated his/her understanding and acceptance.     Plan Discussed with: CRNA and Surgeon  Anesthesia Plan Comments:         Anesthesia Quick Evaluation

## 2017-01-21 NOTE — Progress Notes (Signed)
Pt having short runs of VTach, 4-6 beat runs, MD paged, Orders given to start amio drip with a  bolus then /hr for 6hrs and then decrease to /hr

## 2017-01-21 NOTE — Anesthesia Procedure Notes (Signed)
Arterial Line Insertion Start/End9/28/2018 7:10 AM, 01/21/2017 7:20 AM Performed by: Annabelle Harman A, CRNA  Preanesthetic checklist: patient identified, IV checked, site marked, risks and benefits discussed, surgical consent, monitors and equipment checked and pre-op evaluation Lidocaine 1% used for infiltration and patient sedated Right, radial was placed Catheter size: 20 G Hand hygiene performed  and maximum sterile barriers used  Allen's test indicative of satisfactory collateral circulation Attempts: 1 Procedure performed without using ultrasound guided technique. Following insertion, dressing applied and Biopatch. Post procedure assessment: normal  Patient tolerated the procedure well with no immediate complications.

## 2017-01-22 ENCOUNTER — Inpatient Hospital Stay (HOSPITAL_COMMUNITY): Payer: Medicaid Other

## 2017-01-22 LAB — BPAM CRYOPRECIPITATE
BLOOD PRODUCT EXPIRATION DATE: 201809282350
BLOOD PRODUCT EXPIRATION DATE: 201809282350
ISSUE DATE / TIME: 201809281820
ISSUE DATE / TIME: 201809281820
Unit Type and Rh: 5100
Unit Type and Rh: 5100

## 2017-01-22 LAB — PREPARE CRYOPRECIPITATE
UNIT DIVISION: 0
Unit division: 0

## 2017-01-22 LAB — POCT I-STAT, CHEM 8
BUN: 17 mg/dL (ref 6–20)
Calcium, Ion: 1 mmol/L — ABNORMAL LOW (ref 1.15–1.40)
Chloride: 101 mmol/L (ref 101–111)
Creatinine, Ser: 1 mg/dL (ref 0.61–1.24)
Glucose, Bld: 109 mg/dL — ABNORMAL HIGH (ref 65–99)
HCT: 31 % — ABNORMAL LOW (ref 39.0–52.0)
Hemoglobin: 10.5 g/dL — ABNORMAL LOW (ref 13.0–17.0)
Potassium: 3.9 mmol/L (ref 3.5–5.1)
Sodium: 139 mmol/L (ref 135–145)
TCO2: 23 mmol/L (ref 22–32)

## 2017-01-22 LAB — POCT I-STAT 3, VENOUS BLOOD GAS (G3P V)
Acid-base deficit: 1 mmol/L (ref 0.0–2.0)
Bicarbonate: 22.6 mmol/L (ref 20.0–28.0)
O2 Saturation: 96 %
Patient temperature: 36.8
TCO2: 24 mmol/L (ref 22–32)
pCO2, Ven: 32.4 mmHg — ABNORMAL LOW (ref 44.0–60.0)
pH, Ven: 7.45 — ABNORMAL HIGH (ref 7.250–7.430)
pO2, Ven: 77 mmHg — ABNORMAL HIGH (ref 32.0–45.0)

## 2017-01-22 LAB — PREPARE FRESH FROZEN PLASMA
UNIT DIVISION: 0
UNIT DIVISION: 0

## 2017-01-22 LAB — GLUCOSE, CAPILLARY
Glucose-Capillary: 100 mg/dL — ABNORMAL HIGH (ref 65–99)
Glucose-Capillary: 100 mg/dL — ABNORMAL HIGH (ref 65–99)
Glucose-Capillary: 106 mg/dL — ABNORMAL HIGH (ref 65–99)
Glucose-Capillary: 112 mg/dL — ABNORMAL HIGH (ref 65–99)
Glucose-Capillary: 113 mg/dL — ABNORMAL HIGH (ref 65–99)
Glucose-Capillary: 138 mg/dL — ABNORMAL HIGH (ref 65–99)
Glucose-Capillary: 146 mg/dL — ABNORMAL HIGH (ref 65–99)
Glucose-Capillary: 152 mg/dL — ABNORMAL HIGH (ref 65–99)
Glucose-Capillary: 153 mg/dL — ABNORMAL HIGH (ref 65–99)
Glucose-Capillary: 158 mg/dL — ABNORMAL HIGH (ref 65–99)
Glucose-Capillary: 73 mg/dL (ref 65–99)
Glucose-Capillary: 80 mg/dL (ref 65–99)
Glucose-Capillary: 82 mg/dL (ref 65–99)
Glucose-Capillary: 84 mg/dL (ref 65–99)
Glucose-Capillary: 90 mg/dL (ref 65–99)

## 2017-01-22 LAB — CREATININE, SERUM
Creatinine, Ser: 1.29 mg/dL — ABNORMAL HIGH (ref 0.61–1.24)
GFR calc Af Amer: >60 mL/min (ref >60)
GFR calc non Af Amer: 59 mL/min — ABNORMAL LOW (ref >60)

## 2017-01-22 LAB — BPAM PLATELET PHERESIS
BLOOD PRODUCT EXPIRATION DATE: 201810012359
BLOOD PRODUCT EXPIRATION DATE: 201810012359
ISSUE DATE / TIME: 201809281742
ISSUE DATE / TIME: 201809281742
Unit Type and Rh: 6200
Unit Type and Rh: 8400

## 2017-01-22 LAB — BPAM FFP
BLOOD PRODUCT EXPIRATION DATE: 201810032359
BLOOD PRODUCT EXPIRATION DATE: 201810032359
ISSUE DATE / TIME: 201809281743
ISSUE DATE / TIME: 201809281743
UNIT TYPE AND RH: 6200
Unit Type and Rh: 6200

## 2017-01-22 LAB — POCT I-STAT 3, ART BLOOD GAS (G3+)
Acid-base deficit: 1 mmol/L (ref 0.0–2.0)
Bicarbonate: 23.2 mmol/L (ref 20.0–28.0)
O2 Saturation: 94 %
Patient temperature: 36.9
TCO2: 24 mmol/L (ref 22–32)
pCO2 arterial: 33.5 mmHg (ref 32.0–48.0)
pH, Arterial: 7.447 (ref 7.350–7.450)
pO2, Arterial: 67 mmHg — ABNORMAL LOW (ref 83.0–108.0)

## 2017-01-22 LAB — BASIC METABOLIC PANEL
Anion gap: 7 (ref 5–15)
BUN: 13 mg/dL (ref 6–20)
CALCIUM: 7 mg/dL — AB (ref 8.9–10.3)
CHLORIDE: 107 mmol/L (ref 101–111)
CO2: 24 mmol/L (ref 22–32)
CREATININE: 1.05 mg/dL (ref 0.61–1.24)
GFR calc non Af Amer: >60 mL/min (ref >60)
GLUCOSE: 134 mg/dL — AB (ref 65–99)
Potassium: 3.7 mmol/L (ref 3.5–5.1)
Sodium: 138 mmol/L (ref 135–145)

## 2017-01-22 LAB — PREPARE PLATELET PHERESIS
UNIT DIVISION: 0
Unit division: 0

## 2017-01-22 LAB — CBC
HCT: 28.9 % — ABNORMAL LOW (ref 39.0–52.0)
HCT: 31.3 % — ABNORMAL LOW (ref 39.0–52.0)
HEMOGLOBIN: 9.6 g/dL — AB (ref 13.0–17.0)
Hemoglobin: 10.2 g/dL — ABNORMAL LOW (ref 13.0–17.0)
MCH: 27.5 pg (ref 26.0–34.0)
MCH: 26.9 pg (ref 26.0–34.0)
MCHC: 33.2 g/dL (ref 30.0–36.0)
MCHC: 32.6 g/dL (ref 30.0–36.0)
MCV: 82.8 fL (ref 78.0–100.0)
MCV: 82.6 fL (ref 78.0–100.0)
Platelets: 101 10*3/uL — ABNORMAL LOW (ref 150–400)
Platelets: 109 10*3/uL — ABNORMAL LOW (ref 150–400)
RBC: 3.49 MIL/uL — AB (ref 4.22–5.81)
RBC: 3.79 MIL/uL — ABNORMAL LOW (ref 4.22–5.81)
RDW: 14 % (ref 11.5–15.5)
RDW: 14.2 % (ref 11.5–15.5)
WBC: 5.9 10*3/uL (ref 4.0–10.5)
WBC: 14.4 10*3/uL — ABNORMAL HIGH (ref 4.0–10.5)

## 2017-01-22 LAB — MAGNESIUM
Magnesium: 2.5 mg/dL — ABNORMAL HIGH (ref 1.7–2.4)
Magnesium: 2 mg/dL (ref 1.7–2.4)

## 2017-01-22 MED ORDER — FUROSEMIDE 10 MG/ML IJ SOLN
40.0000 mg | Freq: Once | INTRAMUSCULAR | Status: AC
  Administered 2017-01-22: 40 mg via INTRAVENOUS
  Filled 2017-01-22: qty 4

## 2017-01-22 MED ORDER — ENOXAPARIN SODIUM 30 MG/0.3ML ~~LOC~~ SOLN
30.0000 mg | Freq: Every day | SUBCUTANEOUS | Status: DC
  Administered 2017-01-22 – 2017-01-27 (×6): 30 mg via SUBCUTANEOUS
  Filled 2017-01-22 (×6): qty 0.3

## 2017-01-22 MED ORDER — INSULIN ASPART 100 UNIT/ML ~~LOC~~ SOLN: 0.0000 [IU] | SUBCUTANEOUS | Status: DC

## 2017-01-22 MED ORDER — POTASSIUM CHLORIDE 10 MEQ/50ML IV SOLN
10.0000 meq | INTRAVENOUS | Status: AC
  Administered 2017-01-22 (×3): 10 meq via INTRAVENOUS
  Filled 2017-01-22 (×2): qty 50

## 2017-01-22 MED ORDER — DOPAMINE-DEXTROSE 3.2-5 MG/ML-% IV SOLN: 5.0000 ug/kg/min | INTRAVENOUS | Status: DC

## 2017-01-22 NOTE — Progress Notes (Addendum)
   VASCULAR SURGERY ASSESSMENT & PLAN:   1 Day Post-Op s/p:  Aortic arch D branching with aorto to innominate, aorta to left common carotid artery, and aorta to subclavian bypass, and TVAR  Palpable pulses and moving all extremities.  SUBJECTIVE:   Sedated on vent.  PHYSICAL EXAM:   Vitals:   01/22/17 0615 01/22/17 0630 01/22/17 0645 01/22/17 0700  BP:    99/68  Pulse: 89 89 89 89  Resp: Temp: 98.6 F (37 C) 98.8 F (37.1 C) 98.8 F (37.1 C) 98.8 F (37.1 C)  TempSrc: Core (Comment) Core (Comment) Core (Comment) Core (Comment)  SpO2: 100% 100% 100% 100%  Weight:      Height:       He has woken up and is moving all extremities. He has palpable pedal pulses.  LABS:   Lab Results  Component Value Date   WBC 5.9 01/22/2017   HGB 9.6 (L) 01/22/2017   HCT 28.9 (L) 01/22/2017   MCV 82.8 01/22/2017   PLT 101 (L) 01/22/2017   Lab Results  Component Value Date   CREATININE 1.05 01/22/2017   Lab Results  Component Value Date   INR 1.09 01/21/2017   CBG (last 3)   Recent Labs  01/22/17 0514 01/22/17 0606 01/22/17 0651  GLUCAP 113* 106* 100*    PROBLEM LIST:    Active Problems:   S/P thoracic aortic aneurysm repair   CURRENT MEDS:   . acetaminophen  1,000 mg Oral Q6H   Or  . acetaminophen (TYLENOL) oral liquid 160 mg/5 mL  1,000 mg Per Tube Q6H  . aspirin EC  325 mg Oral Daily   Or  . aspirin  324 mg Per Tube Daily  . bisacodyl  10 mg Oral Daily   Or  . bisacodyl  10 mg Rectal Daily  . docusate sodium  200 mg Oral Daily  . insulin regular  0-10 Units Intravenous TID WC  . metoCLOPramide (REGLAN) injection  10 mg Intravenous Q6H  . metoprolol tartrate  12.5 mg Oral BID   Or  . metoprolol tartrate  12.5 mg Per Tube BID  . [START ON 01/23/2017] pantoprazole  40 mg Oral Daily  . sodium chloride flush  3 mL Intravenous Q12H    Cari Caraway Beeper: [phone] Office: [phone] 01/22/2017

## 2017-01-22 NOTE — Progress Notes (Signed)
Patient ID: Victor [name], male   DOB: 1955/05/11, 61 y.o.   MRN: [id number] TCTS DAILY ICU PROGRESS NOTE                   [address]            [name] [address]          [phone]   1 Day Post-Op Procedure(s) (LRB): AORTIC ARCH DEBRANCHING (N/A) Replacement of Aortic Arch with circulatory arrest (N/A) TRANSESOPHAGEAL ECHOCARDIOGRAM (TEE) (N/A) THORACIC AORTIC ENDOVASCULAR STENT GRAFT (N/A) REDO STERNOTOMY (N/A)  Total Length of Stay:  LOS: 1 day   Subjective: Awake and alert neuro intact , now extubated   Objective: Vital signs in last 24 hours: Temp:  [95 F (35 C)-99 F (37.2 C)] 99 F (37.2 C) (09/29 0800) Pulse Rate:  [79-90] 88 (09/29 0800) Cardiac Rhythm: Atrial paced (09/29 0700) Resp:  [12-19] 19 (09/29 0800) BP: (98-130)/(53-82) 114/78 (09/29 0800) SpO2:  [95 %-100 %] 96 % (09/29 0942) Arterial Line BP: (93-146)/(50-77) 127/65 (09/29 0800) FiO2 (%):  [50 %] 50 % (09/29 0741) Weight:  [165 lb 5.5 oz (75 kg)-186 lb 4.6 oz (84.5 kg)] 186 lb 4.6 oz (84.5 kg) (09/29 0445)  Filed Weights   01/21/17 0604 01/21/17 2245 01/22/17 0445  Weight: 166 lb (75.3 kg) (P) 165 lb 5.5 oz (75 kg) 186 lb 4.6 oz (84.5 kg)    Weight change: 20 lb 4.6 oz (9.203 kg)   Hemodynamic parameters for last 24 hours: PAP: (19-26)/(7-15) 26/15 CO:  [2.5 L/min-2.7 L/min] 2.7 L/min CI:  [1.3 L/min/m2-1.5 L/min/m2] 1.5 L/min/m2  Intake/Output from previous day: 09/28 0701 - 09/29 0700 In: 9104.1 [I.V.:6393.1; Blood:1981; NG/GT:30; IV Piggyback:700] Out: 6540 [Urine:4680; Emesis/NG output:200; Blood:1500; Chest Tube:160]  Intake/Output this shift: Total I/O In: 122.6 [I.V.:122.6] Out: -   Current Meds: Scheduled Meds: . acetaminophen  1,000 mg Oral Q6H   Or  . acetaminophen (TYLENOL) oral liquid 160 mg/5 mL  1,000 mg Per Tube Q6H  . aspirin EC  325 mg Oral Daily   Or  . aspirin  324 mg Per Tube Daily  . bisacodyl  10 mg Oral Daily   Or  . bisacodyl  10  mg Rectal Daily  . docusate sodium  200 mg Oral Daily  . insulin regular  0-10 Units Intravenous TID WC  . metoCLOPramide (REGLAN) injection  10 mg Intravenous Q6H  . metoprolol tartrate  12.5 mg Oral BID   Or  . metoprolol tartrate  12.5 mg Per Tube BID  . [START ON 01/23/2017] pantoprazole  40 mg Oral Daily  . sodium chloride flush  3 mL Intravenous Q12H   Continuous Infusions: . sodium chloride 20 mL/hr at 01/21/17 2308  . sodium chloride    . sodium chloride 20 mL/hr at 01/21/17 2145  . amiodarone 30 mg/hr (01/22/17 0800)  . cefUROXime (ZINACEF)  IV Stopped (01/21/17 2304)  . dexmedetomidine 0.7 mcg/kg/hr (01/22/17 0800)  . DOPamine 5 mcg/kg/min (01/22/17 0800)  . famotidine (PEPCID) IV Stopped (01/21/17 2305)  . insulin (NOVOLIN-R) infusion 1.6 Units/hr (01/22/17 0700)  . lactated ringers    . lactated ringers 20 mL/hr at 01/21/17 2145  . lactated ringers 20 mL/hr at 01/21/17 2145  . nitroGLYCERIN 15 mcg/min (01/22/17 0800)  . norepinephrine (LEVOPHED) Adult infusion    . phenylephrine (NEO-SYNEPHRINE) Adult infusion Stopped (01/22/17 0745)  . potassium chloride Stopped (01/22/17 1049)  . vasopressin (PITRESSIN) infusion - *FOR SHOCK*  PRN Meds:.sodium chloride, lactated ringers, metoprolol tartrate, midazolam, morphine injection, ondansetron (ZOFRAN) IV, oxyCODONE, sodium chloride flush, traMADol  General appearance: alert, cooperative and no distress Neurologic: intact Heart: regular rate and rhythm, S1, S2 normal, no murmur, click, rub or gallop Lungs: diminished breath sounds bibasilar Abdomen: soft, non-tender; bowel sounds normal; no masses,  no organomegaly Extremities: extremities normal, atraumatic, no cyanosis or edema and Homans sign is negative, no sign of DVT Wound: intact Recurrent nerve intact, palpable dp and pt pulses bilateral   Lab Results: CBC: Recent Labs  01/21/17 2148 01/22/17 0400  WBC 13.3* 5.9  HGB 11.3* 9.6*  HCT 34.2* 28.9*  PLT 110*  101*   BMET:  Recent Labs  01/22/17 0400  NA 138  K 3.7  CL 107  CO2 24  GLUCOSE 134*  BUN 13  CREATININE 1.05  CALCIUM 7.0*    CMET: Lab Results  Component Value Date   WBC 5.9 01/22/2017   HGB 9.6 (L) 01/22/2017   HCT 28.9 (L) 01/22/2017   PLT 101 (L) 01/22/2017   GLUCOSE 134 (H) 01/22/2017   CHOL 121 12/23/2016   TRIG 57 12/23/2016   HDL 43 12/23/2016   LDLCALC 67 12/23/2016   ALT 16 (L) 01/19/2017   AST 17 01/19/2017   NA 138 01/22/2017   K 3.7 01/22/2017   CL 107 01/22/2017   CREATININE 1.05 01/22/2017   BUN 13 01/22/2017   CO2 24 01/22/2017   INR 1.09 01/21/2017   HGBA1C 5.8 (H) 01/19/2017      PT/INR:  Recent Labs  01/21/17 2148  LABPROT 14.0  INR 1.09   Radiology: Dg Chest Port 1 View  Result Date: 01/22/2017 CLINICAL DATA:  Status post thoracic aortic aneurysm repair EXAM: PORTABLE CHEST 1 VIEW COMPARISON:  Yesterday FINDINGS: Endotracheal tube tip between the clavicular heads and carina. An orogastric tube reaches the stomach. Swan-Ganz catheter with tip directed towards the right main pulmonary artery. Thoracic drains are present. There is an aortic aneurysm with stent graft in place. Grossly stable cardiopericardial enlargement. Haziness of the left chest from layering pleural fluid and atelectasis presumably. The right lung is somewhat lucent. IMPRESSION: 1. Stable positioning of tubes and central line. 2. Unchanged atelectasis and layering fluid on the left. Electronically Signed   By: Marnee Spring M.D.   On: 01/22/2017 07:50   Dg Chest Port 1 View  Result Date: 01/21/2017 CLINICAL DATA:  Thoracic aortic aneurysm repair EXAM: PORTABLE CHEST 1 VIEW COMPARISON:  Chest radiograph 01/19/2017 FINDINGS: There is a thoracic aortic endograft stent is now present. There is left hemithoracic volume loss and mild to moderate pulmonary edema. The right lung is clear. There is a right internal jugular venous approach pulmonary arterial catheter with tip  probably within the proximal right pulmonary artery. Endotracheal tube tip is at the level of the clavicular heads. Enteric tube courses beyond the field of view. IMPRESSION: 1. Status post placement of thoracic aortic endograft with left lung volume loss and mild edema. 2. Pulmonary arterial catheter tip near the origin of the right pulmonary artery. Electronically Signed   By: Deatra Robinson M.D.   On: 01/21/2017 22:15     Assessment/Plan: S/P Procedure(s) (LRB): AORTIC ARCH DEBRANCHING (N/A) Replacement of Aortic Arch with circulatory arrest (N/A) TRANSESOPHAGEAL ECHOCARDIOGRAM (TEE) (N/A) THORACIC AORTIC ENDOVASCULAR STENT GRAFT (N/A) REDO STERNOTOMY (N/A) Mobilize Diuresis Diabetes control Continue foley due to strict I&O, patient in ICU and urinary output monitoring See progression orders Expected Acute  Blood - loss  Anemia Now extubated , neuro intact   [name] 01/22/2017 10:33 AM

## 2017-01-22 NOTE — Procedures (Signed)
Extubation Procedure Note  Patient Details:   Name: Victor [name] DOB: 1955/07/01 MRN: [id number]   Airway Documentation:     Evaluation  O2 sats: stable throughout Complications: No apparent complications Patient did tolerate procedure well. Bilateral Breath Sounds: Clear, Diminished   Yes   Pt extubated to 4L [place] per Open Heart Rapid Wean Protocol. Pt stable throughout with no complications. Pt VS within normal limits. NIF -30, VC 1.1, ABG within normal limits. Pt able to speak and has a strong productive cough post extubation. Pt encouraged to use [place] to clear secretions. RT will continue to monitor.   Carolan Shiver 01/22/2017, 9:43 AM

## 2017-01-22 NOTE — Progress Notes (Signed)
Initiated Rapid Wean per Open Heart Protocol. RN at bedside.

## 2017-01-22 NOTE — Progress Notes (Signed)
Patient ID: Victor [name], male   DOB: 1956/02/01, 61 y.o.   MRN: [id number] EVENING ROUNDS NOTE :     [address]       [name] [address]             [phone]                 1 Day Post-Op Procedure(s) (LRB): AORTIC ARCH DEBRANCHING (N/A) Replacement of Aortic Arch with circulatory arrest (N/A) TRANSESOPHAGEAL ECHOCARDIOGRAM (TEE) (N/A) THORACIC AORTIC ENDOVASCULAR STENT GRAFT (N/A) REDO STERNOTOMY (N/A)  Total Length of Stay:  LOS: 1 day  BP 129/64   Pulse 76   Temp 98.3 F (36.8 C) (Oral)   Resp 17   Ht (P) 5' 6.93" (1.7 m)   Wt 186 lb 4.6 oz (84.5 kg)   SpO2 97%   BMI (P) 29.24 kg/m   .Intake/Output      09/29 0701 - 09/30 0700   P.O. 300   I.V. (mL/kg) 691.3 (8.2)   IV Piggyback 80   Total Intake(mL/kg) 1071.3 (12.7)   Urine (mL/kg/hr) 2825 (2.5)   Emesis/NG output 80   Chest Tube 210   Total Output 3115   Net -2043.7         . sodium chloride Stopped (01/22/17 1145)  . sodium chloride    . sodium chloride Stopped (01/22/17 1200)  . cefUROXime (ZINACEF)  IV Stopped (01/22/17 1124)  . famotidine (PEPCID) IV Stopped (01/21/17 2305)  . insulin (NOVOLIN-R) infusion Stopped (01/22/17 1200)  . lactated ringers    . lactated ringers Stopped (01/22/17 1000)  . lactated ringers 10 mL/hr at 01/22/17 1000  . phenylephrine (NEO-SYNEPHRINE) Adult infusion Stopped (01/22/17 0745)     Lab Results  Component Value Date   WBC 14.4 (H) 01/22/2017   HGB 10.2 (L) 01/22/2017   HCT 31.3 (L) 01/22/2017   PLT 109 (L) 01/22/2017   GLUCOSE 109 (H) 01/22/2017   CHOL 121 12/23/2016   TRIG 57 12/23/2016   HDL 43 12/23/2016   LDLCALC 67 12/23/2016   ALT 16 (L) 01/19/2017   AST 17 01/19/2017   NA 139 01/22/2017   K 3.9 01/22/2017   CL 101 01/22/2017   CREATININE 1.29 (H) 01/22/2017   BUN 17 01/22/2017   CO2 24 01/22/2017   INR 1.09 01/21/2017   HGBA1C 5.8 (H) 01/19/2017   Up to chair Awake and alert, neuro intact Sinus rhythm , no ectopy ,  d/c Cordarone   [name] MD  Beeper [phone] Office [phone] 01/22/2017 8:13 PM

## 2017-01-23 ENCOUNTER — Inpatient Hospital Stay (HOSPITAL_COMMUNITY): Payer: Medicaid Other

## 2017-01-23 LAB — BASIC METABOLIC PANEL
Anion gap: 10 (ref 5–15)
BUN: 18 mg/dL (ref 6–20)
CO2: 25 mmol/L (ref 22–32)
Calcium: 7.7 mg/dL — ABNORMAL LOW (ref 8.9–10.3)
Chloride: 102 mmol/L (ref 101–111)
Creatinine, Ser: 1.32 mg/dL — ABNORMAL HIGH (ref 0.61–1.24)
GFR calc Af Amer: >60 mL/min (ref >60)
GFR calc non Af Amer: 57 mL/min — ABNORMAL LOW (ref >60)
Glucose, Bld: 109 mg/dL — ABNORMAL HIGH (ref 65–99)
Potassium: 4 mmol/L (ref 3.5–5.1)
Sodium: 137 mmol/L (ref 135–145)

## 2017-01-23 LAB — POCT I-STAT, CHEM 8
BUN: 20 mg/dL (ref 6–20)
BUN: 18 mg/dL (ref 6–20)
BUN: 18 mg/dL (ref 6–20)
BUN: 17 mg/dL (ref 6–20)
BUN: 17 mg/dL (ref 6–20)
BUN: 17 mg/dL (ref 6–20)
BUN: 16 mg/dL (ref 6–20)
BUN: 16 mg/dL (ref 6–20)
Calcium, Ion: 1.23 mmol/L (ref 1.15–1.40)
Calcium, Ion: 1.19 mmol/L (ref 1.15–1.40)
Calcium, Ion: 1.15 mmol/L (ref 1.15–1.40)
Calcium, Ion: 0.96 mmol/L — ABNORMAL LOW (ref 1.15–1.40)
Calcium, Ion: 0.95 mmol/L — ABNORMAL LOW (ref 1.15–1.40)
Calcium, Ion: 0.83 mmol/L — CL (ref 1.15–1.40)
Calcium, Ion: 0.72 mmol/L — CL (ref 1.15–1.40)
Calcium, Ion: 0.6 mmol/L — CL (ref 1.15–1.40)
Chloride: 103 mmol/L (ref 101–111)
Chloride: 104 mmol/L (ref 101–111)
Chloride: 105 mmol/L (ref 101–111)
Chloride: 101 mmol/L (ref 101–111)
Chloride: 101 mmol/L (ref 101–111)
Chloride: 97 mmol/L — ABNORMAL LOW (ref 101–111)
Chloride: 101 mmol/L (ref 101–111)
Chloride: 100 mmol/L — ABNORMAL LOW (ref 101–111)
Creatinine, Ser: 0.9 mg/dL (ref 0.61–1.24)
Creatinine, Ser: 0.8 mg/dL (ref 0.61–1.24)
Creatinine, Ser: 0.8 mg/dL (ref 0.61–1.24)
Creatinine, Ser: 0.6 mg/dL — ABNORMAL LOW (ref 0.61–1.24)
Creatinine, Ser: 0.8 mg/dL (ref 0.61–1.24)
Creatinine, Ser: 0.7 mg/dL (ref 0.61–1.24)
Creatinine, Ser: 0.6 mg/dL — ABNORMAL LOW (ref 0.61–1.24)
Creatinine, Ser: 0.6 mg/dL — ABNORMAL LOW (ref 0.61–1.24)
Glucose, Bld: 84 mg/dL (ref 65–99)
Glucose, Bld: 85 mg/dL (ref 65–99)
Glucose, Bld: 85 mg/dL (ref 65–99)
Glucose, Bld: 92 mg/dL (ref 65–99)
Glucose, Bld: 86 mg/dL (ref 65–99)
Glucose, Bld: 98 mg/dL (ref 65–99)
Glucose, Bld: 113 mg/dL — ABNORMAL HIGH (ref 65–99)
Glucose, Bld: 126 mg/dL — ABNORMAL HIGH (ref 65–99)
HCT: 39 % (ref 39.0–52.0)
HCT: 35 % — ABNORMAL LOW (ref 39.0–52.0)
HCT: 36 % — ABNORMAL LOW (ref 39.0–52.0)
HCT: 31 % — ABNORMAL LOW (ref 39.0–52.0)
HCT: 30 % — ABNORMAL LOW (ref 39.0–52.0)
HCT: 28 % — ABNORMAL LOW (ref 39.0–52.0)
HCT: 25 % — ABNORMAL LOW (ref 39.0–52.0)
HCT: 30 % — ABNORMAL LOW (ref 39.0–52.0)
Hemoglobin: 13.3 g/dL (ref 13.0–17.0)
Hemoglobin: 11.9 g/dL — ABNORMAL LOW (ref 13.0–17.0)
Hemoglobin: 12.2 g/dL — ABNORMAL LOW (ref 13.0–17.0)
Hemoglobin: 10.5 g/dL — ABNORMAL LOW (ref 13.0–17.0)
Hemoglobin: 10.2 g/dL — ABNORMAL LOW (ref 13.0–17.0)
Hemoglobin: 9.5 g/dL — ABNORMAL LOW (ref 13.0–17.0)
Hemoglobin: 8.5 g/dL — ABNORMAL LOW (ref 13.0–17.0)
Hemoglobin: 10.2 g/dL — ABNORMAL LOW (ref 13.0–17.0)
Potassium: 3.6 mmol/L (ref 3.5–5.1)
Potassium: 3.7 mmol/L (ref 3.5–5.1)
Potassium: 3.8 mmol/L (ref 3.5–5.1)
Potassium: 4 mmol/L (ref 3.5–5.1)
Potassium: 4.3 mmol/L (ref 3.5–5.1)
Potassium: 3.6 mmol/L (ref 3.5–5.1)
Potassium: 4.6 mmol/L (ref 3.5–5.1)
Potassium: 4.2 mmol/L (ref 3.5–5.1)
Sodium: 142 mmol/L (ref 135–145)
Sodium: 140 mmol/L (ref 135–145)
Sodium: 140 mmol/L (ref 135–145)
Sodium: 140 mmol/L (ref 135–145)
Sodium: 138 mmol/L (ref 135–145)
Sodium: 131 mmol/L — ABNORMAL LOW (ref 135–145)
Sodium: 138 mmol/L (ref 135–145)
Sodium: 141 mmol/L (ref 135–145)
TCO2: 25 mmol/L (ref 22–32)
TCO2: 27 mmol/L (ref 22–32)
TCO2: 26 mmol/L (ref 22–32)
TCO2: 28 mmol/L (ref 22–32)
TCO2: 27 mmol/L (ref 22–32)
TCO2: 22 mmol/L (ref 22–32)
TCO2: 28 mmol/L (ref 22–32)
TCO2: 26 mmol/L (ref 22–32)

## 2017-01-23 LAB — POCT I-STAT 3, ART BLOOD GAS (G3+)
Acid-Base Excess: 4 mmol/L — ABNORMAL HIGH (ref 0.0–2.0)
Acid-base deficit: 6 mmol/L — ABNORMAL HIGH (ref 0.0–2.0)
Bicarbonate: 30.4 mmol/L — ABNORMAL HIGH (ref 20.0–28.0)
Bicarbonate: 18.7 mmol/L — ABNORMAL LOW (ref 20.0–28.0)
O2 Saturation: 100 %
O2 Saturation: 100 %
TCO2: 32 mmol/L (ref 22–32)
TCO2: 20 mmol/L — ABNORMAL LOW (ref 22–32)
pCO2 arterial: 52.3 mmHg — ABNORMAL HIGH (ref 32.0–48.0)
pCO2 arterial: 34.5 mmHg (ref 32.0–48.0)
pH, Arterial: 7.372 (ref 7.350–7.450)
pH, Arterial: 7.343 — ABNORMAL LOW (ref 7.350–7.450)
pO2, Arterial: 520 mmHg — ABNORMAL HIGH (ref 83.0–108.0)
pO2, Arterial: 413 mmHg — ABNORMAL HIGH (ref 83.0–108.0)

## 2017-01-23 LAB — CBC
HCT: 29.7 % — ABNORMAL LOW (ref 39.0–52.0)
Hemoglobin: 9.8 g/dL — ABNORMAL LOW (ref 13.0–17.0)
MCH: 27.6 pg (ref 26.0–34.0)
MCHC: 33 g/dL (ref 30.0–36.0)
MCV: 83.7 fL (ref 78.0–100.0)
Platelets: 103 10*3/uL — ABNORMAL LOW (ref 150–400)
RBC: 3.55 MIL/uL — ABNORMAL LOW (ref 4.22–5.81)
RDW: 14.4 % (ref 11.5–15.5)
WBC: 13.5 10*3/uL — ABNORMAL HIGH (ref 4.0–10.5)

## 2017-01-23 LAB — GLUCOSE, CAPILLARY
Glucose-Capillary: 77 mg/dL (ref 65–99)
Glucose-Capillary: 56 mg/dL — ABNORMAL LOW (ref 65–99)
Glucose-Capillary: 89 mg/dL (ref 65–99)

## 2017-01-23 MED ORDER — OXYCODONE HCL 5 MG PO TABS
10.0000 mg | ORAL_TABLET | ORAL | Status: DC | PRN
  Administered 2017-01-23 – 2017-01-27 (×18): 10 mg via ORAL
  Filled 2017-01-23 (×18): qty 2

## 2017-01-23 MED ORDER — FUROSEMIDE 40 MG PO TABS
40.0000 mg | ORAL_TABLET | Freq: Every day | ORAL | Status: DC
  Administered 2017-01-23 – 2017-01-28 (×6): 40 mg via ORAL
  Filled 2017-01-23 (×6): qty 1

## 2017-01-23 MED ORDER — POTASSIUM CHLORIDE CRYS ER 20 MEQ PO TBCR
20.0000 meq | EXTENDED_RELEASE_TABLET | Freq: Every day | ORAL | Status: DC
  Administered 2017-01-23 – 2017-01-28 (×6): 20 meq via ORAL
  Filled 2017-01-23 (×6): qty 1

## 2017-01-23 MED ORDER — MORPHINE SULFATE ER 15 MG PO TBCR
15.0000 mg | EXTENDED_RELEASE_TABLET | Freq: Two times a day (BID) | ORAL | Status: DC
  Administered 2017-01-23 – 2017-01-24 (×3): 15 mg via ORAL
  Filled 2017-01-23 (×3): qty 1

## 2017-01-23 MED ORDER — LOSARTAN POTASSIUM 25 MG PO TABS
25.0000 mg | ORAL_TABLET | Freq: Every day | ORAL | Status: DC
  Administered 2017-01-23 – 2017-01-28 (×6): 25 mg via ORAL
  Filled 2017-01-23 (×6): qty 1

## 2017-01-23 NOTE — Progress Notes (Signed)
Hypoglycemic Event  CBG: 56  Treatment: 15g carbs given (orange juice); recheck in cbg in 15  Symptoms: asymptomatic  Follow-up CBG: Time: 0809 CBG Result: 89  Possible Reasons for Event: decreased intake  Comments/MD notified: Marlis Edelson

## 2017-01-23 NOTE — Progress Notes (Signed)
   VASCULAR SURGERY ASSESSMENT & PLAN:   2 Days Post-Op s/p: Aortic arch debranching with aorto to innominate, aorta to left common carotid artery, and aorta to subclavian bypass, and TVAR  Doing excellent. Extubated. Palpable radial and DP pulses.   SUBJECTIVE:   Pain adequately controlled.   PHYSICAL EXAM:   Vitals:   01/23/17 0000 01/23/17 0100 01/23/17 0353 01/23/17 0400  BP: 118/73 115/71  (!) 143/71  Pulse: 72 75  82  Resp: 19 14  (!) 23  Temp:   98.6 F (37 C)   TempSrc:   Oral   SpO2: 100% 100%  93%  Weight:    175 lb 14.8 oz (79.8 kg)  Height:       Palpable radial pulses Palpable DP pulses  LABS:   Lab Results  Component Value Date   WBC 13.5 (H) 01/23/2017   HGB 9.8 (L) 01/23/2017   HCT 29.7 (L) 01/23/2017   MCV 83.7 01/23/2017   PLT 103 (L) 01/23/2017   Lab Results  Component Value Date   CREATININE 1.32 (H) 01/23/2017   Lab Results  Component Value Date   INR 1.09 01/21/2017   CBG (last 3)   Recent Labs  01/22/17 2002 01/22/17 2330 01/23/17 0355  GLUCAP 112* 80 77    PROBLEM LIST:    Active Problems:   S/P thoracic aortic aneurysm repair   CURRENT MEDS:   . acetaminophen  1,000 mg Oral Q6H   Or  . acetaminophen (TYLENOL) oral liquid 160 mg/5 mL  1,000 mg Per Tube Q6H  . aspirin EC  325 mg Oral Daily   Or  . aspirin  324 mg Per Tube Daily  . bisacodyl  10 mg Oral Daily   Or  . bisacodyl  10 mg Rectal Daily  . docusate sodium  200 mg Oral Daily  . enoxaparin (LOVENOX) injection  30 mg Subcutaneous QHS  . insulin aspart  0-24 Units Subcutaneous Q4H  . metoprolol tartrate  12.5 mg Oral BID   Or  . metoprolol tartrate  12.5 mg Per Tube BID  . pantoprazole  40 mg Oral Daily  . sodium chloride flush  3 mL Intravenous Q12H    Cari Caraway Beeper: [phone] Office: [phone] 01/23/2017

## 2017-01-23 NOTE — Progress Notes (Addendum)
TCTS DAILY ICU PROGRESS NOTE                   [address]            [name] [address]          [phone]   2 Days Post-Op Procedure(s) (LRB): AORTIC ARCH DEBRANCHING (N/A) Replacement of Aortic Arch with circulatory arrest (N/A) TRANSESOPHAGEAL ECHOCARDIOGRAM (TEE) (N/A) THORACIC AORTIC ENDOVASCULAR STENT GRAFT (N/A) REDO STERNOTOMY (N/A)  Total Length of Stay:  LOS: 2 days   Subjective:   Patient complains of chest and back pain.  States back pain is chronic for him and admits he takes more pain medication than prescribed at home.  Objective: Vital signs in last 24 hours: Temp:  [97.5 F (36.4 C)-98.7 F (37.1 C)] 98.7 F (37.1 C) (09/30 0700) Pulse Rate:  [50-90] 89 (09/30 0900) Cardiac Rhythm: Atrial paced (09/30 0400) Resp:  [12-29] 27 (09/30 0900) BP: (89-162)/(61-85) 162/81 (09/30 0800) SpO2:  [63 %-100 %] 100 % (09/30 0900) Arterial Line BP: (118-174)/(38-67) 174/53 (09/29 2000) Weight:  [175 lb 14.8 oz (79.8 kg)] 175 lb 14.8 oz (79.8 kg) (09/30 0400)  Filed Weights   01/21/17 2245 01/22/17 0445 01/23/17 0400  Weight: (P) 165 lb 5.5 oz (75 kg) 186 lb 4.6 oz (84.5 kg) 175 lb 14.8 oz (79.8 kg)    Weight change: -10 lb 5.8 oz (-4.7 kg)   Hemodynamic parameters for last 24 hours: PAP: (30)/(17) 30/17 CO:  [4.4 L/min] 4.4 L/min CI:  [2.3 L/min/m2] 2.3 L/min/m2  Intake/Output from previous day: 09/29 0701 - 09/30 0700 In: 1221.3 [P.O.:300; I.V.:791.3; IV Piggyback:130] Out: 3771 [Urine:3261; Emesis/NG output:80; Chest Tube:430]  Intake/Output this shift: Total I/O In: 170 [P.O.:120; IV Piggyback:50] Out: -   Current Meds: Scheduled Meds: . acetaminophen  1,000 mg Oral Q6H   Or  . acetaminophen (TYLENOL) oral liquid 160 mg/5 mL  1,000 mg Per Tube Q6H  . aspirin EC  325 mg Oral Daily   Or  . aspirin  324 mg Per Tube Daily  . bisacodyl  10 mg Oral Daily   Or  . bisacodyl  10 mg Rectal Daily  . docusate sodium  200 mg Oral Daily   . enoxaparin (LOVENOX) injection  30 mg Subcutaneous QHS  . metoprolol tartrate  12.5 mg Oral BID   Or  . metoprolol tartrate  12.5 mg Per Tube BID  . morphine  15 mg Oral Q12H  . pantoprazole  40 mg Oral Daily  . sodium chloride flush  3 mL Intravenous Q12H   Continuous Infusions: . sodium chloride Stopped (01/22/17 1145)  . sodium chloride    . sodium chloride 10 mL/hr (01/23/17 9147)  . lactated ringers    . lactated ringers Stopped (01/22/17 1000)  . lactated ringers 10 mL/hr at 01/23/17 0400  . phenylephrine (NEO-SYNEPHRINE) Adult infusion Stopped (01/22/17 0745)   PRN Meds:.sodium chloride, lactated ringers, metoprolol tartrate, ondansetron (ZOFRAN) IV, oxyCODONE, sodium chloride flush, traMADol  General appearance: alert, cooperative and no distress Heart: regular rate and rhythm Lungs: clear to auscultation bilaterally Abdomen: soft, non-tender; bowel sounds normal; no masses,  no organomegaly Extremities: extremities normal, atraumatic, no cyanosis or edema Wound: aquacel in place  Lab Results: CBC: Recent Labs  01/22/17 1717 01/23/17 0422  WBC 14.4* 13.5*  HGB 10.2* 9.8*  HCT 31.3* 29.7*  PLT 109* 103*   BMET:  Recent Labs  01/22/17 0400 01/22/17 1647 01/22/17 1717 01/23/17 0422  NA  138 139  --  137  K 3.7 3.9  --  4.0  CL 107 101  --  102  CO2 24  --   --  25  GLUCOSE 134* 109*  --  109*  BUN 13 17  --  18  CREATININE 1.05 1.00 1.29* 1.32*  CALCIUM 7.0*  --   --  7.7*    CMET: Lab Results  Component Value Date   WBC 13.5 (H) 01/23/2017   HGB 9.8 (L) 01/23/2017   HCT 29.7 (L) 01/23/2017   PLT 103 (L) 01/23/2017   GLUCOSE 109 (H) 01/23/2017   CHOL 121 12/23/2016   TRIG 57 12/23/2016   HDL 43 12/23/2016   LDLCALC 67 12/23/2016   ALT 16 (L) 01/19/2017   AST 17 01/19/2017   NA 137 01/23/2017   K 4.0 01/23/2017   CL 102 01/23/2017   CREATININE 1.32 (H) 01/23/2017   BUN 18 01/23/2017   CO2 25 01/23/2017   INR 1.09 01/21/2017   HGBA1C 5.8  (H) 01/19/2017      PT/INR:  Recent Labs  01/21/17 2148  LABPROT 14.0  INR 1.09   Radiology: Dg Chest Port 1 View  Result Date: 01/23/2017 CLINICAL DATA:  Postop check. EXAM: PORTABLE CHEST 1 VIEW COMPARISON:  01/22/2017 FINDINGS: Status post removal of endotracheal tube, nasogastric tube, and Swan-Ganz catheter. Mediastinal drains and right IJ sheath remain in place. Status post median sternotomy and aortic stent graft. The heart is enlarged. There are bibasilar opacities which partially obscure the hemidiaphragms. Opacity at the right lung base has increased. Opacity at the left lung base appears stable. IMPRESSION: 1. Removal of endotracheal tube and nasogastric tube, Swan-Ganz catheter. 2. Stable left basilar opacity. 3. Increased right basilar atelectasis or developing infiltrate. Electronically Signed   By: Norva Pavlov M.D.   On: 01/23/2017 10:08     Assessment/Plan: S/P Procedure(s) (LRB): AORTIC ARCH DEBRANCHING (N/A) Replacement of Aortic Arch with circulatory arrest (N/A) TRANSESOPHAGEAL ECHOCARDIOGRAM (TEE) (N/A) THORACIC AORTIC ENDOVASCULAR STENT GRAFT (N/A) REDO STERNOTOMY (N/A)  1. CV- hemodynamically stable, HTN- will continue Lopressor, start Cozaar for BP control 2. Pulm- no acute issues, continue IS 3. Renal- creatinine at 1.32, weigth is mildly elevated, continue Lasix 4. Expected post operative anemia- Hgb stable at 9.8 5. Thrombocytopenia-stable 6. Pain control- patient unfortunately takes more percocet than prescribed at home... Will change Oxy IR to 10 mg every 4 hours, and start 15 mg MS Cotin BID... Stop IV pain medication 7. Dispo- patient stable, pain control is biggest issues adjustment made, diurese, continue current care, possibly remove chest tubes today     BARRETT, ERIN 01/23/2017 11:20 AM   Stable day  Still issues with pain medication D/c MT tubes  I have seen and examined [name] and agree with the above assessment  and  plan.  [name] MD Beeper [phone] Office [phone] 01/23/2017 6:57 PM

## 2017-01-23 NOTE — Anesthesia Postprocedure Evaluation (Signed)
Anesthesia Post Note  Patient: Victor [name]  Procedure(s) Performed: AORTIC ARCH DEBRANCHING (N/A ) Replacement of Aortic Arch with circulatory arrest (N/A Chest) TRANSESOPHAGEAL ECHOCARDIOGRAM (TEE) (N/A ) REDO STERNOTOMY (N/A ) THORACIC AORTIC ENDOVASCULAR STENT GRAFT (N/A )     Patient location during evaluation: SICU Anesthesia Type: General Level of consciousness: sedated Pain management: pain level controlled Vital Signs Assessment: post-procedure vital signs reviewed and stable Respiratory status: patient remains intubated per anesthesia plan Cardiovascular status: stable Postop Assessment: no apparent nausea or vomiting Anesthetic complications: no    Last Vitals:  Vitals:   01/23/17 1137 01/23/17 1200  BP: 132/71 137/74  Pulse:  80  Resp: 17 14  Temp: 37.3 C   SpO2: 94% 97%    Last Pain:  Vitals:   01/23/17 1200  TempSrc:   PainSc: 8                  Kiauna Zywicki S

## 2017-01-24 ENCOUNTER — Inpatient Hospital Stay (HOSPITAL_COMMUNITY): Payer: Medicaid Other

## 2017-01-24 ENCOUNTER — Encounter (HOSPITAL_COMMUNITY): Payer: Self-pay | Admitting: Cardiothoracic Surgery

## 2017-01-24 DIAGNOSIS — Z9889 Other specified postprocedural states: Secondary | ICD-10-CM

## 2017-01-24 DIAGNOSIS — Z8679 Personal history of other diseases of the circulatory system: Secondary | ICD-10-CM

## 2017-01-24 LAB — CBC
HEMATOCRIT: 29.4 % — AB (ref 39.0–52.0)
HEMOGLOBIN: 9.9 g/dL — AB (ref 13.0–17.0)
MCH: 28.4 pg (ref 26.0–34.0)
MCHC: 33.7 g/dL (ref 30.0–36.0)
MCV: 84.5 fL (ref 78.0–100.0)
Platelets: 114 10*3/uL — ABNORMAL LOW (ref 150–400)
RBC: 3.48 MIL/uL — AB (ref 4.22–5.81)
RDW: 14.9 % (ref 11.5–15.5)
WBC: 13.3 10*3/uL — ABNORMAL HIGH (ref 4.0–10.5)

## 2017-01-24 LAB — EXPECTORATED SPUTUM ASSESSMENT W GRAM STAIN, RFLX TO RESP C

## 2017-01-24 LAB — GLUCOSE, CAPILLARY: Glucose-Capillary: 106 mg/dL — ABNORMAL HIGH (ref 65–99)

## 2017-01-24 LAB — BASIC METABOLIC PANEL
ANION GAP: 7 (ref 5–15)
BUN: 18 mg/dL (ref 6–20)
CALCIUM: 7.8 mg/dL — AB (ref 8.9–10.3)
CHLORIDE: 105 mmol/L (ref 101–111)
CO2: 25 mmol/L (ref 22–32)
Creatinine, Ser: 1.18 mg/dL (ref 0.61–1.24)
GFR calc non Af Amer: >60 mL/min (ref >60)
GLUCOSE: 83 mg/dL (ref 65–99)
POTASSIUM: 4.4 mmol/L (ref 3.5–5.1)
Sodium: 137 mmol/L (ref 135–145)

## 2017-01-24 LAB — POCT I-STAT 4, (NA,K, GLUC, HGB,HCT)
Glucose, Bld: 133 mg/dL — ABNORMAL HIGH (ref 65–99)
HCT: 31 % — ABNORMAL LOW (ref 39.0–52.0)
Hemoglobin: 10.5 g/dL — ABNORMAL LOW (ref 13.0–17.0)
Potassium: 4.2 mmol/L (ref 3.5–5.1)
Sodium: 142 mmol/L (ref 135–145)

## 2017-01-24 MED ORDER — ACETYLCYSTEINE 20 % IN SOLN
2.0000 mL | Freq: Four times a day (QID) | RESPIRATORY_TRACT | Status: DC
  Administered 2017-01-24 – 2017-01-25 (×4): 2 mL via RESPIRATORY_TRACT
  Administered 2017-01-25: 04:00:00 via RESPIRATORY_TRACT
  Administered 2017-01-25 – 2017-01-27 (×8): 2 mL via RESPIRATORY_TRACT
  Filled 2017-01-24 (×11): qty 4

## 2017-01-24 MED ORDER — DEXTROSE 5 % IV SOLN
2.0000 g | Freq: Three times a day (TID) | INTRAVENOUS | Status: DC
  Administered 2017-01-24 – 2017-01-28 (×13): 2 g via INTRAVENOUS
  Filled 2017-01-24 (×16): qty 2

## 2017-01-24 MED ORDER — ACETYLCYSTEINE 10 % IN SOLN
2.0000 mL | Freq: Four times a day (QID) | RESPIRATORY_TRACT | Status: DC
  Filled 2017-01-24 (×2): qty 2

## 2017-01-24 MED ORDER — LEVALBUTEROL HCL 0.63 MG/3ML IN NEBU
0.6300 mg | INHALATION_SOLUTION | Freq: Four times a day (QID) | RESPIRATORY_TRACT | Status: DC
  Administered 2017-01-24 – 2017-01-27 (×14): 0.63 mg via RESPIRATORY_TRACT
  Filled 2017-01-24 (×14): qty 3

## 2017-01-24 MED ORDER — AMIODARONE HCL 200 MG PO TABS
400.0000 mg | ORAL_TABLET | Freq: Two times a day (BID) | ORAL | Status: DC
  Administered 2017-01-24 – 2017-01-28 (×8): 400 mg via ORAL
  Filled 2017-01-24 (×8): qty 2

## 2017-01-24 MED ORDER — FUROSEMIDE 10 MG/ML IJ SOLN
40.0000 mg | Freq: Once | INTRAMUSCULAR | Status: AC
  Administered 2017-01-24: 40 mg via INTRAVENOUS
  Filled 2017-01-24: qty 4

## 2017-01-24 MED ORDER — AMIODARONE HCL 200 MG PO TABS: 400.0000 mg | ORAL_TABLET | Freq: Every day | ORAL | Status: DC

## 2017-01-24 MED FILL — Lidocaine HCl IV Inj 20 MG/ML: INTRAVENOUS | Qty: 5 | Status: AC

## 2017-01-24 MED FILL — Heparin Sodium (Porcine) Inj 1000 Unit/ML: INTRAMUSCULAR | Qty: 30 | Status: AC

## 2017-01-24 MED FILL — Sodium Bicarbonate IV Soln 8.4%: INTRAVENOUS | Qty: 150 | Status: AC

## 2017-01-24 MED FILL — Heparin Sodium (Porcine) Inj 1000 Unit/ML: INTRAMUSCULAR | Qty: 10 | Status: AC

## 2017-01-24 MED FILL — Mannitol IV Soln 20%: INTRAVENOUS | Qty: 500 | Status: AC

## 2017-01-24 MED FILL — Sodium Chloride IV Soln 0.9%: INTRAVENOUS | Qty: 4000 | Status: AC

## 2017-01-24 MED FILL — Electrolyte-R (PH 7.4) Solution: INTRAVENOUS | Qty: 7000 | Status: AC

## 2017-01-24 NOTE — Progress Notes (Signed)
Patient ID: Victor [name], male   DOB: [birth year], 61 y.o.   MRN: [id number] TCTS DAILY ICU PROGRESS NOTE                   [address]            [place] [address]          [phone]   3 Days Post-Op Procedure(s) (LRB): AORTIC ARCH DEBRANCHING (N/A) Replacement of Aortic Arch with circulatory arrest (N/A) TRANSESOPHAGEAL ECHOCARDIOGRAM (TEE) (N/A) THORACIC AORTIC ENDOVASCULAR STENT GRAFT (N/A) REDO STERNOTOMY (N/A)  Total Length of Stay:  LOS: 3 days   Subjective: Up to chair, alert, neuro intact, frequent pac's  Objective: Vital signs in last 24 hours: Temp:  [98.9 F (37.2 C)-99.5 F (37.5 C)] 99.4 F (37.4 C) (10/01 0700) Pulse Rate:  [76-97] 97 (10/01 0600) Cardiac Rhythm: Normal sinus rhythm (10/01 0800) Resp:  [14-31] 16 (10/01 0600) BP: (108-155)/(64-87) 121/73 (10/01 0600) SpO2:  [93 %-100 %] 96 % (10/01 0600) Weight:  [170 lb 6.7 oz (77.3 kg)] 170 lb 6.7 oz (77.3 kg) (10/01 0500)  Filed Weights   01/22/17 0445 01/23/17 0400 01/24/17 0500  Weight: 186 lb 4.6 oz (84.5 kg) 175 lb 14.8 oz (79.8 kg) 170 lb 6.7 oz (77.3 kg)    Weight change: -5 lb 8.2 oz (-2.5 kg)   Hemodynamic parameters for last 24 hours:    Intake/Output from previous day: 09/30 0701 - 10/01 0700 In: 680 [P.O.:360; I.V.:270; IV Piggyback:50] Out: 1745 [Urine:1625; Chest Tube:120]  Intake/Output this shift: Total I/O In: 20 [I.V.:20] Out: 40 [Urine:40]  Current Meds: Scheduled Meds: . acetaminophen  1,000 mg Oral Q6H   Or  . acetaminophen (TYLENOL) oral liquid 160 mg/5 mL  1,000 mg Per Tube Q6H  . aspirin EC  325 mg Oral Daily   Or  . aspirin  324 mg Per Tube Daily  . bisacodyl  10 mg Oral Daily   Or  . bisacodyl  10 mg Rectal Daily  . docusate sodium  200 mg Oral Daily  . enoxaparin (LOVENOX) injection  30 mg Subcutaneous QHS  . furosemide  40 mg Oral Daily  . losartan  25 mg Oral Daily  . metoprolol tartrate  12.5 mg Oral BID   Or  . metoprolol  tartrate  12.5 mg Per Tube BID  . morphine  15 mg Oral Q12H  . pantoprazole  40 mg Oral Daily  . potassium chloride  20 mEq Oral Daily  . sodium chloride flush  3 mL Intravenous Q12H   Continuous Infusions: . sodium chloride Stopped (01/22/17 1145)  . sodium chloride    . sodium chloride 10 mL/hr (01/23/17 8756)  . lactated ringers    . lactated ringers Stopped (01/22/17 1000)  . lactated ringers 10 mL/hr at 01/24/17 0900  . phenylephrine (NEO-SYNEPHRINE) Adult infusion Stopped (01/22/17 0745)   PRN Meds:.sodium chloride, lactated ringers, metoprolol tartrate, ondansetron (ZOFRAN) IV, oxyCODONE, sodium chloride flush, traMADol  General appearance: alert, cooperative and no distress Neurologic: intact Heart: regular rate and rhythm, S1, S2 normal, no murmur, click, rub or gallop and frequent pac's Lungs: diminished breath sounds bilaterally Abdomen: soft, non-tender; bowel sounds normal; no masses,  no organomegaly Extremities: extremities normal, atraumatic, no cyanosis or edema and Homans sign is negative, no sign of DVT Wound: sternum intact  Lab Results: CBC: Recent Labs  01/23/17 0422 01/24/17 0359  WBC 13.5* 13.3*  HGB 9.8* 9.9*  HCT 29.7* 29.4*  PLT  103* 114*   BMET:  Recent Labs  01/23/17 0422 01/24/17 0359  NA 137 137  K 4.0 4.4  CL 102 105  CO2 25 25  GLUCOSE 109* 83  BUN 18 18  CREATININE 1.32* 1.18  CALCIUM 7.7* 7.8*    CMET: Lab Results  Component Value Date   WBC 13.3 (H) 01/24/2017   HGB 9.9 (L) 01/24/2017   HCT 29.4 (L) 01/24/2017   PLT 114 (L) 01/24/2017   GLUCOSE 83 01/24/2017   CHOL 121 12/23/2016   TRIG 57 12/23/2016   HDL 43 12/23/2016   LDLCALC 67 12/23/2016   ALT 16 (L) 01/19/2017   AST 17 01/19/2017   NA 137 01/24/2017   K 4.4 01/24/2017   CL 105 01/24/2017   CREATININE 1.18 01/24/2017   BUN 18 01/24/2017   CO2 25 01/24/2017   INR 1.09 01/21/2017   HGBA1C 5.8 (H) 01/19/2017      PT/INR:  Recent Labs  01/21/17 2148    LABPROT 14.0  INR 1.09   Radiology: Dg Chest Port 1 View  Result Date: 01/24/2017 CLINICAL DATA:  Status post thoracic aortic aneurysm repair in June of 2016. EXAM: PORTABLE CHEST 1 VIEW COMPARISON:  Portable chest x-ray of January 23, 2017 and January 22, 2017. FINDINGS: The lungs are reasonably well inflated. Increased interstitial density in the left upper and lower lobe is present. There is persistent right basilar density. There is no pneumothorax or significant pleural effusion. The cardiac silhouette is top-normal in size. There is stable prominence of the mediastinum. The aortic stent graft is in stable position. The right internal jugular Cordis sheath tip projects over the proximal portion of the SVC. IMPRESSION: Interval worsening in the appearance of both lungs suggesting pneumonia. Stable prominence of the mediastinum. No overt evidence of pulmonary edema. Electronically Signed   By: David  [place] M.D.   On: 01/24/2017 07:47     Assessment/Plan: S/P Procedure(s) (LRB): AORTIC ARCH DEBRANCHING (N/A) Replacement of Aortic Arch with circulatory arrest (N/A) TRANSESOPHAGEAL ECHOCARDIOGRAM (TEE) (N/A) THORACIC AORTIC ENDOVASCULAR STENT GRAFT (N/A) REDO STERNOTOMY (N/A) Mobilize Diuresis d/c tubes/lines  Interval worsening in the appearance of both lungs suggesting pneumonia- send sputum cultures, increase pulmonary toilet  start Fortaz for presumed pneumonia with low grade temp 99, wbc 13,00 and xray changes     [name] 01/24/2017 9:36 AM

## 2017-01-24 NOTE — Progress Notes (Signed)
CT surgery p.m. Rounds  Patient examined and record reviewed.Hemodynamics stable,labs satisfactory.Patient had stable day.Continue current care. Victor [name] 01/24/2017

## 2017-01-24 NOTE — Progress Notes (Signed)
Subjective  - POD #3  Wants to walk   Physical Exam:  Neurologically intact Palpable bilateral radial pulses Palpable bilateral pedal pulses Abdomen soft    Assessment/Plan:  POD #3  Appears to be doing well at this time. Chest x-ray looks worse today.  Cultures are being sent.  Antibiotics started.  Pulmonary toilet and increased.  Continue with diuresis.  Continue with mobilization  Awais Cobarrubias, [place] 01/24/2017 12:24 PM --  Vitals:   01/24/17 1042 01/24/17 1100  BP:  (!) 145/79  Pulse:  (!) 104  Resp:  19  Temp:  99 F (37.2 C)  SpO2: 99% 94%    Intake/Output Summary (Last 24 hours) at 01/24/17 1224 Last data filed at 01/24/17 1100  Gross per 24 hour  Intake              310 ml  Output             1525 ml  Net            -1215 ml     Laboratory CBC    Component Value Date/Time   WBC 13.3 (H) 01/24/2017 0359   HGB 9.9 (L) 01/24/2017 0359   HCT 29.4 (L) 01/24/2017 0359   PLT 114 (L) 01/24/2017 0359    BMET    Component Value Date/Time   NA 137 01/24/2017 0359   NA 141 12/23/2016 0813   K 4.4 01/24/2017 0359   CL 105 01/24/2017 0359   CO2 25 01/24/2017 0359   GLUCOSE 83 01/24/2017 0359   BUN 18 01/24/2017 0359   BUN 15 12/23/2016 0813   CREATININE 1.18 01/24/2017 0359   CREATININE 1.14 02/03/2015 0936   CALCIUM 7.8 (L) 01/24/2017 0359   GFRNONAA >60 01/24/2017 0359   GFRAA >60 01/24/2017 0359    COAG Lab Results  Component Value Date   INR 1.09 01/21/2017   INR 1.16 01/21/2017   INR 1.36 01/21/2017   No results found for: PTT  Antibiotics Anti-infectives    Start     Dose/Rate Route Frequency Ordered Stop   01/24/17 1100  cefTAZidime (FORTAZ) 2 g in dextrose 5 % 50 mL IVPB     2 g 100 mL/hr over 30 Minutes Intravenous Every 8 hours 01/24/17 1006     01/22/17 0115  vancomycin (VANCOCIN) IVPB 1000 mg/200 mL premix     1,000 mg 200 mL/hr over 60 Minutes Intravenous  Once 01/21/17 2138 01/22/17 0225   01/21/17 2200  cefUROXime  (ZINACEF) 1.5 g in dextrose 5 % 50 mL IVPB     1.5 g 100 mL/hr over 30 Minutes Intravenous Every 12 hours 01/21/17 2138 01/23/17 0958   01/21/17 0400  vancomycin (VANCOCIN) 1,250 mg in sodium chloride 0.9 % 250 mL IVPB     1,250 mg 166.7 mL/hr over 90 Minutes Intravenous To Surgery 01/20/17 0834 01/21/17 0710   01/21/17 0400  cefUROXime (ZINACEF) 1.5 g in dextrose 5 % 50 mL IVPB     1.5 g 100 mL/hr over 30 Minutes Intravenous To Surgery 01/20/17 0834 01/21/17 1914   01/21/17 0400  cefUROXime (ZINACEF) 750 mg in dextrose 5 % 50 mL IVPB  Status:  Discontinued     750 mg 100 mL/hr over 30 Minutes Intravenous To Surgery 01/20/17 1142 01/21/17 2138   01/21/17 0400  cefUROXime (ZINACEF) 1.5 g in dextrose 5 % 50 mL IVPB  Status:  Discontinued     1.5 g 100 mL/hr over 30 Minutes Intravenous 30 min pre-op  01/20/17 0835 01/21/17 2138       V. Charlena Cross, M.D. [facility] Office: [phone] Pager:  [phone]

## 2017-01-24 NOTE — Progress Notes (Signed)
Encouraged pt to use heart pillow to apply pressure to chest and deep cough post mucomyst tx to obtain sputum sample. Pt with very weak cough and unable to bring sample up. Rt will get a flutter valve and instruct pt on use to aid in sputum clearance

## 2017-01-24 NOTE — Progress Notes (Signed)
Case reviewed, d/w Dr. Tyrone Sage. Will help arrange follow-up appointment for next week. Seems to be stable but still needs titration of pain meds. Diuresing on lasix. Started on losartan, BP controlled today. No further suggestions.  Chrystie Nose, MD, [facility]  [place]  [facility]  Attending Cardiologist  Direct Dial: [phone]  Fax: [phone]  Website:  [email or web address]

## 2017-01-25 ENCOUNTER — Inpatient Hospital Stay (HOSPITAL_COMMUNITY): Payer: Medicaid Other

## 2017-01-25 DIAGNOSIS — I712 Thoracic aortic aneurysm, without rupture: Principal | ICD-10-CM

## 2017-01-25 LAB — HEPATIC FUNCTION PANEL
ALT: 23 U/L (ref 17–63)
AST: 23 U/L (ref 15–41)
Albumin: 2.9 g/dL — ABNORMAL LOW (ref 3.5–5.0)
Alkaline Phosphatase: 62 U/L (ref 38–126)
Bilirubin, Direct: 1.1 mg/dL — ABNORMAL HIGH (ref 0.1–0.5)
Indirect Bilirubin: 1.8 mg/dL — ABNORMAL HIGH (ref 0.3–0.9)
Total Bilirubin: 2.9 mg/dL — ABNORMAL HIGH (ref 0.3–1.2)
Total Protein: 5.5 g/dL — ABNORMAL LOW (ref 6.5–8.1)

## 2017-01-25 LAB — TYPE AND SCREEN
ABO/RH(D): O POS
Antibody Screen: NEGATIVE
Unit division: 0
Unit division: 0
Unit division: 0
Unit division: 0
Unit division: 0
Unit division: 0
Unit division: 0
Unit division: 0

## 2017-01-25 LAB — BASIC METABOLIC PANEL
Anion gap: 8 (ref 5–15)
BUN: 19 mg/dL (ref 6–20)
CO2: 27 mmol/L (ref 22–32)
Calcium: 8 mg/dL — ABNORMAL LOW (ref 8.9–10.3)
Chloride: 101 mmol/L (ref 101–111)
Creatinine, Ser: 1.11 mg/dL (ref 0.61–1.24)
GFR calc Af Amer: >60 mL/min (ref >60)
GFR calc non Af Amer: >60 mL/min (ref >60)
Glucose, Bld: 93 mg/dL (ref 65–99)
Potassium: 3.7 mmol/L (ref 3.5–5.1)
Sodium: 136 mmol/L (ref 135–145)

## 2017-01-25 LAB — BPAM RBC
Blood Product Expiration Date: 201810162359
Blood Product Expiration Date: 201810182359
Blood Product Expiration Date: 201810182359
Blood Product Expiration Date: 201810182359
Blood Product Expiration Date: 201810182359
Blood Product Expiration Date: 201810182359
Blood Product Expiration Date: 201810182359
Blood Product Expiration Date: 201810182359
ISSUE DATE / TIME: 201809280821
ISSUE DATE / TIME: 201809280821
ISSUE DATE / TIME: 201810010925
ISSUE DATE / TIME: 201810011022
ISSUE DATE / TIME: 201810011045
ISSUE DATE / TIME: 201810011148
ISSUE DATE / TIME: 201810011312
ISSUE DATE / TIME: 201810011450
Unit Type and Rh: 5100
Unit Type and Rh: 5100
Unit Type and Rh: 5100
Unit Type and Rh: 5100
Unit Type and Rh: 5100
Unit Type and Rh: 5100
Unit Type and Rh: 5100
Unit Type and Rh: 5100

## 2017-01-25 LAB — TSH: TSH: 1.456 u[IU]/mL (ref 0.350–4.500)

## 2017-01-25 LAB — CBC
HCT: 30.5 % — ABNORMAL LOW (ref 39.0–52.0)
Hemoglobin: 10 g/dL — ABNORMAL LOW (ref 13.0–17.0)
MCH: 27.6 pg (ref 26.0–34.0)
MCHC: 32.8 g/dL (ref 30.0–36.0)
MCV: 84.3 fL (ref 78.0–100.0)
Platelets: 134 10*3/uL — ABNORMAL LOW (ref 150–400)
RBC: 3.62 MIL/uL — ABNORMAL LOW (ref 4.22–5.81)
RDW: 14.5 % (ref 11.5–15.5)
WBC: 14.3 10*3/uL — ABNORMAL HIGH (ref 4.0–10.5)

## 2017-01-25 LAB — MAGNESIUM: Magnesium: 2 mg/dL (ref 1.7–2.4)

## 2017-01-25 MED ORDER — ASPIRIN EC 81 MG PO TBEC
81.0000 mg | DELAYED_RELEASE_TABLET | Freq: Every day | ORAL | Status: DC
  Administered 2017-01-26 – 2017-01-28 (×2): 81 mg via ORAL
  Filled 2017-01-25 (×3): qty 1

## 2017-01-25 MED ORDER — ASPIRIN 81 MG PO CHEW
81.0000 mg | CHEWABLE_TABLET | Freq: Every day | ORAL | Status: DC
  Administered 2017-01-27: 81 mg
  Filled 2017-01-25: qty 1

## 2017-01-25 MED ORDER — POTASSIUM CHLORIDE CRYS ER 20 MEQ PO TBCR
20.0000 meq | EXTENDED_RELEASE_TABLET | ORAL | Status: DC | PRN
  Administered 2017-01-25 – 2017-01-26 (×2): 20 meq via ORAL
  Filled 2017-01-25: qty 1

## 2017-01-25 NOTE — Progress Notes (Signed)
      [address]       [place] [address]  [id number]         [phone]      POD # 4 aortic arch debranching  Up in chair  BP 97/60   Pulse 79   Temp 98.1 F (36.7 C) (Oral)   Resp 11   Ht (P) 5' 6.93" (1.7 m)   Wt 165 lb 9.6 oz (75.1 kg)   SpO2 99%   BMI (P) 25.99 kg/m    Intake/Output Summary (Last 24 hours) at 01/25/17 1914 Last data filed at 01/25/17 1700  Gross per 24 hour  Intake              770 ml  Output              350 ml  Net              420 ml   No PM labs  Viviann Spare C. Dorris Fetch, MD [facility] [phone]

## 2017-01-25 NOTE — Plan of Care (Signed)
Problem: Activity: Goal: Risk for activity intolerance will decrease Outcome: Progressing Pt is ambulating multiple times a day on unit with no difficulty.  Problem: Bowel/Gastric: Goal: Gastrointestinal status for postoperative course will improve Outcome: Not Progressing Pt has active bowel sounds and has passed gas, but has been unable to have a bowel movement yet.   Problem: Pain Management: Goal: Pain level will decrease Outcome: Progressing Pts pain is controlled with prescribed pain medications.

## 2017-01-25 NOTE — Progress Notes (Signed)
Subjective  - POD #4  Already walked this am. Tolerating diet, no stool   Physical Exam:  Palpable pedal and radial pulses abd soft Neuro intact    CXR:  Pending   Assessment/Plan:  POD #4  Renal:  Cr stable at 1.1.  Good UOP yesterday 1.9L ID:  On IV abx for possible pneumonia.  CXR pending for this am HTN:  BP stable.  On ARB. Pulm:  Still on o2, continue pulm toilet    Brabham, [place] 01/25/2017 7:37 AM --  Vitals:   01/25/17 0500 01/25/17 0600  BP: 115/72 115/61  Pulse: 92 83  Resp: 16 16  Temp:    SpO2: 99% 99%    Intake/Output Summary (Last 24 hours) at 01/25/17 0737 Last data filed at 01/25/17 0403  Gross per 24 hour  Intake               90 ml  Output             1960 ml  Net            -1870 ml     Laboratory CBC    Component Value Date/Time   WBC 14.3 (H) 01/25/2017 0318   HGB 10.0 (L) 01/25/2017 0318   HCT 30.5 (L) 01/25/2017 0318   PLT 134 (L) 01/25/2017 0318    BMET    Component Value Date/Time   NA 136 01/25/2017 0318   NA 141 12/23/2016 0813   K 3.7 01/25/2017 0318   CL 101 01/25/2017 0318   CO2 27 01/25/2017 0318   GLUCOSE 93 01/25/2017 0318   BUN 19 01/25/2017 0318   BUN 15 12/23/2016 0813   CREATININE 1.11 01/25/2017 0318   CREATININE 1.14 02/03/2015 0936   CALCIUM 8.0 (L) 01/25/2017 0318   GFRNONAA >60 01/25/2017 0318   GFRAA >60 01/25/2017 0318    COAG Lab Results  Component Value Date   INR 1.09 01/21/2017   INR 1.16 01/21/2017   INR 1.36 01/21/2017   No results found for: PTT  Antibiotics Anti-infectives    Start     Dose/Rate Route Frequency Ordered Stop   01/24/17 1100  cefTAZidime (FORTAZ) 2 g in dextrose 5 % 50 mL IVPB     2 g 100 mL/hr over 30 Minutes Intravenous Every 8 hours 01/24/17 1006     01/22/17 0115  vancomycin (VANCOCIN) IVPB 1000 mg/200 mL premix     1,000 mg 200 mL/hr over 60 Minutes Intravenous  Once 01/21/17 2138 01/22/17 0225   01/21/17 2200  cefUROXime (ZINACEF) 1.5 g in  dextrose 5 % 50 mL IVPB     1.5 g 100 mL/hr over 30 Minutes Intravenous Every 12 hours 01/21/17 2138 01/23/17 0958   01/21/17 0400  vancomycin (VANCOCIN) 1,250 mg in sodium chloride 0.9 % 250 mL IVPB     1,250 mg 166.7 mL/hr over 90 Minutes Intravenous To Surgery 01/20/17 0834 01/21/17 0710   01/21/17 0400  cefUROXime (ZINACEF) 1.5 g in dextrose 5 % 50 mL IVPB     1.5 g 100 mL/hr over 30 Minutes Intravenous To Surgery 01/20/17 0834 01/21/17 1914   01/21/17 0400  cefUROXime (ZINACEF) 750 mg in dextrose 5 % 50 mL IVPB  Status:  Discontinued     750 mg 100 mL/hr over 30 Minutes Intravenous To Surgery 01/20/17 1142 01/21/17 2138   01/21/17 0400  cefUROXime (ZINACEF) 1.5 g in dextrose 5 % 50 mL IVPB  Status:  Discontinued  1.5 g 100 mL/hr over 30 Minutes Intravenous 30 min pre-op 01/20/17 0835 01/21/17 2138       V. Charlena Cross, M.D. [facility] Office: [phone] Pager:  [phone]

## 2017-01-25 NOTE — Progress Notes (Signed)
Patient ID: Victor [name], male   DOB: 12-21-1955, 61 y.o.   MRN: [id number] TCTS DAILY ICU PROGRESS NOTE                   [address]            [place] [address]          [phone]   4 Days Post-Op Procedure(s) (LRB): AORTIC ARCH DEBRANCHING (N/A) Replacement of Aortic Arch with circulatory arrest (N/A) TRANSESOPHAGEAL ECHOCARDIOGRAM (TEE) (N/A) THORACIC AORTIC ENDOVASCULAR STENT GRAFT (N/A) REDO STERNOTOMY (N/A)  Total Length of Stay:  LOS: 4 days   Subjective: Up in chair, walked today   Objective: Vital signs in last 24 hours: Temp:  [98.3 F (36.8 C)-99.2 F (37.3 C)] 98.5 F (36.9 C) (10/02 1100) Pulse Rate:  [78-104] 99 (10/02 0800) Cardiac Rhythm: Normal sinus rhythm;Sinus tachycardia (10/02 0800) Resp:  [13-24] 20 (10/02 0800) BP: (113-143)/(61-79) 130/76 (10/02 0800) SpO2:  [92 %-99 %] 95 % (10/02 0855) Weight:  [165 lb 9.6 oz (75.1 kg)] 165 lb 9.6 oz (75.1 kg) (10/02 0345)  Filed Weights   01/23/17 0400 01/24/17 0500 01/25/17 0345  Weight: 175 lb 14.8 oz (79.8 kg) 170 lb 6.7 oz (77.3 kg) 165 lb 9.6 oz (75.1 kg)    Weight change: -4 lb 13.1 oz (-2.184 kg)   Hemodynamic parameters for last 24 hours:    Intake/Output from previous day: 10/01 0701 - 10/02 0700 In: 90 [I.V.:40; IV Piggyback:50] Out: 1960 [Urine:1960]  Intake/Output this shift: No intake/output data recorded.  Current Meds: Scheduled Meds: . acetaminophen  1,000 mg Oral Q6H   Or  . acetaminophen (TYLENOL) oral liquid 160 mg/5 mL  1,000 mg Per Tube Q6H  . acetylcysteine  2 mL Nebulization Q6H WA  . amiodarone  400 mg Oral Q12H   Followed by  . [START ON 02/01/2017] amiodarone  400 mg Oral Daily  . aspirin EC  325 mg Oral Daily   Or  . aspirin  324 mg Per Tube Daily  . bisacodyl  10 mg Oral Daily   Or  . bisacodyl  10 mg Rectal Daily  . docusate sodium  200 mg Oral Daily  . enoxaparin (LOVENOX) injection  30 mg Subcutaneous QHS  . furosemide  40 mg Oral  Daily  . levalbuterol  0.63 mg Nebulization Q6H  . losartan  25 mg Oral Daily  . metoprolol tartrate  12.5 mg Oral BID   Or  . metoprolol tartrate  12.5 mg Per Tube BID  . pantoprazole  40 mg Oral Daily  . potassium chloride  20 mEq Oral Daily  . sodium chloride flush  3 mL Intravenous Q12H   Continuous Infusions: . sodium chloride Stopped (01/22/17 1145)  . sodium chloride    . sodium chloride 10 mL/hr (01/23/17 2130)  . cefTAZidime (FORTAZ)  IV Stopped (01/25/17 1340)  . lactated ringers    . lactated ringers Stopped (01/22/17 1000)  . lactated ringers 10 mL/hr at 01/24/17 1100  . phenylephrine (NEO-SYNEPHRINE) Adult infusion Stopped (01/22/17 0745)   PRN Meds:.sodium chloride, lactated ringers, metoprolol tartrate, ondansetron (ZOFRAN) IV, oxyCODONE, potassium chloride, sodium chloride flush, traMADol  General appearance: alert and cooperative Neurologic: intact Heart: regular rate and rhythm, S1, S2 normal, no murmur, click, rub or gallop Lungs: diminished breath sounds bibasilar Abdomen: soft, non-tender; bowel sounds normal; no masses,  no organomegaly Extremities: extremities normal, atraumatic, no cyanosis or edema and Homans sign is negative, no sign  of DVT Wound: intact  Lab Results: CBC: Recent Labs  01/24/17 0359 01/25/17 0318  WBC 13.3* 14.3*  HGB 9.9* 10.0*  HCT 29.4* 30.5*  PLT 114* 134*   BMET:  Recent Labs  01/24/17 0359 01/25/17 0318  NA 137 136  K 4.4 3.7  CL 105 101  CO2 25 27  GLUCOSE 83 93  BUN 18 19  CREATININE 1.18 1.11  CALCIUM 7.8* 8.0*    CMET: Lab Results  Component Value Date   WBC 14.3 (H) 01/25/2017   HGB 10.0 (L) 01/25/2017   HCT 30.5 (L) 01/25/2017   PLT 134 (L) 01/25/2017   GLUCOSE 93 01/25/2017   CHOL 121 12/23/2016   TRIG 57 12/23/2016   HDL 43 12/23/2016   LDLCALC 67 12/23/2016   ALT 23 01/25/2017   AST 23 01/25/2017   NA 136 01/25/2017   K 3.7 01/25/2017   CL 101 01/25/2017   CREATININE 1.11 01/25/2017    BUN 19 01/25/2017   CO2 27 01/25/2017   TSH 1.456 01/25/2017   INR 1.09 01/21/2017   HGBA1C 5.8 (H) 01/19/2017     Results for orders placed or performed during the hospital encounter of 01/21/17  Culture, expectorated sputum-assessment     Status: None   Collection Time: 01/24/17 11:26 AM  Result Value Ref Range Status   Specimen Description SPUTUM  Final   Special Requests NONE  Final   Sputum evaluation   Final    Sputum specimen not acceptable for testing.  Please recollect.   Results Called to: Hiram Gash, RN AT [phone] ON 01/24/17 BY C. JESSUP, MLT.    Report Status 01/24/2017 FINAL  Final        PT/INR: No results for input(s): LABPROT, INR in the last 72 hours. Radiology: Dg Chest Port 1 View  Result Date: 01/25/2017 CLINICAL DATA:  Status post thoracic aortic aneurysm repair and stent graft placement on January 21, 2017. EXAM: PORTABLE CHEST 1 VIEW COMPARISON:  Portable chest x-ray of January 24, 2017 FINDINGS: The lungs are adequately inflated. There are alveolar opacities in the right mid lung and in the left upper lobe and left lower lobe compatible with pneumonia. The cardiac silhouette remains enlarged. The mediastinum remains widened. The stent graft is in stable appearing position. The pulmonary vascularity is not engorged. There is no significant pleural effusion and no pneumothorax. The right internal jugular Cordis sheath has been removed. IMPRESSION: Fairly stable appearance of the chest allowing for differences in patient positioning. Bilateral infiltrates compatible with pneumonia. Stable mediastinal widening and mild cardial back enlargement without evidence of pulmonary edema. Electronically Signed   By: David  [place] M.D.   On: 01/25/2017 07:52     Assessment/Plan: S/P Procedure(s) (LRB): AORTIC ARCH DEBRANCHING (N/A) Replacement of Aortic Arch with circulatory arrest (N/A) TRANSESOPHAGEAL ECHOCARDIOGRAM (TEE) (N/A) THORACIC AORTIC ENDOVASCULAR STENT GRAFT  (N/A) REDO STERNOTOMY (N/A) Mobilize Diuresis Still waiting for sputum culture collection  But treating for hcap     [name] 01/25/2017 2:40 PM

## 2017-01-26 ENCOUNTER — Inpatient Hospital Stay (HOSPITAL_COMMUNITY): Payer: Medicaid Other

## 2017-01-26 LAB — CBC
HCT: 31 % — ABNORMAL LOW (ref 39.0–52.0)
Hemoglobin: 10 g/dL — ABNORMAL LOW (ref 13.0–17.0)
MCH: 27.6 pg (ref 26.0–34.0)
MCHC: 32.3 g/dL (ref 30.0–36.0)
MCV: 85.6 fL (ref 78.0–100.0)
Platelets: 185 10*3/uL (ref 150–400)
RBC: 3.62 MIL/uL — ABNORMAL LOW (ref 4.22–5.81)
RDW: 15 % (ref 11.5–15.5)
WBC: 13.2 10*3/uL — ABNORMAL HIGH (ref 4.0–10.5)

## 2017-01-26 LAB — BASIC METABOLIC PANEL
Anion gap: 12 (ref 5–15)
BUN: 22 mg/dL — ABNORMAL HIGH (ref 6–20)
CO2: 25 mmol/L (ref 22–32)
Calcium: 8.3 mg/dL — ABNORMAL LOW (ref 8.9–10.3)
Chloride: 101 mmol/L (ref 101–111)
Creatinine, Ser: 1.15 mg/dL (ref 0.61–1.24)
GFR calc Af Amer: >60 mL/min (ref >60)
GFR calc non Af Amer: >60 mL/min (ref >60)
Glucose, Bld: 91 mg/dL (ref 65–99)
Potassium: 4 mmol/L (ref 3.5–5.1)
Sodium: 138 mmol/L (ref 135–145)

## 2017-01-26 MED ORDER — BISACODYL 5 MG PO TBEC: 10.0000 mg | DELAYED_RELEASE_TABLET | Freq: Every day | ORAL | Status: DC | PRN

## 2017-01-26 MED ORDER — BISACODYL 10 MG RE SUPP: 10.0000 mg | Freq: Every day | RECTAL | Status: DC | PRN

## 2017-01-26 MED ORDER — SODIUM CHLORIDE 0.9 % IV SOLN: 250.0000 mL | INTRAVENOUS | Status: DC | PRN

## 2017-01-26 MED FILL — Potassium Chloride Inj 2 mEq/ML: INTRAVENOUS | Qty: 40 | Status: AC

## 2017-01-26 MED FILL — Heparin Sodium (Porcine) Inj 1000 Unit/ML: INTRAMUSCULAR | Qty: 30 | Status: AC

## 2017-01-26 MED FILL — Dexmedetomidine HCl in NaCl 0.9% IV Soln 400 MCG/100ML: INTRAVENOUS | Qty: 100 | Status: AC

## 2017-01-26 MED FILL — Magnesium Sulfate Inj 50%: INTRAMUSCULAR | Qty: 10 | Status: AC

## 2017-01-26 NOTE — Progress Notes (Signed)
Patient ID: Victor [name], male   DOB: July 13, 1955, 61 y.o.   MRN: [id number] TCTS DAILY ICU PROGRESS NOTE                   [address]            [place] [address]          [phone]   5 Days Post-Op Procedure(s) (LRB): AORTIC ARCH DEBRANCHING (N/A) Replacement of Aortic Arch with circulatory arrest (N/A) TRANSESOPHAGEAL ECHOCARDIOGRAM (TEE) (N/A) THORACIC AORTIC ENDOVASCULAR STENT GRAFT (N/A) REDO STERNOTOMY (N/A)  Total Length of Stay:  LOS: 5 days   Subjective: Walked this am, feels better  Objective: Vital signs in last 24 hours: Temp:  [97.6 F (36.4 C)-98.5 F (36.9 C)] 98 F (36.7 C) (10/03 0823) Pulse Rate:  [74-101] 98 (10/03 0807) Cardiac Rhythm: Normal sinus rhythm (10/02 2000) Resp:  [10-26] 18 (10/03 0807) BP: (97-125)/(52-92) 107/91 (10/03 0700) SpO2:  [89 %-100 %] 98 % (10/03 0814) FiO2 (%):  [32 %] 32 % (10/03 0814) Weight:  [164 lb 14.5 oz (74.8 kg)] 164 lb 14.5 oz (74.8 kg) (10/03 0500)  Filed Weights   01/24/17 0500 01/25/17 0345 01/26/17 0500  Weight: 170 lb 6.7 oz (77.3 kg) 165 lb 9.6 oz (75.1 kg) 164 lb 14.5 oz (74.8 kg)    Weight change: -11.1 oz (-0.316 kg)   Hemodynamic parameters for last 24 hours:    Intake/Output from previous day: 10/02 0701 - 10/03 0700 In: 890 [P.O.:840; IV Piggyback:50] Out: 225 [Urine:225]  Intake/Output this shift: No intake/output data recorded.  Current Meds: Scheduled Meds: . acetaminophen  1,000 mg Oral Q6H   Or  . acetaminophen (TYLENOL) oral liquid 160 mg/5 mL  1,000 mg Per Tube Q6H  . acetylcysteine  2 mL Nebulization Q6H WA  . amiodarone  400 mg Oral Q12H   Followed by  . [START ON 02/01/2017] amiodarone  400 mg Oral Daily  . aspirin EC  81 mg Oral Daily   Or  . aspirin  81 mg Per Tube Daily  . bisacodyl  10 mg Oral Daily   Or  . bisacodyl  10 mg Rectal Daily  . docusate sodium  200 mg Oral Daily  . enoxaparin (LOVENOX) injection  30 mg Subcutaneous QHS  . furosemide   40 mg Oral Daily  . levalbuterol  0.63 mg Nebulization Q6H  . losartan  25 mg Oral Daily  . metoprolol tartrate  12.5 mg Oral BID   Or  . metoprolol tartrate  12.5 mg Per Tube BID  . pantoprazole  40 mg Oral Daily  . potassium chloride  20 mEq Oral Daily  . sodium chloride flush  3 mL Intravenous Q12H   Continuous Infusions: . sodium chloride Stopped (01/22/17 1145)  . sodium chloride    . sodium chloride 10 mL/hr (01/23/17 2841)  . cefTAZidime (FORTAZ)  IV Stopped (01/26/17 0511)  . lactated ringers    . lactated ringers Stopped (01/22/17 1000)  . lactated ringers 10 mL/hr at 01/24/17 1100  . phenylephrine (NEO-SYNEPHRINE) Adult infusion Stopped (01/22/17 0745)   PRN Meds:.sodium chloride, lactated ringers, metoprolol tartrate, ondansetron (ZOFRAN) IV, oxyCODONE, potassium chloride, sodium chloride flush, traMADol  General appearance: alert and no distress Neurologic: intact Heart: regular rate and rhythm, S1, S2 normal, no murmur, click, rub or gallop Lungs: diminished breath sounds bibasilar Abdomen: soft, non-tender; bowel sounds normal; no masses,  no organomegaly Extremities: extremities normal, atraumatic, no cyanosis or edema and  Homans sign is negative, no sign of DVT Wound: intact   Lab Results: CBC: Recent Labs  01/25/17 0318 01/26/17 0749  WBC 14.3* 13.2*  HGB 10.0* 10.0*  HCT 30.5* 31.0*  PLT 134* 185   BMET:  Recent Labs  01/25/17 0318 01/26/17 0749  NA 136 138  K 3.7 4.0  CL 101 101  CO2 27 25  GLUCOSE 93 91  BUN 19 22*  CREATININE 1.11 1.15  CALCIUM 8.0* 8.3*    CMET: Lab Results  Component Value Date   WBC 13.2 (H) 01/26/2017   HGB 10.0 (L) 01/26/2017   HCT 31.0 (L) 01/26/2017   PLT 185 01/26/2017   GLUCOSE 91 01/26/2017   CHOL 121 12/23/2016   TRIG 57 12/23/2016   HDL 43 12/23/2016   LDLCALC 67 12/23/2016   ALT 23 01/25/2017   AST 23 01/25/2017   NA 138 01/26/2017   K 4.0 01/26/2017   CL 101 01/26/2017   CREATININE 1.15  01/26/2017   BUN 22 (H) 01/26/2017   CO2 25 01/26/2017   TSH 1.456 01/25/2017   INR 1.09 01/21/2017   HGBA1C 5.8 (H) 01/19/2017      PT/INR: No results for input(s): LABPROT, INR in the last 72 hours. Radiology: Dg Chest Port 1 View  Result Date: 01/26/2017 CLINICAL DATA:  Infiltrate EXAM: PORTABLE CHEST 1 VIEW COMPARISON:  January 25, 2017 FINDINGS: There is slightly less airspace consolidation in the left upper lobe compared to 1 day prior. Patchy infiltrate throughout the left upper lobe does remain. There has been partial clearing of infiltrate from the right mid lung with patchy opacity remaining in this area. There is a small right pleural effusion with right base atelectasis. No new opacity is evident on either side. There is no demonstrable pneumothorax. Heart is mildly enlarged with pulmonary vascular within normal limits. There is an aortic stent graft. Prominence in the aortic arch region and descending aorta remains. No bone lesions. IMPRESSION: Overall less airspace consolidation in the left upper lobe and right mid lung regions compared to 1 day prior. Patchy infiltrate does remain in these areas currently. There is a small right pleural effusion with right base atelectasis. No new opacity identified on either side. The cardiac silhouette is stable. Prominence of the thoracic aorta remains with stent graft in place. Appearance in these areas remain stable. No evident adenopathy. Electronically Signed   By: Bretta Bang III M.D.   On: 01/26/2017 07:20     Assessment/Plan: S/P Procedure(s) (LRB): AORTIC ARCH DEBRANCHING (N/A) Replacement of Aortic Arch with circulatory arrest (N/A) TRANSESOPHAGEAL ECHOCARDIOGRAM (TEE) (N/A) THORACIC AORTIC ENDOVASCULAR STENT GRAFT (N/A) REDO STERNOTOMY (N/A) Mobilize Diuresis Plan for transfer to step-down: see transfer orders Chest xray improved  Renal function stable     [name] 01/26/2017 8:50 AM

## 2017-01-26 NOTE — Progress Notes (Signed)
Patient transferred from [facility] s/p Aortic aneurysm repair. Alert and oriented. Oriented to room and surroundings. CHG bath done.

## 2017-01-26 NOTE — Care Management Note (Signed)
Case Management Note  Patient Details  Name: Victor [name] MRN: [id number] Date of Birth: [birth year]  Subjective/Objective:   Pt is s/p   AORTIC ARCH DEBRANCHING (N/A) Replacement of Aortic Arch with circulatory arrest (N/A) TRANSESOPHAGEAL ECHOCARDIOGRAM (TEE) (N/A) THORACIC AORTIC ENDOVASCULAR STENT GRAFT (N/A) REDO STERNOTOMY (N/A)              Action/Plan:  PTA independent from home with mom.  Mom remains independent and will be with pt at discharge, she will transport pt home at discharge.  Pt states he has a walker in the home if needed at discharge.  Pt will need clinic appt at discharge and possible MATCH letter.  CM will continue to monitor discharge   Expected Discharge Date:                  Expected Discharge Plan:  Home/Self Care  In-House Referral:     Discharge planning Services  CM Consult  Post Acute Care Choice:    Choice offered to:     DME Arranged:    DME Agency:     HH Arranged:    HH Agency:     Status of Service:     If discussed at [employer] of Stay Meetings, dates discussed:    Additional Comments:  Cherylann Parr, RN 01/26/2017, 10:15 AM

## 2017-01-26 NOTE — Progress Notes (Signed)
Patient ID: Victor [name], male   DOB: 01/12/1956, 61 y.o.   MRN: [id number] TCTS Evening Rounds:  Hemodynamically stable  sats 97%  Awaiting bed on 4E.

## 2017-01-26 NOTE — Progress Notes (Signed)
Pt transferred to 4E02 by SWOT RN uneventfully with belongings and VSS.    Marlou Porch

## 2017-01-26 NOTE — Op Note (Signed)
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  PROCEDURE PERFORMED: 1. Redo sternotomy. 2. Cardiopulmonary bypass with hypothermic arrest and antegrade     cerebral perfusion. 3. Replacement of aortic arch with elephant trunk into the proximal     descending thoracic aorta with a 30 x 10 x 8 x 8 x 10 Hemashield     graft with 10 mm arm to the innominate, 8 mm arm to the left     carotid, and 8 mm arm to the left subclavian.     4. TEVAR placement antegrade withGORE CTAG 34x10, then a GOE CTAG 37x15, followed by a GORE CTAG 40x15    5. IVUS of the aortic arch and descending thoracic aorta, and upper abdominal aorta, including visceral vessels (celiac, SMA, bilateral renal arteries) 6. Thoracic and abdominal aortogram   CO-SURGEONS: 1. Dr. Tyrone Sage, Cardiac Surgery. 2. Dr. Myra Gianotti, Vascular Surgery.  BRIEF HISTORY:  The patient is a 61 year old male who approximately 4 years previously presented with acute type 1 aortic dissection.  At that time, he underwent resuspension of his aortic valve and replacement of the ascending aorta to the innominate with a straight 30 mm tube graft. Postoperatively, he had left leg ischemia and had a right femoral to left femoral bypass over several years.  He recovered well from his surgery and was followed with serial CT scans of the chest.  He noted that there was a persistent false lumen involving the aortic arch and progressive enlargement to 5.7 cm in the proximal  descending aorta just distal to the left subclavian artery.  He has a complex dissection involving the abdominal vessels in addition.  With the progressive enlargement of the proximal descending aorta and distal arch, it was recommended to him that we proceed with redo sternotomy, replacement of the aortic arch and antegrade placement of stent with elephant trunk and antegrade placement of stent graft into the descending thoracic aorta. The patient agreed and signed informed consent.  DESCRIPTION OF PROCEDURE:  With bilateral radial arterial lines in place, the patient underwent general endotracheal anesthesia, Swan-Ganz catheter was in place.  The neck, chest, abdomen, and legs were prepped with Betadine and draped in sterile manner.  Appropriate time-out was performed.  We started with opening the right infraclavicular area along the deltopectoral groove and dissecting down through the old incision that was used for cannulation at the first surgery.  The axillary artery was identified and encircled with vessel loops for cannulation.  We then performed redo sternotomy, taking care not to injure any underlying structures.  The dissection was carried down in tedious manner, freeing up the right atrium and the ascending aorta.  With adequate dissection to place the patient  on bypass, he was then systemically heparinized and 8 mm CannulaGraft was selected and used for cannulation of the right axillary artery.  The axillary artery was clamped proximally and distally.  The vessel was opened.  It was intact without evidence of dissection.  Using a running 5-0 Prolene, CannulaGraft was sewed to the axillary artery.  It was allowed to backbleed and de-air and attached as arterial inflow.  A retrograde cardioplegia catheter was placed and position confirmed.  Under echo guidance, the dual stage venous cannula was placed in the aorta.  The TEE probe showed very trace aortic insufficiency.   Preoperatively, the cardiac catheterization had showed no significant coronary artery disease.  As we dissected out the old graft, it appeared intact.  The patient was then placed on cardiopulmonary bypass 2.4 L/min/m2.  Etomidate and steroids were given. This monitoring was in place.  We began at this point cooling the patient to 18 degrees.  As we approached maximum cooling, we then commenced circulatory arrest.  A clamp was placed on the innominate artery and antegrade cerebral perfusion was begun and carried out as we reconstructed the aortic arch.  The aorta was divided at the previous distal anastomosis.  At this point, the aorta was opened and takeoff the left carotid and left subclavian arteries were identified.  They had previously been encircled with vessel loops.  Each was divided.  The aorta was transected between the takeoff the left carotid and the subclavian artery.  The stump of the subclavian artery was oversewn. Looking into the descending aorta just distal to the takeoff the subclavian artery, there was a large fenestration with a false lumen evident.  We used a section of 30 mm graft and constructed elephant trunk of at least 8 to 10 cm down the distal aorta.  Using a Felt strip and with portion of the graft involuted, the distal aortic anastomosis was performed with a running 3-0 Prolene suture.  We then continued anastomosing an 8 mm arm of the graft to the left subclavian with running 6-0.  In similar fashion, an 8 mm arm of the graft to the left carotid and 10 mm arm to the innominate.  Distally, this graft was anastomosed to the elephant trunk graft.  At this point throughout this antegrade perfusion, there was good return of blood both from the subclavian and the left carotid artery.  With the arch portion of the aorta reconstructed, we then de-aired the graft completely and removed the clamps on the subclavian, left carotid and innominate.  Clamp was placed on  the graft and antegrade and full flow was resumed intermittently.  Cold blood cardioplegia had been administered retrograde.  We then trimmed the graft to the appropriate length and anastomosed the distal end of the original ascending aortic replacement to the newly reconstructed arch.  With this completed, the heart was allowed to passively fill in and further de-airing maneuvers were carried out.  The aortic cross-clamp was then removed.  The patient's body temperature was rewarmed to 37 degrees.  He spontaneously converted to a sinus rhythm.  Atrial and ventricular pacing wires were applied. While still on bypass and as we rewarmed with the details dictated by Dr. Myra Gianotti, we then proceeded under fluoroscopy to use the remaining 10 mm graft limb to place a DrySeal catheter sheath into the distal arch and descending aorta. A  GORE CTAG 34x10, then a GOE CTAG 37x15, followed  y a GORE CTAG 40x15with landing zone in the elephant trunk  up to the takeoff the left subclavian.  Aortogram was performed showing good apposition without endoleak.  With the stent graft in the descending aorta satisfactorily placed and the patient's body temperature rewarmed to 37 degrees, the limb of the arch graft used for placing the stent was then stapled.  The retrograde catheter was removed.  The right superior pulmonary vein vent which had been placed was removed.  The patient was then ventilated and weaned from cardiopulmonary bypass.  He remained hemodynamically stable on low-dose dopamine, had good systolic function and the aortic valve was without insufficiency.  Protamine sulfate was administered.  We stapled across the CannulaGraft in the right axilla. Due to low platelet count and low fibrinogen, the patient was given blood products including platelets, fresh frozen, cryoprecipitate and factor 7 to reverse his coagulopathy.  With operative field hemostatic, 2 Blake drains were left in the mediastinum,  each of the limbs of the aortic arch graft were carefully inspected and were without bleeding and sitting nicely without kinking.  The sternum was then closed with #6 stainless steel wire.  Fascia closed with interrupted 0 Vicryl, running 3-0 Vicryl in subcutaneous tissue, 4-0 subcuticular stitch in skin edges.  Dry dressings were applied.  At the completion of the procedure, sponge and needle count was reported as correct.  The patient tolerated the procedure without obvious complication.  Total pump time was 397 minutes.  Circ arrest time 159 minutes.  Antegrade cerebral perfusion 159 minutes.     Sheliah Plane, MD    EG/MEDQ  D:  01/25/2017  T:  01/26/2017  Job:  [id number]

## 2017-01-26 NOTE — Progress Notes (Signed)
Report called to 4E RN. Will transfer patient shortly.

## 2017-01-27 ENCOUNTER — Inpatient Hospital Stay (HOSPITAL_COMMUNITY): Payer: Medicaid Other

## 2017-01-27 LAB — BASIC METABOLIC PANEL
Anion gap: 11 (ref 5–15)
BUN: 20 mg/dL (ref 6–20)
CO2: 25 mmol/L (ref 22–32)
Calcium: 8.6 mg/dL — ABNORMAL LOW (ref 8.9–10.3)
Chloride: 102 mmol/L (ref 101–111)
Creatinine, Ser: 1.16 mg/dL (ref 0.61–1.24)
GFR calc Af Amer: >60 mL/min (ref >60)
GFR calc non Af Amer: >60 mL/min (ref >60)
Glucose, Bld: 96 mg/dL (ref 65–99)
Potassium: 3.9 mmol/L (ref 3.5–5.1)
Sodium: 138 mmol/L (ref 135–145)

## 2017-01-27 LAB — CBC
HCT: 29.6 % — ABNORMAL LOW (ref 39.0–52.0)
Hemoglobin: 9.5 g/dL — ABNORMAL LOW (ref 13.0–17.0)
MCH: 27.5 pg (ref 26.0–34.0)
MCHC: 32.1 g/dL (ref 30.0–36.0)
MCV: 85.5 fL (ref 78.0–100.0)
Platelets: 226 10*3/uL (ref 150–400)
RBC: 3.46 MIL/uL — ABNORMAL LOW (ref 4.22–5.81)
RDW: 15.2 % (ref 11.5–15.5)
WBC: 11.8 10*3/uL — ABNORMAL HIGH (ref 4.0–10.5)

## 2017-01-27 LAB — GLUCOSE, CAPILLARY: Glucose-Capillary: 96 mg/dL (ref 65–99)

## 2017-01-27 MED ORDER — CHLORPROMAZINE HCL 10 MG PO TABS
10.0000 mg | ORAL_TABLET | Freq: Three times a day (TID) | ORAL | Status: DC | PRN
  Administered 2017-01-27: 10 mg via ORAL
  Filled 2017-01-27 (×3): qty 1

## 2017-01-27 MED ORDER — MOVING RIGHT ALONG BOOK
Freq: Once | Status: AC
  Administered 2017-01-27: 07:00:00
  Filled 2017-01-27 (×2): qty 1

## 2017-01-27 MED ORDER — LEVALBUTEROL HCL 0.63 MG/3ML IN NEBU
0.6300 mg | INHALATION_SOLUTION | Freq: Three times a day (TID) | RESPIRATORY_TRACT | Status: DC
  Filled 2017-01-27 (×2): qty 3

## 2017-01-27 MED ORDER — ONDANSETRON HCL 4 MG PO TABS: 4.0000 mg | ORAL_TABLET | Freq: Four times a day (QID) | ORAL | Status: DC | PRN

## 2017-01-27 MED ORDER — ONDANSETRON HCL 4 MG/2ML IJ SOLN: 4.0000 mg | Freq: Four times a day (QID) | INTRAMUSCULAR | Status: DC | PRN

## 2017-01-27 MED ORDER — METOPROLOL TARTRATE 12.5 MG HALF TABLET
12.5000 mg | ORAL_TABLET | Freq: Two times a day (BID) | ORAL | Status: DC
  Administered 2017-01-27 – 2017-01-28 (×2): 12.5 mg via ORAL
  Filled 2017-01-27 (×2): qty 1

## 2017-01-27 MED ORDER — PANTOPRAZOLE SODIUM 40 MG PO TBEC
40.0000 mg | DELAYED_RELEASE_TABLET | Freq: Every day | ORAL | Status: DC
  Administered 2017-01-28: 40 mg via ORAL
  Filled 2017-01-27: qty 1

## 2017-01-27 MED ORDER — SODIUM CHLORIDE 0.9% FLUSH
3.0000 mL | Freq: Two times a day (BID) | INTRAVENOUS | Status: DC
  Administered 2017-01-28 (×2): 3 mL via INTRAVENOUS

## 2017-01-27 MED ORDER — INSULIN ASPART 100 UNIT/ML ~~LOC~~ SOLN
0.0000 [IU] | Freq: Three times a day (TID) | SUBCUTANEOUS | Status: DC
  Administered 2017-01-28: 2 [IU] via SUBCUTANEOUS

## 2017-01-27 MED ORDER — GUAIFENESIN ER 600 MG PO TB12: 600.0000 mg | ORAL_TABLET | Freq: Two times a day (BID) | ORAL | Status: DC | PRN

## 2017-01-27 MED ORDER — SODIUM CHLORIDE 0.9% FLUSH: 3.0000 mL | INTRAVENOUS | Status: DC | PRN

## 2017-01-27 MED ORDER — OXYCODONE HCL 5 MG PO TABS
10.0000 mg | ORAL_TABLET | ORAL | Status: DC | PRN
  Administered 2017-01-28 (×2): 10 mg via ORAL
  Filled 2017-01-27 (×2): qty 2

## 2017-01-27 NOTE — Progress Notes (Signed)
CARDIAC REHAB PHASE I   PRE:  Rate/Rhythm: 97 SR with PACs    BP: sitting 135/65    SaO2: 100 1 1/2 L, 95 RA  MODE:  Ambulation: 400 ft   POST:  Rate/Rhythm: 106 ST    BP: sitting 126/77     SaO2: 92-93 RA  Pt fairly steady walking with RW. His main c/o is severe back pain. He was awaiting pain meds after walk and became fixated on this. SaO2 >92 RA walking. To bed. Encouraged more walking and IS and flutter. Left O2 off, RN aware.  1010-1100   Natia Fahmy Ethelda Chick CES, ACSM 01/27/2017 10:57 AM

## 2017-01-27 NOTE — Care Management Note (Signed)
Case Management Note Original Note Created Cherylann Parr, RN 01/26/2017, 10:15 AM   Patient Details  Name: Victor [name] MRN: [id number] Date of Birth: 05-Aug-1955  Subjective/Objective:   Pt is s/p   AORTIC ARCH DEBRANCHING (N/A) Replacement of Aortic Arch with circulatory arrest (N/A) TRANSESOPHAGEAL ECHOCARDIOGRAM (TEE) (N/A) THORACIC AORTIC ENDOVASCULAR STENT GRAFT (N/A) REDO STERNOTOMY (N/A)              Action/Plan:  PTA independent from home with mom.  Mom remains independent and will be with pt at discharge, she will transport pt home at discharge.  Pt states he has a walker in the home if needed at discharge.  Pt will need clinic appt at discharge and possible MATCH letter.  CM will continue to monitor discharge   Expected Discharge Date:                  Expected Discharge Plan:  Home/Self Care  In-House Referral:     Discharge planning Services  CM Consult, Medication Assistance, MATCH Program  Post Acute Care Choice:    Choice offered to:     DME Arranged:    DME Agency:     HH Arranged:    HH Agency:     Status of Service:  In process, will continue to follow  If discussed at Long Length of Stay Meetings, dates discussed:    Discharge Disposition:   Additional Comments:  01/27/17- 1500- Donn Pierini RN, CM- spoke with pt at bedside along with mom who will be available to assist pt at discharge- per pt he has a PCP- Dr. Billee Cashing- uses [facility] on [address] for pharmacy- will most likely need MATCH letter to assist with medications - pt asked about savings card for pharmacies- provided pt with prescription drug savings card to use in the future to assist with meds.  CM will continue to follow and assess medications for [facility] prior to discharge.   Zenda Alpers Taylors, RN 01/27/2017, 3:12 PM [phone]

## 2017-01-27 NOTE — Progress Notes (Addendum)
[name]       [place] [address]             [phone]      6 Days Post-Op Procedure(s) (LRB): AORTIC ARCH DEBRANCHING (N/A) Replacement of Aortic Arch with circulatory arrest (N/A) TRANSESOPHAGEAL ECHOCARDIOGRAM (TEE) (N/A) THORACIC AORTIC ENDOVASCULAR STENT GRAFT (N/A) REDO STERNOTOMY (N/A) Subjective: Primary c/o is back pain(chronic)  Objective: Vital signs in last 24 hours: Temp:  [98 F (36.7 C)-98.6 F (37 C)] 98.3 F (36.8 C) (10/04 0437) Pulse Rate:  [80-101] 101 (10/04 0437) Cardiac Rhythm: Normal sinus rhythm (10/04 0102) Resp:  [12-20] 16 (10/04 0437) BP: (109-148)/(60-82) 131/75 (10/04 0437) SpO2:  [86 %-100 %] 99 % (10/04 0437) FiO2 (%):  [32 %] 32 % (10/03 0814) Weight:  [163 lb (73.9 kg)] 163 lb (73.9 kg) (10/04 0437)  Hemodynamic parameters for last 24 hours:    Intake/Output from previous day: 10/03 0701 - 10/04 0700 In: 940 [P.O.:840; IV Piggyback:100] Out: 550 [Urine:550] Intake/Output this shift: No intake/output data recorded.  General appearance: alert, cooperative and no distress Heart: regular rate and rhythm Lungs: clear to auscultation bilaterally Abdomen: benign Extremities: + LE edema Wound: incis healing well  Lab Results:  Recent Labs  01/26/17 0749 01/27/17 0245  WBC 13.2* 11.8*  HGB 10.0* 9.5*  HCT 31.0* 29.6*  PLT 185 226   BMET:  Recent Labs  01/25/17 0318 01/26/17 0749  NA 136 138  K 3.7 4.0  CL 101 101  CO2 27 25  GLUCOSE 93 91  BUN 19 22*  CREATININE 1.11 1.15  CALCIUM 8.0* 8.3*    PT/INR: No results for input(s): LABPROT, INR in the last 72 hours. ABG    Component Value Date/Time   PHART 7.447 01/22/2017 0937   HCO3 23.2 01/22/2017 0937   TCO2 23 01/22/2017 1647   ACIDBASEDEF 1.0 01/22/2017 0937   O2SAT 94.0 01/22/2017 0937   CBG (last 3)  No results for input(s): GLUCAP in the last 72 hours.  Meds Scheduled Meds: . acetylcysteine  2 mL Nebulization Q6H WA  .  amiodarone  400 mg Oral Q12H   Followed by  . [START ON 02/01/2017] amiodarone  400 mg Oral Daily  . aspirin EC  81 mg Oral Daily   Or  . aspirin  81 mg Per Tube Daily  . bisacodyl  10 mg Oral Daily   Or  . bisacodyl  10 mg Rectal Daily  . docusate sodium  200 mg Oral Daily  . enoxaparin (LOVENOX) injection  30 mg Subcutaneous QHS  . furosemide  40 mg Oral Daily  . levalbuterol  0.63 mg Nebulization Q6H  . losartan  25 mg Oral Daily  . metoprolol tartrate  12.5 mg Oral BID   Or  . metoprolol tartrate  12.5 mg Per Tube BID  . pantoprazole  40 mg Oral Daily  . potassium chloride  20 mEq Oral Daily  . sodium chloride flush  3 mL Intravenous Q12H   Continuous Infusions: . sodium chloride Stopped (01/22/17 1145)  . sodium chloride    . sodium chloride 10 mL/hr (01/23/17 4259)  . sodium chloride    . cefTAZidime (FORTAZ)  IV Stopped (01/27/17 0403)  . lactated ringers    . lactated ringers Stopped (01/22/17 1000)  . lactated ringers 10 mL/hr at 01/24/17 1100  . phenylephrine (NEO-SYNEPHRINE) Adult infusion Stopped (01/22/17 0745)   PRN Meds:.sodium chloride, sodium chloride, bisacodyl **OR** bisacodyl, guaiFENesin, lactated ringers,  metoprolol tartrate, ondansetron (ZOFRAN) IV, ondansetron **OR** ondansetron (ZOFRAN) IV, oxyCODONE, potassium chloride, sodium chloride flush, traMADol  Xrays Dg Chest Port 1 View  Result Date: 01/26/2017 CLINICAL DATA:  Infiltrate EXAM: PORTABLE CHEST 1 VIEW COMPARISON:  January 25, 2017 FINDINGS: There is slightly less airspace consolidation in the left upper lobe compared to 1 day prior. Patchy infiltrate throughout the left upper lobe does remain. There has been partial clearing of infiltrate from the right mid lung with patchy opacity remaining in this area. There is a small right pleural effusion with right base atelectasis. No new opacity is evident on either side. There is no demonstrable pneumothorax. Heart is mildly enlarged with pulmonary vascular  within normal limits. There is an aortic stent graft. Prominence in the aortic arch region and descending aorta remains. No bone lesions. IMPRESSION: Overall less airspace consolidation in the left upper lobe and right mid lung regions compared to 1 day prior. Patchy infiltrate does remain in these areas currently. There is a small right pleural effusion with right base atelectasis. No new opacity identified on either side. The cardiac silhouette is stable. Prominence of the thoracic aorta remains with stent graft in place. Appearance in these areas remain stable. No evident adenopathy. Electronically Signed   By: Bretta Bang III M.D.   On: 01/26/2017 07:20    Assessment/Plan: S/P Procedure(s) (LRB): AORTIC ARCH DEBRANCHING (N/A) Replacement of Aortic Arch with circulatory arrest (N/A) TRANSESOPHAGEAL ECHOCARDIOGRAM (TEE) (N/A) THORACIC AORTIC ENDOVASCULAR STENT GRAFT (N/A) REDO STERNOTOMY (N/A)  1 doing well overall 2 hemodyn stable in sinus rhythm, SBP 100-140 range- cont current meds 3 H/H pretty stable, leukocytosis improving- on fortaz since 10/1 4 BS well controlled 5 cont diuresis 6 pulm toilet and routine rehab as able  LOS: 6 days    Victor [name],Victor [name] 01/27/2017   Poss home tomorrow I have seen and examined Victor [name] and agree with the above assessment  and plan.  [name] MD Bee[facility] [phone] Office [phone] 01/27/2017 9:43 AM

## 2017-01-27 NOTE — Progress Notes (Signed)
    Subjective  - POD #6  Eager to be walking today.   Physical Exam:  Palpable pedal pulses. Abdomen is soft Breathing is nonlabored     Assessment/Plan:  POD #6  possible discharge tomorrow  Durene Cal 01/27/2017 9:51 AM --  Vitals:   01/27/17 0843 01/27/17 0844  BP: 103/75   Pulse: 100   Resp: 18   Temp:    SpO2: 99% 100%    Intake/Output Summary (Last 24 hours) at 01/27/17 0951 Last data filed at 01/27/17 0423  Gross per 24 hour  Intake              700 ml  Output              550 ml  Net              150 ml     Laboratory CBC    Component Value Date/Time   WBC 11.8 (H) 01/27/2017 0245   HGB 9.5 (L) 01/27/2017 0245   HCT 29.6 (L) 01/27/2017 0245   PLT 226 01/27/2017 0245    BMET    Component Value Date/Time   NA 138 01/26/2017 0749   NA 141 12/23/2016 0813   K 4.0 01/26/2017 0749   CL 101 01/26/2017 0749   CO2 25 01/26/2017 0749   GLUCOSE 91 01/26/2017 0749   BUN 22 (H) 01/26/2017 0749   BUN 15 12/23/2016 0813   CREATININE 1.15 01/26/2017 0749   CREATININE 1.14 02/03/2015 0936   CALCIUM 8.3 (L) 01/26/2017 0749   GFRNONAA >60 01/26/2017 0749   GFRAA >60 01/26/2017 0749    COAG Lab Results  Component Value Date   INR 1.09 01/21/2017   INR 1.16 01/21/2017   INR 1.36 01/21/2017   No results found for: PTT  Antibiotics Anti-infectives    Start     Dose/Rate Route Frequency Ordered Stop   01/24/17 1100  cefTAZidime (FORTAZ) 2 g in dextrose 5 % 50 mL IVPB     2 g 100 mL/hr over 30 Minutes Intravenous Every 8 hours 01/24/17 1006     01/22/17 0115  vancomycin (VANCOCIN) IVPB 1000 mg/200 mL premix     1,000 mg 200 mL/hr over 60 Minutes Intravenous  Once 01/21/17 2138 01/22/17 0225   01/21/17 2200  cefUROXime (ZINACEF) 1.5 g in dextrose 5 % 50 mL IVPB     1.5 g 100 mL/hr over 30 Minutes Intravenous Every 12 hours 01/21/17 2138 01/23/17 0958   01/21/17 0400  vancomycin (VANCOCIN) 1,250 mg in sodium chloride 0.9 % 250 mL IVPB     1,250 mg 166.7 mL/hr over 90 Minutes Intravenous To Surgery 01/20/17 0834 01/21/17 0710   01/21/17 0400  cefUROXime (ZINACEF) 1.5 g in dextrose 5 % 50 mL IVPB     1.5 g 100 mL/hr over 30 Minutes Intravenous To Surgery 01/20/17 0834 01/21/17 1914   01/21/17 0400  cefUROXime (ZINACEF) 750 mg in dextrose 5 % 50 mL IVPB  Status:  Discontinued     750 mg 100 mL/hr over 30 Minutes Intravenous To Surgery 01/20/17 1142 01/21/17 2138   01/21/17 0400  cefUROXime (ZINACEF) 1.5 g in dextrose 5 % 50 mL IVPB  Status:  Discontinued     1.5 g 100 mL/hr over 30 Minutes Intravenous 30 min pre-op 01/20/17 0835 01/21/17 2138       V. Charlena Cross, M.D. [facility] Office: [phone] Pager:  [phone]

## 2017-01-28 LAB — GLUCOSE, CAPILLARY
Glucose-Capillary: 97 mg/dL (ref 65–99)
Glucose-Capillary: 135 mg/dL — ABNORMAL HIGH (ref 65–99)

## 2017-01-28 MED ORDER — AMIODARONE HCL 200 MG PO TABS: ORAL_TABLET | ORAL | 1 refills | Status: DC

## 2017-01-28 MED ORDER — METOPROLOL TARTRATE 25 MG PO TABS: 12.5000 mg | ORAL_TABLET | Freq: Two times a day (BID) | ORAL | 1 refills | Status: DC

## 2017-01-28 MED ORDER — LOSARTAN POTASSIUM 25 MG PO TABS: 25.0000 mg | ORAL_TABLET | Freq: Every day | ORAL | 1 refills | Status: DC

## 2017-01-28 MED ORDER — CHLORPROMAZINE HCL 10 MG PO TABS: 10.0000 mg | ORAL_TABLET | Freq: Three times a day (TID) | ORAL | 0 refills | Status: DC | PRN

## 2017-01-28 MED ORDER — ASPIRIN 81 MG PO TBEC: 81.0000 mg | DELAYED_RELEASE_TABLET | Freq: Every day | ORAL | Status: DC

## 2017-01-28 MED ORDER — OXYCODONE HCL 10 MG PO TABS: 10.0000 mg | ORAL_TABLET | Freq: Four times a day (QID) | ORAL | 0 refills | Status: DC | PRN

## 2017-01-28 MED ORDER — AMOXICILLIN-POT CLAVULANATE 500-125 MG PO TABS: 1.0000 | ORAL_TABLET | Freq: Three times a day (TID) | ORAL | 0 refills | Status: AC

## 2017-01-28 NOTE — Progress Notes (Signed)
Removed epicardial wires per order. 3 intact.  Pt tolerated procedure well.  Pt instructed to remain on bedrest for one hour.  Frequent vitals will be taken and documented. Pt resting with call bell within reach. Ashton Sabine McClintock, RN   

## 2017-01-28 NOTE — Discharge Instructions (Signed)
Thoracic Aortic Aneurysm An aneurysm is a bulge in an artery. It happens when blood pushes up against a weakened or damaged artery wall. A thoracic aortic aneurysm is an aneurysm that occurs in the first part of the aorta, between the heart and the diaphragm. The aorta is the main artery of the body. It supplies blood from the heart to the rest of the body. Some aneurysms may not cause symptoms or problems. However, the major concern with a thoracic aortic aneurysm is that it can enlarge and burst (rupture), or blood can flow between the layers of the wall of the aorta through a tear (aorticdissection). Both of these conditions can cause bleeding inside the body and can be life-threatening if they are not diagnosed and treated right away. What are the causes? The exact cause of this condition is not known. What increases the risk? The following factors may make you more likely to develop this condition:  Being age 65 or older.  Having a hardening of the arteries caused by the buildup of fat and other substances in the lining of a blood vessel (arteriosclerosis).  Having inflammation of the walls of an artery (arteritis).  Having a genetic disease that weakens the body's connective tissue, such as Marfan syndrome.  Having an injury or trauma to the aorta.  Having an infection that is caused by bacteria, such as syphilis or staphylococcus, in the wall of the aorta (infectious aortitis).  Having high blood pressure (hypertension).  Being male.  Being white (Caucasian).  Having high cholesterol.  Having a family history of aneurysms.  Using tobacco.  Having chronic obstructive pulmonary disease (COPD).  What are the signs or symptoms? Symptoms of this condition vary depending on the size and rate of growth of the aneurysm. Most grow slowly and do not cause any symptoms. When symptoms do occur, they may include:  Pain in the chest, back, sides, or abdomen. The pain may vary in  intensity. A sudden onset of severe pain may indicate that the aneurysm has ruptured.  Hoarseness.  Cough.  Shortness of breath.  Swallowing problems.  Swelling in the face, arms, or legs.  Fever.  Unexplained weight loss.  How is this diagnosed? This condition may be diagnosed with:  An ultrasound.  X-rays.  A CT scan.  An MRI.  Tests to check the arteries for damage or blockages (angiogram).  Most unruptured thoracic aortic aneurysms cause no symptoms, so they are often found during exams for other conditions. How is this treated? Treatment for this condition depends on:  The size of the aneurysm.  How fast the aneurysm is growing.  Your age.  Risk factors for rupture.  Aneurysms that are smaller than 2.2 inches (5.5 cm) may be managed by using medicines to control blood pressure, manage pain, or fight infection. You may need regular monitoring to see if the aneurysm is getting bigger. Your health care provider may recommend that you have an ultrasound every year or every 6 months. How often you need to have an ultrasound depends on the size of the aneurysm, how fast it is growing, and whether you have a family history of aneurysms. Surgical repair may be needed if your aneurysm is larger than 2.2 inches or if it is growing quickly. Follow these instructions at home: Eating and drinking  Eat a healthy diet. Your health care provider may recommend that you: ? Lower your salt (sodium) intake. In some people, too much salt can raise blood pressure and increase   the risk of thoracic aortic aneurysm. ? Avoid foods that are high in saturated fat and cholesterol, such as red meat and dairy. ? Eat a diet that is low in sugar. ? Increase your fiber intake by including whole grains, vegetables, and fruits in your diet. Eating these foods may help to lower blood pressure.  Limit or avoid alcohol as recommended by your health care provider. Lifestyle  Follow instructions  from your health care provider about healthy lifestyle habits. Your health care provider may recommend that you: ? Do not use any products that contain nicotine or tobacco, such as cigarettes and e-cigarettes. If you need help quitting, ask your health care provider. ? Keep your blood pressure within normal limits. The target limit for most people is below 120/80. Check your blood pressure regularly. If it is high, ask your health care provider about ways that you can control it. ? Keep your blood sugar (glucose) level and cholesterol levels within normal limits. Target limits for most people are:  Blood glucose level: Less than 100 mg/dL.  Total cholesterol level: Less than 200 mg/dL. ? Maintain a healthy weight. Activity  Stay physically active and exercise regularly. Talk with your health care provider about how often you should exercise and ask which types of exercise are safe for you.  Avoid heavy lifting and activities that take a lot of effort (are strenuous). Ask your health care provider what activities are safe for you. General instructions  Keep all follow-up visits as told by your health care provider. This is important. ? Talk with your health care provider about regular screenings to see if the aneurysm is getting bigger.  Take over-the-counter and prescription medicines only as told by your health care provider. Contact a health care provider if:  You have discomfort in your upper back, neck, or abdomen.  You have trouble swallowing.  You have a cough or hoarseness.  You have a family history of aneurysms.  You have unexplained weight loss. Get help right away if:  You have sudden, severe pain in your upper back and abdomen. This pain may move into your chest and arms.  You have shortness of breath.  You have a fever. This information is not intended to replace advice given to you by your health care provider. Make sure you discuss any questions you have with your  health care provider. Document Released: 04/12/2005 Document Revised: 01/23/2016 Document Reviewed: 01/23/2016 Elsevier Interactive Patient Education  2018 [employer]vier Inc.  

## 2017-01-28 NOTE — Progress Notes (Addendum)
      [address]       [place] [address]             [phone]      7 Days Post-Op Procedure(s) (LRB): AORTIC ARCH DEBRANCHING (N/A) Replacement of Aortic Arch with circulatory arrest (N/A) TRANSESOPHAGEAL ECHOCARDIOGRAM (TEE) (N/A) THORACIC AORTIC ENDOVASCULAR STENT GRAFT (N/A) REDO STERNOTOMY (N/A) Subjective: No issues overnight. Sleepy this morning, states that he didn't sleep well at all last night.   Objective: Vital signs in last 24 hours: Temp:  [98.1 F (36.7 C)-99.2 F (37.3 C)] 98.2 F (36.8 C) (10/05 0516) Pulse Rate:  [88-100] 98 (10/05 0516) Cardiac Rhythm: Normal sinus rhythm (10/05 0701) Resp:  [16-19] 19 (10/05 0516) BP: (103-128)/(64-83) 128/67 (10/05 0516) SpO2:  [94 %-100 %] 96 % (10/05 0516) FiO2 (%):  [28 %] 28 % (10/04 0844) Weight:  [162 lb 1.6 oz (73.5 kg)] 162 lb 1.6 oz (73.5 kg) (10/05 0516)     Intake/Output from previous day: 10/04 0701 - 10/05 0700 In: 1020 [P.O.:1020] Out: 1300 [Urine:1300] Intake/Output this shift: No intake/output data recorded.  General appearance: alert, cooperative and no distress Heart: regular rate and rhythm, S1, S2 normal, no murmur, click, rub or gallop Lungs: clear to auscultation bilaterally Abdomen: soft, non-tender; bowel sounds normal; no masses,  no organomegaly Extremities: extremities normal, atraumatic, no cyanosis or edema Wound: incisions c/d/i  Lab Results:  Recent Labs  01/26/17 0749 01/27/17 0245  WBC 13.2* 11.8*  HGB 10.0* 9.5*  HCT 31.0* 29.6*  PLT 185 226   BMET:  Recent Labs  01/26/17 0749 01/27/17 2025  NA 138 138  K 4.0 3.9  CL 101 102  CO2 25 25  GLUCOSE 91 96  BUN 22* 20  CREATININE 1.15 1.16  CALCIUM 8.3* 8.6*    PT/INR: No results for input(s): LABPROT, INR in the last 72 hours. ABG    Component Value Date/Time   PHART 7.447 01/22/2017 0937   HCO3 23.2 01/22/2017 0937   TCO2 23 01/22/2017 1647   ACIDBASEDEF 1.0 01/22/2017 0937   O2SAT  94.0 01/22/2017 0937   CBG (last 3)   Recent Labs  01/27/17 2220 01/28/17 0644  GLUCAP 96 97    Assessment/Plan: S/P Procedure(s) (LRB): AORTIC ARCH DEBRANCHING (N/A) Replacement of Aortic Arch with circulatory arrest (N/A) TRANSESOPHAGEAL ECHOCARDIOGRAM (TEE) (N/A) THORACIC AORTIC ENDOVASCULAR STENT GRAFT (N/A) REDO STERNOTOMY (N/A)  1. CV-BP well controlled on current regimen. NSR in the 90s. On Amio for atrial fibrillation. Continue Lopressor and Cozaar. 2. Pulm-tolerating room air with good oxygen saturation.  3. Renal-creatinine 1.16, electrolytes okay 4. H and H stable, platelets trending up 5. Endo-blood glucose level well controlled 6. Lovenox for DVT proph  Plan: EPW out today. Possibly home later today vs. Tomorrow.     LOS: 7 days    Sharlene Dory 01/28/2017  Stable, wants to go home  Plan d/c  conplete 10 days of antibiotics po  I have seen and examined [name] and agree with the above assessment  and plan.  [name] MD [facility] [phone] Office [phone] 01/28/2017 10:41 AM

## 2017-01-28 NOTE — Care Management (Signed)
MATCH Letter given to patient along with the Pharmacy List. Each Rx to be $3.00. No further needs from CM at this time. Gala Lewandowsky, RN,BSN [phone]

## 2017-01-28 NOTE — Progress Notes (Signed)
CARDIAC REHAB PHASE I   PRE:  Rate/Rhythm: 98 SR    BP: sitting 102/71    SaO2: 97 RA  MODE:  Ambulation: 470 ft   POST:  Rate/Rhythm: 107 ST    BP: sitting 121/68     SaO2: 96 RA  Pt stood and used RW for hall. Steady. Only c/o is back pain. Ed completed with pt. Will refer to maintenance CRP. Set up d/c video.  [id number]   Harriet Masson CES, ACSM 01/28/2017 12:00 PM

## 2017-01-28 NOTE — Discharge Summary (Signed)
Physician Discharge Summary  Patient ID: Victor [name] MRN: [id number] DOB/AGE: 60-Aug-1957 60 y.o.  Admit date: 01/21/2017 Discharge date: 01/28/2017  Admission Diagnoses: Patient Active Problem List   Diagnosis Date Noted  . Coronary artery calcification 12/23/2016  . Thoracic aortic aneurysm without rupture (HCC) 12/23/2016  . Preoperative cardiovascular examination 12/23/2016  . Essential hypertension 09/23/2015  . Iron deficiency anemia 01/28/2015  . Weight loss 10/30/2014  . Hospital-acquired pneumonia 10/09/2014  . Ischemic leg 10/09/2014  . Aortic dissection (HCC) 10/07/2014    Discharge Diagnoses:  Active Problems:   S/P thoracic aortic aneurysm repair   Discharged Condition: good  HPI:  Victor [name] 61 y.o. 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 Since surgery the patient has stopped smoking.   Since last seen he has consulted with vascular surgery about a combined the branching of the aortic arch and placement of thoracic aortic stent graft for enlarging distal arch aorta.  Cardiac cath has been done without significant coronary artery disease.  Hospital Course:  On 01/21/2017 Mr. Symonds underwent a replacement of an aortic arch with elephant trunk into the proximal descending thoracic aorta and TEVAR with Dr. Tyrone Sage and Dr. Myra Gianotti. The patient tolerated the procedure well and was transferred to the ICU. Patient was extubated in a timely manner. Postop day 1 we initiated diuretic regimen for fluid overload. We worked on blood glucose control. We began to mobilize the patient. He did have some expected acute blood loss anemia which we trended. He was extubated and neurologically intact. Postop day 2 he remained hemodynamically stable. We initiated Cozaar for blood pressure control. We continued to  watch his renal function closely. We discontinued her mediastinal chest tube. We continued to work on pain control. Postop day 3 we able to discontinue remaining tubes and lines. We started [place] for presumed pneumonia. Postop day 4 we continued to mobilize the patient. We continued a diuretic regimen for fluid overload. We awaited sputum cultures. Postop day 5 he was deemed stable enough to transfer to the telemetry unit. His chest x-ray was improved and renal function remained stable. On the floor he continued to progress. He was ambulating with limited assistance. On postop day 6 we discontinued his epicardial pacing wires. He was ambulating with limited assistance, tolerating room air without issue, his incisions are healing well, and he is ready for discharge home.  Consults: vascular surgery  Significant Diagnostic Studies: CLINICAL DATA:  Postop check. Thoracic aortic stent graft 01/21/2017.  EXAM: CHEST  2 VIEW  COMPARISON:  01/26/2017 and 01/25/2017  FINDINGS: Lungs are adequately inflated demonstrate improving hazy airspace process over the left mid upper lung and right mid to lower lung. No definite effusion. Emphysematous disease over the right apex. Stable cardiomegaly. Stable prominence of the aortic arch. No change in patient's aortic stent graft. Remainder of the exam is unchanged.  IMPRESSION: Interval improvement in hazy airspace density over the left mid upper lung and right mid to lower lung. No effusion.  No change aortic stent graft.   Electronically Signed   By: Elberta Fortis M.D.   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  PROCEDURE PERFORMED: 1. Redo sternotomy. 2. Cardiopulmonary bypass with hypothermic arrest and antegrade     cerebral perfusion. 3. Replacement of aortic arch with elephant trunk into the proximal     descending thoracic aorta with a 30 x 10 x 8 x 8 x 10 Hemashield     graft with 10 mm arm to the innominate, 8 mm arm to the left     carotid, and 8 mm arm to the left subclavian.     4. TEVAR placement antegrade withGORE CTAG 34x10, then a GOE CTAG 37x15, followed by a GORE CTAG 40x15   5. IVUS of the aortic arch and descending thoracic aorta, and upper abdominal aorta, including visceral vessels (celiac, SMA, bilateral renal arteries) 6. Thoracic and abdominal aortogram   CO-SURGEONS: 1. Dr. Tyrone Sage, Cardiac Surgery. 2. Dr. Myra Gianotti, Vascular Surgery.   Discharge Exam: Blood pressure 113/65, pulse 94, temperature 98.4 F (36.9 C), temperature source Oral, resp. rate 20, height (P) 5' 6.93" (1.7 m), weight 162 lb 1.6 oz (73.5 kg), SpO2 98 %.   General appearance: alert, cooperative and no distress Heart: regular rate and rhythm, S1, S2 normal, no murmur, click, rub or gallop Lungs: clear to auscultation bilaterally Abdomen: soft, non-tender; bowel sounds normal; no masses,  no organomegaly Extremities: extremities normal, atraumatic, no cyanosis or edema Wound: incisions c/d/i  Disposition: 01-Home or Self Care   Allergies as of 01/28/2017   No Known Allergies     Medication List    STOP taking these medications   oxyCODONE-acetaminophen 10-325 MG tablet Commonly known as:  PERCOCET     TAKE these medications   amiodarone 200 MG tablet Commonly known as:  PACERONE Please take 2 tabs twice a day for 2 days and then take one tab twice a day  until we see you in the office.   amoxicillin-clavulanate 500-125 MG tablet Commonly known as:  AUGMENTIN Take 1 tablet (500 mg total) by mouth 3 (three) times daily.   aspirin 81 MG EC tablet Take 1 tablet (81 mg total) by mouth daily. What changed:  medication strength  how much to take   chlorproMAZINE 10 MG tablet Commonly known as:  THORAZINE Take 1 tablet (10 mg total) by mouth every 8 (eight) hours as needed (hiccups).   losartan 25 MG tablet Commonly known as:  COZAAR Take 1 tablet (25 mg total) by mouth daily.   metoprolol tartrate 25 MG tablet Commonly known as:  LOPRESSOR Take 0.5 tablets (12.5 mg total) by mouth 2 (two) times daily. What changed:  See the new instructions.   Oxycodone HCl 10 MG Tabs Take 1 tablet (10 mg total) by mouth every 6 (six) hours as needed for severe pain.      Follow-up Information    [name], MD Follow up.   Specialty:  Cardiothoracic Surgery Why:  Your follow-up appointment is on 02/24/2017 at 12:30 PM. Please arrive at 12 PM for a chest x-ray at [facility] imaging  which is located on the first floor of our building. Contact information: [address] [place] [address] [phone]        Billee Cashing, MD. Call in 1 day(s).   Specialty:  Family Medicine Contact information: [address] [place] [address] [phone]        Azalee Course, [place] Follow up.   Specialties:  Cardiology, Radiology Why:  Azalee Course, PA-C 10/25  ([facility]). Please bring your medication list Contact information: [address] [place] [address] [phone]           Signed: Rowe Clack 01/28/2017, 1:07 PM

## 2017-01-28 NOTE — Progress Notes (Signed)
    Subjective  - POD #7  Wants to go home   Physical Exam:  Palpable pulses Incisions look good       Assessment/Plan:  POD #7  Poss d/c today  Brabham, [place] 01/28/2017 8:03 AM --  Vitals:   01/27/17 2144 01/28/17 0516  BP: 118/68 128/67  Pulse: 88 98  Resp:  19  Temp:  98.2 F (36.8 C)  SpO2:  96%    Intake/Output Summary (Last 24 hours) at 01/28/17 0803 Last data filed at 01/28/17 0500  Gross per 24 hour  Intake              780 ml  Output             1300 ml  Net             -520 ml     Laboratory CBC    Component Value Date/Time   WBC 11.8 (H) 01/27/2017 0245   HGB 9.5 (L) 01/27/2017 0245   HCT 29.6 (L) 01/27/2017 0245   PLT 226 01/27/2017 0245    BMET    Component Value Date/Time   NA 138 01/27/2017 2025   NA 141 12/23/2016 0813   K 3.9 01/27/2017 2025   CL 102 01/27/2017 2025   CO2 25 01/27/2017 2025   GLUCOSE 96 01/27/2017 2025   BUN 20 01/27/2017 2025   BUN 15 12/23/2016 0813   CREATININE 1.16 01/27/2017 2025   CREATININE 1.14 02/03/2015 0936   CALCIUM 8.6 (L) 01/27/2017 2025   GFRNONAA >60 01/27/2017 2025   GFRAA >60 01/27/2017 2025    COAG Lab Results  Component Value Date   INR 1.09 01/21/2017   INR 1.16 01/21/2017   INR 1.36 01/21/2017   No results found for: PTT  Antibiotics Anti-infectives    Start     Dose/Rate Route Frequency Ordered Stop   01/24/17 1100  cefTAZidime (FORTAZ) 2 g in dextrose 5 % 50 mL IVPB     2 g 100 mL/hr over 30 Minutes Intravenous Every 8 hours 01/24/17 1006     01/22/17 0115  vancomycin (VANCOCIN) IVPB 1000 mg/200 mL premix     1,000 mg 200 mL/hr over 60 Minutes Intravenous  Once 01/21/17 2138 01/22/17 0225   01/21/17 2200  cefUROXime (ZINACEF) 1.5 g in dextrose 5 % 50 mL IVPB     1.5 g 100 mL/hr over 30 Minutes Intravenous Every 12 hours 01/21/17 2138 01/23/17 0958   01/21/17 0400  vancomycin (VANCOCIN) 1,250 mg in sodium chloride 0.9 % 250 mL IVPB     1,250 mg 166.7 mL/hr over 90  Minutes Intravenous To Surgery 01/20/17 0834 01/21/17 0710   01/21/17 0400  cefUROXime (ZINACEF) 1.5 g in dextrose 5 % 50 mL IVPB     1.5 g 100 mL/hr over 30 Minutes Intravenous To Surgery 01/20/17 0834 01/21/17 1914   01/21/17 0400  cefUROXime (ZINACEF) 750 mg in dextrose 5 % 50 mL IVPB  Status:  Discontinued     750 mg 100 mL/hr over 30 Minutes Intravenous To Surgery 01/20/17 1142 01/21/17 2138   01/21/17 0400  cefUROXime (ZINACEF) 1.5 g in dextrose 5 % 50 mL IVPB  Status:  Discontinued     1.5 g 100 mL/hr over 30 Minutes Intravenous 30 min pre-op 01/20/17 0835 01/21/17 2138       V. Charlena Cross, M.D. [facility] Office: [phone] Pager:  [phone]

## 2017-02-07 ENCOUNTER — Encounter (HOSPITAL_COMMUNITY): Payer: Self-pay

## 2017-02-07 ENCOUNTER — Ambulatory Visit: Payer: Self-pay | Admitting: Surgery

## 2017-02-14 ENCOUNTER — Encounter (HOSPITAL_COMMUNITY): Payer: Self-pay | Admitting: Cardiothoracic Surgery

## 2017-02-17 ENCOUNTER — Encounter: Payer: Self-pay | Admitting: Physician Assistant

## 2017-02-17 ENCOUNTER — Ambulatory Visit (INDEPENDENT_AMBULATORY_CARE_PROVIDER_SITE_OTHER): Payer: Self-pay | Admitting: Physician Assistant

## 2017-02-17 VITALS — BP 112/72 | HR 83 | Ht 67.0 in | Wt 157.0 lb

## 2017-02-17 DIAGNOSIS — Z9889 Other specified postprocedural states: Secondary | ICD-10-CM

## 2017-02-17 DIAGNOSIS — I499 Cardiac arrhythmia, unspecified: Secondary | ICD-10-CM

## 2017-02-17 DIAGNOSIS — I491 Atrial premature depolarization: Secondary | ICD-10-CM

## 2017-02-17 DIAGNOSIS — Z8679 Personal history of other diseases of the circulatory system: Secondary | ICD-10-CM

## 2017-02-17 DIAGNOSIS — I10 Essential (primary) hypertension: Secondary | ICD-10-CM

## 2017-02-17 DIAGNOSIS — D509 Iron deficiency anemia, unspecified: Secondary | ICD-10-CM

## 2017-02-17 LAB — COMPREHENSIVE METABOLIC PANEL
ALBUMIN: 4 g/dL (ref 3.6–4.8)
ALT: 13 IU/L (ref 0–44)
AST: 13 IU/L (ref 0–40)
Albumin/Globulin Ratio: 1.4 (ref 1.2–2.2)
Alkaline Phosphatase: 105 IU/L (ref 39–117)
BUN / CREAT RATIO: 9 — AB (ref 10–24)
BUN: 11 mg/dL (ref 8–27)
Bilirubin Total: 0.7 mg/dL (ref 0.0–1.2)
CALCIUM: 9.5 mg/dL (ref 8.6–10.2)
CO2: 25 mmol/L (ref 20–29)
CREATININE: 1.28 mg/dL — AB (ref 0.76–1.27)
Chloride: 104 mmol/L (ref 96–106)
GFR, EST AFRICAN AMERICAN: 70 mL/min/{1.73_m2} (ref >59)
GFR, EST NON AFRICAN AMERICAN: 60 mL/min/{1.73_m2} (ref >59)
GLOBULIN, TOTAL: 2.8 g/dL (ref 1.5–4.5)
Glucose: 89 mg/dL (ref 65–99)
Potassium: 4.8 mmol/L (ref 3.5–5.2)
SODIUM: 142 mmol/L (ref 134–144)
TOTAL PROTEIN: 6.8 g/dL (ref 6.0–8.5)

## 2017-02-17 LAB — CBC
HEMATOCRIT: 33.8 % — AB (ref 37.5–51.0)
Hemoglobin: 10.8 g/dL — ABNORMAL LOW (ref 13.0–17.7)
MCH: 28.1 pg (ref 26.6–33.0)
MCHC: 32 g/dL (ref 31.5–35.7)
MCV: 88 fL (ref 79–97)
PLATELETS: 381 10*3/uL — AB (ref 150–379)
RBC: 3.84 x10E6/uL — ABNORMAL LOW (ref 4.14–5.80)
RDW: 15.9 % — AB (ref 12.3–15.4)
WBC: 6.5 10*3/uL (ref 3.4–10.8)

## 2017-02-17 NOTE — Progress Notes (Signed)
Cardiology Office Note    Date:  02/19/2017   ID:  SETH HIGGINBOTHAM, DOB Jan 26, 1956, MRN [id number]  PCP:  Billee Cashing, MD  Cardiologist:  Dr. Rennis Golden  Chief Complaint  Patient presents with  . Follow-up    pt c/o chest pain and SOB for a few days now    History of Present Illness:  Victor [name] is a 61 y.o. male with PMH of tobacco abuse, polysubstance abuse and h/o thoracic aortic dissection s/p repair. He has no family history of CAD. He was admitted in June 2016 and underwent repair of ascending aortic dissection with replacement of ascending aorta and the suspicion of the aortic valve with cardiopulmonary bypass. TEE during the surgery revealed ejection fraction of 60-65%. He also required femorofemoral bypass due to ischemic right leg in the early postoperative period. He was last seen in office on 09/23/2015, at which time his metoprolol has already been increased to 25 mg twice a day by Dr. Tyrone Sage, his blood pressure was well-controlled and he was doing well from cardiology perspective. Otherwise his only complaint was his large inguinal hernia which he has followed up with general surgery. 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 Since the last time I saw him, he underwent stress test on 12/15/2016 for preoperative clearance. Stress test showed EF 55%, no ischemia or infarction, low risk study. Echocardiogram obtained on 12/23/2016 showed EF 60-65%, mild LVH, grade 2 DD.  Cardiac catheterization performed on 01/11/2017 showed angiographically normal coronary arteries, mild narrowing at the ostium of left main, normal right heart pressure. Due to the enlarging distal aortic arch, he eventually underwent aortic reconstruction, TEVAR,  by Dr. Myra Gianotti and Dr.  Tyrone Sage. He underwent standard diuresis for volume overload on postoperative day one. He was treated with Elita Quick for presumed pneumonia. He has been doing well after discharge. He is still taking amiodarone at this time. His amiodarone likely can be stopped once he finished the current bottle. Based EKG today, he has PAC every third beat. His blood pressure is 112/72, if palpitation become more frequent, we can consider stopping losartan and increase metoprolol to 25 mg twice a day. Otherwise his sternal wound is well-healed. He does have some degree of chest soreness and insomnia, I will defer those issue to his primary care provider and surgeon.     Past Medical History:  Diagnosis Date  . History of kidney stones   . Hypertension   . Thoracic aortic aneurysm (HCC) 09/2014    Past Surgical History:  Procedure Laterality Date  . ASCENDING AORTIC ROOT REPLACEMENT N/A 01/21/2017   Procedure: Replacement of Aortic Arch with circulatory arrest;  Surgeon: [name], MD;  Location: [facility] OR;  Service: Open Heart Surgery;  Laterality: N/A;  . CARDIAC CATHETERIZATION    . FEMORAL-FEMORAL BYPASS GRAFT N/A 10/07/2014   Procedure: LEFT  FEMORAL ARTERY -RIGHT FEMORAL ARTERY BYPASS GRAFT USING X 30 CM HEMASHIELD GOLD GRAFT;  Surgeon: Larina Earthly, MD;  Location[facility] OR;  Service: Vascular;  Laterality: N/A;  . RIGHT/LEFT HEART CATH AND CORONARY ANGIOGRAPHY N/A 01/11/2017   Procedure: RIGHT/LEFT HEART CATH AND CORONARY ANGIOGRAPHY- Diagnostic Only;  Surgeon: [name], MD;  Location: [facility] INVASIVE CV LAB;  Service: Cardiovascular;  Laterality: N/A;  . STERNOTOMY N/A 01/21/2017   Procedure:  REDO STERNOTOMY;  Surgeon: [name], MD;  Location: [facility] OR;  Service: Open Heart Surgery;  Laterality: N/A;  . TEE WITHOUT CARDIOVERSION N/A 01/21/2017   Procedure: TRANSESOPHAGEAL ECHOCARDIOGRAM (TEE);  Surgeon: [name], MD;  Location: [facility] OR;  Service: Open Heart Surgery;  Laterality: N/A;    . THORACIC AORTIC ANEURYSM REPAIR N/A 10/06/2014   Procedure: THORACIC ASCENDING ANEURYSM REPAIR (AAA);  Surgeon: [name], MD;  Location: [facility] OR;  Service: Open Heart Surgery;  Laterality: N/A;  hyportermia circulatory arrest and resuspension of aortic valve  . THORACIC AORTIC ENDOVASCULAR STENT GRAFT N/A 01/21/2017   Procedure: THORACIC AORTIC ENDOVASCULAR STENT GRAFT;  Surgeon: Nada Libman, MD;  Location: [facility] OR;  Service: Vascular;  Laterality: N/A;    Current Medications: Outpatient Medications Prior to Visit  Medication Sig Dispense Refill  . amiodarone (PACERONE) 200 MG tablet Please take 2 tabs twice a day for 2 days and then take one tab twice a day until we see you in the office. 70 tablet 1  . aspirin 81 MG EC tablet Take 1 tablet (81 mg total) by mouth daily.    . chlorproMAZINE (THORAZINE) 10 MG tablet Take 1 tablet (10 mg total) by mouth every 8 (eight) hours as needed (hiccups). 20 tablet 0  . losartan (COZAAR) 25 MG tablet Take 1 tablet (25 mg total) by mouth daily. 30 tablet 1  . metoprolol tartrate (LOPRESSOR) 25 MG tablet Take 0.5 tablets (12.5 mg total) by mouth 2 (two) times daily. 30 tablet 1  . oxyCODONE 10 MG TABS Take 1 tablet (10 mg total) by mouth every 6 (six) hours as needed for severe pain. 30 tablet 0   No facility-administered medications prior to visit.      Allergies:   Patient has no known allergies.   Social History   Social History  . Marital status: Single    Spouse name: N/A  . Number of children: N/A  . Years of education: N/A   Social History Main Topics  . Smoking status: Former Smoker    Years: 35.00    Types: Cigarettes, Cigars    Quit date: 10/07/2014  . Smokeless tobacco: Never Used  . Alcohol use No  . Drug use: No  . Sexual activity: Not Asked   Other Topics Concern  . None   Social History Narrative  . None     Family History:  The patient's family history includes Heart murmur in his mother.   ROS:   Please  see the history of present illness.    ROS All other systems reviewed and are negative.   PHYSICAL EXAM:   VS:  BP 112/72   Pulse 83   Ht 5\' 7"  (1.702 m)   Wt 157 lb (71.2 kg)   BMI 24.59 kg/m    GEN: Well nourished, well developed, in no acute distress  HEENT: normal  Neck: no JVD, carotid bruits, or masses Cardiac: regularly irregular; no murmurs, rubs, or gallops,no edema. sternal wound well-healed Respiratory:  clear to auscultation bilaterally, normal work of breathing GI: soft, nontender, nondistended, + BS MS: no deformity or atrophy  Skin: warm and dry, no rash Neuro:  Alert and Oriented x 3, Strength and sensation are intact Psych: euthymic mood, full affect  Wt Readings from Last 3 Encounters:  02/17/17 157 lb (71.2 kg)  01/28/17 162 lb 1.6 oz (73.5 kg)  01/20/17 168 lb (76.2 kg)      Studies/Labs Reviewed:   EKG:  EKG is ordered today.  The ekg ordered today demonstrates Normal sinus rhythm, heart rate 83, PACs occurs on every fourth beat  Recent Labs: 01/25/2017: Magnesium 2.0; TSH 1.456 02/17/2017: ALT 13; BUN 11; Creatinine, Ser 1.28; Hemoglobin 10.8; Platelets 381; Potassium 4.8; Sodium 142   Lipid Panel    Component Value Date/Time   CHOL 121 12/23/2016 0813   TRIG 57 12/23/2016 0813   HDL 43 12/23/2016 0813   CHOLHDL 2.8 12/23/2016 0813   CHOLHDL 2.5 02/03/2015 0936   VLDL 12 02/03/2015 0936   LDLCALC 67 12/23/2016 0813    Additional studies/ records that were reviewed today include:   Myoview 12/15/2016 Study Highlights     The left ventricular ejection fraction is normal (55-65%).  Nuclear stress EF: 59%.  There was no ST segment deviation noted during stress.  The study is normal.  This is a low risk study.   Normal resting and stress perfusion. No ischemia or infarction EF 59%      Echo 12/23/2016 LV EF: 60% -   65% Study Conclusions  - Left ventricle: The cavity size was normal. Wall thickness was   increased in a  pattern of mild LVH. Systolic function was normal.   The estimated ejection fraction was in the range of 60% to 65%.   Wall motion was normal; there were no regional wall motion   abnormalities. Features are consistent with a pseudonormal left   ventricular filling pattern, with concomitant abnormal relaxation   and increased filling pressure (grade 2 diastolic dysfunction). - Aortic valve: There was trivial regurgitation.    Cath 01/11/2017 Conclusion   1. Widely patent, angiographically normal, dominant RCA 2. Angiographically normal LAD and LCx 3. Mild narrowing at the ostium of the left main without pressure dampening 4. Normal right heart hemodynamics      OPERATIVE REPORT 01/21/2017   PREOPERATIVE DIAGNOSES: Expanding distal arch aneurysm after previous acute type 1 aortic dissection.  POSTOPERATIVE DIAGNOSIS: Expanding distal arch aneurysm after previous acute type 1 aortic dissection.  PROCEDURE PERFORMED: 1. Redo sternotomy. 2. Cardiopulmonary bypass with hypothermic arrest and antegrade cerebral perfusion. 3. Replacement of aortic arch with elephant trunk into the proximal descending thoracic aorta with a 30 x 10 x 8 x 8 x 10 Hemashield graft with 10 mm arm to the innominate, 8 mm arm to the left carotid, and 8 mm arm to the left subclavian.   4. TEVAR placement antegrade withGORE CTAG 34x10, then a GOE CTAG 37x15, followed by a GORE CTAG 40x15  5.IVUS of the aortic arch and descending thoracic aorta, and upper abdominal aorta, including visceral vessels (celiac, SMA, bilateral renal arteries)  ASSESSMENT:    1. Irregular heart beat   2. Essential hypertension   3. S/P aortic dissection repair   4. S/P thoracic aortic aneurysm repair   5. PAC (premature atrial contraction)   6. Iron deficiency anemia, unspecified iron deficiency anemia type      PLAN:  In order of problems listed above:  1. Irregular heart beat: EKG shows  frequent PACs. His amiodarone likely can be discontinued once finished the current bottle.  2. Postop anemia: Obtain CBC and CMP  3. S/p TEVAR: Recovering well, sternal site well-healed.   Medication Adjustments/Labs and Tests Ordered: Current medicines are reviewed at length with the patient today.  Concerns regarding medicines are outlined above.  Medication changes, Labs and Tests ordered today are listed in the Patient Instructions below. Patient Instructions  [facility] CBC ,CMP  NO CHANGE WITH MEDICATIONS     Your physician recommends that you schedule a follow-up appointment in 2 - 3 MONTHS WITH DR HILTY     If you need a refill on your cardiac medications before your next appointment, please call your pharmacy.     Ramond DialSigned, Jordan Caraveo, [place]  02/19/2017 9:29 AM    [facility] [address], [place], [place]  [address] Phone: [phone]; Fax: [phone]

## 2017-02-17 NOTE — Patient Instructions (Signed)
[facility] CBC ,CMP   NO CHANGE WITH MEDICATIONS     Your physician recommends that you schedule a follow-up appointment in 2 - 3 MONTHS WITH DR HILTY     If you need a refill on your cardiac medications before your next appointment, please call your pharmacy.

## 2017-02-19 ENCOUNTER — Encounter: Payer: Self-pay | Admitting: Physician Assistant

## 2017-02-22 ENCOUNTER — Ambulatory Visit
Admission: RE | Admit: 2017-02-22 | Discharge: 2017-02-22 | Disposition: A | Discharge status: Home / Self Care | Payer: No Typology Code available for payment source | Source: Ambulatory Visit | Attending: Cardiothoracic Surgery | Admitting: Cardiothoracic Surgery

## 2017-02-22 ENCOUNTER — Ambulatory Visit (INDEPENDENT_AMBULATORY_CARE_PROVIDER_SITE_OTHER): Payer: Self-pay | Admitting: Cardiothoracic Surgery

## 2017-02-22 ENCOUNTER — Encounter: Payer: Self-pay | Admitting: Cardiothoracic Surgery

## 2017-02-22 ENCOUNTER — Telehealth: Payer: Self-pay | Admitting: Internal Medicine

## 2017-02-22 ENCOUNTER — Other Ambulatory Visit: Payer: Self-pay | Admitting: Cardiothoracic Surgery

## 2017-02-22 VITALS — BP 136/88 | HR 71 | Ht 67.0 in | Wt 154.0 lb

## 2017-02-22 DIAGNOSIS — Z79899 Other long term (current) drug therapy: Secondary | ICD-10-CM

## 2017-02-22 DIAGNOSIS — Z9889 Other specified postprocedural states: Principal | ICD-10-CM

## 2017-02-22 DIAGNOSIS — Z8679 Personal history of other diseases of the circulatory system: Secondary | ICD-10-CM

## 2017-02-22 DIAGNOSIS — I712 Thoracic aortic aneurysm, without rupture, unspecified: Secondary | ICD-10-CM

## 2017-02-22 DIAGNOSIS — I71019 Dissection of thoracic aorta, unspecified: Secondary | ICD-10-CM

## 2017-02-22 DIAGNOSIS — Z09 Encounter for follow-up examination after completed treatment for conditions other than malignant neoplasm: Secondary | ICD-10-CM

## 2017-02-22 DIAGNOSIS — I7101 Dissection of thoracic aorta: Secondary | ICD-10-CM

## 2017-02-22 NOTE — Telephone Encounter (Signed)
Patient called w/results. Repeat BMET ordered and lap slip mailed

## 2017-02-22 NOTE — Telephone Encounter (Signed)
Notes recorded by Azalee CourseMeng, Hao, PA on 02/17/2017 at 5:19 PM EDT Renal function worsened only slightly but still within tolerable limit, BMET stable, repeat lab in 2 month

## 2017-02-22 NOTE — Progress Notes (Signed)
[address]       [place] [address]             [phone]      Victor [name] [facility] Medical Record #[id number] Date of Birth: 1955-10-26  Referring: Billee Cashing, MD Primary Care: Billee Cashing, MD  Chief Complaint:   POST OP FOLLOW UP 01/21/2017  OPERATIVE REPORT PREOPERATIVE DIAGNOSES:  Expanding distal arch aneurysm after previous acute type 1 aortic dissection. 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 PROCEDURE PERFORMED: 1. Redo sternotomy. 2. Cardiopulmonary bypass with hypothermic arrest and antegrade     cerebral perfusion. 3. Replacement of aortic arch with elephant trunk into the proximal     descending thoracic aorta with a 30 x 10 x 8 x 8 x 10 Hemashield     graft with 10 mm arm to the innominate, 8 mm arm to the left     carotid, and 8 mm arm to the left subclavian.   4. TEVAR placement antegrade withGORE CTAG 34x10, then a GOE CTAG 37x15, followed by a GORE CTAG 40x15   5. IVUS of the aortic arch and descending thoracic aorta, and upper abdominal aorta, including visceral vessels (celiac, SMA, bilateral renal arteries) 6. Thoracic and abdominal aortogram CO-SURGEONS: 1. Dr. Tyrone Sage, Cardiac Surgery. 2. Dr. Myra Gianotti, Vascular Surgery.  History of Present Illness:     Patient returns to the office in follow-up after his recent redo procedure.  He is making good progress at home.  He denies shortness of breath or evidence of congestive heart failure.  He notes that he has returned to driving without difficulty.  He is no longer taking any pain medication.  His only complaint is some numbness along his right small and ring finger in the medial aspect of his arm-consistent with brachial stretch.  He notes he has good strength in his hands  He has had no neurologic symptoms Past Medical History:  Diagnosis Date  . History of kidney stones   . Hypertension   .  Thoracic aortic aneurysm (HCC) 09/2014     History  Smoking Status  . Former Smoker  . Years: 35.00  . Types: Cigarettes, Cigars  . Quit date: 10/07/2014  Smokeless Tobacco  . Never Used    History  Alcohol Use No     No Known Allergies  Current Outpatient Prescriptions  Medication Sig Dispense Refill  . amiodarone (PACERONE) 200 MG tablet Please take 2 tabs twice a day for 2 days and then take one tab twice a day until we see you in the office. 70 tablet 1  . aspirin 81 MG EC tablet Take 1 tablet (81 mg total) by mouth daily.    . chlorproMAZINE (THORAZINE) 10 MG tablet Take 1 tablet (10 mg total) by mouth every 8 (eight) hours as needed (hiccups). 20 tablet 0  . losartan (COZAAR) 25 MG tablet Take 1 tablet (25 mg total) by mouth daily. 30 tablet 1  . metoprolol tartrate (LOPRESSOR) 25 MG tablet Take 0.5 tablets (12.5 mg total) by mouth 2 (two) times daily. 30 tablet 1  . oxyCODONE 10 MG TABS Take 1 tablet (10 mg total) by mouth every 6 (six) hours as needed for severe pain. 30 tablet 0   No current facility-administered medications for this visit.        Physical Exam: BP 136/88   Pulse 71   Ht 5\' 7"  (1.702 m)  Wt 154 lb (69.9 kg)   SpO2 99%   BMI 24.12 kg/m   General appearance: alert and cooperative Neurologic: intact Heart: regular rate and rhythm, S1, S2 normal, no murmur, click, rub or gallop Lungs: clear to auscultation bilaterally Abdomen: soft, non-tender; bowel sounds normal; no masses,  no organomegaly Extremities: extremities normal, atraumatic, no cyanosis or edema, Homans sign is negative, no sign of DVT and Good strength in both hands radial brachial pulses are equal bilaterally some numbness decreased sensation right small finger Wound: Patient's chest incision and right infraclavicular incisions are well-healed without evidence of infection Strength in his hands bilaterally is good including the small and ring fingers  Diagnostic Studies &  Laboratory data:     Recent Radiology Findings:   Dg Chest 2 View  Result Date: 02/22/2017 CLINICAL DATA:  Status post aneurysm repair 10/06/2014, now with shortness of breath. Hypertension. EXAM: CHEST  2 VIEW COMPARISON:  01/27/2017. FINDINGS: Stable cardiomediastinal silhouette. Stable appearance of thoracic aortic graft. Native aorta extends considerably beyond the graft. There is no consolidation or edema. Chronic scarring RIGHT mid to lower lung zone. No effusion or pneumothorax. No osseous finding of acuity. IMPRESSION: Stable appearance. No definite active findings or interval change from recent priors. Electronically Signed   By: Elsie StainJohn T Curnes M.D.   On: 02/22/2017 15:35   I have independently reviewed the above radiology studies  and reviewed the findings with the patient.    Recent Lab Findings: Lab Results  Component Value Date   WBC 6.5 02/17/2017   HGB 10.8 (L) 02/17/2017   HCT 33.8 (L) 02/17/2017   PLT 381 (H) 02/17/2017   GLUCOSE 89 02/17/2017   CHOL 121 12/23/2016   TRIG 57 12/23/2016   HDL 43 12/23/2016   LDLCALC 67 12/23/2016   ALT 13 02/17/2017   AST 13 02/17/2017   NA 142 02/17/2017   K 4.8 02/17/2017   CL 104 02/17/2017   CREATININE 1.28 (H) 02/17/2017   BUN 11 02/17/2017   CO2 25 02/17/2017   TSH 1.456 01/25/2017   INR 1.09 01/21/2017   HGBA1C 5.8 (H) 01/19/2017      Assessment / Plan:   Patient returns after recent redo arch replacement with elephant trunk and thoracic stent graft. The patient is making good postoperative recovery.  Blood pressure is well controlled Plan to see the back patient back in several weeks with a follow-up CTA of the chest abdomen and pelvis,     Delight OvensEdward B Altheia Shafran MD      [address] [place] [id number] Office [phone]   Beeper [phone]  02/22/2017 4:18 PM

## 2017-02-22 NOTE — Telephone Encounter (Signed)
New Message   pt verbalized that he is returning call for rn   For results

## 2017-02-23 ENCOUNTER — Other Ambulatory Visit: Payer: Self-pay | Admitting: *Deleted

## 2017-02-23 DIAGNOSIS — Z9889 Other specified postprocedural states: Principal | ICD-10-CM

## 2017-02-23 DIAGNOSIS — Z8679 Personal history of other diseases of the circulatory system: Secondary | ICD-10-CM

## 2017-02-24 ENCOUNTER — Encounter: Payer: Self-pay | Admitting: Cardiothoracic Surgery

## 2017-02-24 DIAGNOSIS — G9511 Acute infarction of spinal cord (embolic) (nonembolic): Secondary | ICD-10-CM

## 2017-02-24 HISTORY — DX: Acute infarction of spinal cord (embolic) (nonembolic): G95.11

## 2017-03-07 ENCOUNTER — Emergency Department (HOSPITAL_COMMUNITY): Payer: Medicaid Other

## 2017-03-07 ENCOUNTER — Encounter (HOSPITAL_COMMUNITY)
Admission: EM | Disposition: A | Discharge status: Rehabilitation Facility | Payer: Self-pay | Source: Home / Self Care | Attending: Internal Medicine

## 2017-03-07 ENCOUNTER — Inpatient Hospital Stay (HOSPITAL_COMMUNITY): Payer: Medicaid Other

## 2017-03-07 ENCOUNTER — Emergency Department (HOSPITAL_COMMUNITY): Payer: Medicaid Other | Admitting: Anesthesiology

## 2017-03-07 ENCOUNTER — Other Ambulatory Visit: Payer: Self-pay

## 2017-03-07 ENCOUNTER — Inpatient Hospital Stay (HOSPITAL_COMMUNITY)
Admission: EM | Admit: 2017-03-07 | Discharge: 2017-03-15 | DRG: 270 | Disposition: A | Discharge status: Rehabilitation Facility | Payer: Medicaid Other | Attending: Internal Medicine | Admitting: Internal Medicine

## 2017-03-07 ENCOUNTER — Encounter (HOSPITAL_COMMUNITY): Payer: Self-pay | Admitting: Emergency Medicine

## 2017-03-07 DIAGNOSIS — D72829 Elevated white blood cell count, unspecified: Secondary | ICD-10-CM | POA: Diagnosis present

## 2017-03-07 DIAGNOSIS — Z9889 Other specified postprocedural states: Secondary | ICD-10-CM

## 2017-03-07 DIAGNOSIS — Z7982 Long term (current) use of aspirin: Secondary | ICD-10-CM

## 2017-03-07 DIAGNOSIS — I959 Hypotension, unspecified: Secondary | ICD-10-CM | POA: Diagnosis present

## 2017-03-07 DIAGNOSIS — E872 Acidosis: Secondary | ICD-10-CM | POA: Diagnosis present

## 2017-03-07 DIAGNOSIS — I739 Peripheral vascular disease, unspecified: Secondary | ICD-10-CM | POA: Diagnosis present

## 2017-03-07 DIAGNOSIS — G8929 Other chronic pain: Secondary | ICD-10-CM | POA: Diagnosis present

## 2017-03-07 DIAGNOSIS — F40240 Claustrophobia: Secondary | ICD-10-CM | POA: Diagnosis present

## 2017-03-07 DIAGNOSIS — K4090 Unilateral inguinal hernia, without obstruction or gangrene, not specified as recurrent: Secondary | ICD-10-CM | POA: Diagnosis present

## 2017-03-07 DIAGNOSIS — W06XXXA Fall from bed, initial encounter: Secondary | ICD-10-CM | POA: Diagnosis present

## 2017-03-07 DIAGNOSIS — I5032 Chronic diastolic (congestive) heart failure: Secondary | ICD-10-CM

## 2017-03-07 DIAGNOSIS — G8220 Paraplegia, unspecified: Secondary | ICD-10-CM

## 2017-03-07 DIAGNOSIS — I7100 Dissection of unspecified site of aorta: Secondary | ICD-10-CM | POA: Diagnosis present

## 2017-03-07 DIAGNOSIS — I10 Essential (primary) hypertension: Secondary | ICD-10-CM | POA: Diagnosis present

## 2017-03-07 DIAGNOSIS — I2510 Atherosclerotic heart disease of native coronary artery without angina pectoris: Secondary | ICD-10-CM | POA: Diagnosis present

## 2017-03-07 DIAGNOSIS — I9789 Other postprocedural complications and disorders of the circulatory system, not elsewhere classified: Secondary | ICD-10-CM | POA: Diagnosis present

## 2017-03-07 DIAGNOSIS — I7101 Dissection of thoracic aorta: Secondary | ICD-10-CM

## 2017-03-07 DIAGNOSIS — R680 Hypothermia, not associated with low environmental temperature: Secondary | ICD-10-CM | POA: Diagnosis present

## 2017-03-07 DIAGNOSIS — D509 Iron deficiency anemia, unspecified: Secondary | ICD-10-CM | POA: Diagnosis present

## 2017-03-07 DIAGNOSIS — E785 Hyperlipidemia, unspecified: Secondary | ICD-10-CM | POA: Diagnosis present

## 2017-03-07 DIAGNOSIS — I129 Hypertensive chronic kidney disease with stage 1 through stage 4 chronic kidney disease, or unspecified chronic kidney disease: Secondary | ICD-10-CM | POA: Diagnosis present

## 2017-03-07 DIAGNOSIS — R292 Abnormal reflex: Secondary | ICD-10-CM

## 2017-03-07 DIAGNOSIS — R29898 Other symptoms and signs involving the musculoskeletal system: Secondary | ICD-10-CM | POA: Diagnosis present

## 2017-03-07 DIAGNOSIS — Z87891 Personal history of nicotine dependence: Secondary | ICD-10-CM

## 2017-03-07 DIAGNOSIS — G9511 Acute infarction of spinal cord (embolic) (nonembolic): Secondary | ICD-10-CM | POA: Diagnosis present

## 2017-03-07 DIAGNOSIS — I82B12 Acute embolism and thrombosis of left subclavian vein: Secondary | ICD-10-CM | POA: Diagnosis present

## 2017-03-07 DIAGNOSIS — Z79899 Other long term (current) drug therapy: Secondary | ICD-10-CM | POA: Diagnosis not present

## 2017-03-07 DIAGNOSIS — Y838 Other surgical procedures as the cause of abnormal reaction of the patient, or of later complication, without mention of misadventure at the time of the procedure: Secondary | ICD-10-CM | POA: Diagnosis present

## 2017-03-07 DIAGNOSIS — N182 Chronic kidney disease, stage 2 (mild): Secondary | ICD-10-CM | POA: Diagnosis present

## 2017-03-07 DIAGNOSIS — N289 Disorder of kidney and ureter, unspecified: Secondary | ICD-10-CM

## 2017-03-07 DIAGNOSIS — F430 Acute stress reaction: Secondary | ICD-10-CM

## 2017-03-07 DIAGNOSIS — W19XXXA Unspecified fall, initial encounter: Secondary | ICD-10-CM

## 2017-03-07 LAB — COMPREHENSIVE METABOLIC PANEL
ALBUMIN: 3.5 g/dL (ref 3.5–5.0)
ALK PHOS: 92 U/L (ref 38–126)
ALT: 12 U/L — ABNORMAL LOW (ref 17–63)
ANION GAP: 12 (ref 5–15)
AST: 19 U/L (ref 15–41)
BILIRUBIN TOTAL: 1.1 mg/dL (ref 0.3–1.2)
BUN: 16 mg/dL (ref 6–20)
CALCIUM: 8.7 mg/dL — AB (ref 8.9–10.3)
CO2: 19 mmol/L — ABNORMAL LOW (ref 22–32)
Chloride: 108 mmol/L (ref 101–111)
Creatinine, Ser: 1.49 mg/dL — ABNORMAL HIGH (ref 0.61–1.24)
GFR calc Af Amer: 57 mL/min — ABNORMAL LOW (ref >60)
GFR, EST NON AFRICAN AMERICAN: 49 mL/min — AB (ref >60)
GLUCOSE: 117 mg/dL — AB (ref 65–99)
Potassium: 3.8 mmol/L (ref 3.5–5.1)
Sodium: 139 mmol/L (ref 135–145)
TOTAL PROTEIN: 6.9 g/dL (ref 6.5–8.1)

## 2017-03-07 LAB — CBC WITH DIFFERENTIAL/PLATELET
Basophils Absolute: 0 K/uL (ref 0.0–0.1)
Basophils Relative: 0 %
Eosinophils Absolute: 0.2 K/uL (ref 0.0–0.7)
Eosinophils Relative: 2 %
HCT: 40.8 % (ref 39.0–52.0)
Hemoglobin: 11.9 g/dL — ABNORMAL LOW (ref 13.0–17.0)
Lymphocytes Relative: 27 %
Lymphs Abs: 3 K/uL (ref 0.7–4.0)
MCH: 25.4 pg — ABNORMAL LOW (ref 26.0–34.0)
MCHC: 29.2 g/dL — ABNORMAL LOW (ref 30.0–36.0)
MCV: 87.2 fL (ref 78.0–100.0)
Monocytes Absolute: 0.5 K/uL (ref 0.1–1.0)
Monocytes Relative: 4 %
Neutro Abs: 7.3 K/uL (ref 1.7–7.7)
Neutrophils Relative %: 67 %
Platelets: 236 K/uL (ref 150–400)
RBC: 4.68 MIL/uL (ref 4.22–5.81)
RDW: 14.9 % (ref 11.5–15.5)
WBC: 11.1 K/uL — ABNORMAL HIGH (ref 4.0–10.5)

## 2017-03-07 LAB — URINALYSIS, ROUTINE W REFLEX MICROSCOPIC
Bilirubin Urine: NEGATIVE
Glucose, UA: NEGATIVE mg/dL
Hgb urine dipstick: NEGATIVE
Ketones, ur: NEGATIVE mg/dL
Leukocytes, UA: NEGATIVE
NITRITE: NEGATIVE
Protein, ur: NEGATIVE mg/dL
SPECIFIC GRAVITY, URINE: 1.042 — AB (ref 1.005–1.030)
pH: 5 (ref 5.0–8.0)

## 2017-03-07 LAB — I-STAT CG4 LACTIC ACID, ED: Lactic Acid, Venous: 2.13 mmol/L (ref 0.5–1.9)

## 2017-03-07 LAB — LACTIC ACID, PLASMA: LACTIC ACID, VENOUS: 2.6 mmol/L — AB (ref 0.5–1.9)

## 2017-03-07 SURGERY — RADIOLOGY WITH ANESTHESIA
Anesthesia: General

## 2017-03-07 MED ORDER — LOSARTAN POTASSIUM 50 MG PO TABS
25.0000 mg | ORAL_TABLET | Freq: Every day | ORAL | Status: DC
  Administered 2017-03-08 – 2017-03-15 (×8): 25 mg via ORAL
  Filled 2017-03-07 (×8): qty 1

## 2017-03-07 MED ORDER — ALUM & MAG HYDROXIDE-SIMETH 200-200-20 MG/5ML PO SUSP: 15.0000 mL | ORAL | Status: DC | PRN

## 2017-03-07 MED ORDER — HYDRALAZINE HCL 20 MG/ML IJ SOLN: 5.0000 mg | INTRAMUSCULAR | Status: DC | PRN

## 2017-03-07 MED ORDER — HYDROMORPHONE HCL 1 MG/ML IJ SOLN
1.0000 mg | Freq: Once | INTRAMUSCULAR | Status: AC
  Administered 2017-03-07: 1 mg via INTRAVENOUS
  Filled 2017-03-07: qty 1

## 2017-03-07 MED ORDER — LORAZEPAM 2 MG/ML IJ SOLN
1.0000 mg | Freq: Once | INTRAMUSCULAR | Status: AC | PRN
  Administered 2017-03-08: 2 mg via INTRAVENOUS
  Filled 2017-03-07: qty 1

## 2017-03-07 MED ORDER — HYDROCODONE-ACETAMINOPHEN 5-325 MG PO TABS
1.0000 | ORAL_TABLET | ORAL | Status: DC | PRN
  Administered 2017-03-07 – 2017-03-09 (×5): 2 via ORAL
  Administered 2017-03-09: 1 via ORAL
  Administered 2017-03-09 (×2): 2 via ORAL
  Administered 2017-03-09: 1 via ORAL
  Administered 2017-03-10 – 2017-03-12 (×10): 2 via ORAL
  Administered 2017-03-12: 1 via ORAL
  Administered 2017-03-13 – 2017-03-14 (×7): 2 via ORAL
  Administered 2017-03-14: 1 via ORAL
  Administered 2017-03-14 – 2017-03-15 (×3): 2 via ORAL
  Administered 2017-03-15: 1 via ORAL
  Filled 2017-03-07 (×2): qty 2
  Filled 2017-03-07 (×2): qty 1
  Filled 2017-03-07: qty 2
  Filled 2017-03-07: qty 1
  Filled 2017-03-07 (×7): qty 2
  Filled 2017-03-07: qty 1
  Filled 2017-03-07 (×9): qty 2
  Filled 2017-03-07: qty 1
  Filled 2017-03-07 (×10): qty 2

## 2017-03-07 MED ORDER — ASPIRIN EC 81 MG PO TBEC
81.0000 mg | DELAYED_RELEASE_TABLET | Freq: Every day | ORAL | Status: DC
  Administered 2017-03-08 – 2017-03-15 (×8): 81 mg via ORAL
  Filled 2017-03-07 (×8): qty 1

## 2017-03-07 MED ORDER — SODIUM CHLORIDE 0.9 % IV BOLUS (SEPSIS)
500.0000 mL | Freq: Once | INTRAVENOUS | Status: AC
  Administered 2017-03-07: 500 mL via INTRAVENOUS

## 2017-03-07 MED ORDER — ONDANSETRON HCL 4 MG/2ML IJ SOLN: 4.0000 mg | Freq: Four times a day (QID) | INTRAMUSCULAR | Status: DC | PRN

## 2017-03-07 MED ORDER — ACETAMINOPHEN 650 MG RE SUPP: 650.0000 mg | Freq: Four times a day (QID) | RECTAL | Status: DC | PRN

## 2017-03-07 MED ORDER — DOCUSATE SODIUM 100 MG PO CAPS
100.0000 mg | ORAL_CAPSULE | Freq: Every day | ORAL | Status: DC
  Administered 2017-03-08 – 2017-03-15 (×8): 100 mg via ORAL
  Filled 2017-03-07 (×8): qty 1

## 2017-03-07 MED ORDER — LABETALOL HCL 5 MG/ML IV SOLN: 10.0000 mg | INTRAVENOUS | Status: DC | PRN

## 2017-03-07 MED ORDER — LORAZEPAM 2 MG/ML IJ SOLN: 1.0000 mg | Freq: Once | INTRAMUSCULAR | Status: DC

## 2017-03-07 MED ORDER — FENTANYL CITRATE (PF) 100 MCG/2ML IJ SOLN
100.0000 ug | Freq: Once | INTRAMUSCULAR | Status: AC
  Administered 2017-03-07: 100 ug via INTRAVENOUS

## 2017-03-07 MED ORDER — FENTANYL CITRATE (PF) 100 MCG/2ML IJ SOLN
INTRAMUSCULAR | Status: AC
  Administered 2017-03-07: 100 ug via INTRAVENOUS
  Filled 2017-03-07: qty 2

## 2017-03-07 MED ORDER — ONDANSETRON HCL 4 MG PO TABS
4.0000 mg | ORAL_TABLET | Freq: Four times a day (QID) | ORAL | Status: DC | PRN
  Administered 2017-03-10 – 2017-03-12 (×2): 4 mg via ORAL
  Filled 2017-03-07 (×2): qty 1

## 2017-03-07 MED ORDER — HYDROMORPHONE HCL 1 MG/ML IJ SOLN
0.5000 mg | INTRAMUSCULAR | Status: DC | PRN
  Administered 2017-03-07 – 2017-03-10 (×4): 0.5 mg via INTRAVENOUS
  Filled 2017-03-07 (×4): qty 0.5

## 2017-03-07 MED ORDER — SENNOSIDES-DOCUSATE SODIUM 8.6-50 MG PO TABS
1.0000 | ORAL_TABLET | Freq: Every evening | ORAL | Status: DC | PRN
  Administered 2017-03-11: 1 via ORAL
  Filled 2017-03-07: qty 1

## 2017-03-07 MED ORDER — BISACODYL 10 MG RE SUPP: 10.0000 mg | Freq: Every day | RECTAL | Status: DC | PRN

## 2017-03-07 MED ORDER — GUAIFENESIN-DM 100-10 MG/5ML PO SYRP: 15.0000 mL | ORAL_SOLUTION | ORAL | Status: DC | PRN

## 2017-03-07 MED ORDER — PANTOPRAZOLE SODIUM 40 MG PO TBEC
40.0000 mg | DELAYED_RELEASE_TABLET | Freq: Every day | ORAL | Status: DC
  Administered 2017-03-07 – 2017-03-15 (×9): 40 mg via ORAL
  Filled 2017-03-07 (×10): qty 1

## 2017-03-07 MED ORDER — ACETAMINOPHEN 325 MG PO TABS
650.0000 mg | ORAL_TABLET | Freq: Four times a day (QID) | ORAL | Status: DC | PRN
  Administered 2017-03-12 – 2017-03-15 (×5): 650 mg via ORAL
  Filled 2017-03-07 (×6): qty 2

## 2017-03-07 MED ORDER — FENTANYL CITRATE (PF) 100 MCG/2ML IJ SOLN: 25.0000 ug | INTRAMUSCULAR | Status: DC | PRN

## 2017-03-07 MED ORDER — METOPROLOL TARTRATE 12.5 MG HALF TABLET
12.5000 mg | ORAL_TABLET | Freq: Two times a day (BID) | ORAL | Status: DC
  Administered 2017-03-08 – 2017-03-15 (×14): 12.5 mg via ORAL
  Filled 2017-03-07 (×15): qty 1

## 2017-03-07 MED ORDER — ONDANSETRON HCL 4 MG/2ML IJ SOLN
4.0000 mg | Freq: Once | INTRAMUSCULAR | Status: AC
  Administered 2017-03-07: 4 mg via INTRAVENOUS
  Filled 2017-03-07: qty 2

## 2017-03-07 MED ORDER — MIDAZOLAM HCL 2 MG/2ML IJ SOLN
2.0000 mg | Freq: Once | INTRAMUSCULAR | Status: AC
  Administered 2017-03-07: 2 mg via INTRAVENOUS

## 2017-03-07 MED ORDER — PROMETHAZINE HCL 25 MG/ML IJ SOLN: 6.2500 mg | INTRAMUSCULAR | Status: DC | PRN

## 2017-03-07 MED ORDER — PHENOL 1.4 % MT LIQD: 1.0000 | OROMUCOSAL | Status: DC | PRN

## 2017-03-07 MED ORDER — METOPROLOL TARTRATE 5 MG/5ML IV SOLN: 2.0000 mg | INTRAVENOUS | Status: DC | PRN

## 2017-03-07 MED ORDER — IOPAMIDOL (ISOVUE-370) INJECTION 76%
INTRAVENOUS | Status: AC
  Administered 2017-03-07: 100 mL
  Filled 2017-03-07: qty 100

## 2017-03-07 MED ORDER — MIDAZOLAM HCL 2 MG/2ML IJ SOLN
INTRAMUSCULAR | Status: AC
  Administered 2017-03-07: 2 mg via INTRAVENOUS
  Filled 2017-03-07: qty 2

## 2017-03-07 MED ORDER — SODIUM CHLORIDE 0.9 % IV BOLUS (SEPSIS): 1000.0000 mL | Freq: Once | INTRAVENOUS | Status: DC

## 2017-03-07 MED ORDER — PROPOFOL 10 MG/ML IV BOLUS
INTRAVENOUS | Status: AC
  Filled 2017-03-07: qty 20

## 2017-03-07 MED ORDER — SODIUM CHLORIDE 0.9 % IV SOLN
INTRAVENOUS | Status: DC
  Administered 2017-03-07 – 2017-03-10 (×4): via INTRAVENOUS

## 2017-03-07 MED ORDER — SODIUM CHLORIDE 0.9 % IV SOLN
1000.0000 mg | Freq: Once | INTRAVENOUS | Status: AC
  Administered 2017-03-07: 1000 mg via INTRAVENOUS
  Filled 2017-03-07: qty 8

## 2017-03-07 MED ORDER — HYDROMORPHONE HCL 1 MG/ML IJ SOLN
1.0000 mg | Freq: Once | INTRAMUSCULAR | Status: AC
  Administered 2017-03-07: 1 mg via INTRAVENOUS
  Filled 2017-03-07 (×2): qty 1

## 2017-03-07 MED ORDER — LORAZEPAM 2 MG/ML IJ SOLN
2.0000 mg | Freq: Once | INTRAMUSCULAR | Status: DC
  Filled 2017-03-07: qty 1

## 2017-03-07 NOTE — ED Notes (Signed)
Received report from Sherrilyn RistKari, RN-- pt is alert/oriented x 4-- pt is unable to pick legs up, move legs independently. Does have sensation to lower legs, is able to dorsiflex both ankles/feet.  BP 80/61-- informed Dr. Blinda LeatherwoodPollina -- NS 500cc bolus started-- pt transported to MRI

## 2017-03-07 NOTE — Consult Note (Signed)
Seen and examined. Exam reveals flaccid lower extremities with sensory impairment to all modalities below T10 bilaterallly. 3+ reflexes with normal strength and coordination to upper ext. 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   Plan: STAT MRI thoracic spine. Include post contrast images of lumbar spine. Start empiric IV solumedrol 1000 mg x 1. Discussed with Dr. Blinda LeatherwoodPollina.  Full consult note to follow.   Electronically signed: Dr. Caryl PinaEric Kentavius Dettore

## 2017-03-07 NOTE — ED Notes (Signed)
Paper consent obtained, still needing MD signature. Preparing to transport pt to short stay.

## 2017-03-07 NOTE — Anesthesia Preprocedure Evaluation (Deleted)
Anesthesia Evaluation  Patient identified by MRN, date of birth, ID band Patient awake    Reviewed: Allergy & Precautions, NPO status , Patient's Chart, lab work & pertinent test results, reviewed documented beta blocker date and time   Airway Mallampati: II       Dental  (+) Poor Dentition, Loose, Chipped, Missing   Pulmonary pneumonia, former smoker,    Pulmonary exam normal breath sounds clear to auscultation       Cardiovascular hypertension, Pt. on medications and Pt. on home beta blockers + CAD and + Peripheral Vascular Disease  Normal cardiovascular exam Rhythm:Regular Rate:Normal  S/P Thoracic aorta dissection - repaired   Neuro/Psych Paralysis lower extremities negative psych ROS   GI/Hepatic negative GI ROS, Neg liver ROS,   Endo/Other  Hyperlipidemia  Renal/GU negative Renal ROS  negative genitourinary   Musculoskeletal negative musculoskeletal ROS (+)   Abdominal   Peds  Hematology  (+) anemia ,   Anesthesia Other Findings   Reproductive/Obstetrics                             Anesthesia Physical Anesthesia Plan  ASA: III  Anesthesia Plan: General   Post-op Pain Management:    Induction: Intravenous  PONV Risk Score and Plan: Propofol infusion, Ondansetron, Promethazine and Treatment may vary due to age or medical condition  Airway Management Planned: LMA  Additional Equipment:   Intra-op Plan:   Post-operative Plan: Extubation in OR  Informed Consent: I have reviewed the patients History and Physical, chart, labs and discussed the procedure including the risks, benefits and alternatives for the proposed anesthesia with the patient or authorized representative who has indicated his/her understanding and acceptance.   Dental advisory given  Plan Discussed with: CRNA, Anesthesiologist and Surgeon  Anesthesia Plan Comments:         Anesthesia Quick  Evaluation

## 2017-03-07 NOTE — ED Provider Notes (Addendum)
[facility]   CSN: [id number] Arrival date & time: 03/07/17  0303     History   Chief Complaint Chief Complaint  Patient presents with  . Fall    HPI Victor [name] is a 61 y.o. male.  Patient brought to the emergency department from home.  Patient reported a fall prior to EMS arrival.  Circumstances are unclear.  He tells me that he got dizzy and this caused him to fall.  He thinks he took "too much pain medicine".  He told EMS, however, that when he tried to stand up his legs gave out and he fell directly to the floor.  Patient reports that he has been experiencing low back pain for some time.  It appears that he has chronic low back pain, but his pain has been worsening this week.  He reports that he had increasing pain tonight prior to the fall, but now the pain is severe since he fell.  Since the fall he reports that he cannot feel his legs or move them.      Past Medical History:  Diagnosis Date  . History of kidney stones   . Hypertension   . Thoracic aortic aneurysm (HCC) 09/2014    Patient Active Problem List   Diagnosis Date Noted  . S/P thoracic aortic aneurysm repair 01/21/2017  . Coronary artery calcification 12/23/2016  . S/P aortic dissection repair 12/23/2016  . Thoracic aortic aneurysm without rupture (HCC) 12/23/2016  . Preoperative cardiovascular examination 12/23/2016  . Essential hypertension 09/23/2015  . Iron deficiency anemia 01/28/2015  . Weight loss 10/30/2014  . Hospital-acquired pneumonia 10/09/2014  . Ischemic leg 10/09/2014  . Aortic dissection (HCC) 10/07/2014    Past Surgical History:  Procedure Laterality Date  . CARDIAC CATHETERIZATION         Home Medications    Prior to Admission medications   Medication Sig Start Date End Date Taking? Authorizing Provider  amiodarone (PACERONE) 200 MG tablet Please take 2 tabs twice a day for 2 days and then take one tab twice a day  until we see you in the office. 01/28/17   Rowe ClackGold, Wayne E, PA-C  aspirin 81 MG EC tablet Take 1 tablet (81 mg total) by mouth daily. 01/28/17   Gold, Glenice LaineWayne E, PA-C  chlorproMAZINE (THORAZINE) 10 MG tablet Take 1 tablet (10 mg total) by mouth every 8 (eight) hours as needed (hiccups). 01/28/17   Gold, Wayne E, PA-C  losartan (COZAAR) 25 MG tablet Take 1 tablet (25 mg total) by mouth daily. 01/29/17   Gold, Deniece PortelaWayne E, PA-C  metoprolol tartrate (LOPRESSOR) 25 MG tablet Take 0.5 tablets (12.5 mg total) by mouth 2 (two) times daily. 01/28/17   Gold, Wayne E, PA-C  oxyCODONE 10 MG TABS Take 1 tablet (10 mg total) by mouth every 6 (six) hours as needed for severe pain. 01/28/17   Rowe ClackGold, Wayne E, PA-C    Family History Family History  Problem Relation Age of Onset  . Heart murmur Mother     Social History Social History   Tobacco Use  . Smoking status: Former Smoker    Years: 35.00    Types: Cigarettes, Cigars    Last attempt to quit: 10/07/2014    Years since quitting: 2.4  . Smokeless tobacco: Never Used  Substance Use Topics  . Alcohol use: No    Alcohol/week: 0.0 oz  . Drug use: No     Allergies   Patient has  no known allergies.   Review of Systems Review of Systems  Musculoskeletal: Positive for back pain.  Neurological: Positive for weakness and numbness.  All other systems reviewed and are negative.    Physical Exam Updated Vital Signs BP 96/67   Pulse 73   Temp (!) 95.4 F (35.2 C) (Rectal) Comment: warm blankets given  Resp 17   Ht 5\' 7"  (1.702 m)   Wt 69.9 kg (154 lb)   SpO2 100%   BMI 24.12 kg/m   Physical Exam  Constitutional: He is oriented to person, place, and time. He appears well-developed and well-nourished. No distress.  HENT:  Head: Normocephalic and atraumatic.  Right Ear: Hearing normal.  Left Ear: Hearing normal.  Nose: Nose normal.  Mouth/Throat: Oropharynx is clear and moist and mucous membranes are normal.  Eyes: Conjunctivae and EOM are normal.  Pupils are equal, round, and reactive to light.  Neck: Normal range of motion. Neck supple.  Cardiovascular: Regular rhythm, S1 normal and S2 normal. Exam reveals no gallop and no friction rub.  No murmur heard. Pulses:      Femoral pulses are 2+ on the right side, and 2+ on the left side.      Dorsalis pedis pulses are 2+ on the right side, and 2+ on the left side.  Pulmonary/Chest: Effort normal and breath sounds normal. No respiratory distress. He exhibits no tenderness.  Abdominal: Soft. Normal appearance and bowel sounds are normal. There is no hepatosplenomegaly. There is no tenderness. There is no rebound, no guarding, no tenderness at McBurney's point and negative Murphy's sign. No hernia.  Genitourinary: Rectal exam shows anal tone abnormal (decreased).  Musculoskeletal: Normal range of motion.       Lumbar back: He exhibits tenderness and bony tenderness.  Neurological: He is alert and oriented to person, place, and time. No cranial nerve deficit or sensory deficit (bilateral LE). He exhibits abnormal muscle tone (bilateral LE). GCS eye subscore is 4. GCS verbal subscore is 5. GCS motor subscore is 6.  Strength of bilateral LE is 0/5 with decreased sensation to light touch. Decreased rectal tone.  Skin: Skin is warm, dry and intact. No rash noted. No cyanosis.  Psychiatric: He has a normal mood and affect. His speech is normal and behavior is normal. Thought content normal.  Nursing note and vitals reviewed.    ED Treatments / Results  Labs (all labs ordered are listed, but only abnormal results are displayed) Labs Reviewed  CBC WITH DIFFERENTIAL/PLATELET - Abnormal; Notable for the following components:      Result Value   WBC 11.1 (*)    Hemoglobin 11.9 (*)    MCH 25.4 (*)    MCHC 29.2 (*)    All other components within normal limits  COMPREHENSIVE METABOLIC PANEL - Abnormal; Notable for the following components:   CO2 19 (*)    Glucose, Bld 117 (*)    Creatinine, Ser  1.49 (*)    Calcium 8.7 (*)    ALT 12 (*)    GFR calc non Af Amer 49 (*)    GFR calc Af Amer 57 (*)    All other components within normal limits    EKG  EKG Interpretation  Date/Time:  Monday March 07 2017 03:18:25 EST Ventricular Rate:  66 PR Interval:    QRS Duration: 89 QT Interval:  442 QTC Calculation: 464 R Axis:   46 Text Interpretation:  Sinus rhythm Borderline prolonged PR interval Left atrial enlargement Confirmed by Jaci CarrelPollina, Andersyn Fragoso  J ([id number]) on 03/07/2017 3:23:13 AM       Radiology Dg Chest 2 View  Result Date: 03/07/2017 CLINICAL DATA:  Unwitnessed fall, dizziness. Decreased sensation in extremities. EXAM: CHEST  2 VIEW COMPARISON:  Chest radiograph February 22, 2017 FINDINGS: Cardiac silhouette is mildly enlarged. Re- demonstration of thoracic aortic aneurysm, status post stent graft repair and median sternotomy. Minimal RIGHT lung base scarring. Mild chronic bronchitic changes without pleural effusion or focal consolidation. No pneumothorax. Old LEFT rib fractures. IMPRESSION: Mild cardiomegaly.  No acute pulmonary process. Re- demonstration of thoracic aortic aneurysm and stent graft repair. Electronically Signed   By: Awilda Metro M.D.   On: 03/07/2017 06:11   Dg Thoracic Spine 2 View  Result Date: 03/07/2017 CLINICAL DATA:  Dizziness, unwitnessed fall. EXAM: THORACIC SPINE 2 VIEWS COMPARISON:  Chest radiograph February 22, 2017 FINDINGS: There is no evidence of thoracic spine fracture. Alignment is normal. No other significant bone abnormalities are identified. Thoracic aorta stent graft. IMPRESSION: No acute fracture deformity or malalignment. Electronically Signed   By: Awilda Metro M.D.   On: 03/07/2017 06:08   Dg Lumbar Spine Complete  Result Date: 03/07/2017 CLINICAL DATA:  Larey Seat from bed.  Low back pain this morning. EXAM: LUMBAR SPINE - COMPLETE 4+ VIEW COMPARISON:  CT abdomen and pelvis December 09, 2016 FINDINGS: Five non rib-bearing  lumbar-type vertebral bodies are intact scratch with maintenance of the lumbar lordosis. Stable minimal grade 1 L5-S1 retrolisthesis on degenerative basis. Moderate L5-S1 facet arthropathy. Intervertebral disc heights are normal. No destructive bony lesions. Sacroiliac joints are symmetric. Included prevertebral and paraspinal soft tissue planes are non-suspicious. Included chest demonstrates descending thoracic stent graft. IMPRESSION: No acute fracture deformity. Similar minimal grade 1 L5-S1 anterolisthesis on degenerative basis. Electronically Signed   By: Awilda Metro M.D.   On: 03/07/2017 04:11   Ct Cervical Spine Wo Contrast  Result Date: 03/07/2017 CLINICAL DATA:  61 year old male with trauma. EXAM: CT CERVICAL SPINE WITHOUT CONTRAST TECHNIQUE: Multidetector CT imaging of the cervical spine was performed without intravenous contrast. Multiplanar CT image reconstructions were also generated. COMPARISON:  Chest CT dated 12/09/2016 FINDINGS: Evaluation of this exam is limited due to motion artifact. Alignment: No acute subluxation. Skull base and vertebrae: No acute fracture. No primary bone lesion or focal pathologic process. There is incomplete bony fusion of posterior ring of C1. Soft tissues and spinal canal: No prevertebral fluid or swelling. No visible canal hematoma. Disc levels: There are degenerative changes most prominent at C6-C7 where there is endplate irregularity and disc space narrowing. Upper chest: There is a nondisplaced fracture of the right first rib at the costovertebral junction. This fracture is age indeterminate but appears new compared to the CT of 12/09/2016 and is suspicious for an acute fracture. Clinical correlation is recommended. There is biapical paraseptal emphysema. Other: None IMPRESSION: 1. No definite acute/traumatic cervical spine pathology. Evaluation however is limited due to motion artifact. 2. Nondisplaced fracture of the right first rib at the costovertebral  junction, possibly acute. Clinical correlation is recommended. Electronically Signed   By: Elgie Collard M.D.   On: 03/07/2017 04:12   Mr Lumbar Spine Wo Contrast  Result Date: 03/07/2017 CLINICAL DATA:  Decreased sensation and lower extremity weakness. Fall. Neurological decline. EXAM: MRI LUMBAR SPINE WITHOUT CONTRAST TECHNIQUE: Multiplanar, multisequence MR imaging of the lumbar spine was performed. No intravenous contrast was administered. COMPARISON:  Lumbar spine radiographs March 07, 2017 at 0347 hours and CT chest, abdomen and pelvis December 09, 2016 FINDINGS:  SEGMENTATION: For the purposes of this report, the last well-formed intervertebral disc will be reported as L5-S1. ALIGNMENT: Maintained lumbar lordosis. Minimal grade 1 L5-S1 anterolisthesis. Chronic LEFT L5 pars interarticularis defect. Minimal grade 1 L2-3 retrolisthesis. VERTEBRAE:Vertebral bodies are intact. Mild L2-3 and minimal L3-4 disc height loss with mild desiccation. Mild chronic discogenic endplate changes L2-3. No abnormal or acute bone marrow signal. CONUS MEDULLARIS: Conus medullaris terminates at T12-L1 and demonstrates normal morphology and signal characteristics. Cauda equina is normal. PARASPINAL AND SOFT TISSUES: Nonacute. Complex infrarenal aortic aneurysm and dissection similar to prior CT given differences in imaging technique. DISC LEVELS: T12-L1, L1-2: No disc bulge, canal stenosis nor neural foraminal narrowing. L2-3: Retrolisthesis. Annular bulging. Small bilateral subarticular to extraforaminal disc protrusions. No canal stenosis. Mild bilateral neural foraminal narrowing. L3-4: Annular bulging. Small broad-based RIGHT extraforaminal disc protrusion encroaching upon the exited LEFT L3 nerve. Minimal facet arthropathy and ligamentum flavum redundancy without canal stenosis. Mild to moderate RIGHT neural foraminal narrowing. L4-5: No disc bulge. Mild facet arthropathy and ligamentum flavum redundancy without canal  stenosis. Mild RIGHT greater than LEFT neural foraminal narrowing. L5-S1: Anterolisthesis. Annular bulging. Severe RIGHT facet arthropathy. No canal stenosis. Mild RIGHT neural foraminal narrowing. IMPRESSION: 1. Minimal grade 1 L5-S1 anterolisthesis. Chronic RIGHT L5 pars interarticularis defect. No acute osseous process. 2. Degenerative change of the lumbar spine. 3. No canal stenosis. Neural foraminal narrowing L2-3 through L5-S1: Mild moderate on the RIGHT at L3-4. Electronically Signed   By: Awilda Metro M.D.   On: 03/07/2017 05:13    Procedures Procedures (including critical care time)  Medications Ordered in ED Medications  HYDROmorphone (DILAUDID) injection 1 mg (0 mg Intravenous Hold 03/07/17 0554)  methylPREDNISolone sodium succinate (SOLU-MEDROL) 1,000 mg in sodium chloride 0.9 % 50 mL IVPB (not administered)  HYDROmorphone (DILAUDID) injection 1 mg (1 mg Intravenous Given 03/07/17 0330)  ondansetron (ZOFRAN) injection 4 mg (4 mg Intravenous Given 03/07/17 0330)  sodium chloride 0.9 % bolus 500 mL (0 mLs Intravenous Stopped 03/07/17 0653)     Initial Impression / Assessment and Plan / ED Course  I have reviewed the triage vital signs and the nursing notes.  Pertinent labs & imaging results that were available during my care of the patient were reviewed by me and considered in my medical decision making (see chart for details).     Patient presents to the emergency department for evaluation after a fall.  Patient is a very poor historian, gives different answers to the same question at different times.  He apparently has been experiencing pain in the lower back with some weakness of the legs for several weeks.  It appears that he tried to get out of bed tonight and his legs gave out and he fell to the floor.  At arrival to the emergency department he reports that he cannot feel his legs.  Examination reveals complete paralysis of the lower extremities.  He does have some  preserved sensation.  As the patient did fall, and it was unclear if he had the weakness prior to the fall, imaging was ordered to rule out injury.  CT cervical spine, plain film x-rays of back were negative.  As the patient had lower back pain prior to the fall, spinal lesion resulting in the weakness was considered as well.  Patient was sent for a lumbar MRI, but this did not show any lesion that would explain his symptoms.  Neurology was then consulted.  Dr. Otelia Limes, has evaluated the patient.  He recommends  sending the patient back for a lumbar with contrast as well as thoracic with and without.  Transverse myelitis is a consideration, will empirically start Solu-Medrol.  Lumbar puncture was initially considered, as Guillain-Barr was considered.  Dr. Otelia Limes did not feel that Reyes Ivan was likely, recommend performing the MRI and holding off on lumbar puncture.  Patient did have thoracic aortic aneurysm repair 2 months ago.  There is no evidence of vascular event of the extremities causing his paralysis.  He has bounding femoral and dorsalis pedal pulses, normal overlying skin, warm extremities without any signs of decreased arterial blood flow.  Based on his aneurysm repair, cannot rule out spinal cord infarct.  Diagnosis of this would be by MRI.  Addendum: Called to MRI to reevaluate the patient.  Patient refusing to undergo further MRI because he is claustrophobic.  I spent 10 minutes in the scanner with the patient discussing this with him.  He has refused Ativan, refused pain medication and flatly refused to get back in the scanner.  I informed him that he might have a fixable cause of his paralysis at this time, but if we do not get a timely MRI he may be permanently paralyzed.  He expresses understanding of this but refuses to go further with any more MRI scanning.  I was unable to convince him in any way to perform the MRI.  The patient will require hospitalization and will attempt MRI  under anesthesia later today.  Will empirically treat with Solu-Medrol as outlined above.  Addendum 2: Discussed with Durenda Age of hospitalist service.  Raises question of possible sepsis.  Patient was hypothermic at arrival and does have a white blood cell count of 11.  Cannot rule out infectious etiology of the spinal cord, will add on blood culture and lactic acid.  Hospitalist service will initiate broad-spectrum empiric antibiotic coverage.  Will admit to the hospitalist service.  Attempt MRI under general anesthesia later today.  CRITICAL CARE Performed by: Gilda Crease   Total critical care time: 35 minutes  Critical care time was exclusive of separately billable procedures and treating other patients.  Critical care was necessary to treat or prevent imminent or life-threatening deterioration.  Critical care was time spent personally by me on the following activities: development of treatment plan with patient and/or surrogate as well as nursing, discussions with consultants, evaluation of patient's response to treatment, examination of patient, obtaining history from patient or surrogate, ordering and performing treatments and interventions, ordering and review of laboratory studies, ordering and review of radiographic studies, pulse oximetry and re-evaluation of patient's condition.   Final Clinical Impressions(s) / ED Diagnoses   Final diagnoses:  Fall  Paralysis of both lower limbs [facility])    ED Discharge Orders    None       Emillie Chasen, Canary Brim, MD 03/07/17 1610    Gilda Crease, MD 03/07/17 [id number]    Gilda Crease, MD 03/07/17 [phone]

## 2017-03-07 NOTE — Progress Notes (Signed)
CRITICAL VALUE ALERT  Critical Value: Lactid Acid  2.6 Date & Time Notied:  03/07/17  Provider Notified: Yes  Orders Received/Actions taken: NO

## 2017-03-07 NOTE — ED Notes (Signed)
To CT-- Dr. Noreene LarssonJoslin from anesthesia in to see.

## 2017-03-07 NOTE — ED Notes (Signed)
Per vascular, cardiothoracic consult to be done before OR decision is made.

## 2017-03-07 NOTE — ED Triage Notes (Signed)
PT BIB [facility] for unwitnessed fall. Pt stated he got dizzy and fell. Felt like his legs went out from under him. Pt states he is having decreased feeling and movement in his legs. Also nauseous on arrival.

## 2017-03-07 NOTE — Consult Note (Signed)
[address]       [place] [address]             [id number]        Victor [name] [facility] Medical Record #[id number] Date of Birth: [birth year]  Referring:  Primary Care: Billee Cashing, MD Cardiology: Dr Rennis Golden  Chief Complaint:    Chief Complaint  Patient presents with  . 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 PROCEDURE PERFORMED: 1. Redo sternotomy. 2. Cardiopulmonary bypass with hypothermic arrest and antegrade     cerebral perfusion. 3. Replacement of aortic arch with elephant trunk into the proximal     descending thoracic aorta with a 30 x 10 x 8 x 8 x 10 Hemashield     graft with 10 mm arm to the innominate, 8 mm arm to the left     carotid, and 8 mm arm to the left subclavian.  4. TEVAR placement antegrade withGORE CTAG 34x10, then a GOE CTAG 37x15, followed by a GORE CTAG 40x15   5. IVUS of the aortic arch and descending thoracic aorta, and upper abdominal aorta, including visceral vessels (celiac, SMA, bilateral renal arteries) 6. Thoracic and abdominal aortogram  10/06/2014 OPERATIVE REPORT PREOPERATIVE DIAGNOSIS: Acute type 1 aortic dissection originating just at the takeoff of the right coronary ostium extending into the right external iliac artery. 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 SURGICAL PROCEDURE: Repair of ascending aortic dissection with replacement of ascending aorta and resuspension of the aortic valve with cardiopulmonary bypass and hypothermic circulatory    About 6 weeks ago  the patient underwent the above procedure for progressive enlargement of distal arch and descending aortic aneurysmal dilatation secondary to chronic aortic dissection originally repaired October 06, 2014.  Patient has been doing well postoperatively last seen in the office about 10 days ago without any neurologic deficit.  He noted staying up late last night watching TV and about 2 AM began having back pain, when he stood to walk he noted his legs were too weak to hold him up.  EMS brought him to the emergency room 3:12 AM.  I was notified of his admission about 1045 while in the OR.  Hearing the story, most consistent with acute spinal cord ischemia.  I asked Dr. Noreene Larsson to see the patient and place an epidural drain to the lower spinal cord pressures and perhaps mitigate the acute ischemia and neurologic deficit.   Drain has been placed.  The patient has intact sensation but both logs are weak he is able to move his toes and feet he can move his right knee to some degree but not the left he has been voiding without a Foley.  He denies any specific pain other than mild low back pain.  Current Activity/ Functional Status: Patient was independent with mobility/ambulation, transfers, ADL's, IADL's.   Zubrod Score: At the time of last visit to office  this patient's most appropriate activity status/level should be described as: []     0    Normal activity, no symptoms [x]     1    Restricted  in physical strenuous activity but ambulatory, able to do out light work []     2    Ambulatory and capable of self care, unable to do work activities, up and about                 more than 50%  Of the time                            []     3    Only limited self care, in bed greater than 50% of waking hours []     4    Completely disabled, no self care, confined to bed or chair []     5    Moribund  Past Medical History:  Diagnosis Date  . History of kidney stones   . Hypertension   . Thoracic aortic aneurysm (HCC)  09/2014    Past Surgical History:  Procedure Laterality Date  . CARDIAC CATHETERIZATION    . s/p thoracic aneurysm repair    . VASCULAR SURGERY     Social History   Tobacco Use  Smoking Status Former Smoker  . Years: 35.00  . Types: Cigarettes, Cigars  . Last attempt to quit: 10/07/2014  . Years since quitting: 2.4  Smokeless Tobacco Never Used    Social History   Substance and Sexual Activity  Alcohol Use No  . Alcohol/week: 0.0 oz    Social History   Socioeconomic History  . Marital status: Single    Spouse name: Not on file  . Number of children: Not on file  . Years of education: Not on file  . Highest education level: Not on file  Social Needs  . Financial resource strain: Not on file  . Food insecurity - worry: Not on file  . Food insecurity - inability: Not on file  . Transportation needs - medical: Not on file  . Transportation needs - non-medical: Not on file  Occupational History  . Not on file  Tobacco Use  . Smoking status: Former Smoker    Years: 35.00    Types: Cigarettes, Cigars    Last attempt to quit: 10/07/2014    Years since quitting: 2.4  . Smokeless tobacco: Never Used  Substance and Sexual Activity  . Alcohol use: No    Alcohol/week: 0.0 oz  . Drug use: No  . Sexual activity: Not on file  Other Topics Concern  . Not on file  Social History Narrative  . Not on file    No Known Allergies  Current Facility-Administered Medications  Medication Dose Route Frequency Provider Last Rate Last Dose  . 0.9 %  sodium chloride infusion   Intravenous Continuous Marcos Eke, PA-C      . [MAR Hold] acetaminophen (TYLENOL) tablet 650 mg  650 mg Oral Q6H PRN Marcos Eke, PA-C       Or  . Mitzi Hansen Hold] acetaminophen (TYLENOL) suppository 650 mg  650 mg Rectal Q6H PRN Marcos Eke, PA-C      . [MAR Hold] aspirin EC tablet 81 mg  81 mg Oral Daily Wertman, Sung Amabile, PA-C      . [MAR Hold] bisacodyl (DULCOLAX) suppository 10 mg  10 mg Rectal  Daily PRN Marcos Eke, PA-C      . [MAR Hold] HYDROcodone-acetaminophen (NORCO/VICODIN) 5-325 MG per tablet 1-2 tablet  1-2 tablet Oral Q4H PRN Marcos Eke, PA-C      . [facility] Hold]  HYDROmorphone (DILAUDID) injection 0.5 mg  0.5 mg Intravenous Q4H PRN Marcos EkeWertman, Sara E, PA-C      . [MAR Hold] HYDROmorphone (DILAUDID) injection 1 mg  1 mg Intravenous Once Pollina, Canary Brimhristopher J, MD   Stopped at 03/07/17 0554  . [MAR Hold] LORazepam (ATIVAN) injection 1-2 mg  1-2 mg Intravenous Once PRN Ozella RocksMerrell, David J, MD      . Mitzi Hansen[MAR Hold] LORazepam (ATIVAN) injection 2 mg  2 mg Intravenous Once Pollina, Canary Brimhristopher J, MD      . Mitzi Hansen[MAR Hold] losartan (COZAAR) tablet 25 mg  25 mg Oral Daily Marcos EkeWertman, Sara E, PA-C      . [MAR Hold] metoprolol tartrate (LOPRESSOR) tablet 12.5 mg  12.5 mg Oral BID Marcos EkeWertman, Sara E, PA-C      . [MAR Hold] ondansetron (ZOFRAN) tablet 4 mg  4 mg Oral Q6H PRN Marcos EkeWertman, Sara E, PA-C       Or  . Mitzi Hansen[MAR Hold] ondansetron (ZOFRAN) injection 4 mg  4 mg Intravenous Q6H PRN Marcos EkeWertman, Sara E, PA-C      . [MAR Hold] senna-docusate (Senokot-S) tablet 1 tablet  1 tablet Oral QHS PRN Marcos EkeWertman, Sara E, PA-C      . [MAR Hold] sodium chloride 0.9 % bolus 1,000 mL  1,000 mL Intravenous Once Ozella RocksMerrell, David J, MD        Medications Prior to Admission  Medication Sig Dispense Refill Last Dose  . aspirin 81 MG EC tablet Take 1 tablet (81 mg total) by mouth daily.   03/06/2017 at Unknown time  . losartan (COZAAR) 25 MG tablet Take 1 tablet (25 mg total) by mouth daily. 30 tablet 1 03/06/2017 at Unknown time  . metoprolol tartrate (LOPRESSOR) 25 MG tablet Take 0.5 tablets (12.5 mg total) by mouth 2 (two) times daily. (Patient taking differently: Take 25 mg 2 (two) times daily by mouth. ) 30 tablet 1 03/06/2017 at 8a  . oxyCODONE 10 MG TABS Take 1 tablet (10 mg total) by mouth every 6 (six) hours as needed for severe pain. 30 tablet 0 03/07/2017 at Unknown time  . amiodarone (PACERONE) 200 MG tablet Please take 2  tabs twice a day for 2 days and then take one tab twice a day until we see you in the office. 70 tablet 1 have not started  . chlorproMAZINE (THORAZINE) 10 MG tablet Take 1 tablet (10 mg total) by mouth every 8 (eight) hours as needed (hiccups). (Patient not taking: Reported on 03/07/2017) 20 tablet 0 Not Taking at Unknown time    Family History  Problem Relation Age of Onset  . Heart murmur Mother      Review of Systems:  Pertinent items are noted in HPI.     Cardiac Review of Systems: Y or N  Chest Pain [  n  ]  Resting SOB [ n  ] Exertional SOB  [ n ]  Orthopnea [  ]   Pedal Edema [   ]    Palpitations [  ] Syncope  [  ]   Presyncope [   ]  General Review of Systems: [Y] = yes [  ]=no Constitional: recent weight change [  ]; anorexia [  ]; fatigue [  ]; nausea [  ]; night sweats [  ]; fever [  ]; or chills [  ]  Dental: poor dentition[  ]; Last Dentist visit:   Eye : blurred vision [  ]; diplopia [   ]; vision changes [  ];  Amaurosis fugax[  ]; Resp: cough [  ];  wheezing[  ];  hemoptysis[  ]; shortness of breath[  ]; paroxysmal nocturnal dyspnea[  ]; dyspnea on exertion[  ]; or orthopnea[  ];  GI:  gallstones[  ], vomiting[  ];  dysphagia[  ]; melena[  ];  hematochezia [  ]; heartburn[  ];   Hx of  Colonoscopy[  ]; GU: kidney stones [  ]; hematuria[  ];   dysuria [  ];  nocturia[  ];  history of     obstruction [  ]; urinary frequency [  ]             Skin: rash, swelling[  ];, hair loss[  ];  peripheral edema[  ];  or itching[  ]; Musculosketetal: myalgias[  ];  joint swelling[  ];  joint erythema[  ];  joint pain[  ];  back pain[  ];  Heme/Lymph: bruising[  ];  bleeding[  ];  anemia[  ];  Neuro: TIA[  ];  headaches[  ];  stroke[  ];  vertigo[  ];  seizures[  ];   paresthesias[  ];  difficulty walking[  ];  Psych:depression[  ]; anxiety[  ];  Endocrine: diabetes[  ];  thyroid dysfunction[  ];  Immunizations: Flu [  ];  Pneumococcal[  ];  Other:  Physical Exam: BP (!) 134/101   Pulse 84   Temp (!) 95.4 F (35.2 C) (Rectal) Comment: warm blankets given  Resp (!) 30   Ht 5\' 7"  (1.702 m)   Wt 154 lb (69.9 kg)   SpO2 100%   BMI 24.12 kg/m  BP left arm 114/77 right arm 135/97   General appearance: alert and cooperative Head: Normocephalic, without obvious abnormality, atraumatic Neck: no adenopathy, no carotid bruit, no JVD, supple, symmetrical, trachea midline and thyroid not enlarged, symmetric, no tenderness/mass/nodules Lymph nodes: Cervical, supraclavicular, and axillary nodes normal. Resp: clear to auscultation bilaterally Back: symmetric, no curvature. ROM normal. No CVA tenderness. Cardio: regular rate and rhythm, S1, S2 normal, no murmur, click, rub or gallop GI: soft, non-tender; bowel sounds normal; no masses,  no organomegaly Extremities: As noted patient has obvious bilateral leg weakness left leg greater than the right, he has palpable pulses at the PT and DP sites bilaterally Neurologic: Patient is awake and alert, with no mental status changes.  As noted both legs are weak He has palpable brachial pulses bilaterally His incisions are well-healed He still has some numbness in his small and ring finger on the right, likely related to brachial stretch from sternotomy and right infraclavicular axillary artery cannulation.  Diagnostic Studies & Laboratory data:     Recent Radiology Findings:   Dg Chest 2 View  Result Date: 03/07/2017 CLINICAL DATA:  Unwitnessed fall, dizziness. Decreased sensation in extremities. EXAM: CHEST  2 VIEW COMPARISON:  Chest radiograph February 22, 2017 FINDINGS: Cardiac silhouette is mildly enlarged. Re- demonstration of thoracic aortic aneurysm, status post stent graft repair and median sternotomy. Minimal RIGHT lung base scarring. Mild chronic bronchitic changes without pleural effusion or focal consolidation. No pneumothorax. Old LEFT rib fractures. IMPRESSION:  Mild cardiomegaly.  No acute pulmonary process. Re- demonstration of thoracic aortic aneurysm and stent graft repair. Electronically Signed   By: Awilda Metro M.D.   On: 03/07/2017 06:11   Dg Thoracic  Spine 2 View  Result Date: 03/07/2017 CLINICAL DATA:  Dizziness, unwitnessed fall. EXAM: THORACIC SPINE 2 VIEWS COMPARISON:  Chest radiograph February 22, 2017 FINDINGS: There is no evidence of thoracic spine fracture. Alignment is normal. No other significant bone abnormalities are identified. Thoracic aorta stent graft. IMPRESSION: No acute fracture deformity or malalignment. Electronically Signed   By: Awilda Metro M.D.   On: 03/07/2017 06:08   Dg Lumbar Spine Complete  Result Date: 03/07/2017 CLINICAL DATA:  Larey Seat from bed.  Low back pain this morning. EXAM: LUMBAR SPINE - COMPLETE 4+ VIEW COMPARISON:  CT abdomen and pelvis December 09, 2016 FINDINGS: Five non rib-bearing lumbar-type vertebral bodies are intact scratch with maintenance of the lumbar lordosis. Stable minimal grade 1 L5-S1 retrolisthesis on degenerative basis. Moderate L5-S1 facet arthropathy. Intervertebral disc heights are normal. No destructive bony lesions. Sacroiliac joints are symmetric. Included prevertebral and paraspinal soft tissue planes are non-suspicious. Included chest demonstrates descending thoracic stent graft. IMPRESSION: No acute fracture deformity. Similar minimal grade 1 L5-S1 anterolisthesis on degenerative basis. Electronically Signed   By: Awilda Metro M.D.   On: 03/07/2017 04:11   Ct Angio Neck W Or Wo Contrast  Result Date: 03/07/2017 CLINICAL DATA:  Concern for spinal cord infarction. Recent aortic dissection and aneurysm repair. EXAM: CT ANGIOGRAPHY NECK TECHNIQUE: Multidetector CT imaging of the neck was performed using the standard protocol during bolus administration of intravenous contrast. Multiplanar CT image reconstructions and MIPs were obtained to evaluate the vascular anatomy. Carotid  stenosis measurements (when applicable) are obtained utilizing NASCET criteria, using the distal internal carotid diameter as the denominator. CONTRAST:  ISOVUE-370 IOPAMIDOL (ISOVUE-370) INJECTION 76% COMPARISON:  None. FINDINGS: Repaired aortic root. Interval stent graft and arch repair with arch debranching including aorto left subclavian bypass. A stent graft partially covers the distal arch, with proximal margin near the left subclavian ostium. The aorto left subclavian graft is severely stenotic at its origin with luminal filling defect consistent with thrombus. The downstream native vessel is smooth and widely patent. The left vertebral artery shows no filling defect, dissection, or stenosis. No stenosis or filling defects seen in the right brachiocephalic or left carotid circulation. Chest:  Reported separately. Neck: No incidental mass or inflammation noted. Remote blowout fracture of the left orbital floor. Critical Value/emergent results were called by telephone at the time of interpretation on 03/07/2017 at 12:54 pm to Dr. Shelly Flatten , who verbally acknowledged these results. IMPRESSION: 1. Interval aortic dissection repair with arch debranching. 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 The other great vessels are widely patent. No embolic disease seen in the neck; no left vertebral stenosis. 2. Chest CTA reported separately. Electronically Signed   By: Marnee Spring M.D.   On: 03/07/2017 13:00   Ct Cervical Spine Wo Contrast  Result Date: 03/07/2017 CLINICAL DATA:  61 year old male with trauma. EXAM: CT CERVICAL SPINE WITHOUT CONTRAST TECHNIQUE: Multidetector CT imaging of the cervical spine was performed without intravenous contrast. Multiplanar CT image reconstructions were also generated. COMPARISON:  Chest CT dated 12/09/2016 FINDINGS: Evaluation of this exam is limited due to motion artifact. Alignment: No acute subluxation. Skull base and  vertebrae: No acute fracture. No primary bone lesion or focal pathologic process. There is incomplete bony fusion of posterior ring of C1. Soft tissues and spinal canal: No prevertebral fluid or swelling. No visible canal hematoma. Disc levels: There are degenerative changes most prominent at C6-C7 where there is endplate irregularity and disc space  narrowing. Upper chest: There is a nondisplaced fracture of the right first rib at the costovertebral junction. This fracture is age indeterminate but appears new compared to the CT of 12/09/2016 and is suspicious for an acute fracture. Clinical correlation is recommended. There is biapical paraseptal emphysema. Other: None IMPRESSION: 1. No definite acute/traumatic cervical spine pathology. Evaluation however is limited due to motion artifact. 2. Nondisplaced fracture of the right first rib at the costovertebral junction, possibly acute. Clinical correlation is recommended. Electronically Signed   By: Elgie Collard M.D.   On: 03/07/2017 04:12   Mr Lumbar Spine Wo Contrast  Result Date: 03/07/2017 CLINICAL DATA:  Decreased sensation and lower extremity weakness. Fall. Neurological decline. EXAM: MRI LUMBAR SPINE WITHOUT CONTRAST TECHNIQUE: Multiplanar, multisequence MR imaging of the lumbar spine was performed. No intravenous contrast was administered. COMPARISON:  Lumbar spine radiographs March 07, 2017 at 0347 hours and CT chest, abdomen and pelvis December 09, 2016 FINDINGS: SEGMENTATION: For the purposes of this report, the last well-formed intervertebral disc will be reported as L5-S1. ALIGNMENT: Maintained lumbar lordosis. Minimal grade 1 L5-S1 anterolisthesis. Chronic LEFT L5 pars interarticularis defect. Minimal grade 1 L2-3 retrolisthesis. VERTEBRAE:Vertebral bodies are intact. Mild L2-3 and minimal L3-4 disc height loss with mild desiccation. Mild chronic discogenic endplate changes L2-3. No abnormal or acute bone marrow signal. CONUS MEDULLARIS: Conus  medullaris terminates at T12-L1 and demonstrates normal morphology and signal characteristics. Cauda equina is normal. PARASPINAL AND SOFT TISSUES: Nonacute. Complex infrarenal aortic aneurysm and dissection similar to prior CT given differences in imaging technique. DISC LEVELS: T12-L1, L1-2: No disc bulge, canal stenosis nor neural foraminal narrowing. L2-3: Retrolisthesis. Annular bulging. Small bilateral subarticular to extraforaminal disc protrusions. No canal stenosis. Mild bilateral neural foraminal narrowing. L3-4: Annular bulging. Small broad-based RIGHT extraforaminal disc protrusion encroaching upon the exited LEFT L3 nerve. Minimal facet arthropathy and ligamentum flavum redundancy without canal stenosis. Mild to moderate RIGHT neural foraminal narrowing. L4-5: No disc bulge. Mild facet arthropathy and ligamentum flavum redundancy without canal stenosis. Mild RIGHT greater than LEFT neural foraminal narrowing. L5-S1: Anterolisthesis. Annular bulging. Severe RIGHT facet arthropathy. No canal stenosis. Mild RIGHT neural foraminal narrowing. IMPRESSION: 1. Minimal grade 1 L5-S1 anterolisthesis. Chronic RIGHT L5 pars interarticularis defect. No acute osseous process. 2. Degenerative change of the lumbar spine. 3. No canal stenosis. Neural foraminal narrowing L2-3 through L5-S1: Mild moderate on the RIGHT at L3-4. Electronically Signed   By: Awilda Metro M.D.   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 Status post aortic arch replacement and endograft repair on 01/26/2017. Abrupt onset lower extremity weakness, weakness to the point that the patient is unable to walk. Flaccid lower extremities and absent rectal tone EXAM: MRI THORACIC SPINE WITHOUT CONTRAST TECHNIQUE: Multiplanar, multisequence MR imaging of the thoracic spine was performed. No  intravenous contrast was administered. COMPARISON:  Lumbar MRI 0431 hours today. FINDINGS: The examination had to be discontinued prior to completion due to pain and claustrophobia, patient refusal to continue. Only sagittal Sagittal (T1, T2, and STIR) imaging of the thoracic spine was obtained obtained. Limited sagittal T1 imaging of the cervical spine also was obtained. Limited cervical spine imaging: Chronic disc and endplate degeneration at C6-C7 with mild retrolisthesis, and mild anterolisthesis at the cervicothoracic junction. Thoracic spine segmentation:  Appears normal. Alignment:  Normal thoracic vertebral height and alignment. Vertebrae: No marrow  edema or evidence of acute osseous abnormality. Visualized bone marrow signal is within normal limits. Cord: Spinal cord signal and morphology appears within normal limits from the cervicothoracic junction to the mid T9 level. From the mid to lower T9 level to the conus there is abnormal T2 and STIR hyperintensity within the cord (series 5, image 6 and series 8, image 6). The central cord is affected, with minimal if any spinal cord expansion. The anterior and posterior cord surfaces appear spared. The T1 signal within the cord here remains normal. Paraspinal and other soft tissues: Negative visualized posterior paraspinal soft tissues. No prevertebral soft tissue edema or fluid. Partially visible abnormal thoracic and upper abdominal aorta -see also series 6, image 1 on the lumbar images today. Disc levels: No thoracic spinal stenosis. Normal for age thoracic intervertebral discs, with occasional disc desiccation. No definite thoracic disc herniation. Intermittent thoracic posterior element hypertrophy, maximal at the cervicothoracic junction where there is mild anterolisthesis. IMPRESSION: 1. The examination had to be discontinued prior to completion, but nonetheless the lower thoracic spinal cord signal appears abnormal from the lower T9 level to the conus.  The constellation of clinical and imaging findings were discussed by telephone with Dr. Gwenevere Abbot On 03/07/2017 at 0843 hours, who advised that on exam the patient has at T10 cord sensory level. We discussed that the combination of abrupt onset lower extremity weakness, known abnormal aorta, and central appearing cord signal abnormality are suspicious for spinal cord infarct. 2. No thoracic spinal stenosis and mild for age thoracic spine degeneration except at the cervicothoracic junction where mild anterolisthesis is associated with facet arthropathy. Electronically Signed   By: Odessa Fleming M.D.   On: 03/07/2017 08:57   Ct Angio Chest/abd/pel For Dissection W And/or W/wo  Result Date: 03/07/2017 CLINICAL DATA:  Back pain, acute lower extremity weakness. EXAM: CT ANGIOGRAPHY CHEST, ABDOMEN AND PELVIS TECHNIQUE: Multidetector CT imaging through the chest, abdomen and pelvis was performed using the standard protocol during bolus administration of intravenous contrast. Multiplanar reconstructed images and MIPs were obtained and reviewed to evaluate the vascular anatomy. CONTRAST:  ISOVUE-370 IOPAMIDOL (ISOVUE-370) INJECTION 76% COMPARISON:  CT scan of December 09, 2016.  MRI of January 05, 2017. FINDINGS: CTA CHEST FINDINGS Cardiovascular: Status post surgical repair of ascending thoracic aortic dissection. There is been interval placement of stent graft extending from proximal portion of transverse aortic arch and through descending thoracic aorta to the proximal abdominal aorta. There has been interval thrombosis of most of the dissection involving the descending thoracic aorta noted on prior exam. The right innominate and left common carotid arteries are widely patent without stenosis. 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 Aneurysmal dilatation of descending thoracic aorta is noted with maximum measured diameter 6.2 cm  seen proximally. Mediastinum/Nodes: Stable right peritracheal lymph nodes are noted which most likely are inflammatory or reactive in etiology. Stable enlargement of thyroid gland is noted. The esophagus is unremarkable. No new adenopathy is noted. Lungs/Pleura: No pneumothorax or pleural effusion is noted. Stable bulla formation is noted in both upper lobes. Minimal bilateral posterior basilar subsegmental atelectasis is noted. Musculoskeletal: No chest wall abnormality. No acute or significant osseous findings. Review of the MIP images confirms the above findings. CTA ABDOMEN AND PELVIS FINDINGS VASCULAR Aorta: Thoracic aortic dissection is again noted extending into proximal abdominal aorta, terminating in the infrarenal aorta. It is unchanged compared to prior exam. Stable  5 cm proximal abdominal aortic aneurysm is noted. Celiac: Arises predominantly from the false lumen, but no significant stenosis of thrombus is noted. SMA: Patent without evidence of aneurysm, dissection, vasculitis or significant stenosis. Renals: Right renal artery arises from false lumen, while left renal artery arises from true lumen. No significant stenosis is noted. IMA: Patent without evidence of aneurysm, dissection, vasculitis or significant stenosis. Inflow: Iliac arteries are widely patent without significant stenosis. Thrombosed fem-fem bypass graft is noted. 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 Veins: No obvious venous abnormality within the limitations of this arterial phase study. Review of the MIP images confirms the above findings. NON-VASCULAR Hepatobiliary: No focal liver abnormality is seen. No gallstones, gallbladder wall thickening, or biliary dilatation. Pancreas: Unremarkable. No pancreatic ductal dilatation or surrounding inflammatory changes. Spleen: Normal in size without focal abnormality. Adrenals/Urinary Tract: Adrenal glands appear normal. Stable 2.3  cm low density is noted in right kidney. No hydronephrosis or renal obstruction is noted. Urinary bladder is unremarkable. Stomach/Bowel: Stomach is within normal limits. Appendix appears normal. No evidence of bowel wall thickening, distention, or inflammatory changes. Lymphatic: No significant adenopathy is noted. Reproductive: Prostate is unremarkable. Other: Large left inguinal hernia is noted which contains loops of small bowel, but does not result in incarceration or obstruction. Musculoskeletal: No acute or significant osseous findings. Review of the MIP images confirms the above findings. IMPRESSION: Status post surgical repair of ascending thoracic aortic dissection. Interval placement of stent graft extending from proximal transverse aortic arch and through descending thoracic aorta into abdominal aorta. There is nearly complete thrombosis of false lumen since prior exam. Aneurysmal dilatation of descending thoracic aorta is noted with maximum diameter of 6.2 cm. Abdominal aortic dissection is unchanged which extends to infrarenal abdominal aorta. Stable 5 cm proximal abdominal aortic aneurysm is noted. The celiac and right renal arteries arise predominantly from the false lumen. Stable large left inguinal hernia is noted which contains loops of small bowel, but does not result in incarceration or obstruction. 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 Stable emphysematous disease is noted in both upper lobes. Electronically Signed   By: Lupita Raider, M.D.   On: 03/07/2017 13:01     I have independently reviewed the above radiologic studies.  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  Avoid hypotension.  It is unclear at this point whether the patient will regain any lower extremity  function, but acute placement of drain can sometimes turn this around. 2/the origin of the left subclavian graft may be occluded though the left subclavian does fill as does the left vertebral with fairly normal pressures in the left arm this point I would not recommend any definite intervention on this currently 3/patient will need aggressive, PT, OT, and standard care protocols for spinal cord injury.     I  spent 40 minutes counseling the patient face to face and 50% or more the  time was spent in counseling and coordination of care. The total time spent in the appointment was 60 minutes.    Delight OvensEdward B Telisha Zawadzki MD      [address] [place] [id number] Office [phone]   [address] [phone]  03/07/2017 1:31 PM

## 2017-03-07 NOTE — Consult Note (Signed)
ED Consult    Reason for Consult:  ED Referring Physician:  Loss of function ble MRN #:  [id number]  History of Present Illness: This is a 61 y.o. male with history of aortic arch replacement with bypass to his innominate, left cca and left subclavian arteries followed by [facility] for dissection with aneurysm. This was approximately 6 weeks ago. Last night he noted that his legs were weak and he could not walk about 2 a.m. He then presented to the ED at 3a.m.Marland Kitchen. I was asked to see him and first evaluated at 1045a.m.Marland Kitchen. At that time he had a sensory deficit bilaterally that he said was improving. He was also complaining of profound weakness right more than left. He has some residual numbness in his right hand from surgery but no other symptoms.   Past Medical History:  Diagnosis Date  . History of kidney stones   . Hypertension   . Thoracic aortic aneurysm (HCC) 09/2014    Past Surgical History:  Procedure Laterality Date  . CARDIAC CATHETERIZATION    . s/p thoracic aneurysm repair    . VASCULAR SURGERY      No Known Allergies  Prior to Admission medications   Medication Sig Start Date End Date Taking? Authorizing Provider  aspirin 81 MG EC tablet Take 1 tablet (81 mg total) by mouth daily. 01/28/17  Yes Gold, Wayne E, PA-C  losartan (COZAAR) 25 MG tablet Take 1 tablet (25 mg total) by mouth daily. 01/29/17  Yes Gold, Wayne E, PA-C  metoprolol tartrate (LOPRESSOR) 25 MG tablet Take 0.5 tablets (12.5 mg total) by mouth 2 (two) times daily. Patient taking differently: Take 25 mg 2 (two) times daily by mouth.  01/28/17  Yes Gold, Wayne E, PA-C  oxyCODONE 10 MG TABS Take 1 tablet (10 mg total) by mouth every 6 (six) hours as needed for severe pain. 01/28/17  Yes Gold, Glenice LaineWayne E, PA-C  amiodarone (PACERONE) 200 MG tablet Please take 2 tabs twice a day for 2 days and then take one tab twice a day until we see you in the office. 01/28/17   Gold, Glenice LaineWayne E, PA-C  chlorproMAZINE (THORAZINE) 10 MG tablet Take  1 tablet (10 mg total) by mouth every 8 (eight) hours as needed (hiccups). Patient not taking: Reported on 03/07/2017 01/28/17   Rowe ClackGold, Wayne E, PA-C    Social History   Socioeconomic History  . Marital status: Single    Spouse name: Not on file  . Number of children: Not on file  . Years of education: Not on file  . Highest education level: Not on file  Social Needs  . Financial resource strain: Not on file  . Food insecurity - worry: Not on file  . Food insecurity - inability: Not on file  . Transportation needs - medical: Not on file  . Transportation needs - non-medical: Not on file  Occupational History  . Not on file  Tobacco Use  . Smoking status: Former Smoker    Years: 35.00    Types: Cigarettes, Cigars    Last attempt to quit: 10/07/2014    Years since quitting: 2.4  . Smokeless tobacco: Never Used  Substance and Sexual Activity  . Alcohol use: No    Alcohol/week: 0.0 oz  . Drug use: No  . Sexual activity: Not on file  Other Topics Concern  . Not on file  Social History Narrative  . Not on file     Family History  Problem Relation Age of  Onset  . Heart murmur Mother     REVIEW OF SYSTEMS (negative unless checked):   Cardiac:  []  Chest pain or chest pressure? []  Shortness of breath upon activity? []  Shortness of breath when lying flat? []  Irregular heart rhythm?  Vascular:  []  Pain in calf, thigh, or hip brought on by walking? []  Pain in feet at night that wakes you up from your sleep? []  Blood clot in your veins? []  Leg swelling?  Pulmonary:  []  Oxygen at home? []  Productive cough? []  Wheezing?  Neurologic:  []  Sudden weakness in arms or legs? [x]  Sudden numbness in arms or legs? []  Sudden onset of difficult speaking or slurred speech? []  Temporary loss of vision in one eye? []  Problems with dizziness?  Gastrointestinal:  []  Blood in stool? []  Vomited blood?  Genitourinary:  []  Burning when urinating? []  Blood in  urine?  Psychiatric:  []  Major depression  Hematologic:  []  Bleeding problems? []  Problems with blood clotting?  Dermatologic:  []  Rashes or ulcers?  Constitutional:  []  Fever or chills?  Ear/Nose/Throat:  []  Change in hearing? []  Nose bleeds? []  Sore throat?  Musculoskeletal:  []  Back pain? []  Joint pain? []  Muscle pain?   Physical Examination  Vitals:   03/07/17 1330 03/07/17 1400  BP:  112/79  Pulse:  89  Resp: (!) 30 (!) 26  Temp:  98.2 F (36.8 C)  SpO2:  100%   Body mass index is 24.12 kg/m.  General:  WDWN in NAD HENT: WNL, normocephalic Pulmonary: normal non-labored breathing, without Rales, rhonchi,  wheezing Cardiac: palpable left radial pulse Palpable femoral and pedal pulses Abdomen: soft, NT/ND, no masses Extremities: no cce Musculoskeletal: no muscle wasting or atrophy  Weakness plantar and dorsiflexion bilaterally left greater than right Neurologic: sensation in tact  Psychiatric:  Appropriate Affect  CBC    Component Value Date/Time   WBC 11.1 (H) 03/07/2017 0327   RBC 4.68 03/07/2017 0327   HGB 11.9 (L) 03/07/2017 0327   HGB 10.8 (L) 02/17/2017 0955   HCT 40.8 03/07/2017 0327   HCT 33.8 (L) 02/17/2017 0955   PLT 236 03/07/2017 0327   PLT 381 (H) 02/17/2017 0955   MCV 87.2 03/07/2017 0327   MCV 88 02/17/2017 0955   MCH 25.4 (L) 03/07/2017 0327   MCHC 29.2 (L) 03/07/2017 0327   RDW 14.9 03/07/2017 0327   RDW 15.9 (H) 02/17/2017 0955   LYMPHSABS 3.0 03/07/2017 0327   MONOABS 0.5 03/07/2017 0327   EOSABS 0.2 03/07/2017 0327   BASOSABS 0.0 03/07/2017 0327    BMET    Component Value Date/Time   NA 139 03/07/2017 0327   NA 142 02/17/2017 0955   K 3.8 03/07/2017 0327   CL 108 03/07/2017 0327   CO2 19 (L) 03/07/2017 0327   GLUCOSE 117 (H) 03/07/2017 0327   BUN 16 03/07/2017 0327   BUN 11 02/17/2017 0955   CREATININE 1.49 (H) 03/07/2017 0327   CREATININE 1.14 02/03/2015 0936   CALCIUM 8.7 (L) 03/07/2017 0327   GFRNONAA 49  (L) 03/07/2017 0327   GFRAA 57 (L) 03/07/2017 0327    COAGS: Lab Results  Component Value Date   INR 1.09 01/21/2017   INR 1.16 01/21/2017   INR 1.36 01/21/2017     CT IMPRESSION: 1. Interval aortic dissection repair with arch debranching. 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 The other great vessels are widely patent. No embolic disease seen in the neck; no left vertebral  stenosis. 2. Chest CTA reported separately.   ASSESSMENT/PLAN: This is a 61 y.o. male s/p on 9.28 Procedure:   #1:  Re-do sternotomy                         #2:  Aortic arch debranching with circulatory arrest: aorto to Innominate (10mm), aorto to left carotid artery (8mm), Aorto to left subclavian bypass (8mm)                         #3:  TEVAR                         #4:  Proximal extension x 2                         #5:  IVUS of the aortic arch and descending thoracic aorta, and upper abdominal aorta, including visceral vessels (celiac, SMA, bilateral renal arteries)                         #6:  Thoracic and abdominal aortogram  He is now here with spinal cord ischemia. At the time of consult he was preparing to have spinal drain placed. He should avoid hypotension. H/H are adequate. Will follow.  Draxton Luu C. Randie Heinz, MD [facility] Office: [phone] Pager: [phone]

## 2017-03-07 NOTE — ED Notes (Signed)
Assumed care of pt at this time, pt in CT.

## 2017-03-07 NOTE — Progress Notes (Signed)
Spoke to radiologist concerning CTA.  Neck CTA  showing severe subclavian thrombus. Currently pt is asymptomatic. Remaining CTA chest abdomen read is pending IR. Appreciate input from radiology. Anticoagulation decisions will need to be made pending remaining results and further discussions w/ cardiothoracic team.    Shelly Flattenavid Miko Sirico, MD [facility] 03/07/2017, 12:54 PM

## 2017-03-07 NOTE — ED Notes (Signed)
PT returned from CT, able to provide urine sample. PT denies pain at this time.  This RN spoke with short stay regarding pt's status. Will call back with updates when CT results are released.

## 2017-03-07 NOTE — ED Notes (Signed)
Received call from short stay, pt will be going to [facility] for a spinal drain to be placed by Dr. Noreene LarssonJoslin, anesthesia. Will obtain consent and transport pt

## 2017-03-07 NOTE — Progress Notes (Signed)
Thoracic spine MRI personally reviewed and discussed with radiology. There is longitudinally extensive abnormal T2-hyperintense signal within the central spinal cord extending from about the T9 level to the distal tip of the conus medullaris. The appearance is most consistent with a spinal cord infarction. 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 Elevated T2-weighted signal abnormality is most consistent with a completed ischemic infarction. He was out of the tPA time window on arrival as he was in bed for an extended period of time prior to attempting to get up and then falling. Endovascular procedure likely to be futile in addition to carrying with it an unacceptable risk of hemorrhagic conversion and/or thoracic aneurysmal rupture. Discussed with Radiology and Dr. Amada JupiterKirkpatrick.  Electronically signed: Dr. Caryl PinaEric Libi Corso

## 2017-03-07 NOTE — H&P (Signed)
History and Physical    Victor [name] WUJ:[id number] DOB: 1956/01/04 DOA: 03/07/2017   PCP: Billee Cashing, MD   Patient coming from:  Home    Chief Complaint: Bilateral leg weakness   HPI: Victor [name] is a 61 y.o. 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 Information is inconsistent at times, unclear if the patient is poor historian, or limited mental capacity.  No head trauma, or loss of consciousness.  He denies any syncope.  He was somewhat dizzy, but denies any syncope. He reported  having intermittent lower extremity weakness over the last 2 months. He reports having chronic low back pain, but worsening over the last week, but he denies any decreased sensation at the time.  He also reported difficulty in producing bowel movements, but he denied any urinary dysfunction.  He denies any chest pain or palpitations.  He denies any shortness of breath or cough.  He denies any recent infections.  He denies any fever or chills.  He denies any sick contacts.  He denies any recent travels. No history of stroke or TIAs Neurology consultation was obtained.  Katheran Awe was considered, but Dr. Wadie Lessen did not feel that this was likely, although recommended performing an MRI of the spine, which is remarkable for possble T9 ischemic process.  He received Solu-Medrol IV in large dose.  While initially the patient could not move his feet, he is now extending and flexing them.  Sensation is intact. CT of the C-spine and plain films of the x-rays of the back were negative.     ED Course:  BP 96/67   Pulse 73   Temp (!) 95.4 F (35.2 C) (Rectal) Comment: warm blankets given  Resp 17   Ht 5\' 7"  (1.702 m)   Wt 69.9 kg (154 lb)   SpO2 100%   BMI 24.12 kg/m    Received Ativan 2 mg Dilaudid x1  Soumedrol 1 g IV at 7 am  IVF bolus 500 cc  WBC 11.1. Hemoglobin 11.9. Creatinine 1.49. Bicarb was 19 Lactic acid pending Blood cultures pending. Echo 12/23/2016 LV EF: 60% - 65% grade 2 diastolic dysfunction, normal systolic Stress test showed EF 55%, no ischemia or infarction, low risk study.   In view of possible ischemic/ infarction at the T9  CTS was called   Review of Systems:  As per HPI otherwise all other systems reviewed and are negative  Past Medical History:  Diagnosis Date  . History of kidney stones   . Hypertension   . Thoracic aortic aneurysm (HCC) 09/2014    Past Surgical History:  Procedure Laterality Date  . CARDIAC CATHETERIZATION    . s/p thoracic aneurysm repair    . VASCULAR SURGERY      Social History Social History   Socioeconomic History  . Marital status: Single    Spouse name: Not on file  . Number of children: Not on file  . Years of education: Not on file  . Highest education level: Not on file  Social Needs  . Financial resource strain: Not on file  . Food insecurity - worry: Not on file  . Food insecurity - inability: Not on file  . Transportation needs - medical: Not on file  .  Transportation needs - non-medical: Not on file  Occupational History  . Not on file  Tobacco Use  . Smoking status: Former Smoker    Years: 35.00    Types: Cigarettes, Cigars    Last attempt to quit: 10/07/2014    Years since quitting: 2.4  . Smokeless tobacco: Never Used  Substance and Sexual Activity  . Alcohol use: No    Alcohol/week: 0.0 oz  . Drug use: No  . Sexual activity: Not on file  Other Topics Concern  . Not on file  Social History Narrative  . Not on file     No Known Allergies  Family History  Problem Relation Age of Onset  . Heart murmur Mother       Prior to Admission medications   Medication Sig Start Date End Date Taking? Authorizing Provider  amiodarone (PACERONE) 200 MG tablet  Please take 2 tabs twice a day for 2 days and then take one tab twice a day until we see you in the office. 01/28/17   Rowe Clack, PA-C  aspirin 81 MG EC tablet Take 1 tablet (81 mg total) by mouth daily. 01/28/17   Gold, Glenice Laine, PA-C  chlorproMAZINE (THORAZINE) 10 MG tablet Take 1 tablet (10 mg total) by mouth every 8 (eight) hours as needed (hiccups). 01/28/17   Gold, Wayne E, PA-C  losartan (COZAAR) 25 MG tablet Take 1 tablet (25 mg total) by mouth daily. 01/29/17   Gold, Deniece Portela E, PA-C  metoprolol tartrate (LOPRESSOR) 25 MG tablet Take 0.5 tablets (12.5 mg total) by mouth 2 (two) times daily. 01/28/17   Gold, Wayne E, PA-C  oxyCODONE 10 MG TABS Take 1 tablet (10 mg total) by mouth every 6 (six) hours as needed for severe pain. 01/28/17   Rowe Clack, PA-C    Physical Exam:  Vitals:   03/07/17 0311 03/07/17 0336 03/07/17 0532 03/07/17 0630  BP:   (!) 60/30 96/67  Pulse:    73  Resp:    17  Temp:  (!) 95.4 F (35.2 C)    TempSrc:  Rectal    SpO2:    100%  Weight: 69.9 kg (154 lb)     Height: 5\' 7"  (1.702 m)      Constitutional: NAD, somewhat anxious.   Eyes: PERRL, lids and conjunctivae normal ENMT: Mucous membranes are moist, without exudate or lesions  Neck: normal, supple, no masses, no thyromegaly Respiratory: clear to auscultation bilaterally, no wheezing, no crackles. Normal respiratory effort  Cardiovascular: Regular rate and rhythm,  murmur, rubs or gallops. No extremity edema. 2+ pedal pulses. No carotid bruits.  Abdomen: Soft, non tender, No hepatosplenomegaly. Bowel sounds positive.  Musculoskeletal: no clubbing / cyanosis. Cannot move his lower extremities except for his feet, which can perform extension and flexion  Mild back and bony tenderness, but he reports this has been present for about 2 months. Skin: no jaundice, No lesions.  Neurologic: Sensation intact above umbilicus and slightly decreased below it.  Strength equal in all upper extremities, 0/5 B LE except feet     Per Neuro, patient has decreased rectal tone. Unable to assess gait  Psychiatric:   Alert and oriented x 3. Anxious mood.     Labs on Admission: I have personally reviewed following labs and imaging studies  CBC: Recent Labs  Lab 03/07/17 0327  WBC 11.1*  NEUTROABS 7.3  HGB 11.9*  HCT 40.8  MCV 87.2  PLT 236    Basic Metabolic Panel: Recent  Labs  Lab 03/07/17 0327  NA 139  K 3.8  CL 108  CO2 19*  GLUCOSE 117*  BUN 16  CREATININE 1.49*  CALCIUM 8.7*    GFR: Estimated Creatinine Clearance: 49.3 mL/min (A) (by C-G formula based on SCr of 1.49 mg/dL (H)).  Liver Function Tests: Recent Labs  Lab 03/07/17 0327  AST 19  ALT 12*  ALKPHOS 92  BILITOT 1.1  PROT 6.9  ALBUMIN 3.5   No results for input(s): LIPASE, AMYLASE in the last 168 hours. No results for input(s): AMMONIA in the last 168 hours.  Coagulation Profile: No results for input(s): INR, PROTIME in the last 168 hours.  Cardiac Enzymes: No results for input(s): CKTOTAL, CKMB, CKMBINDEX, TROPONINI in the last 168 hours.  BNP (last 3 results) No results for input(s): PROBNP in the last 8760 hours.  HbA1C: No results for input(s): HGBA1C in the last 72 hours.  CBG: No results for input(s): GLUCAP in the last 168 hours.  Lipid Profile: No results for input(s): CHOL, HDL, LDLCALC, TRIG, CHOLHDL, LDLDIRECT in the last 72 hours.  Thyroid Function Tests: No results for input(s): TSH, T4TOTAL, FREET4, T3FREE, THYROIDAB in the last 72 hours.  Anemia Panel: No results for input(s): VITAMINB12, FOLATE, FERRITIN, TIBC, IRON, RETICCTPCT in the last 72 hours.  Urine analysis:    Component Value Date/Time   COLORURINE YELLOW 01/19/2017 1011   APPEARANCEUR CLEAR 01/19/2017 1011   LABSPEC 1.026 01/19/2017 1011   PHURINE 5.0 01/19/2017 1011   GLUCOSEU NEGATIVE 01/19/2017 1011   HGBUR NEGATIVE 01/19/2017 1011   BILIRUBINUR NEGATIVE 01/19/2017 1011   KETONESUR NEGATIVE 01/19/2017 1011   PROTEINUR  NEGATIVE 01/19/2017 1011   NITRITE NEGATIVE 01/19/2017 1011   LEUKOCYTESUR NEGATIVE 01/19/2017 1011    Sepsis Labs: @LABRCNTIP (procalcitonin:4,lacticidven:4) )No results found for this or any previous visit (from the past 240 hour(s)).   Radiological Exams on Admission: Dg Chest 2 View  Result Date: 03/07/2017 CLINICAL DATA:  Unwitnessed fall, dizziness. Decreased sensation in extremities. EXAM: CHEST  2 VIEW COMPARISON:  Chest radiograph February 22, 2017 FINDINGS: Cardiac silhouette is mildly enlarged. Re- demonstration of thoracic aortic aneurysm, status post stent graft repair and median sternotomy. Minimal RIGHT lung base scarring. Mild chronic bronchitic changes without pleural effusion or focal consolidation. No pneumothorax. Old LEFT rib fractures. IMPRESSION: Mild cardiomegaly.  No acute pulmonary process. Re- demonstration of thoracic aortic aneurysm and stent graft repair. Electronically Signed   By: Awilda Metro M.D.   On: 03/07/2017 06:11   Dg Thoracic Spine 2 View  Result Date: 03/07/2017 CLINICAL DATA:  Dizziness, unwitnessed fall. EXAM: THORACIC SPINE 2 VIEWS COMPARISON:  Chest radiograph February 22, 2017 FINDINGS: There is no evidence of thoracic spine fracture. Alignment is normal. No other significant bone abnormalities are identified. Thoracic aorta stent graft. IMPRESSION: No acute fracture deformity or malalignment. Electronically Signed   By: Awilda Metro M.D.   On: 03/07/2017 06:08   Dg Lumbar Spine Complete  Result Date: 03/07/2017 CLINICAL DATA:  Larey Seat from bed.  Low back pain this morning. EXAM: LUMBAR SPINE - COMPLETE 4+ VIEW COMPARISON:  CT abdomen and pelvis December 09, 2016 FINDINGS: Five non rib-bearing lumbar-type vertebral bodies are intact scratch with maintenance of the lumbar lordosis. Stable minimal grade 1 L5-S1 retrolisthesis on degenerative basis. Moderate L5-S1 facet arthropathy. Intervertebral disc heights are normal. No destructive bony  lesions. Sacroiliac joints are symmetric. Included prevertebral and paraspinal soft tissue planes are non-suspicious. Included chest demonstrates descending thoracic stent graft. IMPRESSION: No  acute fracture deformity. Similar minimal grade 1 L5-S1 anterolisthesis on degenerative basis. Electronically Signed   By: Awilda Metroourtnay  Bloomer M.D.   On: 03/07/2017 04:11   Ct Cervical Spine Wo Contrast  Result Date: 03/07/2017 CLINICAL DATA:  61 year old male with trauma. EXAM: CT CERVICAL SPINE WITHOUT CONTRAST TECHNIQUE: Multidetector CT imaging of the cervical spine was performed without intravenous contrast. Multiplanar CT image reconstructions were also generated. COMPARISON:  Chest CT dated 12/09/2016 FINDINGS: Evaluation of this exam is limited due to motion artifact. Alignment: No acute subluxation. Skull base and vertebrae: No acute fracture. No primary bone lesion or focal pathologic process. There is incomplete bony fusion of posterior ring of C1. Soft tissues and spinal canal: No prevertebral fluid or swelling. No visible canal hematoma. Disc levels: There are degenerative changes most prominent at C6-C7 where there is endplate irregularity and disc space narrowing. Upper chest: There is a nondisplaced fracture of the right first rib at the costovertebral junction. This fracture is age indeterminate but appears new compared to the CT of 12/09/2016 and is suspicious for an acute fracture. Clinical correlation is recommended. There is biapical paraseptal emphysema. Other: None IMPRESSION: 1. No definite acute/traumatic cervical spine pathology. Evaluation however is limited due to motion artifact. 2. Nondisplaced fracture of the right first rib at the costovertebral junction, possibly acute. Clinical correlation is recommended. Electronically Signed   By: Elgie CollardArash  Radparvar M.D.   On: 03/07/2017 04:12   Mr Lumbar Spine Wo Contrast  Result Date: 03/07/2017 CLINICAL DATA:  Decreased sensation and lower  extremity weakness. Fall. Neurological decline. EXAM: MRI LUMBAR SPINE WITHOUT CONTRAST TECHNIQUE: Multiplanar, multisequence MR imaging of the lumbar spine was performed. No intravenous contrast was administered. COMPARISON:  Lumbar spine radiographs March 07, 2017 at 0347 hours and CT chest, abdomen and pelvis December 09, 2016 FINDINGS: SEGMENTATION: For the purposes of this report, the last well-formed intervertebral disc will be reported as L5-S1. ALIGNMENT: Maintained lumbar lordosis. Minimal grade 1 L5-S1 anterolisthesis. Chronic LEFT L5 pars interarticularis defect. Minimal grade 1 L2-3 retrolisthesis. VERTEBRAE:Vertebral bodies are intact. Mild L2-3 and minimal L3-4 disc height loss with mild desiccation. Mild chronic discogenic endplate changes L2-3. No abnormal or acute bone marrow signal. CONUS MEDULLARIS: Conus medullaris terminates at T12-L1 and demonstrates normal morphology and signal characteristics. Cauda equina is normal. PARASPINAL AND SOFT TISSUES: Nonacute. Complex infrarenal aortic aneurysm and dissection similar to prior CT given differences in imaging technique. DISC LEVELS: T12-L1, L1-2: No disc bulge, canal stenosis nor neural foraminal narrowing. L2-3: Retrolisthesis. Annular bulging. Small bilateral subarticular to extraforaminal disc protrusions. No canal stenosis. Mild bilateral neural foraminal narrowing. L3-4: Annular bulging. Small broad-based RIGHT extraforaminal disc protrusion encroaching upon the exited LEFT L3 nerve. Minimal facet arthropathy and ligamentum flavum redundancy without canal stenosis. Mild to moderate RIGHT neural foraminal narrowing. L4-5: No disc bulge. Mild facet arthropathy and ligamentum flavum redundancy without canal stenosis. Mild RIGHT greater than LEFT neural foraminal narrowing. L5-S1: Anterolisthesis. Annular bulging. Severe RIGHT facet arthropathy. No canal stenosis. Mild RIGHT neural foraminal narrowing. IMPRESSION: 1. Minimal grade 1 L5-S1  anterolisthesis. Chronic RIGHT L5 pars interarticularis defect. No acute osseous process. 2. Degenerative change of the lumbar spine. 3. No canal stenosis. Neural foraminal narrowing L2-3 through L5-S1: Mild moderate on the RIGHT at L3-4. Electronically Signed   By: Awilda Metroourtnay  Bloomer M.D.   On: 03/07/2017 05:13   Mr Thoracic Spine Limited Wo Contrast  Result Date: 03/07/2017 CLINICAL DATA:  61 year old male with history of expanding thoracic aortic aneurysm and type  1 aortic dissection. Status post aortic arch replacement and endograft repair on 01/26/2017. Abrupt onset lower extremity weakness, weakness to the point that the patient is unable to walk. Flaccid lower extremities and absent rectal tone EXAM: MRI THORACIC SPINE WITHOUT CONTRAST TECHNIQUE: Multiplanar, multisequence MR imaging of the thoracic spine was performed. No intravenous contrast was administered. COMPARISON:  Lumbar MRI 0431 hours today. FINDINGS: The examination had to be discontinued prior to completion due to pain and claustrophobia, patient refusal to continue. Only sagittal Sagittal (T1, T2, and STIR) imaging of the thoracic spine was obtained obtained. Limited sagittal T1 imaging of the cervical spine also was obtained. Limited cervical spine imaging: Chronic disc and endplate degeneration at C6-C7 with mild retrolisthesis, and mild anterolisthesis at the cervicothoracic junction. Thoracic spine segmentation:  Appears normal. Alignment:  Normal thoracic vertebral height and alignment. Vertebrae: No marrow edema or evidence of acute osseous abnormality. Visualized bone marrow signal is within normal limits. Cord: Spinal cord signal and morphology appears within normal limits from the cervicothoracic junction to the mid T9 level. From the mid to lower T9 level to the conus there is abnormal T2 and STIR hyperintensity within the cord (series 5, image 6 and series 8, image 6). The central cord is affected, with minimal if any spinal cord  expansion. The anterior and posterior cord surfaces appear spared. The T1 signal within the cord here remains normal. Paraspinal and other soft tissues: Negative visualized posterior paraspinal soft tissues. No prevertebral soft tissue edema or fluid. Partially visible abnormal thoracic and upper abdominal aorta -see also series 6, image 1 on the lumbar images today. Disc levels: No thoracic spinal stenosis. Normal for age thoracic intervertebral discs, with occasional disc desiccation. No definite thoracic disc herniation. Intermittent thoracic posterior element hypertrophy, maximal at the cervicothoracic junction where there is mild anterolisthesis. IMPRESSION: 1. The examination had to be discontinued prior to completion, but nonetheless the lower thoracic spinal cord signal appears abnormal from the lower T9 level to the conus. The constellation of clinical and imaging findings were discussed by telephone with Dr. Gwenevere AbbotEric Lindezen On 03/07/2017 at 0843 hours, who advised that on exam the patient has at T10 cord sensory level. We discussed that the combination of abrupt onset lower extremity weakness, known abnormal aorta, and central appearing cord signal abnormality are suspicious for spinal cord infarct. 2. No thoracic spinal stenosis and mild for age thoracic spine degeneration except at the cervicothoracic junction where mild anterolisthesis is associated with facet arthropathy. Electronically Signed   By: Odessa FlemingH  Hall M.D.   On: 03/07/2017 08:57    EKG: Independently reviewed.  Assessment/Plan Active Problems:   Paralysis of both lower limbs (HCC)   Aortic dissection (HCC)   Iron deficiency anemia   Essential hypertension   S/P aortic dissection repair  Paralysis of the lower extremities bilaterally, unclear etiology.  Workup in progress.vascular  (thrombosis/dissection)  Versus  Neuro versus infectious versus  other etiology Received ,Soumedrol 1 g IV  with minimal improvement of his symptoms  WBC  11.1. Bicarb 19 Afebrile. BP in the 90s/60s. Lactic acid is pending. BCx drawn. Holding antibiotics at this time. MRI spine without contrast suggest ischemic process T9. CT head  And CXR are non revealing  Admit to SDU  Await for results as above; if negative films, may need LP  Appreciate Neurological follow up, awaiting further recommendations  Periodic bladder scans  In the interim, will obtain TCS consult in view of recent AAA dissection and repair in 12/2016,  rule out thrombosis versus dissection at the hardware  CAD s/p Card Cath in 2018/ History of PVD s/p Ao FEm Fem bypass graft/ History of Ao dissection s/p repain 12/2016 ,  Echo 12/23/2016 LV EF: 60% - 65% grade 2 diastolic dysfunction, normal systolic Stress test showed EF 55%, no ischemia or infarction. EKG without ACS  Follow with [facility]. TCS   Hypotension in a patient with a history of HTN : Unclear cause Septic shock doubted as no source of infection or fever is noted.No pneumothorax on CXR. Did not receive his morning BP meds before arrival  BP improvement with 500 cc  NS Patient has received large dose of steroids which may help improve BP Check Orthostatics  Hold BP meds  IVF NS 75 cc/h due to Gr 2 DD  Check EKG   CKD with possible Acute Kidney Injury Cr 1.49, BL 1.16  Lab Results  Component Value Date   CREATININE 1.49 (H) 03/07/2017   CREATININE 1.28 (H) 02/17/2017   CREATININE 1.16 01/27/2017  IVF  CMET in am  Hold Cozaar       DVT prophylaxis: SCD   Code Status:    Full  Family Communication:  Discussed with patient Disposition Plan:  To be determined pending on ability to ambulate  Consults called:    Neuro per EDP  Admission status: SDU    Marlowe Kays, PA-C [facility]   03/07/2017, 9:11 AM

## 2017-03-07 NOTE — ED Notes (Signed)
Received call from MRI-- pt c/o unable to finish scan-- this RN and MD went to MRI, unable to convince pt to stay in scan, refused medicine, states "I just can't do it right now, I just can't go back in. Maybe later, but I can't right now. "  Pt returned to ED,

## 2017-03-07 NOTE — Procedures (Signed)
Anesthesiology Note:  Lumbar CSF drain inserted in Pre-op Holding Area. Full barrier precautions. R. Lateral decubitus position. Sedation with 2 mg versed, 100 mcg. Fentanyl.   Skin prepped with Chlorhexidine. 10 cc 1% lidocaine infiltrated. L3-4 level 4-5 passes with 16 G tuohy needle. Clear CSF, catheter threaded 5 cm into intrathecal space. Catheter drains well.   Victor [name]

## 2017-03-07 NOTE — Progress Notes (Signed)
Anesthesiology Follow-up:  Awake and alert, low back pain improved  with 0.5 mg dilaudid IV. Lumbar CSF drain set to maintain CSF pressure at 8-12 cm H2O. Now draining blood tinged CSF. Has persistent lower extremity weakness. Able to move feet but unable to lift legs.   VS: T- 36.8 BP- 112/79 HR 89 RR 22 O2 Sat 98% on RA  Impression: No improvement yet noted from insertion of lumbar CSF drain. Will follow. Low back pain stable.  Victor [name]

## 2017-03-07 NOTE — Progress Notes (Signed)
03/07/2017 11:00 PM   Pt c/o increasing back pain. Lumbar drain more serosanguinous than clear with more than 30 ml/hr for the last 2 hours. Output has increased with pt c/o pain. Per Dr. Maple HudsonMoser OK to clamp to maintain output of 30 ml/hr; Pt Ok to lay on side.    Carlyon ProwsShakiera Elijan Googe RN

## 2017-03-07 NOTE — Consult Note (Signed)
NEURO HOSPITALIST CONSULT NOTE   Requesting physician: Dr. Blinda LeatherwoodPollina  Reason for Consult: Bilateral lower extremity paralysis  History obtained from:   Patient and Chart     HPI:                                                                                                                                          Victor [name] is an 61 y.o. male who presents from home with acute lower extremity paralysis. He is a poor and inconsistent historian, requiring questions to be repeated multiple times for clarification many times during the interview. Best information obtained is as follows: He was in [facility] until about 3 weeks ago when he started to feel weak in the legs. He works as a Paediatric nursebarber and this would be noticed at times during the day, especially when attempting to stand up from a seated position. He denies having had any arm weakness, vision changes, confusion, trouble talking or sensory numbness during the 3 week time period. He does report having occasional SOB. This weekend, symptoms worsened on Saturday with trouble standing, so he decided to spend most of the day in bed watching TV. He had similar symptoms on Sunday, so he spent the day watching football on TV while lying in bed. He gives inconsistent reports regarding Sunday, at one point stating he spent the whole day in bed, and at another point stating that he could get up and walk. He states he lay in bed until about 1-2 AM on Monday morning (today), when he then tried to get up but legs gave out from under him. He fell to the floor per one account and slid downwards by another account. He could not get up after he slid/fell to the floor. He called out to his parents, who he lives with, and they called EMS. He denies bowel or bladder incontinence, but states that for the past 3-4 weeks he has had trouble fully emptying his bladder.   Additional history from Dr. Bing PlumePollina's note reviewed: "Patient brought to the emergency  department from home.  Patient reported a fall prior to EMS arrival.  Circumstances are unclear.  He tells me that he got dizzy and this caused him to fall.  He thinks he took "too much pain medicine".  He told EMS, however, that when he tried to stand up his legs gave out and he fell directly to the floor.  Patient reports that he has been experiencing low back pain for some time.  It appears that he has chronic low back pain, but his pain has been worsening this week.  He reports that he had increasing pain tonight prior to the fall, but now the pain is severe since he fell.  Since the fall he reports that  he cannot feel his legs or move them."  Per Dr. Blinda Leatherwood, exam revealed flaccid lower extremities, normal upper extremity strength, and absent rectal tone. As an indication of the patient's impaired ability to provide an accurate history, he states that he had an aortic aneurysm repair in August of this year, when Epic lists the surgery as having been undertaken in June of 2016.   Past Medical History:  Diagnosis Date  . History of kidney stones   . Hypertension   . Thoracic aortic aneurysm (HCC) 09/2014    Past Surgical History:  Procedure Laterality Date  . CARDIAC CATHETERIZATION      Family History  Problem Relation Age of Onset  . Heart murmur Mother    Social History:  reports that he quit smoking about 2 years ago. His smoking use included cigarettes and cigars. He quit after 35.00 years of use. he has never used smokeless tobacco. He reports that he does not drink alcohol or use drugs.  No Known Allergies  HOME MEDICATIONS:                                                                                                                       ROS:                                                                                                                                       As per HPI.   Blood pressure (!) 60/30, pulse 71, temperature (!) 95.4 F (35.2 C), temperature  source Rectal, resp. rate 12, height 5\' 7"  (1.702 m), weight 69.9 kg (154 lb), SpO2 100 %.  General Examination:                                                                                                      HEENT-  [place]/AT  Lungs- Respirations unlabored. Not using accessory muscles of respiration.  Abdomen- Old midline scar noted. Nondistended.  Extremities- Warm and well perfused distally x 4. No  edema.   Neurological Examination Mental Status: Alert and fully oriented.  Speech fluent with intact comprehension and naming. Able to follow or demonstrate accurate comprehension of all motor commands without difficulty. Cranial Nerves: II: Visual fields intact. PERRL.   III,IV, VI: EOMI. No nystagmus.  V,VII: Smile symmetric, facial temp sensation normal bilaterally VIII: hearing intact to conversation IX,X: No hypophonia, nasal speech or hoarseness XI: Symmetric shoulder shrug XII: midline tongue extension Motor: Bilateral upper extremities 5/5 proximal and distal with normal tone.  Bilateral lower extremities with flaccid tone from hips to ankles. No volitional movement at hips, knees or ankles. Able to weakly wiggle toes bilaterally. When legs are passively flexed with feet on bed and knees elevated, they both fall laterally to bed when released. No clonus with rapid passive dorsiflexion at ankles. No reflex movement to noxious plantar stimulation bilaterally.  Sensory: Decreased  temp, FT, proprioception and vibration sensation bilaterally below the level of the umbilicus (T10 sensory level). Some retained sensation to lower extremities, including acute discomfort with noxious plantar stimulation. Sensation above umbilicus is normal.  Deep Tendon Reflexes: 3+ biceps and brachioradialis bilaterally. 0 patellae and achilles bilaterally with and without reinforcement maneuver. Toes are mute bilaterally.  Cerebellar: No ataxia with FNF bilaterally.  Gait: Unable to assess  Lab  Results: Basic Metabolic Panel: Recent Labs  Lab 03/07/17 0327  NA 139  K 3.8  CL 108  CO2 19*  GLUCOSE 117*  BUN 16  CREATININE 1.49*  CALCIUM 8.7*    Liver Function Tests: Recent Labs  Lab 03/07/17 0327  AST 19  ALT 12*  ALKPHOS 92  BILITOT 1.1  PROT 6.9  ALBUMIN 3.5   No results for input(s): LIPASE, AMYLASE in the last 168 hours. No results for input(s): AMMONIA in the last 168 hours.  CBC: Recent Labs  Lab 03/07/17 0327  WBC 11.1*  NEUTROABS 7.3  HGB 11.9*  HCT 40.8  MCV 87.2  PLT 236    Cardiac Enzymes: No results for input(s): CKTOTAL, CKMB, CKMBINDEX, TROPONINI in the last 168 hours.  Lipid Panel: No results for input(s): CHOL, TRIG, HDL, CHOLHDL, VLDL, LDLCALC in the last 168 hours.  CBG: No results for input(s): GLUCAP in the last 168 hours.  Microbiology: Results for orders placed or performed during the hospital encounter of 01/21/17  Culture, expectorated sputum-assessment     Status: None   Collection Time: 01/24/17 11:26 AM  Result Value Ref Range Status   Specimen Description SPUTUM  Final   Special Requests NONE  Final   Sputum evaluation   Final    Sputum specimen not acceptable for testing.  Please recollect.   Results Called to: Hiram Gash, RN AT [phone] ON 01/24/17 BY C. JESSUP, MLT.    Report Status 01/24/2017 FINAL  Final    Coagulation Studies: No results for input(s): LABPROT, INR in the last 72 hours.  Imaging: Dg Lumbar Spine Complete  Result Date: 03/07/2017 CLINICAL DATA:  Larey Seat from bed.  Low back pain this morning. EXAM: LUMBAR SPINE - COMPLETE 4+ VIEW COMPARISON:  CT abdomen and pelvis December 09, 2016 FINDINGS: Five non rib-bearing lumbar-type vertebral bodies are intact scratch with maintenance of the lumbar lordosis. Stable minimal grade 1 L5-S1 retrolisthesis on degenerative basis. Moderate L5-S1 facet arthropathy. Intervertebral disc heights are normal. No destructive bony lesions. Sacroiliac joints are symmetric.  Included prevertebral and paraspinal soft tissue planes are non-suspicious. Included chest demonstrates descending thoracic stent graft. IMPRESSION: No acute fracture deformity. Similar minimal grade  1 L5-S1 anterolisthesis on degenerative basis. Electronically Signed   By: Awilda Metro M.D.   On: 03/07/2017 04:11   Ct Cervical Spine Wo Contrast  Result Date: 03/07/2017 CLINICAL DATA:  61 year old male with trauma. EXAM: CT CERVICAL SPINE WITHOUT CONTRAST TECHNIQUE: Multidetector CT imaging of the cervical spine was performed without intravenous contrast. Multiplanar CT image reconstructions were also generated. COMPARISON:  Chest CT dated 12/09/2016 FINDINGS: Evaluation of this exam is limited due to motion artifact. Alignment: No acute subluxation. Skull base and vertebrae: No acute fracture. No primary bone lesion or focal pathologic process. There is incomplete bony fusion of posterior ring of C1. Soft tissues and spinal canal: No prevertebral fluid or swelling. No visible canal hematoma. Disc levels: There are degenerative changes most prominent at C6-C7 where there is endplate irregularity and disc space narrowing. Upper chest: There is a nondisplaced fracture of the right first rib at the costovertebral junction. This fracture is age indeterminate but appears new compared to the CT of 12/09/2016 and is suspicious for an acute fracture. Clinical correlation is recommended. There is biapical paraseptal emphysema. Other: None IMPRESSION: 1. No definite acute/traumatic cervical spine pathology. Evaluation however is limited due to motion artifact. 2. Nondisplaced fracture of the right first rib at the costovertebral junction, possibly acute. Clinical correlation is recommended. Electronically Signed   By: Elgie Collard M.D.   On: 03/07/2017 04:12   Mr Lumbar Spine Wo Contrast  Result Date: 03/07/2017 CLINICAL DATA:  Decreased sensation and lower extremity weakness. Fall. Neurological decline.  EXAM: MRI LUMBAR SPINE WITHOUT CONTRAST TECHNIQUE: Multiplanar, multisequence MR imaging of the lumbar spine was performed. No intravenous contrast was administered. COMPARISON:  Lumbar spine radiographs March 07, 2017 at 0347 hours and CT chest, abdomen and pelvis December 09, 2016 FINDINGS: SEGMENTATION: For the purposes of this report, the last well-formed intervertebral disc will be reported as L5-S1. ALIGNMENT: Maintained lumbar lordosis. Minimal grade 1 L5-S1 anterolisthesis. Chronic LEFT L5 pars interarticularis defect. Minimal grade 1 L2-3 retrolisthesis. VERTEBRAE:Vertebral bodies are intact. Mild L2-3 and minimal L3-4 disc height loss with mild desiccation. Mild chronic discogenic endplate changes L2-3. No abnormal or acute bone marrow signal. CONUS MEDULLARIS: Conus medullaris terminates at T12-L1 and demonstrates normal morphology and signal characteristics. Cauda equina is normal. PARASPINAL AND SOFT TISSUES: Nonacute. Complex infrarenal aortic aneurysm and dissection similar to prior CT given differences in imaging technique. DISC LEVELS: T12-L1, L1-2: No disc bulge, canal stenosis nor neural foraminal narrowing. L2-3: Retrolisthesis. Annular bulging. Small bilateral subarticular to extraforaminal disc protrusions. No canal stenosis. Mild bilateral neural foraminal narrowing. L3-4: Annular bulging. Small broad-based RIGHT extraforaminal disc protrusion encroaching upon the exited LEFT L3 nerve. Minimal facet arthropathy and ligamentum flavum redundancy without canal stenosis. Mild to moderate RIGHT neural foraminal narrowing. L4-5: No disc bulge. Mild facet arthropathy and ligamentum flavum redundancy without canal stenosis. Mild RIGHT greater than LEFT neural foraminal narrowing. L5-S1: Anterolisthesis. Annular bulging. Severe RIGHT facet arthropathy. No canal stenosis. Mild RIGHT neural foraminal narrowing. IMPRESSION: 1. Minimal grade 1 L5-S1 anterolisthesis. Chronic RIGHT L5 pars interarticularis  defect. No acute osseous process. 2. Degenerative change of the lumbar spine. 3. No canal stenosis. Neural foraminal narrowing L2-3 through L5-S1: Mild moderate on the RIGHT at L3-4. Electronically Signed   By: Awilda Metro M.D.   On: 03/07/2017 05:13    Assessment: Acute onset of flaccid paraplegia on possible background of gradual decompensation over a 3 week period 1. Lack of rectal tone and T10 sensory level on exam in  conjunction with flaccid paralysis of both lower extremities. Can wiggle toes weakly.  2. Best localization is a T10 cord lesion secondary to extrinsic compression (compression fracture, disc herniation, epidural abscess) or intrinsic lesion (spinal cord infarction, transverse myelitis, syrinx). Also possible is AIDP.  3. Lumbar spine MRI shows no acute osseous process or canal stenosis. Neural foraminal narrowing is incidental and cannot account for the patient's paraplegia.  4. Cervical spine CT shows no fracture.   Recommendations: 1. STAT MRI of thoracic spine with and without contrast 2. Add post-contrast images to lumbar spine MRI while obtaining thoracic MRI.  3. MRI cervical spine with and without contrast.  4. Will need LP to assess for possible albuminocytologic dissociation if the above studies are unrevealing.  5. Bladder scans at periodic intervals 6. Discussed case with Dr. Blinda LeatherwoodPollina   Electronically signed: Dr. Caryl PinaEric Vi Biddinger 03/07/2017, 5:55 AM

## 2017-03-08 ENCOUNTER — Inpatient Hospital Stay (HOSPITAL_COMMUNITY): Payer: Medicaid Other

## 2017-03-08 ENCOUNTER — Inpatient Hospital Stay (HOSPITAL_COMMUNITY): Payer: Medicaid Other | Admitting: Anesthesiology

## 2017-03-08 ENCOUNTER — Encounter (HOSPITAL_COMMUNITY)
Admission: EM | Disposition: A | Discharge status: Rehabilitation Facility | Payer: Self-pay | Source: Home / Self Care | Attending: Internal Medicine

## 2017-03-08 ENCOUNTER — Encounter (HOSPITAL_COMMUNITY): Payer: Self-pay | Admitting: Certified Registered Nurse Anesthetist

## 2017-03-08 DIAGNOSIS — R29898 Other symptoms and signs involving the musculoskeletal system: Secondary | ICD-10-CM

## 2017-03-08 DIAGNOSIS — I742 Embolism and thrombosis of arteries of the upper extremities: Secondary | ICD-10-CM

## 2017-03-08 HISTORY — PX: THROMBECTOMY ILIAC ARTERY: SHX6405

## 2017-03-08 LAB — CBC
HCT: 32.3 % — ABNORMAL LOW (ref 39.0–52.0)
Hemoglobin: 10.1 g/dL — ABNORMAL LOW (ref 13.0–17.0)
MCH: 26.4 pg (ref 26.0–34.0)
MCHC: 31.3 g/dL (ref 30.0–36.0)
MCV: 84.3 fL (ref 78.0–100.0)
PLATELETS: 210 10*3/uL (ref 150–400)
RBC: 3.83 MIL/uL — AB (ref 4.22–5.81)
RDW: 15.2 % (ref 11.5–15.5)
WBC: 7.2 10*3/uL (ref 4.0–10.5)

## 2017-03-08 LAB — COMPREHENSIVE METABOLIC PANEL
ALK PHOS: 76 U/L (ref 38–126)
ALT: 10 U/L — AB (ref 17–63)
AST: 11 U/L — ABNORMAL LOW (ref 15–41)
Albumin: 2.9 g/dL — ABNORMAL LOW (ref 3.5–5.0)
Anion gap: 6 (ref 5–15)
BILIRUBIN TOTAL: 0.9 mg/dL (ref 0.3–1.2)
BUN: 12 mg/dL (ref 6–20)
CALCIUM: 8.3 mg/dL — AB (ref 8.9–10.3)
CO2: 24 mmol/L (ref 22–32)
CREATININE: 1.01 mg/dL (ref 0.61–1.24)
Chloride: 108 mmol/L (ref 101–111)
GFR calc Af Amer: >60 mL/min (ref >60)
Glucose, Bld: 117 mg/dL — ABNORMAL HIGH (ref 65–99)
Potassium: 4 mmol/L (ref 3.5–5.1)
Sodium: 138 mmol/L (ref 135–145)
TOTAL PROTEIN: 5.6 g/dL — AB (ref 6.5–8.1)

## 2017-03-08 LAB — PROTIME-INR
INR: 1.35
PROTHROMBIN TIME: 16.6 s — AB (ref 11.4–15.2)

## 2017-03-08 LAB — HIV ANTIBODY (ROUTINE TESTING W REFLEX): HIV SCREEN 4TH GENERATION: NONREACTIVE

## 2017-03-08 SURGERY — THROMBECTOMY, ARTERY, ILIAC
Anesthesia: General | Site: Arm Upper | Laterality: Left

## 2017-03-08 MED ORDER — LACTATED RINGERS IV SOLN
INTRAVENOUS | Status: DC
  Administered 2017-03-08: 14:00:00 via INTRAVENOUS

## 2017-03-08 MED ORDER — LIDOCAINE HCL 1 % IJ SOLN
INTRAMUSCULAR | Status: AC
  Filled 2017-03-08: qty 20

## 2017-03-08 MED ORDER — FENTANYL CITRATE (PF) 250 MCG/5ML IJ SOLN
INTRAMUSCULAR | Status: AC
  Filled 2017-03-08: qty 5

## 2017-03-08 MED ORDER — FENTANYL CITRATE (PF) 100 MCG/2ML IJ SOLN: 25.0000 ug | INTRAMUSCULAR | Status: DC | PRN

## 2017-03-08 MED ORDER — ONDANSETRON HCL 4 MG/2ML IJ SOLN: 4.0000 mg | Freq: Once | INTRAMUSCULAR | Status: DC | PRN

## 2017-03-08 MED ORDER — MIDAZOLAM HCL 2 MG/2ML IJ SOLN
INTRAMUSCULAR | Status: AC
  Filled 2017-03-08: qty 2

## 2017-03-08 MED ORDER — PROTAMINE SULFATE 10 MG/ML IV SOLN
INTRAVENOUS | Status: DC | PRN
  Administered 2017-03-08: 20 mg via INTRAVENOUS
  Administered 2017-03-08: 10 mg via INTRAVENOUS

## 2017-03-08 MED ORDER — CEFAZOLIN SODIUM-DEXTROSE 2-3 GM-%(50ML) IV SOLR
INTRAVENOUS | Status: DC | PRN
  Administered 2017-03-08: 2 g via INTRAVENOUS

## 2017-03-08 MED ORDER — CEFAZOLIN SODIUM-DEXTROSE 2-4 GM/100ML-% IV SOLN
INTRAVENOUS | Status: AC
  Filled 2017-03-08: qty 100

## 2017-03-08 MED ORDER — PROTAMINE SULFATE 10 MG/ML IV SOLN
INTRAVENOUS | Status: AC
  Filled 2017-03-08: qty 5

## 2017-03-08 MED ORDER — LIDOCAINE 2% (20 MG/ML) 5 ML SYRINGE
INTRAMUSCULAR | Status: AC
  Filled 2017-03-08: qty 10

## 2017-03-08 MED ORDER — PHENYLEPHRINE HCL 10 MG/ML IJ SOLN
INTRAMUSCULAR | Status: DC | PRN
  Administered 2017-03-08: 25 ug/min via INTRAVENOUS

## 2017-03-08 MED ORDER — PROPOFOL 10 MG/ML IV BOLUS
INTRAVENOUS | Status: DC | PRN
  Administered 2017-03-08: 140 mg via INTRAVENOUS

## 2017-03-08 MED ORDER — MIDAZOLAM HCL 5 MG/5ML IJ SOLN
INTRAMUSCULAR | Status: DC | PRN
  Administered 2017-03-08: 2 mg via INTRAVENOUS

## 2017-03-08 MED ORDER — FENTANYL CITRATE (PF) 100 MCG/2ML IJ SOLN
INTRAMUSCULAR | Status: DC | PRN
  Administered 2017-03-08: 50 ug via INTRAVENOUS
  Administered 2017-03-08: 100 ug via INTRAVENOUS

## 2017-03-08 MED ORDER — HEPARIN SODIUM (PORCINE) 1000 UNIT/ML IJ SOLN
INTRAMUSCULAR | Status: AC
  Filled 2017-03-08: qty 1

## 2017-03-08 MED ORDER — DEXAMETHASONE SODIUM PHOSPHATE 10 MG/ML IJ SOLN
INTRAMUSCULAR | Status: DC | PRN
  Administered 2017-03-08: 5 mg via INTRAVENOUS

## 2017-03-08 MED ORDER — ROCURONIUM BROMIDE 10 MG/ML (PF) SYRINGE
PREFILLED_SYRINGE | INTRAVENOUS | Status: AC
  Filled 2017-03-08: qty 5

## 2017-03-08 MED ORDER — SUGAMMADEX SODIUM 200 MG/2ML IV SOLN
INTRAVENOUS | Status: AC
  Filled 2017-03-08: qty 2

## 2017-03-08 MED ORDER — PAPAVERINE HCL 30 MG/ML IJ SOLN
INTRAMUSCULAR | Status: DC | PRN
  Administered 2017-03-08: 60 mg via INTRAVENOUS

## 2017-03-08 MED ORDER — OXYCODONE HCL 5 MG PO TABS: 5.0000 mg | ORAL_TABLET | Freq: Once | ORAL | Status: DC | PRN

## 2017-03-08 MED ORDER — DEXAMETHASONE SODIUM PHOSPHATE 10 MG/ML IJ SOLN
INTRAMUSCULAR | Status: AC
  Filled 2017-03-08: qty 2

## 2017-03-08 MED ORDER — PAPAVERINE HCL 30 MG/ML IJ SOLN
INTRAMUSCULAR | Status: AC
  Filled 2017-03-08: qty 2

## 2017-03-08 MED ORDER — SODIUM CHLORIDE 0.9 % IV SOLN
INTRAVENOUS | Status: DC | PRN
  Administered 2017-03-08: 500 mL

## 2017-03-08 MED ORDER — OXYCODONE HCL 5 MG/5ML PO SOLN: 5.0000 mg | Freq: Once | ORAL | Status: DC | PRN

## 2017-03-08 MED ORDER — LORAZEPAM 2 MG/ML IJ SOLN
1.0000 mg | Freq: Once | INTRAMUSCULAR | Status: DC | PRN
  Filled 2017-03-08: qty 1

## 2017-03-08 MED ORDER — ROCURONIUM BROMIDE 100 MG/10ML IV SOLN
INTRAVENOUS | Status: DC | PRN
  Administered 2017-03-08: 50 mg via INTRAVENOUS

## 2017-03-08 MED ORDER — LIDOCAINE HCL (CARDIAC) 20 MG/ML IV SOLN
INTRAVENOUS | Status: DC | PRN
  Administered 2017-03-08: 50 mg via INTRAVENOUS

## 2017-03-08 MED ORDER — 0.9 % SODIUM CHLORIDE (POUR BTL) OPTIME
TOPICAL | Status: DC | PRN
  Administered 2017-03-08: 1000 mL

## 2017-03-08 MED ORDER — SUGAMMADEX SODIUM 200 MG/2ML IV SOLN
INTRAVENOUS | Status: DC | PRN
  Administered 2017-03-08: 150 mg via INTRAVENOUS

## 2017-03-08 MED ORDER — HEPARIN SODIUM (PORCINE) 1000 UNIT/ML IJ SOLN
INTRAMUSCULAR | Status: DC | PRN
  Administered 2017-03-08: 7000 [IU] via INTRAVENOUS

## 2017-03-08 MED ORDER — ONDANSETRON HCL 4 MG/2ML IJ SOLN
INTRAMUSCULAR | Status: AC
  Filled 2017-03-08: qty 4

## 2017-03-08 MED ORDER — ONDANSETRON HCL 4 MG/2ML IJ SOLN
INTRAMUSCULAR | Status: DC | PRN
  Administered 2017-03-08: 4 mg via INTRAVENOUS

## 2017-03-08 SURGICAL SUPPLY — 55 items
ADH SKN CLS APL DERMABOND .7 (GAUZE/BANDAGES/DRESSINGS) ×1
CANISTER SUCT 3000ML PPV (MISCELLANEOUS) ×3 IMPLANT
CANNULA VESSEL 3MM 2 BLNT TIP (CANNULA) ×4 IMPLANT
CATH EMB 3FR 80CM (CATHETERS) ×2 IMPLANT
CATH EMB 4FR 80CM (CATHETERS) ×4 IMPLANT
CATH EMB 5FR 80CM (CATHETERS) ×2 IMPLANT
CATH ROBINSON RED A/P 18FR (CATHETERS) IMPLANT
CATH SUCT 10FR WHISTLE TIP (CATHETERS) ×1 IMPLANT
CLIP VESOCCLUDE MED 24/CT (CLIP) ×3 IMPLANT
CLIP VESOCCLUDE SM WIDE 24/CT (CLIP) ×3 IMPLANT
CRADLE DONUT ADULT HEAD (MISCELLANEOUS) ×3 IMPLANT
DERMABOND ADVANCED (GAUZE/BANDAGES/DRESSINGS) ×2
DERMABOND ADVANCED .7 DNX12 (GAUZE/BANDAGES/DRESSINGS) ×1 IMPLANT
DRAIN CHANNEL 15F RND FF W/TCR (WOUND CARE) IMPLANT
DRAPE IMP U-DRAPE 54X76 (DRAPES) ×2 IMPLANT
ELECT REM PT RETURN 9FT ADLT (ELECTROSURGICAL) ×3
ELECTRODE REM PT RTRN 9FT ADLT (ELECTROSURGICAL) ×1 IMPLANT
EVACUATOR SILICONE 100CC (DRAIN) IMPLANT
GLOVE BIO SURGEON STRL SZ 6.5 (GLOVE) ×2 IMPLANT
GLOVE BIO SURGEONS STRL SZ 6.5 (GLOVE) ×2
GLOVE BIOGEL PI IND STRL 6.5 (GLOVE) ×3 IMPLANT
GLOVE BIOGEL PI IND STRL 7.5 (GLOVE) ×1 IMPLANT
GLOVE BIOGEL PI IND STRL 8 (GLOVE) IMPLANT
GLOVE BIOGEL PI INDICATOR 6.5 (GLOVE) ×6
GLOVE BIOGEL PI INDICATOR 7.5 (GLOVE)
GLOVE BIOGEL PI INDICATOR 8 (GLOVE) ×2
GLOVE ECLIPSE 7.5 STRL STRAW (GLOVE) ×2 IMPLANT
GLOVE SURG SS PI 7.5 STRL IVOR (GLOVE) ×3 IMPLANT
GOWN STRL REUS W/ TWL LRG LVL3 (GOWN DISPOSABLE) ×2 IMPLANT
GOWN STRL REUS W/ TWL XL LVL3 (GOWN DISPOSABLE) ×1 IMPLANT
GOWN STRL REUS W/TWL LRG LVL3 (GOWN DISPOSABLE) ×12
GOWN STRL REUS W/TWL XL LVL3 (GOWN DISPOSABLE) ×3
HEMOSTAT SNOW SURGICEL 2X4 (HEMOSTASIS) IMPLANT
INSERT FOGARTY SM (MISCELLANEOUS) IMPLANT
KIT BASIN OR (CUSTOM PROCEDURE TRAY) ×3 IMPLANT
KIT ROOM TURNOVER OR (KITS) ×3 IMPLANT
NS IRRIG 1000ML POUR BTL (IV SOLUTION) ×6 IMPLANT
PACK CAROTID (CUSTOM PROCEDURE TRAY) ×3 IMPLANT
PAD ARMBOARD 7.5X6 YLW CONV (MISCELLANEOUS) ×6 IMPLANT
SHUNT CAROTID BYPASS 10 (VASCULAR PRODUCTS) IMPLANT
SHUNT CAROTID BYPASS 12FRX15.5 (VASCULAR PRODUCTS) IMPLANT
SPECIMEN JAR SMALL (MISCELLANEOUS) ×3 IMPLANT
STOCKINETTE 6  STRL (DRAPES) ×2
STOCKINETTE 6 STRL (DRAPES) IMPLANT
SUT ETHILON 3 0 PS 1 (SUTURE) IMPLANT
SUT PROLENE 6 0 BV (SUTURE) ×5 IMPLANT
SUT PROLENE 7 0 BV 1 (SUTURE) IMPLANT
SUT VIC AB 3-0 SH 27 (SUTURE) ×3
SUT VIC AB 3-0 SH 27X BRD (SUTURE) ×2 IMPLANT
SUT VICRYL 4-0 PS2 18IN ABS (SUTURE) ×3 IMPLANT
SYR TB 1ML LUER SLIP (SYRINGE) ×2 IMPLANT
SYRINGE 20CC LL (MISCELLANEOUS) ×2 IMPLANT
SYRINGE 3CC LL L/F (MISCELLANEOUS) ×6 IMPLANT
TRAY FOLEY W/METER SILVER 16FR (SET/KITS/TRAYS/PACK) ×1 IMPLANT
WATER STERILE IRR 1000ML POUR (IV SOLUTION) ×3 IMPLANT

## 2017-03-08 NOTE — Addendum Note (Signed)
Addendum  created 03/08/17 2032 by Kipp BroodJoslin, Olaf Mesa, MD   Sign clinical note

## 2017-03-08 NOTE — Anesthesia Procedure Notes (Signed)
Procedure Name: Intubation Date/Time: 03/08/2017 2:38 PM Performed by: Jed LimerickHarder, Rashanna Christiana S, CRNA Pre-anesthesia Checklist: Patient identified, Emergency Drugs available, Suction available and Patient being monitored Patient Re-evaluated:Patient Re-evaluated prior to induction Oxygen Delivery Method: Circle System Utilized Preoxygenation: Pre-oxygenation with 100% oxygen Induction Type: IV induction Ventilation: Mask ventilation without difficulty Laryngoscope Size: Glidescope and 3 Grade View: Grade I Tube type: Oral Tube size: 7.5 mm Number of attempts: 1 Airway Equipment and Method: Stylet and Video-laryngoscopy Placement Confirmation: ETT inserted through vocal cords under direct vision,  positive ETCO2 and breath sounds checked- equal and bilateral Secured at: 22 cm Tube secured with: Tape Dental Injury: Teeth and Oropharynx as per pre-operative assessment  Difficulty Due To: Difficult Airway- due to dentition Comments: Elective glidescope intubation d/t poor dentition

## 2017-03-08 NOTE — Op Note (Signed)
    NAME: Victor [name]    MRN: [id number] DOB: [birth year]    DATE OF OPERATION: 03/08/2017  PREOP DIAGNOSIS:    Thrombosis of left subclavian artery  POSTOP DIAGNOSIS:    Same  PROCEDURE:    Thrombectomy of left brachial artery  SURGEON: Di Kindlehristopher S. Edilia Boickson, MD, FACS  ASSIST: Doreatha MassedSamantha Rhyne, PA  ANESTHESIA: Gen   EBL: min  INDICATIONS:    Victor [name] is a 61 y.o. 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  On a chest CT he was noted to have luminal thrombus in the left proximal subclavian artery.  It was felt that attempted thrombectomy was indicated although the clot may be chronic.  FINDINGS:    Unable to retrieve clot, despite multiple attempts with 3, 4, and 5 Fogarty catheter.   TECHNIQUE:   The patient was taken to the operating room and received a general anesthetic.  The left arm was prepped and draped in the usual sterile fashion.  A longitudinal incision was made over the brachial artery just above the antecubital level.  Here the artery was dissected free and controlled proximally distally.  The patient was heparinized.  The artery was clamped proximally and distally and a transverse arteriotomy made.  Passed a #4 Fogarty catheter 45 cm before meeting obstruction.  Slight multiple attempts I was unable to pass this catheter.  I subsequently tried a 5 Fogarty catheter multiple times but this too was not successful in addition I tried this with the stylette to allow a stiffer catheter but again was not successful.  I therefore tried a 3 Fogarty catheter and this too was not successful.  My concern was that if I persisted I could potentially disrupt the anastomosis between the graft and the subclavian artery.  Therefore stopped at this point.  The arteriotomy was closed with 2 running 6-0 Prolene sutures.  At  the completion was a palpable radial pulse.  Hemostasis was obtained in the wound and the heparin was partially reversed with protamine.  The wound was closed with a deep layer of 3-0 Vicryl and skin closed with 4-0 Vicryl.  Dermabond was applied.  Patient tolerated the procedure well and was transferred to the recovery room in stable condition.  All needle and sponge counts were correct.  Waverly Ferrarihristopher Kalina Morabito, MD, FACS [facility]  DATE OF DICTATION:   03/08/2017

## 2017-03-08 NOTE — Progress Notes (Signed)
PT Cancellation Note  Patient Details Name: Karna ChristmasMarcus E Loiseau MRN: [id number] DOB: 11/19/1955   Cancelled Treatment:    Reason Eval/Treat Not Completed: Medical issues which prohibited therapy(pt on bedrest and await ability for increased activity)   Charne Mcbrien B Joniah Bednarski 03/08/2017, 7:15 AM Delaney MeigsMaija Tabor Lexiana Spindel, PT [phone]

## 2017-03-08 NOTE — Progress Notes (Signed)
  [facility] TEAM 1 - Stepdown/ICU TEAM  Chart reviewed.  Pt has been under the expert care of Vascular Surgery today.    There is nothing for me to add the care of this patient for today.   [facility] will f/u in AM, and will continue to field all calls for medical issues as they arise.  Lonia BloodJeffrey T. McClung, MD [facility] Office  [phone] Pager - Text Page per Amion as per below:  On-Call/Text Page:      [email or web address]      password [email or web address]  If 7PM-7AM, please contact night-coverage [email or web address] Password [email or web address] 03/08/2017, 6:02 PM

## 2017-03-08 NOTE — Progress Notes (Signed)
Anesthesiology Follow-up:  Awake and alert, complaining of low back pain unchanged from yesterday. Persistent LE weakness, able to move toes, unable to lift legs. Spinal drain working well, draining 10-15 cc/hr, set to maintain CSF pressure at 8-12 cm H2O.  BP- 123/74 HR- 73 RR-10 O2 Sat 98% on RA  Patient to go to OR today for L . Subclavian thrombectomy.  Victor [name]

## 2017-03-08 NOTE — Progress Notes (Signed)
Patient taken to CT then to short stay bay 36 for surgery, bedside report given to [facility] RN.  Drain clamped at this time.  Pt with no complaints. Pt family updated.

## 2017-03-08 NOTE — Progress Notes (Signed)
    Subjective  -   Lumbar drain put out approximately maximum amounts overnight Patient complaining of back pain No change in his ability to move his legs No symptoms in his left arm  Physical Exam:  Patient has the ability to move his toes as well as sensation and both feet.  He can flex his right knee.  He cannot flex his left knee.  He is unable to lift his legs up off of the bed.       Assessment/Plan:    Given the lack of improvement with lumbar drainage, I have recommended proceeding with thrombectomy of his left subclavian artery.  I told him I am not sure if this is going to make any difference in his leg symptoms.  He is in agreement.  He understands that he may be left with a permanent deficit.  Durene CalBrabham, Wells 03/08/2017 7:23 AM --  Vitals:   03/08/17 0600 03/08/17 0700  BP: 113/66 122/61  Pulse: 78 70  Resp: 18 11  Temp:    SpO2: 99% 99%    Intake/Output Summary (Last 24 hours) at 03/08/2017 0723 Last data filed at 03/08/2017 0700 Gross per 24 hour  Intake 1440 ml  Output 1915 ml  Net -475 ml     Laboratory CBC    Component Value Date/Time   WBC 7.2 03/08/2017 0401   HGB 10.1 (L) 03/08/2017 0401   HGB 10.8 (L) 02/17/2017 0955   HCT 32.3 (L) 03/08/2017 0401   HCT 33.8 (L) 02/17/2017 0955   PLT 210 03/08/2017 0401   PLT 381 (H) 02/17/2017 0955    BMET    Component Value Date/Time   NA 138 03/08/2017 0401   NA 142 02/17/2017 0955   K 4.0 03/08/2017 0401   CL 108 03/08/2017 0401   CO2 24 03/08/2017 0401   GLUCOSE 117 (H) 03/08/2017 0401   BUN 12 03/08/2017 0401   BUN 11 02/17/2017 0955   CREATININE 1.01 03/08/2017 0401   CREATININE 1.14 02/03/2015 0936   CALCIUM 8.3 (L) 03/08/2017 0401   GFRNONAA >60 03/08/2017 0401   GFRAA >60 03/08/2017 0401    COAG Lab Results  Component Value Date   INR 1.35 03/08/2017   INR 1.09 01/21/2017   INR 1.16 01/21/2017   No results found for: PTT  Antibiotics Anti-infectives (From admission,  onward)   None       V. Charlena CrossWells Adelei Scobey IV, M.D. [facility] Office: [phone] Pager:  [phone]

## 2017-03-08 NOTE — Transfer of Care (Signed)
Immediate Anesthesia Transfer of Care Note  Patient: Victor [name]  Procedure(s) Performed: THROMBECTOMY of Left subclavian artery (Left Arm Upper)  Patient Location: PACU  Anesthesia Type:General  Level of Consciousness: awake, alert  and oriented  Airway & Oxygen Therapy: Patient Spontanous Breathing and Patient connected to nasal cannula oxygen  Post-op Assessment: Report given to RN and Post -op Vital signs reviewed and stable  Post vital signs: Reviewed and stable  Last Vitals:  Vitals:   03/08/17 1200 03/08/17 1600  BP: 114/69   Pulse: 71   Resp: 10   Temp:  36.7 C  SpO2: 97%     Last Pain:  Vitals:   03/08/17 1200  TempSrc: Oral  PainSc:       Patients Stated Pain Goal: 2 (03/08/17 0247)  Complications: No apparent anesthesia complications

## 2017-03-08 NOTE — Anesthesia Postprocedure Evaluation (Signed)
Anesthesia Post Note  Patient: Victor [name]  Procedure(s) Performed: THROMBECTOMY of Left subclavian artery (Left Arm Upper)     Patient location during evaluation: PACU Anesthesia Type: General Level of consciousness: awake, awake and alert and oriented Pain management: pain level controlled Vital Signs Assessment: post-procedure vital signs reviewed and stable Respiratory status: spontaneous breathing, nonlabored ventilation and respiratory function stable Cardiovascular status: blood pressure returned to baseline Anesthetic complications: no    Last Vitals:  Vitals:   03/08/17 1800 03/08/17 1900  BP: 133/75 134/72  Pulse: 73 71  Resp: 13 12  Temp:    SpO2: 100% 100%    Last Pain:  Vitals:   03/08/17 1715  TempSrc: Oral  PainSc: 7                  Agam Tuohy COKER

## 2017-03-08 NOTE — Progress Notes (Signed)
STROKE TEAM PROGRESS NOTE   SUBJECTIVE (INTERVAL HISTORY) No family is at the bedside.   Overall he feels his condition is unchanged.  Voices no new complaints. No new events reported overnight. Patient is scheduled for Left Subclavian thrombectomy today. Lumbar epidural drain is draining well patient reports no improvement. MRI scan of thoracic spine shows hyperintensity in the spinal cord from T9 to the conus likely spinal cord infarct. Patient was unable to tolerate incomplete rest of the spine MRI  OBJECTIVE No results for input(s): GLUCAP in the last 168 hours. Recent Labs  Lab 03/07/17 0327 03/08/17 0401  NA 139 138  K 3.8 4.0  CL 108 108  CO2 19* 24  GLUCOSE 117* 117*  BUN 16 12  CREATININE 1.49* 1.01  CALCIUM 8.7* 8.3*   Recent Labs  Lab 03/07/17 0327 03/08/17 0401  AST 19 11*  ALT 12* 10*  ALKPHOS 92 76  BILITOT 1.1 0.9  PROT 6.9 5.6*  ALBUMIN 3.5 2.9*   Recent Labs  Lab 03/07/17 0327 03/08/17 0401  WBC 11.1* 7.2  NEUTROABS 7.3  --   HGB 11.9* 10.1*  HCT 40.8 32.3*  MCV 87.2 84.3  PLT 236 210   No results for input(s): CKTOTAL, CKMB, CKMBINDEX, TROPONINI in the last 168 hours. Recent Labs    03/08/17 0401  LABPROT 16.6*  INR 1.35   Recent Labs    03/07/17 1143  COLORURINE YELLOW  LABSPEC 1.042*  PHURINE 5.0  GLUCOSEU NEGATIVE  HGBUR NEGATIVE  BILIRUBINUR NEGATIVE  KETONESUR NEGATIVE  PROTEINUR NEGATIVE  NITRITE NEGATIVE  LEUKOCYTESUR NEGATIVE       Component Value Date/Time   CHOL 121 12/23/2016 0813   TRIG 57 12/23/2016 0813   HDL 43 12/23/2016 0813   CHOLHDL 2.8 12/23/2016 0813   CHOLHDL 2.5 02/03/2015 0936   VLDL 12 02/03/2015 0936   LDLCALC 67 12/23/2016 0813   Lab Results  Component Value Date   HGBA1C 5.8 (H) 01/19/2017   No results found for: LABOPIA, COCAINSCRNUR, LABBENZ, AMPHETMU, THCU, LABBARB  No results for input(s): ETH in the last 168 hours.  IMAGING: I have personally reviewed the radiological images below and  agree with the radiology interpretations.  Ct Angio Neck W Or Wo Contrast Result Date: 03/07/2017 IMPRESSION: 1. Interval aortic dissection repair with arch debranching. 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 The other great vessels are widely patent. No embolic disease seen in the neck; no left vertebral stenosis. 2. Chest CTA reported separately. Electronically Signed   By: Marnee SpringJonathon  Watts M.D.   On: 03/07/2017 13:00   Ct Cervical Spine Wo Contrast Result Date: 03/07/2017  IMPRESSION: 1. No definite acute/traumatic cervical spine pathology. Evaluation however is limited due to motion artifact. 2. Nondisplaced fracture of the right first rib at the costovertebral junction, possibly acute. Clinical correlation is recommended. Electronically Signed   By: Elgie CollardArash  Radparvar M.D.   On: 03/07/2017 04:12   Mr Lumbar Spine Wo Contrast Result Date: 03/07/2017 IMPRESSION: 1. Minimal grade 1 L5-S1 anterolisthesis. Chronic RIGHT L5 pars interarticularis defect. No acute osseous process. 2. Degenerative change of the lumbar spine. 3. No canal stenosis. Neural foraminal narrowing L2-3 through L5-S1: Mild moderate on the RIGHT at L3-4. Electronically Signed   By: Awilda Metroourtnay  Bloomer M.D.   On: 03/07/2017 05:13   Mr Thoracic Spine Limited Wo Contrast Result Date: 03/07/2017 IMPRESSION: 1. The examination had to be discontinued prior to completion, but nonetheless the lower thoracic spinal cord signal  appears abnormal from the lower T9 level to the conus. The constellation of clinical and imaging findings were discussed by telephone with Dr. Gwenevere AbbotEric Lindezen On 03/07/2017 at 0843 hours, who advised that on exam the patient has at T10 cord sensory level. We discussed that the combination of abrupt onset lower extremity weakness, known abnormal aorta, and central appearing cord signal abnormality are suspicious for spinal cord infarct. 2. No thoracic spinal stenosis and mild for age  thoracic spine degeneration except at the cervicothoracic junction where mild anterolisthesis is associated with facet arthropathy. Electronically Signed   By: Odessa FlemingH  Hall M.D.   On: 03/07/2017 08:57   Ct Angio Chest/abd/pel For Dissection W And/or W/wo Result Date: 03/07/2017  IMPRESSION: Status post surgical repair of ascending thoracic aortic dissection. Interval placement of stent graft extending from proximal transverse aortic arch and through descending thoracic aorta into abdominal aorta. There is nearly complete thrombosis of false lumen since prior exam. Aneurysmal dilatation of descending thoracic aorta is noted with maximum diameter of 6.2 cm. Abdominal aortic dissection is unchanged which extends to infrarenal abdominal aorta. Stable 5 cm proximal abdominal aortic aneurysm is noted. The celiac and right renal arteries arise predominantly from the false lumen. Stable large left inguinal hernia is noted which contains loops of small bowel, but does not result in incarceration or obstruction. 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 Stable emphysematous disease is noted in both upper lobes. Electronically Signed   By: Lupita RaiderJames  Green Jr, M.D.   On: 03/07/2017 13:01   ECHO TEE (OR)    01/21/17  Result status: Final result   Left ventricle: Concentric hypertrophy of moderate severity. LV systolic function is mildly reduced with an EF of 45-50%. There are no obvious wall motion abnormalities.  Septum: No Patent Foramen Ovale present.  Left atrium: Patent foramen ovale not present.  Aortic valve: The valve is trileaflet. Trace regurgitation.  Aorta: The aortic root is dilated. Aneurysm present in the transverse aorta and descending aorta. DeBakey type III dissection present between the ascending aorta and the descending aorta.  Mitral valve: Trace regurgitation.  Right ventricle: Normal cavity  size.  Tricuspid valve: Mild regurgitation.  Aorta: Graft present in the descending aorta and transverse aorta.      PHYSICAL EXAM Temp:  [98.2 F (36.8 C)-99.7 F (37.6 C)] 98.4 F (36.9 C) (11/13 0800) Pulse Rate:  [68-89] 71 (11/13 1200) Resp:  [10-30] 10 (11/13 1200) BP: (93-137)/(44-101) 114/69 (11/13 1200) SpO2:  [96 %-100 %] 97 % (11/13 1200) Weight:  [71 kg (156 lb 8 oz)] 71 kg (156 lb 8 oz) (11/13 0300)  General - Well nourished, well developed, in no apparent distress Respiratory - Lungs clear bilaterally. No wheezing. Cardiovascular - Regular rate and rhythm, +pulses in feet bilaterally   Neurological Examination Mental Status: Alert and fully oriented.  Speech fluent with intact comprehension and naming. Able to follow or demonstrate accurate comprehension of all motor commands without difficulty. Cranial Nerves: II: Visual fields intact. PERRL.   III,IV, VI: EOMI. No nystagmus.  V,VII: Smile symmetric, facial temp sensation normal bilaterally VIII: hearing intact to conversation IX,X: No hypophonia, nasal speech or hoarseness XI: Symmetric shoulder shrug XII: midline tongue extension Motor: Bilateral upper extremities 5/5 proximal and distal with normal tone.  Bilateral lower extremities with flaccid tone at hips, 1/5 below the knees and 3/5 at ankles. Moves LE's Right side  greater than Left No volitional movement at hips, knees or ankles. Able to weakly wiggle toes bilaterally. When legs are passively flexed with feet on bed and knees elevated, they both fall laterally to bed when released. No clonus with rapid passive dorsiflexion at ankles. No reflex movement to noxious plantar stimulation bilaterally.  Sensory: Decreased  temp, FT, proprioception and vibration sensation bilaterally below the level of the umbilicus (T10 sensory level). Some retained sensation to lower extremities, including acute discomfort with noxious plantar stimulation. Sensation above  umbilicus is normal.  Deep Tendon Reflexes: 3+ biceps and brachioradialis bilaterally. 0 patellae and achilles bilaterally with and without reinforcement maneuver. Toes are mute bilaterally.  Positive umbilical sign  with umbilicus mowing upwards when patient flexes his neck Cerebellar: No ataxia with FNF bilaterally.  Gait: Unable to assess  ASSESSMENT AND PLAN: [name] is a 61 y.o. 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  with with LE paresis, S/P Lumbar drain Resultant Symptoms - flaccid paralysis of both lower extremities. Can wiggle toes weakly.  MRI - extensive abnormal T2-hyperintense signal w/in central spinal cord T9 -distal tip of the conus medullaris Follow up MRI Lumbar/Cervical and CT Lumbar pending - today after planned surgery Empiric IV solumedrol 1000 mg x 1 administered overnight - no improvement in symptoms Lumbar Drain at 10-15 cc/hr, set to maintain CSF pressure at 8-12 cm H2O - blood tinged Continue to avoid Hypotension Prognosis is guarded  HYPOTENSION WITH HX OF HTN: Stable this morning  Management per CCM Team  HYPOTHERMIA: Likely  neurogenic, no signs of infectious process at this time Will monitor closely  LEUKOCYTOSIS Improved - 7.2 today Remains afebrile Blood Cx pending  Other Stroke Risk Factors:  Advanced age  Other Active Problems:  CAD s/p Card Cath in 2018/ History of PVD s/p Ao FEm Fem bypass graft/ History of Ao dissection s/p repain 12/2016   CKD with possible Acute Kidney Injury   Hospital day # 1   Brita Romp Stroke Neurology Team 03/08/2017 12:55 PM I have personally examined this patient, reviewed notes, independently viewed imaging studies, participated in medical decision making and plan of care.ROS completed by me personally and pertinent positives fully documented  I have made any additions or clarifications directly to the above note. Agree with note above. He has presented with sudden onset of paraparesis with sensory level at T10 and MRI scan showing spinal cord hyperintensity from T9 down likely compatible with spinal cord infarct given his history of recent aortic arch surgery. His prognosis is guarded. Lumbar drain and steroids have not helped him so far. Recommend complete spinal imaging after he undergoes his subclavian embolectomy. No family available at the bedside for discussion. Discussed with Dr.  McClung. This patient is critically ill and at significant risk of neurological worsening, death and care requires constant monitoring of vital signs, hemodynamics,respiratory and cardiac monitoring, extensive review of multiple databases, frequent neurological assessment, discussion with family, other specialists and medical decision making of high complexity.I have made any additions or clarifications directly to the above note.This critical care time does not reflect procedure time, or teaching time or supervisory time of PA/NP/Med Resident etc but could involve care discussion time.  I spent 30 minutes of neurocritical care time  in the care of  this patient.      Delia Heady, MD Medical Director [facility] Pager: [phone] 03/08/2017 2:22 PM  To contact Stroke Continuity provider, please refer to [email or web address]. After hours, contact General Neurology

## 2017-03-08 NOTE — Progress Notes (Signed)
Anesthesiology Follow-up:  Patient underwent attempted thrombectomy of L. Subclavian artery without success. He received 7,000 units of heparin during the procedure. Will plan to d/c lumbar CSF drain tomorrow.   Pre-op Lumbar CT (-) for epidural hematoma.  Kipp Broodavid Davette Nugent

## 2017-03-08 NOTE — Anesthesia Preprocedure Evaluation (Signed)
Anesthesia Evaluation  Patient identified by MRN, date of birth, ID band Patient awake    Reviewed: Allergy & Precautions, NPO status , Patient's Chart, lab work & pertinent test results  Airway Mallampati: II  TM Distance: >3 FB Neck ROM: Full    Dental  (+) Dental Advisory Given,    Pulmonary former smoker,    breath sounds clear to auscultation       Cardiovascular hypertension,  Rhythm:Regular Rate:Normal     Neuro/Psych    GI/Hepatic   Endo/Other    Renal/GU      Musculoskeletal   Abdominal   Peds  Hematology   Anesthesia Other Findings   Reproductive/Obstetrics                             Anesthesia Physical Anesthesia Plan  ASA: III  Anesthesia Plan: General   Post-op Pain Management:    Induction: Intravenous  PONV Risk Score and Plan: 1 and Ondansetron and Dexamethasone  Airway Management Planned:   Additional Equipment:   Intra-op Plan:   Post-operative Plan: Extubation in OR  Informed Consent: I have reviewed the patients History and Physical, chart, labs and discussed the procedure including the risks, benefits and alternatives for the proposed anesthesia with the patient or authorized representative who has indicated his/her understanding and acceptance.   Dental advisory given  Plan Discussed with: CRNA and Anesthesiologist  Anesthesia Plan Comments:         Anesthesia Quick Evaluation

## 2017-03-08 NOTE — Progress Notes (Signed)
OT Cancellation Note  Patient Details Name: Karna ChristmasMarcus E Clewis MRN: [id number] DOB: 06/03/1955   Cancelled Treatment:    Reason Eval/Treat Not Completed: Medical issues which prohibited therapy (pt on bedrest and await ability for increased activity)    Evette GeorgesLeonard, Breandan People Eva [phone] 03/08/2017, 9:07 AM

## 2017-03-09 ENCOUNTER — Encounter (HOSPITAL_COMMUNITY): Payer: Self-pay | Admitting: Vascular Surgery

## 2017-03-09 LAB — GLUCOSE, CAPILLARY: GLUCOSE-CAPILLARY: 94 mg/dL (ref 65–99)

## 2017-03-09 MED ORDER — CHLORPROMAZINE HCL 10 MG PO TABS
10.0000 mg | ORAL_TABLET | Freq: Once | ORAL | Status: AC
  Administered 2017-03-09: 10 mg via ORAL
  Filled 2017-03-09: qty 1

## 2017-03-09 NOTE — Progress Notes (Signed)
PT Cancellation Note  Patient Details Name: Victor ChristmasMarcus E Kovacevic MRN: [id number] DOB: 1955/05/06   Cancelled Treatment:    Reason Eval/Treat Not Completed: Medical issues which prohibited therapy(pt on bedrest and await ability for increased activity)   Fabio Asaevon J Keland Peyton 03/09/2017, 8:15 AM

## 2017-03-09 NOTE — Progress Notes (Signed)
Orthopedic Tech Progress Note Patient Details:  Victor [name] Sep 05, 1955 [id number]  Ortho Devices Type of Ortho Device: Postop shoe/boot Ortho Device/Splint Location: (B) LE prafo boots Ortho Device/Splint Interventions: Ordered, Application   Jennye MoccasinHughes, Ricci Paff Craig 03/09/2017, 3:43 PM

## 2017-03-09 NOTE — Progress Notes (Signed)
Tech offered Pt a bath. Pt refused and requested that bath be done on next shift. RN notified regarding bath request. RN spoke with Pt and Pt continue to request bath to be done at a later time.

## 2017-03-09 NOTE — Addendum Note (Signed)
Addendum  created 03/09/17 1553 by Kipp BroodJoslin, Docie Abramovich, MD   Order list changed, Sign clinical note

## 2017-03-09 NOTE — Progress Notes (Signed)
Anesthesiology Follow-up:  Awake and alert, some mild improvement in strength and sensation in LEs, still unable to lift legs off the bed.  VS: T-  36.8 BP- 128/77 HR- 63 RR- 16 O2 Sat 98% on RA  Spinal drain working well, CSF slightly pink tinged.  Plan to continue CSF drainage today and D/C spinal drain tomorrow.  Victor [name]

## 2017-03-09 NOTE — Progress Notes (Signed)
[facility] TEAM 1 - Stepdown/ICU TEAM  Victor Victor [name]  ZOX:[id number] DOB: 03-02-1956 DOA: 03/07/2017 PCP: Victor Victor [name]    Brief Narrative:  61 y.o. male with a history of aortic arch replacement with bypass to his innominate, left cca and left subclavian arteries followed by TEVAR for dissection with aneurysm on 01/21/17. The night prior to his presentation he noted that his legs were weak and he could not walk (about 2AM). He presented to the ED at 3AM at which time a workup was begun.  Boney spine injury was first ruled out w/ xrays and CT.  A lumbar MRI was also unrevealing.  Neurology was then consulted, and a thoracic MRI was obtained which was c/w a probable spinal cord infarction.  TRH was asked to see the pt for admission, and discussed the case w/ TCTS, who suggested a CTangio as well a a lumbar drain.  CTa revealed luminal thrombus and severe stenosis of the L proximal subclavian.       Significant Events: 11/12 admit - lumbar drain placed by Anesthesia  11/13 to OR - failed attempt at L brachial artery thrombectomy   Subjective: Pt is resting comfortably in bed.  He denies cp, sob, or abdom pain.    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 rate 15, height 5\' 7"  (1.702 m), weight 72.1 kg (158 lb 15.2 oz), SpO2 97 %.  Intake/Output Summary (Last 24 hours) at 03/09/2017 1725 Last  data filed at 03/09/2017 1600 Gross per 24 hour  Intake 2475 ml  Output 2163 ml  Net 312 ml   Filed Weights   03/07/17 0311 03/08/17 0300 03/09/17 0500  Weight: 69.9 kg (154 lb) 71 kg (156 lb 8 oz) 72.1 kg (158 lb 15.2 oz)    Examination: General: No acute respiratory distress Lungs: Clear to auscultation bilaterally without wheezes or crackles Cardiovascular: Regular rate and rhythm  Abdomen: Nontender, nondistended, soft, bowel sounds positive Extremities: No significant edema bilateral lower extremities  CBC: Recent Labs  Lab 03/07/17 0327 03/08/17 0401  WBC 11.1* 7.2  NEUTROABS 7.3  --   HGB 11.9* 10.1*  HCT 40.8 32.3*  MCV 87.2 84.3  PLT 236 210   Basic Metabolic Panel: Recent Labs  Lab 03/07/17 0327 03/08/17 0401  NA 139 138  K 3.8 4.0  CL 108 108  CO2 19* 24  GLUCOSE 117* 117*  BUN 16 12  CREATININE 1.49* 1.01  CALCIUM 8.7* 8.3*   GFR: Estimated Creatinine Clearance: 72.7 mL/min (by C-G formula based on SCr of 1.01 mg/dL).  Liver Function Tests: Recent Labs  Lab 03/07/17 0327 03/08/17 0401  AST 19 11*  ALT 12* 10*  ALKPHOS 92 76  BILITOT 1.1 0.9  PROT 6.9 5.6*  ALBUMIN 3.5 2.9*    Coagulation Profile: Recent Labs  Lab 03/08/17 0401  INR 1.35    HbA1C: Hgb A1c MFr Bld  Date/Time  Value Ref Range Status  01/19/2017 10:11 AM 5.8 (H) 4.8 - 5.6 % Final    Comment:    (NOTE) Pre diabetes:          5.7%-6.4% Diabetes:              >6.4% Glycemic control for   <7.0% adults with diabetes     CBG: Recent Labs  Lab 03/09/17 1613  GLUCAP 94    Recent Results (from the past 240 hour(s))  Culture, Victor [name] (Routine X 2) w Reflex to ID Panel     Status: None (Preliminary result)   Collection Time: 03/07/17  9:35 AM  Result Value Ref Range Status   Specimen Description Victor [name] LEFT ANTECUBITAL  Final   Special Requests   Final    BOTTLES DRAWN AEROBIC AND ANAEROBIC Victor [name] Culture adequate volume   Culture NO GROWTH 2 DAYS  Final   Report  Status PENDING  Incomplete  Culture, Victor [name] (Routine X 2) w Reflex to ID Panel     Status: None (Preliminary result)   Collection Time: 03/07/17  9:50 AM  Result Value Ref Range Status   Specimen Description Victor [name] LEFT HAND  Final   Special Requests   Final    BOTTLES DRAWN AEROBIC AND ANAEROBIC Victor [name] Culture adequate volume   Culture NO GROWTH 2 DAYS  Final   Report Status PENDING  Incomplete     Scheduled Meds: . aspirin EC  81 mg Oral Daily  . docusate sodium  100 mg Oral Daily  . LORazepam  2 mg Intravenous Once  . losartan  25 mg Oral Daily  . metoprolol tartrate  12.5 mg Oral BID  . pantoprazole  40 mg Oral Daily     LOS: 2 days   Victor BloodJeffrey T. Nikolaj Geraghty, [name] [facility] Office  [phone] Pager - Text Page per Loretha StaplerAmion as per below:  On-Call/Text Page:      [email or web address]      password [email or web address]  If 7PM-7AM, please contact night-coverage [email or web address] Password [email or web address] 03/09/2017, 5:25 PM

## 2017-03-09 NOTE — Progress Notes (Signed)
STROKE TEAM PROGRESS NOTE   SUBJECTIVE (INTERVAL HISTORY) No family is at the bedside.   Overall he feels his condition is unchanged.  Voices no new complaints. No new events reported overnight. Lumbar epidural drain continues to drain well. Patient reports some improvement in strength and sensation.  OBJECTIVE No results for input(s): GLUCAP in the last 168 hours. Recent Labs  Lab 03/07/17 0327 03/08/17 0401  NA 139 138  K 3.8 4.0  CL 108 108  CO2 19* 24  GLUCOSE 117* 117*  BUN 16 12  CREATININE 1.49* 1.01  CALCIUM 8.7* 8.3*   Recent Labs  Lab 03/07/17 0327 03/08/17 0401  AST 19 11*  ALT 12* 10*  ALKPHOS 92 76  BILITOT 1.1 0.9  PROT 6.9 5.6*  ALBUMIN 3.5 2.9*   Recent Labs  Lab 03/07/17 0327 03/08/17 0401  WBC 11.1* 7.2  NEUTROABS 7.3  --   HGB 11.9* 10.1*  HCT 40.8 32.3*  MCV 87.2 84.3  PLT 236 210   No results for input(s): CKTOTAL, CKMB, CKMBINDEX, TROPONINI in the last 168 hours. Recent Labs    03/08/17 0401  LABPROT 16.6*  INR 1.35   Recent Labs    03/07/17 1143  COLORURINE YELLOW  LABSPEC 1.042*  PHURINE 5.0  GLUCOSEU NEGATIVE  HGBUR NEGATIVE  BILIRUBINUR NEGATIVE  KETONESUR NEGATIVE  PROTEINUR NEGATIVE  NITRITE NEGATIVE  LEUKOCYTESUR NEGATIVE       Component Value Date/Time   CHOL 121 12/23/2016 0813   TRIG 57 12/23/2016 0813   HDL 43 12/23/2016 0813   CHOLHDL 2.8 12/23/2016 0813   CHOLHDL 2.5 02/03/2015 0936   VLDL 12 02/03/2015 0936   LDLCALC 67 12/23/2016 0813   Lab Results  Component Value Date   HGBA1C 5.8 (H) 01/19/2017   No results found for: LABOPIA, COCAINSCRNUR, LABBENZ, AMPHETMU, THCU, LABBARB  No results for input(s): ETH in the last 168 hours.  IMAGING: I have personally reviewed the radiological images below and agree with the radiology interpretations.  Ct Angio Neck W Or Wo Contrast Result Date: 03/07/2017 IMPRESSION: 1. Interval aortic dissection repair with arch debranching. There is luminal thrombus and  severe stenosis of the left proximal subclavian. The other great vessels are widely patent. No embolic disease seen in the neck; no left vertebral stenosis. 2. Chest CTA reported separately. Electronically Signed   By: Marnee SpringJonathon  Watts M.D.   On: 03/07/2017 13:00   Ct Cervical Spine Wo Contrast Result Date: 03/07/2017  IMPRESSION: 1. No definite acute/traumatic cervical spine pathology. Evaluation however is limited due to motion artifact. 2. Nondisplaced fracture of the right first rib at the costovertebral junction, possibly acute. Clinical correlation is recommended. Electronically Signed   By: Elgie CollardArash  Radparvar M.D.   On: 03/07/2017 04:12   Mr Lumbar Spine Wo Contrast Result Date: 03/07/2017 IMPRESSION: 1. Minimal grade 1 L5-S1 anterolisthesis. Chronic RIGHT L5 pars interarticularis defect. No acute osseous process. 2. Degenerative change of the lumbar spine. 3. No canal stenosis. Neural foraminal narrowing L2-3 through L5-S1: Mild moderate on the RIGHT at L3-4. Electronically Signed   By: Awilda Metroourtnay  Bloomer M.D.   On: 03/07/2017 05:13   Mr Thoracic Spine Limited Wo Contrast Result Date: 03/07/2017 IMPRESSION: 1. The examination had to be discontinued prior to completion, but nonetheless the lower thoracic spinal cord signal appears abnormal from the lower T9 level to the conus. The constellation of clinical and imaging findings were discussed by telephone with Dr. Gwenevere AbbotEric Lindezen On 03/07/2017 at 0843 hours, who advised that on  exam the patient has at T10 cord sensory level. We discussed that the combination of abrupt onset lower extremity weakness, known abnormal aorta, and central appearing cord signal abnormality are suspicious for spinal cord infarct. 2. No thoracic spinal stenosis and mild for age thoracic spine degeneration except at the cervicothoracic junction where mild anterolisthesis is associated with facet arthropathy. Electronically Signed   By: Odessa FlemingH  Hall M.D.   On: 03/07/2017 08:57   Ct  Angio Chest/abd/pel For Dissection W And/or W/wo Result Date: 03/07/2017  IMPRESSION: Status post surgical repair of ascending thoracic aortic dissection. Interval placement of stent graft extending from proximal transverse aortic arch and through descending thoracic aorta into abdominal aorta. There is nearly complete thrombosis of false lumen since prior exam. Aneurysmal dilatation of descending thoracic aorta is noted with maximum diameter of 6.2 cm. Abdominal aortic dissection is unchanged which extends to infrarenal abdominal aorta. Stable 5 cm proximal abdominal aortic aneurysm is noted. The celiac and right renal arteries arise predominantly from the false lumen. Stable large left inguinal hernia is noted which contains loops of small bowel, but does not result in incarceration or obstruction. 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 Stable emphysematous disease is noted in both upper lobes. Electronically Signed   By: Lupita RaiderJames  Green Jr, M.D.   On: 03/07/2017 13:01   ECHO TEE (OR)    01/21/17  Result status: Final result   Left ventricle: Concentric hypertrophy of moderate severity. LV systolic function is mildly reduced with an EF of 45-50%. There are no obvious wall motion abnormalities.  Septum: No Patent Foramen Ovale present.  Left atrium: Patent foramen ovale not present.  Aortic valve: The valve is trileaflet. Trace regurgitation.  Aorta: The aortic root is dilated. Aneurysm present in the transverse aorta and descending aorta. DeBakey type III dissection present between the ascending aorta and the descending aorta.  Mitral valve: Trace regurgitation.  Right ventricle: Normal cavity size.  Tricuspid valve: Mild regurgitation.  Aorta: Graft present in the descending aorta and transverse aorta.      REPEAT MRI/MRA - PENDING  PHYSICAL EXAM Temp:  [97.6 F (36.4 C)-98.4 F  (36.9 C)] 98.2 F (36.8 C) (11/14 1248) Pulse Rate:  [63-78] 65 (11/14 0900) Resp:  [10-20] 12 (11/14 0900) BP: (113-136)/(70-87) 127/80 (11/14 0900) SpO2:  [96 %-100 %] 96 % (11/14 0900) Weight:  [72.1 kg (158 lb 15.2 oz)] 72.1 kg (158 lb 15.2 oz) (11/14 0500)  General - Well nourished, well developed, in no apparent distress Respiratory - Lungs clear bilaterally. No wheezing. Cardiovascular - Regular rate and rhythm, +pulses in feet bilaterally   Neurological Examination Mental Status: Alert and fully oriented.  Speech fluent with intact comprehension and naming. Able to follow or demonstrate accurate comprehension of all motor commands without difficulty. Cranial Nerves: II: Visual fields intact. PERRL.   III,IV, VI: EOMI. No nystagmus.  V,VII: Smile symmetric, facial temp sensation normal bilaterally VIII: hearing intact to conversation IX,X: No hypophonia, nasal speech or hoarseness XI: Symmetric shoulder shrug XII: midline tongue extension Motor: Bilateral upper extremities 5/5 proximal and distal with normal tone.  B/L LE's with mostly flaccid tone at hips.Some weak hip movement noted in Left leg today. Able to kick out Right leg with support. Moves LE's Right side greater than Left. Able to weakly wiggle toes bilaterally. No clonus with rapid passive dorsiflexion at ankles. No reflex movement to  noxious plantar stimulation bilaterally.  Sensory: Proprioception and vibration sensation preserved bilaterally below the level of the umbilicus (T10 sensory level). Some retained sensation to lower extremities, including acute discomfort with noxious plantar stimulation. Sensation above umbilicus is normal.  Deep Tendon Reflexes: 3+ biceps and brachioradialis bilaterally. 0 patellae and achilles bilaterally with and without reinforcement maneuver. Toes are mute bilaterally.  Positive umbilical sign  with umbilicus mowing upwards when patient flexes his neck Cerebellar: No ataxia with FNF  bilaterally.  Gait: Unable to assess  ASSESSMENT AND PLAN: Victor [name] is a 61 y.o. 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  Surgery 01/21/17: Aortic type 1 dissection s/p repair on 01/21/2017 which included the following #1: Re-do sternotomy #2: Aortic arch debranching with circulatory arrest: aorto to Innominate (10mm), aorto to left carotid artery (8mm), Aorto to left subclavian bypass (8mm) #3: TEVAR #4: Proximal extension x 2 #5: IVUS of the aortic arch and descending thoracic aorta, and upper abdominal aorta, including visceral vessels (celiac, SMA, bilateral renal arteries) #6: Thoracic and abdominal aortogram  03/08/17: He has presented with sudden onset of paraparesis with sensory level at T10 and MRI scan showing spinal cord hyperintensity from T9 down likely compatible with spinal cord infarct given his history of recent aortic arch surgery. His prognosis is guarded. Lumbar drain and steroids have not helped him so far. Recommend complete spinal imaging after he undergoes his subclavian embolectomy. No family available at the bedside for discussion. Discussed with Dr. Sharon Seller.  03/09/17: Thrombosis of left subclavian artery-Unsuccessful attempt at thrombectomy of left subclavian artery yesterday. Neuro exam slightly improved. Able to move LE's better today and some increased sensation at thighs noted. Persevered vibration/temperature to knees. Proprioception preserved throughout. Will need intensive PT/OT once Lumbar drain discontinued.   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  with with LE paresis, S/P Lumbar drain Resultant Symptoms - paralysis of both lower extremities - some improvement in  exam today.  MRI - extensive abnormal T2-hyperintense signal w/in central spinal cord T9 -distal tip of the conus medullaris Follow up MRI Lumbar/Cervical and CT Lumbar - pending  Recommendations: Prognosis continues to be guarded but more movement of LE noted on exam today Lumbar Drain - to be discontinued in AM tomorrow Continue to avoid Hypotension Tight Blood sugar control Aggressive PT/OT once Lumbar drain discontinue and cleared by Anesthesia/Surgery  Other Stroke Risk Factors:  Advanced age  LDL - 67, 12/23/16, no need for statin therapy at this time  HgA1C - 5.8, 01/19/17  Other Active Problems:  CAD s/p Card Cath in 2018/ History of PVD s/p Ao FEm Fem bypass graft/ History of Ao dissection s/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 Agree with note above. . Discussed with Dr. Tyrone Sage and Dr. Sharon Seller. Continue lumbar drain for 1 more day and discontinue. MRI of the spine  can wait till the lumbar drain is removed. Greater than 50% time during this 25 minute visit was spent on counseling and coordination of care about a spinal cord infarct and answered questions  Delia Heady, MD Medical Director [facility] Pager: [phone] 03/09/2017 2:24 PM  To contact Stroke Continuity provider, please refer to [email or web address]. After hours, contact General Neurology

## 2017-03-09 NOTE — Progress Notes (Signed)
OT Cancellation    03/09/17 0700  OT Visit Information  Last OT Received On 03/09/17  Reason Eval/Treat Not Completed Medical issues which prohibited therapy (Pt on bedrest. Will return as pt is medically ready.)   Perez Dirico MSOT, OTR/L Acute Rehab Pager: [phone] Office: [phone]

## 2017-03-09 NOTE — Progress Notes (Addendum)
      [address]       [name] [id number]             [phone]        1 Day Post-Op Procedure(s) (LRB): THROMBECTOMY of Left subclavian artery (Left)  Subjective: Patient asking for specifics of yesterday's surgery.  Objective: Vital signs in last 24 hours: Temp:  [97.6 F (36.4 C)-98.4 F (36.9 C)] 98.3 F (36.8 C) (11/14 0000) Pulse Rate:  [63-78] 63 (11/14 0700) Cardiac Rhythm: Normal sinus rhythm (11/14 0000) Resp:  [10-20] 13 (11/14 0700) BP: (113-136)/(64-98) 119/80 (11/14 0700) SpO2:  [96 %-100 %] 98 % (11/14 0700) Weight:  [158 lb 15.2 oz (72.1 kg)] 158 lb 15.2 oz (72.1 kg) (11/14 0500)   Current Weight  03/09/17 158 lb 15.2 oz (72.1 kg)      Intake/Output from previous day: 11/13 0701 - 11/14 0700 In: 1850 [I.V.:1850] Out: 1579 [Urine:1225; Drains:279; Blood:75]   Physical Exam:  Cardiovascular: RRR Pulmonary: Clear to auscultation bilaterally Extremities: SCDs in place. Palpable pulses in feet. Able to move toes/feet but unable to lift legs. Lumbar drain: blood tinged output  Lab Results: CBC: Recent Labs    03/07/17 0327 03/08/17 0401  WBC 11.1* 7.2  HGB 11.9* 10.1*  HCT 40.8 32.3*  PLT 236 210   BMET:  Recent Labs    03/07/17 0327 03/08/17 0401  NA 139 138  K 3.8 4.0  CL 108 108  CO2 19* 24  GLUCOSE 117* 117*  BUN 16 12  CREATININE 1.49* 1.01  CALCIUM 8.7* 8.3*    PT/INR:  Lab Results  Component Value Date   INR 1.35 03/08/2017   INR 1.09 01/21/2017   INR 1.16 01/21/2017   ABG:  INR: Will add last result for INR, ABG once components are confirmed Will add last 4 CBG results once components are confirmed  Assessment/Plan:  1. CV - SR in the 60's. 2.  Pulmonary - On room air.  3. Thrombosis of left subclavian artery-Unsuccessful attempt at thrombectomy of left subclavian artery yesterday. Vascular surgery to evaluate. 4. Bilateral LE paralysis likely secondary to spinal cord infarct-Lumbar CSF  drain likely to be removed by anesthesia today  ZIMMERMAN,Victor [name] MPA-C 03/09/2017,7:40 AM  Some slight improvement in neuro status from yesterday, but still dense defect. Sensation has improved , dorsiflexion of feet not present, has bilateral planter flexion. Pulse in left arm easily palpable. Suggest leaving drain today and likely d/c tomorrow so we can move toward rehab   I have seen and examined Victor [name] and agree with the above assessment  and plan.  Delight OvensEdward B Abdias Hickam MD [facility] [phone] Office [phone] 03/09/2017 10:16 AM

## 2017-03-10 ENCOUNTER — Inpatient Hospital Stay (HOSPITAL_COMMUNITY): Payer: Medicaid Other

## 2017-03-10 DIAGNOSIS — Z9889 Other specified postprocedural states: Secondary | ICD-10-CM

## 2017-03-10 LAB — COMPREHENSIVE METABOLIC PANEL
ALBUMIN: 2.7 g/dL — AB (ref 3.5–5.0)
ALT: 10 U/L — AB (ref 17–63)
ANION GAP: 6 (ref 5–15)
AST: 11 U/L — AB (ref 15–41)
Alkaline Phosphatase: 70 U/L (ref 38–126)
BILIRUBIN TOTAL: 0.8 mg/dL (ref 0.3–1.2)
BUN: 17 mg/dL (ref 6–20)
CALCIUM: 7.9 mg/dL — AB (ref 8.9–10.3)
CHLORIDE: 107 mmol/L (ref 101–111)
CO2: 24 mmol/L (ref 22–32)
CREATININE: 1.09 mg/dL (ref 0.61–1.24)
GFR calc Af Amer: >60 mL/min (ref >60)
GFR calc non Af Amer: >60 mL/min (ref >60)
Glucose, Bld: 91 mg/dL (ref 65–99)
Potassium: 3.7 mmol/L (ref 3.5–5.1)
Sodium: 137 mmol/L (ref 135–145)
Total Protein: 5.3 g/dL — ABNORMAL LOW (ref 6.5–8.1)

## 2017-03-10 LAB — CBC
HEMATOCRIT: 34 % — AB (ref 39.0–52.0)
Hemoglobin: 10.7 g/dL — ABNORMAL LOW (ref 13.0–17.0)
MCH: 26.2 pg (ref 26.0–34.0)
MCHC: 31.5 g/dL (ref 30.0–36.0)
MCV: 83.3 fL (ref 78.0–100.0)
PLATELETS: 192 10*3/uL (ref 150–400)
RBC: 4.08 MIL/uL — AB (ref 4.22–5.81)
RDW: 15 % (ref 11.5–15.5)
WBC: 9.4 10*3/uL (ref 4.0–10.5)

## 2017-03-10 MED ORDER — LORAZEPAM 2 MG/ML IJ SOLN
1.0000 mg | Freq: Once | INTRAMUSCULAR | Status: AC
  Administered 2017-03-10: 1 mg via INTRAVENOUS
  Filled 2017-03-10: qty 1

## 2017-03-10 NOTE — Progress Notes (Signed)
[place] TEAM 1 - Stepdown/ICU TEAM  Karna ChristmasMarcus E Propps  ONG:[id number] DOB: 05-16-1955 DOA: 03/07/2017 PCP: Billee CashingMcKenzie, [place], MD    Brief Narrative:  61 y.o. male with a history of aortic arch replacement with bypass to his innominate, left cca and left subclavian arteries followed by TEVAR for dissection with aneurysm on 01/21/17. The night prior to his presentation he noted that his legs were weak and he could not walk (about 2AM). He presented to the ED at 3AM at which time a workup was begun.  Boney spine injury was first ruled out w/ xrays and CT.  A lumbar MRI was also unrevealing.  Neurology was then consulted, and a thoracic MRI was obtained which was c/w a probable spinal cord infarction.  TRH was asked to see the pt for admission, and discussed the case w/ TCTS, who suggested a CTangio as well a a lumbar drain.  CTa revealed luminal thrombus and severe stenosis of the L proximal subclavian.       Significant Events: 11/12 admit - lumbar drain placed by Anesthesia  11/13 to OR - failed attempt at L brachial artery thrombectomy   Subjective: No outstanding medical issues at this time beyond the care of Neurology, TCTS, and Vascular Surgery.    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 rate 11, height 5\' 7"  (1.702 m), weight 72.1 kg (158 lb 15.2 oz), SpO2 97  %.  Intake/Output Summary (Last 24 hours) at 03/10/2017 1609 Last data filed at 03/10/2017 1500 Gross per 24 hour  Intake 1725 ml  Output 1320 ml  Net 405 ml   Filed Weights   03/07/17 0311 03/08/17 0300 03/09/17 0500  Weight: 69.9 kg (154 lb) 71 kg (156 lb 8 oz) 72.1 kg (158 lb 15.2 oz)    Examination: No indication for medical exam today   CBC: Recent Labs  Lab 03/07/17 0327 03/08/17 0401 03/10/17 0302  WBC 11.1* 7.2 9.4  NEUTROABS 7.3  --   --   HGB 11.9* 10.1* 10.7*  HCT 40.8 32.3* 34.0*  MCV 87.2 84.3 83.3  PLT 236 210 192   Basic Metabolic Panel: Recent Labs  Lab 03/07/17 0327 03/08/17 0401 03/10/17 0302  NA 139 138 137  K 3.8 4.0 3.7  CL 108 108 107  CO2 19* 24 24  GLUCOSE 117* 117* 91  BUN 16 12 17   CREATININE 1.49* 1.01 1.09  CALCIUM 8.7* 8.3* 7.9*   GFR: Estimated Creatinine Clearance: 67.4 mL/min (by C-G formula based on SCr of 1.09 mg/dL).  Liver Function Tests: Recent Labs  Lab 03/07/17 0327 03/08/17 0401 03/10/17 0302  AST 19 11* 11*  ALT 12* 10* 10*  ALKPHOS 92 76 70  BILITOT 1.1 0.9 0.8  PROT 6.9 5.6* 5.3*  ALBUMIN 3.5 2.9* 2.7*    Coagulation Profile: Recent Labs  Lab 03/08/17 0401  INR 1.35    HbA1C: Hgb A1c MFr  Bld  Date/Time Value Ref Range Status  01/19/2017 10:11 AM 5.8 (H) 4.8 - 5.6 % Final    Comment:    (NOTE) Pre diabetes:          5.7%-6.4% Diabetes:              >6.4% Glycemic control for   <7.0% adults with diabetes     CBG: Recent Labs  Lab 03/09/17 1613  GLUCAP 94    Recent Results (from the past 240 hour(s))  Culture, blood (Routine X 2) w Reflex to ID Panel     Status: None (Preliminary result)   Collection Time: 03/07/17  9:35 AM  Result Value Ref Range Status   Specimen Description BLOOD LEFT ANTECUBITAL  Final   Special Requests   Final    BOTTLES DRAWN AEROBIC AND ANAEROBIC Blood Culture adequate volume   Culture NO GROWTH 3 DAYS  Final   Report Status PENDING  Incomplete  Culture,  blood (Routine X 2) w Reflex to ID Panel     Status: None (Preliminary result)   Collection Time: 03/07/17  9:50 AM  Result Value Ref Range Status   Specimen Description BLOOD LEFT HAND  Final   Special Requests   Final    BOTTLES DRAWN AEROBIC AND ANAEROBIC Blood Culture adequate volume   Culture NO GROWTH 3 DAYS  Final   Report Status PENDING  Incomplete     Scheduled Meds: . aspirin EC  81 mg Oral Daily  . docusate sodium  100 mg Oral Daily  . losartan  25 mg Oral Daily  . metoprolol tartrate  12.5 mg Oral BID  . pantoprazole  40 mg Oral Daily     LOS: 3 days   Lonia Blood, MD [facility] Office  [phone] Pager - Text Page per Loretha Stapler as per below:  On-Call/Text Page:      [email or web address]      password [email or web address]  If 7PM-7AM, please contact night-coverage [email or web address] Password [email or web address] 03/10/2017, 4:09 PM

## 2017-03-10 NOTE — Progress Notes (Signed)
OT Cancellation Note  Patient Details Name: Karna ChristmasMarcus E Covino MRN: [id number] DOB: [birth year]   Cancelled Treatment:    Reason Eval/Treat Not Completed: Patient not medically ready(active bedrest orders)  Gaye AlkenBailey A Nilesh Stegall M.S., OTR/L Pager: [phone]  03/10/2017, 7:12 AM

## 2017-03-10 NOTE — Progress Notes (Signed)
STROKE TEAM PROGRESS NOTE   SUBJECTIVE (INTERVAL HISTORY) No family is at the bedside.   Overall he feels his condition is unchanged.  Voices no new complaints. No new events reported overnight. Lumbar epidural drain continues to drain well. Patient reports some improvement in  LE sensation.  OBJECTIVE Recent Labs  Lab 03/09/17 1613  GLUCAP 94   Recent Labs  Lab 03/07/17 0327 03/08/17 0401 03/10/17 0302  NA 139 138 137  K 3.8 4.0 3.7  CL 108 108 107  CO2 19* 24 24  GLUCOSE 117* 117* 91  BUN 16 12 17   CREATININE 1.49* 1.01 1.09  CALCIUM 8.7* 8.3* 7.9*   Recent Labs  Lab 03/07/17 0327 03/08/17 0401 03/10/17 0302  AST 19 11* 11*  ALT 12* 10* 10*  ALKPHOS 92 76 70  BILITOT 1.1 0.9 0.8  PROT 6.9 5.6* 5.3*  ALBUMIN 3.5 2.9* 2.7*   Recent Labs  Lab 03/07/17 0327 03/08/17 0401 03/10/17 0302  WBC 11.1* 7.2 9.4  NEUTROABS 7.3  --   --   HGB 11.9* 10.1* 10.7*  HCT 40.8 32.3* 34.0*  MCV 87.2 84.3 83.3  PLT 236 210 192   No results for input(s): CKTOTAL, CKMB, CKMBINDEX, TROPONINI in the last 168 hours. Recent Labs    03/08/17 0401  LABPROT 16.6*  INR 1.35   No results for input(s): COLORURINE, LABSPEC, PHURINE, GLUCOSEU, HGBUR, BILIRUBINUR, KETONESUR, PROTEINUR, UROBILINOGEN, NITRITE, LEUKOCYTESUR in the last 72 hours.  Invalid input(s): APPERANCEUR     Component Value Date/Time   CHOL 121 12/23/2016 0813   TRIG 57 12/23/2016 0813   HDL 43 12/23/2016 0813   CHOLHDL 2.8 12/23/2016 0813   CHOLHDL 2.5 02/03/2015 0936   VLDL 12 02/03/2015 0936   LDLCALC 67 12/23/2016 0813   Lab Results  Component Value Date   HGBA1C 5.8 (H) 01/19/2017   No results found for: LABOPIA, COCAINSCRNUR, LABBENZ, AMPHETMU, THCU, LABBARB  No results for input(s): ETH in the last 168 hours.  IMAGING: I have personally reviewed the radiological images below and agree with the radiology interpretations.  Ct Angio Neck W Or Wo Contrast Result Date: 03/07/2017 IMPRESSION: 1.  Interval aortic dissection repair with arch debranching. 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 The other great vessels are widely patent. No embolic disease seen in the neck; no left vertebral stenosis. 2. Chest CTA reported separately. Electronically Signed   By: Marnee Spring M.D.   On: 03/07/2017 13:00   Ct Cervical Spine Wo Contrast Result Date: 03/07/2017  IMPRESSION: 1. No definite acute/traumatic cervical spine pathology. Evaluation however is limited due to motion artifact. 2. Nondisplaced fracture of the right first rib at the costovertebral junction, possibly acute. Clinical correlation is recommended. Electronically Signed   By: Elgie Collard M.D.   On: 03/07/2017 04:12   Mr Lumbar Spine Wo Contrast Result Date: 03/07/2017 IMPRESSION: 1. Minimal grade 1 L5-S1 anterolisthesis. Chronic RIGHT L5 pars interarticularis defect. No acute osseous process. 2. Degenerative change of the lumbar spine. 3. No canal stenosis. Neural foraminal narrowing L2-3 through L5-S1: Mild moderate on the RIGHT at L3-4. Electronically Signed   By: Awilda Metro M.D.   On: 03/07/2017 05:13   Mr Thoracic Spine Limited Wo Contrast Result Date: 03/07/2017 IMPRESSION: 1. The examination had to be discontinued prior to completion, but nonetheless the lower thoracic spinal cord signal appears abnormal from the lower T9 level to the conus. The constellation of clinical and imaging findings were discussed by  telephone with Dr. Gwenevere AbbotEric Lindezen On 03/07/2017 at 0843 hours, who advised that on exam the patient has at T10 cord sensory level. We discussed that the combination of abrupt onset lower extremity weakness, known abnormal aorta, and central appearing cord signal abnormality are suspicious for spinal cord infarct. 2. No thoracic spinal stenosis and mild for age thoracic spine degeneration except at the cervicothoracic junction where mild anterolisthesis is associated with facet  arthropathy. Electronically Signed   By: Odessa FlemingH  Hall M.D.   On: 03/07/2017 08:57   Ct Angio Chest/abd/pel For Dissection W And/or W/wo Result Date: 03/07/2017  IMPRESSION: Status post surgical repair of ascending thoracic aortic dissection. Interval placement of stent graft extending from proximal transverse aortic arch and through descending thoracic aorta into abdominal aorta. There is nearly complete thrombosis of false lumen since prior exam. Aneurysmal dilatation of descending thoracic aorta is noted with maximum diameter of 6.2 cm. Abdominal aortic dissection is unchanged which extends to infrarenal abdominal aorta. Stable 5 cm proximal abdominal aortic aneurysm is noted. The celiac and right renal arteries arise predominantly from the false lumen. Stable large left inguinal hernia is noted which contains loops of small bowel, but does not result in incarceration or obstruction. 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 Stable emphysematous disease is noted in both upper lobes. Electronically Signed   By: Lupita RaiderJames  Green Jr, M.D.   On: 03/07/2017 13:01   ECHO TEE (OR)    01/21/17  Result status: Final result   Left ventricle: Concentric hypertrophy of moderate severity. LV systolic function is mildly reduced with an EF of 45-50%. There are no obvious wall motion abnormalities.  Septum: No Patent Foramen Ovale present.  Left atrium: Patent foramen ovale not present.  Aortic valve: The valve is trileaflet. Trace regurgitation.  Aorta: The aortic root is dilated. Aneurysm present in the transverse aorta and descending aorta. DeBakey type III dissection present between the ascending aorta and the descending aorta.  Mitral valve: Trace regurgitation.  Right ventricle: Normal cavity size.  Tricuspid valve: Mild regurgitation.  Aorta: Graft present in the descending aorta and transverse aorta.       REPEAT MRI/MRA - PENDING  PHYSICAL EXAM Temp:  [98.4 F (36.9 C)-98.6 F (37 C)] 98.4 F (36.9 C) (11/15 1200) Pulse Rate:  [59-82] 67 (11/15 1500) Resp:  [9-17] 11 (11/15 1500) BP: (109-146)/(63-97) 130/81 (11/15 1500) SpO2:  [95 %-100 %] 98 % (11/15 1500)  General - Well nourished, well developed, in no apparent distress Respiratory - Lungs clear bilaterally. No wheezing. Cardiovascular - Regular rate and rhythm, +pulses in feet bilaterally   Neurological Examination Mental Status: Alert and fully oriented.  Speech fluent with intact comprehension and naming. Able to follow or demonstrate accurate comprehension of all motor commands without difficulty. Cranial Nerves: II: Visual fields intact. PERRL.   III,IV, VI: EOMI. No nystagmus.  V,VII: Smile symmetric, facial temp sensation normal bilaterally VIII: hearing intact to conversation IX,X: No hypophonia, nasal speech or hoarseness XI: Symmetric shoulder shrug XII: midline tongue extension Motor: Bilateral upper extremities 5/5 proximal and distal with normal tone.  B/L LE's with mostly flaccid tone at hips.Some weak hip movement noted in Left leg today and minimal on right leg. Able to kick out Right leg with support. Moves LE's Right side greater than Left. Able to weakly wiggle toes bilaterally. No clonus with rapid passive dorsiflexion at ankles. No  reflex movement to noxious plantar stimulation bilaterally.  Sensory: Proprioception and vibration sensation preserved bilaterally below the level of the umbilicus (T10 sensory level). Some retained sensation to lower extremities, including acute discomfort with noxious plantar stimulation. Sensation above umbilicus is normal.  Deep Tendon Reflexes: 3+ biceps and brachioradialis bilaterally. 0 patellae and achilles bilaterally with and without reinforcement maneuver. Toes are mute bilaterally.  Positive umbilical sign  with umbilicus mowing upwards when patient flexes his  neck Cerebellar: No ataxia with FNF bilaterally.  Gait: Unable to assess  ASSESSMENT AND PLAN: Mr. Karna ChristmasMarcus E Pender is a 61 y.o. 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  Surgery 01/21/17: Aortic type 1 dissection s/p repair on 01/21/2017 which included the following #1: Re-do sternotomy #2: Aortic arch debranching with circulatory arrest: aorto to Innominate (10mm), aorto to left carotid artery (8mm), Aorto to left subclavian bypass (8mm) #3: TEVAR #4: Proximal extension x 2 #5: IVUS of the aortic arch and descending thoracic aorta, and upper abdominal aorta, including visceral vessels (celiac, SMA, bilateral renal arteries) #6: Thoracic and abdominal aortogram  03/08/17: He has presented with sudden onset of paraparesis with sensory level at T10 and MRI scan showing spinal cord hyperintensity from T9 down likely compatible with spinal cord infarct given his history of recent aortic arch surgery. His prognosis is guarded. Lumbar drain and steroids have not helped him so far. Recommend complete spinal imaging after he undergoes his subclavian embolectomy. No family available at the bedside for discussion. Discussed with Dr. Sharon SellerMcClung.  03/09/17: Thrombosis of left subclavian artery-Unsuccessful attempt at thrombectomy of left subclavian artery yesterday. Neuro exam slightly improved. Able to move LE's better today and some increased sensation at thighs noted. Persevered vibration/temperature to knees. Proprioception preserved throughout. Will need intensive PT/OT once Lumbar drain discontinued.   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  with with LE paresis, S/P Lumbar drain Resultant Symptoms - paralysis of both  lower extremities - some improvement in exam today.  MRI - extensive abnormal T2-hyperintense signal w/in central spinal cord T9 -distal tip of the conus medullaris Follow up MRI Lumbar/Cervical and CT Lumbar - pending  Recommendations: Prognosis continues to be guarded but more movement of LE noted on exam today Lumbar Drain - to be discontinued in AM tomorrow Continue to avoid Hypotension Tight Blood sugar control Aggressive PT/OT once Lumbar drain discontinue and cleared by Anesthesia/Surgery  Other Stroke Risk Factors:  Advanced age  LDL - 67, 12/23/16, no need for statin therapy at this time  HgA1C - 5.8, 01/19/17  Other Active Problems:  CAD s/p Card Cath in 2018/ History of PVD s/p Ao FEm Fem bypass graft/ 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 Discussed with Dr. Tyrone SageGerhardt and Dr. Sharon SellerMcClung. Discontinue lumbar drain and begin physical occupational therapy.Marland Kitchen. MRI of the spine with some sedation Greater than 50% time during this 25 minute visit was spent on counseling and coordination of care about a spinal cord infarct and answered questions  Delia HeadyPramod Amsi Grimley, MD Medical Director [facility] Pager: [phone] 03/10/2017 3:53 PM  To contact Stroke Continuity provider, please refer to [email or web address]. After hours, contact General  Neurology

## 2017-03-10 NOTE — Addendum Note (Signed)
Addendum  created 03/10/17 1512 by Kipp BroodJoslin, Kalya Troeger, MD   Sign clinical note

## 2017-03-10 NOTE — Progress Notes (Signed)
Anesthesiology Note  Neuro status unchanged. Lumbar CSF drain D/Ced. Site OK catheter tip intact. Gauze dressing applied to site.  Victor [name] Victor [name]

## 2017-03-10 NOTE — Progress Notes (Addendum)
      [address]       [name] [id number]             [phone]        2 Days Post-Op Procedure(s) (LRB): THROMBECTOMY of Left subclavian artery (Left)  Subjective: Patient sleeping. He was awakened. No specific complaints at this time.  Objective: Vital signs in last 24 hours: Temp:  [98.2 F (36.8 C)-98.6 F (37 C)] 98.6 F (37 C) (11/15 0400) Pulse Rate:  [60-82] 71 (11/15 0700) Cardiac Rhythm: Normal sinus rhythm (11/14 1941) Resp:  [10-17] 12 (11/15 0700) BP: (109-146)/(63-97) 119/68 (11/15 0700) SpO2:  [95 %-100 %] 97 % (11/15 0700)   Current Weight  03/09/17 158 lb 15.2 oz (72.1 kg)      Intake/Output from previous day: 11/14 0701 - 11/15 0700 In: 1800 [I.V.:1800] Out: 2091 [Urine:1712; Drains:379]   Physical Exam:  Cardiovascular: RRR Pulmonary: Clear to auscultation bilaterally Extremities: Palpable pulses in feet. Able to move toes/feet and has improved sensation. He is able to plantar flex both feet but NOT dorsiflex (has been this way since hospitalization) Lumbar drain: very light pink tinged output  Lab Results: CBC: Recent Labs    03/08/17 0401 03/10/17 0302  WBC 7.2 9.4  HGB 10.1* 10.7*  HCT 32.3* 34.0*  PLT 210 192   BMET:  Recent Labs    03/08/17 0401 03/10/17 0302  NA 138 137  K 4.0 3.7  CL 108 107  CO2 24 24  GLUCOSE 117* 91  BUN 12 17  CREATININE 1.01 1.09  CALCIUM 8.3* 7.9*    PT/INR:  Lab Results  Component Value Date   INR 1.35 03/08/2017   INR 1.09 01/21/2017   INR 1.16 01/21/2017   ABG:  INR: Will add last result for INR, ABG once components are confirmed Will add last 4 CBG results once components are confirmed  Assessment/Plan:  1. CV - SR in the 60's. 2.  Pulmonary - On room air.  3. Thrombosis of left subclavian artery-Unsuccessful attempt at thrombectomy of left subclavian artery yesterday. Vascular surgery to evaluate. 4. Bilateral LE paralysis likely secondary to spinal cord  infarct-Lumbar CSF drain likely to be removed by anesthesia today 5. Will need rehab   ZIMMERMAN,Victor [name] MPA-C 03/10/2017,7:50 AM  Patient examined this am, neuro function uncharged from yesterday  Likely further drainage will not improve, d/c drain today  I have seen and examined Victor [name] and agree with the above assessment  and plan.  Delight OvensEdward B Myrah Strawderman MD Bee[facility] [phone] Office [phone] 03/10/2017 3:27 PM

## 2017-03-10 NOTE — Progress Notes (Addendum)
    Subjective  -   Reports no change in his legs   Physical Exam:  Palpable left radial pulse Has sensation in both feet, but can not lift legs off of the bed       Assessment/Plan:    -Lumbar drain to be removed today -Neuro status unchanged. -Unable to complete thrombectomy of proximal left sublalvian artery, suggesting that this was not acute.  Despite left SCA occlusion, patient has a palpable left radial pulse -Will focus on rehab once lumbar drain removed  Victor [name] 03/10/2017 3:26 PM --  Vitals:   03/10/17 1400 03/10/17 1500  BP: 135/90 130/81  Pulse: 68 67  Resp: 13 11  Temp:    SpO2: 100% 98%    Intake/Output Summary (Last 24 hours) at 03/10/2017 1526 Last data filed at 03/10/2017 1500 Gross per 24 hour  Intake 1800 ml  Output 1513 ml  Net 287 ml     Laboratory CBC    Component Value Date/Time   WBC 9.4 03/10/2017 0302   HGB 10.7 (L) 03/10/2017 0302   HGB 10.8 (L) 02/17/2017 0955   HCT 34.0 (L) 03/10/2017 0302   HCT 33.8 (L) 02/17/2017 0955   PLT 192 03/10/2017 0302   PLT 381 (H) 02/17/2017 0955    BMET    Component Value Date/Time   NA 137 03/10/2017 0302   NA 142 02/17/2017 0955   K 3.7 03/10/2017 0302   CL 107 03/10/2017 0302   CO2 24 03/10/2017 0302   GLUCOSE 91 03/10/2017 0302   BUN 17 03/10/2017 0302   BUN 11 02/17/2017 0955   CREATININE 1.09 03/10/2017 0302   CREATININE 1.14 02/03/2015 0936   CALCIUM 7.9 (L) 03/10/2017 0302   GFRNONAA >60 03/10/2017 0302   GFRAA >60 03/10/2017 0302    COAG Lab Results  Component Value Date   INR 1.35 03/08/2017   INR 1.09 01/21/2017   INR 1.16 01/21/2017   No results found for: PTT  Antibiotics Anti-infectives (From admission, onward)   Start     Dose/Rate Route Frequency Ordered Stop   03/08/17 1404  ceFAZolin (ANCEF) 2-4 GM/100ML-% IVPB    Comments:  Lorenda IshiharaGibbs, Bonnie   : cabinet override      03/08/17 1404 03/09/17 0214       V. Charlena CrossWells Oretha Weismann IV, M.D. [facility] [place] Office: [phone] Pager:  [phone]

## 2017-03-10 NOTE — Progress Notes (Signed)
PT Cancellation Note  Patient Details Name: Victor [name] MRN: [id number] DOB: 1955-11-07   Cancelled Treatment:    Reason Eval/Treat Not Completed: Medical issues which prohibited therapy(pt on bedrest)   [name] [name] 03/10/2017, 7:22 AM  [name], [place] [phone]

## 2017-03-11 DIAGNOSIS — S24109S Unspecified injury at unspecified level of thoracic spinal cord, sequela: Secondary | ICD-10-CM

## 2017-03-11 DIAGNOSIS — G8220 Paraplegia, unspecified: Secondary | ICD-10-CM

## 2017-03-11 NOTE — Progress Notes (Signed)
Rehab Admissions Coordinator Note:  Patient was screened by Clois DupesBoyette, Zaeda Mcferran Godwin for appropriateness for an Inpatient Acute Rehab Consult per OT recommendation. At this time, we are recommending Inpatient Rehab consult when felt appropriate.  Clois DupesBoyette, Qasim Diveley Godwin 03/11/2017, 12:09 PM  I can be reached at [phone].

## 2017-03-11 NOTE — Progress Notes (Signed)
[facility] TEAM 1 - Stepdown/ICU TEAM  Karna ChristmasMarcus E Wierzba  WUJ:[id number] DOB: [birth year] DOA: 03/07/2017 PCP: Billee CashingMcKenzie, [place], MD    Brief Narrative:  Patient is a 61 y.o. 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 The night prior to his presentation he noted that his legs were weak and he could not walk (about 2AM). He presented to the ED at 3AM at which time a workup was begun.  Boney spine injury was first ruled out w/ xrays and CT.  A lumbar MRI was also unrevealing.  Neurology was then consulted, and a thoracic MRI was obtained which was c/w a probable spinal cord infarction.  TRH was asked to see the pt for admission, and discussed the case w/ TCTS, who suggested a CTangio as well a a lumbar drain.  CTa revealed luminal thrombus and severe stenosis of the L proximal subclavian.       Significant Events: 11/12 admit - lumbar drain placed by Anesthesia  11/13 to OR - failed attempt at L brachial artery thrombectomy   Subjective: No new complaints. 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  Peripheral arterial disease Complex hx   Large inguinal hernia   DVT prophylaxis: SCDs Code Status: FULL CODE Family Communication:  Disposition Plan: disposition as per Neuro/Vascular/TCTS services   Consultants:  TCTS Vascular Surgery  Neurology   Antimicrobials:  none  Objective: Blood pressure 128/61, pulse (!) 51, temperature (!) 97.5 F (36.4 C), temperature source Oral, resp. rate (!) 22, height 5\' 7"  (1.702 m), weight 70.1 kg  (154 lb 8.7 oz), SpO2 97 %.  Intake/Output Summary (Last 24 hours) at 03/11/2017 1801 Last data filed at 03/11/2017 1100 Gross per 24 hour  Intake 1476.25 ml  Output 730 ml  Net 746.25 ml   Filed Weights   03/08/17 0300 03/09/17 0500 03/11/17 0457  Weight: 71 kg (156 lb 8 oz) 72.1 kg (158 lb 15.2 oz) 70.1 kg (154 lb 8.7 oz)    Examination: No indication for medical exam today   CBC: Recent Labs  Lab 03/07/17 0327 03/08/17 0401 03/10/17 0302  WBC 11.1* 7.2 9.4  NEUTROABS 7.3  --   --   HGB 11.9* 10.1* 10.7*  HCT 40.8 32.3* 34.0*  MCV 87.2 84.3 83.3  PLT 236 210 192   Basic Metabolic Panel: Recent Labs  Lab 03/07/17 0327 03/08/17 0401 03/10/17 0302  NA 139 138 137  K 3.8 4.0 3.7  CL 108 108 107  CO2 19* 24 24  GLUCOSE 117* 117* 91  BUN 16 12 17   CREATININE 1.49* 1.01 1.09  CALCIUM 8.7* 8.3* 7.9*   GFR: Estimated Creatinine Clearance: 67.4 mL/min (by C-G formula based on SCr of 1.09 mg/dL).  Liver Function Tests: Recent Labs  Lab 03/07/17 0327 03/08/17 0401 03/10/17 0302  AST 19 11* 11*  ALT 12* 10* 10*  ALKPHOS 92 76 70  BILITOT 1.1 0.9 0.8  PROT 6.9 5.6* 5.3*  ALBUMIN 3.5 2.9* 2.7*    Coagulation Profile: Recent Labs  Lab 03/08/17 0401  INR 1.35  HbA1C: Hgb A1c MFr Bld  Date/Time Value Ref Range Status  01/19/2017 10:11 AM 5.8 (H) 4.8 - 5.6 % Final    Comment:    (NOTE) Pre diabetes:          5.7%-6.4% Diabetes:              >6.4% Glycemic control for   <7.0% adults with diabetes     CBG: Recent Labs  Lab 03/09/17 1613  GLUCAP 94    Recent Results (from the past 240 hour(s))  Culture, blood (Routine X 2) w Reflex to ID Panel     Status: None (Preliminary result)   Collection Time: 03/07/17  9:35 AM  Result Value Ref Range Status   Specimen Description BLOOD LEFT ANTECUBITAL  Final   Special Requests   Final    BOTTLES DRAWN AEROBIC AND ANAEROBIC Blood Culture adequate volume   Culture NO GROWTH 4 DAYS  Final   Report  Status PENDING  Incomplete  Culture, blood (Routine X 2) w Reflex to ID Panel     Status: None (Preliminary result)   Collection Time: 03/07/17  9:50 AM  Result Value Ref Range Status   Specimen Description BLOOD LEFT HAND  Final   Special Requests   Final    BOTTLES DRAWN AEROBIC AND ANAEROBIC Blood Culture adequate volume   Culture NO GROWTH 4 DAYS  Final   Report Status PENDING  Incomplete     Scheduled Meds: . aspirin EC  81 mg Oral Daily  . docusate sodium  100 mg Oral Daily  . losartan  25 mg Oral Daily  . metoprolol tartrate  12.5 mg Oral BID  . pantoprazole  40 mg Oral Daily     LOS: 4 days   Lonia BloodJeffrey T. McClung, MD [facility] Office  [phone] Pager - Text Page per Amion as per below:  On-Call/Text Page:      [email or web address]      password [email or web address]  If 7PM-7AM, please contact night-coverage [email or web address] Password [email or web address] 03/11/2017, 6:01 PM

## 2017-03-11 NOTE — Progress Notes (Addendum)
[address]       [name] [address]             [phone]        3 Days Post-Op Procedure(s) (LRB): THROMBECTOMY of Left subclavian artery (Left)  Subjective: Patient awake, alert. He is holding his left leg with his hands "stretching". Both LE in boots  Objective: Vital signs in last 24 hours: Temp:  [97.9 F (36.6 C)-98.8 F (37.1 C)] 97.9 F (36.6 C) (11/16 0400) Pulse Rate:  [27-106] 65 (11/16 0600) Cardiac Rhythm: Normal sinus rhythm (11/16 0400) Resp:  [8-22] 14 (11/16 0600) BP: (107-145)/(69-90) 121/76 (11/16 0600) SpO2:  [92 %-100 %] 96 % (11/16 0600) Weight:  [154 lb 8.7 oz (70.1 kg)] 154 lb 8.7 oz (70.1 kg) (11/16 0457)   Current Weight  03/11/17 154 lb 8.7 oz (70.1 kg)      Intake/Output from previous day: 11/15 0701 - 11/16 0700 In: 2026.3 [P.O.:400; I.V.:1626.3] Out: 1793 [Urine:1685; Drains:108]   Physical Exam:  Cardiovascular: RRR Pulmonary: Clear to auscultation bilaterally Extremities: Palpable pulses in feet. Basically, no change in neuro status-able to move toes/feet and has sensation. He is able to plantar flex both feet but NOT dorsiflex (has been this way since hospitalization)   Lab Results: CBC: Recent Labs    03/10/17 0302  WBC 9.4  HGB 10.7*  HCT 34.0*  PLT 192   BMET:  Recent Labs    03/10/17 0302  NA 137  K 3.7  CL 107  CO2 24  GLUCOSE 91  BUN 17  CREATININE 1.09  CALCIUM 7.9*    PT/INR:  Lab Results  Component Value Date   INR 1.35 03/08/2017   INR 1.09 01/21/2017   INR 1.16 01/21/2017   ABG:  INR: Will add last result for INR, ABG once components are confirmed Will add last 4 CBG results once components are confirmed  Assessment/Plan:  1. CV - SR in the 60's and BP well controlled. 2.  Pulmonary - On room air.  3. Thrombosis of left subclavian artery-Unsuccessful attempt at thrombectomy of left subclavian artery yesterday.  4. 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 MRI results of cervical spine: Normal MRI appearance of the cervical spinal cord. No evidence for cord infarction or other abnormality. Multilevel cervical spondylolysis with resultant mild spinal stenosis at C3-4 and C6-7, with moderate right C6 and bilateral C7  foraminal stenosis.MRI results of lumbar spine: Abnormal cord signal intensity within the visualized distal spinal cord to the level of the conus, increased in conspicuity relative to previous thoracic spine MRI, most likely reflecting evolving spinal cord infarct. Mild paraspinous edema as above, most likely related to recent lumbar drain placement. 5. Will eventually need rehab  Lelon Huh [facility] 03/11/2017,7:33 AM   Result Date: 03/11/2017 CLINICAL DATA:  Initial evaluation for acute spinal cord infarction, T10, with lower extremity weakness. EXAM: MRI CERVICAL AND LUMBAR SPINE WITHOUT CONTRAST TECHNIQUE: Multiplanar and multiecho pulse sequences of the cervical spine, to include the craniocervical junction and cervicothoracic junction, and lumbar spine, were obtained without intravenous contrast. COMPARISON:  Prior thoracic and lumbar MRIs from 03/07/2017. FINDINGS: MRI CERVICAL SPINE FINDINGS Alignment: Study degraded by motion artifact. Straightening of the normal cervical lordosis. Trace anterolisthesis of C7 on T1, likely due to chronic facet degeneration. Vertebrae: Vertebral body heights maintained without evidence for acute or chronic fracture. Bone marrow signal intensity within normal limits. No discrete or  worrisome osseous lesion. Mild reactive marrow edema about the right C7-T1 facet due to facet degeneration. Cord: Signal intensity within the cervical spinal cord is normal. No cord signal abnormality to suggest cord infarction or other acute process. Cord caliber within normal limits. Posterior Fossa, vertebral arteries, paraspinal tissues: Chronic microvascular  ischemic changes noted within the pons. Visualized brain and posterior fossa within normal limits. Craniocervical junction normal. Paraspinous soft tissues within normal limits. Normal intravascular flow voids present within the vertebral arteries bilaterally. Scattered nodules noted within the left thyroid lobe, largest of which measures 13 mm, of doubtful significance. Disc levels: C2-C3: Mild facet degeneration. No significant canal or foraminal stenosis. C3-C4: Shallow disc protrusion indents the ventral thecal sac. Resultant mild spinal stenosis. Uncovertebral hypertrophy without significant foraminal encroachment. C4-C5: Mild uncovertebral hypertrophy with facet degeneration. No significant stenosis. C5-C6: Minimal disc bulge with mild bilateral uncovertebral hypertrophy. Moderate facet degeneration, slightly greater on the right. No canal stenosis. Moderate right with mild left C6 foraminal stenosis. C6-C7: Chronic diffuse degenerative disc osteophyte with intervertebral disc space narrowing. Bilateral uncovertebral spurring. Broad posterior component flattens and partially faces the ventral thecal sac. Resultant mild spinal stenosis without significant cord deformity. Moderate bilateral C7 foraminal stenosis, left greater than right. C7-T1: Trace anterolisthesis. Bilateral facet degeneration. No significant canal stenosis. Mild bilateral C8 foraminal stenosis. Visualized upper thoracic spine within normal limits. MRI LUMBAR SPINE FINDINGS Segmentation: Normal segmentation with the lowest well-formed disc labeled the L5-S1 level. Alignment: Trace anterolisthesis of L5 on S1 with trace retrolisthesis of L2 on L3, stable. Vertebral bodies otherwise normally aligned with preservation of the normal lumbar lordosis. Vertebrae: Vertebral body heights maintained. No evidence for acute or interval fracture. Bone marrow signal intensity within normal limits. subcentimeter hemangioma noted within the L5 vertebral body.  No worrisome osseous lesions. Conus medullaris: Extends to the T12-L1 level. Abnormal T2 signal intensity within the visualized cord is again seen, more prevalent as compared to previous MRI (series 12, image 8). Finding likely reflects evolving spinal cord infarct. Possible small amount of subdural fluid related to recent lumbar drain. Paraspinal and other soft tissues: Soft tissue edema within the lower paraspinous soft tissues, likely related to recent lumbar drain. Mild edema present within the left psoas muscle as well (series 14, image 26), which may reflect early denervation changes. 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 Disc levels: L1-2:  Unremarkable. L2-3: Trace retrolisthesis. Diffuse disc bulge with bilateral small subarticular to extraforaminal disc protrusions, stable. Mild facet hypertrophy. No significant spinal stenosis. Stable mild bilateral L2 foraminal narrowing. L3-4: Diffuse disc bulge with disc desiccation. Broad right extraforaminal disc protrusion closely approximating the exiting right L3 nerve root, stable. Facet and ligamentum flavum hypertrophy. No canal stenosis. Mild to moderate right L3 foraminal narrowing. L4-5: Mild facet hypertrophy. Central canal widely patent. Mild bilateral L4 foraminal stenosis, slightly greater on the right. L5-S1: Anterolisthesis. Chronic left-sided pars defect. Severe right-sided facet arthropathy. No significant canal stenosis. Mild to moderate right neural foraminal stenosis. IMPRESSION: MRI CERVICAL SPINE IMPRESSION: 1. Examination had to be partially truncated due to patient's inability to tolerate the full length of the exam. Postcontrast imaging was not performed. 2. Normal MRI appearance of the cervical spinal cord. No evidence for cord infarction or other abnormality. 3. 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 MRI  LUMBAR SPINE IMPRESSION: 1. Abnormal cord signal intensity within the visualized distal spinal cord to the level  of the conus, increased in conspicuity relative to previous thoracic spine MRI, most likely reflecting evolving spinal cord infarct. 2. Mild paraspinous edema as above, most likely related to recent lumbar drain placement. 3. Multilevel degenerative changes, stable as to previously described on recent MRI. Electronically Signed   By: Rise MuBenjamin  McClintock M.D.   On: 03/11/2017 00:59   I have seen and examined Victor ChristmasMarcus E Yoakum and agree with the above assessment  and plan.  Delight OvensEdward B Cher Franzoni MD [facility] [phone] Office [phone] 03/11/2017 3:22 PM

## 2017-03-11 NOTE — Progress Notes (Signed)
   03/11/17 1600  Clinical Encounter Type  Visited With Patient and family together  Consult/Referral To Chaplain  Spiritual Encounters  Spiritual Needs Prayer  Stress Factors  Patient Stress Factors Health changes  Family Stress Factors Health changes  Chaplain went in to visit with the family and the wife of the PT asked me to pray with them.  They end up being members of my church and I was able to share with them many things in common.

## 2017-03-11 NOTE — Evaluation (Signed)
Physical Therapy Evaluation Patient Details Name: Victor [name] MRN: [id number] DOB: 05-31-1955 Today's Date: 03/11/2017   History of Present Illness  61yo admitted with Bil LE weakness with spinal cord infarct T9-L1, 11/12 severe left subclavian thrombus with unsuccessful thrombectomy 11/13, epidural drain 11/12-11/15. 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 Pt provided encouragement and reassurance and was willing to participate. Pt with active movement of RLE and pt able to perform limited HEP with decreased strength and function of LLE. Pt smiled with knowledge of RLE movement. Pt with decreased strength, balance, function, safety and mobility who is also currently limited by sternal precautions. He will benefit from acute therapy to maximize strength, balance, function, transfers to decrease burden of care and maximize quality of life.  BP stable with transition to sitting and SCDs reapplied in chair. Pt educated for pressure relief, progression and transfer back to bed with 2 person pivot or lift.     Follow Up Recommendations CIR;Supervision/Assistance - 24 hour    Equipment Recommendations  Wheelchair (measurements PT);Wheelchair cushion (measurements PT);Hospital bed    Recommendations for Other Services       Precautions / Restrictions Precautions Precautions: Sternal;Fall Precaution Comments: Pt had sternotomy 01/21/17 Restrictions Weight Bearing Restrictions: No      Mobility  Bed Mobility Overal bed mobility: Needs Assistance Bed Mobility: Rolling;Sidelying to Sit Rolling: Max assist Sidelying to sit: Max assist       General bed mobility comments: Mod A to bend of right knee and then Max A to roll to left without use of rail or UEs (due to sternal precautions); Max A for  legs and to bring up trunk to come up into sitting from side lying  Transfers Overall transfer level: Needs assistance Equipment used: (bed pad) Transfers: Squat Pivot Transfers     Squat pivot transfers: +2 physical assistance;Total assist     General transfer comment: with use of bed pad (pt would be able to A with transfers if he did not have sternal precautions. Cues for anterior lean   Ambulation/Gait                Stairs            Wheelchair Mobility    Modified Rankin (Stroke Patients Only)       Balance Overall balance assessment: Needs assistance;History of Falls Sitting-balance support: No upper extremity supported;Feet supported   Sitting balance - Comments: pt performed static and dynamic sitting with assist due to tendency for right posterior LOB                                     Pertinent Vitals/Pain Pain Assessment: No/denies pain    Home Living Family/patient expects to be discharged to:: Private residence Living Arrangements: Parent Available Help at Discharge: Family;Available PRN/intermittently Type of Home: House Home Access: Stairs to enter   [employer] CorporationEntrance Stairs-Number of Steps: 8 Home Layout: Multi-level Home Equipment: None      Prior Function Level of Independence: Independent               Hand Dominance   Dominant Hand: Right    Extremity/Trunk Assessment   Upper Extremity Assessment Upper Extremity Assessment: Defer to OT evaluation  Lower Extremity Assessment Lower Extremity Assessment: RLE deficits/detail;LLE deficits/detail RLE Deficits / Details: 2/5: quads, hamstrings, plantarflexion, hip ABD/Aduct, 1/5 dorsiflexion LLE Deficits / Details: 1/5: plantarflexion, quads, 2/5: glutes- no other active ROM       Communication   Communication: No difficulties  Cognition Arousal/Alertness: Awake/alert Behavior During Therapy: WFL for tasks assessed/performed Overall Cognitive Status:  Within Functional Limits for tasks assessed                                        General Comments      Exercises General Exercises - Lower Extremity Heel Slides: AAROM;Right;5 reps;Supine Hip ABduction/ADduction: AAROM;Right;Supine;5 reps Toe Raises: AAROM;Right;5 reps;Supine   Assessment/Plan    PT Assessment Patient needs continued PT services  PT Problem List Decreased strength;Decreased mobility;Decreased range of motion;Decreased coordination;Decreased activity tolerance;Decreased balance;Decreased knowledge of use of DME;Impaired sensation       PT Treatment Interventions DME instruction;Therapeutic activities;Therapeutic exercise;Patient/family education;Balance training;Wheelchair mobility training;Functional mobility training;Neuromuscular re-education    PT Goals (Current goals can be found in the Care Plan section)  Acute Rehab PT Goals Patient Stated Goal: to be able to walk again PT Goal Formulation: With patient Time For Goal Achievement: 03/25/17 Potential to Achieve Goals: Fair    Frequency Min 4X/week   Barriers to discharge Decreased caregiver support pt lives with parents who will only be able to provide limited physical assist    Co-evaluation PT/OT/SLP Co-Evaluation/Treatment: Yes Reason for Co-Treatment: Complexity of the patient's impairments (multi-system involvement);For patient/therapist safety PT goals addressed during session: Mobility/safety with mobility;Balance;Strengthening/ROM         AM-PAC PT "6 Clicks" Daily Activity  Outcome Measure Difficulty turning over in bed (including adjusting bedclothes, sheets and blankets)?: Unable Difficulty moving from lying on back to sitting on the side of the bed? : Unable Difficulty sitting down on and standing up from a chair with arms (e.g., wheelchair, bedside commode, etc,.)?: Unable Help needed moving to and from a bed to chair (including a wheelchair)?: Total Help needed  walking in hospital room?: Total Help needed climbing 3-5 steps with a railing? : Total 6 Click Score: 6    End of Session Equipment Utilized During Treatment: Gait belt Activity Tolerance: Patient tolerated treatment well Patient left: in chair;with chair alarm set;with call bell/phone within reach Nurse Communication: Mobility status;Need for lift equipment;Precautions PT Visit Diagnosis: Other symptoms and signs involving the nervous system (R29.898);Other abnormalities of gait and mobility (R26.89);Muscle weakness (generalized) (M62.81)    Time: [id number] PT Time Calculation (min) (ACUTE ONLY): 29 min   Charges:   PT Evaluation $PT Eval High Complexity: 1 High     PT G Codes:        Victor [name], PT [phone]   Victor [name] 03/11/2017, 1:09 PM

## 2017-03-11 NOTE — Progress Notes (Signed)
STROKE TEAM PROGRESS NOTE   SUBJECTIVE (INTERVAL HISTORY) No family is at the bedside.  Patient found laying in bed in NAD. Affect depressed today. No facial expression and not as interactive with examiner today. Asking questions about long term ability to walk, etc Overall he feels his condition is unchanged. He does voice that he feel "overwhelmed" with his current condition. Voices no other new complaints. No new events reported overnight. Lumbar epidural drain discontinued. Patient continues to report some improvement in  LE sensation. MRI scan of the cervical spine shows no significant compression only mild disc degenerative changes. MRI scan of the lumbar spine shows cord edema and signal changes consistent with spinal cord infarction. Patient was not cooperative to do postcontrast MRI films OBJECTIVE Recent Labs  Lab 03/09/17 1613  GLUCAP 94   Recent Labs  Lab 03/07/17 0327 03/08/17 0401 03/10/17 0302  NA 139 138 137  K 3.8 4.0 3.7  CL 108 108 107  CO2 19* 24 24  GLUCOSE 117* 117* 91  BUN 16 12 17   CREATININE 1.49* 1.01 1.09  CALCIUM 8.7* 8.3* 7.9*   Recent Labs  Lab 03/07/17 0327 03/08/17 0401 03/10/17 0302  AST 19 11* 11*  ALT 12* 10* 10*  ALKPHOS 92 76 70  BILITOT 1.1 0.9 0.8  PROT 6.9 5.6* 5.3*  ALBUMIN 3.5 2.9* 2.7*   Recent Labs  Lab 03/07/17 0327 03/08/17 0401 03/10/17 0302  WBC 11.1* 7.2 9.4  NEUTROABS 7.3  --   --   HGB 11.9* 10.1* 10.7*  HCT 40.8 32.3* 34.0*  MCV 87.2 84.3 83.3  PLT 236 210 192   No results for input(s): CKTOTAL, CKMB, CKMBINDEX, TROPONINI in the last 168 hours. No results for input(s): LABPROT, INR in the last 72 hours. No results for input(s): COLORURINE, LABSPEC, PHURINE, GLUCOSEU, HGBUR, BILIRUBINUR, KETONESUR, PROTEINUR, UROBILINOGEN, NITRITE, LEUKOCYTESUR in the last 72 hours.  Invalid input(s): APPERANCEUR     Component Value Date/Time   CHOL 121 12/23/2016 0813   TRIG 57 12/23/2016 0813   HDL 43 12/23/2016 0813    CHOLHDL 2.8 12/23/2016 0813   CHOLHDL 2.5 02/03/2015 0936   VLDL 12 02/03/2015 0936   LDLCALC 67 12/23/2016 0813   Lab Results  Component Value Date   HGBA1C 5.8 (H) 01/19/2017   No results found for: LABOPIA, COCAINSCRNUR, LABBENZ, AMPHETMU, THCU, LABBARB  No results for input(s): ETH in the last 168 hours.  IMAGING: I have personally reviewed the radiological images below and agree with the radiology interpretations.  Ct Angio Neck W Or Wo Contrast Result Date: 03/07/2017 IMPRESSION: 1. Interval aortic dissection repair with arch debranching. 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 The other great vessels are widely patent. No embolic disease seen in the neck; no left vertebral stenosis. 2. Chest CTA reported separately. Electronically Signed   By: Marnee SpringJonathon  Watts M.D.   On: 03/07/2017 13:00   Ct Cervical Spine Wo Contrast Result Date: 03/07/2017  IMPRESSION: 1. No definite acute/traumatic cervical spine pathology. Evaluation however is limited due to motion artifact. 2. Nondisplaced fracture of the right first rib at the costovertebral junction, possibly acute. Clinical correlation is recommended. Electronically Signed   By: Elgie CollardArash  Radparvar M.D.   On: 03/07/2017 04:12   Mr Lumbar Spine Wo Contrast Result Date: 03/07/2017 IMPRESSION: 1. Minimal grade 1 L5-S1 anterolisthesis. Chronic RIGHT L5 pars interarticularis defect. No acute osseous process. 2. Degenerative change of the lumbar spine. 3. No canal stenosis. Neural foraminal narrowing  L2-3 through L5-S1: Mild moderate on the RIGHT at L3-4. Electronically Signed   By: Awilda Metro M.D.   On: 03/07/2017 05:13   Mr Thoracic Spine Limited Wo Contrast Result Date: 03/07/2017 IMPRESSION: 1. The examination had to be discontinued prior to completion, but nonetheless the lower thoracic spinal cord signal appears abnormal from the lower T9 level to the conus. The constellation of clinical and  imaging findings were discussed by telephone with Dr. Gwenevere Abbot On 03/07/2017 at 0843 hours, who advised that on exam the patient has at T10 cord sensory level. We discussed that the combination of abrupt onset lower extremity weakness, known abnormal aorta, and central appearing cord signal abnormality are suspicious for spinal cord infarct. 2. No thoracic spinal stenosis and mild for age thoracic spine degeneration except at the cervicothoracic junction where mild anterolisthesis is associated with facet arthropathy. Electronically Signed   By: Odessa Fleming M.D.   On: 03/07/2017 08:57   Ct Angio Chest/abd/pel For Dissection W And/or W/wo Result Date: 03/07/2017  IMPRESSION: Status post surgical repair of ascending thoracic aortic dissection. Interval placement of stent graft extending from proximal transverse aortic arch and through descending thoracic aorta into abdominal aorta. There is nearly complete thrombosis of false lumen since prior exam. Aneurysmal dilatation of descending thoracic aorta is noted with maximum diameter of 6.2 cm. Abdominal aortic dissection is unchanged which extends to infrarenal abdominal aorta. Stable 5 cm proximal abdominal aortic aneurysm is noted. The celiac and right renal arteries arise predominantly from the false lumen. Stable large left inguinal hernia is noted which contains loops of small bowel, but does not result in incarceration or obstruction. 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 Stable emphysematous disease is noted in both upper lobes. Electronically Signed   By: Lupita Raider, M.D.   On: 03/07/2017 13:01   ECHO TEE (OR)    01/21/17  Result status: Final result   Left ventricle: Concentric hypertrophy of moderate severity. LV systolic function is mildly reduced with an EF of 45-50%. There are no obvious wall motion abnormalities.  Septum: No Patent  Foramen Ovale present.  Left atrium: Patent foramen ovale not present.  Aortic valve: The valve is trileaflet. Trace regurgitation.  Aorta: The aortic root is dilated. Aneurysm present in the transverse aorta and descending aorta. DeBakey type III dissection present between the ascending aorta and the descending aorta.  Mitral valve: Trace regurgitation.  Right ventricle: Normal cavity size.  Tricuspid valve: Mild regurgitation.  Aorta: Graft present in the descending aorta and transverse aorta.     REPEAT MRI/MRA - 03/10/17 IMPRESSION: MRI CERVICAL SPINE IMPRESSION: 1. Examination had to be partially truncated due to patient's inability to tolerate the full length of the exam. Postcontrast imaging was not performed. 2. Normal MRI appearance of the cervical spinal cord. No evidence for cord infarction or other abnormality. 3. 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  MRI LUMBAR SPINE IMPRESSION: 1. Abnormal cord signal intensity within the visualized distal spinal cord to the level of the conus, increased in constant acuity relative to previous thoracic spine MRI, most likely reflecting evolving spinal cord infarct. 2. Mild paraspinous edema as above, most likely related to recent lumbar drain placement. 3. Multilevel degenerative changes, stable as to previously described on recent MRI.  PHYSICAL EXAM Temp:  [  97.9 F (36.6 C)-98.8 F (37.1 C)] 98.1 F (36.7 C) (11/16 0800) Pulse Rate:  [27-135] 85 (11/16 1022) Resp:  [8-22] 13 (11/16 0800) BP: (107-145)/(69-90) 132/82 (11/16 1022) SpO2:  [92 %-100 %] 99 % (11/16 0800) Weight:  [70.1 kg (154 lb 8.7 oz)] 70.1 kg (154 lb 8.7 oz) (11/16 0457)  General - Well nourished, well developed, in no apparent distress Respiratory - Lungs clear bilaterally. No wheezing. Cardiovascular - Regular rate and rhythm, +pulses in  feet bilaterally   Neurological Examination Mental Status: Alert and fully oriented.  Speech fluent with intact comprehension and naming. Able to follow or demonstrate accurate comprehension of all motor commands without difficulty. Cranial Nerves: II: Visual fields intact. PERRL.   III,IV, VI: EOMI. No nystagmus.  V,VII: Smile symmetric, facial temp sensation normal bilaterally VIII: hearing intact to conversation IX,X: No hypophonia, nasal speech or hoarseness XI: Symmetric shoulder shrug XII: midline tongue extension Motor: Bilateral upper extremities 5/5 proximal and distal with normal tone.  B/L LE's with mostly flaccid tone at hips.Some weak hip movement noted in Left leg and minimal on right leg. Able to kick out Right leg with support. Moves LE's Right side greater than Left. Able to weakly wiggle toes bilaterally. No clonus with rapid passive dorsiflexion at ankles. No reflex movement to noxious plantar stimulation bilaterally.  Sensory: Proprioception and vibration sensation preserved bilaterally below the level of the umbilicus (T10 sensory level). Some retained sensation to lower extremities, including acute discomfort with noxious plantar stimulation. Sensation above umbilicus is normal.  Deep Tendon Reflexes: 3+ biceps and brachioradialis bilaterally. 0 patellae and achilles bilaterally with and without reinforcement maneuver. Toes are mute bilaterally.  Positive umbilical sign  with umbilicus mowing upwards when patient flexes his neck Cerebellar: No ataxia with FNF bilaterally.  Gait: Unable to assess  ASSESSMENT AND PLAN: Victor [name] is a 61 y.o. 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 His prognosis is guarded. Lumbar drain and steroids have not helped him so far. Recommend complete spinal imaging after he undergoes his subclavian embolectomy. No family available at the bedside for discussion. Discussed with Dr. Sharon SellerMcClung.  03/09/17: Thrombosis of left subclavian artery-Unsuccessful attempt at thrombectomy of left subclavian artery yesterday. Neuro exam slightly improved. Able to move LE's better today and some increased sensation at thighs noted. Persevered vibration/temperature to knees. Proprioception preserved throughout. Will need intensive PT/OT once Lumbar drain discontinued.  03/10/17: Discussed with Dr. Tyrone SageGerhardt and Dr. Sharon SellerMcClung. Discontinue lumbar drain and begin physical occupational therapy.Marland Kitchen. MRI of the spine with some sedation Greater than 50% time during this 25 minute visit  was spent on counseling and coordination of care about a spinal cord infarct and answered questions  03/11/17: Neuro exam stable. MRI stable. No new acute findings/cord compression. Needs CIR and intensive Rehab. Affect depressed today. Will likely need re-imaging in 2 months if symptoms do not significantly improve.   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  with with LE  paresis, S/P Lumbar drain Resultant Symptoms - paralysis of both lower extremities - again some minor improvement in exam today.  MRI - extensive abnormal T2-hyperintense signal w/in central spinal cord T9 -distal tip of the conus medullaris  Follow up MRI Lumbar/Cervical and CT Lumbar - not acute cord compression or lesions. No post contrast studies obtained.  Recommendations: Prognosis continues to be guarded - neuro exam stable Continue to avoid Hypotension Tight Blood sugar control Aggressive PT/OT and Rehab, CIR consulted today Consider starting antidepressant for depressed affect If LE weakness does not improve, should consider repeat Thoracic/Lumbar MRI with contrast  In 2 months   Other Stroke Risk Factors:  Advanced age  LDL - 67, 12/23/16, no need for statin therapy at this time  HgA1C - 5.8, 01/19/17  Other Active Problems:  CAD s/p Card Cath in 2018/ History of PVD s/p Ao FEm Fem bypass graft/ History of Ao dissection s/p repain 12/2016   CKD with possible Acute Kidney Injury   Hospital day # 4 I have personally examined this patient, reviewed notes, independently viewed imaging studies, participated in medical decision making and plan of care.ROS completed by me personally and pertinent positives fully documented  I have made any additions or clarifications directly to the above note. Agree with note above.    Delia Heady, MD Medical Director [facility] Pager: [phone] 03/11/2017 1:46 PM  Neurology to sign off. Please call with any further questions or concerns. Thank you for this consultation.  To contact Stroke Continuity provider, please refer to [email or web address]. After hours, contact General Neurology

## 2017-03-11 NOTE — Consult Note (Signed)
Physical Medicine and Rehabilitation Consult Reason for Consult: Decreased functional mobility Referring Physician: [facility]   HPI: Victor [name] is a 61 y.o. 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 Patient lives with elderly parents reported to be independent prior to admission working as a Paediatric nurse. Presented 03/07/2017 with increasing weakness lower extremities. A lumbar MRI was unrevealing. 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 Underwent thrombectomy of left brachial artery 03/08/2017 per Dr. Edilia Bo as well as placement of lumbar drain. Hospital course complicated by hypotension. Occupational therapy evaluation completed with recommendations of physical medicine rehabilitation consult.   Review of Systems  Constitutional: Negative for chills and fever.  HENT: Negative for hearing loss.   Eyes: Negative for blurred vision and double vision.  Respiratory: Positive for shortness of breath. Negative for cough.   Cardiovascular: Positive for palpitations and leg swelling.  Gastrointestinal: Positive for constipation. Negative for nausea.  Genitourinary: Positive for urgency. Negative for dysuria, flank pain and hematuria.  Musculoskeletal: Positive for joint pain and myalgias.  Neurological:       Bilateral lower extremity weakness  All other systems reviewed and are negative.  Past Medical History:  Diagnosis Date  . History of kidney stones   . Hypertension   . Thoracic aortic aneurysm (HCC) 09/2014   Past Surgical History:  Procedure Laterality Date  . AORTIC ARCH DEBRANCHING N/A 01/21/2017   Performed by [name], MD at [facility] OR  . CARDIAC CATHETERIZATION    . LEFT  FEMORAL ARTERY -RIGHT FEMORAL ARTERY BYPASS GRAFT  USING X 30 CM HEMASHIELD GOLD GRAFT N/A 10/07/2014   Performed by Larina Earthly, MD at [facility] OR  . REDO STERNOTOMY N/A 01/21/2017   Performed by [name], MD at [facility] OR  . Replacement of Aortic Arch with circulatory arrest N/A 01/21/2017   Performed by [name], MD at [facility] OR  . RIGHT/LEFT HEART CATH AND CORONARY ANGIOGRAPHY- Diagnostic Only N/A 01/11/2017   Performed by [name], MD at [facility] INVASIVE CV LAB  . s/p thoracic aneurysm repair    . THORACIC AORTIC ENDOVASCULAR STENT GRAFT N/A 01/21/2017   Performed by Nada Libman, MD at [facility] OR  . THORACIC ASCENDING ANEURYSM REPAIR (AAA) N/A 10/06/2014   Performed by [name], MD at [facility] OR  . THROMBECTOMY of Left subclavian artery Left 03/08/2017   Performed by Chuck Hint, MD at [facility] OR  . TRANSESOPHAGEAL ECHOCARDIOGRAM (TEE) N/A 01/21/2017   Performed by [name], MD at [facility] OR  . VASCULAR SURGERY     Family History  Problem Relation Age of Onset  . Heart murmur Mother    Social History:  reports that he quit smoking about 2 years ago. His smoking use included cigarettes and cigars. He quit after 35.00 years of use. he has never used smokeless tobacco. He reports that he does not drink alcohol or use drugs. Allergies: No Known Allergies Medications Prior to Admission  Medication Sig Dispense Refill  . aspirin 81 MG EC tablet Take 1 tablet (81 mg total) by mouth daily.    Marland Kitchen losartan (COZAAR) 25 MG tablet Take 1 tablet (25 mg total) by mouth daily. 30 tablet 1  . metoprolol tartrate (LOPRESSOR) 25 MG tablet Take 0.5 tablets (  12.5 mg total) by mouth 2 (two) times daily. (Patient taking differently: Take 25 mg 2 (two) times daily by mouth. ) 30 tablet 1  . oxyCODONE 10 MG TABS Take 1 tablet (10 mg total) by mouth every 6 (six) hours as needed for severe pain. 30 tablet 0  . amiodarone (PACERONE) 200 MG tablet Please take 2 tabs twice a day for 2 days and then take one tab twice a day until we see  you in the office. 70 tablet 1  . chlorproMAZINE (THORAZINE) 10 MG tablet Take 1 tablet (10 mg total) by mouth every 8 (eight) hours as needed (hiccups). (Patient not taking: Reported on 03/07/2017) 20 tablet 0    Home: Home Living Family/patient expects to be discharged to:: Private residence Living Arrangements: Parent Available Help at Discharge: Family, Available PRN/intermittently Type of Home: House Home Access: Stairs to enter Secretary/administratorntrance Stairs-Number of Steps: 8 Home Layout: Multi-level Alternate Level Stairs-Number of Steps: Split level Bathroom Shower/Tub: Tub/shower unit Home Equipment: None  Functional History: Prior Function Level of Independence: Independent Functional Status:  Mobility: Bed Mobility Overal bed mobility: Needs Assistance Bed Mobility: Rolling, Sidelying to Sit Rolling: Max assist Sidelying to sit: Max assist General bed mobility comments: Mod A to bend of right knee and then Max A to roll to left without use of rail or UEs (due to sternal precautions); Max A for legs and to bring up trunk to come up into sitting from side lying Transfers Overall transfer level: Needs assistance Equipment used: (bed pad) Transfers: Squat Pivot Transfers Squat pivot transfers: +2 physical assistance, Total assist General transfer comment: with use of bed pad (pt would be able to A with transfers if he did not have sternal precautions      ADL: ADL Overall ADL's : Needs assistance/impaired Eating/Feeding: Independent Eating/Feeding Details (indicate cue type and reason): supported sitting (pt cannot maintain sitting unsupported) Grooming: Minimal assistance Grooming Details (indicate cue type and reason): supported sitting (pt cannot maintain sitting unsupported) Upper Body Bathing: Minimal assistance Upper Body Bathing Details (indicate cue type and reason): supported sitting (pt cannot maintain sitting unsupported) Lower Body Bathing: Maximal assistance, Bed  level Upper Body Dressing : Maximal assistance Upper Body Dressing Details (indicate cue type and reason): supported sitting (pt cannot maintain sitting unsupported) Lower Body Dressing: Total assistance, Bed level Toilet Transfer: Total assistance, +2 for physical assistance Toilet Transfer Details (indicate cue type and reason): squat pivot with use of bed pad Toileting- Clothing Manipulation and Hygiene: Total assistance, Bed level  Cognition: Cognition Overall Cognitive Status: Within Functional Limits for tasks assessed Orientation Level: (P) Oriented X4 Cognition Arousal/Alertness: Awake/alert Behavior During Therapy: WFL for tasks assessed/performed Overall Cognitive Status: Within Functional Limits for tasks assessed  Blood pressure 132/82, pulse 85, temperature 98.1 F (36.7 C), temperature source Oral, resp. rate 13, height 5\' 7"  (1.702 m), weight 70.1 kg (154 lb 8.7 oz), SpO2 99 %. Physical Exam  Constitutional: He appears well-developed.  HENT:  Head: Normocephalic.  Eyes: EOM are normal.  Neck: Normal range of motion. Neck supple. No thyromegaly present.  Cardiovascular: Normal rate, regular rhythm and normal heart sounds.  Respiratory: Effort normal and breath sounds normal. No respiratory distress.  GI: Soft. Bowel sounds are normal. He exhibits no distension.  Neurological:  Lethargic but arousable. He would answer basic questions and provide name agent date of birth. Follow simple commands. UE 5/5 bilaterally.  Left lower extremity 1 out of 5 hip flexion knee extension and ankle dorsiflexion/plantarflexion.  Right lower extremity is 1+ to 2 out of 5 hip knee and ankle.  Sensation is decreased in both legs to light touch.  He can sense general touch and position sense to a degree.  Psychiatric:  Affect flat but cooperative    No results found for this or any previous visit (from the past 24 hour(s)). Mr Cervical Spine Wo Contrast  Result Date: 03/11/2017 CLINICAL  DATA:  Initial evaluation for acute spinal cord infarction, T10, with lower extremity weakness. EXAM: MRI CERVICAL AND LUMBAR SPINE WITHOUT CONTRAST TECHNIQUE: Multiplanar and multiecho pulse sequences of the cervical spine, to include the craniocervical junction and cervicothoracic junction, and lumbar spine, were obtained without intravenous contrast. COMPARISON:  Prior thoracic and lumbar MRIs from 03/07/2017. FINDINGS: MRI CERVICAL SPINE FINDINGS Alignment: Study degraded by motion artifact. Straightening of the normal cervical lordosis. Trace anterolisthesis of C7 on T1, likely due to chronic facet degeneration. Vertebrae: Vertebral body heights maintained without evidence for acute or chronic fracture. Bone marrow signal intensity within normal limits. No discrete or worrisome osseous lesion. Mild reactive marrow edema about the right C7-T1 facet due to facet degeneration. Cord: Signal intensity within the cervical spinal cord is normal. No cord signal abnormality to suggest cord infarction or other acute process. Cord caliber within normal limits. Posterior Fossa, vertebral arteries, paraspinal tissues: Chronic microvascular ischemic changes noted within the pons. Visualized brain and posterior fossa within normal limits. Craniocervical junction normal. Paraspinous soft tissues within normal limits. Normal intravascular flow voids present within the vertebral arteries bilaterally. Scattered nodules noted within the left thyroid lobe, largest of which measures 13 mm, of doubtful significance. Disc levels: C2-C3: Mild facet degeneration. No significant canal or foraminal stenosis. C3-C4: Shallow central disc protrusion indents the ventral thecal sac. Resultant mild spinal stenosis. Uncovertebral hypertrophy without significant foraminal encroachment. C4-C5: Mild uncovertebral hypertrophy with facet degeneration. No significant stenosis. C5-C6: Minimal disc bulge with mild bilateral uncovertebral hypertrophy.  Moderate facet degeneration, slightly greater on the right. No canal stenosis. Moderate right with mild left C6 foraminal stenosis. C6-C7: Chronic diffuse degenerative disc osteophyte with intervertebral disc space narrowing. Bilateral uncovertebral spurring. Broad posterior component flattens and partially effaces the ventral thecal sac. Resultant mild spinal stenosis without significant cord deformity. Moderate bilateral C7 foraminal stenosis, left greater than right. C7-T1: Trace anterolisthesis. Bilateral facet degeneration. No significant canal stenosis. Mild bilateral C8 foraminal stenosis. Visualized upper thoracic spine within normal limits. MRI LUMBAR SPINE FINDINGS Segmentation: Normal segmentation with the lowest well-formed disc labeled the L5-S1 level. Alignment: Trace anterolisthesis of L5 on S1, with trace retrolisthesis of L2 on L3, stable. Vertebral bodies otherwise normally aligned with preservation of the normal lumbar lordosis. Vertebrae: Vertebral body heights maintained. No evidence for acute or interval fracture. Bone marrow signal intensity within normal limits. Subcentimeter hemangioma noted within the L5 vertebral body. No worrisome osseous lesions. Conus medullaris: Extends to the T12-L1 level. Abnormal T2 signal intensity seen within the visualized MRI is (series 12, image 8). Finding likely reflects evolving spinal cord infarct. Possible small amount of subdural fluid related to recent lumbar drain. Paraspinal and other soft tissues: Soft tissue edema within the lower paraspinous soft tissues, like related to recent lumbar drain. Mild edema present with posterior left psoas muscle as well (Series 14, image 26), which may reflect early denervation changes. Complex infrarenal aortic aneurysm with dissection grossly stable from previous exams. Complex right renal cyst with internal fluid fluid level also grossly similar. Disc levels: L1-2:  Unremarkable. L2-3: Trace retrolisthesis. Diffuse  disc bulge with bilateral small subarticular 2 extraforaminal disc protrusions, stable. Mild facet hypertrophy. No significant spinal stenosis. Stable mild bilateral L2 foraminal narrowing. L3-4: Diffuse disc bulge with disc desiccation. Broad right extraforaminal disc protrusion closely approximating the exiting right L3 nerve root, stable. Facet and ligamentum flavum hypertrophy. No canal stenosis. Mild to moderate right L3 foraminal narrowing. L4-5: Mild facet hypertrophy. Central canal widely patent. Mild bilateral L4 foraminal stenosis, slightly greater on the right. L5-S1: Anterolisthesis. Chronic left-sided pars defect. Severe right-sided facet arthropathy. No significant canal stenosis. Mild to moderate right neural foraminal stenosis. IMPRESSION: MRI CERVICAL SPINE IMPRESSION: 1. Examination had to be partially truncated due to patient's inability to tolerate the full length of the exam. Postcontrast imaging was not performed. 2. Normal MRI appearance of the cervical spinal cord. No evidence for cord infarction or other abnormality. 3. 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 MRI LUMBAR SPINE IMPRESSION: 1. Abnormal cord signal intensity within the visualized distal spinal cord to the level of the conus, increased in constant acuity relative to previous thoracic spine MRI, most likely reflecting evolving spinal cord infarct. 2. Mild paraspinous edema as above, most likely related to recent lumbar drain placement. 3. Multilevel degenerative changes, stable as to previously described on recent MRI. Electronically Signed   By: Rise Mu M.D.   On: 03/11/2017 00:44   Mr Lumbar Spine Wo Contrast  Result Date: 03/11/2017 CLINICAL DATA:  Initial evaluation for acute spinal cord infarction, T10, with lower extremity weakness. EXAM: MRI CERVICAL AND LUMBAR SPINE WITHOUT CONTRAST TECHNIQUE:  Multiplanar and multiecho pulse sequences of the cervical spine, to include the craniocervical junction and cervicothoracic junction, and lumbar spine, were obtained without intravenous contrast. COMPARISON:  Prior thoracic and lumbar MRIs from 03/07/2017. FINDINGS: MRI CERVICAL SPINE FINDINGS Alignment: Study degraded by motion artifact. Straightening of the normal cervical lordosis. Trace anterolisthesis of C7 on T1, likely due to chronic facet degeneration. Vertebrae: Vertebral body heights maintained without evidence for acute or chronic fracture. Bone marrow signal intensity within normal limits. No discrete or worrisome osseous lesion. Mild reactive marrow edema about the right C7-T1 facet due to facet degeneration. Cord: Signal intensity within the cervical spinal cord is normal. No cord signal abnormality to suggest cord infarction or other acute process. Cord caliber within normal limits. Posterior Fossa, vertebral arteries, paraspinal tissues: Chronic microvascular ischemic changes noted within the pons. Visualized brain and posterior fossa within normal limits. Craniocervical junction normal. Paraspinous soft tissues within normal limits. Normal intravascular flow voids present within the vertebral arteries bilaterally. Scattered nodules noted within the left thyroid lobe, largest of which measures 13 mm, of doubtful significance. Disc levels: C2-C3: Mild facet degeneration. No significant canal or foraminal stenosis. C3-C4: Shallow disc protrusion indents the ventral thecal sac. Resultant mild spinal stenosis. Uncovertebral hypertrophy without significant foraminal encroachment. C4-C5: Mild uncovertebral hypertrophy with facet degeneration. No significant stenosis. C5-C6: Minimal disc bulge with mild bilateral uncovertebral hypertrophy. Moderate facet degeneration, slightly greater on the right. No canal stenosis. Moderate right with mild left C6 foraminal stenosis. C6-C7: Chronic diffuse degenerative  disc osteophyte with intervertebral disc space narrowing. Bilateral uncovertebral spurring. Broad posterior component flattens and partially faces the ventral thecal sac. Resultant mild spinal stenosis without significant cord deformity. Moderate bilateral C7 foraminal stenosis, left greater than right. C7-T1: Trace anterolisthesis. Bilateral facet degeneration. No significant canal stenosis. Mild bilateral C8 foraminal stenosis. Visualized upper thoracic spine within normal  limits. MRI LUMBAR SPINE FINDINGS Segmentation: Normal segmentation with the lowest well-formed disc labeled the L5-S1 level. Alignment: Trace anterolisthesis of L5 on S1 with trace retrolisthesis of L2 on L3, stable. Vertebral bodies otherwise normally aligned with preservation of the normal lumbar lordosis. Vertebrae: Vertebral body heights maintained. No evidence for acute or interval fracture. Bone marrow signal intensity within normal limits. subcentimeter hemangioma noted within the L5 vertebral body. No worrisome osseous lesions. Conus medullaris: Extends to the T12-L1 level. Abnormal T2 signal intensity within the visualized cord is again seen, more prevalent as compared to previous MRI (series 12, image 8). Finding likely reflects evolving spinal cord infarct. Possible small amount of subdural fluid related to recent lumbar drain. Paraspinal and other soft tissues: Soft tissue edema within the lower paraspinous soft tissues, likely related to recent lumbar drain. Mild edema present within the left psoas muscle as well (series 14, image 26), which may reflect early denervation changes. 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 Disc levels: L1-2:  Unremarkable. L2-3: Trace retrolisthesis. Diffuse disc bulge with bilateral small subarticular to extraforaminal disc protrusions, stable. Mild facet hypertrophy. No significant spinal stenosis. Stable mild bilateral L2 foraminal narrowing. L3-4: Diffuse disc bulge with  disc desiccation. Broad right extraforaminal disc protrusion closely approximating the exiting right L3 nerve root, stable. Facet and ligamentum flavum hypertrophy. No canal stenosis. Mild to moderate right L3 foraminal narrowing. L4-5: Mild facet hypertrophy. Central canal widely patent. Mild bilateral L4 foraminal stenosis, slightly greater on the right. L5-S1: Anterolisthesis. Chronic left-sided pars defect. Severe right-sided facet arthropathy. No significant canal stenosis. Mild to moderate right neural foraminal stenosis. IMPRESSION: MRI CERVICAL SPINE IMPRESSION: 1. Examination had to be partially truncated due to patient's inability to tolerate the full length of the exam. Postcontrast imaging was not performed. 2. Normal MRI appearance of the cervical spinal cord. No evidence for cord infarction or other abnormality. 3. 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 MRI LUMBAR SPINE IMPRESSION: 1. Abnormal cord signal intensity within the visualized distal spinal cord to the level of the conus, increased in conspicuity relative to previous thoracic spine MRI, most likely reflecting evolving spinal cord infarct. 2. Mild paraspinous edema as above, most likely related to recent lumbar drain placement. 3. Multilevel degenerative changes, stable as to previously described on recent MRI. Electronically Signed   By: Rise MuBenjamin  McClintock M.D.   On: 03/11/2017 00:59    Assessment/Plan: Diagnosis: Thoracic spinal cord infarct with paraplegia 1. Does the need for close, 24 hr/day medical supervision in concert with the patient's rehab needs make it unreasonable for this patient to be served in a less intensive setting? Yes 2. Co-Morbidities requiring supervision/potential complications: Pain management, neurogenic bowel and bladder, hypertension 3. Due to bladder management, bowel management, safety, skin/wound  care, disease management, medication administration, pain management and patient education, does the patient require 24 hr/day rehab nursing? Yes 4. Does the patient require coordinated care of a physician, rehab nurse, PT (1-2 hrs/day, 5 days/week) and OT (1-2 hrs/day, 5 days/week) to address physical and functional deficits in the context of the above medical diagnosis(es)? Yes Addressing deficits in the following areas: balance, endurance, locomotion, strength, transferring, bowel/bladder control, bathing, dressing, feeding, grooming, toileting and psychosocial support 5. Can the patient actively participate in an intensive therapy program of at least 3 hrs of therapy per day at least 5 days per week? Yes 6. The potential for patient to  make measurable gains while on inpatient rehab is excellent 7. Anticipated functional outcomes upon discharge from inpatient rehab are supervision and min assist  with PT, supervision and min assist with OT, n/a with SLP. 8. Estimated rehab length of stay to reach the above functional goals is: 20-25 days 9. Anticipated D/C setting: Home 10. Anticipated post D/C treatments: HH therapy 11. Overall Rehab/Functional Prognosis: excellent  RECOMMENDATIONS: This patient's condition is appropriate for continued rehabilitative care in the following setting: CIR Patient has agreed to participate in recommended program. Yes Note that insurance prior authorization may be required for reimbursement for recommended care.  Comment: Rehab Admissions Coordinator to follow up.  Thanks,  Ranelle Oyster, MD, [place] [facility]    Mcarthur Rossetti Angiulli, PA-C 03/11/2017

## 2017-03-11 NOTE — Evaluation (Signed)
Occupational Therapy Evaluation Patient Details Name: Victor [name] MRN: [id number] DOB: 1955/08/06 Today's Date: 03/11/2017    History of Present Illness 61yo admitted with Bil LE weakness with spinal cord infarct T9-L1, 11/12 severe left subclavian thrombus with unsuccessful thrombectomy 11/13, epidural drain 11/12-11/15. PMH: 9/28 TAVR/redo sternotomy with left subclavian BPG,  thoracic aneurysm repair, left right fem-fem BPG, CAD, HTN, CKD   Clinical Impression   This 61 yo admitted with above presents to acute OT with decreased movement Bil LEs (R>L) and decreased balance--both greatly affecting his PLOF of being totally independent. He will benefit from acute OT with follow up OT on CIR to get to a S level or better from W/C level.  Follow Up Recommendations  CIR;Supervision/Assistance - 24 hour    Equipment Recommendations  Other (comment)(TBD at next venue)       Precautions / Restrictions Precautions Precautions: Sternal;Fall Precaution Comments: Pt had sternotomy 01/21/17 Restrictions Weight Bearing Restrictions: No      Mobility Bed Mobility Overal bed mobility: Needs Assistance Bed Mobility: Rolling;Sidelying to Sit Rolling: Max assist Sidelying to sit: Max assist       General bed mobility comments: Mod A to bend of right knee and then Max A to roll to left without use of rail or UEs (due to sternal precautions); Max A for legs and to bring up trunk to come up into sitting from side lying  Transfers Overall transfer level: Needs assistance Equipment used: (bed pad) Transfers: Squat Pivot Transfers     Squat pivot transfers: +2 physical assistance;Total assist     General transfer comment: with use of bed pad (pt would be able to A with transfers if he did not have sternal precautions    Balance Overall balance assessment: Needs assistance;History of Falls Sitting-balance support: No upper extremity supported;Feet supported   Sitting balance -  Comments: Pt can maintain static sitting with hands in lap; however once dynamic component thrown in (ie: reaching he loses his balance with greatest tendency to right and posteriorly). Worked sitting EOB reaching and leaning with trunk left, right, and posteriorly with then coming back to midline                                   ADL either performed or assessed with clinical judgement   ADL Overall ADL's : Needs assistance/impaired Eating/Feeding: Independent Eating/Feeding Details (indicate cue type and reason): supported sitting (pt cannot maintain sitting unsupported) Grooming: Minimal assistance Grooming Details (indicate cue type and reason): supported sitting (pt cannot maintain sitting unsupported) Upper Body Bathing: Minimal assistance Upper Body Bathing Details (indicate cue type and reason): supported sitting (pt cannot maintain sitting unsupported) Lower Body Bathing: Maximal assistance;Bed level   Upper Body Dressing : Maximal assistance Upper Body Dressing Details (indicate cue type and reason): supported sitting (pt cannot maintain sitting unsupported) Lower Body Dressing: Total assistance;Bed level   Toilet Transfer: Total assistance;+2 for physical assistance Toilet Transfer Details (indicate cue type and reason): squat pivot with use of bed pad Toileting- Clothing Manipulation and Hygiene: Total assistance;Bed level               Vision Baseline Vision/History: No visual deficits              Pertinent Vitals/Pain Pain Assessment: No/denies pain     Hand Dominance Right   Extremity/Trunk Assessment Upper Extremity Assessment Upper Extremity Assessment: Overall WFL for tasks assessed  Lower Extremity Assessment Lower Extremity Assessment: Defer to PT evaluation       Communication Communication Communication: No difficulties   Cognition Arousal/Alertness: Awake/alert Behavior During Therapy: WFL for tasks  assessed/performed Overall Cognitive Status: Within Functional Limits for tasks assessed                                                Home Living Family/patient expects to be discharged to:: Private residence Living Arrangements: Parent Available Help at Discharge: Family;Available PRN/intermittently Type of Home: House Home Access: Stairs to enter Enter[employer] CorporationEntrance Stairs-Number of Steps: 8   Home Layout: Multi-level Alternate Level Stairs-Number of Steps: Split level   Bathroom Shower/Tub: Tub/shower unit         Home Equipment: None          Prior Functioning/Environment Level of Independence: Independent                 OT Problem List: Decreased strength;Decreased range of motion;Impaired balance (sitting and/or standing);Decreased knowledge of use of DME or AE      OT Treatment/Interventions: Self-care/ADL training;Balance training;Therapeutic activities;DME and/or AE instruction;Patient/family education    OT Goals(Current goals can be found in the care plan section) Acute Rehab OT Goals Patient Stated Goal: to be able to walk again OT Goal Formulation: With patient Time For Goal Achievement: 03/25/17 Potential to Achieve Goals: Good  OT Frequency: Min 3X/week              AM-PAC PT "6 Clicks" Daily Activity     Outcome Measure Help from another person eating meals?: None Help from another person taking care of personal grooming?: A Little Help from another person toileting, which includes using toliet, bedpan, or urinal?: Total Help from another person bathing (including washing, rinsing, drying)?: A Lot Help from another person to put on and taking off regular upper body clothing?: A Lot Help from another person to put on and taking off regular lower body clothing?: Total 6 Click Score: 13   End of Session Nurse Communication: Mobility status;Need for lift equipment(maxi sky)  Activity Tolerance: Patient tolerated treatment  well Patient left: in chair;with call bell/phone within reach;with chair alarm set  OT Visit Diagnosis: Other abnormalities of gait and mobility (R26.89);Muscle weakness (generalized) (M62.81);History of falling (Z91.81);Other (comment)(paraplegia)                Time: [id number] OT Time Calculation (min): 29 min Charges:  OT General Charges $OT Visit: 1 Visit OT Evaluation $OT Eval High Complexity: [address] High Ignacia PalmaCathy Jaeli Grubb, OTR/L [phone] 03/11/2017

## 2017-03-12 LAB — CBC
HEMATOCRIT: 35.6 % — AB (ref 39.0–52.0)
HEMOGLOBIN: 11.1 g/dL — AB (ref 13.0–17.0)
MCH: 26.1 pg (ref 26.0–34.0)
MCHC: 31.2 g/dL (ref 30.0–36.0)
MCV: 83.8 fL (ref 78.0–100.0)
Platelets: 251 10*3/uL (ref 150–400)
RBC: 4.25 MIL/uL (ref 4.22–5.81)
RDW: 15.2 % (ref 11.5–15.5)
WBC: 7.2 10*3/uL (ref 4.0–10.5)

## 2017-03-12 LAB — CULTURE, BLOOD (ROUTINE X 2)
CULTURE: NO GROWTH
CULTURE: NO GROWTH
SPECIAL REQUESTS: ADEQUATE
Special Requests: ADEQUATE

## 2017-03-12 LAB — PROTIME-INR
INR: 1.28
Prothrombin Time: 15.9 seconds — ABNORMAL HIGH (ref 11.4–15.2)

## 2017-03-12 LAB — APTT: APTT: 29 s (ref 24–36)

## 2017-03-12 LAB — MRSA PCR SCREENING: MRSA by PCR: NEGATIVE

## 2017-03-12 NOTE — Progress Notes (Signed)
[facility] TEAM 1 - Stepdown/ICU TEAM  Victor [name]  NWG:[id number] DOB: 1955-12-04 DOA: 03/07/2017 PCP: Billee CashingMcKenzie, [place], MD    Brief Narrative:  Patient is a 61 y.o. 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 The night prior to his presentation he noted that his legs were weak and he could not walk (about 2AM). He presented to the ED at 3AM at which time a workup was begun.  Boney spine injury was first ruled out w/ xrays and CT.  A lumbar MRI was also unrevealing.  Neurology was then consulted, and a thoracic MRI was obtained which was c/w a probable spinal cord infarction.  TRH was asked to see the pt for admission, and discussed the case w/ TCTS, who suggested a CTangio as well a a lumbar drain.  CTa revealed luminal thrombus and severe stenosis of the L proximal subclavian.       Significant Events: 11/12 admit - lumbar drain placed by Anesthesia  11/13 to OR - failed attempt at L brachial artery thrombectomy   Subjective: No new complaints. Awaiting disposition. Seen alongside patient's Mom and Nurse. Will transfer patient out of the ICU. 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  Peripheral arterial disease Complex hx   Large inguinal hernia   DVT prophylaxis: SCDs Code Status: FULL CODE Family Communication:  Disposition Plan: disposition as per Neuro/Vascular/TCTS services   Consultants:  TCTS Vascular Surgery  Neurology   Antimicrobials:  none  Objective: Blood pressure 126/64, pulse (!) 29, temperature 98.3 F (36.8 C),  temperature source Axillary, resp. rate 10, height 5\' 7"  (1.702 m), weight 70.1 kg (154 lb 8.7 oz), SpO2 98 %.  Intake/Output Summary (Last 24 hours) at 03/12/2017 1331 Last data filed at 03/12/2017 1000 Gross per 24 hour  Intake 604 ml  Output 350 ml  Net 254 ml   Filed Weights   03/08/17 0300 03/09/17 0500 03/11/17 0457  Weight: 71 kg (156 lb 8 oz) 72.1 kg (158 lb 15.2 oz) 70.1 kg (154 lb 8.7 oz)    Examination: No indication for medical exam today   CBC: Recent Labs  Lab 03/07/17 0327 03/08/17 0401 03/10/17 0302  WBC 11.1* 7.2 9.4  NEUTROABS 7.3  --   --   HGB 11.9* 10.1* 10.7*  HCT 40.8 32.3* 34.0*  MCV 87.2 84.3 83.3  PLT 236 210 192   Basic Metabolic Panel: Recent Labs  Lab 03/07/17 0327 03/08/17 0401 03/10/17 0302  NA 139 138 137  K 3.8 4.0 3.7  CL 108 108 107  CO2 19* 24 24  GLUCOSE 117* 117* 91  BUN 16 12 17   CREATININE 1.49* 1.01 1.09  CALCIUM 8.7* 8.3* 7.9*   GFR: Estimated Creatinine Clearance: 67.4 mL/min (by C-G formula based on SCr of 1.09 mg/dL).  Liver Function Tests: Recent Labs  Lab 03/07/17 0327 03/08/17 0401 03/10/17 0302  AST 19 11* 11*  ALT 12* 10* 10*  ALKPHOS 92 76 70  BILITOT 1.1 0.9 0.8  PROT 6.9 5.6* 5.3*  ALBUMIN 3.5 2.9* 2.7*    Coagulation Profile:  Recent Labs  Lab 03/08/17 0401  INR 1.35    HbA1C: Hgb A1c MFr Bld  Date/Time Value Ref Range Status  01/19/2017 10:11 AM 5.8 (H) 4.8 - 5.6 % Final    Comment:    (NOTE) Pre diabetes:          5.7%-6.4% Diabetes:              >6.4% Glycemic control for   <7.0% adults with diabetes     CBG: Recent Labs  Lab 03/09/17 1613  GLUCAP 94    Recent Results (from the past 240 hour(s))  Culture, blood (Routine X 2) w Reflex to ID Panel     Status: None (Preliminary result)   Collection Time: 03/07/17  9:35 AM  Result Value Ref Range Status   Specimen Description BLOOD LEFT ANTECUBITAL  Final   Special Requests   Final    BOTTLES DRAWN AEROBIC AND ANAEROBIC  Blood Culture adequate volume   Culture NO GROWTH 4 DAYS  Final   Report Status PENDING  Incomplete  Culture, blood (Routine X 2) w Reflex to ID Panel     Status: None (Preliminary result)   Collection Time: 03/07/17  9:50 AM  Result Value Ref Range Status   Specimen Description BLOOD LEFT HAND  Final   Special Requests   Final    BOTTLES DRAWN AEROBIC AND ANAEROBIC Blood Culture adequate volume   Culture NO GROWTH 4 DAYS  Final   Report Status PENDING  Incomplete  MRSA PCR Screening     Status: None   Collection Time: 03/12/17  3:26 AM  Result Value Ref Range Status   MRSA by PCR NEGATIVE NEGATIVE Final    Comment:        The GeneXpert MRSA Assay (FDA approved for NASAL specimens only), is one component of a comprehensive MRSA colonization surveillance program. It is not intended to diagnose MRSA infection nor to guide or monitor treatment for MRSA infections.      Scheduled Meds: . aspirin EC  81 mg Oral Daily  . docusate sodium  100 mg Oral Daily  . losartan  25 mg Oral Daily  . metoprolol tartrate  12.5 mg Oral BID  . pantoprazole  40 mg Oral Daily     LOS: 5 days   Lonia BloodJeffrey T. McClung, MD [facility] Office  [phone] Pager - Text Page per Amion as per below:  On-Call/Text Page:      [email or web address]      password [email or web address]  If 7PM-7AM, please contact night-coverage [email or web address] Password [email or web address] 03/12/2017, 1:31 PM

## 2017-03-12 NOTE — Progress Notes (Signed)
Pt transferred to 4np2. [facility]. Assessment stable. All belongings including cell phone and charger sent with pt.

## 2017-03-12 NOTE — Progress Notes (Signed)
Patient's blood pressure 99/68@ 2204, holding Lopressor, paged MD. Patient's only complaint is of pain, which readjusting him in bed gave him some relief. Asymptomatic as far as BP is concerned. Continuing to monitor.

## 2017-03-13 NOTE — Progress Notes (Signed)
Pt did well tonight. Depressed affect at beginning of shift, asked nurse to perform PROM for his legs throughout the night, which helped with the pain in his legs. Seemed to perk up after his bath, can twitch LLE and slightly bend RLE. No problems with Bilateral upper extremities. Moved him from 4NP02 to 4NP01 because there is a skylift in 4NP01. Requested and ate snacks and a TV dinner, didn't sleep much. Continuing to monitor patient.

## 2017-03-13 NOTE — Plan of Care (Signed)
  Progressing Education: Knowledge of General Education information will improve 03/13/2017 0645 - Progressing by Neldon NewportFenske, Shanquita Ronning P, RN Health Behavior/Discharge Planning: Ability to manage health-related needs will improve 03/13/2017 0645 - Progressing by Neldon NewportFenske, Zamiyah Resendes P, RN Clinical Measurements: Ability to maintain clinical measurements within normal limits will improve 03/13/2017 0645 - Progressing by Neldon NewportFenske, Azam Gervasi P, RN Will remain free from infection 03/13/2017 0645 - Progressing by Neldon NewportFenske, Natallia Stellmach P, RN Diagnostic test results will improve 03/13/2017 0645 - Progressing by Neldon NewportFenske, Yardley Lekas P, RN Respiratory complications will improve 03/13/2017 0645 - Progressing by Neldon NewportFenske, Britney Captain P, RN Cardiovascular complication will be avoided 03/13/2017 0645 - Progressing by Neldon NewportFenske, Kayon Dozier P, RN Activity: Risk for activity intolerance will decrease 03/13/2017 0645 - Progressing by Neldon NewportFenske, Theressa Piedra P, RN Nutrition: Adequate nutrition will be maintained 03/13/2017 0645 - Progressing by Neldon NewportFenske, Bradi Arbuthnot P, RN Coping: Level of anxiety will decrease 03/13/2017 0645 - Progressing by Neldon NewportFenske, Jearld Hemp P, RN Elimination: Will not experience complications related to bowel motility 03/13/2017 0645 - Progressing by Neldon NewportFenske, Brayley Mackowiak P, RN Will not experience complications related to urinary retention 03/13/2017 0645 - Progressing by Neldon NewportFenske, Zaryia Markel P, RN Pain Managment: General experience of comfort will improve 03/13/2017 0645 - Progressing by Neldon NewportFenske, Sephira Zellman P, RN Safety: Ability to remain free from injury will improve 03/13/2017 0645 - Progressing by Neldon NewportFenske, Marina Desire P, RN Skin Integrity: Risk for impaired skin integrity will decrease 03/13/2017 0645 - Progressing by Neldon NewportFenske, Annel Zunker P, RN

## 2017-03-13 NOTE — Progress Notes (Signed)
[facility] TEAM 1 - Stepdown/ICU TEAM  Victor [name]  YQI:[id number] DOB: 11/30/1955 DOA: 03/07/2017 PCP: Billee CashingMcKenzie, [place], MD    Brief Narrative:  Patient is a 61 y.o. 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 The night prior to his presentation he noted that his legs were weak and he could not walk (about 2AM). He presented to the ED at 3AM at which time a workup was begun.  Boney spine injury was first ruled out w/ xrays and CT.  A lumbar MRI was also unrevealing.  Neurology was then consulted, and a thoracic MRI was obtained which was c/w a probable spinal cord infarction.  TRH was asked to see the pt for admission, and discussed the case w/ TCTS, who suggested a CTangio as well a a lumbar drain.  CTa revealed luminal thrombus and severe stenosis of the L proximal subclavian.       Significant Events: 11/12 admit - lumbar drain placed by Anesthesia  11/13 to OR - failed attempt at L brachial artery thrombectomy   Subjective: No new complaints. Awaiting disposition.   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  Peripheral arterial disease Complex hx   Large inguinal hernia   DVT prophylaxis: SCDs Code Status: FULL CODE Family Communication:  Disposition Plan: disposition as per Neuro/Vascular/TCTS services   Consultants:  TCTS Vascular Surgery  Neurology   Antimicrobials:  none  Objective: Blood pressure 104/64, pulse 72, temperature 98 F (36.7 C), temperature source Oral, resp. rate 11, height 5\' 7"  (1.702 m), weight 78.6 kg (173 lb  3.2 oz), SpO2 99 %.  Intake/Output Summary (Last 24 hours) at 03/13/2017 0949 Last data filed at 03/12/2017 2035 Gross per 24 hour  Intake -  Output 700 ml  Net -700 ml   Filed Weights   03/09/17 0500 03/11/17 0457 03/12/17 1603  Weight: 72.1 kg (158 lb 15.2 oz) 70.1 kg (154 lb 8.7 oz) 78.6 kg (173 lb 3.2 oz)    Examination: No indication for medical exam today   CBC: Recent Labs  Lab 03/07/17 0327 03/08/17 0401 03/10/17 0302 03/12/17 1840  WBC 11.1* 7.2 9.4 7.2  NEUTROABS 7.3  --   --   --   HGB 11.9* 10.1* 10.7* 11.1*  HCT 40.8 32.3* 34.0* 35.6*  MCV 87.2 84.3 83.3 83.8  PLT 236 210 192 251   Basic Metabolic Panel: Recent Labs  Lab 03/07/17 0327 03/08/17 0401 03/10/17 0302  NA 139 138 137  K 3.8 4.0 3.7  CL 108 108 107  CO2 19* 24 24  GLUCOSE 117* 117* 91  BUN 16 12 17   CREATININE 1.49* 1.01 1.09  CALCIUM 8.7* 8.3* 7.9*   GFR: Estimated Creatinine Clearance: 67.4 mL/min (by C-G formula based on SCr of 1.09 mg/dL).  Liver Function Tests: Recent Labs  Lab 03/07/17 0327 03/08/17 0401 03/10/17 0302  AST 19 11* 11*  ALT 12* 10* 10*  ALKPHOS 92 76 70  BILITOT 1.1 0.9 0.8  PROT 6.9 5.6* 5.3*  ALBUMIN 3.5 2.9* 2.7*    Coagulation Profile: Recent Labs  Lab 03/08/17 0401 03/12/17 1840  INR 1.35 1.28    HbA1C: Hgb A1c MFr Bld  Date/Time Value Ref Range Status  01/19/2017 10:11 AM 5.8 (H) 4.8 - 5.6 % Final    Comment:    (NOTE) Pre diabetes:          5.7%-6.4% Diabetes:              >6.4% Glycemic control for   <7.0% adults with diabetes     CBG: Recent Labs  Lab 03/09/17 1613  GLUCAP 94    Recent Results (from the past 240 hour(s))  Culture, blood (Routine X 2) w Reflex to ID Panel     Status: None   Collection Time: 03/07/17  9:35 AM  Result Value Ref Range Status   Specimen Description BLOOD LEFT ANTECUBITAL  Final   Special Requests   Final    BOTTLES DRAWN AEROBIC AND ANAEROBIC Blood Culture adequate volume   Culture NO  GROWTH 5 DAYS  Final   Report Status 03/12/2017 FINAL  Final  Culture, blood (Routine X 2) w Reflex to ID Panel     Status: None   Collection Time: 03/07/17  9:50 AM  Result Value Ref Range Status   Specimen Description BLOOD LEFT HAND  Final   Special Requests   Final    BOTTLES DRAWN AEROBIC AND ANAEROBIC Blood Culture adequate volume   Culture NO GROWTH 5 DAYS  Final   Report Status 03/12/2017 FINAL  Final  MRSA PCR Screening     Status: None   Collection Time: 03/12/17  3:26 AM  Result Value Ref Range Status   MRSA by PCR NEGATIVE NEGATIVE Final    Comment:        The GeneXpert MRSA Assay (FDA approved for NASAL specimens only), is one component of a comprehensive MRSA colonization surveillance program. It is not intended to diagnose MRSA infection nor to guide or monitor treatment for MRSA infections.      Scheduled Meds: . aspirin EC  81 mg Oral Daily  . docusate sodium  100 mg Oral Daily  . losartan  25 mg Oral Daily  . metoprolol tartrate  12.5 mg Oral BID  . pantoprazole  40 mg Oral Daily     LOS: 6 days   Lonia BloodJeffrey T. McClung, MD [facility] Office  [phone] Pager - Text Page per Amion as per below:  On-Call/Text Page:      [email or web address]      password [email or web address]  If 7PM-7AM, please contact night-coverage [email or web address] Password [email or web address] 03/13/2017, 9:49 AM

## 2017-03-13 NOTE — Progress Notes (Signed)
Pt has been in SR w/ PAC's and PVC's during the day and is now going in and out of afib/aflutter at 90's-105. BP 125/67. Pt asymptomatic. Dr Dartha Lodgegbata paged.

## 2017-03-13 NOTE — Progress Notes (Signed)
  Progress Note   Date: 03/13/2017  Patient Name: Victor [name]        MRN#: [id number]  Possibly related   [name], MD

## 2017-03-14 NOTE — Progress Notes (Signed)
PROGRESS NOTE    Victor [name]  ZOX:[id number] DOB: 1955-06-26 DOA: 03/07/2017 PCP: Billee Cashing, MD     Brief Narrative:  Victor [name] is a 61 y.o.African American malewith a history of aortic arch replacement with bypass to his innominate, left cca and left subclavian arteries followed by TEVAR for dissection with aneurysm on 01/21/17. The night prior to his presentation, he noted that his legs were weak and he could not walk (about 2AM). He presented to the ED at 3AM at which time a workup was begun.  Boney spine injury was first ruled out w/ xrays and CT.  A lumbar MRI was also unrevealing.  Neurology was then consulted, and thoracic MRI was obtained which was c/w a probable spinal cord infarction.  TRH was asked to see the pt for admission, and discussed the case w/ TCTS, who suggested a CTangio as well a a lumbar drain.  CTa revealed luminal thrombus and severe stenosis of the L proximal subclavian. He ultimately underwent lumbar drain placement on 11/12, and a failed attempt at left brachial artery thrombectomy on 11/13. He worked with PT OT and was recommended for CIR.   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 Continue lopressor, cozaar   Hypothermia -Felt to be neurogenic  CAD PVD  Aortic dissection type 1 s/p repair 01/21/2017  -Hx as above -Aspirin    DVT prophylaxis: SCD Code Status: Full Family  Communication: No family at bedside Disposition Plan: CIR eval   Consultants:   Cardiothoracic surgery  Anesthesiology   Vascular surgery  Neurology   Procedures:   S/p epidural catheter placed 11/12 by anesthesiology, removed 11/15   S/p thrombectomy left subclavian artery, unsuccessful despite multiple attempts by vascular surgery    Antimicrobials:  Anti-infectives (From admission, onward)   Start     Dose/Rate Route Frequency Ordered Stop   03/08/17 1404  ceFAZolin (ANCEF) 2-4 GM/100ML-% IVPB    Comments:  Lorenda Ishihara   : cabinet override      03/08/17 1404 03/09/17 0214       Subjective: No new events. Feeling well   Objective: Vitals:   03/14/17 0000 03/14/17 0408 03/14/17 0600 03/14/17 0800  BP: (!) 132/92   130/77  Pulse: 79   88  Resp: 13   16  Temp: 99 F (37.2 C) 98.3 F (36.8 C)  98.5 F (36.9 C)  TempSrc: Oral Oral  Oral  SpO2: 100%   100%  Weight:   77.5 kg (170 lb 13.7 oz) 76.5 kg (168 lb 10.4 oz)  Height:        Intake/Output Summary (Last 24 hours) at 03/14/2017 1110 Last data filed at 03/14/2017 0500 Gross per 24 hour  Intake 720 ml  Output 1200 ml  Net -480 ml   Filed Weights   03/12/17 1603 03/14/17 0600 03/14/17 0800  Weight: 78.6 kg (173 lb 3.2 oz) 77.5 kg (170 lb 13.7 oz) 76.5 kg (168 lb 10.4 oz)  Examination:  General exam: Appears calm and comfortable  Respiratory system: Clear to auscultation. Respiratory effort normal. Cardiovascular system: S1 & S2 heard, RRR. No JVD, murmurs, rubs, gallops or clicks. No pedal edema. Gastrointestinal system: Abdomen is nondistended, soft and nontender. No organomegaly or masses felt. Normal bowel sounds heard. Central nervous system: Alert and oriented. Speech clear  Extremities: Symmetric in appearance. RLE diminished in strength, LLE paresis  Skin: No rashes, lesions or ulcers Psychiatry: Judgement and insight appear normal. Mood & affect appropriate.   Data Reviewed: I have  personally reviewed following labs and imaging studies  CBC: Recent Labs  Lab 03/08/17 0401 03/10/17 0302 03/12/17 1840  WBC 7.2 9.4 7.2  HGB 10.1* 10.7* 11.1*  HCT 32.3* 34.0* 35.6*  MCV 84.3 83.3 83.8  PLT 210 192 251   Basic Metabolic Panel: Recent Labs  Lab 03/08/17 0401 03/10/17 0302  NA 138 137  K 4.0 3.7  CL 108 107  CO2 24 24  GLUCOSE 117* 91  BUN 12 17  CREATININE 1.01 1.09  CALCIUM 8.3* 7.9*   GFR: Estimated Creatinine Clearance: 67.4 mL/min (by C-G formula based on SCr of 1.09 mg/dL). Liver Function Tests: Recent Labs  Lab 03/08/17 0401 03/10/17 0302  AST 11* 11*  ALT 10* 10*  ALKPHOS 76 70  BILITOT 0.9 0.8  PROT 5.6* 5.3*  ALBUMIN 2.9* 2.7*   No results for input(s): LIPASE, AMYLASE in the last 168 hours. No results for input(s): AMMONIA in the last 168 hours. Coagulation Profile: Recent Labs  Lab 03/08/17 0401 03/12/17 1840  INR 1.35 1.28   Cardiac Enzymes: No results for input(s): CKTOTAL, CKMB, CKMBINDEX, TROPONINI in the last 168 hours. BNP (last 3 results) No results for input(s): PROBNP in the last 8760 hours. HbA1C: No results for input(s): HGBA1C in the last 72 hours. CBG: Recent Labs  Lab 03/09/17 1613  GLUCAP 94   Lipid Profile: No results for input(s): CHOL, HDL, LDLCALC, TRIG, CHOLHDL, LDLDIRECT in the last 72 hours. Thyroid Function Tests: No results for input(s): TSH, T4TOTAL, FREET4, T3FREE, THYROIDAB in the last 72 hours. Anemia Panel: No results for input(s): VITAMINB12, FOLATE, FERRITIN, TIBC, IRON, RETICCTPCT in the last 72 hours. Sepsis Labs: Recent Labs  Lab 03/07/17 1634  LATICACIDVEN 2.6*    Recent Results (from the past 240 hour(s))  Culture, blood (Routine X 2) w Reflex to ID Panel     Status: None   Collection Time: 03/07/17  9:35 AM  Result Value Ref Range Status   Specimen Description BLOOD LEFT ANTECUBITAL  Final   Special Requests   Final    BOTTLES DRAWN AEROBIC AND ANAEROBIC Blood Culture  adequate volume   Culture NO GROWTH 5 DAYS  Final   Report Status 03/12/2017 FINAL  Final  Culture, blood (Routine X 2) w Reflex to ID Panel     Status: None   Collection Time: 03/07/17  9:50 AM  Result Value Ref Range Status   Specimen Description BLOOD LEFT HAND  Final   Special Requests   Final    BOTTLES DRAWN AEROBIC AND ANAEROBIC Blood Culture adequate volume   Culture NO GROWTH 5 DAYS  Final   Report Status 03/12/2017 FINAL  Final  MRSA PCR Screening     Status: None   Collection Time: 03/12/17  3:26 AM  Result Value Ref Range Status   MRSA by PCR NEGATIVE NEGATIVE Final    Comment:        The GeneXpert MRSA Assay (FDA approved  for NASAL specimens only), is one component of a comprehensive MRSA colonization surveillance program. It is not intended to diagnose MRSA infection nor to guide or monitor treatment for MRSA infections.        Radiology Studies: No results found.    Scheduled Meds: . aspirin EC  81 mg Oral Daily  . docusate sodium  100 mg Oral Daily  . losartan  25 mg Oral Daily  . metoprolol tartrate  12.5 mg Oral BID  . pantoprazole  40 mg Oral Daily   Continuous Infusions: . sodium chloride       LOS: 7 days    Time spent: 30 minutes   Noralee StainJennifer Adnan Vanvoorhis, DO [facility] [email or web address] Password [email or web address] 03/14/2017, 11:10 AM

## 2017-03-14 NOTE — Progress Notes (Signed)
Rehab admissions - I attempted to see patient this am, but he declined my visit.  I will see patient again tomorrow.  Call me for questions.  #[phone]

## 2017-03-14 NOTE — Progress Notes (Signed)
Physical Therapy Treatment Patient Details Name: Victor ChristmasMarcus E [name] MRN: [id number] DOB: 09/15/1955 Today's Date: 03/14/2017    History of Present Illness 61yo admitted with Bil LE weakness with spinal cord infarct T9-L1, 11/12 severe left subclavian thrombus with unsuccessful thrombectomy 11/13, epidural drain 11/12-11/15. PMH: 9/28 TAVR/redo sternotomy with left subclavian BPG,  thoracic aneurysm repair, left right fem-fem BPG, CAD, HTN, CKD    PT Comments    Pt pleasant and eager to move his legs and advance mobility. Pt educated for lateral scoot transfers and performed x 3 with pt still limited by balance deficits with difficulty maintaining anterior translation. Pt with greatly improved functional mobility with clearance by TCTS for bil UE use. Pt educated for HEP as he did not recall education from Friday. Pt able to demonstrate armrest pushups and educated for  <2hrs in chair with frequent pressure relief. Bil PRAFO applied end of session along with SCDs.    Follow Up Recommendations  CIR;Supervision/Assistance - 24 hour     Equipment Recommendations  Wheelchair (measurements PT);Wheelchair cushion (measurements PT);Hospital bed    Recommendations for Other Services       Precautions / Restrictions Precautions Precautions: Fall Precaution Comments: pt no longer requires sternal precautions per TCTS    Mobility  Bed Mobility Overal bed mobility: Needs Assistance Bed Mobility: Supine to Sit     Supine to sit: Mod assist;HOB elevated     General bed mobility comments: HOB 30 degrees with use of rail and assist to move LLE to EOB, sequential cues and mod assist to fully pivot pelvis and extend trunk. Cues for bil UE support and positioning for sitting balance  Transfers Overall transfer level: Needs assistance              Lateral/Scoot Transfers: Mod assist General transfer comment: pt with drop arm recliner and pt with cues for sequence, hand position and anterior  trunk translation. Pt able to move and position bil LE and pivot bed >recliner>bed>recliner for 3 trials with cues and assist for anterior translation, blocking left knee and control of trunk and pelvis  Ambulation/Gait                 Stairs            Wheelchair Mobility    Modified Rankin (Stroke Patients Only)       Balance Overall balance assessment: Needs assistance   Sitting balance-Leahy Scale: Poor Sitting balance - Comments: pt able to perform static sitting with single UE assist. With dynamic movement of transfer pt with posterior LOB x 2 with assist and cues to recover                                    Cognition Arousal/Alertness: Awake/alert Behavior During Therapy: WFL for tasks assessed/performed Overall Cognitive Status: Within Functional Limits for tasks assessed                                        Exercises General Exercises - Lower Extremity Long Arc Quad: AAROM;PROM;Right;Left;10 reps;Seated(PROM LLE with trace activation) Hip ABduction/ADduction: AAROM;PROM;Right;Left;10 reps;Seated Hip Flexion/Marching: AAROM;PROM;Right;Left;10 reps;Seated(PROM LLE with trace activation)    General Comments        Pertinent Vitals/Pain Pain Assessment: No/denies pain    Home Living  Prior Function            PT Goals (current goals can now be found in the care plan section) Progress towards PT goals: Progressing toward goals    Frequency           PT Plan Current plan remains appropriate    Co-evaluation              AM-PAC PT "6 Clicks" Daily Activity  Outcome Measure  Difficulty turning over in bed (including adjusting bedclothes, sheets and blankets)?: Unable Difficulty moving from lying on back to sitting on the side of the bed? : Unable Difficulty sitting down on and standing up from a chair with arms (e.g., wheelchair, bedside commode, etc,.)?: Unable Help  needed moving to and from a bed to chair (including a wheelchair)?: Total Help needed walking in hospital room?: Total Help needed climbing 3-5 steps with a railing? : Total 6 Click Score: 6    End of Session   Activity Tolerance: Patient tolerated treatment well Patient left: in chair;with chair alarm set;with call bell/phone within reach Nurse Communication: Mobility status;Precautions;Other (comment)(sequence for lateral scoot, no longer than 2 hrs in chair , and pressure relief for pt) PT Visit Diagnosis: Other symptoms and signs involving the nervous system (R29.898);Other abnormalities of gait and mobility (R26.89);Muscle weakness (generalized) (M62.81)     Time: [id number] PT Time Calculation (min) (ACUTE ONLY): 30 min  Charges:  $Therapeutic Exercise: 8-22 mins $Therapeutic Activity: 8-22 mins                    G Codes:       Delaney MeigsMaija Tabor Bambie Pizzolato, PT [phone]   Damichael Hofman B Lakisha Peyser 03/14/2017, 10:04 AM

## 2017-03-14 NOTE — Care Management Note (Signed)
Case Management Note  Patient Details  Name: Victor [name] MRN: [id number] Date of Birth: 06/25/1955  Subjective/Objective:      61yo admitted with Bil LE weakness with spinal cord infarct T9-L1, 11/12 severe left subclavian thrombus with unsuccessful thrombectomy 11/13, epidural drain 11/12-11/15.  PTA, pt independent, lives with family member.                Action/Plan: PT/OT recommending CIR; hopeful for rehab admission upon medical stability.    Expected Discharge Date:  (Pending)               Expected Discharge Plan:  IP Rehab Facility  In-House Referral:     Discharge planning Services  CM Consult  Post Acute Care Choice:    Choice offered to:     DME Arranged:    DME Agency:     HH Arranged:    HH Agency:     Status of Service:  In process, will continue to follow  If discussed at Long Length of Stay Meetings, dates discussed:    Additional Comments:  Quintella BatonJulie W. Herberth Deharo, RN, BSN  Trauma/Neuro ICU Case Manager [phone]

## 2017-03-15 ENCOUNTER — Inpatient Hospital Stay (HOSPITAL_COMMUNITY)
Admission: RE | Admit: 2017-03-15 | Discharge: 2017-04-06 | DRG: 052 | Disposition: A | Discharge status: Home Health | Payer: Medicaid Other | Source: Intra-hospital | Attending: Physical Medicine & Rehabilitation | Admitting: Physical Medicine & Rehabilitation

## 2017-03-15 ENCOUNTER — Encounter (HOSPITAL_COMMUNITY): Payer: Self-pay | Admitting: *Deleted

## 2017-03-15 ENCOUNTER — Other Ambulatory Visit: Payer: Self-pay

## 2017-03-15 DIAGNOSIS — D638 Anemia in other chronic diseases classified elsewhere: Secondary | ICD-10-CM | POA: Diagnosis present

## 2017-03-15 DIAGNOSIS — N5089 Other specified disorders of the male genital organs: Secondary | ICD-10-CM | POA: Diagnosis present

## 2017-03-15 DIAGNOSIS — Z87891 Personal history of nicotine dependence: Secondary | ICD-10-CM | POA: Diagnosis not present

## 2017-03-15 DIAGNOSIS — G9511 Acute infarction of spinal cord (embolic) (nonembolic): Secondary | ICD-10-CM

## 2017-03-15 DIAGNOSIS — Z95828 Presence of other vascular implants and grafts: Secondary | ICD-10-CM

## 2017-03-15 DIAGNOSIS — K4090 Unilateral inguinal hernia, without obstruction or gangrene, not specified as recurrent: Secondary | ICD-10-CM | POA: Diagnosis present

## 2017-03-15 DIAGNOSIS — G8220 Paraplegia, unspecified: Secondary | ICD-10-CM | POA: Diagnosis not present

## 2017-03-15 DIAGNOSIS — R29898 Other symptoms and signs involving the musculoskeletal system: Secondary | ICD-10-CM | POA: Diagnosis not present

## 2017-03-15 DIAGNOSIS — Z7982 Long term (current) use of aspirin: Secondary | ICD-10-CM | POA: Diagnosis not present

## 2017-03-15 DIAGNOSIS — I10 Essential (primary) hypertension: Secondary | ICD-10-CM | POA: Diagnosis present

## 2017-03-15 DIAGNOSIS — N390 Urinary tract infection, site not specified: Secondary | ICD-10-CM | POA: Diagnosis not present

## 2017-03-15 DIAGNOSIS — N319 Neuromuscular dysfunction of bladder, unspecified: Secondary | ICD-10-CM | POA: Diagnosis present

## 2017-03-15 DIAGNOSIS — Z0279 Encounter for issue of other medical certificate: Secondary | ICD-10-CM | POA: Diagnosis not present

## 2017-03-15 DIAGNOSIS — K592 Neurogenic bowel, not elsewhere classified: Secondary | ICD-10-CM | POA: Diagnosis present

## 2017-03-15 DIAGNOSIS — Z79899 Other long term (current) drug therapy: Secondary | ICD-10-CM | POA: Diagnosis not present

## 2017-03-15 DIAGNOSIS — Z9889 Other specified postprocedural states: Secondary | ICD-10-CM | POA: Diagnosis not present

## 2017-03-15 MED ORDER — SORBITOL 70 % SOLN
30.0000 mL | Freq: Every day | Status: DC | PRN
  Administered 2017-03-15 – 2017-04-03 (×8): 30 mL via ORAL
  Filled 2017-03-15 (×9): qty 30

## 2017-03-15 MED ORDER — ONDANSETRON HCL 4 MG/2ML IJ SOLN: 4.0000 mg | Freq: Four times a day (QID) | INTRAMUSCULAR | Status: DC | PRN

## 2017-03-15 MED ORDER — LOSARTAN POTASSIUM 50 MG PO TABS
25.0000 mg | ORAL_TABLET | Freq: Every day | ORAL | Status: DC
  Administered 2017-03-17: 25 mg via ORAL
  Filled 2017-03-15 (×3): qty 1

## 2017-03-15 MED ORDER — ASPIRIN EC 81 MG PO TBEC
81.0000 mg | DELAYED_RELEASE_TABLET | Freq: Every day | ORAL | Status: DC
  Administered 2017-03-16 – 2017-04-06 (×22): 81 mg via ORAL
  Filled 2017-03-15 (×22): qty 1

## 2017-03-15 MED ORDER — ACETAMINOPHEN 650 MG RE SUPP: 650.0000 mg | Freq: Four times a day (QID) | RECTAL | Status: DC | PRN

## 2017-03-15 MED ORDER — DOCUSATE SODIUM 100 MG PO CAPS
100.0000 mg | ORAL_CAPSULE | Freq: Every day | ORAL | Status: DC
  Filled 2017-03-15: qty 1

## 2017-03-15 MED ORDER — METOPROLOL TARTRATE 12.5 MG HALF TABLET
12.5000 mg | ORAL_TABLET | Freq: Two times a day (BID) | ORAL | Status: DC
  Administered 2017-03-15 – 2017-04-06 (×27): 12.5 mg via ORAL
  Filled 2017-03-15 (×45): qty 1

## 2017-03-15 MED ORDER — PANTOPRAZOLE SODIUM 40 MG PO TBEC
40.0000 mg | DELAYED_RELEASE_TABLET | Freq: Every day | ORAL | Status: DC
  Administered 2017-03-16 – 2017-04-06 (×22): 40 mg via ORAL
  Filled 2017-03-15 (×22): qty 1

## 2017-03-15 MED ORDER — SENNOSIDES-DOCUSATE SODIUM 8.6-50 MG PO TABS
1.0000 | ORAL_TABLET | Freq: Every evening | ORAL | Status: DC | PRN
  Administered 2017-03-15: 1 via ORAL
  Filled 2017-03-15: qty 1

## 2017-03-15 MED ORDER — HYDROCODONE-ACETAMINOPHEN 5-325 MG PO TABS
1.0000 | ORAL_TABLET | ORAL | Status: DC | PRN
Start: 2017-03-15 — End: 2017-04-06
  Administered 2017-03-15 – 2017-03-17 (×6): 2 via ORAL
  Administered 2017-03-17: 1 via ORAL
  Administered 2017-03-17 – 2017-03-23 (×19): 2 via ORAL
  Administered 2017-03-23: 1 via ORAL
  Administered 2017-03-24 – 2017-03-26 (×4): 2 via ORAL
  Administered 2017-03-26: 1 via ORAL
  Administered 2017-03-26 – 2017-03-28 (×5): 2 via ORAL
  Administered 2017-03-28: 1 via ORAL
  Administered 2017-03-29: 2 via ORAL
  Administered 2017-03-29: 1 via ORAL
  Administered 2017-03-29 – 2017-04-05 (×15): 2 via ORAL
  Filled 2017-03-15 (×29): qty 2
  Filled 2017-03-15: qty 1
  Filled 2017-03-15 (×8): qty 2
  Filled 2017-03-15: qty 1
  Filled 2017-03-15 (×9): qty 2
  Filled 2017-03-15: qty 1
  Filled 2017-03-15 (×8): qty 2
  Filled 2017-03-15: qty 1
  Filled 2017-03-15: qty 2

## 2017-03-15 MED ORDER — ALUM & MAG HYDROXIDE-SIMETH 200-200-20 MG/5ML PO SUSP: 15.0000 mL | ORAL | Status: DC | PRN

## 2017-03-15 MED ORDER — ONDANSETRON HCL 4 MG PO TABS: 4.0000 mg | ORAL_TABLET | Freq: Four times a day (QID) | ORAL | Status: DC | PRN

## 2017-03-15 MED ORDER — ACETAMINOPHEN 325 MG PO TABS
650.0000 mg | ORAL_TABLET | Freq: Four times a day (QID) | ORAL | Status: DC | PRN
  Administered 2017-03-17 – 2017-04-05 (×3): 650 mg via ORAL
  Filled 2017-03-15 (×5): qty 2

## 2017-03-15 NOTE — Consult Note (Signed)
Physical Medicine and Rehabilitation Consult Reason for Consult: Decreased functional mobility Referring Physician: [facility]   HPI: Victor [name] is a 61 y.o. 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 Patient lives with elderly parents reported to be independent prior to admission working as a Paediatric nursebarber. Presented 03/07/2017 with increasing weakness lower extremities. A lumbar MRI was unrevealing. 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 Underwent thrombectomy of left brachial artery 03/08/2017 per Dr. Edilia Boickson as well as placement of lumbar drain. Hospital course complicated by hypotension. Occupational therapy evaluation completed with recommendations of physical medicine rehabilitation consult.   Review of Systems  Constitutional: Negative for chills and fever.  HENT: Negative for hearing loss.   Eyes: Negative for blurred vision and double vision.  Respiratory: Positive for shortness of breath. Negative for cough.   Cardiovascular: Positive for palpitations and leg swelling.  Gastrointestinal: Positive for constipation. Negative for nausea.  Genitourinary: Positive for urgency. Negative for dysuria, flank pain and hematuria.  Musculoskeletal: Positive for joint pain and myalgias.  Neurological:       Bilateral lower extremity weakness  All other systems reviewed and are negative.  Past Medical History:  Diagnosis Date  . History of kidney stones   . Hypertension   . Thoracic aortic aneurysm (HCC) 09/2014   Past Surgical History:  Procedure Laterality Date  . ASCENDING AORTIC ROOT REPLACEMENT N/A 01/21/2017   Procedure: Replacement of Aortic Arch with circulatory arrest;  Surgeon: Delight OvensGerhardt, Edward B, MD;  [facility];  Service:  Open Heart Surgery;  Laterality: N/A;  . CARDIAC CATHETERIZATION    . FEMORAL-FEMORAL BYPASS GRAFT N/A 10/07/2014   Procedure: LEFT  FEMORAL ARTERY -RIGHT FEMORAL ARTERY BYPASS GRAFT USING 8MM X 30 CM HEMASHIELD GOLD GRAFT;  Surgeon: Larina Earthlyodd F Early, MD;  [facility];  Service: Vascular;  Laterality: N/A;  . RIGHT/LEFT HEART CATH AND CORONARY ANGIOGRAPHY N/A 01/11/2017   Procedure: RIGHT/LEFT HEART CATH AND CORONARY ANGIOGRAPHY- Diagnostic Only;  Surgeon: Tonny Bollmanooper, Michael, MD;  [facility];  Service: Cardiovascular;  Laterality: N/A;  . s/p thoracic aneurysm repair    . STERNOTOMY N/A 01/21/2017   Procedure: REDO STERNOTOMY;  Surgeon: Delight OvensGerhardt, Edward B, MD;  [facility];  Service: Open Heart Surgery;  Laterality: N/A;  . TEE WITHOUT CARDIOVERSION N/A 01/21/2017   Procedure: TRANSESOPHAGEAL ECHOCARDIOGRAM (TEE);  Surgeon: Delight OvensGerhardt, Edward B, MD;  [facility];  Service: Open Heart Surgery;  Laterality: N/A;  . THORACIC AORTIC ANEURYSM REPAIR N/A 10/06/2014   Procedure: THORACIC ASCENDING ANEURYSM REPAIR (AAA);  Surgeon: Delight OvensEdward B Gerhardt, MD;  [facility];  Service: Open Heart Surgery;  Laterality: N/A;  hyportermia circulatory arrest and resuspension of aortic valve  . THORACIC AORTIC ENDOVASCULAR STENT GRAFT N/A 01/21/2017   Procedure: THORACIC AORTIC ENDOVASCULAR STENT GRAFT;  Surgeon: Nada LibmanBrabham, Vance W, MD;  [facility];  Service: Vascular;  Laterality: N/A;  . THROMBECTOMY ILIAC ARTERY Left 03/08/2017   Procedure: THROMBECTOMY of Left subclavian artery;  Surgeon: Chuck Hintickson, Christopher S, MD;  [facility];  Service: Vascular;  Laterality: Left;  Marland Kitchen. VASCULAR SURGERY     Family History  Problem Relation Age of Onset  . Heart murmur Mother    Social History:  reports that he quit smoking about 2 years ago.  His smoking use included cigarettes and cigars. He quit after 35.00 years of use. he has never used smokeless tobacco. He reports that he does not drink alcohol  or use drugs. Allergies: No Known Allergies Medications Prior to Admission  Medication Sig Dispense Refill  . aspirin 81 MG EC tablet Take 1 tablet (81 mg total) by mouth daily.    Marland Kitchen losartan (COZAAR) 25 MG tablet Take 1 tablet (25 mg total) by mouth daily. 30 tablet 1  . metoprolol tartrate (LOPRESSOR) 25 MG tablet Take 0.5 tablets (12.5 mg total) by mouth 2 (two) times daily. (Patient taking differently: Take 25 mg 2 (two) times daily by mouth. ) 30 tablet 1  . oxyCODONE 10 MG TABS Take 1 tablet (10 mg total) by mouth every 6 (six) hours as needed for severe pain. 30 tablet 0  . amiodarone (PACERONE) 200 MG tablet Please take 2 tabs twice a day for 2 days and then take one tab twice a day until we see you in the office. 70 tablet 1  . chlorproMAZINE (THORAZINE) 10 MG tablet Take 1 tablet (10 mg total) by mouth every 8 (eight) hours as needed (hiccups). (Patient not taking: Reported on 03/07/2017) 20 tablet 0    Home:    Functional History:   Functional Status:  Mobility:          ADL:    Cognition:      There were no vitals taken for this visit. Physical Exam  Constitutional: He appears well-developed.  HENT:  Head: Normocephalic.  Eyes: EOM are normal.  Neck: Normal range of motion. Neck supple. No thyromegaly present.  Cardiovascular: Normal rate, regular rhythm and normal heart sounds.  Respiratory: Effort normal and breath sounds normal. No respiratory distress.  GI: Soft. Bowel sounds are normal. He exhibits no distension.  Neurological:  Lethargic but arousable. He would answer basic questions and provide name agent date of birth. Follow simple commands. UE 5/5 bilaterally.  Left lower extremity 1 out of 5 hip flexion knee extension and ankle dorsiflexion/plantarflexion.  Right lower extremity is 1+ to 2 out of 5 hip knee and ankle.  Sensation is decreased in both legs to light touch.  He can sense general touch and position sense to a degree.  Psychiatric:  Affect  flat but cooperative    No results found for this or any previous visit (from the past 24 hour(s)). No results found.  Assessment/Plan: Diagnosis: Thoracic spinal cord infarct with paraplegia 1. Does the need for close, 24 hr/day medical supervision in concert with the patient's rehab needs make it unreasonable for this patient to be served in a less intensive setting? Yes 2. Co-Morbidities requiring supervision/potential complications: Pain management, neurogenic bowel and bladder, hypertension 3. Due to bladder management, bowel management, safety, skin/wound care, disease management, medication administration, pain management and patient education, does the patient require 24 hr/day rehab nursing? Yes 4. Does the patient require coordinated care of a physician, rehab nurse, PT (1-2 hrs/day, 5 days/week) and OT (1-2 hrs/day, 5 days/week) to address physical and functional deficits in the context of the above medical diagnosis(es)? Yes Addressing deficits in the following areas: balance, endurance, locomotion, strength, transferring, bowel/bladder control, bathing, dressing, feeding, grooming, toileting and psychosocial support 5. Can the patient actively participate in an intensive therapy program of at least 3 hrs of therapy per day at least 5 days per week? Yes 6. The potential for patient to make measurable gains while on inpatient rehab is excellent 7. Anticipated  functional outcomes upon discharge from inpatient rehab are supervision and min assist  with PT, supervision and min assist with OT, n/a with SLP. 8. Estimated rehab length of stay to reach the above functional goals is: 20-25 days 9. Anticipated D/C setting: Home 10. Anticipated post D/C treatments: HH therapy 11. Overall Rehab/Functional Prognosis: excellent  RECOMMENDATIONS: This patient's condition is appropriate for continued rehabilitative care in the following setting: CIR Patient has agreed to participate in recommended  program. Yes Note that insurance prior authorization may be required for reimbursement for recommended care.  Comment: Rehab Admissions Coordinator to follow up.  Thanks,  Ranelle OysterZachary T. Swartz, MD, [place] DomFAAPMR    Trish MageLogue, Donavin Audino M, RN 03/15/2017

## 2017-03-15 NOTE — H&P (Signed)
  Physical Medicine and Rehabilitation Admission H&P    Chief Complaint  Patient presents with  . Fall  : HPI: Victor [name] is a 61 y.o. 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 Patient lives with elderly parents reported to be independent prior to admission working as a barber. Presented 03/07/2017 with increasing weakness lower extremities. A lumbar MRI was unrevealing. 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 Underwent thrombectomy of left brachial artery, unsuccessful despite multiple attempts by vascular surgery 03/08/2017  as well as placement of lumbar drain. Hospital course complicated by hypotension that did improve after a bolus of IV fluids. Physical and Occupational therapy evaluation completed with recommendations of physical medicine rehabilitation consult    Review of Systems  Constitutional: Negative for chills and fever.  HENT: Negative for hearing loss.   Eyes: Negative for blurred vision and double vision.  Respiratory: Positive for shortness of breath.   Cardiovascular: Positive for palpitations and leg swelling.  Gastrointestinal: Positive for constipation. Negative for nausea and vomiting.  Genitourinary: Positive for urgency.  Musculoskeletal: Positive for joint pain and myalgias.  Skin: Negative for rash.  Neurological: Positive for sensory change.  All other systems reviewed and are negative.  Past Medical History:  Diagnosis Date  . History of kidney stones   . Hypertension   . Thoracic aortic aneurysm (HCC) 09/2014   Past Surgical History:  Procedure Laterality Date  . AORTIC ARCH DEBRANCHING N/A 01/21/2017   Performed by Gerhardt, Edward B, MD at [facility]  . CARDIAC CATHETERIZATION    . LEFT  FEMORAL  ARTERY -RIGHT FEMORAL ARTERY BYPASS GRAFT USING 8MM X 30 CM HEMASHIELD GOLD GRAFT N/A 10/07/2014   Performed by Early, Todd F, MD at [facility]  . REDO STERNOTOMY N/A 01/21/2017   Performed by Gerhardt, Edward B, MD at [facility]  . Replacement of Aortic Arch with circulatory arrest N/A 01/21/2017   Performed by Gerhardt, Edward B, MD at [facility]  . RIGHT/LEFT HEART CATH AND CORONARY ANGIOGRAPHY- Diagnostic Only N/A 01/11/2017   Performed by Cooper, Michael, MD at [facility]  . s/p thoracic aneurysm repair    . THORACIC AORTIC ENDOVASCULAR STENT GRAFT N/A 01/21/2017   Performed by Brabham, Vance W, MD at [facility]  . THORACIC ASCENDING ANEURYSM REPAIR (AAA) N/A 10/06/2014   Performed by Gerhardt, Edward B, MD at [facility]  . THROMBECTOMY of Left subclavian artery Left 03/08/2017   Performed by Dickson, Christopher S, MD at [facility]  . TRANSESOPHAGEAL ECHOCARDIOGRAM (TEE) N/A 01/21/2017   Performed by Gerhardt, Edward B, MD at [facility]  . VASCULAR SURGERY     Family History  Problem Relation Age of Onset  . Heart murmur Mother    Social History:  reports that he quit smoking about 2 years ago. His smoking use included cigarettes and cigars. He quit after 35.00 years of use. he has never used smokeless tobacco. He reports that he does not drink alcohol or use drugs. Allergies: No Known Allergies Medications Prior to Admission  Medication Sig Dispense Refill  . aspirin 81 MG EC tablet Take 1 tablet (81 mg total) by mouth daily.    . losartan (COZAAR) 25 MG tablet Take 1 tablet (25 mg total) by mouth daily. 30 tablet 1  .   metoprolol tartrate (LOPRESSOR) 25 MG tablet Take 0.5 tablets (12.5 mg total) by mouth 2 (two) times daily. (Patient taking differently: Take 25 mg 2 (two) times daily by mouth. ) 30 tablet 1  . oxyCODONE 10 MG TABS Take 1 tablet (10 mg total) by mouth every 6 (six) hours as needed for severe pain. 30 tablet 0  . amiodarone (PACERONE) 200 MG tablet Please take 2 tabs twice a day for 2 days and  then take one tab twice a day until we see you in the office. 70 tablet 1  . chlorproMAZINE (THORAZINE) 10 MG tablet Take 1 tablet (10 mg total) by mouth every 8 (eight) hours as needed (hiccups). (Patient not taking: Reported on 03/07/2017) 20 tablet 0    Drug Regimen Review Drug regimen was reviewed and remains appropriate with no significant issues identified  Home: Home Living Family/patient expects to be discharged to:: Private residence Living Arrangements: Parent Available Help at Discharge: Family, Available PRN/intermittently Type of Home: House Home Access: Stairs to enter Entrance Stairs-Number of Steps: 8 Home Layout: Multi-level Alternate Level Stairs-Number of Steps: Split level Bathroom Shower/Tub: Tub/shower unit Home Equipment: None   Functional History: Prior Function Level of Independence: Independent  Functional Status:  Mobility: Bed Mobility Overal bed mobility: Needs Assistance Bed Mobility: Supine to Sit Rolling: Max assist Sidelying to sit: Max assist Supine to sit: Mod assist, HOB elevated General bed mobility comments: HOB 30 degrees with use of rail and assist to move LLE to EOB, sequential cues and mod assist to fully pivot pelvis and extend trunk. Cues for bil UE support and positioning for sitting balance Transfers Overall transfer level: Needs assistance Equipment used: (bed pad) Transfers: Squat Pivot Transfers Squat pivot transfers: +2 physical assistance, Total assist  Lateral/Scoot Transfers: Mod assist General transfer comment: pt with drop arm recliner and pt with cues for sequence, hand position and anterior trunk translation. Pt able to move and position bil LE and pivot bed >recliner>bed>recliner for 3 trials with cues and assist for anterior translation, blocking left knee and control of trunk and pelvis      ADL: ADL Overall ADL's : Needs assistance/impaired Eating/Feeding: Independent Eating/Feeding Details (indicate cue type  and reason): supported sitting (pt cannot maintain sitting unsupported) Grooming: Minimal assistance Grooming Details (indicate cue type and reason): supported sitting (pt cannot maintain sitting unsupported) Upper Body Bathing: Minimal assistance Upper Body Bathing Details (indicate cue type and reason): supported sitting (pt cannot maintain sitting unsupported) Lower Body Bathing: Maximal assistance, Bed level Upper Body Dressing : Maximal assistance Upper Body Dressing Details (indicate cue type and reason): supported sitting (pt cannot maintain sitting unsupported) Lower Body Dressing: Total assistance, Bed level Toilet Transfer: Total assistance, +2 for physical assistance Toilet Transfer Details (indicate cue type and reason): squat pivot with use of bed pad Toileting- Clothing Manipulation and Hygiene: Total assistance, Bed level  Cognition: Cognition Overall Cognitive Status: Within Functional Limits for tasks assessed Orientation Level: Oriented X4 Cognition Arousal/Alertness: Awake/alert Behavior During Therapy: WFL for tasks assessed/performed Overall Cognitive Status: Within Functional Limits for tasks assessed  Physical Exam: Blood pressure 122/70, pulse 84, temperature 98.1 F (36.7 C), temperature source Oral, resp. rate 20, height 5' 7" (1.702 m), weight 76.5 kg (168 lb 10.4 oz), SpO2 100 %. Physical Exam  Vitals reviewed. Constitutional: He appears well-developed and well-nourished. No distress.  HENT:  Head: Normocephalic.  Eyes: EOM are normal.  Neck: Normal range of motion. Neck supple. No thyromegaly present.  Cardiovascular: Exam reveals   no gallop and no friction rub.  No murmur heard. Cardiac rate controlled  Respiratory: Effort normal and breath sounds normal. No respiratory distress. He has no wheezes. He has no rales.  GI: Soft. Bowel sounds are normal. He exhibits no distension.  Genitourinary:  Genitourinary Comments: Foley tube in place   Skin. Warm  and dry Neurological. Patient answers basic questions provides his name as well as date of birth. Follow simple commands. UE 5/5 bilaterally.  Bilateral hip flexion is 1 to 1+ out of 5 knee extension 1 to 1+ out of 5 hip abduction and abduction are 1+ out of 5.  Bilateral ankle dorsiflexion and plantar flexion are 2+out of 5.   Sensation is decreased in both legs to light touch below the nipple line but somewhat inconsistent.  He can sense general touch in pain stimulation.  Deep tendon reflexes are 1+ in all 4 limbs He can sense general touch and position sense to a degree Psychiatric: Patient's cooperative but generally flat and disengaged        Medical Problem List and Plan: 1.  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 Status post thrombectomy of left brachial artery, unsuccessful despite multiple attempts by vascular surgery 03/08/2017 as well as recent aortic arch replacement followed by TEVAR procedure 01/21/2017  -Admit to inpatient rehab 2.  DVT Prophylaxis/Anticoagulation: SCDs. Check vascular study 3. Pain Management: Hydrocodone as needed 4. Mood: Provide emotional support 5. Neuropsych: This patient is capable of making decisions on his own behalf. 6. Skin/Wound Care: Routine skin checks 7. Fluids/Electrolytes/Nutrition: Routine I&O's with follow-up chemistries 8. Hypertension. Cozaar 25 mg daily, Lopressor 12.5 mg twice a day. Monitor when out of bed and check orthostatic blood pressures. 9. Neurogenic bowel and bladder. Establish bowel program. Provided education. Remove Foley catheter tube and begin voiding trials    Post Admission Physician Evaluation: 1. Functional deficits secondary  to thoracic spinal cord infarct with subsequent paraparesis and bilateral sensory loss below the level of injury. 2. Patient is admitted to receive collaborative, interdisciplinary care between the physiatrist, rehab nursing staff, and therapy team. 3. Patient's level of  medical complexity and substantial therapy needs in context of that medical necessity cannot be provided at a lesser intensity of care such as a SNF. 4. Patient has experienced substantial functional loss from his/her baseline which was documented above under the "Functional History" and "Functional Status" headings.  Judging by the patient's diagnosis, physical exam, and functional history, the patient has potential for functional progress which will result in measurable gains while on inpatient rehab.  These gains will be of substantial and practical use upon discharge  in facilitating mobility and self-care at the household level. 5. Physiatrist will provide 24 hour management of medical needs as well as oversight of the therapy plan/treatment and provide guidance as appropriate regarding the interaction of the two. 6. The Preadmission Screening has been reviewed and patient status is unchanged unless otherwise stated above. 7. 24 hour rehab nursing will assist with bladder management, bowel management, safety, skin/wound care, disease management, medication administration, pain management and patient education  and help integrate therapy concepts, techniques,education, etc. 8. PT will assess and treat for/with: Lower extremity strength, range of motion, stamina, balance, functional mobility, safety, adaptive techniques and equipment, neuromuscular reeducation, ego support, family education, orthotics, wheelchair assessment and set up.   Goals are: Supervision to minimal assistance. 9. OT will assess and treat for/with: ADL's, functional mobility, safety, upper extremity strength, adaptive techniques and equipment , neuromuscular   reeducation, ego support, community reentry, family education.   Goals are: Supervision to minimal assistance. Therapy may proceed with showering this patient. 10. SLP will assess and treat for/with: n/a.  Goals are: n/a. 11. Case Management and Social Worker will assess and  treat for psychological issues and discharge planning. 12. Team conference will be held weekly to assess progress toward goals and to determine barriers to discharge. 13. Patient will receive at least 3 hours of therapy per day at least 5 days per week. 14. ELOS: 20- 25 days       15. Prognosis:  good     Maleia Weems T. Dmitriy Gair, MD, FAAPMR [facility] 03/15/2017  Daniel J Angiulli, PA-C 03/15/2017 

## 2017-03-15 NOTE — Plan of Care (Signed)
Patient able to participate in care and passive ROM this shift. Patient voices desire for progression stating "I will be able to walk again" pain controlled with ordered medication. Patient able to adjust self in bed in a position of desired comfort.

## 2017-03-15 NOTE — Progress Notes (Signed)
Physical Therapy Treatment Patient Details Name: Victor [name] MRN: [id number] DOB: July 28, 1955 Today's Date: 03/15/2017    History of Present Illness 61yo admitted with Bil LE weakness with spinal cord infarct T9-L1, 11/12 severe left subclavian thrombus with unsuccessful thrombectomy 11/13, epidural drain 11/12-11/15. PMH: 9/28 TAVR/redo sternotomy with left subclavian BPG,  thoracic aneurysm repair, left right fem-fem BPG, CAD, HTN, CKD    PT Comments    Pt pleasant and eager to mobilize but unable to recall prior session with scooting transfer. After demonstration and practice pt able to recall and performed repeated trials for sequencing, education and carry over. Pt with improved balance and endurance with mobility this session with NT present and educated as well. Parents present during session and educated for progression and sequence. Will continue to follow.     Follow Up Recommendations  CIR;Supervision/Assistance - 24 hour     Equipment Recommendations  Wheelchair (measurements PT);Wheelchair cushion (measurements PT);Hospital bed    Recommendations for Other Services       Precautions / Restrictions Precautions Precautions: Fall Precaution Comments: pt no longer requires sternal precautions per TCTS    Mobility  Bed Mobility   Bed Mobility: Supine to Sit;Sit to Supine     Supine to sit: Min assist Sit to supine: Min assist   General bed mobility comments: cues for sequence with increased time and pt using bil UE to move bil LE onto and off of bed. Pt requires assist to fully clear legs onto surface and assist for balance particularly with supine to sit with posterior LOB  Transfers Overall transfer level: Needs assistance              Lateral/Scoot Transfers: Min assist General transfer comment: min assist with knee blocked and cues for sequence with pt performing x 5 trials to and from bed with pt able to verbalize sequence and setup on last 2 trials  for other staff. Pt with maintained anterior trunk translation with pivots today and cues to move legs  Ambulation/Gait                 Stairs            Wheelchair Mobility    Modified Rankin (Stroke Patients Only)       Balance Overall balance assessment: Needs assistance Sitting-balance support: Bilateral upper extremity supported Sitting balance-Leahy Scale: Fair                                      Cognition Arousal/Alertness: Awake/alert Behavior During Therapy: WFL for tasks assessed/performed Overall Cognitive Status: Impaired/Different from baseline Area of Impairment: Memory                     Memory: Decreased short-term memory         General Comments: pt unable to recall how he transferred with prior session and required sequential cues and re-education for transfer technique      Exercises General Exercises - Lower Extremity Long Arc Quad: AAROM;Right;10 reps;Seated Hip Flexion/Marching: AAROM;Right;10 reps;Seated    General Comments        Pertinent Vitals/Pain Pain Assessment: No/denies pain    Home Living                      Prior Function            PT Goals (current goals can now  be found in the care plan section) Progress towards PT goals: Progressing toward goals    Frequency           PT Plan Current plan remains appropriate    Co-evaluation              AM-PAC PT "6 Clicks" Daily Activity  Outcome Measure  Difficulty turning over in bed (including adjusting bedclothes, sheets and blankets)?: A Lot Difficulty moving from lying on back to sitting on the side of the bed? : Unable Difficulty sitting down on and standing up from a chair with arms (e.g., wheelchair, bedside commode, etc,.)?: Unable Help needed moving to and from a bed to chair (including a wheelchair)?: A Lot Help needed walking in hospital room?: Total Help needed climbing 3-5 steps with a railing? :  Total 6 Click Score: 8    End of Session   Activity Tolerance: Patient tolerated treatment well Patient left: in bed;with call bell/phone within reach;with nursing/sitter in room;with family/visitor present Nurse Communication: Mobility status;Other (comment)(sequence for transfer) PT Visit Diagnosis: Other symptoms and signs involving the nervous system (R29.898);Other abnormalities of gait and mobility (R26.89);Muscle weakness (generalized) (M62.81)     Time: [id number] PT Time Calculation (min) (ACUTE ONLY): 23 min  Charges:  $Therapeutic Activity: 23-37 mins                    G Codes:       Victor [name], PT [phone]    Victor [name] 03/15/2017, 11:55 AM

## 2017-03-15 NOTE — Discharge Summary (Signed)
Physician Discharge Summary  [name]:[id number] DOB: 10/07/1955 DOA: 03/07/2017  PCP: Billee Cashing, MD  Admit date: 03/07/2017 Discharge date: 03/15/2017  Admitted From: Home Disposition:  CIR   Recommendations for Outpatient Follow-up:  1. Follow up with PCP in 1 week 2. Consider repeat thoracic/lumbar MRI with contrast if lower extremity weakness does not improve in 2 months  Discharge Condition: Stable CODE STATUS: Full  Diet recommendation: Heart healthy   Brief/Interim Summary: Victor [name] is a 61 y.o.African American malewith a history of aortic arch replacement with bypass to his innominate, left cca and left subclavian arteries followed by [facility] for dissection with aneurysm on 01/21/17. The night prior to his presentation, he noted that his legs were weak and he could not walk (about 2AM). He presented to the ED at 3AM at which time a workup was begun. Boney spine injury was first ruled out w/ xrays and CT. A lumbar MRI was also unrevealing. Neurology was then consulted, and thoracic MRI was obtained which was c/w a probable spinal cord infarction. [facility] was asked to see the pt for admission, and discussed the case w/ T[facility], who suggested a CTangio as well a a lumbar drain. CTa revealed luminal thrombus and severe stenosis of the L proximal subclavian. He ultimately underwent lumbar drain placement on 11/12, and a failed attempt at left brachial artery thrombectomy on 11/13. He worked with PT OT and was recommended for CIR.   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   losartan 25 MG tablet Commonly known as:  COZAAR Take 1 tablet (25 mg total) by mouth daily.   metoprolol tartrate 25 MG tablet Commonly known as:  LOPRESSOR Take 0.5 tablets (12.5 mg total) by mouth 2 (two) times daily. What changed:  how much to take   Oxycodone HCl 10 MG Tabs Take 1 tablet (10 mg total) by mouth every 6 (six) hours as needed for severe pain.      Follow-up Information    [name], [name], MD. Schedule an appointment as soon as possible for a visit in 1 week(s).   Specialty:  Family Medicine Contact information: [address] [place] [address]  [phone]          No Known Allergies  Consultations:  Cardiothoracic surgery  Anesthesiology   Vascular surgery  Neurology   Procedures/Studies: Dg Chest 2 View  Result Date: 03/07/2017 CLINICAL DATA:  Unwitnessed fall, dizziness. Decreased sensation in extremities. EXAM: CHEST  2 VIEW COMPARISON:  Chest radiograph February 22, 2017 FINDINGS: Cardiac silhouette is mildly enlarged. Re- demonstration of thoracic aortic aneurysm, status post stent graft repair and median  sternotomy. Minimal RIGHT lung base scarring. Mild chronic bronchitic changes without pleural effusion or focal consolidation. No pneumothorax. Old LEFT rib fractures. IMPRESSION: Mild cardiomegaly.  No acute pulmonary process. Re- demonstration of thoracic aortic aneurysm and stent graft repair. Electronically Signed   By: Awilda Metro M.D.   On: 03/07/2017 06:11   Dg Chest 2 View  Result Date: 02/22/2017 CLINICAL DATA:  Status post aneurysm repair 10/06/2014, now with shortness of breath. Hypertension. EXAM: CHEST  2 VIEW COMPARISON:  01/27/2017. FINDINGS: Stable cardiomediastinal silhouette. Stable appearance of thoracic aortic graft. Native aorta extends considerably beyond the graft. There is no consolidation or edema. Chronic scarring RIGHT mid to lower lung zone. No effusion or pneumothorax. No osseous finding of acuity. IMPRESSION: Stable appearance. No definite active findings or interval change from recent priors. Electronically Signed   By: Elsie Stain M.D.   On: 02/22/2017 15:35   Dg Thoracic Spine 2 View  Result Date: 03/07/2017 CLINICAL DATA:  Dizziness, unwitnessed fall. EXAM: THORACIC SPINE 2 VIEWS COMPARISON:  Chest radiograph February 22, 2017 FINDINGS: There is no evidence of thoracic spine fracture. Alignment is normal. No other significant bone abnormalities are identified. Thoracic aorta stent graft. IMPRESSION: No acute fracture deformity or malalignment. Electronically Signed   By: Awilda Metro M.D.   On: 03/07/2017 06:08   Dg Lumbar Spine Complete  Result Date: 03/07/2017 CLINICAL DATA:  Larey Seat from bed.  Low back pain this morning. EXAM: LUMBAR SPINE - COMPLETE 4+ VIEW COMPARISON:  CT abdomen and pelvis December 09, 2016 FINDINGS: Five non rib-bearing lumbar-type vertebral bodies are intact scratch with maintenance of the lumbar lordosis. Stable minimal grade 1 L5-S1 retrolisthesis on degenerative basis. Moderate L5-S1 facet arthropathy. Intervertebral disc heights are  normal. No destructive bony lesions. Sacroiliac joints are symmetric. Included prevertebral and paraspinal soft tissue planes are non-suspicious. Included chest demonstrates descending thoracic stent graft. IMPRESSION: No acute fracture deformity. Similar minimal grade 1 L5-S1 anterolisthesis on degenerative basis. Electronically Signed   By: Awilda Metro M.D.   On: 03/07/2017 04:11   Ct Angio Neck W Or Wo Contrast  Result Date: 03/07/2017 CLINICAL DATA:  Concern for spinal cord infarction. Recent aortic dissection and aneurysm repair. EXAM: CT ANGIOGRAPHY NECK TECHNIQUE: Multidetector CT imaging of the neck was performed using the standard protocol during bolus administration of intravenous contrast. Multiplanar CT image reconstructions and MIPs were obtained to evaluate the vascular anatomy. Carotid stenosis measurements (when applicable) are obtained utilizing NASCET criteria, using the distal internal carotid diameter as the denominator. CONTRAST:  ISOVUE-370 IOPAMIDOL (ISOVUE-370) INJECTION 76% COMPARISON:  None. FINDINGS: Repaired aortic root. Interval stent graft and arch repair with arch debranching including aorto left subclavian bypass. A stent graft partially covers the distal arch, with proximal margin near the left subclavian ostium. The aorto left subclavian graft is severely stenotic at its origin with luminal filling defect consistent with thrombus. The downstream native vessel is smooth and widely patent. The left vertebral artery shows no filling defect, dissection, or stenosis. No stenosis or filling defects  seen in the right brachiocephalic or left carotid circulation. Chest:  Reported separately. Neck: No incidental mass or inflammation noted. Remote blowout fracture of the left orbital floor. Critical Value/emergent results were called by telephone at the time of interpretation on 03/07/2017 at 12:54 pm to Dr. Shelly Flatten , who verbally acknowledged these results. IMPRESSION:  1. Interval aortic dissection repair with arch debranching. 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 The other great vessels are widely patent. No embolic disease seen in the neck; no left vertebral stenosis. 2. Chest CTA reported separately. Electronically Signed   By: Marnee Spring M.D.   On: 03/07/2017 13:00   Ct Cervical Spine Wo Contrast  Result Date: 03/07/2017 CLINICAL DATA:  61 year old male with trauma. EXAM: CT CERVICAL SPINE WITHOUT CONTRAST TECHNIQUE: Multidetector CT imaging of the cervical spine was performed without intravenous contrast. Multiplanar CT image reconstructions were also generated. COMPARISON:  Chest CT dated 12/09/2016 FINDINGS: Evaluation of this exam is limited due to motion artifact. Alignment: No acute subluxation. Skull base and vertebrae: No acute fracture. No primary bone lesion or focal pathologic process. There is incomplete bony fusion of posterior ring of C1. Soft tissues and spinal canal: No prevertebral fluid or swelling. No visible canal hematoma. Disc levels: There are degenerative changes most prominent at C6-C7 where there is endplate irregularity and disc space narrowing. Upper chest: There is a nondisplaced fracture of the right first rib at the costovertebral junction. This fracture is age indeterminate but appears new compared to the CT of 12/09/2016 and is suspicious for an acute fracture. Clinical correlation is recommended. There is biapical paraseptal emphysema. Other: None IMPRESSION: 1. No definite acute/traumatic cervical spine pathology. Evaluation however is limited due to motion artifact. 2. Nondisplaced fracture of the right first rib at the costovertebral junction, possibly acute. Clinical correlation is recommended. Electronically Signed   By: Elgie Collard M.D.   On: 03/07/2017 04:12   Ct Thoracic Spine Wo Contrast  Result Date: 03/08/2017 CLINICAL DATA:  Low back pain. Lower extremity paresis. Spinal  cord infarction. Evaluation for spinal hematoma following drain placement. EXAM: CT THORACIC AND LUMBAR SPINE WITHOUT CONTRAST TECHNIQUE: Multidetector CT imaging of the thoracic and lumbar spine was performed without contrast. Multiplanar CT image reconstructions were also generated. COMPARISON:  Thoracic and lumbar spine MRIs 03/07/2017. Chest, abdomen, and pelvis CTA 03/07/2017. FINDINGS: CT THORACIC SPINE FINDINGS Alignment: Minimal anterolisthesis of C7 on T1 related to facet arthrosis. Vertebrae: No fracture or destructive osseous process. Paraspinal and other soft tissues: Thoracic aortic dissection status post surgical repair and stent graft placement, more fully evaluated on yesterday's CTA. Small left pleural effusion. Paraseptal emphysema. Bilateral lower lobe subsegmental atelectasis. Disc levels: CSF drain terminates at the T10 level. No sizable epidural hematoma is apparent on CT, though sensitivity is much lower than with MRI. Minimal thoracic spondylosis. Moderately advanced disc degeneration at C6-7. Moderate facet arthrosis at C7-T1. CT LUMBAR SPINE FINDINGS Segmentation: 5 lumbar type vertebrae. Alignment: Trace retrolisthesis of L2 on L3 and trace anterolisthesis of L5 on S1. Vertebrae: No evidence of acute fracture or destructive osseous process. Left L5 pars defect. Paraspinal and other soft tissues: Partially visualized thoracoabdominal aortic dissection and stent graft, more fully evaluated on yesterday's CTA. CSF drain enters the canal at the L1-2 level and courses superiorly in the ventral canal. A small amount of left-sided epidural gas is present near the drain at L1-2. Minimal gas is also noted along the medial margin of the left  psoas in this region. Disc levels: No sizable epidural hematoma is apparent on CT. Mild disc space narrowing at L2-3. Multilevel facet arthrosis, severe on the right at L5-S1. Multilevel neural foraminal stenosis as described on MRI. IMPRESSION: 1. No epidural  hematoma apparent on CT in the thoracic or lumbar spine. CSF drain in place. 2. Partially visualized aortic dissection as described on recent CTA. Small left pleural effusion. Preliminary findings of no epidural hematoma reviewed in person with Dr. Noreene LarssonJoslin on 03/08/2017 at 1:30 p.m. Electronically Signed   By: Sebastian AcheAllen  Grady M.D.   On: 03/08/2017 14:26   Ct Lumbar Spine Wo Contrast  Result Date: 03/08/2017 CLINICAL DATA:  Low back pain. Lower extremity paresis. Spinal cord infarction. Evaluation for spinal hematoma following drain placement. EXAM: CT THORACIC AND LUMBAR SPINE WITHOUT CONTRAST TECHNIQUE: Multidetector CT imaging of the thoracic and lumbar spine was performed without contrast. Multiplanar CT image reconstructions were also generated. COMPARISON:  Thoracic and lumbar spine MRIs 03/07/2017. Chest, abdomen, and pelvis CTA 03/07/2017. FINDINGS: CT THORACIC SPINE FINDINGS Alignment: Minimal anterolisthesis of C7 on T1 related to facet arthrosis. Vertebrae: No fracture or destructive osseous process. Paraspinal and other soft tissues: Thoracic aortic dissection status post surgical repair and stent graft placement, more fully evaluated on yesterday's CTA. Small left pleural effusion. Paraseptal emphysema. Bilateral lower lobe subsegmental atelectasis. Disc levels: CSF drain terminates at the T10 level. No sizable epidural hematoma is apparent on CT, though sensitivity is much lower than with MRI. Minimal thoracic spondylosis. Moderately advanced disc degeneration at C6-7. Moderate facet arthrosis at C7-T1. CT LUMBAR SPINE FINDINGS Segmentation: 5 lumbar type vertebrae. Alignment: Trace retrolisthesis of L2 on L3 and trace anterolisthesis of L5 on S1. Vertebrae: No evidence of acute fracture or destructive osseous process. Left L5 pars defect. Paraspinal and other soft tissues: Partially visualized thoracoabdominal aortic dissection and stent graft, more fully evaluated on yesterday's CTA. CSF drain enters  the canal at the L1-2 level and courses superiorly in the ventral canal. A small amount of left-sided epidural gas is present near the drain at L1-2. Minimal gas is also noted along the medial margin of the left psoas in this region. Disc levels: No sizable epidural hematoma is apparent on CT. Mild disc space narrowing at L2-3. Multilevel facet arthrosis, severe on the right at L5-S1. Multilevel neural foraminal stenosis as described on MRI. IMPRESSION: 1. No epidural hematoma apparent on CT in the thoracic or lumbar spine. CSF drain in place. 2. Partially visualized aortic dissection as described on recent CTA. Small left pleural effusion. Preliminary findings of no epidural hematoma reviewed in person with Dr. Noreene LarssonJoslin on 03/08/2017 at 1:30 p.m. Electronically Signed   By: Sebastian AcheAllen  Grady M.D.   On: 03/08/2017 14:26   Mr Cervical Spine Wo Contrast  Result Date: 03/11/2017 CLINICAL DATA:  Initial evaluation for acute spinal cord infarction, T10, with lower extremity weakness. EXAM: MRI CERVICAL AND LUMBAR SPINE WITHOUT CONTRAST TECHNIQUE: Multiplanar and multiecho pulse sequences of the cervical spine, to include the craniocervical junction and cervicothoracic junction, and lumbar spine, were obtained without intravenous contrast. COMPARISON:  Prior thoracic and lumbar MRIs from 03/07/2017. FINDINGS: MRI CERVICAL SPINE FINDINGS Alignment: Study degraded by motion artifact. Straightening of the normal cervical lordosis. Trace anterolisthesis of C7 on T1, likely due to chronic facet degeneration. Vertebrae: Vertebral body heights maintained without evidence for acute or chronic fracture. Bone marrow signal intensity within normal limits. No discrete or worrisome osseous lesion. Mild reactive marrow edema about the right C7-T1 facet  due to facet degeneration. Cord: Signal intensity within the cervical spinal cord is normal. No cord signal abnormality to suggest cord infarction or other acute process. Cord caliber  within normal limits. Posterior Fossa, vertebral arteries, paraspinal tissues: Chronic microvascular ischemic changes noted within the pons. Visualized brain and posterior fossa within normal limits. Craniocervical junction normal. Paraspinous soft tissues within normal limits. Normal intravascular flow voids present within the vertebral arteries bilaterally. Scattered nodules noted within the left thyroid lobe, largest of which measures 13 mm, of doubtful significance. Disc levels: C2-C3: Mild facet degeneration. No significant canal or foraminal stenosis. C3-C4: Shallow central disc protrusion indents the ventral thecal sac. Resultant mild spinal stenosis. Uncovertebral hypertrophy without significant foraminal encroachment. C4-C5: Mild uncovertebral hypertrophy with facet degeneration. No significant stenosis. C5-C6: Minimal disc bulge with mild bilateral uncovertebral hypertrophy. Moderate facet degeneration, slightly greater on the right. No canal stenosis. Moderate right with mild left C6 foraminal stenosis. C6-C7: Chronic diffuse degenerative disc osteophyte with intervertebral disc space narrowing. Bilateral uncovertebral spurring. Broad posterior component flattens and partially effaces the ventral thecal sac. Resultant mild spinal stenosis without significant cord deformity. Moderate bilateral C7 foraminal stenosis, left greater than right. C7-T1: Trace anterolisthesis. Bilateral facet degeneration. No significant canal stenosis. Mild bilateral C8 foraminal stenosis. Visualized upper thoracic spine within normal limits. MRI LUMBAR SPINE FINDINGS Segmentation: Normal segmentation with the lowest well-formed disc labeled the L5-S1 level. Alignment: Trace anterolisthesis of L5 on S1, with trace retrolisthesis of L2 on L3, stable. Vertebral bodies otherwise normally aligned with preservation of the normal lumbar lordosis. Vertebrae: Vertebral body heights maintained. No evidence for acute or interval fracture.  Bone marrow signal intensity within normal limits. Subcentimeter hemangioma noted within the L5 vertebral body. No worrisome osseous lesions. Conus medullaris: Extends to the T12-L1 level. Abnormal T2 signal intensity seen within the visualized MRI is (series 12, image 8). Finding likely reflects evolving spinal cord infarct. Possible small amount of subdural fluid related to recent lumbar drain. Paraspinal and other soft tissues: Soft tissue edema within the lower paraspinous soft tissues, like related to recent lumbar drain. Mild edema present with posterior left psoas muscle as well (Series 14, image 26), which may reflect early denervation changes. Complex infrarenal aortic aneurysm with dissection grossly stable from previous exams. Complex right renal cyst with internal fluid fluid level also grossly similar. Disc levels: L1-2:  Unremarkable. L2-3: Trace retrolisthesis. Diffuse disc bulge with bilateral small subarticular 2 extraforaminal disc protrusions, stable. Mild facet hypertrophy. No significant spinal stenosis. Stable mild bilateral L2 foraminal narrowing. L3-4: Diffuse disc bulge with disc desiccation. Broad right extraforaminal disc protrusion closely approximating the exiting right L3 nerve root, stable. Facet and ligamentum flavum hypertrophy. No canal stenosis. Mild to moderate right L3 foraminal narrowing. L4-5: Mild facet hypertrophy. Central canal widely patent. Mild bilateral L4 foraminal stenosis, slightly greater on the right. L5-S1: Anterolisthesis. Chronic left-sided pars defect. Severe right-sided facet arthropathy. No significant canal stenosis. Mild to moderate right neural foraminal stenosis. IMPRESSION: MRI CERVICAL SPINE IMPRESSION: 1. Examination had to be partially truncated due to patient's inability to tolerate the full length of the exam. Postcontrast imaging was not performed. 2. Normal MRI appearance of the cervical spinal cord. No evidence for cord infarction or other  abnormality. 3. 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 MRI LUMBAR SPINE IMPRESSION: 1. Abnormal cord signal intensity within the visualized distal spinal cord to the level of the conus, increased  in constant acuity relative to previous thoracic spine MRI, most likely reflecting evolving spinal cord infarct. 2. Mild paraspinous edema as above, most likely related to recent lumbar drain placement. 3. Multilevel degenerative changes, stable as to previously described on recent MRI. Electronically Signed   By: Rise Mu M.D.   On: 03/11/2017 00:44   Mr Lumbar Spine Wo Contrast  Result Date: 03/11/2017 CLINICAL DATA:  Initial evaluation for acute spinal cord infarction, T10, with lower extremity weakness. EXAM: MRI CERVICAL AND LUMBAR SPINE WITHOUT CONTRAST TECHNIQUE: Multiplanar and multiecho pulse sequences of the cervical spine, to include the craniocervical junction and cervicothoracic junction, and lumbar spine, were obtained without intravenous contrast. COMPARISON:  Prior thoracic and lumbar MRIs from 03/07/2017. FINDINGS: MRI CERVICAL SPINE FINDINGS Alignment: Study degraded by motion artifact. Straightening of the normal cervical lordosis. Trace anterolisthesis of C7 on T1, likely due to chronic facet degeneration. Vertebrae: Vertebral body heights maintained without evidence for acute or chronic fracture. Bone marrow signal intensity within normal limits. No discrete or worrisome osseous lesion. Mild reactive marrow edema about the right C7-T1 facet due to facet degeneration. Cord: Signal intensity within the cervical spinal cord is normal. No cord signal abnormality to suggest cord infarction or other acute process. Cord caliber within normal limits. Posterior Fossa, vertebral arteries, paraspinal tissues: Chronic microvascular ischemic changes noted within the pons. Visualized brain and  posterior fossa within normal limits. Craniocervical junction normal. Paraspinous soft tissues within normal limits. Normal intravascular flow voids present within the vertebral arteries bilaterally. Scattered nodules noted within the left thyroid lobe, largest of which measures 13 mm, of doubtful significance. Disc levels: C2-C3: Mild facet degeneration. No significant canal or foraminal stenosis. C3-C4: Shallow disc protrusion indents the ventral thecal sac. Resultant mild spinal stenosis. Uncovertebral hypertrophy without significant foraminal encroachment. C4-C5: Mild uncovertebral hypertrophy with facet degeneration. No significant stenosis. C5-C6: Minimal disc bulge with mild bilateral uncovertebral hypertrophy. Moderate facet degeneration, slightly greater on the right. No canal stenosis. Moderate right with mild left C6 foraminal stenosis. C6-C7: Chronic diffuse degenerative disc osteophyte with intervertebral disc space narrowing. Bilateral uncovertebral spurring. Broad posterior component flattens and partially faces the ventral thecal sac. Resultant mild spinal stenosis without significant cord deformity. Moderate bilateral C7 foraminal stenosis, left greater than right. C7-T1: Trace anterolisthesis. Bilateral facet degeneration. No significant canal stenosis. Mild bilateral C8 foraminal stenosis. Visualized upper thoracic spine within normal limits. MRI LUMBAR SPINE FINDINGS Segmentation: Normal segmentation with the lowest well-formed disc labeled the L5-S1 level. Alignment: Trace anterolisthesis of L5 on S1 with trace retrolisthesis of L2 on L3, stable. Vertebral bodies otherwise normally aligned with preservation of the normal lumbar lordosis. Vertebrae: Vertebral body heights maintained. No evidence for acute or interval fracture. Bone marrow signal intensity within normal limits. subcentimeter hemangioma noted within the L5 vertebral body. No worrisome osseous lesions. Conus medullaris: Extends to  the T12-L1 level. Abnormal T2 signal intensity within the visualized cord is again seen, more prevalent as compared to previous MRI (series 12, image 8). Finding likely reflects evolving spinal cord infarct. Possible small amount of subdural fluid related to recent lumbar drain. Paraspinal and other soft tissues: Soft tissue edema within the lower paraspinous soft tissues, likely related to recent lumbar drain. Mild edema present within the left psoas muscle as well (series 14, image 26), which may reflect early denervation changes. 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 Disc levels: L1-2:  Unremarkable. L2-3: Trace retrolisthesis. Diffuse disc bulge with bilateral small subarticular to extraforaminal disc protrusions,  stable. Mild facet hypertrophy. No significant spinal stenosis. Stable mild bilateral L2 foraminal narrowing. L3-4: Diffuse disc bulge with disc desiccation. Broad right extraforaminal disc protrusion closely approximating the exiting right L3 nerve root, stable. Facet and ligamentum flavum hypertrophy. No canal stenosis. Mild to moderate right L3 foraminal narrowing. L4-5: Mild facet hypertrophy. Central canal widely patent. Mild bilateral L4 foraminal stenosis, slightly greater on the right. L5-S1: Anterolisthesis. Chronic left-sided pars defect. Severe right-sided facet arthropathy. No significant canal stenosis. Mild to moderate right neural foraminal stenosis. IMPRESSION: MRI CERVICAL SPINE IMPRESSION: 1. Examination had to be partially truncated due to patient's inability to tolerate the full length of the exam. Postcontrast imaging was not performed. 2. Normal MRI appearance of the cervical spinal cord. No evidence for cord infarction or other abnormality. 3. 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 MRI LUMBAR SPINE IMPRESSION: 1. 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 2. Mild paraspinous edema as above, most likely related to recent lumbar drain placement. 3. Multilevel degenerative changes, stable as to previously described on recent MRI. Electronically Signed   By: Rise Mu M.D.   On: 03/11/2017 00:59   Mr Lumbar Spine Wo Contrast  Result Date: 03/07/2017 CLINICAL DATA:  Decreased sensation and lower extremity weakness. Fall. Neurological decline. EXAM: MRI LUMBAR SPINE WITHOUT CONTRAST TECHNIQUE: Multiplanar, multisequence MR imaging of the lumbar spine was performed. No intravenous contrast was administered. COMPARISON:  Lumbar spine radiographs March 07, 2017 at 0347 hours and CT chest, abdomen and pelvis December 09, 2016 FINDINGS: SEGMENTATION: For the purposes of this report, the last well-formed intervertebral disc will be reported as L5-S1. ALIGNMENT: Maintained lumbar lordosis. Minimal grade 1 L5-S1 anterolisthesis. Chronic LEFT L5 pars interarticularis defect. Minimal grade 1 L2-3 retrolisthesis. VERTEBRAE:Vertebral bodies are intact. Mild L2-3 and minimal L3-4 disc height loss with mild desiccation. Mild chronic discogenic endplate changes L2-3. No abnormal or acute bone marrow signal. CONUS MEDULLARIS: Conus medullaris terminates at T12-L1 and demonstrates normal morphology and signal characteristics. Cauda equina is normal. PARASPINAL AND SOFT TISSUES: Nonacute. Complex infrarenal aortic aneurysm and dissection similar to prior CT given differences in imaging technique. DISC LEVELS: T12-L1, L1-2: No disc bulge, canal stenosis nor neural foraminal narrowing. L2-3: Retrolisthesis. Annular bulging. Small bilateral subarticular to extraforaminal disc protrusions. No canal stenosis. Mild bilateral neural foraminal narrowing. L3-4: Annular bulging. Small broad-based  RIGHT extraforaminal disc protrusion encroaching upon the exited LEFT L3 nerve. Minimal facet arthropathy and ligamentum flavum redundancy without canal stenosis. Mild to moderate RIGHT neural foraminal narrowing. L4-5: No disc bulge. Mild facet arthropathy and ligamentum flavum redundancy without canal stenosis. Mild RIGHT greater than LEFT neural foraminal narrowing. L5-S1: Anterolisthesis. Annular bulging. Severe RIGHT facet arthropathy. No canal stenosis. Mild RIGHT neural foraminal narrowing. IMPRESSION: 1. Minimal grade 1 L5-S1 anterolisthesis. Chronic RIGHT L5 pars interarticularis defect. No acute osseous process. 2. Degenerative change of the lumbar spine. 3. No canal stenosis. Neural foraminal narrowing L2-3 through L5-S1: Mild moderate on the RIGHT at L3-4. Electronically Signed   By: Awilda Metro M.D.   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 Status post aortic arch replacement and endograft repair  on 01/26/2017. Abrupt onset lower extremity weakness, weakness to the point that the patient is unable to walk. Flaccid lower extremities and absent rectal tone EXAM: MRI THORACIC SPINE WITHOUT CONTRAST TECHNIQUE: Multiplanar, multisequence MR imaging of the thoracic spine was performed. No intravenous contrast was administered. COMPARISON:  Lumbar MRI 0431 hours today. FINDINGS: The examination had to be discontinued prior to completion due to pain and claustrophobia, patient refusal to continue. Only sagittal Sagittal (T1, T2, and STIR) imaging of the thoracic spine was obtained obtained. Limited sagittal T1 imaging of the cervical spine also was obtained. Limited cervical spine imaging: Chronic disc and endplate degeneration at C6-C7 with mild retrolisthesis, and mild anterolisthesis at the cervicothoracic junction. Thoracic spine  segmentation:  Appears normal. Alignment:  Normal thoracic vertebral height and alignment. Vertebrae: No marrow edema or evidence of acute osseous abnormality. Visualized bone marrow signal is within normal limits. Cord: Spinal cord signal and morphology appears within normal limits from the cervicothoracic junction to the mid T9 level. From the mid to lower T9 level to the conus there is abnormal T2 and STIR hyperintensity within the cord (series 5, image 6 and series 8, image 6). The central cord is affected, with minimal if any spinal cord expansion. The anterior and posterior cord surfaces appear spared. The T1 signal within the cord here remains normal. Paraspinal and other soft tissues: Negative visualized posterior paraspinal soft tissues. No prevertebral soft tissue edema or fluid. Partially visible abnormal thoracic and upper abdominal aorta -see also series 6, image 1 on the lumbar images today. Disc levels: No thoracic spinal stenosis. Normal for age thoracic intervertebral discs, with occasional disc desiccation. No definite thoracic disc herniation. Intermittent thoracic posterior element hypertrophy, maximal at the cervicothoracic junction where there is mild anterolisthesis. IMPRESSION: 1. The examination had to be discontinued prior to completion, but nonetheless the lower thoracic spinal cord signal appears abnormal from the lower T9 level to the conus. The constellation of clinical and imaging findings were discussed by telephone with Dr. Gwenevere Abbot On 03/07/2017 at 0843 hours, who advised that on exam the patient has at T10 cord sensory level. We discussed that the combination of abrupt onset lower extremity weakness, known abnormal aorta, and central appearing cord signal abnormality are suspicious for spinal cord infarct. 2. No thoracic spinal stenosis and mild for age thoracic spine degeneration except at the cervicothoracic junction where mild anterolisthesis is associated with facet  arthropathy. Electronically Signed   By: Odessa Fleming M.D.   On: 03/07/2017 08:57   Ct Angio Chest/abd/pel For Dissection W And/or W/wo  Result Date: 03/07/2017 CLINICAL DATA:  Back pain, acute lower extremity weakness. EXAM: CT ANGIOGRAPHY CHEST, ABDOMEN AND PELVIS TECHNIQUE: Multidetector CT imaging through the chest, abdomen and pelvis was performed using the standard protocol during bolus administration of intravenous contrast. Multiplanar reconstructed images and MIPs were obtained and reviewed to evaluate the vascular anatomy. CONTRAST:  ISOVUE-370 IOPAMIDOL (ISOVUE-370) INJECTION 76% COMPARISON:  CT scan of December 09, 2016.  MRI of January 05, 2017. FINDINGS: CTA CHEST FINDINGS Cardiovascular: Status post surgical repair of ascending thoracic aortic dissection. There is been interval placement of stent graft extending from proximal portion of transverse aortic arch and through descending thoracic aorta to the proximal abdominal aorta. There has been interval thrombosis of most of the dissection involving the descending thoracic aorta noted on prior exam. The right innominate and left common carotid arteries are widely patent without stenosis. 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 Aneurysmal dilatation of descending thoracic aorta is noted with maximum measured diameter 6.2 cm seen proximally. Mediastinum/Nodes: Stable right peritracheal lymph nodes are noted which most likely are inflammatory or reactive in etiology. Stable enlargement of thyroid gland is noted. The esophagus is unremarkable. No new adenopathy is noted. Lungs/Pleura: No pneumothorax or pleural effusion is noted. Stable bulla formation is noted in both upper lobes. Minimal bilateral posterior basilar subsegmental atelectasis is noted. Musculoskeletal: No chest wall abnormality. No acute or significant osseous findings. Review of the MIP images  confirms the above findings. CTA ABDOMEN AND PELVIS FINDINGS VASCULAR Aorta: Thoracic aortic dissection is again noted extending into proximal abdominal aorta, terminating in the infrarenal aorta. It is unchanged compared to prior exam. Stable 5 cm proximal abdominal aortic aneurysm is noted. Celiac: Arises predominantly from the false lumen, but no significant stenosis of thrombus is noted. SMA: Patent without evidence of aneurysm, dissection, vasculitis or significant stenosis. Renals: Right renal artery arises from false lumen, while left renal artery arises from true lumen. No significant stenosis is noted. IMA: Patent without evidence of aneurysm, dissection, vasculitis or significant stenosis. Inflow: Iliac arteries are widely patent without significant stenosis. Thrombosed fem-fem bypass graft is noted. 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 Veins: No obvious venous abnormality within the limitations of this arterial phase study. Review of the MIP images confirms the above findings. NON-VASCULAR Hepatobiliary: No focal liver abnormality is seen. No gallstones, gallbladder wall thickening, or biliary dilatation. Pancreas: Unremarkable. No pancreatic ductal dilatation or surrounding inflammatory changes. Spleen: Normal in size without focal abnormality. Adrenals/Urinary Tract: Adrenal glands appear normal. Stable 2.3 cm low density is noted in right kidney. No hydronephrosis or renal obstruction is noted. Urinary bladder is unremarkable. Stomach/Bowel: Stomach is within normal limits. Appendix appears normal. No evidence of bowel wall thickening, distention, or inflammatory changes. Lymphatic: No significant adenopathy is noted. Reproductive: Prostate is unremarkable. Other: Large left inguinal hernia is noted which contains loops of small bowel, but does not result in incarceration or obstruction. Musculoskeletal: No acute or significant osseous  findings. Review of the MIP images confirms the above findings. IMPRESSION: Status post surgical repair of ascending thoracic aortic dissection. Interval placement of stent graft extending from proximal transverse aortic arch and through descending thoracic aorta into abdominal aorta. There is nearly complete thrombosis of false lumen since prior exam. Aneurysmal dilatation of descending thoracic aorta is noted with maximum diameter of 6.2 cm. Abdominal aortic dissection is unchanged which extends to infrarenal abdominal aorta. Stable 5 cm proximal abdominal aortic aneurysm is noted. The celiac and right renal arteries arise predominantly from the false lumen. Stable large left inguinal hernia is noted which contains loops of small bowel, but does not result in incarceration or obstruction. 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 Stable emphysematous disease is noted in both upper lobes. Electronically Signed   By: Lupita Raider, M.D.   On: 03/07/2017 13:01    S/p epidural catheter placed 11/12 by anesthesiology, removed 11/15   S/p thrombectomy left subclavian artery, unsuccessful despite multiple attempts by vascular surgery      Discharge Exam: Vitals:   03/15/17 0800 03/15/17 0809  BP: (!) 146/93 122/70  Pulse: 79 84  Resp: 16 20  Temp: 98.9 F (37.2 C) 98.1 F (36.7  C)  SpO2:  100%   Vitals:   03/15/17 0314 03/15/17 0400 03/15/17 0800 03/15/17 0809  BP:  (!) 147/87 (!) 146/93 122/70  Pulse:   79 84  Resp:  15 16 20   Temp: 98.7 F (37.1 C)  98.9 F (37.2 C) 98.1 F (36.7 C)  TempSrc: Oral  Oral Oral  SpO2:    100%  Weight:      Height:        General: Pt is alert, awake, not in acute distress Cardiovascular: RRR, S1/S2 +, no rubs, no gallops Respiratory: CTA bilaterally, no wheezing, no rhonchi Abdominal: Soft, NT, ND, bowel sounds + Extremities: no edema, no  cyanosis CNS: RLE diminished in strength, LLE paresis     The results of significant diagnostics from this hospitalization (including imaging, microbiology, ancillary and laboratory) are listed below for reference.     Microbiology: Recent Results (from the past 240 hour(s))  Culture, blood (Routine X 2) w Reflex to ID Panel     Status: None   Collection Time: 03/07/17  9:35 AM  Result Value Ref Range Status   Specimen Description BLOOD LEFT ANTECUBITAL  Final   Special Requests   Final    BOTTLES DRAWN AEROBIC AND ANAEROBIC Blood Culture adequate volume   Culture NO GROWTH 5 DAYS  Final   Report Status 03/12/2017 FINAL  Final  Culture, blood (Routine X 2) w Reflex to ID Panel     Status: None   Collection Time: 03/07/17  9:50 AM  Result Value Ref Range Status   Specimen Description BLOOD LEFT HAND  Final   Special Requests   Final    BOTTLES DRAWN AEROBIC AND ANAEROBIC Blood Culture adequate volume   Culture NO GROWTH 5 DAYS  Final   Report Status 03/12/2017 FINAL  Final  MRSA PCR Screening     Status: None   Collection Time: 03/12/17  3:26 AM  Result Value Ref Range Status   MRSA by PCR NEGATIVE NEGATIVE Final    Comment:        The GeneXpert MRSA Assay (FDA approved for NASAL specimens only), is one component of a comprehensive MRSA colonization surveillance program. It is not intended to diagnose MRSA infection nor to guide or monitor treatment for MRSA infections.      Labs: BNP (last 3 results) No results for input(s): BNP in the last 8760 hours. Basic Metabolic Panel: Recent Labs  Lab 03/10/17 0302  NA 137  K 3.7  CL 107  CO2 24  GLUCOSE 91  BUN 17  CREATININE 1.09  CALCIUM 7.9*   Liver Function Tests: Recent Labs  Lab 03/10/17 0302  AST 11*  ALT 10*  ALKPHOS 70  BILITOT 0.8  PROT 5.3*  ALBUMIN 2.7*   No results for input(s): LIPASE, AMYLASE in the last 168 hours. No results for input(s): AMMONIA in the last 168 hours. CBC: Recent Labs   Lab 03/10/17 0302 03/12/17 1840  WBC 9.4 7.2  HGB 10.7* 11.1*  HCT 34.0* 35.6*  MCV 83.3 83.8  PLT 192 251   Cardiac Enzymes: No results for input(s): CKTOTAL, CKMB, CKMBINDEX, TROPONINI in the last 168 hours. BNP: Invalid input(s): POCBNP CBG: Recent Labs  Lab 03/09/17 1613  GLUCAP 94   D-Dimer No results for input(s): DDIMER in the last 72 hours. Hgb A1c No results for input(s): HGBA1C in the last 72 hours. Lipid Profile No results for input(s): CHOL, HDL, LDLCALC, TRIG, CHOLHDL, LDLDIRECT in the last 72 hours. Thyroid  function studies No results for input(s): TSH, T4TOTAL, T3FREE, THYROIDAB in the last 72 hours.  Invalid input(s): FREET3 Anemia work up No results for input(s): VITAMINB12, FOLATE, FERRITIN, TIBC, IRON, RETICCTPCT in the last 72 hours. Urinalysis    Component Value Date/Time   COLORURINE YELLOW 03/07/2017 1143   APPEARANCEUR CLEAR 03/07/2017 1143   LABSPEC 1.042 (H) 03/07/2017 1143   PHURINE 5.0 03/07/2017 1143   GLUCOSEU NEGATIVE 03/07/2017 1143   HGBUR NEGATIVE 03/07/2017 1143   BILIRUBINUR NEGATIVE 03/07/2017 1143   KETONESUR NEGATIVE 03/07/2017 1143   PROTEINUR NEGATIVE 03/07/2017 1143   NITRITE NEGATIVE 03/07/2017 1143   LEUKOCYTESUR NEGATIVE 03/07/2017 1143   Sepsis Labs Invalid input(s): PROCALCITONIN,  WBC,  LACTICIDVEN Microbiology Recent Results (from the past 240 hour(s))  Culture, blood (Routine X 2) w Reflex to ID Panel     Status: None   Collection Time: 03/07/17  9:35 AM  Result Value Ref Range Status   Specimen Description BLOOD LEFT ANTECUBITAL  Final   Special Requests   Final    BOTTLES DRAWN AEROBIC AND ANAEROBIC Blood Culture adequate volume   Culture NO GROWTH 5 DAYS  Final   Report Status 03/12/2017 FINAL  Final  Culture, blood (Routine X 2) w Reflex to ID Panel     Status: None   Collection Time: 03/07/17  9:50 AM  Result Value Ref Range Status   Specimen Description BLOOD LEFT HAND  Final   Special Requests    Final    BOTTLES DRAWN AEROBIC AND ANAEROBIC Blood Culture adequate volume   Culture NO GROWTH 5 DAYS  Final   Report Status 03/12/2017 FINAL  Final  MRSA PCR Screening     Status: None   Collection Time: 03/12/17  3:26 AM  Result Value Ref Range Status   MRSA by PCR NEGATIVE NEGATIVE Final    Comment:        The GeneXpert MRSA Assay (FDA approved for NASAL specimens only), is one component of a comprehensive MRSA colonization surveillance program. It is not intended to diagnose MRSA infection nor to guide or monitor treatment for MRSA infections.      Time coordinating discharge: 30 minutes  SIGNED:  Noralee Stain, DO [facility] Pager [phone]  If 7PM-7AM, please contact night-coverage [email or web address] Password [email or web address] 03/15/2017, 11:25 AM

## 2017-03-15 NOTE — Care Management Note (Signed)
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  PTA, pt independent, lives with family member.                Action/Plan: PT/OT recommending CIR; hopeful for rehab admission upon medical stability.    Expected Discharge Date:  03/15/17               Expected Discharge Plan:  IP Rehab Facility  In-House Referral:     Discharge planning Services  CM Consult  Post Acute Care Choice:    Choice offered to:     DME Arranged:    DME Agency:     HH Arranged:    HH Agency:     Status of Service:  Completed, signed off  If discussed at [employer] Meetings, dates discussed:    Additional Comments:  03/15/17 J. Tarika Mckethan, RN, BSN Pt medically stable for discharge and has been accepted for admission on inpatient rehab unit.  Plan dc to CIR later today.    Quintella BatonJulie W. Abrea Henle, RN, BSN  Trauma/Neuro ICU Case Manager [phone]

## 2017-03-15 NOTE — Progress Notes (Signed)
   03/15/17 0900  Clinical Encounter Type  Visited With Patient  Consult/Referral To Chaplain  Spiritual Encounters  Spiritual Needs Other (Comment)  Chaplain visited with the PT he declined to speak.

## 2017-03-15 NOTE — Progress Notes (Signed)
Patient resting in bed at this time, eyes closed. Even rise and fall of chest. NAD noted, will continue to monitor.

## 2017-03-15 NOTE — H&P (Signed)
Physical Medicine and Rehabilitation Admission H&P    Chief Complaint  Patient presents with  . Fall  : HPI: Victor [name] is a 61 y.o. 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 Patient lives with elderly parents reported to be independent prior to admission working as a Paediatric nurse. Presented 03/07/2017 with increasing weakness lower extremities. A lumbar MRI was unrevealing. 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 Underwent thrombectomy of left brachial artery, unsuccessful despite multiple attempts by vascular surgery 03/08/2017  as well as placement of lumbar drain. Hospital course complicated by hypotension that did improve after a bolus of IV fluids. Physical and Occupational therapy evaluation completed with recommendations of physical medicine rehabilitation consult    Review of Systems  Constitutional: Negative for chills and fever.  HENT: Negative for hearing loss.   Eyes: Negative for blurred vision and double vision.  Respiratory: Positive for shortness of breath.   Cardiovascular: Positive for palpitations and leg swelling.  Gastrointestinal: Positive for constipation. Negative for nausea and vomiting.  Genitourinary: Positive for urgency.  Musculoskeletal: Positive for joint pain and myalgias.  Skin: Negative for rash.  Neurological: Positive for sensory change.  All other systems reviewed and are negative.  Past Medical History:  Diagnosis Date  . History of kidney stones   . Hypertension   . Thoracic aortic aneurysm (HCC) 09/2014   Past Surgical History:  Procedure Laterality Date  . AORTIC ARCH DEBRANCHING N/A 01/21/2017   Performed by [name], MD at [facility] OR  . CARDIAC CATHETERIZATION    . LEFT  FEMORAL  ARTERY -RIGHT FEMORAL ARTERY BYPASS GRAFT USING X 30 CM HEMASHIELD GOLD GRAFT N/A 10/07/2014   Performed by Larina Earthly, MD at [facility] OR  . REDO STERNOTOMY N/A 01/21/2017   Performed by [name], MD at [facility] OR  . Replacement of Aortic Arch with circulatory arrest N/A 01/21/2017   Performed by [name], MD at [facility] OR  . RIGHT/LEFT HEART CATH AND CORONARY ANGIOGRAPHY- Diagnostic Only N/A 01/11/2017   Performed by [name], MD at [facility] INVASIVE CV LAB  . s/p thoracic aneurysm repair    . THORACIC AORTIC ENDOVASCULAR STENT GRAFT N/A 01/21/2017   Performed by Nada Libman, MD at [facility] OR  . THORACIC ASCENDING ANEURYSM REPAIR (AAA) N/A 10/06/2014   Performed by [name], MD at [facility] OR  . THROMBECTOMY of Left subclavian artery Left 03/08/2017   Performed by Chuck Hint, MD at [facility] OR  . TRANSESOPHAGEAL ECHOCARDIOGRAM (TEE) N/A 01/21/2017   Performed by [name], MD at [facility] OR  . VASCULAR SURGERY     Family History  Problem Relation Age of Onset  . Heart murmur Mother    Social History:  reports that he quit smoking about 2 years ago. His smoking use included cigarettes and cigars. He quit after 35.00 years of use. he has never used smokeless tobacco. He reports that he does not drink alcohol or use drugs. Allergies: No Known Allergies Medications Prior to Admission  Medication Sig Dispense Refill  . aspirin 81 MG EC tablet Take 1 tablet (81 mg total) by mouth daily.    Marland Kitchen losartan (COZAAR) 25 MG tablet Take 1 tablet (25 mg total) by mouth daily. 30 tablet 1  .  metoprolol tartrate (LOPRESSOR) 25 MG tablet Take 0.5 tablets (12.5 mg total) by mouth 2 (two) times daily. (Patient taking differently: Take 25 mg 2 (two) times daily by mouth. ) 30 tablet 1  . oxyCODONE 10 MG TABS Take 1 tablet (10 mg total) by mouth every 6 (six) hours as needed for severe pain. 30 tablet 0  . amiodarone (PACERONE) 200 MG tablet Please take 2 tabs twice a day for 2 days and  then take one tab twice a day until we see you in the office. 70 tablet 1  . chlorproMAZINE (THORAZINE) 10 MG tablet Take 1 tablet (10 mg total) by mouth every 8 (eight) hours as needed (hiccups). (Patient not taking: Reported on 03/07/2017) 20 tablet 0    Drug Regimen Review Drug regimen was reviewed and remains appropriate with no significant issues identified  Home: Home Living Family/patient expects to be discharged to:: Private residence Living Arrangements: Parent Available Help at Discharge: Family, Available PRN/intermittently Type of Home: House Home Access: Stairs to enter Secretary/administratorntrance Stairs-Number of Steps: 8 Home Layout: Multi-level Alternate Level Stairs-Number of Steps: Split level Bathroom Shower/Tub: Tub/shower unit Home Equipment: None   Functional History: Prior Function Level of Independence: Independent  Functional Status:  Mobility: Bed Mobility Overal bed mobility: Needs Assistance Bed Mobility: Supine to Sit Rolling: Max assist Sidelying to sit: Max assist Supine to sit: Mod assist, HOB elevated General bed mobility comments: HOB 30 degrees with use of rail and assist to move LLE to EOB, sequential cues and mod assist to fully pivot pelvis and extend trunk. Cues for bil UE support and positioning for sitting balance Transfers Overall transfer level: Needs assistance Equipment used: (bed pad) Transfers: Squat Pivot Transfers Squat pivot transfers: +2 physical assistance, Total assist  Lateral/Scoot Transfers: Mod assist General transfer comment: pt with drop arm recliner and pt with cues for sequence, hand position and anterior trunk translation. Pt able to move and position bil LE and pivot bed >recliner>bed>recliner for 3 trials with cues and assist for anterior translation, blocking left knee and control of trunk and pelvis      ADL: ADL Overall ADL's : Needs assistance/impaired Eating/Feeding: Independent Eating/Feeding Details (indicate cue type  and reason): supported sitting (pt cannot maintain sitting unsupported) Grooming: Minimal assistance Grooming Details (indicate cue type and reason): supported sitting (pt cannot maintain sitting unsupported) Upper Body Bathing: Minimal assistance Upper Body Bathing Details (indicate cue type and reason): supported sitting (pt cannot maintain sitting unsupported) Lower Body Bathing: Maximal assistance, Bed level Upper Body Dressing : Maximal assistance Upper Body Dressing Details (indicate cue type and reason): supported sitting (pt cannot maintain sitting unsupported) Lower Body Dressing: Total assistance, Bed level Toilet Transfer: Total assistance, +2 for physical assistance Toilet Transfer Details (indicate cue type and reason): squat pivot with use of bed pad Toileting- Clothing Manipulation and Hygiene: Total assistance, Bed level  Cognition: Cognition Overall Cognitive Status: Within Functional Limits for tasks assessed Orientation Level: Oriented X4 Cognition Arousal/Alertness: Awake/alert Behavior During Therapy: WFL for tasks assessed/performed Overall Cognitive Status: Within Functional Limits for tasks assessed  Physical Exam: Blood pressure 122/70, pulse 84, temperature 98.1 F (36.7 C), temperature source Oral, resp. rate 20, height 5\' 7"  (1.702 m), weight 76.5 kg (168 lb 10.4 oz), SpO2 100 %. Physical Exam  Vitals reviewed. Constitutional: He appears well-developed and well-nourished. No distress.  HENT:  Head: Normocephalic.  Eyes: EOM are normal.  Neck: Normal range of motion. Neck supple. No thyromegaly present.  Cardiovascular: Exam reveals  no gallop and no friction rub.  No murmur heard. Cardiac rate controlled  Respiratory: Effort normal and breath sounds normal. No respiratory distress. He has no wheezes. He has no rales.  GI: Soft. Bowel sounds are normal. He exhibits no distension.  Genitourinary:  Genitourinary Comments: Foley tube in place   Skin. Warm  and dry Neurological. Patient answers basic questions provides his name as well as date of birth. Follow simple commands. UE 5/5 bilaterally.  Bilateral hip flexion is 1 to 1+ out of 5 knee extension 1 to 1+ out of 5 hip abduction and abduction are 1+ out of 5.  Bilateral ankle dorsiflexion and plantar flexion are 2+out of 5.   Sensation is decreased in both legs to light touch below the nipple line but somewhat inconsistent.  He can sense general touch in pain stimulation.  Deep tendon reflexes are 1+ in all 4 limbs He can sense general touch and position sense to a degree Psychiatric: Patient's cooperative but generally flat and disengaged        Medical Problem List and Plan: 1.  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 Status post thrombectomy of left brachial artery, unsuccessful despite multiple attempts by vascular surgery 03/08/2017 as well as recent aortic arch replacement followed by TEVAR procedure 01/21/2017  -Admit to inpatient rehab 2.  DVT Prophylaxis/Anticoagulation: SCDs. Check vascular study 3. Pain Management: Hydrocodone as needed 4. Mood: Provide emotional support 5. Neuropsych: This patient is capable of making decisions on his own behalf. 6. Skin/Wound Care: Routine skin checks 7. Fluids/Electrolytes/Nutrition: Routine I&O's with follow-up chemistries 8. Hypertension. Cozaar 25 mg daily, Lopressor 12.5 mg twice a day. Monitor when out of bed and check orthostatic blood pressures. 9. Neurogenic bowel and bladder. Establish bowel program. Provided education. Remove Foley catheter tube and begin voiding trials    Post Admission Physician Evaluation: 1. Functional deficits secondary  to thoracic spinal cord infarct with subsequent paraparesis and bilateral sensory loss below the level of injury. 2. Patient is admitted to receive collaborative, interdisciplinary care between the physiatrist, rehab nursing staff, and therapy team. 3. Patient's level of  medical complexity and substantial therapy needs in context of that medical necessity cannot be provided at a lesser intensity of care such as a SNF. 4. Patient has experienced substantial functional loss from his/her baseline which was documented above under the "Functional History" and "Functional Status" headings.  Judging by the patient's diagnosis, physical exam, and functional history, the patient has potential for functional progress which will result in measurable gains while on inpatient rehab.  These gains will be of substantial and practical use upon discharge  in facilitating mobility and self-care at the household level. 5. Physiatrist will provide 24 hour management of medical needs as well as oversight of the therapy plan/treatment and provide guidance as appropriate regarding the interaction of the two. 6. The Preadmission Screening has been reviewed and patient status is unchanged unless otherwise stated above. 7. 24 hour rehab nursing will assist with bladder management, bowel management, safety, skin/wound care, disease management, medication administration, pain management and patient education  and help integrate therapy concepts, techniques,education, etc. 8. PT will assess and treat for/with: Lower extremity strength, range of motion, stamina, balance, functional mobility, safety, adaptive techniques and equipment, neuromuscular reeducation, ego support, family education, orthotics, wheelchair assessment and set up.   Goals are: Supervision to minimal assistance. 9. OT will assess and treat for/with: ADL's, functional mobility, safety, upper extremity strength, adaptive techniques and equipment , neuromuscular  reeducation, ego support, community reentry, family education.   Goals are: Supervision to minimal assistance. Therapy may proceed with showering this patient. 10. SLP will assess and treat for/with: n/a.  Goals are: n/a. 11. Case Management and Social Worker will assess and  treat for psychological issues and discharge planning. 12. Team conference will be held weekly to assess progress toward goals and to determine barriers to discharge. 13. Patient will receive at least 3 hours of therapy per day at least 5 days per week. 14. ELOS: 20- 25 days       15. Prognosis:  good     Ranelle OysterZachary T. Chey Rachels, MD, [facility] HospitalFAAPMR [place] [facility] 03/15/2017  Mcarthur Rossettianiel J Angiulli, PA-C 03/15/2017

## 2017-03-15 NOTE — Progress Notes (Signed)
PMR Admission Coordinator Pre-Admission Assessment  Patient: Victor [name] is an 61 y.o., male MRN: [id number] DOB: 09-19-1955 Height:   Weight:                Insurance Information Self pay - no insurance  Medicaid Application Date: Pending      Case Manager:   Disability Application Date: Pending      Case Worker:    Emergency Contact Information Contact Information    Name Relation Home Work [employer]   Ey,[name] [phone]  [phone]   Broadus JohnLewis,[name] Sister [phone]       Current Medical History  Patient Admitting Diagnosis: Thoracic spinal cord infarct with paraplegia   History of Present Illness: A 61 y.o.right handed malewith 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 Patient lives with elderly parentsreported to be independent prior to admission working as a Paediatric nursebarber. Presented 03/07/2017 with increasing weakness lower extremities. A lumbar MRI was unrevealing. 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 Underwent thrombectomy of left brachial artery, unsuccessful despite multiple attempts by vascular surgery 03/08/2017 as well as placement of lumbar drain. Hospital course complicated by hypotension that did improve after a bolus of IV fluids. Physical and Occupational therapy evaluation completed with recommendations of physical medicine rehabilitation consult  Past Medical History  Past Medical History:  Diagnosis Date  . History of kidney stones   . Hypertension   . Thoracic aortic aneurysm (HCC) 09/2014    Family History  family history includes Heart murmur in his mother.  Prior Rehab/Hospitalizations: No previous rehab.  Has the patient had major surgery during 100 days prior to admission? Yes.  He had an aortic dissection  surgery in 08/18.  Current Medications  No current facility-administered medications for this encounter.  No current outpatient medications on file.  Facility-Administered Medications Ordered in Other Encounters:  .  acetaminophen (TYLENOL) tablet 650 mg, 650 mg, Oral, Q6H PRN, 650 mg at 03/15/17 1123 **OR** acetaminophen (TYLENOL) suppository 650 mg, 650 mg, Rectal, Q6H PRN, Gwynneth MunsonWertman, Sara E, PA-C .  alum & mag hydroxide-simeth (MAALOX/MYLANTA) 200-200-20 MG/5ML suspension 15-30 mL, 15-30 mL, Oral, Q2H PRN, Delight OvensGerhardt, Edward B, MD .  aspirin EC tablet 81 mg, 81 mg, Oral, Daily, Marcos EkeWertman, Sara E, PA-C, 81 mg at 03/15/17 0929 .  bisacodyl (DULCOLAX) suppository 10 mg, 10 mg, Rectal, Daily PRN, Marcos EkeWertman, Sara E, PA-C .  docusate sodium (COLACE) capsule 100 mg, 100 mg, Oral, Daily, Delight OvensGerhardt, Edward B, MD, 100 mg at 03/15/17 0929 .  guaiFENesin-dextromethorphan (ROBITUSSIN DM) 100-10 MG/5ML syrup 15 mL, 15 mL, Oral, Q4H PRN, Sheliah PlaneGerhardt, Edward B, MD .  hydrALAZINE (APRESOLINE) injection 5 mg, 5 mg, Intravenous, Q20 Min PRN, Delight OvensGerhardt, Edward B, MD .  HYDROcodone-acetaminophen (NORCO/VICODIN) 5-325 MG per tablet 1-2 tablet, 1-2 tablet, Oral, Q4H PRN, Marcos EkeWertman, Sara E, PA-C, 2 tablet at 03/15/17 1324 .  HYDROmorphone (DILAUDID) injection 0.5 mg, 0.5 mg, Intravenous, Q4H PRN, Marcos EkeWertman, Sara E, PA-C, 0.5 mg at 03/10/17 1117 .  labetalol (NORMODYNE,TRANDATE) injection 10 mg, 10 mg, Intravenous, Q10 min PRN, Delight OvensGerhardt, Edward B, MD .  losartan (COZAAR) tablet 25 mg, 25 mg, Oral, Daily, Marcos EkeWertman, Sara E, PA-C, 25 mg at 03/15/17 0929 .  metoprolol tartrate (LOPRESSOR) injection 2-5 mg, 2-5 mg, Intravenous, Q2H PRN, Sheliah PlaneGerhardt, Edward B, MD .  metoprolol tartrate (LOPRESSOR) tablet 12.5 mg, 12.5 mg, Oral,  BID, Marcos Eke, PA-C, 12.5 mg at 03/15/17 1610 .  ondansetron (ZOFRAN) tablet 4 mg, 4 mg, Oral, Q6H PRN, 4 mg at 03/12/17 0028 **OR** ondansetron (ZOFRAN) injection 4 mg, 4 mg, Intravenous, Q6H PRN, Gwynneth Munson, Sara  E, PA-C .  pantoprazole (PROTONIX) EC tablet 40 mg, 40 mg, Oral, Daily, [name], MD, 40 mg at 03/15/17 0929 .  phenol (CHLORASEPTIC) mouth spray 1 spray, 1 spray, Mouth/Throat, PRN, [name], MD .  senna-docusate (Senokot-S) tablet 1 tablet, 1 tablet, Oral, QHS PRN, Marcos Eke, PA-C, 1 tablet at 03/11/17 1139 .  sodium chloride 0.9 % bolus 1,000 mL, 1,000 mL, Intravenous, Once, Ozella Rocks, MD  Patients Current Diet: No diet orders on file  Precautions / Restrictions     Has the patient had 2 or more falls or a fall with injury in the past year?No.  Patient reports 1 fall PTA due to weakness and several "stumbles".  Prior Activity Level    Home Assistive Devices / Equipment    Prior Device Use: Indicate devices/aids used by the patient prior to current illness, exacerbation or injury? None  Prior Functional Level    Self Care: Did the patient need help bathing, dressing, using the toilet or eating?  Independent  Indoor Mobility: Did the patient need assistance with walking from room to room (with or without device)? Independent  Stairs: Did the patient need assistance with internal or external stairs (with or without device)? Independent  Functional Cognition: Did the patient need help planning regular tasks such as shopping or remembering to take medications? Independent  Current Functional Level Cognition       Extremity Assessment (includes Sensation/Coordination)          ADLs       Mobility       Transfers       Ambulation / Gait / Stairs / Wheelchair Mobility       Posture / Balance      Special needs/care consideration BiPAP/CPAP No CPM No Continuous Drip IV KVO Dialysis No        Life Vest No Oxygen No Special Bed No Trach Size No Wound Vac (area) No      Skin Has a scab on left knee post fall                              Bowel mgmt: Last documented BM 03/04/17 Bladder mgmt: Condom catheter Diabetic mgmt No     Previous Home Environment    Discharge Living Setting    Social/Family/Support Systems    Goals/Additional Needs    Decrease burden of Care through IP rehab admission: N/A  Possible need for SNF placement upon discharge: Not planned  Patient Condition: This patient's medical and functional status has changed since the consult dated: 03/11/17 in which the Rehabilitation Physician determined and documented that the patient's condition is appropriate for intensive rehabilitative care in an inpatient rehabilitation facility. See "History of Present Illness" (above) for medical update. Functional changes are:  Currently requiring min assist for lateral/scoot transfers. Patient's medical and functional status update has been discussed with the Rehabilitation physician and patient remains appropriate for inpatient rehabilitation. Will admit to inpatient rehab today.  Preadmission Screen Completed By:  Trish Mage, 03/15/2017 2:42 PM ______________________________________________________________________   Discussed status with Dr. Riley Kill on 03/15/17 at 1216 and received telephone approval for admission today.  Admission Coordinator:  Lelon Frohlich  M, time 1216/Date 03/15/17

## 2017-03-15 NOTE — PMR Pre-admission (Signed)
PMR Admission Coordinator Pre-Admission Assessment  Patient: Victor [name] is an 61 y.o., male MRN: [id number] DOB: 08-30-1955 Height: 5\' 7"  (170.2 cm) Weight: 76.5 kg (168 lb 10.4 oz)              Insurance Information Self pay - no insurance  Medicaid Application Date: Pending      Case Manager:   Disability Application Date: Pending      Case Worker:    Emergency Contact Information Contact Information    Name Relation Home Work [place] Mother [phone]  [phone]   [name] [phone]       Current Medical History  Patient Admitting Diagnosis: Thoracic spinal cord infarct with paraplegia   History of Present Illness: A 61 y.o.right handed malewith 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 Patient lives with elderly parentsreported to be independent prior to admission working as a Paediatric nurse. Presented 03/07/2017 with increasing weakness lower extremities. A lumbar MRI was unrevealing. 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 Underwent thrombectomy of left brachial artery, unsuccessful despite multiple attempts by vascular surgery 03/08/2017 as well as placement of lumbar drain. Hospital course complicated by hypotension that did improve after a bolus of IV fluids. Physical and Occupational therapy evaluation completed with recommendations of physical medicine rehabilitation consult  Past Medical History  Past Medical History:  Diagnosis Date  . History of kidney stones   . Hypertension   . Thoracic aortic aneurysm (HCC) 09/2014    Family History  family history includes Heart murmur in his mother.  Prior Rehab/Hospitalizations: No previous rehab.  Has the patient had major surgery during 100 days prior to admission?  Yes.  He had an aortic dissection surgery in 08/18.  Current Medications   Current Facility-Administered Medications:  .  acetaminophen (TYLENOL) tablet 650 mg, 650 mg, Oral, Q6H PRN, 650 mg at 03/15/17 1123 **OR** acetaminophen (TYLENOL) suppository 650 mg, 650 mg, Rectal, Q6H PRN, Gwynneth Munson, Sara E, PA-C .  alum & mag hydroxide-simeth (MAALOX/MYLANTA) 200-200-20 MG/5ML suspension 15-30 mL, 15-30 mL, Oral, Q2H PRN, [name], MD .  aspirin EC tablet 81 mg, 81 mg, Oral, Daily, Marcos Eke, PA-C, 81 mg at 03/15/17 0929 .  bisacodyl (DULCOLAX) suppository 10 mg, 10 mg, Rectal, Daily PRN, Marcos Eke, PA-C .  docusate sodium (COLACE) capsule 100 mg, 100 mg, Oral, Daily, [name], MD, 100 mg at 03/15/17 0929 .  guaiFENesin-dextromethorphan (ROBITUSSIN DM) 100-10 MG/5ML syrup 15 mL, 15 mL, Oral, Q4H PRN, Sheliah Plane B, MD .  hydrALAZINE (APRESOLINE) injection 5 mg, 5 mg, Intravenous, Q20 Min PRN, [name], MD .  HYDROcodone-acetaminophen (NORCO/VICODIN) 5-325 MG per tablet 1-2 tablet, 1-2 tablet, Oral, Q4H PRN, Marcos Eke, PA-C, 1 tablet at 03/15/17 [id number] .  HYDROmorphone (DILAUDID) injection 0.5 mg, 0.5 mg, Intravenous, Q4H PRN, Marcos Eke, PA-C, 0.5 mg at 03/10/17 1117 .  labetalol (NORMODYNE,TRANDATE) injection 10 mg, 10 mg, Intravenous, Q10 min PRN, [name], MD .  losartan (COZAAR) tablet 25 mg, 25 mg, Oral, Daily, Marcos Eke, PA-C, 25 mg at 03/15/17 0929 .  metoprolol tartrate (LOPRESSOR) injection 2-5 mg, 2-5 mg, Intravenous, Q2H PRN, [name], MD .  metoprolol tartrate (LOPRESSOR) tablet 12.5 mg, 12.5 mg, Oral, BID, Marcos Eke, PA-C, 12.5 mg at 03/15/17 0929 .  ondansetron (ZOFRAN) tablet 4 mg, 4 mg, Oral, Q6H PRN, 4 mg at 03/12/17 0028 **OR** ondansetron (ZOFRAN) injection 4 mg, 4 mg, Intravenous, Q6H PRN, Gwynneth Munson, Sara E, PA-C .  pantoprazole (PROTONIX) EC tablet 40 mg, 40 mg, Oral, Daily, [name], MD,  40 mg at 03/15/17 0929 .  phenol (CHLORASEPTIC) mouth spray 1 spray, 1 spray, Mouth/Throat, PRN, [name], MD .  senna-docusate (Senokot-S) tablet 1 tablet, 1 tablet, Oral, QHS PRN, Marcos Eke, PA-C, 1 tablet at 03/11/17 1139 .  sodium chloride 0.9 % bolus 1,000 mL, 1,000 mL, Intravenous, Once, Ozella Rocks, MD  Patients Current Diet: Diet Heart Room service appropriate? Yes; Fluid consistency: Thin Diet - low sodium heart healthy  Precautions / Restrictions Precautions Precautions: Fall Precaution Comments: pt no longer requires sternal precautions per TCTS Restrictions Weight Bearing Restrictions: No   Has the patient had 2 or more falls or a fall with injury in the past year?No.  Patient reports 1 fall PTA due to weakness and several "stumbles".  Prior Activity Level Community (5-7x/wk): Worked FT as a Paediatric nurse, was driving.  Home Assistive Devices / Equipment Home Assistive Devices/Equipment: Dan Humphreys (specify type) Home Equipment: None  Prior Device Use: Indicate devices/aids used by the patient prior to current illness, exacerbation or injury? None  Prior Functional Level Prior Function Level of Independence: Independent  Self Care: Did the patient need help bathing, dressing, using the toilet or eating?  Independent  Indoor Mobility: Did the patient need assistance with walking from room to room (with or without device)? Independent  Stairs: Did the patient need assistance with internal or external stairs (with or without device)? Independent  Functional Cognition: Did the patient need help planning regular tasks such as shopping or remembering to take medications? Independent  Current Functional Level Cognition  Overall Cognitive Status: Impaired/Different from baseline Orientation Level: Oriented X4 General Comments: pt unable to recall how he transferred with prior session and required sequential cues and re-education for transfer technique     Extremity Assessment (includes Sensation/Coordination)  Upper Extremity Assessment: Defer to OT evaluation  Lower Extremity Assessment: RLE deficits/detail, LLE deficits/detail RLE Deficits / Details: 2/5: quads, hamstrings, plantarflexion, hip ABD/Aduct, 1/5 dorsiflexion LLE Deficits / Details: 1/5: plantarflexion, quads, 2/5: glutes- no other active ROM    ADLs  Overall ADL's : Needs assistance/impaired Eating/Feeding: Independent Eating/Feeding Details (indicate cue type and reason): supported sitting (pt cannot maintain sitting unsupported) Grooming: Minimal assistance Grooming Details (indicate cue type and reason): supported sitting (pt cannot maintain sitting unsupported) Upper Body Bathing: Minimal assistance Upper Body Bathing Details (indicate cue type and reason): supported sitting (pt cannot maintain sitting unsupported) Lower Body Bathing: Maximal assistance, Bed level Upper Body Dressing : Maximal assistance Upper Body Dressing Details (indicate cue type and reason): supported sitting (pt cannot maintain sitting unsupported) Lower Body Dressing: Total assistance, Bed level Toilet Transfer: Total assistance, +2 for physical assistance Toilet Transfer Details (indicate cue type and reason): squat pivot with use of bed pad Toileting- Clothing Manipulation and Hygiene: Total assistance, Bed level    Mobility  Overal bed mobility: Needs Assistance Bed Mobility: Supine to Sit, Sit to Supine Rolling: Max assist Sidelying to sit: Max assist Supine to sit: Min assist Sit to supine: Min assist General bed mobility comments: cues for sequence with increased time and pt using bil UE to move bil LE onto and off of bed. Pt requires assist to fully clear legs onto surface and assist for balance particularly with  supine to sit with posterior LOB    Transfers  Overall transfer level: Needs assistance Equipment used: (bed pad) Transfers: Squat Pivot Transfers Squat pivot transfers:  +2 physical assistance, Total assist  Lateral/Scoot Transfers: Min assist General transfer comment: min assist with knee blocked and cues for sequence with pt performing x 5 trials to and from bed with pt able to verbalize sequence and setup on last 2 trials for other staff. Pt with maintained anterior trunk translation with pivots today and cues to move legs    Ambulation / Gait / Stairs / Wheelchair Mobility       Posture / Balance Dynamic Sitting Balance Sitting balance - Comments: pt able to perform static sitting with single UE assist. With dynamic movement of transfer pt with posterior LOB x 2 with assist and cues to recover Balance Overall balance assessment: Needs assistance Sitting-balance support: Bilateral upper extremity supported Sitting balance-Leahy Scale: Fair Sitting balance - Comments: pt able to perform static sitting with single UE assist. With dynamic movement of transfer pt with posterior LOB x 2 with assist and cues to recover    Special needs/care consideration BiPAP/CPAP No CPM No Continuous Drip IV KVO Dialysis No        Life Vest No Oxygen No Special Bed No Trach Size No Wound Vac (area) No      Skin Has a scab on left knee post fall                              Bowel mgmt: Last documented BM 03/04/17 Bladder mgmt: Condom catheter Diabetic mgmt No    Previous Home Environment Living Arrangements: Parent Available Help at Discharge: Family, Available PRN/intermittently Type of Home: House Home Layout: Multi-level Alternate Level Stairs-Number of Steps: Split level Home Access: Stairs to enter Secretary/administratorntrance Stairs-Number of Steps: 8 Bathroom Shower/Tub: Tub/shower unit Home Care Services: No  Discharge Living Setting Plans for Discharge Living Setting: House, Lives with (comment)(Lives with mom and dad.) Type of Home at Discharge: House Discharge Home Layout: Multi-level, Bed/bath upstairs(Family room, 1/2 bath in basement level) Alternate Level  Stairs-Number of Steps: 8 steps to kitchen, living room area; 5 more steps up to bedrooms. Discharge Home Access: Stairs to enter Entrance Stairs-Number of Steps: Has 6 steps at front entry and 2 steps at back entry.  Plans to entry at back into basement/family room area. Does the patient have any problems obtaining your medications?: No  Social/Family/Support Systems Patient Roles: Other (Comment)(Has mom, dad, two sisters, nieces/nephews) Contact Information: Lynann BeaverDora Cervone - mom Anticipated Caregiver: mom, dad, sisters, nieces/nephews Anticipated Caregiver's Contact Information: Verlee MonteDora - mom (h) [phone] (c) [phone] Ability/Limitations of Caregiver: Mom works 3 nights a week 7 pm to 7 am; dad works as a Paediatric nursebarber.  Nieces and nephews can assist while mom is working. Caregiver Availability: Other (Comment)(Mom plans for caregivers to assist while she is a work) Discharge Plan Discussed with Primary Caregiver: Yes Is Caregiver In Agreement with Plan?: Yes Does Caregiver/Family have Issues with Lodging/Transportation while Pt is in Rehab?: No  Goals/Additional Needs Patient/Family Goal for Rehab: PT/OT supervision to min assist goals Expected length of stay: 20-25 days Cultural Considerations: None Dietary Needs: Heart diet, thin liquids Equipment Needs: TBD Pt/Family Agrees to Admission and willing to participate: Yes Program Orientation Provided & Reviewed with Pt/Caregiver Including Roles  & Responsibilities: Yes  Decrease burden of Care through IP rehab admission: N/A  Possible need for SNF  placement upon discharge: Not planned  Patient Condition: This patient's medical and functional status has changed since the consult dated: 03/11/17 in which the Rehabilitation Physician determined and documented that the patient's condition is appropriate for intensive rehabilitative care in an inpatient rehabilitation facility. See "History of Present Illness" (above) for medical update.  Functional changes are:  Currently requiring min assist for lateral/scoot transfers. Patient's medical and functional status update has been discussed with the Rehabilitation physician and patient remains appropriate for inpatient rehabilitation. Will admit to inpatient rehab today.  Preadmission Screen Completed By:  Trish MageLogue, Deandria Klute M, 03/15/2017 12:16 PM ______________________________________________________________________   Discussed status with Dr. Riley KillSwartz on 03/15/17 at 1216 and received telephone approval for admission today.  Admission Coordinator:  Trish MageLogue, Hanan Mcwilliams M, time 1216/Date 03/15/17

## 2017-03-15 NOTE — Progress Notes (Signed)
Admit to unit. Oriented to unit, reviewed medications, schedule for rehab and plan of care. Discussed removal of condom catheter; pt requested leave on til morning and off during the day. Incont PTA and fall reported. Abrasion lft knee and rt shin. Reviewed HH diet and food selection and rationale for limitations for diet. Stated an understanding of information reviewed. Victor [name], Victor [name]

## 2017-03-16 ENCOUNTER — Inpatient Hospital Stay (HOSPITAL_COMMUNITY): Payer: Self-pay | Admitting: Physical Therapy

## 2017-03-16 ENCOUNTER — Inpatient Hospital Stay (HOSPITAL_COMMUNITY): Payer: Medicaid Other | Admitting: Occupational Therapy

## 2017-03-16 ENCOUNTER — Inpatient Hospital Stay (HOSPITAL_COMMUNITY): Payer: Medicaid Other

## 2017-03-16 DIAGNOSIS — N319 Neuromuscular dysfunction of bladder, unspecified: Secondary | ICD-10-CM

## 2017-03-16 DIAGNOSIS — M7989 Other specified soft tissue disorders: Secondary | ICD-10-CM

## 2017-03-16 DIAGNOSIS — K592 Neurogenic bowel, not elsewhere classified: Secondary | ICD-10-CM

## 2017-03-16 LAB — CBC WITH DIFFERENTIAL/PLATELET
Basophils Absolute: 0 10*3/uL (ref 0.0–0.1)
Basophils Relative: 0 %
EOS ABS: 0.2 10*3/uL (ref 0.0–0.7)
EOS PCT: 3 %
HCT: 35.1 % — ABNORMAL LOW (ref 39.0–52.0)
Hemoglobin: 10.9 g/dL — ABNORMAL LOW (ref 13.0–17.0)
LYMPHS ABS: 1.5 10*3/uL (ref 0.7–4.0)
LYMPHS PCT: 18 %
MCH: 26.4 pg (ref 26.0–34.0)
MCHC: 31.1 g/dL (ref 30.0–36.0)
MCV: 85 fL (ref 78.0–100.0)
MONO ABS: 0.8 10*3/uL (ref 0.1–1.0)
MONOS PCT: 9 %
Neutro Abs: 5.6 10*3/uL (ref 1.7–7.7)
Neutrophils Relative %: 70 %
PLATELETS: 309 10*3/uL (ref 150–400)
RBC: 4.13 MIL/uL — ABNORMAL LOW (ref 4.22–5.81)
RDW: 16.1 % — AB (ref 11.5–15.5)
WBC: 8.1 10*3/uL (ref 4.0–10.5)

## 2017-03-16 LAB — COMPREHENSIVE METABOLIC PANEL
ALK PHOS: 72 U/L (ref 38–126)
ALT: 10 U/L — AB (ref 17–63)
ANION GAP: 7 (ref 5–15)
AST: 12 U/L — ABNORMAL LOW (ref 15–41)
Albumin: 3 g/dL — ABNORMAL LOW (ref 3.5–5.0)
BILIRUBIN TOTAL: 1.1 mg/dL (ref 0.3–1.2)
BUN: 12 mg/dL (ref 6–20)
CALCIUM: 8.6 mg/dL — AB (ref 8.9–10.3)
CO2: 27 mmol/L (ref 22–32)
CREATININE: 1.13 mg/dL (ref 0.61–1.24)
Chloride: 103 mmol/L (ref 101–111)
GFR calc non Af Amer: >60 mL/min (ref >60)
GLUCOSE: 95 mg/dL (ref 65–99)
Potassium: 4.1 mmol/L (ref 3.5–5.1)
SODIUM: 137 mmol/L (ref 135–145)
TOTAL PROTEIN: 5.8 g/dL — AB (ref 6.5–8.1)

## 2017-03-16 MED ORDER — MORPHINE SULFATE ER 15 MG PO TBCR
15.0000 mg | EXTENDED_RELEASE_TABLET | Freq: Two times a day (BID) | ORAL | Status: DC
  Administered 2017-03-16 – 2017-04-06 (×42): 15 mg via ORAL
  Filled 2017-03-16 (×43): qty 1

## 2017-03-16 MED ORDER — SENNOSIDES-DOCUSATE SODIUM 8.6-50 MG PO TABS
2.0000 | ORAL_TABLET | Freq: Every day | ORAL | Status: DC
  Administered 2017-03-16 – 2017-04-05 (×17): 2 via ORAL
  Filled 2017-03-16 (×21): qty 2

## 2017-03-16 MED ORDER — BISACODYL 10 MG RE SUPP
10.0000 mg | Freq: Every day | RECTAL | Status: DC
  Administered 2017-03-21 – 2017-03-29 (×3): 10 mg via RECTAL
  Filled 2017-03-16 (×15): qty 1

## 2017-03-16 NOTE — Progress Notes (Signed)
Patient information reviewed and entered into eRehab system by Leata Dominy, RN, CRRN, PPS Coordinator.  Information including medical coding and functional independence measure will be reviewed and updated through discharge.    

## 2017-03-16 NOTE — Evaluation (Signed)
Physical Therapy Assessment and Plan  Patient Details  Name: Victor [name] MRN: [id number] Date of Birth: [birth year]  PT Diagnosis: Coordination disorder, Difficulty walking, Muscle weakness, Paraplegia, Pain in back and Paralysis Rehab Potential: Good ELOS: 21-24 days   Today's Date: 03/16/2017 PT Individual Time: 0800-0915 PT Individual Time Calculation (min): 75 min    Problem List:  Patient Active Problem List   Diagnosis Date Noted  . Spinal cord infarction ([place]) 03/15/2017  . Paraplegia ([place]) 03/07/2017  . Bilateral leg weakness 03/07/2017  . S/P thoracic aortic aneurysm repair 01/21/2017  . Coronary artery calcification 12/23/2016  . S/P aortic dissection repair 12/23/2016  . Thoracic aortic aneurysm without rupture ([place]) 12/23/2016  . Preoperative cardiovascular examination 12/23/2016  . Essential hypertension 09/23/2015  . Iron deficiency anemia 01/28/2015  . Weight loss 10/30/2014  . Hospital-acquired pneumonia 10/09/2014  . Ischemic leg 10/09/2014  . Aortic dissection ([place]) 10/07/2014    Past Medical History:  Past Medical History:  Diagnosis Date  . History of kidney stones   . Hypertension   . Thoracic aortic aneurysm ([place]) 09/2014   Past Surgical History:  Past Surgical History:  Procedure Laterality Date  . ASCENDING AORTIC ROOT REPLACEMENT N/A 01/21/2017   Procedure: Replacement of Aortic Arch with circulatory arrest;  Surgeon: Grace Isaac, MD;  Location: [facility];  Service: Open Heart Surgery;  Laterality: N/A;  . CARDIAC CATHETERIZATION    . FEMORAL-FEMORAL BYPASS GRAFT N/A 10/07/2014   Procedure: LEFT  FEMORAL ARTERY -RIGHT FEMORAL ARTERY BYPASS GRAFT USING 8MM X 30 CM HEMASHIELD GOLD GRAFT;  Surgeon: Rosetta Posner, MD;  Location: [facility];  Service: Vascular;  Laterality: N/A;  . RIGHT/LEFT HEART CATH AND CORONARY ANGIOGRAPHY N/A 01/11/2017   Procedure: RIGHT/LEFT HEART CATH AND CORONARY ANGIOGRAPHY- Diagnostic Only;  Surgeon: Sherren Mocha, MD;   Location: [facility];  Service: Cardiovascular;  Laterality: N/A;  . s/p thoracic aneurysm repair    . STERNOTOMY N/A 01/21/2017   Procedure: REDO STERNOTOMY;  Surgeon: Grace Isaac, MD;  Location: [facility];  Service: Open Heart Surgery;  Laterality: N/A;  . TEE WITHOUT CARDIOVERSION N/A 01/21/2017   Procedure: TRANSESOPHAGEAL ECHOCARDIOGRAM (TEE);  Surgeon: Grace Isaac, MD;  Location: [facility];  Service: Open Heart Surgery;  Laterality: N/A;  . THORACIC AORTIC ANEURYSM REPAIR N/A 10/06/2014   Procedure: THORACIC ASCENDING ANEURYSM REPAIR (AAA);  Surgeon: Grace Isaac, MD;  Location: [facility];  Service: Open Heart Surgery;  Laterality: N/A;  hyportermia circulatory arrest and resuspension of aortic valve  . THORACIC AORTIC ENDOVASCULAR STENT GRAFT N/A 01/21/2017   Procedure: THORACIC AORTIC ENDOVASCULAR STENT GRAFT;  Surgeon: Serafina Mitchell, MD;  Location: [facility];  Service: Vascular;  Laterality: N/A;  . THROMBECTOMY ILIAC ARTERY Left 03/08/2017   Procedure: THROMBECTOMY of Left subclavian artery;  Surgeon: Angelia Mould, MD;  Location: [facility];  Service: Vascular;  Laterality: Left;  [facility] VASCULAR SURGERY      Assessment & Plan Clinical Impression: Victor [name] a 61 y.o.right handed malewith 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 Presented 03/07/2017 with increasing weakness lower extremities. A lumbar MRI was unrevealing. 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 Underwent thrombectomy of left brachial artery, unsuccessful despite multiple attempts  by vascular surgery11/13/2018 as well as placement of lumbar drain. Hospital course complicated  by hypotensionthat did improve after a bolus of IV fluids.Physical andOccupational therapy evaluation completed with recommendations of physical medicine rehabilitation consult   Patient transferred to CIR on 03/15/2017 .   Patient currently requires max with mobility secondary to muscle weakness and muscle paralysis, decreased cardiorespiratoy endurance, impaired timing and sequencing, unbalanced muscle activation and decreased coordination and decreased sitting balance, decreased standing balance, decreased postural control, decreased balance strategies and paraparesis.  Prior to hospitalization, patient was independent  with mobility and lived with   in a   home.  Home access is   .  Patient will benefit from skilled PT intervention to maximize safe functional mobility, minimize fall risk and decrease caregiver burden for planned discharge home with 24 hour supervision.  Anticipate patient will benefit from follow up OP at discharge.  PT - End of Session Activity Tolerance: Tolerates 30+ min activity with multiple rests Endurance Deficit: Yes Endurance Deficit Description: fatigues quickly with mobility tasks requiring seated rest breaks PT Assessment Rehab Potential (ACUTE/IP ONLY): Good PT Barriers to Discharge: [place] home environment;Lack of/limited family support PT Barriers to Discharge Comments: 6 STE home, aging parents PT Patient demonstrates impairments in the following area(s): Warehouse manager;Endurance;Motor;Pain;Safety PT Transfers Functional Problem(s): Bed to Chair;Car;Furniture;Bed Mobility PT Locomotion Functional Problem(s): Ambulation;Wheelchair Mobility PT Plan PT Intensity: Minimum of 1-2 x/day ,45 to 90 minutes PT Frequency: 5 out of 7 days PT Duration Estimated Length of Stay: 21-24 days PT Treatment/Interventions: Ambulation/gait training;Discharge planning;Functional mobility training;Psychosocial support;Therapeutic Activities;Wheelchair  propulsion/positioning;Therapeutic Exercise;Skin care/wound management;Neuromuscular re-education;Disease management/prevention;Balance/vestibular training;DME/adaptive equipment instruction;Pain management;Splinting/orthotics;UE/LE Strength taining/ROM;UE/LE Coordination activities;Patient/family education;Functional electrical stimulation;Community reintegration PT Transfers Anticipated Outcome(s): S  PT Locomotion Anticipated Outcome(s): modI w/c management, modA gait in controlled environment PT Recommendation Recommendations for Other Services: Neuropsych consult;Therapeutic Recreation consult Therapeutic Recreation Interventions: Pet therapy;Outing/community reintergration Follow Up Recommendations: Outpatient PT Patient destination: Home Equipment Recommended: To be determined Equipment Details: has no equipment, will likely need w/c  Skilled Therapeutic Intervention Pt received seated in bed, c/o pain as below and agreeable to treatment. PT initial evaluation performed and completed with modA as described below for bed mobility and transfers, maxA for standing/pre-gait and gait with +2 w/c follow for safety. Pt educated in rehab process, goals, estimated LOS, falls prevention and safety awareness and use of staff to assist with transferring, toileting needs, etc. Educated pt in pressure relief; provided handouts with various methods of performing pressure relief, provided timer to keep track of when pressure relief required, and discussed signs of orthostasis with instruction to call for assist to return to bed if needed. Remained seated in w/c at end of session, all needs in reach.   PT Evaluation Precautions/Restrictions Precautions Precautions: Fall Precaution Comments: paraparesis Restrictions Weight Bearing Restrictions: No General Chart Reviewed: Yes Response to Previous Treatment: Not applicable Family/Caregiver Present: No Vital Signs  Pain Pain Assessment Pain Assessment:  0-10 Pain Score: 8  Pain Type: Acute pain Pain Location: Back Pain Orientation: Lower Pain Intervention(s): Medication (See eMAR) Home Living/Prior Functioning Home Living Available Help at Discharge: Family;Available PRN/intermittently Type of Home: House Home Access: Stairs to enter Entrance Stairs-Number of Steps: 6 Home Layout: Multi-level(per report pt able to live in basement with level entry) Prior Function Level of Independence: Independent with gait;Independent with basic ADLs;Independent with homemaking with ambulation  Able to Take Stairs?: Yes Driving: Yes Vocation: Full time employment Vocation Requirements: worked as a Art gallery manager Leisure: Hobbies-yes (Comment) Comments: playing  video games Vision/Perception  Perception Perception: Within Functional Limits Praxis Praxis: Intact  Cognition Overall Cognitive Status: Within Functional Limits for tasks assessed Orientation Level: Oriented X4 Safety/Judgment: Appears intact Sensation Sensation Light Touch: Appears Intact Proprioception: Appears Intact(accurate 10/10 BLE hallux) Coordination Gross Motor Movements are Fluid and Coordinated: No Coordination and Movement Description: impaired d/t strength deficits Heel Shin Test: NT Motor  Motor Motor: Paraplegia Motor - Skilled Clinical Observations: paraparesis  Mobility Bed Mobility Bed Mobility: Rolling Right;Rolling Left;Supine to Sit Rolling Right: 4: Min assist;With rail Rolling Right Details: Verbal cues for technique Rolling Left: With rail;4: Min assist Rolling Left Details: Verbal cues for technique Supine to Sit: 3: Mod assist;HOB flat;With rails Supine to Sit Details: Verbal cues for sequencing;Verbal cues for technique;Verbal cues for precautions/safety;Tactile cues for weight shifting;Tactile cues for sequencing;Tactile cues for placement;Tactile cues for posture Transfers Transfers: Yes Sit to Stand: 2: Max assist;From elevated surface;With upper  extremity assist;Other/comment(in stedy, with standard walker and with hall rail) Sit to Stand Details (indicate cue type and reason): assist for initial boost into standing, then able to maintain balance min/modA for LLE stance control Squat Pivot Transfers: 3: Mod assist Squat Pivot Transfer Details: Verbal cues for precautions/safety;Verbal cues for technique;Tactile cues for sequencing;Tactile cues for placement;Tactile cues for posture;Verbal cues for sequencing Squat Pivot Transfer Details (indicate cue type and reason): cue for head/hips relationship, increased hip clearance over support surface Lateral/Scoot Transfers: 3: Mod assist;With slide board Lateral/Scoot Transfer Details: Verbal cues for technique;Verbal cues for precautions/safety;Tactile cues for weight beaing;Tactile cues for weight shifting;Tactile cues for placement;Verbal cues for sequencing;Manual facilitation for placement Locomotion  Ambulation Ambulation: Yes Ambulation/Gait Assistance: 2: Max assist;Other (comment)(w/c follow) Ambulation Distance (Feet): 5 Feet Assistive device: Other (Comment)(rail in hallway) Ambulation/Gait Assistance Details: Verbal cues for technique;Verbal cues for precautions/safety;Tactile cues for weight beaing;Tactile cues for weight shifting;Tactile cues for sequencing;Tactile cues for placement;Tactile cues for posture;Verbal cues for gait pattern;Verbal cues for sequencing;Manual facilitation for weight bearing;Manual facilitation for placement Ambulation/Gait Assistance Details: assist for LLE stance control d/t buckling Gait Gait: Yes Gait Pattern: Impaired Gait Pattern: Lateral hip instability;Poor foot clearance - right;Poor foot clearance - left;Ataxic;Right foot flat;Left foot flat;Step-through pattern;Decreased stride length;Decreased dorsiflexion - left;Decreased dorsiflexion - right Gait velocity: significantly decreased; non-functional speed Stairs / Additional  Locomotion Stairs: No Architect: Yes Wheelchair Assistance: 5: Investment banker, operational Details: Verbal cues for technique;Verbal cues for Information systems manager: Both upper extremities Wheelchair Parts Management: Needs assistance Distance: 100  Trunk/Postural Assessment  Cervical Assessment Cervical Assessment: Within Functional Limits Thoracic Assessment Thoracic Assessment: Within Functional Limits Lumbar Assessment Lumbar Assessment: Within Functional Limits Postural Control Postural Control: Deficits on evaluation(delayed righting reactions in sitting)  Balance Balance Balance Assessed: Yes Static Sitting Balance Static Sitting - Balance Support: Bilateral upper extremity supported;Feet supported Static Sitting - Level of Assistance: 5: Stand by assistance Dynamic Sitting Balance Dynamic Sitting - Balance Support: Feet supported;Right upper extremity supported Dynamic Sitting - Level of Assistance: 5: Stand by assistance;4: Min assist Dynamic Sitting Balance - Compensations: occasional posterior LOB with mobility activities in sitting Static Standing Balance Static Standing - Balance Support: Bilateral upper extremity supported Static Standing - Level of Assistance: 3: Mod assist Static Standing - Comment/# of Minutes: x10-20 sec with standard walker Dynamic Standing Balance Dynamic Standing - Balance Support: Bilateral upper extremity supported Dynamic Standing - Level of Assistance: 2: Max assist Dynamic Standing - Comments: during pre-gait in walker, gait with hall rail; limited by impaired BLE stance control  Extremity Assessment   BUE WFL   RLE Assessment RLE Assessment: Exceptions to [facility] RLE Strength RLE Overall Strength: Deficits Right Hip Flexion: 1/5 Right Hip Extension: 2/5 Right Hip ABduction: 2/5 Right Hip ADduction: 2/5 Right Knee Flexion: 1/5 Right Knee Extension: 2/5 Right Ankle Dorsiflexion:  0/5 Right Ankle Plantar Flexion: 2/5 LLE Assessment LLE Assessment: Exceptions to WFL LLE Strength LLE Overall Strength: Deficits Left Hip Flexion: 1/5 Left Hip Extension: 2/5 Left Hip ABduction: 2/5 Left Hip ADduction: 2/5 Left Knee Flexion: 1/5 Left Knee Extension: 2/5 Left Ankle Dorsiflexion: 0/5 Left Ankle Plantar Flexion: 2/5   See Function Navigator for Current Functional Status.   Refer to Care Plan for Long Term Goals  Recommendations for other services: Neuropsych and Therapeutic Recreation  Pet therapy and Outing/community reintegration  Discharge Criteria: Patient will be discharged from PT if patient refuses treatment 3 consecutive times without medical reason, if treatment goals not met, if there is a change in medical status, if patient makes no progress towards goals or if patient is discharged from hospital.  The above assessment, treatment plan, treatment alternatives and goals were discussed and mutually agreed upon: by patient  Luberta Mutter 03/16/2017, 9:58 AM

## 2017-03-16 NOTE — Progress Notes (Signed)
Physical Therapy Session Note  Patient Details  Name: Victor [name] MRN: [id number] Date of Birth: 07-26-1955  Today's Date: 03/16/2017 PT Individual Time: 1000-1027 PT Individual Time Calculation (min): 27 min    Skilled Therapeutic Interventions/Progress Updates:    Upon therapist entering pt room, nursing reports that the pt is orthostatic with dizziness and HA.  BP:  110/61 which per nursing is low for him.  Pt states dizziness in sitting is 8/10 and HA: 7/10.  Pt transferred back to bed with +2 assist due to c/o.  Following lying supine, pt HA gradually decreased to 4/10 and dizziness to 5/10.  BP: 112/59 upon therapist finishing session.  Nursing Damaris Schooner([place]) [facility], to don TED hose so TED hose donned.  Therapist educated pt on orthostatic signs/symptoms and need to notify therapist or nursing immediately at onset of symptoms.  Pt verbalized understanding.  Pt additionally educated on need to call nursing if symptoms do not slowly resolve while in supine.  Pt verbalized understanding.  Following session, nursing notified of pt status, pt left with call bell in reach, in supine position with TED hose donned, and bed alarm set.  Therapy Documentation Precautions: orthostatic hypotension   General: PT Amount of Missed Time (min): 33 Minutes PT Missed Treatment Reason: Patient fatigue;Patient ill (Comment)   See Function Navigator for Current Functional Status.   Therapy/Group: Individual Therapy  Miachel Nardelli Elveria RisingM Jamelyn Bovard 03/16/2017, 11:28 AM

## 2017-03-16 NOTE — Evaluation (Signed)
Occupational Therapy Assessment and Plan  Patient Details  Name: Victor [name] MRN: [id number] Date of Birth: [birth year]  OT Diagnosis: acute pain and coordination disorder, paraplegia, muscle weakness Rehab Potential: Rehab Potential (ACUTE ONLY): Fair ELOS: 3 weeks   Today's Date: 03/16/2017 OT Individual Time: 1914-7829 OT Individual Time Calculation (min): 33 min  and Today's Date: 03/16/2017 OT Missed Time: 27 Minutes Missed Time Reason: Unavailable (comment);Patient fatigue    Problem List:  Patient Active Problem List   Diagnosis Date Noted  . Spinal cord infarction ([place]) 03/15/2017  . Paraplegia ([place]) 03/07/2017  . Bilateral leg weakness 03/07/2017  . S/P thoracic aortic aneurysm repair 01/21/2017  . Coronary artery calcification 12/23/2016  . S/P aortic dissection repair 12/23/2016  . Thoracic aortic aneurysm without rupture ([place]) 12/23/2016  . Preoperative cardiovascular examination 12/23/2016  . Essential hypertension 09/23/2015  . Iron deficiency anemia 01/28/2015  . Weight loss 10/30/2014  . Hospital-acquired pneumonia 10/09/2014  . Ischemic leg 10/09/2014  . Aortic dissection ([place]) 10/07/2014    Past Medical History:  Past Medical History:  Diagnosis Date  . History of kidney stones   . Hypertension   . Thoracic aortic aneurysm ([place]) 09/2014   Past Surgical History:  Past Surgical History:  Procedure Laterality Date  . ASCENDING AORTIC ROOT REPLACEMENT N/A 01/21/2017   Procedure: Replacement of Aortic Arch with circulatory arrest;  Surgeon: Grace Isaac, MD;  Location: [place];  Service: Open Heart Surgery;  Laterality: N/A;  . CARDIAC CATHETERIZATION    . FEMORAL-FEMORAL BYPASS GRAFT N/A 10/07/2014   Procedure: LEFT  FEMORAL ARTERY -RIGHT FEMORAL ARTERY BYPASS GRAFT USING 8MM X 30 CM HEMASHIELD GOLD GRAFT;  Surgeon: Rosetta Posner, MD;  Location: [place];  Service: Vascular;  Laterality: N/A;  . RIGHT/LEFT HEART CATH AND CORONARY ANGIOGRAPHY N/A  01/11/2017   Procedure: RIGHT/LEFT HEART CATH AND CORONARY ANGIOGRAPHY- Diagnostic Only;  Surgeon: Sherren Mocha, MD;  Location: [place] [facility];  Service: Cardiovascular;  Laterality: N/A;  . s/p thoracic aneurysm repair    . STERNOTOMY N/A 01/21/2017   Procedure: REDO STERNOTOMY;  Surgeon: Grace Isaac, MD;  Location: [place];  Service: Open Heart Surgery;  Laterality: N/A;  . TEE WITHOUT CARDIOVERSION N/A 01/21/2017   Procedure: TRANSESOPHAGEAL ECHOCARDIOGRAM (TEE);  Surgeon: Grace Isaac, MD;  Location: [place];  Service: Open Heart Surgery;  Laterality: N/A;  . THORACIC AORTIC ANEURYSM REPAIR N/A 10/06/2014   Procedure: THORACIC ASCENDING ANEURYSM REPAIR (AAA);  Surgeon: Grace Isaac, MD;  Location: [place];  Service: Open Heart Surgery;  Laterality: N/A;  hyportermia circulatory arrest and resuspension of aortic valve  . THORACIC AORTIC ENDOVASCULAR STENT GRAFT N/A 01/21/2017   Procedure: THORACIC AORTIC ENDOVASCULAR STENT GRAFT;  Surgeon: Serafina Mitchell, MD;  Location: [place];  Service: Vascular;  Laterality: N/A;  . THROMBECTOMY ILIAC ARTERY Left 03/08/2017   Procedure: THROMBECTOMY of Left subclavian artery;  Surgeon: Angelia Mould, MD;  Location: [place];  Service: Vascular;  Laterality: Left;  Marland Kitchen VASCULAR SURGERY      Assessment & Plan Clinical Impression: Patient is a 61 y.o. year old male with recent admission to the hospital 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 Presented 03/07/2017 with increasing weakness lower extremities. A lumbar MRI was unrevealing. 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 Underwent thrombectomy of left brachial  artery, unsuccessful despite multiple attempts by vascular surgery11/13/2018 as well as placement of lumbar drain. Hospital course complicated by hypotensionthat did improve after a bolus of IV fluids.Physical andOccupational therapy evaluation completed with recommendations of physical medicine rehabilitation consultPatient transferred to CIR on 03/15/2017 .    Patient currently requires set up - total A with basic self-care skills secondary to muscle weakness, decreased cardiorespiratoy endurance, decreased coordination and decreased sitting balance, decreased standing balance and decreased balance strategies.  Prior to hospitalization, patient could complete ADls and IADLs with independent .  Patient will benefit from skilled intervention to decrease level of assist with basic self-care skills prior to discharge home with care partner.  Anticipate patient will require 24 hour supervision and minimal physical assistance and follow up home health.  OT - End of Session Activity Tolerance: Decreased this session Endurance Deficit: Yes Endurance Deficit Description: fatigues quickly with mobility tasks requiring seated rest breaks OT Assessment Rehab Potential (ACUTE ONLY): Fair OT Barriers to Discharge: Lack of/limited family support OT Barriers to Discharge Comments: 24/7 ?? OT Patient demonstrates impairments in the following area(s): Balance;Endurance;Pain;Safety OT Basic ADL's Functional Problem(s): Grooming;Bathing;Dressing;Toileting OT Transfers Functional Problem(s): Toilet;Tub/Shower OT Additional Impairment(s): None OT Plan OT Intensity: Minimum of 1-2 x/day, 45 to 90 minutes OT Frequency: 5 out of 7 days OT Duration/Estimated Length of Stay: 3 weeks OT Treatment/Interventions: Balance/vestibular training;Neuromuscular re-education;Self Care/advanced ADL retraining;Therapeutic Exercise;Wheelchair propulsion/positioning;DME/adaptive equipment instruction;Pain management;Skin  care/wound managment;UE/LE Strength taining/ROM;Community reintegration;Patient/family education;UE/LE Coordination activities;Discharge planning;Functional mobility training;Psychosocial support;Therapeutic Activities OT Self Feeding Anticipated Outcome(s): n/a OT Basic Self-Care Anticipated Outcome(s): mod I - min A OT Toileting Anticipated Outcome(s): Supervision OT Bathroom Transfers Anticipated Outcome(s): Supervision toilet , min A shower OT Recommendation Recommendations for Other Services: Neuropsych consult Patient destination: Home Follow Up Recommendations: 24 hour supervision/assistance;Home health OT Equipment Recommended: To be determined   Skilled Therapeutic Intervention Pt missing 27 minutes secondary to being off unit for testing. OT educated pt on OT purpose, POC, and goals. Pt verbalized understanding and agreement. Pt with 8/10 back pain in supine with RN notified and medication given at end of session. Pt's BP with HOB elevated at 144/74. Pt refusing functional transfers, self care, and bed mobility secondary to increased pain and fatigue from previous sessions. Please see written evaluation for details.   OT Evaluation Precautions/Restrictions  Precautions Precautions: Fall Precaution Comments: paraparesis Restrictions Weight Bearing Restrictions: No General OT Amount of Missed Time: 27 Minutes Vital Signs Therapy Vitals Temp: 98.2 F (36.8 C) Temp Source: Oral Pulse Rate: 90 Resp: 20 BP: (!) 148/71 Patient Position (if appropriate): Lying Oxygen Therapy SpO2: 99 % O2 Device: Not Delivered Pain Pain Assessment Pain Assessment: 0-10 Pain Score: 8  Pain Type: Acute pain Pain Location: Back Pain Orientation: Lower Pain Descriptors / Indicators: Aching;Discomfort;Grimacing Pain Onset: On-going Patients Stated Pain Goal: 4 Pain Intervention(s): RN made aware Multiple Pain Sites: No Home Living/Prior Functioning Home Living Family/patient expects to  be discharged to:: Private residence Living Arrangements: Parent Available Help at Discharge: Family, Available PRN/intermittently Type of Home: House Home Access: Stairs to enter Technical brewer of Steps: 6 Entrance Stairs-Rails: Left Home Layout: Multi-level Alternate Level Stairs-Number of Steps: Split level Bathroom Shower/Tub: Tub/shower unit  Lives With: Family(lives with parents) Prior Function Level of Independence: Independent with gait, Independent with basic ADLs, Independent with homemaking with ambulation  Able to Take Stairs?: Yes Driving: Yes Vocation: Full time  employment Vocation Requirements: worked as a Art gallery manager Leisure: Hobbies-yes (Comment) Comments: playing video games ADL   Vision Baseline Vision/History: No visual deficits Patient Visual Report: No change from baseline Perception  Perception: Within Functional Limits Praxis Praxis: Intact Cognition Overall Cognitive Status: Within Functional Limits for tasks assessed Arousal/Alertness: Awake/alert Orientation Level: Place;Person;Situation Person: Oriented Place: Oriented Situation: Oriented Year: 2018 Month: November Day of Week: Correct Memory: Appears intact Immediate Memory Recall: Sock;Blue;Bed Memory Recall: Sock;Blue;Bed Memory Recall Sock: Without Cue Memory Recall Blue: Without Cue Memory Recall Bed: Without Cue Safety/Judgment: Appears intact Sensation Sensation Light Touch: Appears Intact Proprioception: Appears Intact Coordination Gross Motor Movements are Fluid and Coordinated: No Coordination and Movement Description: impaired d/t strength deficits Heel Shin Test: NT Motor  Motor Motor: Paraplegia Motor - Skilled Clinical Observations: paraparesis Mobility  Bed Mobility Bed Mobility: Rolling Right;Rolling Left;Supine to Sit Rolling Right: 4: Min assist;With rail Rolling Right Details: Verbal cues for technique Rolling Left: With rail;4: Min assist Rolling Left  Details: Verbal cues for technique Supine to Sit: 3: Mod assist;HOB flat;With rails Supine to Sit Details: Verbal cues for sequencing;Verbal cues for technique;Verbal cues for precautions/safety;Tactile cues for weight shifting;Tactile cues for sequencing;Tactile cues for placement;Tactile cues for posture Transfers Sit to Stand: 2: Max assist;From elevated surface;With upper extremity assist;Other/comment Sit to Stand Details (indicate cue type and reason): assist for initial boost into standing, then able to maintain balance min/modA for LLE stance control  Trunk/Postural Assessment  Cervical Assessment Cervical Assessment: Within Functional Limits Thoracic Assessment Thoracic Assessment: Within Functional Limits Lumbar Assessment Lumbar Assessment: Within Functional Limits Postural Control Postural Control: Deficits on evaluation  Balance Balance Balance Assessed: Yes Static Sitting Balance Static Sitting - Balance Support: Bilateral upper extremity supported;Feet supported Static Sitting - Level of Assistance: 5: Stand by assistance Dynamic Sitting Balance Dynamic Sitting - Balance Support: Feet supported;Right upper extremity supported Dynamic Sitting - Level of Assistance: 5: Stand by assistance;4: Min assist Dynamic Sitting Balance - Compensations: occasional posterior LOB with mobility activities in sitting Static Standing Balance Static Standing - Balance Support: Bilateral upper extremity supported Static Standing - Level of Assistance: 3: Mod assist Static Standing - Comment/# of Minutes: x10-20 sec with standard walker Dynamic Standing Balance Dynamic Standing - Balance Support: Bilateral upper extremity supported Dynamic Standing - Level of Assistance: 2: Max assist Dynamic Standing - Comments: during pre-gait in walker, gait with hall rail; limited by impaired BLE stance control Extremity/Trunk Assessment RUE Assessment RUE Assessment: Within Functional Limits LUE  Assessment LUE Assessment: Within Functional Limits   See Function Navigator for Current Functional Status.   Refer to Care Plan for Long Term Goals  Recommendations for other services: Neuropsych   Discharge Criteria: Patient will be discharged from OT if patient refuses treatment 3 consecutive times without medical reason, if treatment goals not met, if there is a change in medical status, if patient makes no progress towards goals or if patient is discharged from hospital.  The above assessment, treatment plan, treatment alternatives and goals were discussed and mutually agreed upon: by patient  Gypsy Decant 03/16/2017, 6:06 PM

## 2017-03-16 NOTE — Progress Notes (Signed)
[facility] PHYSICAL MEDICINE & REHABILITATION     PROGRESS NOTE    Subjective/Complaints: Had a fair night. Pain around a 6. Using medication generally around the clock. Had bm with suppository last night  ROS: pt denies nausea, vomiting, diarrhea, cough, shortness of breath or chest pain   Objective: Vital Signs: Blood pressure 135/87, pulse 65, temperature 98.9 F (37.2 C), temperature source Oral, resp. rate 18, height 5\' 7"  (1.702 m), weight 75 kg (165 lb 5.5 oz), SpO2 100 %. No results found. Recent Labs    03/16/17 0710  WBC 8.1  HGB 10.9*  HCT 35.1*  PLT 309   Recent Labs    03/16/17 0710  NA 137  K 4.1  CL 103  GLUCOSE 95  BUN 12  CREATININE 1.13  CALCIUM 8.6*   CBG (last 3)  No results for input(s): GLUCAP in the last 72 hours.  Wt Readings from Last 3 Encounters:  03/16/17 75 kg (165 lb 5.5 oz)  03/14/17 76.5 kg (168 lb 10.4 oz)  02/22/17 69.9 kg (154 lb)    Physical Exam:  Constitutional: He appearswell-developedand well-nourished.No distress.  HENT:  Head:Normocephalic.  Eyes:EOMare normal.  Neck:Normal range of motion.Neck supple.No thyromegalypresent.  Cardiovascular: RRR without murmur. No JVD  Respiratory:CTA Bilaterally without wheezes or rales. Normal effort .  WU:JWJXGI:Soft.Bowel sounds are normal. He exhibitsno distension.  Genitourinary:  Genitourinary: cath in place Skin. Warm and dry Neurological. Patient answers basic questions provides his name as well as date of birth. Follow simple commands. UE 5/5 bilaterally.Bilateral hip flexion is 1 to 1+ out of 5 knee extension 1 to 1+ out of 5 hip abduction and abduction are 1+ out of 5. Bilateral ankle dorsiflexion and plantar flexion are 2+out of 5.decr sensation below chest.  Psychiatric: flat, cooperative     Assessment/Plan: 1. 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 Physiatrist is providing close team supervision and 24 hour management of active medical problems listed below. Physiatrist and rehab team continue to assess barriers to discharge/monitor patient progress toward functional and medical goals.  Function:  Bathing Bathing position      Bathing parts      Bathing assist        Upper Body Dressing/Undressing Upper body dressing                    Upper body assist        Lower Body Dressing/Undressing Lower body dressing                                  Lower body assist        Toileting Toileting          Toileting assist     Transfers Chair/bed transfer             Locomotion Ambulation           Wheelchair          Cognition Comprehension    Expression    Social Interaction    Problem Solving    Memory     Medical Problem List and Plan: 1.Paraplegiasecondary to thoracic spinal cord infarction. Status post thrombectomy of left brachial artery, unsuccessful despite multiple attempts by vascular surgery 03/08/2017 as well as recent aortic arch replacement followed byTEVARprocedure 01/21/2017 -beginning therapies 2. DVT Prophylaxis/Anticoagulation: SCDs.  Check vascular study 3. Pain Management:Hydrocodone as needed  -discussed long acting medication to better support pain. Will add low dose ms contin to start 4. Mood:Provide emotional support 5. Neuropsych: This patientiscapable of making decisions on hisown behalf. 6. Skin/Wound Care:Routine skin checks 7. Fluids/Electrolytes/Nutrition:I personally reviewed the patient's labs today.  All WNL.  8.Hypertension. Cozaar 25 mg daily, Lopressor 12.5 mg twice a day. Monitor when out of bed and checking orthostatic blood pressures. 9.Neurogenic bowel and bladder. Appears to have some bowel control. Continue softeners/laxative   -foley out, voiding trial under way, avoid condom cath when  possible   -oob to void   LOS (Days) 1 A FACE TO FACE EVALUATION WAS PERFORMED  Ranelle OysterSWARTZ,Deziya Amero T, MD 03/16/2017 8:54 AM

## 2017-03-16 NOTE — Progress Notes (Signed)
Bilateral lower extremity venous duplex completed. No evidence of DVT, superficial thrombosis, or Baker's cyst.  Toma DeitersVirginia Aeisha Minarik, RVS 03/16/2017 1:37 PM

## 2017-03-16 NOTE — Progress Notes (Signed)
Social Work  Social Work Assessment and Plan  Patient Details  Name: Victor ChristmasMarcus E Belford MRN: [id number] Date of Birth: November 10, 1955  Today's Date: 03/18/2017  Problem List:  Patient Active Problem List   Diagnosis Date Noted  . Spinal cord infarction ([facility]) 03/15/2017  . Paraplegia ([facility]) 03/07/2017  . Bilateral leg weakness 03/07/2017  . S/P thoracic aortic aneurysm repair 01/21/2017  . Coronary artery calcification 12/23/2016  . S/P aortic dissection repair 12/23/2016  . Thoracic aortic aneurysm without rupture ([facility]) 12/23/2016  . Preoperative cardiovascular examination 12/23/2016  . Essential hypertension 09/23/2015  . Iron deficiency anemia 01/28/2015  . Weight loss 10/30/2014  . Hospital-acquired pneumonia 10/09/2014  . Ischemic leg 10/09/2014  . Aortic dissection ([facility]) 10/07/2014   Past Medical History:  Past Medical History:  Diagnosis Date  . History of kidney stones   . Hypertension   . Thoracic aortic aneurysm ([facility]) 09/2014   Past Surgical History:  Past Surgical History:  Procedure Laterality Date  . ASCENDING AORTIC ROOT REPLACEMENT N/A 01/21/2017   Procedure: Replacement of Aortic Arch with circulatory arrest;  Surgeon: Delight OvensGerhardt, Edward B, MD;  [facility];  Service: Open Heart Surgery;  Laterality: N/A;  . CARDIAC CATHETERIZATION    . FEMORAL-FEMORAL BYPASS GRAFT N/A 10/07/2014   Procedure: LEFT  FEMORAL ARTERY -RIGHT FEMORAL ARTERY BYPASS GRAFT USING 8MM X 30 CM HEMASHIELD GOLD GRAFT;  Surgeon: Larina Earthlyodd F Early, MD;  [facility];  Service: Vascular;  Laterality: N/A;  . RIGHT/LEFT HEART CATH AND CORONARY ANGIOGRAPHY N/A 01/11/2017   Procedure: RIGHT/LEFT HEART CATH AND CORONARY ANGIOGRAPHY- Diagnostic Only;  Surgeon: Tonny Bollmanooper, Michael, MD;  [facility];  Service: Cardiovascular;  Laterality: N/A;  . s/p thoracic aneurysm repair    . STERNOTOMY N/A 01/21/2017   Procedure: REDO STERNOTOMY;  Surgeon: Delight OvensGerhardt, Edward B, MD;  [facility];  Service:  Open Heart Surgery;  Laterality: N/A;  . TEE WITHOUT CARDIOVERSION N/A 01/21/2017   Procedure: TRANSESOPHAGEAL ECHOCARDIOGRAM (TEE);  Surgeon: Delight OvensGerhardt, Edward B, MD;  [facility];  Service: Open Heart Surgery;  Laterality: N/A;  . THORACIC AORTIC ANEURYSM REPAIR N/A 10/06/2014   Procedure: THORACIC ASCENDING ANEURYSM REPAIR (AAA);  Surgeon: Delight OvensEdward B Gerhardt, MD;  [facility];  Service: Open Heart Surgery;  Laterality: N/A;  hyportermia circulatory arrest and resuspension of aortic valve  . THORACIC AORTIC ENDOVASCULAR STENT GRAFT N/A 01/21/2017   Procedure: THORACIC AORTIC ENDOVASCULAR STENT GRAFT;  Surgeon: Nada LibmanBrabham, Vance W, MD;  [facility];  Service: Vascular;  Laterality: N/A;  . THROMBECTOMY ILIAC ARTERY Left 03/08/2017   Procedure: THROMBECTOMY of Left subclavian artery;  Surgeon: Chuck Hintickson, Christopher S, MD;  [facility];  Service: Vascular;  Laterality: Left;  Marland Kitchen. VASCULAR SURGERY     Social History:  reports that he quit smoking about 2 years ago. His smoking use included cigarettes and cigars. He quit after 35.00 years of use. he has never used smokeless tobacco. He reports that he does not drink alcohol or use drugs.  Family / Support Systems Marital Status: Single Patient Roles: Other (Comment)(has mom, dad, siblings and neices/ nephews) Other Supports: Mother, Lynann BeaverDora Coopersmith @ [phone](C) [phone];  sister, Allyne GeeKandra [name] ([place]) @ ([phone] Anticipated Caregiver: mom, dad, sisters, nieces/nephews Ability/Limitations of Caregiver: Mom works 3 nights a week 7 pm to 7 am; dad works as a Paediatric nursebarber.  Nieces and nephews can assist while mom is working. Caregiver Availability: 24/7 Family Dynamics: Pt lives with his parents who are both still  working.  Pt notes that his extended family will assist as needed when parents are unavailable.  Very supportive family.  Social History Preferred language: English Religion: Baptist Cultural Background: NA Read: Yes Write:  Yes Employment Status: Employed Name of Employer: self-employed Paediatric nursebarber Return to Work Plans: Pt uncertain about return dependent on how much independence he can regain. Legal Hisotry/Current Legal Issues: None Guardian/Conservator: None - per MD, pt is capable of making decisions on his own behalf.   Abuse/Neglect Abuse/Neglect Assessment Can Be Completed: Yes Physical Abuse: Denies Verbal Abuse: Denies Sexual Abuse: Denies Exploitation of patient/patient's resources: Denies Self-Neglect: Denies  Emotional Status Pt's affect, behavior adn adjustment status: Pt pleasant and completes assessment interview without much difficulty.  Does not make much eye contact.  Denies any significant emotional distress, however, does question his ability to walk again.  Have referred for neuropsychology consult for additional support while here. Recent Psychosocial Issues: Recent cardaic surgeries but had returned to work soon after. Pyschiatric History: None Substance Abuse History: None  Patient / Family Perceptions, Expectations & Goals Pt/Family understanding of illness & functional limitations: Pt and mother with general understanding that he suffered a spinal cord infarct.  Pt notes that "doctors have told me that might happen with the other surgeries I've had."  Good understanding of the resulting functional limitations/ need for CIR. Premorbid pt/family roles/activities: Pt was completely independent Anticipated changes in roles/activities/participation: Changes are dependent on overall recovery.  Parents and other family may need to assume some caregiver duties. Pt/family expectations/goals: Pt is hopeful he will be able to walk again.  Community Resources [employer] Stra[employer]Community Agencies: None Premorbid Home Care/DME Agencies: None Transportation available at discharge: yes Resource referrals recommended: Neuropsychology  Discharge Planning Living Arrangements: Parent Support Systems: Parent, Other  relatives Type of Residence: Private residence Insurance Resources: Customer service managerelf-pay Financial Resources: Family Support Financial Screen Referred: Previously completed(SSD and Medicaid applications have been started) Living Expenses: Lives with family Money Management: Patient Does the patient have any problems obtaining your medications?: No Home Management: Pt and parents Patient/Family Preliminary Plans: Pt to return home with parents and other family assisting as needed. Social Work Anticipated Follow Up Needs: HH/OP Expected length of stay: 20-25 days  Clinical Impression Pleasant gentleman here following a spinal cord infarct.  He is hopeful he will be able to regain ability to walk but is concerned about this.  Good family support as he lives with elderly parents, however, both still working.  Have referred for neuropsychology to follow for additional support.  Will follow for support and d/c planning needs.  Alexus Michael 03/18/2017, 4:21 PM

## 2017-03-17 DIAGNOSIS — R509 Fever, unspecified: Secondary | ICD-10-CM

## 2017-03-17 LAB — URINALYSIS, COMPLETE (UACMP) WITH MICROSCOPIC
Bilirubin Urine: NEGATIVE
Glucose, UA: NEGATIVE mg/dL
Ketones, ur: NEGATIVE mg/dL
NITRITE: NEGATIVE
PROTEIN: 30 mg/dL — AB
Specific Gravity, Urine: 1.027 (ref 1.005–1.030)
pH: 5 (ref 5.0–8.0)

## 2017-03-17 NOTE — Plan of Care (Signed)
Pain controlled Incision healing Continent of bowel and bladder

## 2017-03-17 NOTE — Progress Notes (Signed)
[facility] PHYSICAL MEDICINE & REHABILITATION     PROGRESS NOTE    Subjective/Complaints: Had a reasonable night.  Tells me that therapy went well yesterday.  Satisfied with pain control  ROS: pt denies nausea, vomiting, diarrhea, cough, shortness of breath or chest pain   Objective: Vital Signs: Blood pressure (!) 104/53, pulse (!) 109, temperature (!) 101.1 F (38.4 C), temperature source Oral, resp. rate 19, height 5\' 7"  (1.702 m), weight 74.8 kg (165 lb), SpO2 98 %. No results found. Recent Labs    03/16/17 0710  WBC 8.1  HGB 10.9*  HCT 35.1*  PLT 309   Recent Labs    03/16/17 0710  NA 137  K 4.1  CL 103  GLUCOSE 95  BUN 12  CREATININE 1.13  CALCIUM 8.6*   CBG (last 3)  No results for input(s): GLUCAP in the last 72 hours.  Wt Readings from Last 3 Encounters:  03/17/17 74.8 kg (165 lb)  03/14/17 76.5 kg (168 lb 10.4 oz)  02/22/17 69.9 kg (154 lb)    Physical Exam:  Constitutional: He appearswell-developedand well-nourished.No distress.  HENT:  Head:Normocephalic.  Eyes:EOMare normal.  Neck:Normal range of motion.Neck supple.No thyromegalypresent.  Cardiovascular: Tachycardic, no JVD Respiratory:Normal effort, lungs clear.  ZO:XWRUGI:Soft.Bowel sounds are normal. He exhibitsno distension.  Genitourinary:  Skin. Warm and dry and intact Neurological. Patient answers basic questions provides his name as well as date of birth. Follow simple commands. UE 5/5 bilaterally.Bilateral hip flexion is 1 to 1+ out of 5 knee extension 1 to 1+ out of 5 hip abduction and abduction are 1+ out of 5. Bilateral ankle dorsiflexion and plantar flexion are 2+out of 5.decr sensation below chest.  Motor and sensory exam unchanged Psychiatric: flat, cooperative     Assessment/Plan: 1. 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 Physiatrist is  providing close team supervision and 24 hour management of active medical problems listed below. Physiatrist and rehab team continue to assess barriers to discharge/monitor patient progress toward functional and medical goals.  Function:  Bathing Bathing position Bathing activity did not occur: Refused    Bathing parts      Bathing assist        Upper Body Dressing/Undressing Upper body dressing Upper body dressing/undressing activity did not occur: Refused                  Upper body assist        Lower Body Dressing/Undressing Lower body dressing Lower body dressing/undressing activity did not occur: Refused                                Lower body assist        Toileting Toileting Toileting activity did not occur: No continent bowel/bladder event        Toileting assist     Transfers Chair/bed transfer   Chair/bed transfer method: Lateral scoot Chair/bed transfer assist level: 2 helpers(Due to dizziness/HA after prolonged sitting.) Chair/bed transfer assistive device: Armrests, Sliding board     Locomotion Ambulation     Max distance: 5' Assist level: 2 helpers   Wheelchair   Type: Manual Max wheelchair distance: 150 Assist Level: Supervision or verbal cues  Cognition Comprehension Comprehension assist level: Follows complex conversation/direction with extra time/assistive device  Expression Expression assist level: Expresses complex ideas: With extra time/assistive device  Social  Interaction Social Interaction assist level: Interacts appropriately with others with medication or extra time (anti-anxiety, antidepressant).  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 Status post thrombectomy of left brachial artery,  unsuccessful despite multiple attempts by vascular surgery 03/08/2017 as well as recent aortic arch replacement followed byTEVARprocedure 01/21/2017 -Continue inpatient rehab therapies 2. DVT Prophylaxis/Anticoagulation: SCDs.  Dopplers performed yesterday and negative 3. Pain Management:Hydrocodone as needed  -Added MS Contin 15 mg every 12 hours with good results thus far  4. Mood:Provide emotional support 5. Neuropsych: This patientiscapable of making decisions on hisown behalf. 6. Skin/Wound Care:Routine skin checks 7. Fluids/Electrolytes/Nutrition:I personally reviewed the patient's labs today.  All WNL.  8.Hypertension. Cozaar 25 mg daily, Lopressor 12.5 mg twice a day.     -Increase Lopressor to 25 mg twice daily to better control blood pressure and heart rate  9.Neurogenic bowel and bladder. Appears to have some bowel control. Continue softeners/laxative   -foley out, voiding trial under way, avoid condom cath when possible   -oob to void 10. Fever: No obvious respiratory or wound related issues at present.   - We will check urinalysis and culture   LOS (Days) 2 A FACE TO FACE EVALUATION WAS PERFORMED  Ranelle OysterSWARTZ,ZACHARY T, MD 03/17/2017 8:59 AM

## 2017-03-18 ENCOUNTER — Inpatient Hospital Stay (HOSPITAL_COMMUNITY): Payer: Self-pay

## 2017-03-18 ENCOUNTER — Inpatient Hospital Stay (HOSPITAL_COMMUNITY): Payer: Self-pay | Admitting: Physical Therapy

## 2017-03-18 MED ORDER — AMOXICILLIN 250 MG PO CAPS
250.0000 mg | ORAL_CAPSULE | Freq: Three times a day (TID) | ORAL | Status: AC
  Administered 2017-03-18 – 2017-03-22 (×15): 250 mg via ORAL
  Filled 2017-03-18 (×14): qty 1

## 2017-03-18 NOTE — Plan of Care (Signed)
Pain controlled with current plan of care  Taking hydrocodone prn with relief Using urinal

## 2017-03-18 NOTE — Plan of Care (Signed)
  Progressing Consults RH SPINAL CORD INJURY PATIENT EDUCATION Description  See Patient Education module for education specifics.  03/18/2017 1751 - Progressing by Melina ModenaBurchett, Chryl Holten, RN SCI BOWEL ELIMINATION RH STG MANAGE BOWEL WITH ASSISTANCE Description STG Manage Bowel with mod I Assistance.  03/18/2017 1751 - Progressing by Melina ModenaBurchett, Keigen Caddell, RN RH STG SCI MANAGE BOWEL WITH MEDICATION WITH ASSISTANCE Description STG SCI Manage bowel with medication with mod I assistance.  03/18/2017 1751 - Progressing by Melina ModenaBurchett, Anaijah Augsburger, RN RH STG SCI MANAGE BOWEL PROGRAM W/ASSIST OR AS APPROPRIATE Description STG SCI Manage bowel program w/assist or as mod I appropriate.  03/18/2017 1751 - Progressing by Melina ModenaBurchett, Surie Suchocki, RN SCI BLADDER ELIMINATION RH STG MANAGE BLADDER WITH ASSISTANCE Description STG Manage Bladder With  Min assist Assistance  03/18/2017 1751 - Progressing by Melina ModenaBurchett, Tristan Bramble, RN RH SAFETY RH STG ADHERE TO SAFETY PRECAUTIONS W/ASSISTANCE/DEVICE Description STG Adhere to Safety Precautions With  Cues/ reminders Assistance/Device.  03/18/2017 1751 - Progressing by Melina ModenaBurchett, Payton Moder, RN RH PAIN MANAGEMENT RH STG PAIN MANAGED AT OR BELOW PT'S PAIN GOAL Description Pain at or below level 5 with prn medications  03/18/2017 1751 - Progressing by Melina ModenaBurchett, Loura Pitt, RN RH KNOWLEDGE DEFICIT SCI RH STG INCREASE KNOWLEDGE OF SELF CARE AFTER SCI Description Patient will be able to coordinate care and guide family in how to take care of him and address needs at discharge using cues/reminders  03/18/2017 1751 - Progressing by Melina ModenaBurchett, Bevin Mayall, RN

## 2017-03-18 NOTE — Progress Notes (Signed)
Occupational Therapy Note  Patient Details  Name: Victor [name] MRN: [id number] Date of Birth: 02/07/1956  Today's Date: 03/18/2017 OT Individual Time: 1330-1400 OT Individual Time Calculation (min): 30 min   Pt denies pain Individual therapy  Pt resting in w/c upon arrival with mother present.  Focus on w/c mobility, sit<>stand with [place], standing balance in [place], and discharge planning.  Pt requires min A for w/c mobility.  Pt performed sit<>stand with Stedy with min A and remained standing with BUE support with steady A.  Pt able to use one UE for functional task while standing in [place].  Pt transferred with Stedy to w/c with elevating leg rests.  Discussed bathing options and demonstrated tub transfer bench.  Pt's mother issued home measurement sheet.  Discussed w/c options.  Pt remained in w/c with mother present.    [name], Terel Bann [facility] SystemChappell 03/18/2017, 2:37 PM

## 2017-03-18 NOTE — IPOC Note (Signed)
Overall Plan of Care [facility](IPOC) Patient Details Name: Victor [name] MRN: [id number] DOB: 1956/03/31  Admitting Diagnosis: <principal problem not specified> thoracic SCI  Hospital Problems: Active Problems:   Paraplegia (HCC)   Spinal cord infarction [facility](HCC)     Functional Problem List: Nursing Bladder, Bowel, Pain, Endurance, Safety, Medication Management  PT Balance, Skin Integrity, Endurance, Motor, Pain, Safety  OT Balance, Endurance, Pain, Safety  SLP    TR         Basic ADL's: OT Grooming, Bathing, Dressing, Toileting     Advanced  ADL's: OT       Transfers: PT Bed to Chair, Car, Furniture, Cablevision SystemsBed Mobility  OT Toilet, Research scientist (life sciences)Tub/Shower     Locomotion: PT Ambulation, Wheelchair Mobility     Additional Impairments: OT None  SLP        TR      Anticipated Outcomes Item Anticipated Outcome  Self Feeding n/a  Swallowing      Basic self-care  mod I - min A  Toileting  Supervision   Bathroom Transfers Supervision toilet , min A shower  Bowel/Bladder  MANAGE B+b W MOD 1 ASSIST  Transfers  S   Locomotion  modI w/c management, modA gait in controlled environment  Communication     Cognition     Pain  pAIN AT OR BELOW LEVEL 5 W PRN MED  Safety/Judgment  mAINTAIN  SAFETY W SUPERVISION/CUES   Therapy Plan: PT Intensity: Minimum of 1-2 x/day ,45 to 90 minutes PT Frequency: 5 out of 7 days PT Duration Estimated Length of Stay: 21-24 days OT Intensity: Minimum of 1-2 x/day, 45 to 90 minutes OT Frequency: 5 out of 7 days OT Duration/Estimated Length of Stay: 3 weeks      Team Interventions: Nursing Interventions Patient/Family Education, Disease Management/Prevention, Skin Care/Wound Management, Discharge Planning, Bowel Management, Pain Management, Medication Management  PT interventions Ambulation/gait training, Discharge planning, Functional mobility training, Psychosocial support, Therapeutic Activities, Wheelchair propulsion/positioning, Therapeutic Exercise,  Skin care/wound management, Neuromuscular re-education, Disease management/prevention, Warden/rangerBalance/vestibular training, DME/adaptive equipment instruction, Pain management, Splinting/orthotics, UE/LE Strength taining/ROM, UE/LE Coordination activities, Patient/family education, Functional electrical stimulation, Community reintegration  OT Interventions Warden/rangerBalance/vestibular training, Neuromuscular re-education, Self Care/advanced ADL retraining, Therapeutic Exercise, Wheelchair propulsion/positioning, DME/adaptive equipment instruction, Pain management, Skin care/wound managment, UE/LE Strength taining/ROM, Community reintegration, Equities traderatient/family education, UE/LE Coordination activities, Discharge planning, Functional mobility training, Psychosocial support, Therapeutic Activities  SLP Interventions    TR Interventions    SW/CM Interventions Discharge Planning, Psychosocial Support, Patient/Family Education   Barriers to Discharge MD  Medical stability and Neurogenic bowel and bladder  Nursing      PT Inaccessible home environment, Lack of/limited family support 6 STE home, aging parents  OT Lack of/limited family support 24/7 ??  SLP      SW      2 Team Discharge Planning: Destination: PT-Home ,OT- Home , SLP-  Projected Follow-up: PT-Outpatient PT, OT-  24 hour supervision/assistance, Home health OT, SLP-  Projected Equipment Needs: PT-To be determined, OT- To be determined, SLP-  Equipment Details: PT-has no equipment, will likely need w/c, OT-  Patient/family involved in discharge planning: PT- Patient,  OT-Patient, SLP-   MD ELOS: 21-24 days Medical Rehab Prognosis:  Excellent Assessment: The patient has been admitted for CIR therapies with the diagnosis of thoracic SCI with paraplegia. The team will be addressing functional mobility, strength, stamina, balance, safety, adaptive techniques and equipment, self-care, bowel and bladder mgt, patient and caregiver education, NMR, pain mgt, w/c  assesment, orthotics, ego  support. Goals have been set at mod I to supervision/min assist with self-care and ADL's and supervision transfers and mod I w/c mobility.    Ranelle OysterZachary T. Swartz, MD, FAAPMR      See Team Conference Notes for weekly updates to the plan of care

## 2017-03-18 NOTE — Progress Notes (Signed)
[place] PHYSICAL MEDICINE & REHABILITATION     PROGRESS NOTE    Subjective/Complaints: Has had a few chills.  Low-grade temperature again this morning.  Denies any coughing or shortness of breath  ROS: pt denies nausea, vomiting, diarrhea, cough, shortness of breath or chest pain   Objective: Vital Signs: Blood pressure 115/61, pulse 99, temperature 98.4 F (36.9 C), temperature source Oral, resp. rate 18, height 5\' 7"  (1.702 m), weight 74.9 kg (165 lb 3.2 oz), SpO2 100 %. No results found. Recent Labs    03/16/17 0710  WBC 8.1  HGB 10.9*  HCT 35.1*  PLT 309   Recent Labs    03/16/17 0710  NA 137  K 4.1  CL 103  GLUCOSE 95  BUN 12  CREATININE 1.13  CALCIUM 8.6*   CBG (last 3)  No results for input(s): GLUCAP in the last 72 hours.  Wt Readings from Last 3 Encounters:  03/18/17 74.9 kg (165 lb 3.2 oz)  03/14/17 76.5 kg (168 lb 10.4 oz)  02/22/17 69.9 kg (154 lb)    Physical Exam:  Constitutional: He appearswell-developedand well-nourished.No distress.  HENT:  Head:Normocephalic.  Eyes:EOMare normal.  Neck:Normal range of motion.Neck supple.No thyromegalypresent.  Cardiovascular: Regular rate without JVD Respiratory:CTA Bilaterally without wheezes or rales. Normal effort   ZO:XWRUGI:Soft.Bowel sounds are normal. He exhibitsno distension.  Genitourinary:  Skin.  Normal temperature, no signs of breakdown  neurological. Patient answers basic questions provides his name as well as date of birth. Follow simple commands. UE 5/5 bilaterally.Bilateral hip flexion is 1 to 1+ out of 5 knee extension 1 to 1+ out of 5 hip abduction and abduction are 1+ out of 5. Bilateral ankle dorsiflexion and plantar flexion are 2+out of 5.decr sensation below chest.   Motor and sensory exam unchanged today Psychiatric: Pleasant and cooperative     Assessment/Plan: 1. 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 Physiatrist is providing close team supervision and 24 hour management of active medical problems listed below. Physiatrist and rehab team continue to assess barriers to discharge/monitor patient progress toward functional and medical goals.  Function:  Bathing Bathing position Bathing activity did not occur: Refused    Bathing parts      Bathing assist        Upper Body Dressing/Undressing Upper body dressing Upper body dressing/undressing activity did not occur: Refused                  Upper body assist        Lower Body Dressing/Undressing Lower body dressing Lower body dressing/undressing activity did not occur: Refused                                Lower body assist        Toileting Toileting Toileting activity did not occur: No continent bowel/bladder event        Toileting assist     Transfers Chair/bed transfer   Chair/bed transfer method: Lateral scoot Chair/bed transfer assist level: 2 helpers(Due to dizziness/HA after prolonged sitting.) Chair/bed transfer assistive device: Armrests, Sliding board     Locomotion Ambulation     Max distance: 5' Assist level: 2 helpers   Wheelchair   Type: Manual Max wheelchair distance: 150 Assist Level: Supervision or verbal cues  Cognition Comprehension Comprehension assist level: Follows complex conversation/direction with extra time/assistive  device  Expression Expression assist level: Expresses complex ideas: With extra time/assistive device  Social Interaction Social Interaction assist level: Interacts appropriately with others with medication or extra time (anti-anxiety, antidepressant).  Problem Solving Problem solving assist level: Solves basic 90% of the time/requires cueing < 10% of the time  Memory Memory assist level: More than reasonable amount of time   Medical Problem List and Plan: 1.Paraplegiasecondary  to thoracic spinal cord infarction. Status post thrombectomy of left brachial artery, unsuccessful despite multiple attempts by vascular surgery 03/08/2017 as well as recent aortic arch replacement followed byTEVARprocedure 01/21/2017 -Continue inpatient rehab therapies 2. DVT Prophylaxis/Anticoagulation: SCDs.  Dopplers performed yesterday and negative 3. Pain Management:Hydrocodone as needed  -continue MS Contin 15 mg every 12 hours with good results thus far  4. Mood:Provide emotional support 5. Neuropsych: This patientiscapable of making decisions on hisown behalf. 6. Skin/Wound Care:Routine skin checks 7. Fluids/Electrolytes/Nutrition:I personally reviewed the patient's labs today.  All WNL.  8.Hypertension.   Lopressor 12.5 mg twice a day.     -keep lopressor at the same dose, hold Cozaar 25 mg 9.Neurogenic bowel and bladder. Appears to have some bowel control. Continue softeners/laxative   -foley out, voiding trial under way, avoid condom cath when possible   -oob to void 10. Fever: No obvious respiratory or wound related issues at present.   -Urinalysis is suspicious.  Begin empiric amoxicillin 250 mg every 8 hours   LOS (Days) 3 A FACE TO FACE EVALUATION WAS PERFORMED  Ranelle OysterSWARTZ,Samantha Ragen T, MD 03/18/2017 9:24 AM

## 2017-03-18 NOTE — Care Management (Signed)
Inpatient Rehabilitation Center Individual Statement of Services  Patient Name:  Victor [name]  Date:  03/18/2017  Welcome to the Inpatient Rehabilitation Center.  Our goal is to provide you with an individualized program based on your diagnosis and situation, designed to meet your specific needs.  With this comprehensive rehabilitation program, you will be expected to participate in at least 3 hours of rehabilitation therapies Monday-Friday, with modified therapy programming on the weekends.  Your rehabilitation program will include the following services:  Physical Therapy (PT), Occupational Therapy (OT), 24 hour per day rehabilitation nursing, Therapeutic Recreaction (TR), Neuropsychology, Case Management (Social Worker), Rehabilitation Medicine, Nutrition Services and Pharmacy Services  Weekly team conferences will be held on Tuesdays to discuss your progress.  Your Social Worker will talk with you frequently to get your input and to update you on team discussions.  Team conferences with you and your family in attendance may also be held.  Expected length of stay: 21-24 days    Overall anticipated outcome: minimal assistance  Depending on your progress and recovery, your program may change. Your Social Worker will coordinate services and will keep you informed of any changes. Your Social Worker's name and contact numbers are listed  below.  The following services may also be recommended but are not provided by the Inpatient Rehabilitation Center:   Driving Evaluations  Home Health Rehabiltiation Services  Outpatient Rehabilitation Services  Vocational Rehabilitation   Arrangements will be made to provide these services after discharge if needed.  Arrangements include referral to agencies that provide these services.  Your insurance has been verified to be:  None (but Medicaid and Disability applications have been started. Your primary doctor is:  [name]  Pertinent information  will be shared with your doctor and your insurance company.  Social Worker:  [place]cy [place], [name] or ([phone]) [phone]   Information discussed with and copy given to patient by: Amada JupiterHOYLE, [name], 03/18/2017, 4:25 PM

## 2017-03-18 NOTE — Progress Notes (Signed)
Physical Therapy Session Note  Patient Details  Name: Victor [name] MRN: [id number] Date of Birth: [birth year]  Today's Date: 03/18/2017 PT Individual Time: 0930-1000 and 1100-1130 PT Individual Time Calculation (min): 30 min and 30 min  (total 60 min)   Short Term Goals: Week 1:  PT Short Term Goal 1 (Week 1): Pt will stay OOB >4 hours/day outside of regular therapy sessions PT Short Term Goal 2 (Week 1): Pt will perform bed mobility minA PT Short Term Goal 3 (Week 1): Pt will perform transfers w/c <>bed minA PT Short Term Goal 4 (Week 1): Pt will perform gait x10' maxA  Skilled Therapeutic Interventions/Progress Updates: Tx 1: Pt received seated in w/c, denies pain and agreeable to treatment. Thigh-high teds donned totalA. Pt dons B non-skid socks with minA; attempted crossing foot over opposite knee however unable d/t limited hip ROM and discomfort. Ultimately performed with foot propped on sideways trashcan as stool, increased time required. W/c propulsion within unit 75-100' at a time before fatigued and requiring rest break. Discussed plan of care, anticipated length of stay, and reviewed S w/c level goals with one goal currently to ambulate with therapy, which may be adjusted depending on progress. Transfer w/c <>mat table minA squat pivot. Therapist adjusted L w/c brake for safety. Returned to room with w/c propulsion as above.   Tx 2: Pt received seated in w/c at sink with handoff from OT. Pt uses urinal with setupA to obtain/empty. Pt threads pants with modA; sit <>stand in stedy with min guard, modA for pulling up pants as pt unable to pull up on L side. Squat pivot transfer w/c <>mat table minA. Sit <>stand, standing weight shifts and stepping in place with maxA to boost to stand, min guard at L knee for stance control during pre-gait activities. Returned to room with w/c propulsion S; remained seated in w/c with mother present, all needs in reach.      Therapy  Documentation Precautions:  Precautions Precautions: Fall Precaution Comments: paraparesis Restrictions Weight Bearing Restrictions: No Pain: Pain Assessment Pain Score: Asleep   See Function Navigator for Current Functional Status.   Therapy/Group: Individual Therapy  Vista Lawmanlizabeth J Tygielski 03/18/2017, 10:21 AM

## 2017-03-18 NOTE — Progress Notes (Signed)
Occupational Therapy Session Note  Patient Details  Name: Victor [name] MRN: [id number] Date of Birth: 06-11-1955  Today's Date: 03/18/2017 OT Individual Time: 1000-1100 OT Individual Time Calculation (min): 60 min    Short Term Goals: Week 1:  OT Short Term Goal 1 (Week 1): Pt will perform toilet transfer with mod A. OT Short Term Goal 2 (Week 1): Pt will perform clothing management after toileting with min A for balance. OT Short Term Goal 3 (Week 1): Pt will perform dynamic functional tasks while seated on EOB with min A for balance.   Skilled Therapeutic Interventions/Progress Updates:    Pt resting in w/c upon arrival with hospital gown donned. Reviewed OT LTGs and purpose of therapy.  Pt verbalized understanding.  Pt engaged in UB bathing/dressing tasks seated in w/c at sink.  Pt wanted to shave and engaged in shaving tasks (longer than anticipated).  Pt c/o RUE numbness (fingers and lower arm). Pt stated that only his fingers used to be numb.  UB bathing/dressing and grooming tasks took longer than anticipated.  PT entered room and continued with BADLs.   Therapy Documentation Precautions:  Precautions Precautions: Fall Precaution Comments: paraparesis Restrictions Weight Bearing Restrictions: No General:   Vital Signs: Therapy Vitals BP: 93/62 Patient Position (if appropriate): Sitting Pain: Pt c/o sensitivity     See Function Navigator for Current Functional Status.   Therapy/Group: Individual Therapy  Rich BraveLanier, Matheson Vandehei Chappell 03/18/2017, 12:05 PM

## 2017-03-19 ENCOUNTER — Inpatient Hospital Stay (HOSPITAL_COMMUNITY): Payer: Self-pay | Admitting: Physical Therapy

## 2017-03-19 ENCOUNTER — Inpatient Hospital Stay (HOSPITAL_COMMUNITY): Payer: Self-pay

## 2017-03-19 LAB — URINE CULTURE

## 2017-03-19 NOTE — Progress Notes (Signed)
Occupational Therapy Session Note  Patient Details  Name: Victor ChristmasMarcus E Riden MRN: [id number] Date of Birth: [birth year]  Today's Date: 03/19/2017 OT Individual Time: 0700-0757 OT Individual Time Calculation (min): 57 min    Short Term Goals: Week 1:  OT Short Term Goal 1 (Week 1): Pt will perform toilet transfer with mod A. OT Short Term Goal 2 (Week 1): Pt will perform clothing management after toileting with min A for balance. OT Short Term Goal 3 (Week 1): Pt will perform dynamic functional tasks while seated on EOB with min A for balance.   Skilled Therapeutic Interventions/Progress Updates:    Pt resting in bed upon arrival.  Pt required assistance moving LLE off edge of bed and min a for sidelying to sitting at EOB.  Pt maintained static sitting balance EOB at supervision level.  Pt transferred to w/c with Stedy to engage in BADL retraining including bathing/dressing at w/c level.  Pt used Stedy for standing at sink to pull up pants.  Pt requires min A for sit<>stand in [place] with increased BUE use for task.  Pt required tot A for LB dressing tasks.  Pt remained in w/c with all needs within reach.  Focus on bed mobility, activity tolerance, sit<>stand, sitting balance, and standing balance to increase independence with BADLs.   Therapy Documentation Precautions:  Precautions Precautions: Fall Precaution Comments: paraparesis Restrictions Weight Bearing Restrictions: No   Pain: Pain Assessment Pain Assessment: No/denies pain Pain Score: 0-No pain Pain Type: Acute pain Pain Location: Head Pain Orientation: Anterior Pain Descriptors / Indicators: Headache Pain Frequency: Rarely Pain Onset: Gradual Patients Stated Pain Goal: 0 Pain Intervention(s): Medication (See eMAR);Repositioned  See Function Navigator for Current Functional Status.   Therapy/Group: Individual Therapy  Rich BraveLanier, Kinga Cassar Chappell 03/19/2017, 7:59 AM

## 2017-03-19 NOTE — Progress Notes (Signed)
[name] PHYSICAL MEDICINE & REHABILITATION     PROGRESS NOTE    Subjective/Complaints: Denies fever or chills.  Denies any urinary symptoms.  Pain seems controlled.  Getting started with physical therapy  ROS: pt denies nausea, vomiting, diarrhea, cough, shortness of breath or chest pain    Objective: Vital Signs: Blood pressure 126/72, pulse 62, temperature 97.6 F (36.4 C), temperature source Oral, resp. rate 18, height 5\' 7"  (1.702 m), weight 74.9 kg (165 lb 3.2 oz), SpO2 100 %. No results found. No results for input(s): WBC, HGB, HCT, PLT in the last 72 hours. No results for input(s): NA, K, CL, GLUCOSE, BUN, CREATININE, CALCIUM in the last 72 hours.  Invalid input(s): CO CBG (last 3)  No results for input(s): GLUCAP in the last 72 hours.  Wt Readings from Last 3 Encounters:  03/18/17 74.9 kg (165 lb 3.2 oz)  03/14/17 76.5 kg (168 lb 10.4 oz)  02/22/17 69.9 kg (154 lb)    Physical Exam:  Constitutional: He appearswell-developedand well-nourished.No distress.  HENT:  Head:Normocephalic.  Eyes:EOMare normal.  Neck:Normal range of motion.Neck supple.No thyromegalypresent.  Cardiovascular:RRR without murmur. No JVD  Respiratory:CTA Bilaterally without wheezes or rales. Normal effort    ZO:XWRUGI:Soft.Bowel sounds are normal. He exhibitsno distension.  Genitourinary:  Skin.   Skin intact.  Normal skin temperature neurological. Patient answers basic questions provides his name as well as date of birth. Follow simple commands. UE 5/5 bilaterally.Bilateral hip flexion is 1 to 1+ out of 5 knee extension 1 to 1+ out of 5 hip abduction and abduction are 1+ out of 5. Bilateral ankle dorsiflexion and plantar flexion are 2+out of 5.decr sensation below chest.   Motor and sensory exam are stable  psychiatric: Pleasant and cooperative     Assessment/Plan: 1. 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 Physiatrist is providing close team supervision and 24 hour management of active medical problems listed below. Physiatrist and rehab team continue to assess barriers to discharge/monitor patient progress toward functional and medical goals.  Function:  Bathing Bathing position Bathing activity did not occur: (UB only )    Bathing parts Body parts bathed by patient: Right arm, Left arm, Chest, Abdomen Body parts bathed by helper: Back  Bathing assist        Upper Body Dressing/Undressing Upper body dressing Upper body dressing/undressing activity did not occur: Refused What is the patient wearing?: Pull over shirt/dress     Pull over shirt/dress - Perfomed by patient: Thread/unthread right sleeve, Thread/unthread left sleeve, Put head through opening, Pull shirt over trunk          Upper body assist Assist Level: Supervision or verbal cues      Lower Body Dressing/Undressing Lower body dressing Lower body dressing/undressing activity did not occur: N/A                                Lower body assist        Toileting Toileting Toileting activity did not occur: No continent bowel/bladder event        Toileting assist     Transfers Chair/bed transfer   Chair/bed transfer method: Lateral scoot Chair/bed transfer assist level: 2 helpers(Due to dizziness/HA after prolonged sitting.) Chair/bed transfer assistive device: Armrests, Sliding board     Locomotion Ambulation     Max distance: 5' Assist level: 2  helpers   Wheelchair   Type: Manual Max wheelchair distance: 150 Assist Level: Supervision or verbal cues  Cognition Comprehension Comprehension assist level: Follows complex conversation/direction with extra time/assistive device  Expression Expression assist level: Expresses complex ideas: With extra time/assistive device  Social Interaction Social Interaction assist level: Interacts  appropriately with others with medication or extra time (anti-anxiety, antidepressant).  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 DVT Prophylaxis/Anticoagulation: SCDs.  Dopplers performed yesterday and negative 3. Pain Management:Hydrocodone as needed  -continue MS Contin 15 mg every 12 hours with good results thus far  4. Mood:Provide emotional support 5. Neuropsych: This patientiscapable of making decisions on hisown behalf. 6. Skin/Wound Care:Routine skin checks 7. Fluids/Electrolytes/Nutrition:I personally reviewed the patient's labs today.  All WNL.  8.Hypertension.   Lopressor 12.5 mg twice a day.     -keep lopressor at the same dose, held Cozaar 25 mg 9.Neurogenic bowel and bladder. Appears to have some bowel control. Continue softeners/laxative   -foley out, voiding trial under way, avoid condom cath when possible   -oob to void 10. Fever: No obvious respiratory or wound related issues at present.   -Urinalysis is suspicious.   Continue empiric amoxicillin 250 mg every 8 hours as urine culture is still pending   LOS (Days) 4 A FACE TO FACE EVALUATION WAS PERFORMED  Victor [name],Victor T, MD 03/19/2017 8:21 AM

## 2017-03-19 NOTE — Progress Notes (Signed)
Occupational Therapy Note  Patient Details  Name: Victor [name] MRN: [id number] Date of Birth: [birth year]  Today's Date: 03/19/2017 OT Individual Time: 1100-1200 OT Individual Time Calculation (min): 60 min   Pt c/o HA unrated; RN aware Individual Therapy  Pt resting in recliner upon arrival.  Focus on sit<>stand with Stedy, standing balance with Stedy, and w/c mobility.  Pt performs sit<>stand with min A and maintains standing balance with steady A and anterior lean against bar on Stedy.  Pt relies upon BUE strength for sit<>stand and standing balance.  Pt able to maintain standing balance for approx 60 seconds before report of back pain.  Pt engaged in w/c mobility, navigating around obstacles, in<>out of doorways, and straight in hallways.  Pt with difficulty maintaining straight path in hallway.  Pt returned to recliner with all needs within reach.    Victor [name], Victor [name] [facility] Center[facility]appell 03/19/2017, 12:03 PM

## 2017-03-19 NOTE — Plan of Care (Signed)
  Progressing Consults RH SPINAL CORD INJURY PATIENT EDUCATION Description  See Patient Education module for education specifics.  03/19/2017 1744 - Progressing by Melina ModenaBurchett, Birney Belshe, RN SCI BOWEL ELIMINATION RH STG MANAGE BOWEL WITH ASSISTANCE Description STG Manage Bowel with mod I Assistance.  03/19/2017 1744 - Progressing by Melina ModenaBurchett, Kenyada Hy, RN RH STG SCI MANAGE BOWEL WITH MEDICATION WITH ASSISTANCE Description STG SCI Manage bowel with medication with mod I assistance.  03/19/2017 1744 - Progressing by Melina ModenaBurchett, Kentarius Partington, RN RH STG SCI MANAGE BOWEL PROGRAM W/ASSIST OR AS APPROPRIATE Description STG SCI Manage bowel program w/assist or as mod I appropriate.  03/19/2017 1744 - Progressing by Melina ModenaBurchett, Santana Gosdin, RN SCI BLADDER ELIMINATION RH STG MANAGE BLADDER WITH ASSISTANCE Description STG Manage Bladder With  Min assist Assistance  03/19/2017 1744 - Progressing by Melina ModenaBurchett, Tabitha Tupper, RN RH SAFETY RH STG ADHERE TO SAFETY PRECAUTIONS W/ASSISTANCE/DEVICE Description STG Adhere to Safety Precautions With  Cues/ reminders Assistance/Device.  03/19/2017 1744 - Progressing by Melina ModenaBurchett, Leler Brion, RN RH PAIN MANAGEMENT RH STG PAIN MANAGED AT OR BELOW PT'S PAIN GOAL Description Pain at or below level 5 with prn medications  03/19/2017 1744 - Progressing by Melina ModenaBurchett, Walda Hertzog, RN RH KNOWLEDGE DEFICIT SCI RH STG INCREASE KNOWLEDGE OF SELF CARE AFTER SCI Description Patient will be able to coordinate care and guide family in how to take care of him and address needs at discharge using cues/reminders  03/19/2017 1744 - Progressing by Melina ModenaBurchett, Shyne Lehrke, RN

## 2017-03-19 NOTE — Progress Notes (Signed)
Physical Therapy Session Note  Patient Details  Name: Victor [name] MRN: [id number] Date of Birth: 1956/03/22  Today's Date: 03/19/2017 PT Missed Time: 60 Minutes Missed Time Reason: Pain;Patient unwilling to participate  Short Term Goals: Week 1:  PT Short Term Goal 1 (Week 1): Pt will stay OOB >4 hours/day outside of regular therapy sessions PT Short Term Goal 2 (Week 1): Pt will perform bed mobility minA PT Short Term Goal 3 (Week 1): Pt will perform transfers w/c <>bed minA PT Short Term Goal 4 (Week 1): Pt will perform gait x10' maxA  Skilled Therapeutic Interventions/Progress Updates:   Pt refusing to participate in therapy this session 2/2 8/10 back pain, made 2 attempts. RN made aware of pain level. Offered to assist w/ repositioning for comfort and pain relief, pt continued to decline therapy.  Therapy Documentation Precautions:  Precautions Precautions: Fall Precaution Comments: paraparesis Restrictions Weight Bearing Restrictions: No General: PT Amount of Missed Time (min): 60 Minutes PT Missed Treatment Reason: Pain;Patient unwilling to participate Pain: Pain Assessment Pain Assessment: 0-10 Pain Score: 8  Pain Location: Back Pain Orientation: Lower Pain Descriptors / Indicators: Aching Pain Intervention(s): Medication (See eMAR)  See Function Navigator for Current Functional Status.   Therapy/Group: Individual Therapy  [name] Arnette 03/19/2017, 4:13 PM

## 2017-03-20 ENCOUNTER — Inpatient Hospital Stay (HOSPITAL_COMMUNITY): Payer: Self-pay

## 2017-03-20 MED ORDER — TRAZODONE HCL 50 MG PO TABS
50.0000 mg | ORAL_TABLET | Freq: Every evening | ORAL | Status: DC | PRN
  Administered 2017-03-22 (×2): 50 mg via ORAL
  Filled 2017-03-20 (×2): qty 1

## 2017-03-20 NOTE — Progress Notes (Signed)
Physical Therapy Session Note  Patient Details  Name: Victor [name] MRN: [id number] Date of Birth: 1956/03/12  Today's Date: 03/20/2017 PT Individual Time: 0900-1000 PT Individual Time Calculation (min): 60 min   Short Term Goals: Week 1:  PT Short Term Goal 1 (Week 1): Pt will stay OOB >4 hours/day outside of regular therapy sessions PT Short Term Goal 2 (Week 1): Pt will perform bed mobility minA PT Short Term Goal 3 (Week 1): Pt will perform transfers w/c <>bed minA PT Short Term Goal 4 (Week 1): Pt will perform gait x10' maxA  Skilled Therapeutic Interventions/Progress Updates:    Pt seated in w/c upon PT arrival, agreeable to therapy tx and denies pain. Pt propelled w/c x 150 ft with supervision, verbal cues for techniques. Pt worked on standing, knee control and pre-gait in parallel bars. Pt performed sit<>stands x 5 this session using parallel bars for UE support, mod assist and verbal cues for technique. Pt standing in parallel bars worked on symmetric LE weightbearing, minimizing UE support, and mini squats for knee control. Pt stepping forwards/backwards in place x 5 steps each LE using parallel bars for UE support, max assist. Pt transferred from w/c<>mat squat pivot with min assist. Pt worked on bed mobility, transferred from sitting>supine with mod assist. Pt worked on rolling with min assist in order to lay in sidelying, verbal cues for technique. In sidelying pt worked on gravity eliminated LE exercises for LE neuro re-ed. Using the powder board pt performed 2 x 10 AAROM of each: hip flexion, hip extension, knee flexion and knee extension. Pt demonstrating activation of hip flexors and hamstrings but unable to complete full ROM without assist, B LEs. With hip/knee extension, focused on slow/controlled movements. Pt transferred supine>sidelying>sitting with verbal cues for technqiues. Pt transferred back to w/c and left seated in w/c at end of session with needs in reach.    Therapy  Documentation Precautions:  Precautions Precautions: Fall Precaution Comments: paraparesis Restrictions Weight Bearing Restrictions: No   See Function Navigator for Current Functional Status.   Therapy/Group: Individual Therapy  Cresenciano GenreEmily van Schagen, PT, DPT 03/20/2017, 7:43 AM

## 2017-03-20 NOTE — Plan of Care (Signed)
  Not Progressing SCI BOWEL ELIMINATION RH STG SCI MANAGE BOWEL PROGRAM W/ASSIST OR AS APPROPRIATE Description STG SCI Manage bowel program w/assist or as mod I appropriate.  03/20/2017 0224 - Not Progressing by Martina SinnerMurray, Tola Meas A, RN Note Suppository daily requiring total assist.

## 2017-03-20 NOTE — Progress Notes (Signed)
Refused dulcolax supp this AM, sorbitol given per patient's request. "I might take it tomorrow." Scheduled lopressor held, per order parameters. Spilled urinal, required pad, brief and tshirt change. 2 PRN vicodin given for pain. Victor MartinezMurray, Victor [name]

## 2017-03-20 NOTE — Progress Notes (Signed)
Occupational Therapy Session Note  Patient Details  Name: Victor [name] MRN: [id number] Date of Birth: 07-Feb-1956  Today's Date: 03/20/2017 OT Individual Time: 1445-1545 OT Individual Time Calculation (min): 60 min    Short Term Goals: Week 1:  OT Short Term Goal 1 (Week 1): Pt will perform toilet transfer with mod A. OT Short Term Goal 2 (Week 1): Pt will perform clothing management after toileting with min A for balance. OT Short Term Goal 3 (Week 1): Pt will perform dynamic functional tasks while seated on EOB with min A for balance.   Skilled Therapeutic Interventions/Progress Updates:    1:1. Pt requesting to shower this session. Pt sit to stand in stedy with CGA for power up/balance to transfer onto TTB. Pt bathes at seated level leaning laterally with instructional cues to wash buttocks. Pt sliding on shower seat d/t soapy bottom, however pt able to adjust hips by extending elbows with B feet blocked from sliding on floor. Pt able to reach to ankles to wash legs with one hand on grab bar with supervision, however unable to wash B feet. Pt dresses seated on TTB donning button up shirt with supervision. OT threads BLE into pants and advances pants past hips when pt sit to stand in stedy. Seated in recliner with LE elevated, pt able to assume long sitting figure four with touching A to don B socks. Exited session with pt seated in w/c with call light in reach and all needs met.   Therapy Documentation Precautions:  Precautions Precautions: Fall Precaution Comments: paraparesis Restrictions Weight Bearing Restrictions: No  See Function Navigator for Current Functional Status.   Therapy/Group: Individual Therapy  Tonny Branch 03/20/2017, 4:51 PM

## 2017-03-20 NOTE — Plan of Care (Signed)
  Progressing Consults RH SPINAL CORD INJURY PATIENT EDUCATION Description  See Patient Education module for education specifics.  03/20/2017 1843 - Progressing by Melina ModenaBurchett, Deshanna Kama, RN SCI BOWEL ELIMINATION RH STG MANAGE BOWEL WITH ASSISTANCE Description STG Manage Bowel with mod I Assistance.  03/20/2017 1843 - Progressing by Melina ModenaBurchett, Jahmia Berrett, RN RH STG SCI MANAGE BOWEL WITH MEDICATION WITH ASSISTANCE Description STG SCI Manage bowel with medication with mod I assistance.  03/20/2017 1843 - Progressing by Melina ModenaBurchett, Dayan Kreis, RN RH STG SCI MANAGE BOWEL PROGRAM W/ASSIST OR AS APPROPRIATE Description STG SCI Manage bowel program w/assist or as mod I appropriate.  03/20/2017 1843 - Progressing by Melina ModenaBurchett, Xochil Shanker, RN SCI BLADDER ELIMINATION RH STG MANAGE BLADDER WITH ASSISTANCE Description STG Manage Bladder With  Min assist Assistance  03/20/2017 1843 - Progressing by Melina ModenaBurchett, Dalores Weger, RN RH SAFETY RH STG ADHERE TO SAFETY PRECAUTIONS W/ASSISTANCE/DEVICE Description STG Adhere to Safety Precautions With  Cues/ reminders Assistance/Device.  03/20/2017 1843 - Progressing by Melina ModenaBurchett, Gagan Dillion, RN RH PAIN MANAGEMENT RH STG PAIN MANAGED AT OR BELOW PT'S PAIN GOAL Description Pain at or below level 5 with prn medications  03/20/2017 1843 - Progressing by Melina ModenaBurchett, Rosealee Recinos, RN RH KNOWLEDGE DEFICIT SCI RH STG INCREASE KNOWLEDGE OF SELF CARE AFTER SCI Description Patient will be able to coordinate care and guide family in how to take care of him and address needs at discharge using cues/reminders  03/20/2017 1843 - Progressing by Melina ModenaBurchett, Cobie Leidner, RN

## 2017-03-20 NOTE — Progress Notes (Signed)
[place] PHYSICAL MEDICINE & REHABILITATION     PROGRESS NOTE    Subjective/Complaints: Did not sleep well.  Not sure why.  ROS: pt denies nausea, vomiting, diarrhea, cough, shortness of breath or chest pain    Objective: Vital Signs: Blood pressure 128/68, pulse (!) 104, temperature 98.1 F (36.7 C), temperature source Oral, resp. rate 12, height 5\' 7"  (1.702 m), weight 67.4 kg (148 lb 11.2 oz), SpO2 100 %. No results found. No results for input(s): WBC, HGB, HCT, PLT in the last 72 hours. No results for input(s): NA, K, CL, GLUCOSE, BUN, CREATININE, CALCIUM in the last 72 hours.  Invalid input(s): CO CBG (last 3)  No results for input(s): GLUCAP in the last 72 hours.  Wt Readings from Last 3 Encounters:  03/20/17 67.4 kg (148 lb 11.2 oz)  03/14/17 76.5 kg (168 lb 10.4 oz)  02/22/17 69.9 kg (154 lb)    Physical Exam:  Constitutional: He appearswell-developedand well-nourished.No distress.  HENT:  Head:Normocephalic.  Eyes:EOMare normal.  Neck:Normal range of motion.Neck supple.No thyromegalypresent.  Cardiovascular: RRR without murmur. No JVD  Respiratory:CTA Bilaterally without wheezes or rales. Normal effort    ZO:XWRUGI:Soft.Bowel sounds are normal. He exhibitsno distension.  Genitourinary:  Skin.   Skin intact.  Normal skin temperature neurological. Patient answers basic questions provides his name as well as date of birth. Follow simple commands. UE 5/5 bilaterally.Bilateral hip flexion is 1+ to 2- out of 5 knee extension  1+ out of 5 hip abduction and abduction are 1+ out of 5. Bilateral ankle dorsiflexion and plantar flexion are 2+out of 5.decr sensation below chest.     psychiatric: Pleasant and cooperative     Assessment/Plan: 1. 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 Physiatrist is providing close team supervision and 24  hour management of active medical problems listed below. Physiatrist and rehab team continue to assess barriers to discharge/monitor patient progress toward functional and medical goals.  Function:  Bathing Bathing position Bathing activity did not occur: N/A    Bathing parts Body parts bathed by patient: Right arm, Left arm, Chest, Abdomen Body parts bathed by helper: Back  Bathing assist        Upper Body Dressing/Undressing Upper body dressing Upper body dressing/undressing activity did not occur: N/A What is the patient wearing?: Pull over shirt/dress     Pull over shirt/dress - Perfomed by patient: Thread/unthread right sleeve, Thread/unthread left sleeve, Put head through opening, Pull shirt over trunk          Upper body assist Assist Level: Supervision or verbal cues      Lower Body Dressing/Undressing Lower body dressing Lower body dressing/undressing activity did not occur: N/A                                Lower body assist        Toileting Toileting Toileting activity did not occur: No continent bowel/bladder event        Toileting assist     Transfers Chair/bed transfer   Chair/bed transfer method: Squat pivot Chair/bed transfer assist level: Touching or steadying assistance (Pt > 75%) Chair/bed transfer assistive device: Armrests     Locomotion Ambulation     Max distance: 5' Assist level: 2 helpers   Wheelchair   Type: Manual Max wheelchair distance: 150 Assist Level: Supervision or verbal cues  Cognition Comprehension Comprehension assist level: Follows complex conversation/direction with extra time/assistive device  Expression Expression assist level: Expresses complex ideas: With extra time/assistive device  Social Interaction Social Interaction assist level: Interacts appropriately with others with medication or extra time (anti-anxiety, antidepressant).  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 Status post thrombectomy of left brachial artery, unsuccessful despite multiple attempts by vascular surgery 03/08/2017 as well as recent aortic arch replacement followed byTEVARprocedure 01/21/2017 -Continue inpatient rehab therapies 2. DVT Prophylaxis/Anticoagulation: SCDs.  Dopplers performed yesterday and negative 3. Pain Management:Hydrocodone as needed  -continue MS Contin 15 mg every 12 hours with good results thus far  4. Mood:Provide emotional support 5. Neuropsych: This patientiscapable of making decisions on hisown behalf. 6. Skin/Wound Care:Routine skin checks 7. Fluids/Electrolytes/Nutrition:I personally reviewed the patient's labs today.   Marland Kitchen.  8.Hypertension.   Lopressor 12.5 mg twice a day.     -keep lopressor at the same dose, held Cozaar 25 mg   -Blood pressure reasonable at present 9.Neurogenic bowel and bladder. Appears to have some bowel control. Continue softeners/laxative   -foley out, voiding trial under way, avoid condom cath when possible   -oob to void 10. Fever: No obvious respiratory or wound related issues at present.   -Urine culture with multi species.  Seems to be responding to amoxicillin so we will continue this for a 5-day course   LOS (Days) 5 A FACE TO FACE EVALUATION WAS PERFORMED  Ranelle OysterSWARTZ,Areta Terwilliger T, MD 03/20/2017 10:33 AM

## 2017-03-20 NOTE — Progress Notes (Signed)
Occupational Therapy Session Note  Patient Details  Name: Victor [name] MRN: [id number] Date of Birth: 18-Mar-1956  Today's Date: 03/20/2017 OT Individual Time: 0700-0800 OT Individual Time Calculation (min): 60 min    Short Term Goals: Week 1:  OT Short Term Goal 1 (Week 1): Pt will perform toilet transfer with mod A. OT Short Term Goal 2 (Week 1): Pt will perform clothing management after toileting with min A for balance. OT Short Term Goal 3 (Week 1): Pt will perform dynamic functional tasks while seated on EOB with min A for balance.   Skilled Therapeutic Interventions/Progress Updates:    1:1. Pt reporting pain 5/10 in back and requesting pain medication. Pt uses urinal with set up to empty/obtain. OT threads BLE into pants and instructs pt to roll B with A for LE management. Pt able to advance pants past hips with HOH A to reach for wasiteband. Pt supine>sitting EOB with MOD A for LE management and VC for anterior trunk flexion when elevating trunk to avoid dliding off bed. Pt eats EOB to challenge sitting balance with supervision and no LOB. Pt requesting to shower in the afternoon. Pt squat pivot transfer with MOD A for LE management. Pt able to get good clearance of buttocks with feet properly positioned. Pt shaves seated in w/c and discusses career. Ot shows different wheelchairs that assist with standing if pt has to stand long persiods of time. Exited session with pt seated in w/c with call light in reach and all needs met.   Therapy Documentation Precautions:  Precautions Precautions: Fall Precaution Comments: paraparesis Restrictions Weight Bearing Restrictions: No  See Function Navigator for Current Functional Status.   Therapy/Group: Individual Therapy  Tonny Branch 03/20/2017, 7:28 AM

## 2017-03-21 ENCOUNTER — Inpatient Hospital Stay (HOSPITAL_COMMUNITY): Payer: Self-pay

## 2017-03-21 ENCOUNTER — Inpatient Hospital Stay (HOSPITAL_COMMUNITY): Payer: Medicaid Other | Admitting: Occupational Therapy

## 2017-03-21 ENCOUNTER — Encounter (HOSPITAL_COMMUNITY): Payer: Self-pay | Admitting: Psychology

## 2017-03-21 ENCOUNTER — Inpatient Hospital Stay (HOSPITAL_COMMUNITY): Payer: Self-pay | Admitting: Physical Therapy

## 2017-03-21 NOTE — Progress Notes (Signed)
Physical Therapy Session Note  Patient Details  Name: Victor [name] MRN: [id number] Date of Birth: 1956-04-11  Today's Date: 03/21/2017 PT Individual Time: 1300-1400 PT Individual Time Calculation (min): 60 min   Short Term Goals: Week 1:  PT Short Term Goal 1 (Week 1): Pt will stay OOB >4 hours/day outside of regular therapy sessions PT Short Term Goal 2 (Week 1): Pt will perform bed mobility minA PT Short Term Goal 3 (Week 1): Pt will perform transfers w/c <>bed minA PT Short Term Goal 4 (Week 1): Pt will perform gait x10' maxA  Skilled Therapeutic Interventions/Progress Updates:  Pt received seated in w/c, denies pain and agreeable to treatment. W/c propulsion to gym BUE x150' with S for strengthening and endurance. Performed gait x10' in parallel bars modA for RLE stance control and swing phase; three trials with forward gait. Fourth trial performed 10' forward, 10' backward with modA. Transfer w/c <>mat table minA squat pivot. Sit >supine with S and increased time, cues for technique. Rolling supine<>prone with min guard. Attempted BLE hamstring curls, bridging, sidelying hip abduction, glute sets. Provided handouts with pictures of exercises pt can perform to target glutes, hip flexors, hip extensors/abductors, core. Performed quadruped and tall kneeling with focus on core/glute activation, side stepping R/L in tall kneeling for focus on BLE coordination and strengthening. Performed seated hamstring and hip abduction stretch in long sitting supported by wall. Returned to w/c with minA squat pivot; transported to room totalA. Remained seated in w/c at end of session, all needs in reach.      Therapy Documentation Precautions:  Precautions Precautions: Fall Precaution Comments: paraparesis Restrictions Weight Bearing Restrictions: No   See Function Navigator for Current Functional Status.   Therapy/Group: Individual Therapy  Vista Lawmanlizabeth J Tygielski 03/21/2017, 1:56 PM

## 2017-03-21 NOTE — Progress Notes (Signed)
Occupational Therapy Note  Patient Details  Name: Karna ChristmasMarcus E Vey MRN: [id number] Date of Birth: Feb 02, 1956  Today's Date: 03/21/2017 OT Individual Time: 1400-1430 OT Individual Time Calculation (min): 30 min   Pt denies pain Individual Therapy  Focus on w/c mobility, activity tolerance, and safety awareness.  Pt propelled w/c from room to Day Room and successfully navigated obstacle course with chairs.  Pt successfully backed up into narrow space.  Pt navigated in hallway with attention to navigating to obstacles in hallway.  Pt commented that his arms were getting tired with "all the rolling."  Pt returned to room and remained in w/c with all needs within reach.    [name], Nathin Saran [facility]appell 03/21/2017, 2:55 PM

## 2017-03-21 NOTE — Plan of Care (Signed)
  Not Progressing SCI BOWEL ELIMINATION RH STG MANAGE BOWEL WITH ASSISTANCE Description STG Manage Bowel with mod I Assistance.  No BM > 3 days. 03/21/2017 [id number] - Not Progressing by Martina SinnerMurray, Bethzaida Boord A, RN RH STG SCI MANAGE BOWEL WITH MEDICATION WITH ASSISTANCE Description STG SCI Manage bowel with medication with mod I assistance.  03/21/2017 [id number] - Not Progressing by Martina SinnerMurray, Richards Pherigo A, RN RH STG SCI MANAGE BOWEL PROGRAM W/ASSIST OR AS APPROPRIATE Description STG SCI Manage bowel program w/assist or as mod I appropriate.  03/21/2017 [id number] - Not Progressing by Martina SinnerMurray, Lakina Mcintire A, RN SCI BLADDER ELIMINATION RH STG MANAGE BLADDER WITH ASSISTANCE Description STG Manage Bladder With  Min assist Assistance  Spills urinal-Max assist  03/21/2017 [id number] - Not Progressing by Martina SinnerMurray, Jarryd Gratz A, RN

## 2017-03-21 NOTE — Plan of Care (Signed)
Patient has been constipated for a couple of days, has a BM today, no complains of pain, just some dizziness this am with therapy, continue to monitor his vitals.

## 2017-03-21 NOTE — Progress Notes (Signed)
Occupational Therapy Session Note  Patient Details  Name: Victor [name] MRN: [id number] Date of Birth: 12-30-1955  Today's Date: 03/21/2017 OT Individual Time: 0700-0800 OT Individual Time Calculation (min): 60 min    Short Term Goals: Week 1:  OT Short Term Goal 1 (Week 1): Pt will perform toilet transfer with mod A. OT Short Term Goal 2 (Week 1): Pt will perform clothing management after toileting with min A for balance. OT Short Term Goal 3 (Week 1): Pt will perform dynamic functional tasks while seated on EOB with min A for balance.   Skilled Therapeutic Interventions/Progress Updates:    Pt sitting in recliner upon arrival eating breakfast.  Pt propelled w/c to therapy gym.  Pt required mod A for sit<>stand at hi-lo table and maintained standing balance with min A and BUE support.  Pt's LLE began slipping and pt sat back into chair with mod A. Pt performed scoot transfer to mat and engaged in dynamic sitting activities.  Pt returned to w/c and propelled to room.  Pt remained in w/c with all needs within reach.  Focus on activity tolerance, sit<>stand, sitting balance, standing balance, w/c mobility, and safety awareness to increase independence with BADLs.   Therapy Documentation Precautions:  Precautions Precautions: Fall Precaution Comments: paraparesis Restrictions Weight Bearing Restrictions: No Pain: Pain Assessment Pain Assessment: 0-10 Pain Score: 2  Pain Type: Acute pain Pain Location: Back Pain Orientation: Mid;Lower Pain Descriptors / Indicators: Aching Pain Frequency: Intermittent Pain Onset: On-going Pain Intervention(s):repostioned   See Function Navigator for Current Functional Status.   Therapy/Group: Individual Therapy  Rich BraveLanier, Charliene Inoue [facility] 03/21/2017, 8:03 AM

## 2017-03-21 NOTE — Progress Notes (Signed)
Occupational Therapy Session Note  Patient Details  Name: Victor [name] MRN: [id number] Date of Birth: Aug 27, 1955  Today's Date: 03/21/2017 OT Individual Time: 1500-1557 OT Individual Time Calculation (min): 57 min    Short Term Goals: Week 1:  OT Short Term Goal 1 (Week 1): Pt will perform toilet transfer with mod A. OT Short Term Goal 2 (Week 1): Pt will perform clothing management after toileting with min A for balance. OT Short Term Goal 3 (Week 1): Pt will perform dynamic functional tasks while seated on EOB with min A for balance.   Skilled Therapeutic Interventions/Progress Updates:    Upon entering the room, pt seated in wheelchair awaiting therapist with mother present in room for observation. Pt reports extreme fatigue this session as well as 8/10 back pain. RN notified and medication given during this session. OT assisted pt to ADL apartment for energy conservation. OT educated and demonstrated how to maneuver wheelchair in kitchen at home to access refrigerator, counter, use of microwave, and pantry from wheelchair level. Pt returned demonstrations with min verbal cues for safety awareness with task. Pt verbalized understanding. Pt propelled wheelchair back to room 100' with supervision and increased time. Pt transferred from wheelchair > recliner chair with min A squat pivot transfer. Pt required set up of wheelchair and leg rests. OT provided printed handout of energy conservation techniques and educated on topic with education to continue. OT answered several questions for caregiver regarding discharge recommendations. Pt remained in recliner chair with call bell and all needed items within reach.   Therapy Documentation Precautions:  Precautions Precautions: Fall Precaution Comments: paraparesis Restrictions Weight Bearing Restrictions: No General:   Vital Signs:   Pain: Pain Assessment Pain Assessment: 0-10 Pain Score: 7  Pain Type: Acute pain Pain Location:  Back Pain Intervention(s): Medication (See eMAR) ADL:   Vision   Perception    Praxis   Exercises:   Other Treatments:    See Function Navigator for Current Functional Status.   Therapy/Group: Individual Therapy  Alen BleacherBradsher, Arcelia Pals P 03/21/2017, 4:04 PM

## 2017-03-21 NOTE — Consult Note (Signed)
Neuropsychological Consultation   Patient:   Victor [name]   DOB:   1955-08-21  MR Number:  [id number]  [facility] A [id number] [place] [id number] Dept: [phone] Loc: [phone]           Date of Service:   03/21/2017  Start Time:   9 AM End Time:   10 AM  Provider/Observer:  Arley PhenixJohn Monasia Lair, Psy.D.       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  Presented on 03/07/2017 with increasing weakness in lower extremities.  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  Patient has resulting paraplegia with only some movement returning so far.    Reason for Service: Victor [name] was referred for neuropsychological consultation due to adjustment and coping issues following spinal cord infarction.  Below is the HPI for the current admission.   UVO:[id number]:Victor [name] a 61 y.o.right handed malewith 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 Patient lives with elderly parentsreported to be independent prior to admission working as a Paediatric nursebarber. Presented 03/07/2017 with increasing weakness lower extremities. A lumbar MRI was unrevealing. 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 Underwent thrombectomy of left brachial artery, unsuccessful despite multiple attempts by vascular surgery11/13/2018 as well as placement of lumbar drain. Hospital course complicated by hypotensionthat did improve after a bolus of IV  fluids.Physical andOccupational therapy evaluation completed with recommendations of physical medicine rehabilitation consult  Current Status:  Victor [name] reports worry and coping issues due to loss of function and fears about not being able to ever walk again or regain functioning.  Behavioral Observation: Victor [name]  presents as a 61 y.o.-year-old Right African American Male who appeared his stated age. his dress was Appropriate and he was Well Groomed and his manners were Appropriate to the situation.  his participation was indicative of Appropriate and Attentive behaviors.  There were any physical disabilities noted.  he displayed an appropriate level of cooperation and motivation.     Interactions:    Active Appropriate and Attentive  Attention:   within normal limits and attention span and concentration were age appropriate  Memory:   within normal limits; recent and remote memory intact  Family Med/Psych History:  Family History  Problem Relation Age of Onset  . 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  Presented on 03/07/2017 with increasing weakness in lower extremities.  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  Patient has resulting paraplegia with only some movement returning so far.    Patient is generally flat in his interactions but denies significant symptoms of depression.  He does report that he is worried about how he will recover and what he will have to do if no further recovery takes place.  Worked on coping  issues during session today.  Diagnosis:    Paraplegia        Electronically Signed   _______________________ Arley PhenixJohn Amaziah Ghosh, Psy.D.

## 2017-03-21 NOTE — Progress Notes (Signed)
[place] PHYSICAL MEDICINE & REHABILITATION     PROGRESS NOTE    Subjective/Complaints: Had a good night sleeping. Reported dizziness when he got up with OT this morning. "room spinning" on two occasions when he was transferring  ROS: pt denies nausea, vomiting, diarrhea, cough, shortness of breath or chest pain    Objective: Vital Signs: Blood pressure 135/76, pulse 99, temperature 97.6 F (36.4 C), temperature source Oral, resp. rate 18, height 5\' 7"  (1.702 m), weight 69.8 kg (153 lb 14.1 oz), SpO2 100 %. No results found. No results for input(s): WBC, HGB, HCT, PLT in the last 72 hours. No results for input(s): NA, K, CL, GLUCOSE, BUN, CREATININE, CALCIUM in the last 72 hours.  Invalid input(s): CO CBG (last 3)  No results for input(s): GLUCAP in the last 72 hours.  Wt Readings from Last 3 Encounters:  03/21/17 69.8 kg (153 lb 14.1 oz)  03/14/17 76.5 kg (168 lb 10.4 oz)  02/22/17 69.9 kg (154 lb)    Physical Exam:  Constitutional: He appearswell-developedand well-nourished.No distress.  HENT:  Head:Normocephalic.  Eyes:EOMare normal.  Neck:Normal range of motion.Neck supple.No thyromegalypresent.  Cardiovascular: RRR without murmur. No JVD   Respiratory:CTA Bilaterally without wheezes or rales. Normal effort    WU:JWJXGI:Soft.Bowel sounds are normal. He exhibitsno distension.  Genitourinary:  Skin.   Skin intact.  Normal skin temperature neurological. Cognitively appropriate. No gross nystagmus seen with confrontation. UE 5/5 bilaterally.Bilateral hip flexion is 1+ to 2- out of 5 knee extension  1+ out of 5 hip abduction and abduction are 1+ out of 5. Bilateral ankle dorsiflexion and plantar flexion are 2+out of 5.decr sensation below chest.    Motor/sensory unchanged.  psychiatric: Pleasant and cooperative     Assessment/Plan: 1. 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 Physiatrist is providing close team supervision and 24 hour management of active medical problems listed below. Physiatrist and rehab team continue to assess barriers to discharge/monitor patient progress toward functional and medical goals.  Function:  Bathing Bathing position Bathing activity did not occur: N/A    Bathing parts Body parts bathed by patient: Right arm, Left arm, Chest, Abdomen, Front perineal area, Buttocks, Right upper leg, Left upper leg Body parts bathed by helper: Right lower leg, Left lower leg, Back  Bathing assist Assist Level: Touching or steadying assistance(Pt > 75%)      Upper Body Dressing/Undressing Upper body dressing Upper body dressing/undressing activity did not occur: N/A What is the patient wearing?: Button up shirt     Pull over shirt/dress - Perfomed by patient: Thread/unthread right sleeve, Thread/unthread left sleeve, Put head through opening, Pull shirt over trunk   Button up shirt - Perfomed by patient: Thread/unthread right sleeve, Thread/unthread left sleeve, Pull shirt around back, Button/unbutton shirt      Upper body assist Assist Level: Supervision or verbal cues      Lower Body Dressing/Undressing Lower body dressing Lower body dressing/undressing activity did not occur: N/A What is the patient wearing?: Non-skid slipper socks, Pants       Pants- Performed by helper: Thread/unthread right pants leg, Thread/unthread left pants leg, Pull pants up/down Non-skid slipper socks- Performed by patient: Don/doff right sock, Don/doff left sock                    Lower body assist Assist for lower body dressing: (MAX A)      Toileting  Toileting Toileting activity did not occur: No continent bowel/bladder event        Toileting assist     Transfers Chair/bed transfer   Chair/bed transfer method: Squat pivot Chair/bed transfer assist level: Touching or steadying assistance (Pt >  75%) Chair/bed transfer assistive device: Armrests     Locomotion Ambulation     Max distance: 5' Assist level: 2 helpers   Wheelchair   Type: Manual Max wheelchair distance: 150 Assist Level: Supervision or verbal cues  Cognition Comprehension Comprehension assist level: Follows complex conversation/direction with extra time/assistive device  Expression Expression assist level: Expresses complex ideas: With extra time/assistive device  Social Interaction Social Interaction assist level: Interacts appropriately with others with medication or extra time (anti-anxiety, antidepressant).  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 Status post thrombectomy of left brachial artery, unsuccessful despite multiple attempts by vascular surgery 03/08/2017 as well as recent aortic arch replacement followed byTEVARprocedure 01/21/2017 -Continue inpatient rehab therapies   - will request vestibular evaluation given pt's reported sx this morning 2. DVT Prophylaxis/Anticoagulation: SCDs.  Dopplers performed yesterday and negative 3. Pain Management:Hydrocodone as needed  -continue MS Contin 15 mg every 12 hours with good results thus far  4. Mood:Provide emotional support 5. Neuropsych: This patientiscapable of making decisions on hisown behalf. 6. Skin/Wound Care:Routine skin checks 7. Fluids/Electrolytes/Nutrition:I personally reviewed the patient's labs today.   Marland Kitchen.  8.Hypertension.   Lopressor 12.5 mg twice a day.     -keep lopressor at the same dose, held Cozaar 25 mg   -Blood pressure reasonable at present 9.Neurogenic bowel and bladder. Appears to have some bowel control. Continue softeners/laxative    -oob to void 10. Fever: afebrile now   -Urine culture with multi species.  Seems to be responding to amoxicillin so we will continue this for a 5-day course   LOS (Days) 6 A FACE TO FACE EVALUATION WAS PERFORMED  Ranelle OysterSWARTZ,Aideen Fenster T, MD 03/21/2017 8:57 AM

## 2017-03-22 ENCOUNTER — Inpatient Hospital Stay (HOSPITAL_COMMUNITY): Payer: Self-pay

## 2017-03-22 ENCOUNTER — Inpatient Hospital Stay (HOSPITAL_COMMUNITY): Payer: Self-pay | Admitting: Physical Therapy

## 2017-03-22 DIAGNOSIS — Z0279 Encounter for issue of other medical certificate: Secondary | ICD-10-CM

## 2017-03-22 NOTE — Progress Notes (Signed)
Physical Therapy Session Note  Patient Details  Name: Victor [name] MRN: [id number] Date of Birth: 1955/07/05  Today's Date: 03/22/2017 PT Individual Time: 0900-1000 PT Individual Time Calculation (min): 60 min   Short Term Goals: Week 1:  PT Short Term Goal 1 (Week 1): Pt will stay OOB >4 hours/day outside of regular therapy sessions PT Short Term Goal 2 (Week 1): Pt will perform bed mobility minA PT Short Term Goal 3 (Week 1): Pt will perform transfers w/c <>bed minA PT Short Term Goal 4 (Week 1): Pt will perform gait x10' maxA  Skilled Therapeutic Interventions/Progress Updates:    Pt seated in w/c upon PT arrival, agreeable to therapy tx and denies pain. Pt propelled w/c from room>gym x 100 ft using B UEs with supervision, verbal cues for technique/better mechanics. Pt in parallel bars worked on static standing balance without UE support, lateral weightshifts without UE support and hip/knee extension with manual facilitation. When sitting back down from standing pt reported dizziness and lightheadedness. BP monitored: 110/42, teds applied total assist. Pt transferred from w/c<>mat squat pivot with min assist and facilitation for LE weightbearing. Pt performed x 4 sit<>stands from the mat to minimize pulling up with UEs and increase LE weightbearing, pt requiring max assist from elevated mat, able to stabilize once in standing with manual facilitation for hip/knee extension. Pt seated edge of mat on a wedge worked on minimizing posterior pelvic tilt and upright posture while maintaining during dynamic seated balance reaching, manual facilitation for trunk extension and pelvic tilt. Pt left seated in w/c at end of session with needs in reach.     Therapy Documentation Precautions:  Precautions Precautions: Fall Precaution Comments: paraparesis Restrictions Weight Bearing Restrictions: No   See Function Navigator for Current Functional Status.   Therapy/Group: Individual  Therapy  Cresenciano GenreEmily van Schagen, PT, DPT 03/22/2017, 9:58 AM

## 2017-03-22 NOTE — Progress Notes (Signed)
Pt resting in chair at bedside and then assisted in to the bed per request. C/o pain in bilat lower exts and rates 7(10) on numerical scale. Morphine and vicodin administered both scheduled and prn doses and effective. Pt with noted edema of the scrotum and bilat lower exts and educated on off- loading to prevent Pus from forming. callbell within reach. Safety maintained. Will continue to monitor.

## 2017-03-22 NOTE — Progress Notes (Signed)
Physical Therapy Session Note  Patient Details  Name: Victor [name] MRN: [id number] Date of Birth: 1955-09-06  Today's Date: 03/22/2017 PT Individual Time: 1500-1530 PT Individual Time Calculation (min): 30 min   Short Term Goals: Week 1:  PT Short Term Goal 1 (Week 1): Pt will stay OOB >4 hours/day outside of regular therapy sessions PT Short Term Goal 2 (Week 1): Pt will perform bed mobility minA PT Short Term Goal 3 (Week 1): Pt will perform transfers w/c <>bed minA PT Short Term Goal 4 (Week 1): Pt will perform gait x10' maxA  Skilled Therapeutic Interventions/Progress Updates: Pt received seated in recliner, denies pain and agreeable to treatment. Pt doffs socks using reacher and increased time, cues for technique and problem solving. Requires totalA for donning shoes d/t tight fit. Recommended that pt have family bring in tennis shoes which can be loosened to increase ease of donning. Squat pivot transfer recliner>w/c and w/c <>mat table minA. Instructed pt in head/hips relationship, goal of increasing hip clearance over support surface for skin protection and energy efficiency; demos improved technique on final transfer after demo by therapist. Attempted sit <>stand from elevated mat table with pt in high perch position however requires maxA and heavy reliance on UEs on therapist during transfer. Sit <>supine with minA and cues for technique. Rolling supine<>prone minA for LE placement. Performed prone>quadruped>tall kneeling with UE support on stability ball modA overall. Tactile cues for glute activation and trunk extension. Pt became orthostatic during activity; required several minute rest break supine/prone on mat table to stabilize BP and reduce symptoms of dizziness. Pt unable to differentiate "dizziness" and "weakness" during activity, however suspect current symptoms related to orthostasis. Returned to w/c as above, transported to room totalA for energy conservation. Remained seated  in w/c with family present, all needs in reach.      Therapy Documentation Precautions:  Precautions Precautions: Fall Precaution Comments: paraparesis Restrictions Weight Bearing Restrictions: No   See Function Navigator for Current Functional Status.   Therapy/Group: Individual Therapy  Vista Lawmanlizabeth J Tygielski 03/22/2017, 3:30PM

## 2017-03-22 NOTE — Progress Notes (Signed)
Occupational Therapy Session Note  Patient Details  Name: Victor [name] MRN: [id number] Date of Birth: October 27, 1955  Today's Date: 03/22/2017 OT Individual Time: 0700-0800 OT Individual Time Calculation (min): 60 min    Short Term Goals: Week 1:  OT Short Term Goal 1 (Week 1): Pt will perform toilet transfer with mod A. OT Short Term Goal 2 (Week 1): Pt will perform clothing management after toileting with min A for balance. OT Short Term Goal 3 (Week 1): Pt will perform dynamic functional tasks while seated on EOB with min A for balance.   Skilled Therapeutic Interventions/Progress Updates:    Pt resting in bed upon arrival and agreeable to therapy.  Pt elected to shower during a later therapy session.  Pt required min A for supine>sit EOB in preparation for squat pivot transfer to w/c.  Pt performed squat/scoot transfer to w/c with min A and completed grooming tasks at sink.  Pt engaged in ongoing w/c mobility and w/c setup for transfers.  Pt demonstrates improved w/c mobility skills and is gaining proficiency in navigating around obstacles safely.  Pt returned to room and transferred to recliner.  Pt remained in recliner with all needs within reach.   Therapy Documentation Precautions:  Precautions Precautions: Fall Precaution Comments: paraparesis Restrictions Weight Bearing Restrictions: No  Pain: Pt denies pain See Function Navigator for Current Functional Status.   Therapy/Group: Individual Therapy  Rich BraveLanier, Johncarlos Holtsclaw Chappell 03/22/2017, 8:01 AM

## 2017-03-22 NOTE — Progress Notes (Signed)
[place] PHYSICAL MEDICINE & REHABILITATION     PROGRESS NOTE    Subjective/Complaints: Didn't sleep well last night. Couldn't get comfortable in bed. Up in w/c a good amount of night.   ROS: pt denies nausea, vomiting, diarrhea, cough, shortness of breath or chest pain   Objective: Vital Signs: Blood pressure 139/69, pulse 93, temperature (!) 97.5 F (36.4 C), temperature source Oral, resp. rate 18, height 5\' 7"  (1.702 m), weight 73.1 kg (161 lb 2.5 oz), SpO2 100 %. No results found. No results for input(s): WBC, HGB, HCT, PLT in the last 72 hours. No results for input(s): NA, K, CL, GLUCOSE, BUN, CREATININE, CALCIUM in the last 72 hours.  Invalid input(s): CO CBG (last 3)  No results for input(s): GLUCAP in the last 72 hours.  Wt Readings from Last 3 Encounters:  03/22/17 73.1 kg (161 lb 2.5 oz)  03/14/17 76.5 kg (168 lb 10.4 oz)  02/22/17 69.9 kg (154 lb)    Physical Exam:  Constitutional: He appearswell-developedand well-nourished.No distress.  HENT:  Head:Normocephalic.  Eyes:EOMare normal.  Neck:Normal range of motion.Neck supple.No thyromegalypresent.  Cardiovascular: RRR without murmur. No JVD    Respiratory:CTA Bilaterally without wheezes or rales. Normal effort    YQ:MVHQGI:Soft.Bowel sounds are normal. He exhibitsno distension.  Genitourinary:  Skin.   Intact  neurological. Cognitively appropriate. No gross nystagmus seen with confrontation. UE 5/5 bilaterally.Bilateral hip flexion is 1+ to 2- out of 5 knee extension  1+ out of 5 hip abduction and abduction are 1+ out of 5. Bilateral ankle dorsiflexion and plantar flexion are 2 to 2+out of 5.decr sensation below chest.    Motor/sensory exam stable  psychiatric: flat     Assessment/Plan: 1. 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 Physiatrist is providing close team supervision  and 24 hour management of active medical problems listed below. Physiatrist and rehab team continue to assess barriers to discharge/monitor patient progress toward functional and medical goals.  Function:  Bathing Bathing position Bathing activity did not occur: N/A    Bathing parts Body parts bathed by patient: Right arm, Left arm, Chest, Abdomen, Front perineal area, Buttocks, Right upper leg, Left upper leg Body parts bathed by helper: Right lower leg, Left lower leg, Back  Bathing assist Assist Level: Touching or steadying assistance(Pt > 75%)      Upper Body Dressing/Undressing Upper body dressing Upper body dressing/undressing activity did not occur: N/A What is the patient wearing?: Button up shirt     Pull over shirt/dress - Perfomed by patient: Thread/unthread right sleeve, Thread/unthread left sleeve, Put head through opening, Pull shirt over trunk   Button up shirt - Perfomed by patient: Thread/unthread right sleeve, Thread/unthread left sleeve, Pull shirt around back, Button/unbutton shirt      Upper body assist Assist Level: Supervision or verbal cues      Lower Body Dressing/Undressing Lower body dressing Lower body dressing/undressing activity did not occur: N/A What is the patient wearing?: Non-skid slipper socks, Pants       Pants- Performed by helper: Thread/unthread right pants leg, Thread/unthread left pants leg, Pull pants up/down Non-skid slipper socks- Performed by patient: Don/doff right sock, Don/doff left sock                    Lower body assist Assist for lower body dressing: (MAX A)      Toileting Toileting Toileting activity did not occur: No  continent bowel/bladder event   Toileting steps completed by helper: Adjust clothing prior to toileting, Performs perineal hygiene, Adjust clothing after toileting(per Candice Applewhite, NT)    Toileting assist     Transfers Chair/bed transfer   Chair/bed transfer method: Squat  pivot Chair/bed transfer assist level: Touching or steadying assistance (Pt > 75%) Chair/bed transfer assistive device: Armrests     Locomotion Ambulation     Max distance: 5' Assist level: 2 helpers   Wheelchair   Type: Manual Max wheelchair distance: 100' Assist Level: Supervision or verbal cues  Cognition Comprehension Comprehension assist level: Follows complex conversation/direction with extra time/assistive device  Expression Expression assist level: Expresses complex ideas: With extra time/assistive device  Social Interaction Social Interaction assist level: Interacts appropriately with others with medication or extra time (anti-anxiety, antidepressant).  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 Status post thrombectomy of left brachial artery, unsuccessful despite multiple attempts by vascular surgery 03/08/2017 as well as recent aortic arch replacement followed byTEVARprocedure 01/21/2017 -Continue inpatient rehab therapies   - team conference today   -vestibular assessment requested 2. DVT Prophylaxis/Anticoagulation: SCDs.  Dopplers performed yesterday and negative 3. Pain Management:Hydrocodone as needed  -continue MS Contin 15 mg every 12 hours with good results thus far  4. Mood:Provide emotional support. Neuro-psych notes reviewed   -may use trazodone at night if he feels that it's helping 5. Neuropsych: This patientiscapable of making decisions on hisown behalf. 6. Skin/Wound Care:Routine skin checks 7. Fluids/Electrolytes/Nutrition:I personally reviewed the patient's labs today.   Marland Kitchen.  8.Hypertension.   Lopressor 12.5 mg twice a day.     -contine lopressor, holding Cozaar 25  mg   -Blood pressure reasonable at present 9.Neurogenic bowel and bladder. Appears to have general bowel/bladder control. Continue softeners/laxative   -oob to void   -can skip am suppository if he's emptying bowels 10. Fever: afebrile now   -Urine culture with multi species.  Seems to be responding to amoxicillin so we will continue this for a 5-day course   LOS (Days) 7 A FACE TO FACE EVALUATION WAS PERFORMED  Ranelle OysterSWARTZ,ZACHARY T, MD 03/22/2017 8:52 AM

## 2017-03-22 NOTE — Progress Notes (Signed)
Pt resting in chair for most of shift easily aroused called to have assist to bed and within a couple of hours called to request to get back into chair. Bed converted into chair to maintain pt safety pt also repositioned on right side to promote sleep trazodone administered and not effective pt is noted to utilize the urinal with incontinent episodes. Continues on amoxil po for uti and is without any noted adverse reactions refused am suppository, but preferred sorbitol to take instead. administered per order. bilat foot drop noted and requested to have boots removed early this morning will continue to monitor.

## 2017-03-22 NOTE — Progress Notes (Signed)
Occupational Therapy Session Note  Patient Details  Name: Victor ChristmasMarcus E Gierke MRN: [id number] Date of Birth: [birth year]  Today's Date: 03/22/2017 OT Individual Time: 1030-1130 OT Individual Time Calculation (min): 60 min    Short Term Goals: Week 1:  OT Short Term Goal 1 (Week 1): Pt will perform toilet transfer with mod A. OT Short Term Goal 2 (Week 1): Pt will perform clothing management after toileting with min A for balance. OT Short Term Goal 3 (Week 1): Pt will perform dynamic functional tasks while seated on EOB with min A for balance.   Skilled Therapeutic Interventions/Progress Updates:    Pt resting in recliner upon arrival.  Pt stated the was "a little drowsy" after taking meds and elected to complete bathing/dressing tasks at sink.  Pt transferred to w/c and positioned self at sink for bathing/dressing.  Pt required assistance bathing B lower legs and buttocks.  Pt requires min verbal cues for sequencing and safety awareness.  Pt required assistance threading and pulling pants over hips.  Pt performed sit<>stand from w/c at sink X 2 with min A and steady A when standing.  Pt requires more than a reasonable amount of time to complete all tasks.  Focus on activity tolerance, sit<>stand, standing balance, functional transfers, BADL retraining, and safety awareness to increase independence with BADLs. Pt remained seated in w/c with all needs within reach.   Therapy Documentation Precautions:  Precautions Precautions: Fall Precaution Comments: paraparesis Restrictions Weight Bearing Restrictions: No  Pain: Pain Assessment Pain Score: 0-No pain Faces Pain Scale: No hurt  See Function Navigator for Current Functional Status.   Therapy/Group: Individual Therapy  Rich BraveLanier, Marshon Bangs Chappell 03/22/2017, 12:11 PM

## 2017-03-23 ENCOUNTER — Inpatient Hospital Stay (HOSPITAL_COMMUNITY): Payer: Self-pay

## 2017-03-23 DIAGNOSIS — G4700 Insomnia, unspecified: Secondary | ICD-10-CM

## 2017-03-23 MED ORDER — TRAZODONE HCL 50 MG PO TABS
100.0000 mg | ORAL_TABLET | Freq: Every day | ORAL | Status: DC
  Administered 2017-03-23 – 2017-04-05 (×13): 100 mg via ORAL
  Filled 2017-03-23 (×15): qty 2

## 2017-03-23 NOTE — Progress Notes (Signed)
Occupational Therapy Session Note  Patient Details  Name: Victor [name] MRN: [id number] Date of Birth: March 06, 1956  Today's Date: 03/23/2017 OT Individual Time: 1100-1200 OT Individual Time Calculation (min): 60 min    Short Term Goals: Week 1:  OT Short Term Goal 1 (Week 1): Pt will perform toilet transfer with mod A. OT Short Term Goal 2 (Week 1): Pt will perform clothing management after toileting with min A for balance. OT Short Term Goal 3 (Week 1): Pt will perform dynamic functional tasks while seated on EOB with min A for balance.   Skilled Therapeutic Interventions/Progress Updates:    Pt engaged in BADL retraining including bathing/dressing with sit<>stand from w/c at sink. Pt requires more than a reasonable amount of time to complete all tasks.  Pt completed grooming tasks including shaving while seated at sink.  Pt used Stedy for sit<>stand and standing balance. Pt requires BUE support when standing and knees hyperextend.  Pt requires assistance with bathing BLE (lower legs/feet) and LB dressing tasks.  Pt remained seated in w/c with all needs within reach.  Therapy Documentation Precautions:  Precautions Precautions: Fall Precaution Comments: paraparesis Restrictions Weight Bearing Restrictions: No Pain:  Pt c/o lower back pain after sitting unsupported for ~15 mins; repositioned  See Function Navigator for Current Functional Status.   Therapy/Group: Individual Therapy  Rich BraveLanier, Roczen Waymire Chappell 03/23/2017, 2:39 PM

## 2017-03-23 NOTE — Patient Instructions (Signed)
Gaze Stabilization: Sitting    Keeping eyes on target on wall \\_10N  feet away, tilt head down 15-30 and move head side to side for __30-60__ seconds. Repeat while moving head up and down for 30-60____ seconds. Do _4___ sessions per day. Repeat using target on pattern background.  Copyright  VHI. All rights reserved.  Gaze Stabilization: Tip Card  1.Target must remain in focus, not blurry, and appear stationary while head is in motion. 2.Perform exercises with small head movements (45 to either side of midline). 3.Increase speed of head motion so long as target is in focus. 4.If you wear eyeglasses, be sure you can see target through lens (therapist will give specific instructions for bifocal / progressive lenses). 5.These exercises may provoke dizziness or nausea. Work through these symptoms. If too dizzy, slow head movement slightly. Rest between each exercise. 6.Exercises demand concentration; avoid distractions. 7.For safety, perform standing exercises close to a counter, wall, corner, or next to someone.  Copyright  VHI. All rights reserved.

## 2017-03-23 NOTE — Progress Notes (Signed)
[place] PHYSICAL MEDICINE & REHABILITATION     PROGRESS NOTE    Subjective/Complaints: Slept a little better last night. Doesn't offer much else  ROS: pt denies nausea, vomiting, diarrhea, cough, shortness of breath or chest pain   Objective: Vital Signs: Blood pressure 97/61, pulse 96, temperature 98.9 F (37.2 C), temperature source Oral, resp. rate 12, height 5\' 7"  (1.702 m), weight 67.4 kg (148 lb 9.4 oz), SpO2 100 %. No results found. No results for input(s): WBC, HGB, HCT, PLT in the last 72 hours. No results for input(s): NA, K, CL, GLUCOSE, BUN, CREATININE, CALCIUM in the last 72 hours.  Invalid input(s): CO CBG (last 3)  No results for input(s): GLUCAP in the last 72 hours.  Wt Readings from Last 3 Encounters:  03/23/17 67.4 kg (148 lb 9.4 oz)  03/14/17 76.5 kg (168 lb 10.4 oz)  02/22/17 69.9 kg (154 lb)    Physical Exam:  Constitutional: He appearswell-developedand well-nourished.No distress.  HENT:  Head:Normocephalic.  Eyes:EOMare normal.  Neck:Normal range of motion.Neck supple.No thyromegalypresent.  Cardiovascular: RRR without murmur. No JVD    Respiratory:CTA Bilaterally without wheezes or rales. Normal effort  ZO:XWRUGI:Soft.Bowel sounds are normal. He exhibitsno distension.  Genitourinary:  Skin.   Intact  neurological. Cognitively appropriate. No gross nystagmus seen with confrontation. UE 5/5 bilaterally.Bilateral hip flexion is 1+ to 2- out of 5 knee extension  1+ out of 5 hip abduction and abduction are 1+ out of 5. Bilateral ankle dorsiflexion and plantar flexion are 2 to 2+out of 5.decr sensation below chest.    Motor/sensory exam essentially unchanged  psychiatric: flat and not too engaged     Assessment/Plan: 1. 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 Physiatrist is providing close team supervision and 24 hour  management of active medical problems listed below. Physiatrist and rehab team continue to assess barriers to discharge/monitor patient progress toward functional and medical goals.  Function:  Bathing Bathing position Bathing activity did not occur: N/A Position: Wheelchair/chair at sink  Bathing parts Body parts bathed by patient: Right arm, Left arm, Chest, Abdomen, Front perineal area, Right upper leg, Left upper leg Body parts bathed by helper: Right lower leg, Left lower leg, Buttocks  Bathing assist Assist Level: Touching or steadying assistance(Pt > 75%)      Upper Body Dressing/Undressing Upper body dressing Upper body dressing/undressing activity did not occur: N/A What is the patient wearing?: Pull over shirt/dress     Pull over shirt/dress - Perfomed by patient: Thread/unthread right sleeve, Thread/unthread left sleeve, Put head through opening   Button up shirt - Perfomed by patient: Thread/unthread right sleeve, Thread/unthread left sleeve, Pull shirt around back, Button/unbutton shirt      Upper body assist Assist Level: Supervision or verbal cues      Lower Body Dressing/Undressing Lower body dressing Lower body dressing/undressing activity did not occur: N/A What is the patient wearing?: Pants, Ted Hose, Non-skid slipper socks     Pants- Performed by patient: Thread/unthread right pants leg Pants- Performed by helper: Thread/unthread left pants leg, Pull pants up/down Non-skid slipper socks- Performed by patient: Don/doff right sock, Don/doff left sock                 TED Hose - Performed by helper: Don/doff right TED hose, Don/doff left TED hose  Lower body assist Assist for lower body dressing: (MAX A)      Toileting Toileting  Toileting activity did not occur: No continent bowel/bladder event   Toileting steps completed by helper: Adjust clothing prior to toileting, Performs perineal hygiene, Adjust clothing after toileting(per Candice Applewhite,  NT)    Toileting assist     Transfers Chair/bed transfer   Chair/bed transfer method: Squat pivot Chair/bed transfer assist level: Maximal assist (Pt 25 - 49%/lift and lower) Chair/bed transfer assistive device: Bedrails, Armrests     Locomotion Ambulation     Max distance: 5' Assist level: 2 helpers   Wheelchair   Type: Manual Max wheelchair distance: 100' Assist Level: Supervision or verbal cues  Cognition Comprehension Comprehension assist level: Follows complex conversation/direction with no assist  Expression Expression assist level: Expresses complex ideas: With no assist  Social Interaction Social Interaction assist level: Interacts appropriately with others - No medications needed.  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 Status post thrombectomy of left brachial artery, unsuccessful despite multiple attempts by vascular surgery 03/08/2017 as well as recent aortic arch replacement followed byTEVARprocedure 01/21/2017 -Continue inpatient rehab therapies   -vestibular assessment requested 2. DVT Prophylaxis/Anticoagulation: SCDs.  Dopplers performed yesterday and negative 3. Pain Management:Hydrocodone as needed  -continue MS Contin 15 mg every 12 hours with good results thus far  4. Mood:Provide emotional support. Neuro-psych notes reviewed   -will increase and schedule trazodone to help with sleep 5. Neuropsych: This patientiscapable of making decisions on hisown behalf. 6. Skin/Wound Care:Routine skin checks 7. Fluids/Electrolytes/Nutrition:I personally reviewed the patient's labs today.   Marland Kitchen.  8.Hypertension.   Lopressor 12.5 mg twice a day.     -contine lopressor, holding  Cozaar 25 mg   -Blood pressure reasonable at present 9.Neurogenic bowel and bladder. Appears to have general bowel/bladder control. Continue softeners/laxative   -oob to void   -can skip am suppository if he's emptying bowels 10. Fever: afebrile now   -Urine culture with multi species.  Seems to be responding to amoxicillin so we will continue this for a 5-day course   LOS (Days) 8 A FACE TO FACE EVALUATION WAS PERFORMED  Ranelle OysterSWARTZ,Kayonna Lawniczak T, MD 03/23/2017 9:03 AM

## 2017-03-23 NOTE — Progress Notes (Signed)
Occupational Therapy Session Note  Patient Details  Name: Victor [name] MRN: [id number] Date of Birth: 1955-10-18  Today's Date: 03/23/2017 OT Individual Time: 0700-0800 OT Individual Time Calculation (min): 60 min    Short Term Goals: Week 1:  OT Short Term Goal 1 (Week 1): Pt will perform toilet transfer with mod A. OT Short Term Goal 2 (Week 1): Pt will perform clothing management after toileting with min A for balance. OT Short Term Goal 3 (Week 1): Pt will perform dynamic functional tasks while seated on EOB with min A for balance.   Skilled Therapeutic Interventions/Progress Updates:    Pt resting in w/c upon arrival.  Focus on grooming tasks, discharge planning, safety awareness, w/c mobility, sit<>stand, standing balance, and functional transfers.  Pt performs scoot transfers with min A.  Pt currently requires min A for sit<>stand and min A for standing balance with BUE support.  Pt's stands for approx 30 seconds before fatiguing. Pt continues to exhibit improved w/c mobility in congested envirionment.  Pt returned and remained in w/c to eat breakfast.   Therapy Documentation Precautions:  Precautions Precautions: Fall Precaution Comments: paraparesis Restrictions Weight Bearing Restrictions: No Pain:    See Function Navigator for Current Functional Status.   Therapy/Group: Individual Therapy  Rich BraveLanier, Bibi Economos Chappell 03/23/2017, 8:07 AM

## 2017-03-23 NOTE — Progress Notes (Signed)
Physical Therapy Session Note/Vestibular Evaluation  Patient Details  Name: Victor [name] MRN: [id number] Date of Birth: 1956-01-22  Today's Date: 03/23/2017 PT Individual Time: [id number] PT Individual Time Calculation (min): 75 min   Short Term Goals: Week 1:  PT Short Term Goal 1 (Week 1): Pt will stay OOB >4 hours/day outside of regular therapy sessions PT Short Term Goal 2 (Week 1): Pt will perform bed mobility minA PT Short Term Goal 3 (Week 1): Pt will perform transfers w/c <>bed minA PT Short Term Goal 4 (Week 1): Pt will perform gait x10' maxA  Skilled Therapeutic Interventions/Progress Updates:    Patient seen for vestibular assessment as noted below.  Was noted to have positive symptoms with saccades L back to midline with 2 beats R nystagmus and L eye lower in orbit than R, but negative cover/uncover test.  Patient with some difficulty determining difference in symptoms with light headedness and spinning.  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  Feel he will benefit from gaze stabilization in addition to progressive mobility training to allow for adaptation.  Patient standing this session in parallel bars as part of motion sensitivity exam performing R and L pivot and then attempting to roll to L and L knee buckled and pt caught himself on bars and assisted back into standing.  No noted injury or pain and pt encouraged to stand again for orthostatics and stood with min A.  Patient assisted in chair back to his room due to c/o arm fatigue from earlier session.  Educated in gaze stabilization exercises sitting with target on wall 5-10' away.  Given written handout and plans for practice in therapy session first prior to performing on his own.  Left in w/c all needs in reach.   Therapy Documentation Precautions:  Precautions Precautions: Fall Precaution Comments:  paraparesis Restrictions Weight Bearing Restrictions: No Pain: Pain Assessment Pain Score: 2  Pain Location: Back Pain Orientation: Lower Pain Descriptors / Indicators: Aching;Dull Pain Onset: With Activity 2nd Pain Site Pain Type: Chronic pain    Other Treatments:     Vestibular Assessment - 03/23/17 0904      Vestibular Assessment   General Observation  Patient relates symptoms somtimes light headed, sometimes spinning and sometimes both.  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  Now symptoms last maybe a few minutes and feels comes on when doing something active.  States hard to differentiate between spinning and light headedness.  Has no visual or hearing changes.       Symptom Behavior   Type of Dizziness  Spinning and light headed    Frequency of Dizziness  intermittent    Duration of Dizziness  minutes    Aggravating Factors  Activity in general    Relieving Factors  No known relieving factors      Occulomotor Exam   Occulomotor Alignment  Abnormal left eye lower in orbit    Gaze-induced  Absent    Head shaking Horizontal  Absent    Smooth Pursuits  Intact    Saccades  Comment R beat nystagmus x 2 when moving eyes L to center      Vestibulo-Occular Reflex   VOR 1 Head Only (x 1 viewing)  intact vertical and horizontal x 30 sec with min symptoms horizontal and more (up to 4/10) vertical    VOR to Slow  Head Movement  Positive right    VOR Cancellation  Normal      Auditory   Comments  intact with scratch test and equal bilateral      Positional Testing   Sidelying Test  Sidelying Right;Sidelying Left    Horizontal Canal Testing  Horizontal Canal Right;Horizontal Canal Left      Sidelying Right   Sidelying Right Duration  35 sec    Sidelying Right Symptoms  No nystagmus      Sidelying Left   Sidelying Left Duration  35 sec    Sidelying Left Symptoms  No nystagmus      Horizontal Canal Right    Horizontal Canal Right Duration  30 sec    Horizontal Canal Right Symptoms  Normal      Horizontal Canal Left   Horizontal Canal Left Duration  30 sec    Horizontal Canal Left Symptoms  Normal      Positional Sensitivities   Sit to Supine  No dizziness    Supine to Left Side  No dizziness    Supine to Right Side  No dizziness    Supine to Sitting  No dizziness    Right Hallpike  No dizziness modified hallpike    Up from Right Hallpike  Lightheadedness modified hallpike    Up from Left Hallpike  Lightheadedness modified hallpike    Nose to Right Knee  No dizziness    Right Knee to Sitting  Lightedness    Nose to Left Knee  No dizziness    Left Knee to Sitting  Lightheadedness    Head Turning x 5  No dizziness    Head Nodding x 5  Mild dizziness    Pivot Right in Standing  No dizziness    Pivot Left in Standing  No dizziness    Rolling Right  -- unable to test due to LE weakness    Rolling Left  -- unable to test due to LE weakness      Orthostatics   BP sitting  107/52    HR sitting  92    BP standing (after 1 minute)  113/69    HR standing (after 1 minute)  102        See Function Navigator for Current Functional Status.   Therapy/Group: Individual Therapy  Elray McgregorCynthia Marky Buresh 03/23/2017, 9:36 AM

## 2017-03-23 NOTE — Progress Notes (Signed)
Pt weighed this morning for validation. Bed zeroed out and scd machine was removed off of bed onto the floor and obtained weight is 67.4 kg. Pt aware.

## 2017-03-24 ENCOUNTER — Inpatient Hospital Stay (HOSPITAL_COMMUNITY): Payer: PRIVATE HEALTH INSURANCE | Admitting: Physical Therapy

## 2017-03-24 ENCOUNTER — Inpatient Hospital Stay (HOSPITAL_COMMUNITY): Payer: Self-pay

## 2017-03-24 ENCOUNTER — Other Ambulatory Visit: Payer: Self-pay

## 2017-03-24 NOTE — Progress Notes (Signed)
Social Work Patient ID: Victor ChristmasMarcus E Vandenbrink, male   DOB: 1955/05/09, 61 y.o.   MRN: [id number]   Have reviewed team conference with pt and mother (via phone). Both aware and agreeable with targeted d/c date of 12/12 and goals for supervision/ min assist @ w/c level. Discussed need for family ed closer to d/c.  Continue to follow.  Quetzaly Ebner, LCSW

## 2017-03-24 NOTE — Progress Notes (Signed)
Occupational Therapy Note  Patient Details  Name: Victor [name] MRN: [id number] Date of Birth: Dec 29, 1955  Today's Date: 03/24/2017 OT Individual Time: 0700-0755 OT Individual Time Calculation (min): 55 min   Pt c/o 6/10 pain in back and BLE; RN aware and meds admin Individual therapy  Pt asleep in bed upon arrival but easily aroused.  Focus on bed mobility, sitting balance EOB, scoot transfers, sit<>stand, and standing balance.  Pt requires mod A for supine>sit EOB with max verbal cues for sequencing and technique.  Pt requires BUE support for sitting balance EOB.  Pt required mod A for scoot transfer to w/c.  Pt continues to exhibit poor core strength when sitting unsupported and during transfers.  Pt required min A for sit<>stand with increased reliance on UB strength.  Pt remained in w/c with breakfast on tray.    [name], Smith Potenza [facility] LLCChappell 03/24/2017, 8:14 AM

## 2017-03-24 NOTE — Progress Notes (Signed)
Physical Therapy Weekly Progress Note  Patient Details  Name: Victor [name] MRN: [id number] Date of Birth: [birth year]  Beginning of progress report period: March 16, 2017 End of progress report period: March 24, 2017  Today's Date: 03/24/2017 PT Individual Time: 0900-1000 and 1330-1400 PT Individual Time Calculation (min): 60 min and 30 min (total 90 min)   Patient has met 3 of 4 short term goals.  Pt currently requires S/minA for bed mobility dependent on fatigue levels, min guard to S for squat pivot transfers. Pt able to propel w/c with S however limited distances due to fatigue and poor aerobic endurance. Gait training has been initiated, however requires maxA and non-functional at this time, with heavy reliance on UEs for compensation of weak LEs and core.   Patient continues to demonstrate the following deficits muscle weakness and muscle paralysis, decreased cardiorespiratoy endurance, impaired timing and sequencing, unbalanced muscle activation and decreased coordination and decreased sitting balance, decreased standing balance, decreased postural control, decreased balance strategies and paraparesis and therefore will continue to benefit from skilled PT intervention to increase functional independence with mobility.  Patient progressing toward long term goals..  Continue plan of care.  PT Short Term Goals Week 1:  PT Short Term Goal 1 (Week 1): Pt will stay OOB >4 hours/day outside of regular therapy sessions PT Short Term Goal 1 - Progress (Week 1): Met PT Short Term Goal 2 (Week 1): Pt will perform bed mobility minA PT Short Term Goal 2 - Progress (Week 1): Met PT Short Term Goal 3 (Week 1): Pt will perform transfers w/c <>bed minA PT Short Term Goal 3 - Progress (Week 1): Met PT Short Term Goal 4 (Week 1): Pt will perform gait x10' maxA PT Short Term Goal 4 - Progress (Week 1): Not met Week 2:  PT Short Term Goal 1 (Week 2): Pt will perform bed mobility with consistent  S PT Short Term Goal 2 (Week 2): Pt will perform transfers w/c <>bed with consistent S PT Short Term Goal 3 (Week 2): Pt will propel w/c x150' with S  PT Short Term Goal 4 (Week 2): Pt will perform gait x10' with maxA  Skilled Therapeutic Interventions/Progress Updates: Tx 1: pt received seated in w/c, denies pain but does c/o "stiffness" in BLEs. W/c propulsion to/from gym with BUE for strengthening and endurance with S x75' before limited by fatigue. Squat pivot transfer w/c >mat table with close S, cues for technique to increase hip clearance. Sit >supine minA for LE management. Demonstrated use of leg loops to pt; plan to have pt measured to have loops made. Glute stretch knee-to-chest x1 min each side. Returned to sitting with S d/t urinary urgency. Uses urinal on edge of mat with setupA to obtain items, increased time required for clothing management using lateral leans to don/doff pants and brief. Long sitting hamstring and hip ER stretch x2 min each position BLE. Performed sit <>stand from edge of mat with mod/maxA. Trial with BLE GRAFO to facilitate foot clearance and knee control; performed stand pivot to w/c with modA and assist for placing LLE, cues for glute activation. Returned to room as above; remained seated in w/c at end of session, all needs in reach.   Tx 2: Pt received seated in w/c, denies pain and agreeable to treatment. TEDs donned totalA. W/c propulsion x75' with BUE for strengthening and endurance. Sit <>stand in stedy 3 sets of 5 stands from stedy seat. Final set x3 reps from w/c lower seat  height. S overall; cues for terminal knee extension, glute activation and trunk extension. BP assessed 105/54 and pt asymptomatic. Returned to room totalA d/t fatigue at end of session; remained seated in w/c, all needs in reach.     Therapy Documentation Precautions:  Precautions Precautions: Fall Precaution Comments: paraparesis Restrictions Weight Bearing Restrictions: No   See  Function Navigator for Current Functional Status.  Therapy/Group: Individual Therapy  Luberta Mutter 03/24/2017, 11:36 AM

## 2017-03-24 NOTE — Progress Notes (Signed)
Occupational Therapy Session Note  Patient Details  Name: Victor ChristmasMarcus E Pitney MRN: [id number] Date of Birth: [birth year]  Today's Date: 03/24/2017 OT Individual Time: 1100-1200 OT Individual Time Calculation (min): 60 min    Short Term Goals: Week 2:  OT Short Term Goal 1 (Week 2): Pt will perform clothing management after toileting with min A for balance. OT Short Term Goal 2 (Week 2): Pt will perform LB dressing tasks with mod A with sit<>stand from w/c/EOB OT Short Term Goal 3 (Week 2): Pt will perform toilet transfers with min A and LRAD  Skilled Therapeutic Interventions/Progress Updates:    Pt engaged in BADL retraining including bathing at shower level and dressing with sit<>stand (Stedy) from w/c.  Pt used Stedy for transfer to shower seat and when standing to bathe buttocks. Pt was able to bathe buttocks when standing in [place].  Pt required use of Stedy for standing to pull up pants.  Pt requires more than a reasonable amount of time to complete all tasks.  Pt requires mod verbal cues for problem solving and assessing compensatory strategies for bathing/dressing tasks.  Pt remained in w/c with all needs within reach.  Focus on sitting balance, BADL retraining, standing balance in [place], BADL retraining, and safety awareness to increase independence with BADLs.   Therapy Documentation Precautions:  Precautions Precautions: Fall Precaution Comments: paraparesis Restrictions Weight Bearing Restrictions: No   Pain:    See Function Navigator for Current Functional Status.   Therapy/Group: Individual Therapy  Rich BraveLanier, Orbie Grupe Chappell 03/24/2017, 1:16 PM

## 2017-03-24 NOTE — Patient Care Conference (Signed)
Inpatient RehabilitationTeam Conference and Plan of Care Update Date: 03/22/2017   Time: 2:15 PM    Patient Name: Victor [name]      Medical Record Number: [id number]  Date of Birth: July 12, 1955 Sex: Male         Room/Bed: 4W17C/4W17C-01 Payor Info: Payor: MEDICAID PENDING / Plan: MEDICAID PENDING / Product Type: *No Product type* /    Admitting Diagnosis:  Thoracic spinal cord infaracts with paraplegis  Admit Date/Time:  03/15/2017  3:40 PM Admission Comments: No comment available   Primary Diagnosis:  <principal problem not specified> Principal Problem: <principal problem not specified>  Patient Active Problem List   Diagnosis Date Noted  . Spinal cord infarction (HCC) 03/15/2017  . Paraplegia (HCC) 03/07/2017  . Bilateral leg weakness 03/07/2017  . S/P thoracic aortic aneurysm repair 01/21/2017  . Coronary artery calcification 12/23/2016  . S/P aortic dissection repair 12/23/2016  . Thoracic aortic aneurysm without rupture (HCC) 12/23/2016  . Preoperative cardiovascular examination 12/23/2016  . Essential hypertension 09/23/2015  . Iron deficiency anemia 01/28/2015  . Weight loss 10/30/2014  . Hospital-acquired pneumonia 10/09/2014  . Ischemic leg 10/09/2014  . Aortic dissection (HCC) 10/07/2014    Expected Discharge Date: Expected Discharge Date: 04/06/17  Team Members Present: Physician leading conference: Dr. Faith RogueZachary Swartz Social Worker Present: Amada JupiterLucy Yeison Sippel, LCSW Nurse Present: Chrissie NoaMelanie Barnes, RN PT Present: Alyson ReedyElizabeth Tygielski, PT OT Present: Roney MansJennifer Smith, OT;Ardis Rowanom Lanier, COTA PPS Coordinator present : Tora DuckMarie Noel, RN, [facility]CRRN     Current Status/Progress Goal Weekly Team Focus  Medical   Thoracic spinal cord infarct with paraplegia and sensory loss.  Patient actually moving bowels and bladder fairly well.  Mild UTI  Maximize functional use of legs  Treatment of UTI, blood pressure and pain control, restore normal sleep patterns   Bowel/Bladder   continent with  incontinent episodes noted, uti continues on amoxil without any adverse reactions noted  maintain continence and skin integrity      Swallow/Nutrition/ Hydration   cardiac diet with thin liwuids  prevent weight loss and maintain current weight      ADL's   bathing-mod A; UB dressing-supervision; LB dressing-max A; functional transfers (slide board or squat pivot)- min A;   min A overall  BADL retraining; functional transfers; activity tolerance; safety awareness   Mobility   minA bed mobility and transfers, min/modA sit <>stand and gait in parallel bars  modI bed mobility, S transfers and w/c mobility, modA gait in controlled environment  LE NMR, core strengthening, activity tolerance, pre-gait/gait training   Communication   verbal communication with a flat affect  maintain verbal communication      Safety/Cognition/ Behavioral Observations  maintain current level of functionality  prevent injury / falls      Pain   pain management for bilat lower exts with 7(10) on numerical scale  have decreased pain scores to indicate adequate pain control      Skin   surgical incision to LUE that is well approximated and dry.  monitor for s/s of infection-keep area free from infection       Rehab Goals Patient on target to meet rehab goals: Yes *See Care Plan and progress notes for long and short-term goals.     Barriers to Discharge  Current Status/Progress Possible Resolutions Date Resolved   Physician    Medical stability;Neurogenic Bowel & Bladder        Continue patient education.  Reestablishing improved bowel and bladder function      Nursing  PT                    OT                  SLP                SW                Discharge Planning/Teaching Needs:  Pt to return home with his parents who both work p/t but family can provide 24/7 assistance.  Teaching to be planned prior to d/c.   Team Discussion:  Inconsistent with b/b continence. Resistant to bowel  program.  Min-guard squat-pivot transfers;  Working in // bars.   Decreased core strength.  Min/ mod assist ADLs.  Goals mostly for w/c level supervision and min assist ADLs.    Revisions to Treatment Plan:  None    Continued Need for Acute Rehabilitation Level of Care: The patient requires daily medical management by a physician with specialized training in physical medicine and rehabilitation for the following conditions: Daily direction of a multidisciplinary physical rehabilitation program to ensure safe treatment while eliciting the highest outcome that is of practical value to the patient.: Yes Daily medical management of patient stability for increased activity during participation in an intensive rehabilitation regime.: Yes Daily analysis of laboratory values and/or radiology reports with any subsequent need for medication adjustment of medical intervention for : Neurological problems;Urological problems  Victor [name] 03/24/2017, 10:36 AM

## 2017-03-24 NOTE — Progress Notes (Signed)
Occupational Therapy Weekly Progress Note  Patient Details  Name: KAMARION ZAGAMI MRN: [id number] Date of Birth: 26-Jun-1955  Beginning of progress report period: March 16, 2017 End of progress report period: March 24, 2017  Patient has met 2 of 3 short term goals.  Pt is making steady progress with BADLs and functional transfers since admission.  Pt continues to require max A for LB dressing and toileting tasks.  Pt performs scoot transfers with min A and squat pivot transfers with mod A.  Pt is able to perform sit<>stand with min A and stand at sink for approx 30 seconds with steady A.  Pt requires mod verbal cues to activate core when siiting EOB or in w/c.   Patient continues to demonstrate the following deficits: muscle weakness, decreased cardiorespiratoy endurance, decreased coordination and decreased motor planning and decreased sitting balance, decreased standing balance and decreased balance strategies and therefore will continue to benefit from skilled OT intervention to enhance overall performance with BADL and Reduce care partner burden.  Patient progressing toward long term goals..  Continue plan of care.  OT Short Term Goals Week 1:  OT Short Term Goal 1 (Week 1): Pt will perform toilet transfer with mod A. OT Short Term Goal 1 - Progress (Week 1): Met OT Short Term Goal 2 (Week 1): Pt will perform clothing management after toileting with min A for balance. OT Short Term Goal 2 - Progress (Week 1): Progressing toward goal OT Short Term Goal 3 (Week 1): Pt will perform dynamic functional tasks while seated on EOB with min A for balance.  OT Short Term Goal 3 - Progress (Week 1): Met Week 2:  OT Short Term Goal 1 (Week 2): Pt will perform clothing management after toileting with min A for balance. OT Short Term Goal 2 (Week 2): Pt will perform LB dressing tasks with mod A with sit<>stand from w/c/EOB OT Short Term Goal 3 (Week 2): Pt will perform toilet transfers with min A  and LRAD   Therapy Documentation Precautions:  Precautions Precautions: Fall Precaution Comments: paraparesis Restrictions Weight Bearing Restrictions: No  See Function Navigator for Current Functional Status.     Leotis Shames [facility] 03/24/2017, 6:50 AM

## 2017-03-24 NOTE — Progress Notes (Signed)
[place] PHYSICAL MEDICINE & REHABILITATION     PROGRESS NOTE    Subjective/Complaints: Slept well with change in trazodone.   ROS: pt denies nausea, vomiting, diarrhea, cough, shortness of breath or chest pain   Objective: Vital Signs: Blood pressure 130/79, pulse 93, temperature 98.1 F (36.7 C), temperature source Oral, resp. rate 18, height 5\' 7"  (1.702 m), weight 68.4 kg (150 lb 12.7 oz), SpO2 100 %. No results found. No results for input(s): WBC, HGB, HCT, PLT in the last 72 hours. No results for input(s): NA, K, CL, GLUCOSE, BUN, CREATININE, CALCIUM in the last 72 hours.  Invalid input(s): CO CBG (last 3)  No results for input(s): GLUCAP in the last 72 hours.  Wt Readings from Last 3 Encounters:  03/24/17 68.4 kg (150 lb 12.7 oz)  03/14/17 76.5 kg (168 lb 10.4 oz)  02/22/17 69.9 kg (154 lb)    Physical Exam:  Constitutional: He appearswell-developedand well-nourished.No distress.  HENT:  Head:Normocephalic.  Eyes:EOMare normal.  Neck:Normal range of motion.Neck supple.No thyromegalypresent.  Cardiovascular: RRR without murmur. No JVD    Respiratory:CTA Bilaterally without wheezes or rales. Normal effort   ZO:XWRUGI:Soft.Bowel sounds are normal. He exhibitsno distension.  Genitourinary:  Skin.   Intact  neurological. Cognitively appropriate. No gross nystagmus seen with confrontation. UE 5/5 bilaterally.Bilateral hip flexion is 1+ to 2- out of 5 knee extension  1+ out of 5 hip abduction and abduction are 1+ out of 5. Bilateral ankle dorsiflexion and plantar flexion are 2 to 2+out of 5.decr sensation below chest.    Motor/sensory exam essentially unchanged  psychiatric: flat and not too engaged     Assessment/Plan: 1. 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 Physiatrist is providing close team supervision and 24 hour management of  active medical problems listed below. Physiatrist and rehab team continue to assess barriers to discharge/monitor patient progress toward functional and medical goals.  Function:  Bathing Bathing position Bathing activity did not occur: N/A Position: Wheelchair/chair at sink  Bathing parts Body parts bathed by patient: Right arm, Left arm, Chest, Abdomen, Front perineal area, Right upper leg, Left upper leg Body parts bathed by helper: Buttocks, Right lower leg, Left lower leg  Bathing assist Assist Level: Touching or steadying assistance(Pt > 75%)      Upper Body Dressing/Undressing Upper body dressing Upper body dressing/undressing activity did not occur: N/A What is the patient wearing?: Pull over shirt/dress     Pull over shirt/dress - Perfomed by patient: Thread/unthread right sleeve, Thread/unthread left sleeve, Put head through opening, Pull shirt over trunk   Button up shirt - Perfomed by patient: Thread/unthread right sleeve, Thread/unthread left sleeve, Pull shirt around back, Button/unbutton shirt      Upper body assist Assist Level: Supervision or verbal cues      Lower Body Dressing/Undressing Lower body dressing Lower body dressing/undressing activity did not occur: N/A What is the patient wearing?: Pants, Non-skid slipper socks, Ted Hose     Pants- Performed by patient: Thread/unthread right pants leg Pants- Performed by helper: Thread/unthread left pants leg, Pull pants up/down Non-skid slipper socks- Performed by patient: Don/doff right sock, Don/doff left sock Non-skid slipper socks- Performed by helper: Don/doff right sock, Don/doff left sock               TED Hose - Performed by helper: Don/doff right TED hose, Don/doff left TED hose  Lower body assist Assist for  lower body dressing: (MAX A)      Toileting Toileting Toileting activity did not occur: No continent bowel/bladder event   Toileting steps completed by helper: Adjust clothing prior to  toileting, Performs perineal hygiene, Adjust clothing after toileting(per Candice Applewhite, NT)    Toileting assist     Transfers Chair/bed transfer   Chair/bed transfer method: Squat pivot Chair/bed transfer assist level: Maximal assist (Pt 25 - 49%/lift and lower) Chair/bed transfer assistive device: Bedrails, Armrests     Locomotion Ambulation     Max distance: 5' Assist level: 2 helpers   Wheelchair   Type: Manual Max wheelchair distance: 100' Assist Level: Supervision or verbal cues  Cognition Comprehension Comprehension assist level: Follows complex conversation/direction with no assist  Expression Expression assist level: Expresses complex ideas: With no assist  Social Interaction Social Interaction assist level: Interacts appropriately with others - No medications needed.  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 Status post thrombectomy of left brachial artery, unsuccessful despite multiple attempts by vascular surgery 03/08/2017 as well as recent aortic arch replacement followed byTEVARprocedure 01/21/2017 -Continue inpatient rehab therapies     2. DVT Prophylaxis/Anticoagulation: SCDs.  Dopplers performed yesterday and negative 3. Pain Management:Hydrocodone as needed  -continue MS Contin 15 mg every 12 hours with good results thus far  4. Mood:Provide emotional support. Neuro-psych notes reviewed   -continued schedule trazodone to help with sleep 5. Neuropsych: This patientiscapable of making decisions on hisown behalf. 6. Skin/Wound Care:Routine skin checks 7. Fluids/Electrolytes/Nutrition:I personally reviewed the patient's labs today.   Marland Kitchen.  8.Hypertension.   Lopressor 12.5  mg twice a day.     -contine lopressor, holding Cozaar 25 mg   -Blood pressure reasonable at present 9.Neurogenic bowel and bladder. Appears to have general bowel/bladder control. Continue softeners/laxative   -oob to void   -am suppository prn 10. Fever: afebrile now   -Urine culture with multi species. Completed 5 days amoxil   LOS (Days) 9 A FACE TO FACE EVALUATION WAS PERFORMED  Ranelle OysterSWARTZ,ZACHARY T, MD 03/24/2017 8:48 AM

## 2017-03-24 NOTE — Progress Notes (Signed)
      [address]       [place] [id number]             [phone]            Subjective: Patient sitting in wheelchair and eating breakfast. He states he is able to move left lowe extremity better than right.  Objective: Vital signs in last 24 hours: Temp:  [98.1 F (36.7 C)-98.4 F (36.9 C)] 98.1 F (36.7 C) (11/29 0500) Pulse Rate:  [87-93] 93 (11/29 0500) Resp:  [18] 18 (11/29 0500) BP: (110-130)/(61-79) 130/79 (11/29 0500) SpO2:  [100 %] 100 % (11/29 0500) Weight:  [150 lb 12.7 oz (68.4 kg)] 150 lb 12.7 oz (68.4 kg) (11/29 0500)   Current Weight  03/24/17 150 lb 12.7 oz (68.4 kg)      Intake/Output from previous day: 11/28 0701 - 11/29 0700 In: 702 [P.O.:702] Out: 600 [Urine:600]   Physical Exam:  Cardiovascular: RRR Pulmonary: Clear to auscultation bilaterally Extremities: Palpable pulses in feet. Has had some improvement in neuro status since last examination on 11/16.   Lab Results: CBC: No results for input(s): WBC, HGB, HCT, PLT in the last 72 hours. BMET:  No results for input(s): NA, K, CL, CO2, GLUCOSE, BUN, CREATININE, CALCIUM in the last 72 hours.  PT/INR:  Lab Results  Component Value Date   INR 1.28 03/12/2017   INR 1.35 03/08/2017   INR 1.09 01/21/2017   ABG:  INR: Will add last result for INR, ABG once components are confirmed Will add last 4 CBG results once components are confirmed  Assessment/Plan:  1. CV - SR in the 90's and BP well controlled. On Lopressor 12.5 mg bid. 2.  Pulmonary - On room air.  3. Thrombosis of left subclavian artery-Unsuccessful attempt at thrombectomy of left subclavian artery  4. 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 5. UTI-was treated with Amoxicillin 6. Appreciate care in CIR  Victor Anastasi M ZimmermanPA-C 03/24/2017,8:04 AM

## 2017-03-25 ENCOUNTER — Inpatient Hospital Stay (HOSPITAL_COMMUNITY): Payer: PRIVATE HEALTH INSURANCE

## 2017-03-25 ENCOUNTER — Inpatient Hospital Stay (HOSPITAL_COMMUNITY): Payer: PRIVATE HEALTH INSURANCE | Admitting: Physical Therapy

## 2017-03-25 ENCOUNTER — Inpatient Hospital Stay (HOSPITAL_COMMUNITY): Payer: PRIVATE HEALTH INSURANCE | Admitting: Occupational Therapy

## 2017-03-25 NOTE — Progress Notes (Signed)
Physical Therapy Session Note  Patient Details  Name: Victor [name] MRN: [id number] Date of Birth: 02/27/1956  Today's Date: 03/25/2017 PT Individual Time: 1330-1430 PT Individual Time Calculation (min): 60 min   Short Term Goals: Week 2:  PT Short Term Goal 1 (Week 2): Pt will perform bed mobility with consistent S PT Short Term Goal 2 (Week 2): Pt will perform transfers w/c <>bed with consistent S PT Short Term Goal 3 (Week 2): Pt will propel w/c x150' with S  PT Short Term Goal 4 (Week 2): Pt will perform gait x10' with maxA  Skilled Therapeutic Interventions/Progress Updates:    Pt seated in w/c in room, agreeable to participate in therapy session. Pt reports no pain this PM. Manual w/c propulsion 2 x 75 ft with Supervision. Ambulation 2 x 5 ft, 1 x 10 ft with RW and Min Assist, w/c follow, AFO to BLE, v/c for upright posture, hip hiking and vaulting to advance LE. Stand pivot transfer w/c to/from mat with Min Assist and RW. Sit to/from stand from elevated mat to RW with Min Assist with focus on hand placement for safe transfer. Quadruped alt L/R arm raises, attempt leg raises but pt is unable to perform. Prone hip extension with focus on glute contractions, pt requires active assist to perform extension but exhibits good glute activiation. Pt left seated in w/c in room with needs in reach and family in room at end of session.  Therapy Documentation Precautions:  Precautions Precautions: Fall Precaution Comments: paraparesis Restrictions Weight Bearing Restrictions: No     See Function Navigator for Current Functional Status.   Therapy/Group: Individual Therapy  Peter Congoaylor  [place] 03/25/2017, 3:47 PM

## 2017-03-25 NOTE — Plan of Care (Signed)
  Progressing Consults RH SPINAL CORD INJURY PATIENT EDUCATION Description  See Patient Education module for education specifics.  03/25/2017 1043 - Progressing by Melina ModenaBurchett, Ying Blankenhorn, RN SCI BLADDER ELIMINATION RH STG MANAGE BLADDER WITH ASSISTANCE Description STG Manage Bladder With  Min assist Assistance  03/25/2017 1043 - Progressing by Melina ModenaBurchett, Viona Hosking, RN RH SAFETY RH STG ADHERE TO SAFETY PRECAUTIONS W/ASSISTANCE/DEVICE Description STG Adhere to Safety Precautions With  Cues/ reminders Assistance/Device.  03/25/2017 1043 - Progressing by Melina ModenaBurchett, Devina Bezold, RN RH PAIN MANAGEMENT RH STG PAIN MANAGED AT OR BELOW PT'S PAIN GOAL Description Pain at or below level 5 with prn medications  03/25/2017 1043 - Progressing by Melina ModenaBurchett, Amir Fick, RN Note Pt anticipating pain medication needs prior to therapy, not waiting until pain is unmanageable prior to asking for medication RH KNOWLEDGE DEFICIT SCI RH STG INCREASE KNOWLEDGE OF SELF CARE AFTER SCI Description Patient will be able to coordinate care and guide family in how to take care of him and address needs at discharge using cues/reminders  03/25/2017 1043 - Progressing by Melina ModenaBurchett, Javanna Patin, RN

## 2017-03-25 NOTE — Progress Notes (Signed)
Physical Therapy Session Note  Patient Details  Name: Victor [name] MRN: [id number] Date of Birth: [birth year]  Today's Date: 03/25/2017 PT Individual Time: 0830-0900 PT Individual Time Calculation (min): 30 min   Short Term Goals: Week 2:  PT Short Term Goal 1 (Week 2): Pt will perform bed mobility with consistent S PT Short Term Goal 2 (Week 2): Pt will perform transfers w/c <>bed with consistent S PT Short Term Goal 3 (Week 2): Pt will propel w/c x150' with S  PT Short Term Goal 4 (Week 2): Pt will perform gait x10' with maxA  Skilled Therapeutic Interventions/Progress Updates: Pt received seated in recliner, c/o pain as below and agreeable to treatment. TEDs, AFOs and shoes donned totalA. Squat pivot transfer recliner>w/c with minA slight uphill transfer. W/c propulsion x75' with BUE before fatigued; pt reports UEs "more tired than normal". Squat pivot transfer uphill to mat table; therapist performed demonstration prior to transfer with exaggerated head/hips relationship. Sit <>stand x4 from mat table with RW and min/modA; cues for reduced reliance on UEs once in standing with palpable increase in quad activation BLE. Stand pivot transfer mat>w/c with RW and minA for LLE stance control and min cues for technique and management of RW. Remained seated in w/c in gym with handoff to OT for next session.        Therapy Documentation Precautions:  Precautions Precautions: Fall Precaution Comments: paraparesis Restrictions Weight Bearing Restrictions: No Pain: Pain Assessment Pain Assessment: 0-10 Pain Score: 3  Pain Location: Back Pain Orientation: Lower;Left;Right;Mid Pain Descriptors / Indicators: Aching;Constant Pain Onset: Progressive Patients Stated Pain Goal: 2 Pain Intervention(s): Medication (See eMAR)   See Function Navigator for Current Functional Status.   Therapy/Group: Individual Therapy  Vista Lawmanlizabeth J Tygielski 03/25/2017, 10:40 AM

## 2017-03-25 NOTE — Progress Notes (Signed)
      [address]       [name] [id number]             [phone]                     LOS: 10 days   Subjective: Over all feels better, encouraged with his rehab  Objective: Vital signs in last 24 hours: Patient Vitals for the past 24 hrs:  BP Temp Temp src Pulse Resp SpO2  03/25/17 1549 106/61 98.1 F (36.7 C) Oral 93 16 -  03/25/17 0230 120/79 98.2 F (36.8 C) Oral 94 10 100 %    Filed Weights   03/22/17 0321 03/23/17 0500 03/24/17 0500  Weight: 161 lb 2.5 oz (73.1 kg) 148 lb 9.4 oz (67.4 kg) 150 lb 12.7 oz (68.4 kg)    Hemodynamic parameters for last 24 hours:    Intake/Output from previous day: 11/29 0701 - 11/30 0700 In: 600 [P.O.:600] Out: -  Intake/Output this shift: Total I/O In: 960 [P.O.:960] Out: -   Scheduled Meds: . aspirin EC  81 mg Oral Daily  . bisacodyl  10 mg Rectal Q0600  . metoprolol tartrate  12.5 mg Oral BID  . morphine  15 mg Oral Q12H  . pantoprazole  40 mg Oral Daily  . senna-docusate  2 tablet Oral QHS  . traZODone  100 mg Oral QHS   Continuous Infusions: PRN Meds:.acetaminophen **OR** acetaminophen, alum & mag hydroxide-simeth, HYDROcodone-acetaminophen, ondansetron **OR** ondansetron (ZOFRAN) IV, sorbitol   Lab Results: CBC:No results for input(s): WBC, HGB, HCT, PLT in the last 72 hours. BMET: No results for input(s): NA, K, CL, CO2, GLUCOSE, BUN, CREATININE, CALCIUM in the last 72 hours.  PT/INR: No results for input(s): LABPROT, INR in the last 72 hours.   Radiology No results found.   Assessment/Plan: See some improvement in his lower extremity function compared to admission   Delight OvensEdward B Deshon Koslowski MD 03/25/2017 4:12 PM     Patient ID: Victor [name], male   DOB: 1955-09-01, 61 y.o.   MRN: [id number]

## 2017-03-25 NOTE — Progress Notes (Signed)
Occupational Therapy Session Note  Patient Details  Name: Victor [name] MRN: [id number] Date of Birth: May 07, 1955  Today's Date: 03/25/2017 OT Individual Time: 1130-1200 OT Individual Time Calculation (min): 30 min    Short Term Goals: Week 1:  OT Short Term Goal 1 (Week 1): Pt will perform toilet transfer with mod A. OT Short Term Goal 1 - Progress (Week 1): Met OT Short Term Goal 2 (Week 1): Pt will perform clothing management after toileting with min A for balance. OT Short Term Goal 2 - Progress (Week 1): Progressing toward goal OT Short Term Goal 3 (Week 1): Pt will perform dynamic functional tasks while seated on EOB with min A for balance.  OT Short Term Goal 3 - Progress (Week 1): Met Week 2:  OT Short Term Goal 1 (Week 2): Pt will perform clothing management after toileting with min A for balance. OT Short Term Goal 2 (Week 2): Pt will perform LB dressing tasks with mod A with sit<>stand from w/c/EOB OT Short Term Goal 3 (Week 2): Pt will perform toilet transfers with min A and LRAD  Skilled Therapeutic Interventions/Progress Updates:    1:1 Focus on squat pivot transfers on/ off couch and on/ mat with min A.  Off the couch, back into the w/c was up hill and able to complete in two transfers with min A. Also educated and practiced mechanics (doff/ donning leg rest) and removal of arm rest in prep for transfers with mod to max VC- will still need to practice.   Therapy Documentation Precautions:  Precautions Precautions: Fall Precaution Comments: paraparesis Restrictions Weight Bearing Restrictions: No Pain: No c/o pain  See Function Navigator for Current Functional Status.   Therapy/Group: Individual Therapy  Willeen [facility] 03/25/2017, 3:51 PM

## 2017-03-25 NOTE — Progress Notes (Signed)
[place] PHYSICAL MEDICINE & REHABILITATION     PROGRESS NOTE    Subjective/Complaints: Slept well again last night.  No new complaints.  Pain under control.  Empty bowels this morning voluntarily  ROS: pt denies nausea, vomiting, diarrhea, cough, shortness of breath or chest pain   Objective: Vital Signs: Blood pressure 120/79, pulse 94, temperature 98.2 F (36.8 C), temperature source Oral, resp. rate 10, height 5\' 7"  (1.702 m), weight 68.4 kg (150 lb 12.7 oz), SpO2 100 %. No results found. No results for input(s): WBC, HGB, HCT, PLT in the last 72 hours. No results for input(s): NA, K, CL, GLUCOSE, BUN, CREATININE, CALCIUM in the last 72 hours.  Invalid input(s): CO CBG (last 3)  No results for input(s): GLUCAP in the last 72 hours.  Wt Readings from Last 3 Encounters:  03/24/17 68.4 kg (150 lb 12.7 oz)  03/14/17 76.5 kg (168 lb 10.4 oz)  02/22/17 69.9 kg (154 lb)    Physical Exam:  Constitutional: He appearswell-developedand well-nourished.No distress.  HENT:  Head:Normocephalic.  Eyes:EOMare normal.  Neck:Normal range of motion.Neck supple.No thyromegalypresent.  Cardiovascular: RRR without murmur. No JVD     Respiratory:CTA Bilaterally without wheezes or rales. Normal effort  NW:GNFAGI:Soft.Bowel sounds are normal. He exhibitsno distension.  Genitourinary:  Skin.   Intact  neurological. Cognitively appropriate. No gross nystagmus seen with confrontation. UE 5/5 bilaterally.Bilateral hip flexion is 1+ to 2- out of 5 knee extension  1+ out of 5 hip abduction and abduction are 1+ out of 5. Bilateral ankle dorsiflexion and plantar flexion are 2 to 2+out of 5.decr sensation below chest.    Motor/sensory exam essentially unchanged  psychiatric: flat and not too engaged     Assessment/Plan: 1. 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 Physiatrist is providing close team supervision and 24 hour management of active medical problems listed below. Physiatrist and rehab team continue to assess barriers to discharge/monitor patient progress toward functional and medical goals.  Function:  Bathing Bathing position Bathing activity did not occur: N/A Position: Shower  Bathing parts Body parts bathed by patient: Right arm, Left arm, Chest, Abdomen, Front perineal area, Right upper leg, Left upper leg Body parts bathed by helper: Buttocks, Right lower leg, Left lower leg  Bathing assist Assist Level: Touching or steadying assistance(Pt > 75%)      Upper Body Dressing/Undressing Upper body dressing Upper body dressing/undressing activity did not occur: N/A What is the patient wearing?: Button up shirt     Pull over shirt/dress - Perfomed by patient: Thread/unthread right sleeve, Thread/unthread left sleeve, Put head through opening, Pull shirt over trunk   Button up shirt - Perfomed by patient: Thread/unthread right sleeve, Thread/unthread left sleeve, Pull shirt around back, Button/unbutton shirt      Upper body assist Assist Level: Supervision or verbal cues      Lower Body Dressing/Undressing Lower body dressing Lower body dressing/undressing activity did not occur: N/A What is the patient wearing?: Pants, Non-skid slipper socks     Pants- Performed by patient: Thread/unthread right pants leg Pants- Performed by helper: Thread/unthread left pants leg, Pull pants up/down Non-skid slipper socks- Performed by patient: Don/doff right sock, Don/doff left sock Non-skid slipper socks- Performed by helper: Don/doff right sock, Don/doff left sock               TED Hose - Performed by helper: Don/doff right TED hose, Don/doff left  TED hose  Lower body assist Assist for lower body dressing: (MAX A)      Toileting Toileting Toileting activity did not occur: No continent bowel/bladder event   Toileting steps  completed by helper: Adjust clothing prior to toileting, Performs perineal hygiene, Adjust clothing after toileting(per Candice Applewhite, NT)    Toileting assist     Transfers Chair/bed transfer   Chair/bed transfer method: Squat pivot Chair/bed transfer assist level: Supervision or verbal cues Chair/bed transfer assistive device: Armrests     Locomotion Ambulation     Max distance: 5' Assist level: 2 helpers   Wheelchair   Type: Manual Max wheelchair distance: 100' Assist Level: Supervision or verbal cues  Cognition Comprehension Comprehension assist level: Follows complex conversation/direction with no assist  Expression Expression assist level: Expresses complex ideas: With no assist  Social Interaction Social Interaction assist level: Interacts appropriately with others - No medications needed.  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 Status post thrombectomy of left brachial artery, unsuccessful despite multiple attempts by vascular surgery 03/08/2017 as well as recent aortic arch replacement followed byTEVARprocedure 01/21/2017 -Continue physical and occupational therapies.     2. DVT Prophylaxis/Anticoagulation: SCDs.  Dopplers performed yesterday and negative 3. Pain Management:Hydrocodone as needed  -continue MS Contin 15 mg every 12 hours with good results thus far  4. Mood:Provide emotional support. Neuro-psych notes reviewed   -continued scheduled trazodone to help with sleep 5. Neuropsych: This patientiscapable of making decisions on hisown behalf. 6. Skin/Wound Care:Routine skin checks 7. Fluids/Electrolytes/Nutrition:I personally reviewed the patient's labs today.   Marland Kitchen.   8.Hypertension.   Lopressor 12.5 mg twice a day.     -contine lopressor, holding Cozaar 25 mg   -Blood pressure reasonable at present 9.Neurogenic bowel and bladder. Appears to have general bowel/bladder control. Continue softeners/laxative   -oob to void   -am suppository prn 10. Fever: afebrile now   -Urine culture with multi species. Completed 5 days amoxil   LOS (Days) 10 A FACE TO FACE EVALUATION WAS PERFORMED  Ranelle OysterSWARTZ,Audrinna Sherman T, MD 03/25/2017 9:54 AM

## 2017-03-25 NOTE — Progress Notes (Signed)
Occupational Therapy Session Note  Patient Details  Name: Victor [name] MRN: [id number] Date of Birth: [birth year]  Today's Date: 03/25/2017 OT Individual Time: 1015-1059 OT Individual Time Calculation (min): 44 min    Short Term Goals: Week 1:  OT Short Term Goal 1 (Week 1): Pt will perform toilet transfer with mod A. OT Short Term Goal 1 - Progress (Week 1): Met OT Short Term Goal 2 (Week 1): Pt will perform clothing management after toileting with min A for balance. OT Short Term Goal 2 - Progress (Week 1): Progressing toward goal OT Short Term Goal 3 (Week 1): Pt will perform dynamic functional tasks while seated on EOB with min A for balance.  OT Short Term Goal 3 - Progress (Week 1): Met  Skilled Therapeutic Interventions/Progress Updates:    1:1. Pt requesting to brush teeth before exiting with RN administering pain medication upon entering. Pt brushes teeth with set up. Pt propels w/c to/from all tx destinations with increased time and Vc for steering. Pt sit to stand in kitchen x10 at sink while loading dishes into cabinets with min A for balance, VC for posture, knee extension, controlled sit and hand placement on w/c to push up to standing. Pt with 3 partial stands 2/2 to RLE giving out. Exited session with pt seated in w/c with call light in reach and all needs met.  Therapy Documentation Precautions:  Precautions Precautions: Fall Precaution Comments: paraparesis Restrictions Weight Bearing Restrictions: No  See Function Navigator for Current Functional Status.   Therapy/Group: Individual Therapy  Tonny Branch 03/25/2017, 10:56 AM

## 2017-03-25 NOTE — Progress Notes (Addendum)
Occupational Therapy Session Note  Patient Details  Name: Victor ChristmasMarcus E Senk MRN: [id number] Date of Birth: 10-May-1955  Today's Date: 03/25/2017 OT Individual Time: [id number] OT Individual Time Calculation (min): 30 min    Short Term Goals: Week 2:  OT Short Term Goal 1 (Week 2): Pt will perform clothing management after toileting with min A for balance. OT Short Term Goal 2 (Week 2): Pt will perform LB dressing tasks with mod A with sit<>stand from w/c/EOB OT Short Term Goal 3 (Week 2): Pt will perform toilet transfers with min A and LRAD  Skilled Therapeutic Interventions/Progress Updates:    Pt presented seated in therapy gym having just finished PT session. [facility] Pt with increased fatigue and increased difficulty rising to standing. Completed squat pivot to mat table with MinA. Pt engaged in dynamic sitting activity of horseshoes, reaching across midline and outside BOS to obtain and toss horseshoes, alternating reaching with LUE and RUE. Pt requires single UE support during seated activity and intermittent minA for dynamic balance. Pt returned to w/c, requiring ModA to fully complete transfer. Pt becoming tearful due to decreased independence/success with transfer completion. Pt easily consolable and wishing to continue with session. Pt propels w/c approx 50% distance back to room for increased strengthening/edurance with therapist assisting with remaining distance. Pt completed seated grooming ADLs in w/c with setup assist.  Pt left seated in w/c, call bell and needs within reach.   Therapy Documentation Precautions:  Precautions Precautions: Fall Precaution Comments: paraparesis Restrictions Weight Bearing Restrictions: No  Pain: Pain Assessment Pain Assessment: No/denies pain Pain Score: 3  Pain Location: Back Pain Orientation: Lower;Left;Right;Mid Pain Descriptors / Indicators: Aching;Constant Pain Onset: Progressive Patients Stated Pain Goal:  2 Pain Intervention(s): Medication (See eMAR)  See Function Navigator for Current Functional Status.   Therapy/Group: Individual Therapy  Orlando PennerBreanna L Salif Tay 03/25/2017, 10:44 AM

## 2017-03-26 ENCOUNTER — Inpatient Hospital Stay (HOSPITAL_COMMUNITY): Payer: PRIVATE HEALTH INSURANCE

## 2017-03-26 NOTE — Progress Notes (Signed)
  [place] PHYSICAL MEDICINE & REHABILITATION     PROGRESS NOTE    Subjective/Complaints: Feels well, no complaints  Objective: Vital Signs: Blood pressure 126/79, pulse (!) 109, temperature 98.5 F (36.9 C), temperature source Oral, resp. rate 18, height 5\' 7"  (1.702 m), weight 151 lb 10.8 oz (68.8 kg), SpO2 99 %.   Physical Exam:  nad Chest, cta cv- reg rate abd- soft, nt,  Ext- no edema  Assessment/Plan: 1. 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 Status post thrombectomy of left brachial artery, unsuccessful despite multiple attempts by vascular surgery 03/08/2017 as well as recent aortic arch replacement followed byTEVARprocedure 01/21/2017 -Continue physical and occupational therapies.     2. DVT Prophylaxis/Anticoagulation: SCDs.  Dopplers performed yesterday and negative 3. Pain Management:Hydrocodone as needed  -continue MS Contin 15 mg every 12 hours with good results thus far  4. Mood:Provide emotional support. Neuro-psych notes reviewed   -continued scheduled trazodone to help with sleep 5. Neuropsych: This patientiscapable of making decisions on hisown behalf. 6. Skin/Wound Care:Routine skin checks 7. Fluids/Electrolytes/Nutrition: Basic Metabolic Panel:    Component Value Date/Time   NA 137 03/16/2017 0710   NA 142 02/17/2017 0955   K 4.1 03/16/2017 0710   CL 103 03/16/2017 0710   CO2 27 03/16/2017 0710   BUN 12 03/16/2017 0710   BUN 11 02/17/2017 0955   CREATININE 1.13 03/16/2017 0710   CREATININE 1.14 02/03/2015 0936   GLUCOSE 95 03/16/2017 0710   CALCIUM 8.6 (L) 03/16/2017 0710   .   Marland Kitchen.  8.Hypertension.    106/61-119/72 9.Neurogenic bowel and bladder.  10. Fever: afebrile now    LOS (Days) 11 A FACE TO FACE EVALUATION WAS PERFORMED  Lindley MagnusBruce H Kawana Hegel, MD 03/26/2017 10:12 AM

## 2017-03-26 NOTE — Plan of Care (Signed)
  Progressing Consults RH SPINAL CORD INJURY PATIENT EDUCATION Description  See Patient Education module for education specifics.  03/26/2017 1723 - Progressing by Melina ModenaBurchett, Jaice Digioia, RN SCI BOWEL ELIMINATION RH STG MANAGE BOWEL WITH ASSISTANCE Description STG Manage Bowel with mod I Assistance.  03/26/2017 1723 - Progressing by Melina ModenaBurchett, Anniya Whiters, RN RH STG SCI MANAGE BOWEL WITH MEDICATION WITH ASSISTANCE Description STG SCI Manage bowel with medication with mod I assistance.  03/26/2017 1723 - Progressing by Melina ModenaBurchett, Jerard Bays, RN RH STG SCI MANAGE BOWEL PROGRAM W/ASSIST OR AS APPROPRIATE Description STG SCI Manage bowel program w/assist or as mod I appropriate.  03/26/2017 1723 - Progressing by Melina ModenaBurchett, Shelie Lansing, RN SCI BLADDER ELIMINATION RH STG MANAGE BLADDER WITH ASSISTANCE Description STG Manage Bladder With  Min assist Assistance  03/26/2017 1723 - Progressing by Melina ModenaBurchett, Hilde Churchman, RN RH SAFETY RH STG ADHERE TO SAFETY PRECAUTIONS W/ASSISTANCE/DEVICE Description STG Adhere to Safety Precautions With  Cues/ reminders Assistance/Device.  03/26/2017 1723 - Progressing by Melina ModenaBurchett, Alta Shober, RN RH PAIN MANAGEMENT RH STG PAIN MANAGED AT OR BELOW PT'S PAIN GOAL Description Pain at or below level 5 with prn medications  03/26/2017 1723 - Progressing by Melina ModenaBurchett, Shandon Matson, RN RH KNOWLEDGE DEFICIT SCI RH STG INCREASE KNOWLEDGE OF SELF CARE AFTER SCI Description Patient will be able to coordinate care and guide family in how to take care of him and address needs at discharge using cues/reminders  03/26/2017 1723 - Progressing by Melina ModenaBurchett, Ariannie Penaloza, RN

## 2017-03-26 NOTE — Progress Notes (Signed)
Occupational Therapy Session Note  Patient Details  Name: ABDULHAMID OLGIN MRN: [id number] Date of Birth: June 18, 1955  Today's Date: 03/26/2017 OT Individual Time: 3143-8887 OT Individual Time Calculation (min): 55 min    Short Term Goals: Week 2:  OT Short Term Goal 1 (Week 2): Pt will perform clothing management after toileting with min A for balance. OT Short Term Goal 2 (Week 2): Pt will perform LB dressing tasks with mod A with sit<>stand from w/c/EOB OT Short Term Goal 3 (Week 2): Pt will perform toilet transfers with min A and LRAD  Skilled Therapeutic Interventions/Progress Updates:    1:1. Pt requesting to finish breakfast  While seated in recliner while OT makes cup of coffee. Pt squat pivot transfer recliner to w/c with MIN A for buttock clearance over rail with VC for foot placement. Pt shaves at sink with set up and washes UB at sink with set up.  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 Pt propels w/c to gather clothing out of dresser with VC to lock brakes prior to reaching in w/c for clothing in dresser/closet. Pt dons pull over shirt with set up and wrap around sweater with A to bring around back. OT dons ted hose and non skid socks. Exited session with pt seated in w/c with call light in reach and all needs met  Therapy Documentation Precautions:  Precautions Precautions: Fall Precaution Comments: paraparesis Restrictions Weight Bearing Restrictions: No General:   Vital Signs:   Pain:   ADL:   Vision   Perception    Praxis   Exercises:   Other Treatments:    See Function Navigator for Current Functional Status.   Therapy/Group: Individual Therapy  Tonny Branch 03/26/2017, 8:28 AM

## 2017-03-27 ENCOUNTER — Inpatient Hospital Stay (HOSPITAL_COMMUNITY): Payer: PRIVATE HEALTH INSURANCE

## 2017-03-27 NOTE — Progress Notes (Signed)
Occupational Therapy Session Note  Patient Details  Name: Victor [name] MRN: [id number] Date of Birth: 08-18-1955  Today's Date: 03/27/2017 OT Individual Time: 6789-3810 OT Individual Time Calculation (min): 60 min    Short Term Goals: Week 2:  OT Short Term Goal 1 (Week 2): Pt will perform clothing management after toileting with min A for balance. OT Short Term Goal 2 (Week 2): Pt will perform LB dressing tasks with mod A with sit<>stand from w/c/EOB OT Short Term Goal 3 (Week 2): Pt will perform toilet transfers with min A and LRAD  Skilled Therapeutic Interventions/Progress Updates:    1;1 No pain reported. Mother present throughout session. Pt squat pivot transfer throughout session with touching A and VC for weight shift anteriorly and BLE management. OT dons teds and B shoes. Demo how to strap AFO on R shoe, pt able to return demo with cues on L AFO. Pt sit to stand at high low table x4 with MOD A for lifting and L knee blocking. Pt able to maintain standing to play checkers with MIN A for balance and VC for hip extension and upright posture. Pt scoots laterally length of mat x2 for practice managing legs in prep for transfers with VC to use BUE to lift/lower legs to the side. Pt assumes sidelying on mat with min A to completes hip and knee flexion/extension on powderboard for gravity eliminated strengthening with mod A for LLE hip/knee flexion. Exited session with pt seated in w/c with call light in reach and all needs met. Therapy Documentation Precautions:  Precautions Precautions: Fall Precaution Comments: paraparesis Restrictions Weight Bearing Restrictions: No  See Function Navigator for Current Functional Status.   Therapy/Group: Individual Therapy  Tonny Branch 03/27/2017, 6:58 AM

## 2017-03-27 NOTE — Progress Notes (Signed)
  [place] PHYSICAL MEDICINE & REHABILITATION     PROGRESS NOTE    Subjective/Complaints: Feels well.  No significant complaints.  He has mild bilateral leg discomfort but he thinks that is improving.  Objective: Vital Signs: Blood pressure (!) 104/52, pulse 84, temperature 98.2 F (36.8 C), temperature source Oral, resp. rate 18, height 5\' 7"  (1.702 m), weight 151 lb 10.8 oz (68.8 kg), SpO2 99 %.   Physical Exam:  No acute distress.  Chest clear to auscultation.  Cardiac exam S1 and S2 are regular.  Abdominal exam active bowel sounds, soft, nontender extremities 1+ lower extremity edema.  Assessment/Plan: 1. 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 Status post thrombectomy of left brachial artery, unsuccessful despite multiple attempts by vascular surgery 03/08/2017 as well as recent aortic arch replacement followed byTEVARprocedure 01/21/2017 -Continue physical and occupational therapies.     2. DVT Prophylaxis/Anticoagulation: SCDs.  Dopplers performed yesterday and negative 3. Pain Management:Hydrocodone as needed  -continue MS Contin 15 mg every 12 hours with good results thus far  4. Mood:Provide emotional support. Neuro-psych notes reviewed   -continued scheduled trazodone to help with sleep 5. Neuropsych: This patientiscapable of making decisions on hisown behalf. 6. Skin/Wound Care:Routine skin checks 7. Fluids/Electrolytes/Nutrition: Basic Metabolic Panel:    Component Value Date/Time   NA 137 03/16/2017 0710   NA 142 02/17/2017 0955   K 4.1 03/16/2017 0710   CL 103 03/16/2017 0710   CO2 27 03/16/2017 0710   BUN 12 03/16/2017 0710   BUN 11 02/17/2017 0955   CREATININE 1.13 03/16/2017 0710   CREATININE 1.14 02/03/2015 0936   GLUCOSE 95 03/16/2017 0710   CALCIUM 8.6 (L) 03/16/2017 0710   .   Marland Kitchen.  8.Hypertension.    108/62-126/79 9.Neurogenic bowel and bladder.  10. Fever: afebrile now    LOS (Days) 12 A FACE TO FACE EVALUATION WAS PERFORMED  Lindley MagnusBruce H Kinga Cassar, MD 03/27/2017 8:43 AM

## 2017-03-28 ENCOUNTER — Inpatient Hospital Stay (HOSPITAL_COMMUNITY): Payer: PRIVATE HEALTH INSURANCE | Admitting: Physical Therapy

## 2017-03-28 ENCOUNTER — Inpatient Hospital Stay (HOSPITAL_COMMUNITY): Payer: PRIVATE HEALTH INSURANCE

## 2017-03-28 NOTE — Progress Notes (Signed)
[place] PHYSICAL MEDICINE & REHABILITATION     PROGRESS NOTE    Subjective/Complaints: No new issues. Had good weekend. Sleep is fair  ROS: pt denies nausea, vomiting, diarrhea, cough, shortness of breath or chest pain   Objective: Vital Signs: Blood pressure 118/66, pulse (!) 105, temperature 98.4 F (36.9 C), temperature source Oral, resp. rate 18, height 5\' 7"  (1.702 m), weight 69.1 kg (152 lb 5.4 oz), SpO2 100 %. No results found. No results for input(s): WBC, HGB, HCT, PLT in the last 72 hours. No results for input(s): NA, K, CL, GLUCOSE, BUN, CREATININE, CALCIUM in the last 72 hours.  Invalid input(s): CO CBG (last 3)  No results for input(s): GLUCAP in the last 72 hours.  Wt Readings from Last 3 Encounters:  03/28/17 69.1 kg (152 lb 5.4 oz)  03/14/17 76.5 kg (168 lb 10.4 oz)  02/22/17 69.9 kg (154 lb)    Physical Exam:  Constitutional: He appearswell-developedand well-nourished.No distress.  HENT:  Head:Normocephalic.  Eyes:EOMare normal.  Neck:Normal range of motion.Neck supple.No thyromegalypresent.  Cardiovascular:RRR without murmur. No JVD       Respiratory:CTA Bilaterally without wheezes or rales. Normal effort   RU:EAVWGI:Soft.Bowel sounds are normal. He exhibitsno distension.  Genitourinary:  Skin.   Intact  neurological. Cognitively appropriate. No gross nystagmus seen with confrontation. UE 5/5 bilaterally.Bilateral hip flexion is 1+ to 2- out of 5 knee extension  1+ out of 5 hip abduction and abduction are 1+ out of 5. Bilateral ankle dorsiflexion and plantar flexion are 2 to 2+out of 5.decr sens below mid chest.   psychiatric: flat, somewhat engaged today     Assessment/Plan: 1. 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 Physiatrist is providing close team supervision and 24 hour management of active medical problems  listed below. Physiatrist and rehab team continue to assess barriers to discharge/monitor patient progress toward functional and medical goals.  Function:  Bathing Bathing position Bathing activity did not occur: N/A Position: Shower  Bathing parts Body parts bathed by patient: Right arm, Left arm, Chest, Abdomen, Front perineal area, Right upper leg, Left upper leg Body parts bathed by helper: Buttocks, Right lower leg, Left lower leg  Bathing assist Assist Level: Touching or steadying assistance(Pt > 75%)      Upper Body Dressing/Undressing Upper body dressing Upper body dressing/undressing activity did not occur: N/A What is the patient wearing?: Button up shirt     Pull over shirt/dress - Perfomed by patient: Thread/unthread right sleeve, Thread/unthread left sleeve, Put head through opening, Pull shirt over trunk   Button up shirt - Perfomed by patient: Thread/unthread right sleeve, Thread/unthread left sleeve, Pull shirt around back, Button/unbutton shirt      Upper body assist Assist Level: Supervision or verbal cues      Lower Body Dressing/Undressing Lower body dressing Lower body dressing/undressing activity did not occur: N/A What is the patient wearing?: Pants, Non-skid slipper socks     Pants- Performed by patient: Thread/unthread right pants leg Pants- Performed by helper: Thread/unthread left pants leg, Pull pants up/down Non-skid slipper socks- Performed by patient: Don/doff right sock, Don/doff left sock Non-skid slipper socks- Performed by helper: Don/doff right sock, Don/doff left sock               TED Hose - Performed by helper: Don/doff right TED hose, Don/doff left TED hose  Lower body assist Assist for lower body dressing: (MAX A)  Toileting Toileting Toileting activity did not occur: N/A   Toileting steps completed by helper: Adjust clothing prior to toileting, Adjust clothing after toileting, Performs perineal hygiene    Toileting  assist     Transfers Chair/bed transfer   Chair/bed transfer method: Other(stedy) Chair/bed transfer assist level: Touching or steadying assistance (Pt > 75%) Chair/bed transfer assistive device: Walker, Orthosis     Locomotion Ambulation     Max distance: 10 ft Assist level: Touching or steadying assistance (Pt > 75%)   Wheelchair   Type: Manual Max wheelchair distance: 75 ft Assist Level: Supervision or verbal cues  Cognition Comprehension Comprehension assist level: Understands complex 90% of the time/cues 10% of the time  Expression Expression assist level: Expresses complex ideas: With extra time/assistive device  Social Interaction Social Interaction assist level: Interacts appropriately with others with medication or extra time (anti-anxiety, antidepressant).  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 Status post thrombectomy of left brachial artery, unsuccessful despite multiple attempts by vascular surgery 03/08/2017 as well as recent aortic arch replacement followed byTEVARprocedure 01/21/2017 -Continue physical and occupational therapies. Making functional gains     2. DVT Prophylaxis/Anticoagulation: SCDs.  Dopplers performed yesterday and negative 3. Pain Management:Hydrocodone as needed  -continue MS Contin 15 mg every 12 hours with good results thus far  4. Mood:Provide emotional support. Neuro-psych notes reviewed   -continued scheduled trazodone to help with sleep 5. Neuropsych: This patientiscapable of making decisions on hisown behalf. 6. Skin/Wound Care:Routine skin checks 7. Fluids/Electrolytes/Nutrition:I personally reviewed the patient's labs today.   Marland Kitchen.  8.Hypertension.   Lopressor 12.5 mg twice a day.     -contine  lopressor, holding Cozaar 25 mg   -Blood pressure reasonable 12/3 9.Neurogenic bowel and bladder. Appears to have  bowel/bladder control. Continue softeners/laxative   -oob to void   -am suppository prn 10. Fever: afebrile now   -Urine culture with multi species. Completed 5 days amoxil   LOS (Days) 13 A FACE TO FACE EVALUATION WAS PERFORMED  Ranelle OysterSWARTZ,ZACHARY T, MD 03/28/2017 8:54 AM

## 2017-03-28 NOTE — Progress Notes (Signed)
Physical Therapy Session Note  Patient Details  Name: Victor ChristmasMarcus E Well MRN: [id number] Date of Birth: 1955/09/24  Today's Date: 03/28/2017 PT Individual Time:  13:45-14:15 PT Minutes: 30 minutes     Short Term Goals: Week 2:  PT Short Term Goal 1 (Week 2): Pt will perform bed mobility with consistent S PT Short Term Goal 2 (Week 2): Pt will perform transfers w/c <>bed with consistent S PT Short Term Goal 3 (Week 2): Pt will propel w/c x150' with S  PT Short Term Goal 4 (Week 2): Pt will perform gait x10' with maxA      Skilled Therapeutic Interventions/Progress Updates:  Pt seated in recliner in room, agreeable to participate in therapy session. Pt reports no pain this PM. Donned BLE shoes and AFO dependently. Squat pivot transfer recliner to w/c with SBA. Manual w/c propulsion 2 x 100 ft Supervision. Sit to stand to RW with Min Assist, stand pivot transfer w/c to/from mat with Min Assist and RW, v/c for hand placement and for upright posture. Second transfer back to w/c performed with LLE AFO only, RLE AFO removed as knee control observed to be improving. Measured pt for leg lifter straps. Pt left seated in w/c in room with needs in reach, family in room.     Therapy Documentation Precautions:  Precautions Precautions: Fall Precaution Comments: paraparesis Restrictions Weight Bearing Restrictions: No   See Function Navigator for Current Functional Status.   Therapy/Group: Individual Therapy  Peter Congoaylor  [place] 03/28/2017, 3:13 PM

## 2017-03-28 NOTE — Progress Notes (Signed)
Occupational Therapy Note  Patient Details  Name: Victor [name] MRN: [id number] Date of Birth: 11-Oct-1955  Today's Date: 03/28/2017 OT Individual Time: 0700-0755 OT Individual Time Calculation (min): 55 min   Pt c/o tightness in BLE (calves); repositioned and stretching Individual Therapy  Focus on sitting balance, sit<>stand, BLE management, BUE strengthening, standing balance, and activity tolerance to increase independence with BADLs.  Pt performed sit<>stand in Stedy X 5.  Pt continues to require BUE support when standing in [place].  Additional focus on trial of removing one hand from support to initiate being able to pull up pants and bathe buttocks.  Pt remained seated in recliner with all needs within reach.    [name], [name] [facility]appell 03/28/2017, 8:02 AM

## 2017-03-28 NOTE — Progress Notes (Signed)
Occupational Therapy Session Note  Patient Details  Name: Victor [name] MRN: [id number] Date of Birth: 1956/03/26  Today's Date: 03/28/2017 OT Individual Time: 1100-1155 OT Individual Time Calculation (min): 55 min    Short Term Goals: Week 2:  OT Short Term Goal 1 (Week 2): Pt will perform clothing management after toileting with min A for balance. OT Short Term Goal 2 (Week 2): Pt will perform LB dressing tasks with mod A with sit<>stand from w/c/EOB OT Short Term Goal 3 (Week 2): Pt will perform toilet transfers with min A and LRAD  Skilled Therapeutic Interventions/Progress Updates:    Pt engaged in BADL retraining including bathing at shower level and dressing with sit<>stand from recliner Antony Salmon(Stedy).  Pt remained seated on Stedy for bathing secondary to hernia positioning.  Pt able to completed UB bathing tasks while seated in [place] with sit<>stand X 7 during activity.  Pt required assistance bathing buttocks secondary pt required BUE support when standing.  Pt returned to recliner to complete dressing tasks.  Pt requires assistance threading and pulling up pants.  Focus on sit<>stand with device, sitting balance, standing balance in [place], activity tolerance, and safety awareness to increase independence with BADLs.   Therapy Documentation Precautions:  Precautions Precautions: Fall Precaution Comments: paraparesis Restrictions Weight Bearing Restrictions: No Pain: Pt denies pain See Function Navigator for Current Functional Status.   Therapy/Group: Individual Therapy  Rich BraveLanier, Aarohi Redditt Chappell 03/28/2017, 11:58 AM

## 2017-03-28 NOTE — Progress Notes (Signed)
Physical Therapy Session Note  Patient Details  Name: Victor ChristmasMarcus E Birks MRN: [id number] Date of Birth: [birth year]  Today's Date: 03/28/2017 PT Individual Time: 0900-1000 PT Individual Time Calculation (min): 60 min   Short Term Goals: Week 2:  PT Short Term Goal 1 (Week 2): Pt will perform bed mobility with consistent S PT Short Term Goal 2 (Week 2): Pt will perform transfers w/c <>bed with consistent S PT Short Term Goal 3 (Week 2): Pt will propel w/c x150' with S  PT Short Term Goal 4 (Week 2): Pt will perform gait x10' with maxA  Skilled Therapeutic Interventions/Progress Updates: Pt received seated in recliner, c/o headache pre-medicated, and agreeable to treatment. B non-skid socks doffed with setupA for use of reacher. B AFOs and shoes donned totalA. Sit >stand minA with RW; stand pivot transfer minA for pivotal steps d/t poor foot clearance. Seated in w/c at sink pt performs oral hygiene and use of urinal with modI. W/c propulsion x100' BUE with S until fatigued. Gait with RW 3x5-10' per trial with minA for LLE placement and stance control; modA during pivotal steps to turn and sit at chair at end of trial. Squat pivot transfer w/c <>mat table with min guard. Sit <>stand minA with RW; pt able to demo <5 sec no UE support with tactile cueing at B glutes and L quad. Sit <>supine modA for LE management. AAROM heel slides x10 reps BLE. Bridging 2 sets 7 reps to fatigue, AAROM to maintain BLEs in neutral alignment. Returned to w/c squat pivot as above. Transported to room totalA. Squat pivot transfer to recliner min guard. Remained seated in recliner, all needs in reach. RN alerted to pt request for pain medication.    Therapy Documentation Precautions:  Precautions Precautions: Fall Precaution Comments: paraparesis Restrictions Weight Bearing Restrictions: No   See Function Navigator for Current Functional Status.   Therapy/Group: Individual Therapy  Vista Lawmanlizabeth J Tygielski 03/28/2017,  10:05 AM

## 2017-03-29 ENCOUNTER — Inpatient Hospital Stay (HOSPITAL_COMMUNITY): Payer: PRIVATE HEALTH INSURANCE | Admitting: Physical Therapy

## 2017-03-29 ENCOUNTER — Inpatient Hospital Stay (HOSPITAL_COMMUNITY): Payer: PRIVATE HEALTH INSURANCE

## 2017-03-29 NOTE — Progress Notes (Signed)
Physical Therapy Session Note  Patient Details  Name: Victor ChristmasMarcus E Mink MRN: [id number] Date of Birth: 1955/06/18  Today's Date: 03/29/2017 PT Individual Time: 1300-1330 PT Individual Time Calculation (min): 30 min   Short Term Goals: Week 2:  PT Short Term Goal 1 (Week 2): Pt will perform bed mobility with consistent S PT Short Term Goal 2 (Week 2): Pt will perform transfers w/c <>bed with consistent S PT Short Term Goal 3 (Week 2): Pt will propel w/c x150' with S  PT Short Term Goal 4 (Week 2): Pt will perform gait x10' with maxA  Skilled Therapeutic Interventions/Progress Updates:    Pt seated in w/c upon therapist arrival, pt is agreeable to participate in therapy session. Pt reports 6/10 pain in low back and B LE, nursing administers pain medication during therapy session and pt reports pain is 4/10 at end of therapy session. Manual w/c propulsion 2 x 150 ft Supervision. Lateral scoot transfer w/c to/from mat with Supervision. Supine to/from sit with Mod Assist for LE management. Attempt to have pt perform prone exercises but pt does not tolerate this position this date. Sidelying B LE hip extension and clams x 10 reps. Supine bridges x 10 reps, hip abd/add x 5 reps with AAROM due to hip adductor weakness. Pt left seated in recliner in room with needs in reach and end of therapy session.  Therapy Documentation Precautions:  Precautions Precautions: Fall Precaution Comments: paraparesis Restrictions Weight Bearing Restrictions: No  See Function Navigator for Current Functional Status.   Therapy/Group: Individual Therapy  Peter Congoaylor Olyver Hawes, PT, DPT 03/29/2017, 3:55 PM

## 2017-03-29 NOTE — Progress Notes (Signed)
Physical Therapy Session Note  Patient Details  Name: Victor ChristmasMarcus E Wignall MRN: [id number] Date of Birth: [birth year]  Today's Date: 03/29/2017 PT Individual Time: 0900-1000 PT Individual Time Calculation (min): 60 min   Short Term Goals: Week 2:  PT Short Term Goal 1 (Week 2): Pt will perform bed mobility with consistent S PT Short Term Goal 2 (Week 2): Pt will perform transfers w/c <>bed with consistent S PT Short Term Goal 3 (Week 2): Pt will propel w/c x150' with S  PT Short Term Goal 4 (Week 2): Pt will perform gait x10' with maxA  Skilled Therapeutic Interventions/Progress Updates: Pt received seated in w/c, denies pain and agreeable to treatment. B shoes, LLE AFO donned totalA. W/c propulsion x100' with BUE and S. Gait x10' including two turns, minA for hamstring activation to reduce compensatory hiking/vaulting with RLE. Second gait trial x10' with minA, not including turns and fatigues more quickly on second trial. Pt reports urgency to have bowel movement. Returned to room totalA: transferred to drop arm BSC over toilet with minA and pt performed seated push up while therapist removed pants/brief. Required several minutes sitting on toilet unsuccessfully attempting to have bowel movement; pt ultimately requested assist from RN for digital stimulation to assist with void. Sit<>[facility]; attempted to perform hygiene with minA for balance, ultimately therapist performed hygiene and clothing management totalA in standing. Stand pivot transfer to w/c with minA and RW. W/c propulsion with BUE and S x150' on tile and carpeted surface. Remained seated in w/c at end of session, all needs in reach.      Therapy Documentation Precautions:  Precautions Precautions: Fall Precaution Comments: paraparesis Restrictions Weight Bearing Restrictions: No   See Function Navigator for Current Functional Status.   Therapy/Group: Individual Therapy  Vista Lawmanlizabeth J Tygielski 03/29/2017,  3:42 PM

## 2017-03-29 NOTE — Progress Notes (Signed)
Occupational Therapy Session Note  Patient Details  Name: Victor [name] MRN: [id number] Date of Birth: 1955/06/17  Today's Date: 03/29/2017 OT Individual Time: 0700-0800 OT Individual Time Calculation (min): 60 min    Short Term Goals: Week 2:  OT Short Term Goal 1 (Week 2): Pt will perform clothing management after toileting with min A for balance. OT Short Term Goal 2 (Week 2): Pt will perform LB dressing tasks with mod A with sit<>stand from w/c/EOB OT Short Term Goal 3 (Week 2): Pt will perform toilet transfers with min A and LRAD  Skilled Therapeutic Interventions/Progress Updates:    Pt resting in bed upon arrival.  Pt performed bed mobility with min A for BLE management and steady A for sitting balance while bed unlevel.  Pt performed scoot transfer to recliner with steady A for.  Pt completed eating breakfast and performed slide board transfer to w/c (incline) with min A.  Pt assisted with donning TED hose and completed grooming tasks at sink.  Pt educated on placement of leg rests and practiced placing/removing X 3.  Pt remained in w/c with all needs within reach.  Therapy Documentation Precautions:  Precautions Precautions: Fall Precaution Comments: paraparesis Restrictions Weight Bearing Restrictions: No General:   Pain: Pain Assessment Pain Score: 5  Pain Type: Acute pain Pain Location: Back Pain Descriptors / Indicators: Aching Pain Intervention(s):RN aware and repositioned See Function Navigator for Current Functional Status.   Therapy/Group: Individual Therapy  Rich BraveLanier, Tenishia Ekman Chappell 03/29/2017, 11:58 AM

## 2017-03-29 NOTE — Progress Notes (Signed)
[facility] PHYSICAL MEDICINE & REHABILITATION     PROGRESS NOTE    Subjective/Complaints: Up in chair emptying his bladder.  Sleep was reasonable once again.  Denies pain today   ROS: pt denies nausea, vomiting, diarrhea, cough, shortness of breath or chest pain   Objective: Vital Signs: Blood pressure (!) 128/56, pulse 98, temperature 98.2 F (36.8 C), temperature source Oral, resp. rate 18, height 5\' 7"  (1.702 m), weight 69.1 kg (152 lb 5.4 oz), SpO2 98 %. No results found. No results for input(s): WBC, HGB, HCT, PLT in the last 72 hours. No results for input(s): NA, K, CL, GLUCOSE, BUN, CREATININE, CALCIUM in the last 72 hours.  Invalid input(s): CO CBG (last 3)  No results for input(s): GLUCAP in the last 72 hours.  Wt Readings from Last 3 Encounters:  03/28/17 69.1 kg (152 lb 5.4 oz)  03/14/17 76.5 kg (168 lb 10.4 oz)  02/22/17 69.9 kg (154 lb)    Physical Exam:  Constitutional: He appearswell-developedand well-nourished.No distress.  HENT:  Head:Normocephalic.  Eyes:EOMare normal.  Neck:Normal range of motion.Neck supple.No thyromegalypresent.  Cardiovascular: RRR without murmur. No JVD        Respiratory:CTA Bilaterally without wheezes or rales. Normal effort    ON:GEXBGI:Soft.Bowel sounds are normal. He exhibitsno distension.  Genitourinary:  Skin.   Intact  neurological. Cognitively appropriate. No gross nystagmus seen with confrontation. UE 5/5 bilaterally.Bilateral hip flexion is 1+ to 2- out of 5 knee extension  1+ out of 5 hip abduction and abduction are 1+ out of 5. Bilateral ankle dorsiflexion and plantar flexion are 2 to 2+out of 5--motor exam stable.decr sens below mid chest.   psychiatric: Fairly engaging today     Assessment/Plan: 1. 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 Physiatrist is providing close team  supervision and 24 hour management of active medical problems listed below. Physiatrist and rehab team continue to assess barriers to discharge/monitor patient progress toward functional and medical goals.  Function:  Bathing Bathing position Bathing activity did not occur: N/A Position: Shower(sitting on Stedy)  Bathing parts Body parts bathed by patient: Right arm, Left arm, Chest, Abdomen, Front perineal area, Right upper leg, Left upper leg Body parts bathed by helper: Buttocks, Left lower leg, Right lower leg  Bathing assist Assist Level: Touching or steadying assistance(Pt > 75%)      Upper Body Dressing/Undressing Upper body dressing Upper body dressing/undressing activity did not occur: N/A What is the patient wearing?: Pull over shirt/dress     Pull over shirt/dress - Perfomed by patient: Thread/unthread right sleeve, Thread/unthread left sleeve, Put head through opening, Pull shirt over trunk   Button up shirt - Perfomed by patient: Thread/unthread right sleeve, Thread/unthread left sleeve, Pull shirt around back, Button/unbutton shirt      Upper body assist Assist Level: Supervision or verbal cues      Lower Body Dressing/Undressing Lower body dressing Lower body dressing/undressing activity did not occur: N/A What is the patient wearing?: Pants, Ted Hose, Non-skid slipper socks     Pants- Performed by patient: Thread/unthread right pants leg Pants- Performed by helper: Thread/unthread left pants leg, Pull pants up/down, Thread/unthread right pants leg Non-skid slipper socks- Performed by patient: Don/doff right sock, Don/doff left sock Non-skid slipper socks- Performed by helper: Don/doff right sock, Don/doff left sock               TED Hose - Performed by  helper: Don/doff right TED hose, Don/doff left TED hose  Lower body assist Assist for lower body dressing: (MAX A)      Toileting Toileting Toileting activity did not occur: N/A   Toileting steps  completed by helper: Adjust clothing prior to toileting, Adjust clothing after toileting, Performs perineal hygiene    Toileting assist     Transfers Chair/bed transfer   Chair/bed transfer method: Stand pivot Chair/bed transfer assist level: Touching or steadying assistance (Pt > 75%) Chair/bed transfer assistive device: Walker, Orthosis     Locomotion Ambulation     Max distance: 10 ft Assist level: Touching or steadying assistance (Pt > 75%)   Wheelchair   Type: Manual Max wheelchair distance: 75 ft Assist Level: Supervision or verbal cues  Cognition Comprehension Comprehension assist level: Understands complex 90% of the time/cues 10% of the time  Expression Expression assist level: Expresses complex ideas: With extra time/assistive device  Social Interaction Social Interaction assist level: Interacts appropriately with others with medication or extra time (anti-anxiety, antidepressant).  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 Status post thrombectomy of left brachial artery, unsuccessful despite multiple attempts by vascular surgery 03/08/2017 as well as recent aortic arch replacement followed byTEVARprocedure 01/21/2017 -Continue physical and occupational therapies. Making functional gains   -Team conference today 2. DVT Prophylaxis/Anticoagulation: SCDs.  Dopplers performed yesterday and negative 3. Pain Management:Hydrocodone as needed  -continue MS Contin 15 mg every 12 hours with good results thus far  4. Mood:Provide emotional support. Neuro-psych notes reviewed   -continued scheduled trazodone to help with sleep 5. Neuropsych: This patientiscapable of making decisions on hisown behalf. 6. Skin/Wound Care:Routine skin checks 7.  Fluids/Electrolytes/Nutrition:I personally reviewed the patient's labs today.   Marland Kitchen.  8.Hypertension.   Lopressor 12.5 mg twice a day.     -contine lopressor, holding Cozaar 25 mg   -Blood pressure reasonable 12/4 9.Neurogenic bowel and bladder. Appears to be continent. Continue softeners/laxative   -oob to void   -am suppository prn 10. Fever: resolved   LOS (Days) 14 A FACE TO FACE EVALUATION WAS PERFORMED  Ranelle OysterSWARTZ,ZACHARY T, MD 03/29/2017 9:08 AM

## 2017-03-29 NOTE — Progress Notes (Signed)
Occupational Therapy Note  Patient Details  Name: Victor [name] MRN: [id number] Date of Birth: 08-Mar-1956  Today's Date: 03/29/2017 OT Individual Time: 1100-1155 OT Individual Time Calculation (min): 55 min   Pt denies pain Individual Therapy  Pt declined bathing and/or changing clothing.  Pt propelled w/c to ADL apartment and practiced stand pivot transfers with RW and bed mobility.  Pt performed transfers with steady A after LLE AFO donned.  Pt required assistance with BLE management during sit>supine but was able to complete supine>sit EOB with supervision.  Pt performed transfers and sit<>supine X 3 before propelling back to room.  Pt remained in w/c with all needs within reach.    Victor [name], Victor [name] 03/29/2017, 11:59 AM

## 2017-03-30 ENCOUNTER — Inpatient Hospital Stay (HOSPITAL_COMMUNITY): Payer: PRIVATE HEALTH INSURANCE

## 2017-03-30 ENCOUNTER — Inpatient Hospital Stay (HOSPITAL_COMMUNITY): Payer: PRIVATE HEALTH INSURANCE | Admitting: Physical Therapy

## 2017-03-30 NOTE — Progress Notes (Signed)
Physical Therapy Session Note  Patient Details  Name: Victor [name] MRN: [id number] Date of Birth: 30-Nov-1955  Today's Date: 03/30/2017 PT Individual Time: 1320-1415 PT Individual Time Calculation (min): 55 min (25 min make up)  Short Term Goals: Week 2:  PT Short Term Goal 1 (Week 2): Pt will perform bed mobility with consistent S PT Short Term Goal 2 (Week 2): Pt will perform transfers w/c <>bed with consistent S PT Short Term Goal 3 (Week 2): Pt will propel w/c x150' with S  PT Short Term Goal 4 (Week 2): Pt will perform gait x10' with maxA  Skilled Therapeutic Interventions/Progress Updates: Pt presented in bed stating fatigue but agreeable to therapy. Performed supine to sit with modA for LE management and use of features. Performed lateral scoot transfer to w/c min guard. PTA donned shoes and LAFO total assist for time management. Pt propelled to rehab gym supervision. Performed stand pivot to mat min guard. Pt performed sit to stand from elevated mat minA with cues for increasing anterior wt shift x 4 and cues for attempt eccentric control on descent. Gait training 15f x 1 minA with modA during turning to chair. Additional gait x 128fminA to return to w/c. Pt propelled back to room supervision and returned to bed in same manner as prior and use of bed rails to [facility] in bed with alarm on and needs met.      Therapy Documentation Precautions:  Precautions Precautions: Fall Precaution Comments: paraparesis Restrictions Weight Bearing Restrictions: No General:   Vital Signs: Therapy Vitals Temp: 97.9 F (36.6 C) Temp Source: Oral Oxygen Therapy SpO2: 99 % O2 Device: Not Delivered Pain: Pain Assessment Pain Score: Asleep   See Function Navigator for Current Functional Status.   Therapy/Group: Individual Therapy  Brighten Orndoff  Karron Goens, PTA  03/30/2017, 3:47 PM

## 2017-03-30 NOTE — Patient Care Conference (Signed)
Inpatient RehabilitationTeam Conference and Plan of Care Update Date: 03/29/2017   Time: 2:05 PM    Patient Name: Victor [name]      Medical Record Number: [id number]  Date of Birth: 1955-09-05 Sex: Male         Room/Bed: 4W17C/4W17C-01 Payor Info: Payor: MEDICAID PENDING / Plan: MEDICAID PENDING / Product Type: *No Product type* /    Admitting Diagnosis:  Thoracic spinal cord infaracts with paraplegis  Admit Date/Time:  03/15/2017  3:40 PM Admission Comments: No comment available   Primary Diagnosis:  <principal problem not specified> Principal Problem: <principal problem not specified>  Patient Active Problem List   Diagnosis Date Noted  . Spinal cord infarction (HCC) 03/15/2017  . Paraplegia (HCC) 03/07/2017  . Bilateral leg weakness 03/07/2017  . S/P thoracic aortic aneurysm repair 01/21/2017  . Coronary artery calcification 12/23/2016  . S/P aortic dissection repair 12/23/2016  . Thoracic aortic aneurysm without rupture (HCC) 12/23/2016  . Preoperative cardiovascular examination 12/23/2016  . Essential hypertension 09/23/2015  . Iron deficiency anemia 01/28/2015  . Weight loss 10/30/2014  . Hospital-acquired pneumonia 10/09/2014  . Ischemic leg 10/09/2014  . Aortic dissection (HCC) 10/07/2014    Expected Discharge Date: Expected Discharge Date: 04/06/17  Team Members Present: Physician leading conference: Dr. Faith RogueZachary Swartz Social Worker Present: Amada JupiterLucy Uziel Covault, LCSW Nurse Present: Kennon PortelaJeanna Hicks, RN PT Present: Alyson ReedyElizabeth Tygielski, PT OT Present: Ardis Rowanom Lanier, COTA SLP Present: Colin BentonMadison Cratch, SLP PPS Coordinator present : Tora DuckMarie Noel, RN, CRRN     Current Status/Progress Goal Weekly Team Focus  Medical   Patient making functional gains with lower extremities.  Remains continent of bowel and bladder.  Pain seems to be under control  Minimalized pain during activity  Management of cardiovascular parameters, maintain sleep, establishment of a program of source for bowel  and bladder emptying   Bowel/Bladder   Continent episodes uses the urinal w/o difficulty verbalized , LBM 03/28/17 Remains on schedule stool softener and prn laxatives  maintain continence and skin integrity  Assess QS and prn, provided medications as ordered amd notify MD/on call if no redults   Swallow/Nutrition/ Hydration             ADL's   bathing-mod A; LB dressing-max A; functional transfers-min A;   min A overall  BADL retraining, functional tranfsers, activity tolerance, safety awareness, family education   Mobility   transfers: Min Assist, gait: Min Assist for short distances with +2 for safety, w/c mobility: Supervision  S at w/c level, minA gait 20' (upgraded)  strengthening, coordination, gait training, transfer training   Communication             Safety/Cognition/ Behavioral Observations            Pain   Pain persist throughout shift, MSIR scheduled Q12 hrs and prn Norco q 4hrs states never reaches goal, score 7-8/10 prior to meds and 5-6 with f/u, alternatives measures with repositioning slight relief  have decreased pain scores to indicate adequate pain control, medication is not relieving pain  Cont. to Assess, schedule and prn medication with alternative measures for comfort and relieve, ( nack, legs, etc   Skin              Rehab Goals Patient on target to meet rehab goals: Yes *See Care Plan and progress notes for long and short-term goals.     Barriers to Discharge  Current Status/Progress Possible Resolutions Date Resolved   Physician    Neurogenic Bowel & Bladder;Medical stability  Continued education for the patient, orthotic and adaptive equipment training      Nursing                  PT                    OT                  SLP                SW                Discharge Planning/Teaching Needs:  Pt to return home with his parents who both work p/t but family can provide 24/7 assistance.  Need to set up teaching.   Team Discussion:   Making gradual gains;  Bowel program working well;  Cont of bladder.  OT reports concern of what appears enlarging hernia - MD aware/ may need support equipment.  Making good functional gains.  amb ~ 10 ' min/ mod assist but easily fatigues.  Plan to upgrade amb goal to min assist.  Ready to begin family ed.  Revisions to Treatment Plan:  None    Continued Need for Acute Rehabilitation Level of Care: The patient requires daily medical management by a physician with specialized training in physical medicine and rehabilitation for the following conditions: Daily direction of a multidisciplinary physical rehabilitation program to ensure safe treatment while eliciting the highest outcome that is of practical value to the patient.: Yes Daily medical management of patient stability for increased activity during participation in an intensive rehabilitation regime.: Yes Daily analysis of laboratory values and/or radiology reports with any subsequent need for medication adjustment of medical intervention for : Post surgical problems;Neurological problems  Victor [name] 03/30/2017, 3:43 PM

## 2017-03-30 NOTE — Progress Notes (Signed)
Occupational Therapy Note  Patient Details  Name: Victor [name] MRN: [id number] Date of Birth: 1955/06/07  Today's Date: 03/30/2017 OT Individual Time: 1100-1115 OT Individual Time Calculation (min): 15 min  and Today's Date: 03/30/2017 OT Missed Time: 45 Minutes Missed Time Reason: Patient fatigue;Pain;Other (comment)(dizzy)  Pt c/o increased pain in neck (7/10); RN aware and repositioned Individual Therapy  Pt resting in w/c upon arrival, declining therapy although he stated he did want to get back into bed.  Pt stated he didn't "fell too good" and was a "little dizzy." Focus on w/c mobility and w/c management in preparation for scoot transfer to bed (steady A). Pt able to reposition himself in bed with use of bed rails.  Pt remained in bed with all needs within reach. Pt missed 45 mins skilled OT services.   [name], Maite Burlison Park[facility] HospitalChappell 03/30/2017, 11:32 AM

## 2017-03-30 NOTE — Progress Notes (Signed)
Occupational Therapy Session Note  Patient Details  Name: Victor [name] MRN: [id number] Date of Birth: Sep 17, 1955  Today's Date: 03/30/2017 OT Individual Time: 0700-0800 OT Individual Time Calculation (min): 60 min    Short Term Goals: Week 2:  OT Short Term Goal 1 (Week 2): Pt will perform clothing management after toileting with min A for balance. OT Short Term Goal 2 (Week 2): Pt will perform LB dressing tasks with mod A with sit<>stand from w/c/EOB OT Short Term Goal 3 (Week 2): Pt will perform toilet transfers with min A and LRAD  Skilled Therapeutic Interventions/Progress Updates:    Pt resting in bed upon arrival.  Pt required min A for supine>sit EOB in preparation for scoot transfer to w/c.  Pt engaged in bathing/dressing tasks with sit<>stand from w/c at sink.  Pt attempted sit<>stand without use of Stedy or donning LLE AFO.  Pt became anxious/fearful and requested to use Stedy to bathe buttocks and pull up pants.  Pt requires more than a reasonable amount of time to complete all tasks.  Pt requires max A for LB clothing management.  Pt remained in w/c with all needs within reach.   Therapy Documentation Precautions:  Precautions Precautions: Fall Precaution Comments: paraparesis Restrictions Weight Bearing Restrictions: No Pain:  Pt denies pain  See Function Navigator for Current Functional Status.   Therapy/Group: Individual Therapy  Rich BraveLanier, Maie Kesinger Chappell 03/30/2017, 9:32 AM

## 2017-03-30 NOTE — Progress Notes (Signed)
Physical Therapy Session Note  Patient Details  Name: Victor [name] MRN:[name] [id number] Date of Birth: 08/09/1955  Today's Date: 03/30/2017 PT Individual Time: 0900-1000 PT Individual Time Calculation (min): 60 min   Short Term Goals: Week 2:  PT Short Term Goal 1 (Week 2): Pt will perform bed mobility with consistent S PT Short Term Goal 2 (Week 2): Pt will perform transfers w/c <>bed with consistent S PT Short Term Goal 3 (Week 2): Pt will propel w/c x150' with S  PT Short Term Goal 4 (Week 2): Pt will perform gait x10' with maxA  Skilled Therapeutic Interventions/Progress Updates: Pt received seated in w/c, denies pain and agreeable to treatment. Shoes and LLE AFO donned totalA. Pt performed oral hygiene and use of urinal with setupA. W/c propulsion x150' with BUE and S. Gait x3 trials with RW and minA; first trial x10' including two turns, second and third trial 5-6' per trial with reporting increased LE fatigue. Attempted one step up to 3" step with B handrails; as therapist assisting with R hip/knee flexion LLE began to buckle; required totalA to return to sitting in w/c. Stand pivot transfer w/c <>mat table with RW and minA. Standing LE taps to 1" step. RLE requires minA to lift RLE, modA to stabilize LLE in stance. When tapping LLE, required maxA for LLE placement, no assist for RLE stance. Returned to room totalA d/t fatigue. Remained seated in w/c at end of session, all needs in reach.      Therapy Documentation Precautions:  Precautions Precautions: Fall Precaution Comments: paraparesis Restrictions Weight Bearing Restrictions: No  See Function Navigator for Current Functional Status.   Therapy/Group: Individual Therapy  Vista Lawmanlizabeth J Tygielski 03/30/2017, 10:36 AM

## 2017-03-30 NOTE — Progress Notes (Signed)
[facility] PHYSICAL MEDICINE & REHABILITATION     PROGRESS NOTE    Subjective/Complaints: No complaints today.  He is up with occupational therapy getting cleaned up.  Pain seems controlled  ROS: pt denies nausea, vomiting, diarrhea, cough, shortness of breath or chest pain   Objective: Vital Signs: Blood pressure 107/60, pulse 88, temperature 99 F (37.2 C), temperature source Oral, resp. rate 16, height 5\' 7"  (1.702 m), weight 69.2 kg (152 lb 8.9 oz), SpO2 100 %. No results found. No results for input(s): WBC, HGB, HCT, PLT in the last 72 hours. No results for input(s): NA, K, CL, GLUCOSE, BUN, CREATININE, CALCIUM in the last 72 hours.  Invalid input(s): CO CBG (last 3)  No results for input(s): GLUCAP in the last 72 hours.  Wt Readings from Last 3 Encounters:  03/30/17 69.2 kg (152 lb 8.9 oz)  03/14/17 76.5 kg (168 lb 10.4 oz)  02/22/17 69.9 kg (154 lb)    Physical Exam:  Constitutional: He appearswell-developedand well-nourished.No distress.  HENT:  Head:Normocephalic.  Eyes:EOMare normal.  Neck:Normal range of motion.Neck supple.No thyromegalypresent.  Cardiovascular: RRR without murmur. No JVD         Respiratory:CTA Bilaterally without wheezes or rales. Normal effort     ZO:XWRUGI:Soft.Bowel sounds are normal. He exhibitsno distension.  Genitourinary:  Skin.   Intact  neurological. Cognitively appropriate. No gross nystagmus seen with confrontation. UE 5/5 bilaterally.Bilateral hip flexion is  2- out of 5,  knee extension  1+ to 2- out of 5 hip abduction and abduction are 1+ out of 5. Bilateral ankle dorsiflexion and plantar flexion are 2 to 2+out of 5. decr sens below mid chest.   psychiatric:  Psych: cooperative     Assessment/Plan: 1. 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 Physiatrist is providing close team supervision and 24  hour management of active medical problems listed below. Physiatrist and rehab team continue to assess barriers to discharge/monitor patient progress toward functional and medical goals.  Function:  Bathing Bathing position Bathing activity did not occur: N/A Position: Shower(sitting on Stedy)  Bathing parts Body parts bathed by patient: Right arm, Left arm, Chest, Abdomen, Front perineal area, Right upper leg, Left upper leg Body parts bathed by helper: Buttocks, Left lower leg, Right lower leg  Bathing assist Assist Level: Touching or steadying assistance(Pt > 75%)      Upper Body Dressing/Undressing Upper body dressing Upper body dressing/undressing activity did not occur: N/A What is the patient wearing?: Pull over shirt/dress     Pull over shirt/dress - Perfomed by patient: Thread/unthread right sleeve, Thread/unthread left sleeve, Put head through opening, Pull shirt over trunk   Button up shirt - Perfomed by patient: Thread/unthread right sleeve, Thread/unthread left sleeve, Pull shirt around back, Button/unbutton shirt      Upper body assist Assist Level: Supervision or verbal cues      Lower Body Dressing/Undressing Lower body dressing Lower body dressing/undressing activity did not occur: N/A What is the patient wearing?: Pants, Ted Hose, Non-skid slipper socks     Pants- Performed by patient: Thread/unthread right pants leg Pants- Performed by helper: Thread/unthread left pants leg, Pull pants up/down, Thread/unthread right pants leg Non-skid slipper socks- Performed by patient: Don/doff right sock, Don/doff left sock Non-skid slipper socks- Performed by helper: Don/doff right sock, Don/doff left sock               TED Hose -  Performed by helper: Don/doff right TED hose, Don/doff left TED hose  Lower body assist Assist for lower body dressing: (MAX A)      Toileting Toileting Toileting activity did not occur: N/A   Toileting steps completed by helper:  Adjust clothing prior to toileting, Adjust clothing after toileting, Performs perineal hygiene    Toileting assist     Transfers Chair/bed transfer   Chair/bed transfer method: Lateral scoot Chair/bed transfer assist level: Touching or steadying assistance (Pt > 75%) Chair/bed transfer assistive device: Walker, Orthosis, Armrests     Locomotion Ambulation     Max distance: 10 ft Assist level: Touching or steadying assistance (Pt > 75%)   Wheelchair   Type: Manual Max wheelchair distance: 150 Assist Level: Supervision or verbal cues  Cognition Comprehension Comprehension assist level: Follows complex conversation/direction with no assist  Expression Expression assist level: Expresses complex ideas: With no assist  Social Interaction Social Interaction assist level: Interacts appropriately with others - No medications needed.  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 Status post thrombectomy of left brachial artery, unsuccessful despite multiple attempts by vascular surgery 03/08/2017 as well as recent aortic arch replacement followed byTEVARprocedure 01/21/2017 -Continue physical and occupational therapies. Making functional gains   -Working towards goals.  May need AFO for left lower extremity.  We can determine that over the next several days 2. DVT Prophylaxis/Anticoagulation: SCDs.  Dopplers performed yesterday and negative 3. Pain Management:Hydrocodone as needed  -continue MS Contin 15 mg every 12 hours with good results thus far  4. Mood:Provide emotional support. Neuro-psych notes reviewed   -continued scheduled trazodone to help with sleep 5. Neuropsych: This patientiscapable of making decisions on hisown behalf. 6. Skin/Wound Care:Routine skin  checks 7. Fluids/Electrolytes/Nutrition:I personally reviewed the patient's labs today.   Marland Kitchen.  8.Hypertension.   Lopressor 12.5 mg twice a day.     -contine lopressor, holding Cozaar 25 mg   -Blood pressure reasonable 12/4 9.Neurogenic bowel and bladder. Appears to be continent. Continue softeners/laxative   -oob to void   -am suppository prn-he seems to have brought into the concept of using a suppository to help him have 10. Fever: resolved   LOS (Days) 15 A FACE TO FACE EVALUATION WAS PERFORMED  Ranelle OysterSWARTZ,ZACHARY T, MD 03/30/2017 9:05 AM

## 2017-03-30 NOTE — Progress Notes (Signed)
Social Work Patient ID: Victor [name], male   DOB: 12/16/1955, 61 y.o.   MRN: [id number]   Met with pt and his mother yesterday afternoon to review team conference.  Both aware that team continues to plan for d/c 12/12 and discussed need to begin hands on family training.  Have scheduled for mother to be here on 12/7 @ 9:00 for family ed.  Will follow up with them again at conclusion of therapies that day.  , , LCSW  

## 2017-03-31 ENCOUNTER — Inpatient Hospital Stay (HOSPITAL_COMMUNITY): Payer: PRIVATE HEALTH INSURANCE | Admitting: Physical Therapy

## 2017-03-31 ENCOUNTER — Other Ambulatory Visit: Payer: Self-pay

## 2017-03-31 ENCOUNTER — Inpatient Hospital Stay: Admission: RE | Admit: 2017-03-31 | Payer: Self-pay | Source: Ambulatory Visit

## 2017-03-31 ENCOUNTER — Inpatient Hospital Stay (HOSPITAL_COMMUNITY): Payer: PRIVATE HEALTH INSURANCE

## 2017-03-31 ENCOUNTER — Encounter: Payer: Self-pay | Admitting: Cardiothoracic Surgery

## 2017-03-31 MED ORDER — FLEET ENEMA 7-19 GM/118ML RE ENEM: 1.0000 | ENEMA | Freq: Every day | RECTAL | Status: DC | PRN

## 2017-03-31 NOTE — Progress Notes (Signed)
Physical Therapy Session Note  Patient Details  Name: Victor [name] MRN: [id number] Date of Birth: 1956-01-29  Today's Date: 03/31/2017 PT Individual Time: 1415-1530 PT Individual Time Calculation (min): 75 min   Short Term Goals: Week 3:  PT Short Term Goal 1 (Week 3): LTG due to short ELOS PT Short Term Goal 2 (Week 3): LTG due to short ELOS PT Short Term Goal 3 (Week 3): LTG due to short ELOS PT Short Term Goal 4 (Week 3): LTG due to short ELOS  Skilled Therapeutic Interventions/Progress Updates:    Pt supine in bed, agreeable to participate in therapy session. Pt reports no pain this PM. Supine to sit Mod A for LE management. Squat pivot transfer w/c to/from bed with Supervision and v/c for safe w/c setup. Donned shoes and L AFO dependently for time conservation. Pt engaged in seated ADL activity at the sink: shaving and combing hair, setup to gather supplies. Manual w/c propulsion x 75 ft, x 150 ft with Supervision. 1" step-taps in // bars with Max A to lift L and R LE onto step. Sit to/from stand x 5 in // bars with Min Assist, focus on weight shift forward with "nose over toes" technique. Pt exhibits fair carryover of education and appears fearful of leaning forwards during transfer. Seated B LE therex x 10 reps: heel raises, toe raises with sheet to perform AAROM, hip add ball squeezes. Pt left supine in bed with needs in reach, PRAFO to BLE.  Therapy Documentation Precautions:  Precautions Precautions: Fall Precaution Comments: paraparesis Restrictions Weight Bearing Restrictions: No  See Function Navigator for Current Functional Status.   Therapy/Group: Individual Therapy  Peter Congoaylor Elide Stalzer, PT, DPT  03/31/2017, 3:52 PM

## 2017-03-31 NOTE — Progress Notes (Addendum)
Physical Therapy Weekly Progress Note  Patient Details  Name: Victor [name] MRN: [id number] Date of Birth: 31-Jan-1956  Beginning of progress report period: March 24, 2017 End of progress report period: March 31, 2017  Today's Date: 03/31/2017 PT Individual Time:  8:30-9:30 PT Minutes: 60 minutes     Patient has met 2 of 4 short term goals. Pt currently requires S to Min A for bed mobility due to LE weakness and depending on fatigue level. Pt is S for squat pivot transfers, Min A with RW for stand pivot transfers. Pt is able to propel w/c with S to Mod I, fatigues quickly due to decreased endurance. Pt is Min A for ambulation short distances (10 ft) with RW and L AFO, fatigues quickly and becomes unsafe due to B LE buckling.  Patient continues to demonstrate the following deficits muscle weakness, decreased cardiorespiratory endurance, and unbalanced muscle activation and decreased coordination and therefore will continue to benefit from skilled PT intervention to increase functional independence with mobility.  Patient progressing toward long term goals..  Continue plan of care.  PT Short Term Goals Week 2:  PT Short Term Goal 1 (Week 2): Pt will perform bed mobility with consistent S PT Short Term Goal 1 - Progress (Week 2): Progressing toward goal PT Short Term Goal 2 (Week 2): Pt will perform transfers w/c <>bed with consistent S PT Short Term Goal 2 - Progress (Week 2): Progressing toward goal PT Short Term Goal 3 (Week 2): Pt will propel w/c x150' with S  PT Short Term Goal 3 - Progress (Week 2): Met PT Short Term Goal 4 (Week 2): Pt will perform gait x10' with maxA PT Short Term Goal 4 - Progress (Week 2): Met Week 3:  PT Short Term Goal 1 (Week 3): LTG due to short ELOS PT Short Term Goal 2 (Week 3): LTG due to short ELOS PT Short Term Goal 3 (Week 3): LTG due to short ELOS PT Short Term Goal 4 (Week 3): LTG due to short ELOS  Skilled Therapeutic Interventions/Progress  Updates:  Pt seated in w/c in room, agreeable to participate in therapy session. Pt reports no pain at rest, onset of LBP with standing activity, pain not rated. Pt receives pain medication from nursing during therapy session. Sit to stand with min assist to RW, focus on weight shift forward and "nose over toes" technique. Ambulation 2 x 10 ft with RW and Min assist for balance, L AFO, focus on improving endurance and ambulation distance. Pt demos improved upright posture with ambulation but continues to hip hike to advance LE and exhibits some B knee buckling due to muscle weakness and fatigue. Turning with RW to sit safely with Min Assist, focus on safe sitting technique as pt tends to descend quickly into chair when tired and needs v/c for safe descent. Squat pivot transfer w/c to/from mat with S. Pt is able to safely set up transfer and complete transfer with minimal verbal cueing for positioning. Supine B LE therex x 5-10 reps, AAROM with L LE weakness > R LE weakness. Manual w/c propulsion 2 x 150 ft with S. Pt left seated in recliner in room with needs in reach.   Therapy Documentation Precautions:  Precautions Precautions: Fall Precaution Comments: paraparesis Restrictions Weight Bearing Restrictions: No  Pain Assessment Pain Assessment: 0-10 Pain Score: 3  See Function Navigator for Current Functional Status.  Therapy/Group: Individual Therapy   Excell Seltzer, PT, DPT 03/31/2017, 11:42 AM

## 2017-03-31 NOTE — Progress Notes (Signed)
Occupational Therapy Session Note  Patient Details  Name: Victor [name] MRN: [id number] Date of Birth: Jan 25, 1956  Today's Date: 03/31/2017 OT Individual Time: 0700-0758 OT Individual Time Calculation (min): 58 min    Short Term Goals: Week 2:  OT Short Term Goal 1 (Week 2): Pt will perform clothing management after toileting with min A for balance. OT Short Term Goal 2 (Week 2): Pt will perform LB dressing tasks with mod A with sit<>stand from w/c/EOB OT Short Term Goal 3 (Week 2): Pt will perform toilet transfers with min A and LRAD  Skilled Therapeutic Interventions/Progress Updates:    Pt resting in bed upon arrival and requested to eat breakfast which was just delivered.  Pt required min A to move LLE off EOB in preparation for scoot transfer to w/c.  Pt performed transfer with supervision and repositioned w/c beside bed.  Pt required assistance placing leg rest on w/c.  Pt requested use of toilet for BM and stated it was urgent.  Stedy utilized for transfer.  Pt stated he was unable to clean himself after BM.  Pt completed grooming tasks at sink.  Pt repositioned w/c next to bed and remained in w/c with all needs within reach.  Pt is independent on directing care.    Therapy Documentation Precautions:  Precautions Precautions: Fall Precaution Comments: paraparesis Restrictions Weight Bearing Restrictions: No Pain:  Pt denies pain  See Function Navigator for Current Functional Status.   Therapy/Group: Individual Therapy  Rich BraveLanier, Herschell Virani Chappell 03/31/2017, 8:00 AM

## 2017-03-31 NOTE — Progress Notes (Signed)
[place] [place] PHYSICAL MEDICINE & REHABILITATION     PROGRESS NOTE    Subjective/Complaints: Patient up in bed.  Denies any new problems.  Pain is controlled.  Walked from his bed out the door yesterday using his walker  ROS: pt denies nausea, vomiting, diarrhea, cough, shortness of breath or chest pain   Objective: Vital Signs: Blood pressure 112/80, pulse 90, temperature 98.4 F (36.9 C), temperature source Oral, resp. rate 17, height 5\' 7"  (1.702 m), weight 69.3 kg (152 lb 12.5 oz), SpO2 98 %. No results found. No results for input(s): WBC, HGB, HCT, PLT in the last 72 hours. No results for input(s): NA, K, CL, GLUCOSE, BUN, CREATININE, CALCIUM in the last 72 hours.  Invalid input(s): CO CBG (last 3)  No results for input(s): GLUCAP in the last 72 hours.  Wt Readings from Last 3 Encounters:  03/31/17 69.3 kg (152 lb 12.5 oz)  03/14/17 76.5 kg (168 lb 10.4 oz)  02/22/17 69.9 kg (154 lb)    Physical Exam:  Constitutional: He appearswell-developedand well-nourished.No distress.  HENT:  Head:Normocephalic.  Eyes:EOMare normal.  Neck:Normal range of motion.Neck supple.No thyromegalypresent.  Cardiovascular: RRR without murmur. No JVD           Respiratory:CTA Bilaterally without wheezes or rales. Normal effort      ZO:XWRUGI:Soft.Bowel sounds are normal. He exhibitsno distension.  Genitourinary:  Skin.   Intact  neurological. Cognitively appropriate. No gross nystagmus seen with confrontation. UE 5/5 bilaterally.Bilateral hip flexion is  2- out of 5,  knee extension  1+ to 2- out of 5 hip abduction and abduction are 1+ out of 5. Bilateral ankle dorsiflexion and plantar flexion are 2 to 2+out of 5.  Motor exam stable Decr sens below mid chest.     Psych: cooperative/flat     Assessment/Plan: 1. 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 Physiatrist  is providing close team supervision and 24 hour management of active medical problems listed below. Physiatrist and rehab team continue to assess barriers to discharge/monitor patient progress toward functional and medical goals.  Function:  Bathing Bathing position Bathing activity did not occur: N/A Position: Shower(sitting on Stedy)  Bathing parts Body parts bathed by patient: Right arm, Left arm, Chest, Abdomen, Front perineal area, Right upper leg, Left upper leg Body parts bathed by helper: Buttocks, Left lower leg, Right lower leg  Bathing assist Assist Level: Touching or steadying assistance(Pt > 75%)      Upper Body Dressing/Undressing Upper body dressing Upper body dressing/undressing activity did not occur: N/A What is the patient wearing?: Pull over shirt/dress     Pull over shirt/dress - Perfomed by patient: Thread/unthread right sleeve, Thread/unthread left sleeve, Put head through opening, Pull shirt over trunk   Button up shirt - Perfomed by patient: Thread/unthread right sleeve, Thread/unthread left sleeve, Pull shirt around back, Button/unbutton shirt      Upper body assist Assist Level: Supervision or verbal cues      Lower Body Dressing/Undressing Lower body dressing Lower body dressing/undressing activity did not occur: N/A What is the patient wearing?: Pants, Ted Hose, Non-skid slipper socks     Pants- Performed by patient: Thread/unthread right pants leg Pants- Performed by helper: Thread/unthread left pants leg, Pull pants up/down, Thread/unthread right pants leg Non-skid slipper socks- Performed by patient: Don/doff right sock, Don/doff left sock Non-skid slipper socks- Performed by helper: Don/doff right sock, Don/doff left sock  TED Hose - Performed by helper: Don/doff right TED hose, Don/doff left TED hose  Lower body assist Assist for lower body dressing: (MAX A)      Toileting Toileting Toileting activity did not occur: N/A    Toileting steps completed by helper: Adjust clothing prior to toileting, Adjust clothing after toileting, Performs perineal hygiene    Toileting assist     Transfers Chair/bed transfer   Chair/bed transfer method: Stand pivot Chair/bed transfer assist level: Touching or steadying assistance (Pt > 75%) Chair/bed transfer assistive device: Armrests, Walker, Orthosis     Locomotion Ambulation     Max distance: 9514ft Assist level: Touching or steadying assistance (Pt > 75%)   Wheelchair   Type: Manual Max wheelchair distance: 150 Assist Level: Supervision or verbal cues  Cognition Comprehension Comprehension assist level: Follows complex conversation/direction with no assist  Expression Expression assist level: Expresses complex ideas: With no assist  Social Interaction Social Interaction assist level: Interacts appropriately with others - No medications needed.  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 Status post thrombectomy of left brachial artery, unsuccessful despite multiple attempts by vascular surgery 03/08/2017 as well as recent aortic arch replacement followed byTEVARprocedure 01/21/2017 -Continue physical and occupational therapies. Making functional gains   -?AFO LLE 2. DVT Prophylaxis/Anticoagulation: SCDs.  Dopplers   negative 3. Pain Management:Hydrocodone as needed  -continue MS Contin 15 mg every 12 hours   4. Mood:Provide emotional support. Neuro-psych notes reviewed   -continued scheduled trazodone to help with sleep 5. Neuropsych: This patientiscapable of making decisions on hisown behalf. 6. Skin/Wound Care:Routine skin checks 7. Fluids/Electrolytes/Nutrition:I personally reviewed the patient's labs today.   Marland Kitchen.  8.Hypertension.    Lopressor 12.5 mg twice a day.     -contine lopressor, holding Cozaar 25 mg   -Blood pressure reasonable 12/4 9.Neurogenic bowel and bladder. Appears to be continent. Continue softeners/laxative   -oob to void   -am suppository prn-needs bowel movement today     -Add fleets enema if needed  10. Fever: resolved   LOS (Days) 16 A FACE TO FACE EVALUATION WAS PERFORMED  Victor OysterSWARTZ,Ivanna Kocak T, MD 03/31/2017 7:22 AM

## 2017-04-01 ENCOUNTER — Inpatient Hospital Stay (HOSPITAL_COMMUNITY): Payer: PRIVATE HEALTH INSURANCE

## 2017-04-01 ENCOUNTER — Inpatient Hospital Stay (HOSPITAL_COMMUNITY): Payer: PRIVATE HEALTH INSURANCE | Admitting: Physical Therapy

## 2017-04-01 LAB — CBC
HEMATOCRIT: 32.9 % — AB (ref 39.0–52.0)
HEMOGLOBIN: 10.1 g/dL — AB (ref 13.0–17.0)
MCH: 25.6 pg — ABNORMAL LOW (ref 26.0–34.0)
MCHC: 30.7 g/dL (ref 30.0–36.0)
MCV: 83.3 fL (ref 78.0–100.0)
Platelets: 346 10*3/uL (ref 150–400)
RBC: 3.95 MIL/uL — AB (ref 4.22–5.81)
RDW: 15.8 % — ABNORMAL HIGH (ref 11.5–15.5)
WBC: 5.5 10*3/uL (ref 4.0–10.5)

## 2017-04-01 LAB — BASIC METABOLIC PANEL
ANION GAP: 7 (ref 5–15)
BUN: 13 mg/dL (ref 6–20)
CHLORIDE: 102 mmol/L (ref 101–111)
CO2: 28 mmol/L (ref 22–32)
Calcium: 9.2 mg/dL (ref 8.9–10.3)
Creatinine, Ser: 1.11 mg/dL (ref 0.61–1.24)
GFR calc non Af Amer: >60 mL/min (ref >60)
Glucose, Bld: 97 mg/dL (ref 65–99)
POTASSIUM: 4.7 mmol/L (ref 3.5–5.1)
Sodium: 137 mmol/L (ref 135–145)

## 2017-04-01 NOTE — Progress Notes (Signed)
Physical Therapy Session Note  Patient Details  Name: Victor [name] MRN: [id number] Date of Birth: 1956/03/23  Today's Date: 04/01/2017 PT Individual Time: 0900-1000 PT Individual Time Calculation (min): 60 min   Short Term Goals: Week 3:  PT Short Term Goal 1 (Week 3): LTG due to short ELOS   Skilled Therapeutic Interventions/Progress Updates: Pt received supine in bed, pt's mother and sister present for hands on training and education. Family provided majority of assist throughout session with cues for pt to direct care and occasional cues from therapist for technique and safety. Supine>sit with modA for LE management. Squat pivot transfer with setupA by family and S during transfer. Performed slideboard transfer w/c <>car set at 27" height to simulate pt's family car. Family provided minA for LE management. Cues for technique to maintain forward weight shift and reduce risk of hips sliding forward on board. Discussed plan to practice in/out of family car at the beginning of next week. Family also to plan time for other sister to come in for family education over the weekend. Performed stand pivot transfer w/c <>mat table with RW; first trial with therapist providing min guard, on second trial pt's mother provided min guard. Gait x13' with RW and min guard provided by mother. Returned to room with w/c propulsion S. Remained seated in w/c at end of session, all needs in reach.      Therapy Documentation Precautions:  Precautions Precautions: Fall Precaution Comments: paraparesis Restrictions Weight Bearing Restrictions: No Pain: Pain Assessment Pain Assessment: 0-10 Pain Score: 7  Pain Type: Acute pain Pain Location: Knee Pain Orientation: Right;Left Pain Onset: Sudden Pain Intervention(s): Medication (See eMAR)   See Function Navigator for Current Functional Status.   Therapy/Group: Individual Therapy  Vista Lawmanlizabeth J Tygielski 04/01/2017, 10:55 AM

## 2017-04-01 NOTE — Progress Notes (Signed)
Occupational Therapy Note  Patient Details  Name: Victor [name] MRN: [id number] Date of Birth: 04-Apr-1956  Today's Date: 04/01/2017 OT Individual Time: 1000-1056 OT Individual Time Calculation (min): 56 min   Pt denies pain Individual Therapy  Focus on discharge planning and family education.  Mother and sister present for therapy session.  Pt's family states the only room pt can stay in is upstairs.  Will continue to problem solve with PT to make this possible.  Family states pt will be able to access bathroom with tub/shower.  Practiced tub bench transfer with stand pivot (min A into tub and mod A for standing up from tub). Practiced X 2.  Pt returned to room and transferred to recliner with steady A from pt's Mother.  Pt remained in recliner with all needs within reach and family present.    [name], [name] [facility] 04/01/2017, 10:56 AM

## 2017-04-01 NOTE — Progress Notes (Signed)
Occupational Therapy Note  Patient Details  Name: Victor [name] MRN: [id number] Date of Birth: 1955-11-26  Today's Date: 04/01/2017 OT Individual Time: 1400-1415 OT Individual Time Calculation (min): 15 min  and Today's Date: 04/01/2017 OT Missed Time: 15 Minutes Missed Time Reason: Pain;Patient fatigue  Pt c/o 10/10 HA; RN aware Individual Therapy  Pt asleep upon arrival but easily aroused.  Pt c/o HA  But agreeable to attempt participation in therapy.  Engaged in bed mobility with focus on BLE management with min A and max verbal cues.  Pt c/o increasing pain (HA) and requested to stop and rest.  Pt missed 15 mins skilled OT services.   [name], Shakeda Pearse [facility] 04/01/2017, 2:25 PM

## 2017-04-01 NOTE — Progress Notes (Addendum)
[facility] PHYSICAL MEDICINE & REHABILITATION     PROGRESS NOTE    Subjective/Complaints: No new issues.  Up with therapy working.  Slept well  ROS: pt denies nausea, vomiting, diarrhea, cough, shortness of breath or chest pain   Objective: Vital Signs: Blood pressure 111/69, pulse 60, temperature 98.3 F (36.8 C), temperature source Oral, resp. rate 18, height 5\' 7"  (1.702 m), weight 69.3 kg (152 lb 12.5 oz), SpO2 99 %. No results found. Recent Labs    04/01/17 0428  WBC 5.5  HGB 10.1*  HCT 32.9*  PLT 346   Recent Labs    04/01/17 0428  NA 137  K 4.7  CL 102  GLUCOSE 97  BUN 13  CREATININE 1.11  CALCIUM 9.2   CBG (last 3)  No results for input(s): GLUCAP in the last 72 hours.  Wt Readings from Last 3 Encounters:  03/31/17 69.3 kg (152 lb 12.5 oz)  03/14/17 76.5 kg (168 lb 10.4 oz)  02/22/17 69.9 kg (154 lb)    Physical Exam:  Constitutional: He appearswell-developedand well-nourished.No distress.  HENT:  Head:Normocephalic.  Eyes:EOMare normal.  Neck:Normal range of motion.Neck supple.No thyromegalypresent.  Cardiovascular: RRR without murmur. No JVD           Respiratory:CTA Bilaterally without wheezes or rales. Normal effort      ZO:XWRUGI:Soft.Bowel sounds are normal. He exhibitsno distension.  Genitourinary:  Skin.   Intact  neurological. Cognitively appropriate. No gross nystagmus seen with confrontation. UE 5/5 bilaterally.Bilateral hip flexion is  2- out of 5,  knee extension  1+ to 2- out of 5 hip abduction and abduction are 1+ out of 5. Bilateral ankle dorsiflexion and plantar flexion are 2 to 2+out of 5.  Motor exam unchanged Sensory exam notable for decreased light touch below the mid chest   Psych: cooperative/flat     Assessment/Plan: 1. 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 Physiatrist is providing close team  supervision and 24 hour management of active medical problems listed below. Physiatrist and rehab team continue to assess barriers to discharge/monitor patient progress toward functional and medical goals.  Function:  Bathing Bathing position Bathing activity did not occur: N/A Position: Shower  Bathing parts Body parts bathed by patient: Right arm, Left arm, Chest, Abdomen, Front perineal area, Right upper leg, Left upper leg, Buttocks Body parts bathed by helper: Buttocks, Left lower leg, Right lower leg  Bathing assist Assist Level: Touching or steadying assistance(Pt > 75%)      Upper Body Dressing/Undressing Upper body dressing Upper body dressing/undressing activity did not occur: N/A What is the patient wearing?: Pull over shirt/dress     Pull over shirt/dress - Perfomed by patient: Thread/unthread right sleeve, Thread/unthread left sleeve, Put head through opening, Pull shirt over trunk   Button up shirt - Perfomed by patient: Thread/unthread right sleeve, Thread/unthread left sleeve, Pull shirt around back, Button/unbutton shirt      Upper body assist Assist Level: Set up   Set up : To obtain clothing/put away  Lower Body Dressing/Undressing Lower body dressing Lower body dressing/undressing activity did not occur: N/A What is the patient wearing?: Pants, Underwear     Pants- Performed by patient: Pull pants up/down Pants- Performed by helper: Thread/unthread left pants leg, Thread/unthread right pants leg Non-skid slipper socks- Performed by patient: Don/doff right sock, Don/doff left sock Non-skid slipper socks- Performed by helper: Don/doff right sock, Don/doff left sock  TED Hose - Performed by helper: Don/doff right TED hose, Don/doff left TED hose  Lower body assist Assist for lower body dressing: (MAX A)      Toileting Toileting Toileting activity did not occur: N/A   Toileting steps completed by helper: Adjust clothing prior to toileting,  Adjust clothing after toileting, Performs perineal hygiene    Toileting assist Assist level: Set up/obtain supplies, Touching or steadying assistance (Pt.75%)   Transfers Chair/bed transfer   Chair/bed transfer method: Squat pivot Chair/bed transfer assist level: Supervision or verbal cues Chair/bed transfer assistive device: Armrests, Walker, Orthosis     Locomotion Ambulation     Max distance: 10 ft Assist level: Touching or steadying assistance (Pt > 75%)   Wheelchair   Type: Manual Max wheelchair distance: 150 Assist Level: Supervision or verbal cues  Cognition Comprehension Comprehension assist level: Follows complex conversation/direction with no assist  Expression Expression assist level: Expresses complex ideas: With no assist  Social Interaction Social Interaction assist level: Interacts appropriately with others - No medications needed.  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 Status post thrombectomy of left brachial artery, unsuccessful despite multiple attempts by vascular surgery 03/08/2017 as well as recent aortic arch replacement followed byTEVARprocedure 01/21/2017 -Continue physical and occupational therapies.     -?AFO LLE 2. DVT Prophylaxis/Anticoagulation: SCDs.  Dopplers   negative 3. Pain Management:Hydrocodone as needed  -continue MS Contin 15 mg every 12 hours   4. Mood:Provide emotional support. Neuro-psych notes reviewed   -continued scheduled trazodone to help with sleep 5. Neuropsych: This patientiscapable of making decisions on hisown behalf. 6. Skin/Wound Care:Routine skin checks 7. Fluids/Electrolytes/Nutrition:I personally reviewed the patient's labs today.   Marland Kitchen.  8.Hypertension.   Lopressor 12.5 mg twice a day.      -contine lopressor, holding Cozaar 25 mg   -Blood pressure reasonable 12/7 9.Neurogenic bowel and bladder. Appears to be continent. Continue softeners/laxative   -oob to void   -am suppository prn-needs bowel movement today     -Add fleets enema if needed     -give sorbitol today 10. Fever: resolved 12. Mild anemia: hgb has been stable from 10-11. Likely related to chronic disease   -no supplements at present 13. Inguinal hernia:  Large, inhibiting at times. Scrotal sling ordered   LOS (Days) 17 A FACE TO FACE EVALUATION WAS PERFORMED  Ranelle OysterSWARTZ,Emilyann Banka T, MD 04/01/2017 9:22 AM

## 2017-04-01 NOTE — Progress Notes (Signed)
Occupational Therapy Session Note  Patient Details  Name: Karna ChristmasMarcus E Salguero MRN: [id number] Date of Birth: 18-Nov-1955  Today's Date: 04/01/2017 OT Individual Time: 0700-0800 OT Individual Time Calculation (min): 60 min    Short Term Goals: Week 3:  OT Short Term Goal 1 (Week 3): STG=LTG secondary to ELOS  Skilled Therapeutic Interventions/Progress Updates:    Pt engaged in BADL retraining including bathing at shower level and dressing with si<>stand from w/c.  Pt used Stedy for shower transfer and sitting in shower secondary to unable to sit on shower chair (inguinal hernia).  Pt required assistance with threading pants but was unable to pull up pants while standing with RW. Pt requires more than a reasonable amount of time to complete tasks with multiple rest breaks.  Pt required max questioning cues to direct care.  Focus on functional transfers, sit<>stand, standing balance, directing care, and safety awareness to increase independence with BADLs.   Therapy Documentation Precautions:  Precautions Precautions: Fall Precaution Comments: paraparesis Restrictions Weight Bearing Restrictions: No   Pain:  Pt denies pain  See Function Navigator for Current Functional Status.   Therapy/Group: Individual Therapy  Rich BraveLanier, Darian Cansler [facility] 04/01/2017, 9:05 AM

## 2017-04-01 NOTE — Progress Notes (Signed)
Occupational Therapy Weekly Progress Note  Patient Details  Name: Victor [name] MRN: [id number] Date of Birth: 1956-02-22  Beginning of progress report period: March 24, 2017 End of progress report period: April 01, 2017  Patient has met 1 of 3 short term goals. Pt made steady progress with functional transfers during the past week but continues to require mod A for LB bathing/dressing/toileting tasks.  Pt performs scoot transfers to level surface at supervision level and requires min A for squat and stand pivot tranfsers. Pt requires max A for toileting tasks and LB dressing tasks. Pt's family scheduled for family education on 12/7.   Patient continues to demonstrate the following deficits: muscle weakness and muscle paralysis, decreased cardiorespiratoy endurance, decreased coordination and decreased sitting balance, decreased standing balance, decreased postural control and decreased balance strategies and therefore will continue to benefit from skilled OT intervention to enhance overall performance with BADL, iADL and Vocation.  Patient progressing toward long term goals..  Continue plan of care.  OT Short Term Goals Week 2:  OT Short Term Goal 1 (Week 2): Pt will perform clothing management after toileting with min A for balance. OT Short Term Goal 1 - Progress (Week 2): Progressing toward goal OT Short Term Goal 2 (Week 2): Pt will perform LB dressing tasks with mod A with sit<>stand from w/c/EOB OT Short Term Goal 2 - Progress (Week 2): Progressing toward goal OT Short Term Goal 3 (Week 2): Pt will perform toilet transfers with min A and LRAD OT Short Term Goal 3 - Progress (Week 2): Met Week 3:  OT Short Term Goal 1 (Week 3): STG=LTG secondary to ELOS     Therapy Documentation Precautions:  Precautions Precautions: Fall Precaution Comments: paraparesis Restrictions Weight Bearing Restrictions: No  See Function Navigator for Current Functional Status.   Leotis Shames [facility] 04/01/2017, 6:50 AM

## 2017-04-02 DIAGNOSIS — I10 Essential (primary) hypertension: Secondary | ICD-10-CM

## 2017-04-02 NOTE — Progress Notes (Signed)
[facility] PHYSICAL MEDICINE & REHABILITATION     PROGRESS NOTE    Subjective/Complaints: Up in recliner without c/o  Pt denies pain  In back or legs  ROS: pt denies nausea, vomiting, diarrhea, cough, shortness of breath or chest pain   Objective: Vital Signs: Blood pressure 117/64, pulse 90, temperature 98.2 F (36.8 C), temperature source Oral, resp. rate 18, height 5\' 7"  (1.702 m), weight 71.5 kg (157 lb 10.1 oz), SpO2 98 %. No results found. Recent Labs    04/01/17 0428  WBC 5.5  HGB 10.1*  HCT 32.9*  PLT 346   Recent Labs    04/01/17 0428  NA 137  K 4.7  CL 102  GLUCOSE 97  BUN 13  CREATININE 1.11  CALCIUM 9.2   CBG (last 3)  No results for input(s): GLUCAP in the last 72 hours.  Wt Readings from Last 3 Encounters:  04/02/17 71.5 kg (157 lb 10.1 oz)  03/14/17 76.5 kg (168 lb 10.4 oz)  02/22/17 69.9 kg (154 lb)    Physical Exam:  Constitutional: He appearswell-developedand well-nourished.No distress.  HENT:  Head:Normocephalic.  Eyes:EOMare normal.  Neck:Normal range of motion.Neck supple.No thyromegalypresent.  Cardiovascular: RRR without murmur. No JVD           Respiratory:CTA Bilaterally without wheezes or rales. Normal effort      ZO:XWRUGI:Soft.Bowel sounds are normal. He exhibitsno distension.  Genitourinary:  Skin.   Intact  neurological. Cognitively appropriate. No gross nystagmus seen with confrontation. UE 5/5 bilaterally.Bilateral hip flexion is  2- out of 5,  knee extension  1+ to 2- out of 5 hip abduction and abduction are 1+ out of 5. Bilateral ankle dorsiflexion and plantar flexion are 2 to 2+out of 5.  Motor exam unchanged    Psych: cooperative/flat     Assessment/Plan: 1. 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 Physiatrist is providing close team supervision and 24 hour management of active medical  problems listed below. Physiatrist and rehab team continue to assess barriers to discharge/monitor patient progress toward functional and medical goals.  Function:  Bathing Bathing position Bathing activity did not occur: N/A Position: Shower  Bathing parts Body parts bathed by patient: Right arm, Left arm, Chest, Abdomen, Front perineal area, Right upper leg, Left upper leg, Buttocks Body parts bathed by helper: Buttocks, Left lower leg, Right lower leg  Bathing assist Assist Level: Touching or steadying assistance(Pt > 75%)      Upper Body Dressing/Undressing Upper body dressing Upper body dressing/undressing activity did not occur: N/A What is the patient wearing?: Pull over shirt/dress     Pull over shirt/dress - Perfomed by patient: Thread/unthread right sleeve, Thread/unthread left sleeve, Put head through opening, Pull shirt over trunk   Button up shirt - Perfomed by patient: Thread/unthread right sleeve, Thread/unthread left sleeve, Pull shirt around back, Button/unbutton shirt      Upper body assist Assist Level: Set up   Set up : To obtain clothing/put away  Lower Body Dressing/Undressing Lower body dressing Lower body dressing/undressing activity did not occur: N/A What is the patient wearing?: Pants, Underwear     Pants- Performed by patient: Pull pants up/down Pants- Performed by helper: Thread/unthread left pants leg, Thread/unthread right pants leg Non-skid slipper socks- Performed by patient: Don/doff right sock, Don/doff left sock Non-skid slipper socks- Performed by helper: Don/doff right sock, Don/doff left sock  TED Hose - Performed by helper: Don/doff right TED hose, Don/doff left TED hose  Lower body assist Assist for lower body dressing: (MAX A)      Toileting Toileting Toileting activity did not occur: N/A Toileting steps completed by patient: Performs perineal hygiene, Adjust clothing after toileting, Adjust clothing prior to  toileting Toileting steps completed by helper: Adjust clothing prior to toileting, Adjust clothing after toileting, Performs perineal hygiene    Toileting assist Assist level: Set up/obtain supplies   Transfers Chair/bed transfer   Chair/bed transfer method: Squat pivot, Stand pivot Chair/bed transfer assist level: Touching or steadying assistance (Pt > 75%) Chair/bed transfer assistive device: Armrests, Walker, Orthosis     Locomotion Ambulation     Max distance: 13 Assist level: Touching or steadying assistance (Pt > 75%)   Wheelchair   Type: Manual Max wheelchair distance: 150 Assist Level: Supervision or verbal cues  Cognition Comprehension Comprehension assist level: Follows complex conversation/direction with extra time/assistive device  Expression Expression assist level: Expresses basic 90% of the time/requires cueing < 10% of the time.  Social Interaction Social Interaction assist level: Interacts appropriately 90% of the time - Needs monitoring or encouragement for participation or interaction.  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 Status post thrombectomy of left brachial artery, unsuccessful despite multiple attempts by vascular surgery 03/08/2017 as well as recent aortic arch replacement followed byTEVARprocedure 01/21/2017 -Continue physical and occupational therapies.     -no sig changes in LE strength 2. DVT Prophylaxis/Anticoagulation: SCDs.  Dopplers   negative 3. Pain Management:Hydrocodone as needed  -continue MS Contin 15 mg every 12 hours  , consider wean 4. Mood:Provide emotional support. Neuro-psych notes reviewed   -continued scheduled trazodone to help with sleep 5. Neuropsych: This  patientiscapable of making decisions on hisown behalf. 6. Skin/Wound Care:Routine skin checks 7. Fluids/Electrolytes/Nutrition:I personally reviewed the patient's labs today.   Marland Kitchen.  8.Hypertension.   Lopressor 12.5 mg twice a day.     -contine lopressor, holding Cozaar 25 mg   -Blood pressure reasonable 12/7 Vitals:   04/01/17 2037 04/02/17 0606  BP: 106/66 117/64  Pulse:  90  Resp:  18  Temp:  98.2 F (36.8 C)  SpO2:  98%   9.Neurogenic bowel and bladder. Appears to be continent. Continue softeners/laxative   -oob to void   -am suppository prn-needs bowel movement today     -Add fleets enema if needed     -give sorbitol today 10. Fever: resolved 12. Mild anemia: hgb has been stable from 10-11. Likely related to chronic disease   -no supplements at present 13. Inguinal hernia:  Large, inhibiting at times. Scrotal sling ordered   LOS (Days) 18 A FACE TO FACE EVALUATION WAS PERFORMED  Erick ColaceAndrew E Kirsteins, MD 04/02/2017 8:56 AM

## 2017-04-02 NOTE — Progress Notes (Signed)
Orthopedic Tech Progress Note Patient Details:  Victor [name] [birth year] [id number]  Patient ID: Victor [name], male   DOB: [birth year], 61 y.o.   MRN: [id number]   Nikki DomCrawford, Jodie Cavey 04/02/2017, 10:58 AM Called in [employer] brace order; spoke with answering service

## 2017-04-03 ENCOUNTER — Inpatient Hospital Stay (HOSPITAL_COMMUNITY): Payer: PRIVATE HEALTH INSURANCE

## 2017-04-03 NOTE — Progress Notes (Signed)
PHYSICAL MEDICINE & REHABILITATION     PROGRESS NOTE    Subjective/Complaints: In bed, had LE pain relieved with meds last noc  ROS: pt denies nausea, vomiting, diarrhea, cough, shortness of breath or chest pain   Objective: Vital Signs: Blood pressure 127/75, pulse 88, temperature 97.7 F (36.5 C), temperature source Oral, resp. rate 18, height 5\' 7"  (1.702 m), weight 68.4 kg (150 lb 12.7 oz), SpO2 97 %. No results found. Recent Labs    04/01/17 0428  WBC 5.5  HGB 10.1*  HCT 32.9*  PLT 346   Recent Labs    04/01/17 0428  NA 137  K 4.7  CL 102  GLUCOSE 97  BUN 13  CREATININE 1.11  CALCIUM 9.2   CBG (last 3)  No results for input(s): GLUCAP in the last 72 hours.  Wt Readings from Last 3 Encounters:  04/03/17 68.4 kg (150 lb 12.7 oz)  03/14/17 76.5 kg (168 lb 10.4 oz)  02/22/17 69.9 kg (154 lb)    Physical Exam:  Constitutional: He appearswell-developedand well-nourished.No distress.  HENT:  Head:Normocephalic.  Eyes:EOMare normal.  Neck:Normal range of motion.Neck supple.No thyromegalypresent.  Cardiovascular: RRR without murmur. No JVD           Respiratory:CTA Bilaterally without wheezes or rales. Normal effort      ZO:XWRUGI:Soft.Bowel sounds are normal. He exhibitsno distension.  Genitourinary:  Skin.   Intact  neurological. Cognitively appropriate. No gross nystagmus seen with confrontation. UE 5/5 bilaterally.Bilateral hip flexion is  2- out of 5,  knee extension  1+ to 2- out of 5 hip abduction and abduction are 1+ out of 5. Bilateral ankle dorsiflexion and plantar flexion are 2 to 2+out of 5.  Motor exam unchanged    Psych: cooperative/flat     Assessment/Plan: 1. 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 Physiatrist is providing close team supervision and 24 hour management of active medical problems listed  below. Physiatrist and rehab team continue to assess barriers to discharge/monitor patient progress toward functional and medical goals.  Function:  Bathing Bathing position Bathing activity did not occur: N/A Position: Shower  Bathing parts Body parts bathed by patient: Right arm, Left arm, Chest, Abdomen, Front perineal area, Right upper leg, Left upper leg, Buttocks Body parts bathed by helper: Buttocks, Left lower leg, Right lower leg  Bathing assist Assist Level: Touching or steadying assistance(Pt > 75%)      Upper Body Dressing/Undressing Upper body dressing Upper body dressing/undressing activity did not occur: N/A What is the patient wearing?: Pull over shirt/dress     Pull over shirt/dress - Perfomed by patient: Thread/unthread right sleeve, Thread/unthread left sleeve, Put head through opening, Pull shirt over trunk   Button up shirt - Perfomed by patient: Thread/unthread right sleeve, Thread/unthread left sleeve, Pull shirt around back, Button/unbutton shirt      Upper body assist Assist Level: Set up   Set up : To obtain clothing/put away  Lower Body Dressing/Undressing Lower body dressing Lower body dressing/undressing activity did not occur: N/A What is the patient wearing?: Pants, Underwear     Pants- Performed by patient: Pull pants up/down Pants- Performed by helper: Thread/unthread left pants leg, Thread/unthread right pants leg Non-skid slipper socks- Performed by patient: Don/doff right sock, Don/doff left sock Non-skid slipper socks- Performed by helper: Don/doff right sock, Don/doff left sock  TED Hose - Performed by helper: Don/doff right TED hose, Don/doff left TED hose  Lower body assist Assist for lower body dressing: (MAX A)      Toileting Toileting Toileting activity did not occur: N/A Toileting steps completed by patient: Adjust clothing prior to toileting, Performs perineal hygiene, Adjust clothing after toileting Toileting  steps completed by helper: Adjust clothing prior to toileting, Adjust clothing after toileting, Performs perineal hygiene    Toileting assist Assist level: Set up/obtain supplies   Transfers Chair/bed transfer   Chair/bed transfer method: Squat pivot, Stand pivot Chair/bed transfer assist level: Touching or steadying assistance (Pt > 75%) Chair/bed transfer assistive device: Armrests, Walker, Orthosis     Locomotion Ambulation     Max distance: 13 Assist level: Touching or steadying assistance (Pt > 75%)   Wheelchair   Type: Manual Max wheelchair distance: 150 Assist Level: Supervision or verbal cues  Cognition Comprehension Comprehension assist level: Follows complex conversation/direction with extra time/assistive device  Expression Expression assist level: Expresses basic 90% of the time/requires cueing < 10% of the time.  Social Interaction Social Interaction assist level: Interacts appropriately 90% of the time - Needs monitoring or encouragement for participation or interaction.  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 Status post thrombectomy of left brachial artery, unsuccessful despite multiple attempts by vascular surgery 03/08/2017 as well as recent aortic arch replacement followed byTEVARprocedure 01/21/2017 -Continue physical and occupational therapies.    2. DVT Prophylaxis/Anticoagulation: SCDs.  Dopplers   negative 3. Pain Management:Hydrocodone as needed  -continue MS Contin 15 mg every 12 hours  , no changes 4. Mood:Provide emotional support. Neuro-psych notes reviewed   -continued scheduled trazodone to help with sleep 5. Neuropsych: This patientiscapable of making decisions on hisown  behalf. 6. Skin/Wound Care:Routine skin checks 7. Fluids/Electrolytes/Nutrition:I personally reviewed the patient's labs today.   Marland Kitchen.  8.Hypertension.   Lopressor 12.5 mg twice a day.     -contine lopressor, holding Cozaar 25 mg   -Blood pressure controlled 12/9 Vitals:   04/03/17 0358 04/03/17 0857  BP: 108/62 127/75  Pulse: 94 88  Resp: 18   Temp: 97.7 F (36.5 C)   SpO2: 97%    9.Neurogenic bowel and bladder. Appears to be continent. Continue softeners/laxative   -oob to void   -am suppository prn-needs bowel movement today     -Add fleets enema if needed     -give sorbitol today 10. Fever: resolved 12. Mild anemia: hgb has been stable from 10-11. Likely related to chronic disease   -no supplements at present 13. Inguinal hernia:  Large, inhibiting at times. Scrotal sling ordered   LOS (Days) 19 A FACE TO FACE EVALUATION WAS PERFORMED  Erick ColaceAndrew E Kirsteins, MD 04/03/2017 10:42 AM

## 2017-04-03 NOTE — Progress Notes (Signed)
Occupational Therapy Note  Patient Details  Name: Karna ChristmasMarcus E Hyle MRN: [id number] Date of Birth: December 06, 1955  Today's Date: 04/03/2017 OT Individual Time: 1000-1030 OT Individual Time Calculation (min): 30 min   Pt denies pain Individual Therapy   Pt resting in recliner upon arrival.  Focus on functional transfers, sit<>stand, and standing balance (without AFO). Pt requires min A for sit<>stand and steady A for stand pivot transfers.  Pt performs scoot transfers at supervision level.  Pt remained in w/c at sink performing grooming tasks.  RN and NT notified. [name], Ronika Kelson [facility] Center[facility]app[facility] 04/03/2017, 10:31 AM

## 2017-04-04 ENCOUNTER — Inpatient Hospital Stay (HOSPITAL_COMMUNITY): Payer: PRIVATE HEALTH INSURANCE

## 2017-04-04 ENCOUNTER — Inpatient Hospital Stay (HOSPITAL_COMMUNITY): Payer: PRIVATE HEALTH INSURANCE | Admitting: Physical Therapy

## 2017-04-04 NOTE — Progress Notes (Signed)
Occupational Therapy Session Note  Patient Details  Name: Victor [name] MRN: [id number] Date of Birth: 1955-12-08  Today's Date: 04/04/2017 OT Individual Time: 1400-1503 OT Individual Time Calculation (min): 63 min    Short Term Goals: Week 3:  OT Short Term Goal 1 (Week 3): STG=LTG secondary to ELOS  Skilled Therapeutic Interventions/Progress Updates:    Pt seen for OT session focusing on functional transfers and ADL re-training. Pt in supine upon arrival and with encouragement willing to participate in OOB session. Discussed d/c planning and plans for parents to assist at d/c. Therefore, session focus on pt directing care with set-up of equipemnet and technique for stand pivot and sliding board transfers.  Completed sliding board transfers throughout session with min A overall, required VCs and demonstration for proper set-up of board in prep for transfer. Completed EOB>w/c and w/c <> drop arm BSC. Practiced lateral leans for [facility]ng [facility]. He then desired to try transfer via stand pivot. Set-up BSC in simulation of home bathroom environment and completed transfer with min A and VCs for RW management. Following rest break and with lots of encouragement, pt willing to complete oral care from standing position. Completed with steadying assist and requiring one UE support on sink ledge. Pt tolerated ~30 seconds before requiring seated rest break.  Pt then desired to don scrotal sac support for hernia, donned with assist, however, pt not happy with fit and desired not to wear now. Completed sliding board transfer to recliner at end of session, with encouragement willing to stay sitting up. All needs in reach.   Therapy Documentation Precautions:  Precautions Precautions: Fall Precaution Comments: paraparesis Restrictions Weight Bearing Restrictions: No Pain: Pain Assessment Pain Assessment: 0-10 Pain Score: 4  Pain Intervention(s): RN made aware, repositioned  See  Function Navigator for Current Functional Status.   Therapy/Group: Individual Therapy  Victor [name] L 04/04/2017, 12:47 PM

## 2017-04-04 NOTE — Progress Notes (Signed)
Physical Therapy Session Note  Patient Details  Name: Victor [name] MRN: [id number] Date of Birth: 01/10/1956  Today's Date: 04/04/2017 PT Individual Time: 1500-1530 PT Individual Time Calculation (min): 30 min   Short Term Goals: Week 3:  PT Short Term Goal 1 (Week 3): LTG due to short ELOS PT Short Term Goal 2 (Week 3): LTG due to short ELOS PT Short Term Goal 3 (Week 3): LTG due to short ELOS PT Short Term Goal 4 (Week 3): LTG due to short ELOS  Skilled Therapeutic Interventions/Progress Updates: Pt presented in recliner agreeable to therapy. PTA donned shoes and LAFO total assist for time management. Performed squat pivot min guard to w/c and transported to rehab gym. Pt attempted stair training this session however pt unable to lift L/RLE to clear 3in step. Pt attempted standing march in front of stairs and able to lift LLE x1 time. Pt stating some pain but didn't feel that was limiting however nsg was notified for request of pain meds. Pt transported back to room and returned to recliner in same manner as prior and left in chair with call bell within reach and needs met.      Therapy Documentation Precautions:  Precautions Precautions: Fall Precaution Comments: paraparesis Restrictions Weight Bearing Restrictions: No General:   Vital Signs: Therapy Vitals Temp: 98 F (36.7 C) Temp Source: Oral Pulse Rate: 93 Resp: 17 BP: 101/61 Patient Position (if appropriate): Lying Oxygen Therapy SpO2: 98 % O2 Device: Not Delivered   See Function Navigator for Current Functional Status.   Therapy/Group: Individual Therapy  [name]  Aretha Levi, PTA  04/04/2017, 4:46 PM

## 2017-04-04 NOTE — Progress Notes (Signed)
Physical Therapy Session Note  Patient Details  Name: Victor [name] MRN: [id number] Date of Birth: April 01, 1956  Today's Date: 04/04/2017 PT Individual Time: 1005-1100 PT Individual Time Calculation (min): 55 min   Short Term Goals: Week 3:  PT Short Term Goal 1 (Week 3): LTG due to short ELOS PT Short Term Goal 2 (Week 3): LTG due to short ELOS PT Short Term Goal 3 (Week 3): LTG due to short ELOS PT Short Term Goal 4 (Week 3): LTG due to short ELOS  Skilled Therapeutic Interventions/Progress Updates:    Patient in recliner assist to don TED hose, shoes and AFO on L.  Patient transferred to w/c S with sliding board after placement.  He propelled w/c S to therapy gym.  Sit to stand x 5 for LE strength and technique to RW with close S.  Patient ambulated 18' min A with RW and AFO mod cues for safety with turning to sit due to fatigue.  Patient transferred to mat S after board placed.  Sit to supine mod A for LE's.  On mat for stretching LE's to tolerance for hip extensors, hamstrings and hip adductors.  Patient c/o pain throughout with stretching so did not apply much force.  Patient supine to sit mod A for LE's.  REports supposed to get leg straps.  Also supposed to get AFO.  Patient will need AFO for gait in the home.  Discussed concern about stairs at home and pt states does not want to go to "rest home".  Will need to address prior to d/c.  Pt assist to recliner in room S with slide board.  Left with all needs in reach.  Therapy Documentation Precautions:  Precautions Precautions: Fall Precaution Comments: paraparesis Restrictions Weight Bearing Restrictions: No Pain: Pain Assessment Pain Score: 0-No pain   See Function Navigator for Current Functional Status.   Therapy/Group: Individual Therapy  Elray McgregorCynthia Amritha Yorke 04/04/2017, 4:51 PM

## 2017-04-04 NOTE — Progress Notes (Signed)
[facility] PHYSICAL MEDICINE & REHABILITATION     PROGRESS NOTE    Subjective/Complaints: Sitting in chair getting ready for therapies.  I asked him if the scrotal sling was helping but he tells me it was not applied at all since being delivered    ROS: pt denies nausea, vomiting, diarrhea, cough, shortness of breath or chest pain   Objective: Vital Signs: Blood pressure 101/61, pulse 93, temperature 98 F (36.7 C), temperature source Oral, resp. rate 17, height 5\' 7"  (1.702 m), weight 68 kg (149 lb 14.6 oz), SpO2 98 %. No results found. No results for input(s): WBC, HGB, HCT, PLT in the last 72 hours. No results for input(s): NA, K, CL, GLUCOSE, BUN, CREATININE, CALCIUM in the last 72 hours.  Invalid input(s): CO CBG (last 3)  No results for input(s): GLUCAP in the last 72 hours.  Wt Readings from Last 3 Encounters:  04/04/17 68 kg (149 lb 14.6 oz)  03/14/17 76.5 kg (168 lb 10.4 oz)  02/22/17 69.9 kg (154 lb)    Physical Exam:  Constitutional: He appearswell-developedand well-nourished.No distress.  HENT:  Head:Normocephalic.  Eyes:EOMare normal.  Neck:Normal range of motion.Neck supple.No thyromegalypresent.  Cardiovascular: RRR without murmur. No JVD           Respiratory:CTA Bilaterally without wheezes or rales. Normal effort   YQ:IHKVGI:Soft.Bowel sounds are normal. He exhibitsno distension.  Genitourinary: Scrotal prominence Skin.   Intact  neurological. Cognitively appropriate. No gross nystagmus seen with confrontation. UE 5/5 bilaterally.Bilateral hip flexion is  2- out of 5,  knee extension  1+ to 2- out of 5 hip abduction and abduction are 1+ out of 5. Bilateral ankle dorsiflexion and plantar flexion are 2 to 2+out of 5.   Motor exam stable    Psych: cooperative/flat     Assessment/Plan: 1. 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 Physiatrist is providing close team supervision and 24 hour management of active medical problems listed below. Physiatrist and rehab team continue to assess barriers to discharge/monitor patient progress toward functional and medical goals.  Function:  Bathing Bathing position Bathing activity did not occur: N/A Position: Shower  Bathing parts Body parts bathed by patient: Right arm, Left arm, Chest, Abdomen, Front perineal area, Right upper leg, Left upper leg, Buttocks Body parts bathed by helper: Buttocks, Left lower leg, Right lower leg  Bathing assist Assist Level: Touching or steadying assistance(Pt > 75%)      Upper Body Dressing/Undressing Upper body dressing Upper body dressing/undressing activity did not occur: N/A What is the patient wearing?: Pull over shirt/dress     Pull over shirt/dress - Perfomed by patient: Thread/unthread right sleeve, Thread/unthread left sleeve, Put head through opening, Pull shirt over trunk   Button up shirt - Perfomed by patient: Thread/unthread right sleeve, Thread/unthread left sleeve, Pull shirt around back, Button/unbutton shirt      Upper body assist Assist Level: Set up   Set up : To obtain clothing/put away  Lower Body Dressing/Undressing Lower body dressing Lower body dressing/undressing activity did not occur: N/A What is the patient wearing?: Pants, Underwear     Pants- Performed by patient: Pull pants up/down Pants- Performed by helper: Thread/unthread left pants leg, Thread/unthread right pants leg Non-skid slipper socks- Performed by patient: Don/doff right sock, Don/doff left sock Non-skid slipper socks- Performed by helper: Don/doff right sock, Don/doff left sock  TED Hose - Performed by helper: Don/doff right TED hose, Don/doff left TED hose  Lower body assist Assist for lower body dressing: (MAX A)      Toileting Toileting Toileting activity did not occur: N/A Toileting steps completed by  patient: Adjust clothing prior to toileting, Performs perineal hygiene, Adjust clothing after toileting Toileting steps completed by helper: Adjust clothing prior to toileting, Adjust clothing after toileting, Performs perineal hygiene    Toileting assist Assist level: Set up/obtain supplies   Transfers Chair/bed transfer   Chair/bed transfer method: Squat pivot, Stand pivot Chair/bed transfer assist level: Touching or steadying assistance (Pt > 75%) Chair/bed transfer assistive device: Armrests, Walker, Orthosis     Locomotion Ambulation     Max distance: 13 Assist level: Touching or steadying assistance (Pt > 75%)   Wheelchair Wheelchair activity did not occur: Refused Type: Manual Max wheelchair distance: 150 Assist Level: Supervision or verbal cues  Cognition Comprehension Comprehension assist level: Follows complex conversation/direction with extra time/assistive device  Expression Expression assist level: Expresses basic 90% of the time/requires cueing < 10% of the time.  Social Interaction Social Interaction assist level: Interacts appropriately 90% of the time - Needs monitoring or encouragement for participation or interaction.  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 Status post thrombectomy of left brachial artery, unsuccessful despite multiple attempts by vascular surgery 03/08/2017 as well as recent aortic arch replacement followed byTEVARprocedure 01/21/2017 -Continue physical and occupational therapies.     Discussed potential for custom left sided AFO with physical therapy.    - 2. DVT Prophylaxis/Anticoagulation: SCDs.  Dopplers   negative 3. Pain Management:Hydrocodone as needed  -continue MS  Contin 15 mg every 12 hours  , no changes 4. Mood:Provide emotional support. Neuro-psych notes reviewed   -continued scheduled trazodone to help with sleep 5. Neuropsych: This patientiscapable of making decisions on hisown behalf. 6. Skin/Wound Care:Routine skin checks 7. Fluids/Electrolytes/Nutrition:I personally reviewed the patient's labs today.   Marland Kitchen.  8.Hypertension.   Lopressor 12.5 mg twice a day.     -contine lopressor, holding Cozaar 25 mg   -Blood pressure controlled 12/10 Vitals:   04/04/17 0940 04/04/17 1340  BP: 106/60 101/61  Pulse: 78 93  Resp:  17  Temp:  98 F (36.7 C)  SpO2:  98%   9.Neurogenic bowel and bladder. Appears to be continent. Continue softeners/laxative   -oob to void   -am suppository prn-needs bowel movement today     -Add fleets enema if needed     -give sorbitol today 10. Fever: resolved 12. Mild anemia: hgb has been stable from 10-11. Likely related to chronic disease   -no supplements at present 13. Inguinal hernia:  Large, inhibiting at times. Scrotal sling ordered but not being applied   LOS (Days) 20 A FACE TO FACE EVALUATION WAS PERFORMED  Ranelle OysterSWARTZ,ZACHARY T, MD 04/04/2017 3:29 PM

## 2017-04-05 ENCOUNTER — Inpatient Hospital Stay (HOSPITAL_COMMUNITY): Payer: PRIVATE HEALTH INSURANCE

## 2017-04-05 ENCOUNTER — Other Ambulatory Visit: Payer: Self-pay

## 2017-04-05 ENCOUNTER — Inpatient Hospital Stay (HOSPITAL_COMMUNITY): Payer: PRIVATE HEALTH INSURANCE | Admitting: Physical Therapy

## 2017-04-05 NOTE — Patient Care Conference (Signed)
Inpatient RehabilitationTeam Conference and Plan of Care Update Date: 04/05/2017   Time: 2:15 PM    Patient Name: Victor [name]      Medical Record Number: [id number]  Date of Birth: 31-Aug-1955 Sex: Male         Room/Bed: 4W17C/4W17C-01 Payor Info: Payor: MEDICAID PENDING / Plan: MEDICAID PENDING / Product Type: *No Product type* /    Admitting Diagnosis:  Thoracic spinal cord infaracts with paraplegis  Admit Date/Time:  03/15/2017  3:40 PM Admission Comments: No comment available   Primary Diagnosis:  <principal problem not specified> Principal Problem: <principal problem not specified>  Patient Active Problem List   Diagnosis Date Noted  . Spinal cord infarction (HCC) 03/15/2017  . Paraplegia (HCC) 03/07/2017  . Bilateral leg weakness 03/07/2017  . S/P thoracic aortic aneurysm repair 01/21/2017  . Coronary artery calcification 12/23/2016  . S/P aortic dissection repair 12/23/2016  . Thoracic aortic aneurysm without rupture (HCC) 12/23/2016  . Preoperative cardiovascular examination 12/23/2016  . Essential hypertension 09/23/2015  . Iron deficiency anemia 01/28/2015  . Weight loss 10/30/2014  . Hospital-acquired pneumonia 10/09/2014  . Ischemic leg 10/09/2014  . Aortic dissection (HCC) 10/07/2014    Expected Discharge Date: Expected Discharge Date: 04/06/17  Team Members Present: Physician leading conference: Dr. Faith RogueZachary Swartz Social Worker Present: Amada JupiterLucy Jarely Juncaj, LCSW Nurse Present: Kennon PortelaJeanna Hicks, RN PT Present: Alyson ReedyElizabeth Tygielski, PT OT Present: Ardis Rowanom Lanier, COTA;Jennifer Katrinka BlazingSmith, OT PPS Coordinator present : Tora DuckMarie Noel, RN, CRRN     Current Status/Progress Goal Weekly Team Focus  Medical   Ongoing progress.  Scrotal edema remains an issue.  Considering custom AFO left lower extremity     See prior, pain control   Bowel/Bladder   Continent of Bladder/bladder with stool softener and prn medication as ordered. LBM 12/10/ voids in urinal , Scrotum edema   Maintain  continence and skin integrity   Assess QS and prn, provide medication to patient within 2-3 days to prevent constipation ,has prn orders    Swallow/Nutrition/ Hydration             ADL's   bathing-min A; LB dressing-mod A; functional transfers-min   min A overall  functional traansfers; standing balance, activity tolerance, safety awareness, discharge planning   Mobility   minA bed mobility, S transfers, minA gait with RW x20'  S w/c level, minA gait x20'  pt/family education, d/c planning, transfer/gait training   Communication             Safety/Cognition/ Behavioral Observations  Alert oriented x3,encourage patients participation in care and safety.  No falls or injuries while on Rehab  Maintain safety  and preventive measures to prevent falls   Pain   Pain continue to persist with schedule MSIR q12 hrs , prn Norco continue- rating on- going legs and back pain 7/10 on pain scale, repostioning, with encouragement continue     pain is on-going score remain at tolerate level 7/10 ,medication does not releve pain   Cont to assess and provide medication, comfort measures    Skin              Rehab Goals Patient on target to meet rehab goals: Yes *See Care Plan and progress notes for long and short-term goals.     Barriers to Discharge  Current Status/Progress Possible Resolutions Date Resolved   Physician    Medical stability;Neurogenic Bowel & Bladder        Ongoing medical management      Nursing  PT                    OT                  SLP                SW                Discharge Planning/Teaching Needs:  Pt to return home with his parents who both work p/t but family can provide 24/7 assistance.  Family ed completed   Team Discussion:  No concerns.  Pt ready for d/c tomorrow with HH follow up  Revisions to Treatment Plan:  None    Continued Need for Acute Rehabilitation Level of Care: The patient requires daily medical management by a  physician with specialized training in physical medicine and rehabilitation for the following conditions: Daily direction of a multidisciplinary physical rehabilitation program to ensure safe treatment while eliciting the highest outcome that is of practical value to the patient.: Yes Daily medical management of patient stability for increased activity during participation in an intensive rehabilitation regime.: Yes Daily analysis of laboratory values and/or radiology reports with any subsequent need for medication adjustment of medical intervention for : Neurological problems  Victor [name] 04/05/2017, 2:21 PM

## 2017-04-05 NOTE — Progress Notes (Signed)
Physical Therapy Session Note  Patient Details  Name: Victor [name] MRN: [id number] Date of Birth: August 16, 1955  Today's Date: 04/05/2017 PT Individual Time: 1500-1630 PT Individual Time Calculation (min): 90 min   Short Term Goals: Week 3:  PT Short Term Goal 1 (Week 3): LTG due to short ELOS PT Short Term Goal 2 (Week 3): LTG due to short ELOS PT Short Term Goal 3 (Week 3): LTG due to short ELOS PT Short Term Goal 4 (Week 3): LTG due to short ELOS  Skilled Therapeutic Interventions/Progress Updates:    Patient in w/c requesting to shave.  Seated in w/c to shave with electric razor independent, able to reach out of BOS to plug in and unplug shaver.  Donned shoes in prep for leaving room with A and pt propelled mod I to therapy gym.  Sit to stand min a and ambulated 12' min A with RW no brace.  Noted at times some difficulty initiating swing on L due to decreased ankle DF, but did not trip or stumble or notice any knee instability.  Discussed why not to brace at this time due to anticipate increased function/recovery and need for avoiding immobilization of the ankle while recovering.  Patient sit to supine with assist for LE therex consisting of sidelying clamshell hip abduction, powder board for hip flexion, extension and knee flex and extension.  Assist for L hip flexion.  Patient returned to sit help for leg positioning.  Patient transferred to w/c S and propelled to elevator with mod I.  C/o dizziness after exited elevator so assist to propel to doorway on second floor.  Pt propelled on outdoor uneven surfaces with S and cues x 100'.  Assist to elevator and pt propelled to room.  Assisted to bed with S after w/c set up.  Patient left in supine with all needs in reach.   Therapy Documentation Precautions:  Precautions Precautions: Fall Precaution Comments: paraparesis Restrictions Weight Bearing Restrictions: No Pain: Pain Assessment Pain Assessment: No/denies pain   See Function  Navigator for Current Functional Status.   Therapy/Group: Individual Therapy  Elray McgregorCynthia Wynn 04/05/2017, 4:52 PM

## 2017-04-05 NOTE — Progress Notes (Signed)
Occupational Therapy Discharge Summary  Patient Details  Name: Victor [name] MRN: [id number] Date of Birth: Dec 08, 1955  Patient has met 10 of 10 long term goals due to improved activity tolerance, improved balance, postural control, ability to compensate for deficits and functional use of  RIGHT lower and LEFT lower extremity.  Pt made steady progress with BADLs during this admission.  Pt requires min A for bathing, LB dressing tasks, toileting tasks, and dynamic standing.  Pt performs toilet transfers with RW at supervision level.  Pt is independent with directing care but requires encouragement to attempt new tasks.  Pt's mother and sister have been present and participated in therapy.  Patient to discharge at [facility] Assist level.  Patient's care partner is independent to provide the necessary physical assistance at discharge.      Recommendation:  No f/u at this time  Equipment: BSC and Tub Transfer Bench  Reasons for discharge: treatment goals met and discharge from hospital  Patient/family agrees with progress made and goals achieved: Yes  OT Discharge Baseline Vision/History: No visual deficits Patient Visual Report: No change from baseline Perception  Perception: Within Functional Limits Praxis Praxis: Intact Cognition Overall Cognitive Status: Within Functional Limits for tasks assessed Arousal/Alertness: Awake/alert Orientation Level: Oriented X4 Memory: Appears intact Awareness: Appears intact Problem Solving: Appears intact Safety/Judgment: Appears intact Sensation Sensation Light Touch: Appears Intact Stereognosis: Not tested Hot/Cold: Appears Intact Proprioception: Appears Intact Motor  Motor Motor: Paraplegia Motor - Skilled Clinical Observations: paraparesis    Trunk/Postural Assessment  Cervical Assessment Cervical Assessment: Within Functional Limits Thoracic Assessment Thoracic Assessment: Within Functional Limits Lumbar Assessment Lumbar  Assessment: Within Functional Limits  Balance Static Sitting Balance Static Sitting - Balance Support: No upper extremity supported Static Sitting - Level of Assistance: 6: Modified independent (Device/Increase time) Dynamic Sitting Balance Dynamic Sitting - Balance Support: Feet supported;Right upper extremity supported Dynamic Sitting - Level of Assistance: 6: Modified independent (Device/Increase time) Extremity/Trunk Assessment RUE Assessment RUE Assessment: Within Functional Limits LUE Assessment LUE Assessment: Within Functional Limits   See Function Navigator for Current Functional Status.  Leotis Shames [name] 04/05/2017, 2:59 PM

## 2017-04-05 NOTE — Progress Notes (Addendum)
[place] PHYSICAL MEDICINE & REHABILITATION     PROGRESS NOTE    Subjective/Complaints: Lying in bed.  No new issues today.  States that scrotal sling does not seem to work that well/fit appropriately.  ROS: pt denies nausea, vomiting, diarrhea, cough, shortness of breath or chest pain   Objective: Vital Signs: Blood pressure 114/72, pulse 90, temperature 98 F (36.7 C), temperature source Oral, resp. rate 17, height 5\' 7"  (1.702 m), weight 72.7 kg (160 lb 4.4 oz), SpO2 100 %. No results found. No results for input(s): WBC, HGB, HCT, PLT in the last 72 hours. No results for input(s): NA, K, CL, GLUCOSE, BUN, CREATININE, CALCIUM in the last 72 hours.  Invalid input(s): CO CBG (last 3)  No results for input(s): GLUCAP in the last 72 hours.  Wt Readings from Last 3 Encounters:  04/05/17 72.7 kg (160 lb 4.4 oz)  03/14/17 76.5 kg (168 lb 10.4 oz)  02/22/17 69.9 kg (154 lb)    Physical Exam:  Constitutional: He appearswell-developedand well-nourished.No distress.  HENT:  Head:Normocephalic.  Eyes:EOMare normal.  Neck:Normal range of motion.Neck supple.No thyromegalypresent.  Cardiovascular: RRR without murmur. No JVD            Respiratory: CTA Bilaterally without wheezes or rales. Normal effort   HY:QMVHGI:Soft.Bowel sounds are normal. He exhibitsno distension.  Genitourinary: Scrotal/hernia Skin.   Intact  neurological. Cognitively appropriate. No gross nystagmus seen with confrontation. UE 5/5 bilaterally.Bilateral hip flexion is  2- out of 5,  knee extension  1+ to 2- out of 5 hip abduction and abduction are 1+ out of 5. Bilateral ankle dorsiflexion and plantar flexion are 2 to 2+out of 5.   Motor exam essentially unchanged    Psych: cooperative/flat     Assessment/Plan: 1. 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 Physiatrist is providing  close team supervision and 24 hour management of active medical problems listed below. Physiatrist and rehab team continue to assess barriers to discharge/monitor patient progress toward functional and medical goals.  Function:  Bathing Bathing position Bathing activity did not occur: N/A Position: Shower  Bathing parts Body parts bathed by patient: Right arm, Left arm, Chest, Abdomen, Front perineal area, Right upper leg, Left upper leg, Buttocks Body parts bathed by helper: Buttocks, Left lower leg, Right lower leg  Bathing assist Assist Level: Touching or steadying assistance(Pt > 75%)      Upper Body Dressing/Undressing Upper body dressing Upper body dressing/undressing activity did not occur: N/A What is the patient wearing?: Pull over shirt/dress     Pull over shirt/dress - Perfomed by patient: Thread/unthread right sleeve, Thread/unthread left sleeve, Put head through opening, Pull shirt over trunk   Button up shirt - Perfomed by patient: Thread/unthread right sleeve, Thread/unthread left sleeve, Pull shirt around back, Button/unbutton shirt      Upper body assist Assist Level: Set up   Set up : To obtain clothing/put away  Lower Body Dressing/Undressing Lower body dressing Lower body dressing/undressing activity did not occur: N/A What is the patient wearing?: Pants, Underwear     Pants- Performed by patient: Pull pants up/down Pants- Performed by helper: Thread/unthread left pants leg, Thread/unthread right pants leg Non-skid slipper socks- Performed by patient: Don/doff right sock, Don/doff left sock Non-skid slipper socks- Performed by helper: Don/doff right sock, Don/doff left sock               TED Hose -  Performed by helper: Don/doff right TED hose, Don/doff left TED hose  Lower body assist Assist for lower body dressing: (MAX A)      Toileting Toileting Toileting activity did not occur: N/A Toileting steps completed by patient: Adjust clothing prior to  toileting, Performs perineal hygiene, Adjust clothing after toileting Toileting steps completed by helper: Adjust clothing prior to toileting, Adjust clothing after toileting, Performs perineal hygiene    Toileting assist Assist level: Touching or steadying assistance (Pt.75%)   Transfers Chair/bed transfer   Chair/bed transfer method: Lateral scoot Chair/bed transfer assist level: Supervision or verbal cues Chair/bed transfer assistive device: Orthosis, Sliding board     Locomotion Ambulation     Max distance: 18 Assist level: Touching or steadying assistance (Pt > 75%)   Wheelchair Wheelchair activity did not occur: Refused Type: Manual Max wheelchair distance: 150 Assist Level: Supervision or verbal cues  Cognition Comprehension Comprehension assist level: Follows complex conversation/direction with extra time/assistive device  Expression Expression assist level: Expresses basic 90% of the time/requires cueing < 10% of the time.  Social Interaction Social Interaction assist level: Interacts appropriately 90% of the time - Needs monitoring or encouragement for participation or interaction.  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 Status post thrombectomy of left brachial artery, unsuccessful despite multiple attempts by vascular surgery 03/08/2017 as well as recent aortic arch replacement followed byTEVARprocedure 01/21/2017 -Continue physical and occupational therapies.     -Team conference today    -I would like patient to wear PRAFO's every night while in bed    -?custome left AFO?.    - 2. DVT Prophylaxis/Anticoagulation: SCDs.  Dopplers   negative 3. Pain Management:Hydrocodone as needed  -continue MS  Contin 15 mg every 12 hours  , no changes 4. Mood:Provide emotional support. Neuro-psych notes reviewed   -continued scheduled trazodone to help with sleep 5. Neuropsych: This patientiscapable of making decisions on hisown behalf. 6. Skin/Wound Care:Routine skin checks 7. Fluids/Electrolytes/Nutrition:I personally reviewed the patient's labs today.   Marland Kitchen.  8.Hypertension.   Lopressor 12.5 mg twice a day.     -contine lopressor, holding Cozaar 25 mg   -Blood pressure controlled 12/10 Vitals:   04/04/17 1340 04/04/17 2121  BP: 101/61 114/72  Pulse: 93 90  Resp: 17   Temp: 98 F (36.7 C)   SpO2: 98% 100%   9.Neurogenic bowel and bladder. Appears to be continent. Continue softeners/laxative   -oob to void   -am suppository prn-moves bowels on 12/10    -Addedleets enema if needed     -give sorbitol today 10. Fever: resolved 12. Mild anemia: hgb has been stable from 10-11. Likely related to chronic disease   -no supplements at present 13. Inguinal hernia:  Large, inhibiting at times. Scrotal sling ordered but perhaps too small.  Order size extra-large   LOS (Days) 21 A FACE TO FACE EVALUATION WAS PERFORMED  Ranelle OysterSWARTZ,Kenley Troop T, MD 04/05/2017 9:16 AM

## 2017-04-05 NOTE — Progress Notes (Signed)
Physical Therapy Session Note  Patient Details  Name: Victor ChristmasMarcus E Boys MRN: [id number] Date of Birth: 1955-11-30  Today's Date: 04/05/2017 PT Individual Time: 1100-1200 PT Individual Time Calculation (min): 60 min   Short Term Goals: Week 3:  PT Short Term Goal 1 (Week 3): LTG due to short ELOS PT Short Term Goal 2 (Week 3): LTG due to short ELOS PT Short Term Goal 3 (Week 3): LTG due to short ELOS PT Short Term Goal 4 (Week 3): LTG due to short ELOS  Skilled Therapeutic Interventions/Progress Updates: Pt received supine in bed, denies pain and agreeable to treatment. Performed BLE hip flexion/external rotation self PROM in bed after therapist gave pt handouts for reference. Supine>sit minA for LE management. Transfer bed>w/c with S squat pivot. W/c propulsion modI throughout session, requires increased time to don/doff leg rests. Ambulated x10' with RW and min guard; slow non-functional speed. Performed car transfer with transfer board and setupA; pt able to manage own legs in/out of car without assist. Discussed recommendation that pt have w/c bumped up/down stairs by two strong family members; provided handout with written instructions on how to perform this as when family education was performed pt was reporting he would stay in the downstairs level and not need to perform stairs. Also provided handout with information for building ramp, and stressed to patient heavy burden of care required for bumping up/down stairs. Remained seated in w/c at end of session, all needs in reach.      Therapy Documentation Precautions:  Precautions Precautions: Fall Precaution Comments: paraparesis Restrictions Weight Bearing Restrictions: No   See Function Navigator for Current Functional Status.   Therapy/Group: Individual Therapy  Vista Lawmanlizabeth J Tygielski 04/05/2017, 11:58 AM

## 2017-04-05 NOTE — Patient Care Conference (Signed)
Inpatient RehabilitationTeam Conference and Plan of Care Update Date: 04/05/2017   Time: 2:15 PM    Patient Name: Victor [name]      Medical Record Number: [id number]  Date of Birth: 31-Aug-1955 Sex: Male         Room/Bed: 4W17C/4W17C-01 Payor Info: Payor: MEDICAID PENDING / Plan: MEDICAID PENDING / Product Type: *No Product type* /    Admitting Diagnosis:  Thoracic spinal cord infaracts with paraplegis  Admit Date/Time:  03/15/2017  3:40 PM Admission Comments: No comment available   Primary Diagnosis:  <principal problem not specified> Principal Problem: <principal problem not specified>  Patient Active Problem List   Diagnosis Date Noted  . Spinal cord infarction (HCC) 03/15/2017  . Paraplegia (HCC) 03/07/2017  . Bilateral leg weakness 03/07/2017  . S/P thoracic aortic aneurysm repair 01/21/2017  . Coronary artery calcification 12/23/2016  . S/P aortic dissection repair 12/23/2016  . Thoracic aortic aneurysm without rupture (HCC) 12/23/2016  . Preoperative cardiovascular examination 12/23/2016  . Essential hypertension 09/23/2015  . Iron deficiency anemia 01/28/2015  . Weight loss 10/30/2014  . Hospital-acquired pneumonia 10/09/2014  . Ischemic leg 10/09/2014  . Aortic dissection (HCC) 10/07/2014    Expected Discharge Date: Expected Discharge Date: 04/06/17  Team Members Present: Physician leading conference: Dr. Faith RogueZachary Swartz Social Worker Present: Amada JupiterLucy Gwenette Wellons, LCSW Nurse Present: Kennon PortelaJeanna Hicks, RN PT Present: Alyson ReedyElizabeth Tygielski, PT OT Present: Ardis Rowanom Lanier, COTA;Jennifer Katrinka BlazingSmith, OT PPS Coordinator present : Tora DuckMarie Noel, RN, CRRN     Current Status/Progress Goal Weekly Team Focus  Medical   Ongoing progress.  Scrotal edema remains an issue.  Considering custom AFO left lower extremity     See prior, pain control   Bowel/Bladder   Continent of Bladder/bladder with stool softener and prn medication as ordered. LBM 12/10/ voids in urinal , Scrotum edema   Maintain  continence and skin integrity   Assess QS and prn, provide medication to patient within 2-3 days to prevent constipation ,has prn orders    Swallow/Nutrition/ Hydration             ADL's   bathing-min A; LB dressing-mod A; functional transfers-min   min A overall  functional traansfers; standing balance, activity tolerance, safety awareness, discharge planning   Mobility   minA bed mobility, S transfers, minA gait with RW x20'  S w/c level, minA gait x20'  pt/family education, d/c planning, transfer/gait training   Communication             Safety/Cognition/ Behavioral Observations  Alert oriented x3,encourage patients participation in care and safety.  No falls or injuries while on Rehab  Maintain safety  and preventive measures to prevent falls   Pain   Pain continue to persist with schedule MSIR q12 hrs , prn Norco continue- rating on- going legs and back pain 7/10 on pain scale, repostioning, with encouragement continue     pain is on-going score remain at tolerate level 7/10 ,medication does not releve pain   Cont to assess and provide medication, comfort measures    Skin              Rehab Goals Patient on target to meet rehab goals: Yes *See Care Plan and progress notes for long and short-term goals.     Barriers to Discharge  Current Status/Progress Possible Resolutions Date Resolved   Physician    Medical stability;Neurogenic Bowel & Bladder        Ongoing medical management      Nursing  PT                    OT                  SLP                SW                Discharge Planning/Teaching Needs:  Pt to return home with his parents who both work p/t but family can provide 24/7 assistance.  Family ed completed   Team Discussion:  Ready for d/c tomorrow.  Revisions to Treatment Plan:  None    Continued Need for Acute Rehabilitation Level of Care: The patient requires daily medical management by a physician with specialized training in  physical medicine and rehabilitation for the following conditions: Daily direction of a multidisciplinary physical rehabilitation program to ensure safe treatment while eliciting the highest outcome that is of practical value to the patient.: Yes Daily medical management of patient stability for increased activity during participation in an intensive rehabilitation regime.: Yes Daily analysis of laboratory values and/or radiology reports with any subsequent need for medication adjustment of medical intervention for : Neurological problems  [name] 04/05/2017, 3:12 PM

## 2017-04-05 NOTE — Progress Notes (Signed)
Occupational Therapy Session Note  Patient Details  Name: Victor [name] MRN: [id number] Date of Birth: 08-01-1955  Today's Date: 04/05/2017 OT Individual Time: 1300-1355 OT Individual Time Calculation (min): 55 min    Short Term Goals: Week 2:  OT Short Term Goal 1 (Week 2): Pt will perform clothing management after toileting with min A for balance. OT Short Term Goal 1 - Progress (Week 2): Progressing toward goal OT Short Term Goal 2 (Week 2): Pt will perform LB dressing tasks with mod A with sit<>stand from w/c/EOB OT Short Term Goal 2 - Progress (Week 2): Progressing toward goal OT Short Term Goal 3 (Week 2): Pt will perform toilet transfers with min A and LRAD OT Short Term Goal 3 - Progress (Week 2): Met Week 3:  OT Short Term Goal 1 (Week 3): STG=LTG secondary to ELOS  Skilled Therapeutic Interventions/Progress Updates:   Pt engaged in BADL retraining including bathing at shower level and dressing with sit<>stand from w/c at sink.  Pt required min A for stand pivot transfer with RW in<>out of shower.  Pt performed sit<>stand at supervision level and performed toilet transfer at supervision level.  Pt continues to require min a for LB dressing tasks and bathing tasks.  Pt requires min A for dynamic standing tasks at sink.  Pt remained seated in w/c with all needs within reach.   Therapy Documentation Precautions:  Precautions Precautions: Fall Precaution Comments: paraparesis Restrictions Weight Bearing Restrictions: No   Pain:  Pt denies pain  See Function Navigator for Current Functional Status.   Therapy/Group: Individual Therapy  Leroy Libman 04/05/2017, 2:37 PM

## 2017-04-05 NOTE — Discharge Summary (Signed)
NAMManus [name]:  Cornell, Jaqwan               ACCOUNT NO.:  [id number]  MEDICAL RECORD NO.:  [id number]  LOCATION:  [id number]                        FACILITY:  [facility]  PHYSICIAN:  Ranelle OysterZachary T. Swartz, M.D.DATE OF BIRTH:  1955-12-30  DATE OF ADMISSION:  03/15/2017 DATE OF DISCHARGE:  04/06/2017                              DISCHARGE SUMMARY   DISCHARGE DIAGNOSES: 1. 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 2. SCDs for DVT prophylaxis. 3. 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 4. Pain management. 5. Hypertension. 6. Neurogenic bowel and bladder. 7. Inguinal hernia with scrotal edema.  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  He lives with elderly parents, independent prior to admission.  Working as a Paediatric nursebarber.  Presented on March 07, 2017 with increasing weakness in the lower extremities. MRI unrevealing.  Neurology consulted.  Thoracic MRI, problem with spinal cord infarction.  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  Underwent thrombectomy of left brachial artery, unsuccessful despite multiple attempts by Vascular Surgery as well as placement of lumbar drain.  Hospital course was complicated by hypotension that improved after a bolus of IV fluids. Physical and occupational therapy ongoing.  The patient was admitted for comprehensive rehab program.  PAST MEDICAL HISTORY:  See discharge diagnoses.  SOCIAL HISTORY:  Lives with parents.  Independent prior to admission.  FUNCTIONAL STATUS UPON ADMISSION TO REHAB SERVICES:  +2 physical assist squat-pivot-transfers, moderate assist supine-to-sit, mod-to-max assist activities of daily  living.  PHYSICAL EXAMINATION:  VITAL SIGNS:  Blood pressure 122/70, pulse 84, temperature 98, and respirations 20. GENERAL:  Alert male, in no acute distress.  EOMs intact. NECK:  Supple.  Nontender.  No JVD. CARDIAC:  Rate controlled. ABDOMEN:  Soft, nontender.  Good bowel sounds. LUNGS:  Clear to auscultation without wheeze.  Upper extremities 5/5 bilateral hip flexion, 1 to 1+/5 knee extension, 1 to 1+/5 hip abduction, and 1/5 bilateral ankle dorsi-plantar flexion.  REHABILITATION HOSPITAL COURSE:  The patient was admitted to Inpatient Rehab Services with therapies initiated on a 3-hour daily basis, consisting of physical therapy, occupational therapy, and rehabilitation nursing.  The following issues were addressed during the patient's rehabilitation stay.  Pertaining to [name] thoracic spinal cord infarction, remained stable.  He would follow up with Neurology Services.  Noted status post thrombectomy, left brachial artery, unsuccessful despite multiple attempts by Vascular Surgery.  He would follow up outpatient.  Also noted recent aortic arch replacement, followed by TAVR procedure on January 21, 2017.  SCDs for DVT prophylaxis.  Pain management with the use of MS Contin and monitored. Blood pressures monitored for any orthostatic blood pressure changes on low-dose Lopressor. His Cozaar had been held due to early hospital course orthostasis.  Neurogenic bowel and bladder.  However, he was voiding continently, postvoid residuals were good.  Bowel program established.  Inguinal hernia, large, inhibiting at times.  A scrotal sling had  been ordered. Discussed plan for possible need for outpatient general surgery to evaluate for surgical repair.  The patient received weekly collaborative interdisciplinary team conferences to discuss estimated length of stay, family teaching, any barriers to discharge.  The patient transferred to wheelchair, supervision with sliding board after  placement, propelled wheelchair, supervision to therapy gym, sit-to-stand x5 for lower extremity strengthening and techniques to rolling walker with close supervision.  Ambulating 18 feet, minimal assist rolling walker and left AFO brace.  Activities of daily living and homemaking.  Sessions focused on energy conservation techniques.  Use of equipment techniques and sliding board. Completed sliding board transfers throughout the session with minimal assist.  Overall, required verbal cues and demonstration for proper set up at times.  Full family teaching was completed and plan discharge to home.  DISCHARGE MEDICATIONS: 1. Aspirin 81 mg p.o. daily. 2. Dulcolax suppository daily. 3. Lopressor 12.5 mg p.o. b.i.d. 4. MS Contin 15 mg every 12 hours. 5. Protonix 40 mg p.o. daily. 6. Senokot-S 2 tablets at bedtime. 7. Desyrel 100 mg p.o. at bedtime. 8. Hydrocodone 1 to 2 tablets every 4 hours as needed for breakthrough     pain.  DIET:  Regular.  Special instructions. PRAFO boots while in bed  FOLLOWUP:  The patient would follow up with Dr. Faith RogueZachary Swartz at the outpatient rehab service office as directed; Dr. Lemar LivingsBrandon Cain, Vascular Surgery, call for appointment; Dr. Delia HeadyPramod Sethi, Neurology Services, in 6 weeks; Dr. Billee CashingWayland [name], medical management.     Mariam Dollaraniel Yareth Kearse, P.A.   ______________________________ Ranelle OysterZachary T. Swartz, M.D.    DA/MEDQ  D:  04/05/2017  T:  04/05/2017  Job:  [id number]  cc:   Pramod P. Pearlean BrownieSethi, MD Lorelle FormosaWayland W. [name], M.D. Lemar LivingsBrandon Cain, MD Ranelle OysterZachary T. Swartz, M.D.

## 2017-04-05 NOTE — Progress Notes (Signed)
Asked for a return demonstration of ability to insert suppository for bowel program. Refuses suppository and to demonstrate has the knowledge of ability to carry out bowel program on his own.

## 2017-04-05 NOTE — Discharge Summary (Signed)
Discharge summary job [phone]

## 2017-04-06 ENCOUNTER — Other Ambulatory Visit: Payer: Self-pay

## 2017-04-06 DIAGNOSIS — R29898 Other symptoms and signs involving the musculoskeletal system: Secondary | ICD-10-CM | POA: Diagnosis not present

## 2017-04-06 DIAGNOSIS — G8220 Paraplegia, unspecified: Secondary | ICD-10-CM | POA: Diagnosis not present

## 2017-04-06 DIAGNOSIS — Z9889 Other specified postprocedural states: Secondary | ICD-10-CM | POA: Diagnosis not present

## 2017-04-06 DIAGNOSIS — G9511 Acute infarction of spinal cord (embolic) (nonembolic): Secondary | ICD-10-CM | POA: Diagnosis not present

## 2017-04-06 MED ORDER — HYDROCODONE-ACETAMINOPHEN 5-325 MG PO TABS: 1.0000 | ORAL_TABLET | ORAL | 0 refills | Status: DC | PRN

## 2017-04-06 MED ORDER — MORPHINE SULFATE ER 15 MG PO TBCR: 15.0000 mg | EXTENDED_RELEASE_TABLET | Freq: Two times a day (BID) | ORAL | 0 refills | Status: DC

## 2017-04-06 MED ORDER — SENNOSIDES-DOCUSATE SODIUM 8.6-50 MG PO TABS: 2.0000 | ORAL_TABLET | Freq: Every day | ORAL | Status: DC

## 2017-04-06 MED ORDER — TRAZODONE HCL 100 MG PO TABS: 100.0000 mg | ORAL_TABLET | Freq: Every day | ORAL | 0 refills | Status: DC

## 2017-04-06 MED ORDER — PANTOPRAZOLE SODIUM 40 MG PO TBEC: 40.0000 mg | DELAYED_RELEASE_TABLET | Freq: Every day | ORAL | 0 refills | Status: DC

## 2017-04-06 MED ORDER — BISACODYL 10 MG RE SUPP: 10.0000 mg | Freq: Every day | RECTAL | 0 refills | Status: DC

## 2017-04-06 MED ORDER — METOPROLOL TARTRATE 25 MG PO TABS: 12.5000 mg | ORAL_TABLET | Freq: Two times a day (BID) | ORAL | 1 refills | Status: DC

## 2017-04-06 NOTE — Progress Notes (Signed)
[place] PHYSICAL MEDICINE & REHABILITATION     PROGRESS NOTE    Subjective/Complaints: No new complaints.  Excited to be going home  ROS: pt denies nausea, vomiting, diarrhea, cough, shortness of breath or chest pain   Objective: Vital Signs: Blood pressure 121/63, pulse 86, temperature 98.1 F (36.7 C), temperature source Oral, resp. rate 16, height 5\' 7"  (1.702 m), weight 73 kg (160 lb 15 oz), SpO2 99 %. No results found. No results for input(s): WBC, HGB, HCT, PLT in the last 72 hours. No results for input(s): NA, K, CL, GLUCOSE, BUN, CREATININE, CALCIUM in the last 72 hours.  Invalid input(s): CO CBG (last 3)  No results for input(s): GLUCAP in the last 72 hours.  Wt Readings from Last 3 Encounters:  04/06/17 73 kg (160 lb 15 oz)  03/14/17 76.5 kg (168 lb 10.4 oz)  02/22/17 69.9 kg (154 lb)    Physical Exam:  Constitutional: He appearswell-developedand well-nourished.No distress.  HENT:  Head:Normocephalic.  Eyes:EOMare normal.  Neck:Normal range of motion.Neck supple.No thyromegalypresent.  Cardiovascular: RRR without murmur. No JVD             Respiratory: CTA Bilaterally without wheezes or rales. Normal effort   ZO:XWRUGI:Soft.Bowel sounds are normal. He exhibitsno distension.  Genitourinary: Scrotal/hernia remains unchanged Skin.   Intact  neurological. Cognitively appropriate. No gross nystagmus seen with confrontation. UE 5/5 bilaterally.Bilateral hip flexion is  2- out of 5,  knee extension  1+ to 2- out of 5 hip abduction and abduction are 1+ out of 5. Bilateral ankle dorsiflexion and plantar flexion are 2 to 2+out of 5.   Motor exam essentially unchanged    Psych: cooperative/flat     Assessment/Plan: 1. 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 Physiatrist is providing close team supervision and 24 hour management of active  medical problems listed below. Physiatrist and rehab team continue to assess barriers to discharge/monitor patient progress toward functional and medical goals.  Function:  Bathing Bathing position Bathing activity did not occur: N/A Position: Shower  Bathing parts Body parts bathed by patient: Right arm, Left arm, Chest, Abdomen, Front perineal area, Buttocks, Right upper leg, Left upper leg, Back Body parts bathed by helper: Buttocks, Left lower leg, Right lower leg  Bathing assist Assist Level: Touching or steadying assistance(Pt > 75%)      Upper Body Dressing/Undressing Upper body dressing Upper body dressing/undressing activity did not occur: N/A What is the patient wearing?: Pull over shirt/dress     Pull over shirt/dress - Perfomed by patient: Thread/unthread right sleeve, Thread/unthread left sleeve, Put head through opening, Pull shirt over trunk   Button up shirt - Perfomed by patient: Thread/unthread right sleeve, Thread/unthread left sleeve, Pull shirt around back, Button/unbutton shirt      Upper body assist Assist Level: No help, No cues   Set up : To obtain clothing/put away  Lower Body Dressing/Undressing Lower body dressing Lower body dressing/undressing activity did not occur: N/A What is the patient wearing?: Pants, Underwear, Non-skid slipper socks Underwear - Performed by patient: Pull underwear up/down, Thread/unthread right underwear leg Underwear - Performed by helper: Thread/unthread left underwear leg Pants- Performed by patient: Pull pants up/down, Thread/unthread right pants leg Pants- Performed by helper: Thread/unthread left pants leg Non-skid slipper socks- Performed by patient: Don/doff right sock, Don/doff left sock Non-skid slipper socks- Performed by helper: Don/doff right sock, Don/doff left sock  TED Hose - Performed by helper: Don/doff right TED hose, Don/doff left TED hose  Lower body assist Assist for lower body  dressing: Touching or steadying assistance (Pt > 75%)      Toileting Toileting Toileting activity did not occur: N/A Toileting steps completed by patient: Adjust clothing prior to toileting, Performs perineal hygiene, Adjust clothing after toileting Toileting steps completed by helper: Adjust clothing prior to toileting, Adjust clothing after toileting, Performs perineal hygiene    Toileting assist Assist level: Touching or steadying assistance (Pt.75%)   Transfers Chair/bed transfer   Chair/bed transfer method: Lateral scoot Chair/bed transfer assist level: Supervision or verbal cues Chair/bed transfer assistive device: Armrests     Locomotion Ambulation     Max distance: 12 Assist level: Touching or steadying assistance (Pt > 75%)   Wheelchair Wheelchair activity did not occur: Refused Type: Manual Max wheelchair distance: 150 Assist Level: No help, No cues, assistive device, takes more than reasonable amount of time  Cognition Comprehension Comprehension assist level: Understands complex 90% of the time/cues 10% of the time  Expression Expression assist level: Expresses complex 90% of the time/cues < 10% of the time  Social Interaction Social Interaction assist level: Interacts appropriately 90% of the time - Needs monitoring or encouragement for participation or interaction.  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 Status post thrombectomy of left brachial artery, unsuccessful despite multiple attempts by vascular surgery 03/08/2017 as well as recent aortic arch replacement followed byTEVARprocedure 01/21/2017 Discharge home today    -I would like patient to wear PRAFO's every night while in bed    -Patient to  see Rehab MD in the office for transitional care encounter in 1-2 weeks.  .    - 2. DVT Prophylaxis/Anticoagulation: SCDs.  Dopplers   negative 3. Pain Management:Hydrocodone as needed  -continue MS Contin 15 mg every 12 hours  , no changes 4. Mood:Provide emotional support. Neuro-psych notes reviewed   -continued scheduled trazodone to help with sleep 5. Neuropsych: This patientiscapable of making decisions on hisown behalf. 6. Skin/Wound Care:Routine skin checks 7. Fluids/Electrolytes/Nutrition:I personally reviewed the patient's labs today.   Marland Kitchen.  8.Hypertension.   Lopressor 12.5 mg twice a day.     -contine lopressor, holding Cozaar 25 mg   -Blood pressure controlled 12/10 Vitals:   04/05/17 1957 04/06/17 0408  BP: (!) 112/53 121/63  Pulse: 97 86  Resp:  16  Temp:  98.1 F (36.7 C)  SpO2: 99% 99%   9.Neurogenic bowel and bladder. Appears to be continent. Continue softeners/laxative   -oob to void   -am suppository prn-moves bowels on 12/10    -Added fleets enema if needed     -I reviewed the importance of regular emptying of his bowels and bladder 10. Fever: resolved 12. Mild anemia: hgb has been stable from 10-11. Likely related to chronic disease   -no supplements at present 13. Inguinal hernia:  Large, inhibiting at times.  Scrotal sling ineffective.    -Patient needs outpatient surgical follow-up  LOS (Days) 22 A FACE TO FACE EVALUATION WAS PERFORMED  Faith RogueSWARTZ,Talor Cheema T, MD 04/06/2017 9:00 AM

## 2017-04-06 NOTE — Progress Notes (Signed)
Discharged home with possessions and equipment per wheelchair Discharge instructions reviewed Accompanied by nephew and mother Denies further questions

## 2017-04-06 NOTE — Progress Notes (Addendum)
Social Work  Discharge Note  The overall goal for the admission was met for:   Discharge location: Yes - home with parents/ family able to provide 24/7 assistance  Length of Stay: Yes - 22 days  Discharge activity level: Yes - mod ind to min assist w/c level  Home/community participation: Yes  Services provided included: MD, RD, PT, OT, RN, TR, Pharmacy, Neuropsych and SW  Financial Services: Medicaid and SSD apps are pending  Follow-up services arranged: Home Health: PT, OT via [place], DME: 16x18 lightweight w/c, cushion, rolling walker, 3n1 commode, tub bench and 30" transfer board via [place] and Patient/Family has no preference for HH/DME agencies  Comments (or additional information):  Mother concerned about home entry due to snow/ iced driveway and states she will likely go to motel with pt today until home entry is clear.  Patient/Family verbalized understanding of follow-up arrangements: Yes.  In addition to follow up [facility],  I have also referred pt to [facility] and Wellness program for new primary medical home per pt request.  Individual responsible for coordination of the follow-up plan: pt  Confirmed correct DME delivered: Maela Taked[employer] 04/06/2017    Kasee Hantz

## 2017-04-06 NOTE — Discharge Instructions (Signed)
Inpatient Rehab Discharge Instructions  Victor [name] Discharge date and time: No discharge date for patient encounter.   Activities/Precautions/ Functional Status: Activity: activity as tolerated Diet: regular diet Wound Care: none needed Functional status:  ___ No restrictions     ___ Walk up steps independently ___ 24/7 supervision/assistance   ___ Walk up steps with assistance ___ Intermittent supervision/assistance  ___ Bathe/dress independently ___ Walk with walker     _x__ Bathe/dress with assistance ___ Walk Independently    ___ Shower independently ___ Walk with assistance    ___ Shower with assistance ___ No alcohol     ___ Return to work/school ________   COMMUNITY REFERRALS UPON DISCHARGE:    Home Health:   PT     OT                      Agency:  [facility] Phone: [phone]   Medical Equipment/Items Ordered:  Wheelchair, cushion, rolling walker, 3n1 commode, tub bench                                                      Agency/Supplier:  [facility] @ [phone]      Special Instructions: Continue bowel and bladder program  PRAFO boots while in bed   My questions have been answered and I understand these instructions. I will adhere to these goals and the provided educational materials after my discharge from the hospital.  Patient/Caregiver Signature _______________________________ Date __________  Clinician Signature _______________________________________ Date __________  Please bring this form and your medication list with you to all your follow-up doctor's appointments.

## 2017-04-06 NOTE — Progress Notes (Signed)
Physical Therapy Discharge Summary  Patient Details  Name: Victor [name] MRN: [id number] Date of Birth: 1955/07/08  Today's Date: 04/06/2017  Patient has met 8 of 10 long term goals due to improved activity tolerance, improved balance, improved postural control, increased strength, ability to compensate for deficits and functional use of  right lower extremity and left lower extremity.  Patient to discharge at a wheelchair level Supervision.   Patient's care partner is independent to provide the necessary physical assistance at discharge.  Reasons goals not met: Requires minA for bed mobility, able to ambulate with RW at most 15-18' limited d/t fatigue.   Recommendation:  Patient will benefit from ongoing skilled PT services in home health setting to continue to advance safe functional mobility, address ongoing impairments in strength, coordination, balance, activity tolerance, and minimize fall risk.  Equipment: W/c, transfer board  Reasons for discharge: treatment goals met and discharge from hospital  Patient/family agrees with progress made and goals achieved: Yes  PT Discharge Precautions/Restrictions Precautions Precautions: Fall Precaution Comments: paraparesis Vision/Perception  Perception Perception: Within Functional Limits Praxis Praxis: Intact  Cognition Overall Cognitive Status: Within Functional Limits for tasks assessed Arousal/Alertness: Awake/alert Orientation Level: Oriented X4 Memory: Appears intact Awareness: Appears intact Problem Solving: Appears intact Safety/Judgment: Appears intact Sensation Sensation Light Touch: Appears Intact Stereognosis: Not tested Hot/Cold: Not tested Proprioception: Appears Intact Coordination Gross Motor Movements are Fluid and Coordinated: No Coordination and Movement Description: impaired d/t strength deficits Heel Shin Test: unable d/t strength deficits Motor  Motor Motor - Skilled Clinical Observations:  paraparesis  Mobility Bed Mobility Bed Mobility: Rolling Right;Rolling Left;Supine to Sit;Sit to Supine Rolling Right: 5: Supervision Rolling Left: 5: Supervision Supine to Sit: 4: Min assist Supine to Sit Details: Verbal cues for sequencing;Verbal cues for technique;Verbal cues for precautions/safety;Tactile cues for weight shifting Sit to Supine: 4: Min assist Sit to Supine - Details: Verbal cues for technique;Verbal cues for precautions/safety;Tactile cues for weight shifting;Tactile cues for sequencing;Tactile cues for placement Transfers Transfers: Yes Sit to Stand: 4: Min guard;With upper extremity assist;With armrests Sit to Stand Details: Verbal cues for technique;Verbal cues for precautions/safety Stand Pivot Transfers: 4: Min guard;With armrests(with RW) Squat Pivot Transfers: 5: Supervision;With upper extremity assistance Squat Pivot Transfer Details: Verbal cues for precautions/safety Lateral/Scoot Transfers: 5: Supervision;With slide board Lateral/Scoot Transfer Details: Verbal cues for technique;Verbal cues for precautions/safety;Tactile cues for weight beaing;Tactile cues for weight shifting;Tactile cues for placement;Verbal cues for sequencing;Manual facilitation for placement Locomotion  Ambulation Ambulation: Yes Ambulation/Gait Assistance: 4: Min assist Ambulation Distance (Feet): 18 Feet Assistive device: Rolling walker Ambulation/Gait Assistance Details: Verbal cues for technique;Verbal cues for precautions/safety;Verbal cues for safe use of DME/AE;Verbal cues for gait pattern;Verbal cues for sequencing Gait Gait: Yes Gait Pattern: Impaired Gait Pattern: Poor foot clearance - right;Poor foot clearance - left;Lateral hip instability;Right circumduction;Left circumduction;Right hip hike;Left hip hike Gait velocity: significantly decreased; non-functional speed Stairs / Additional Locomotion Stairs: No Architect: Yes Wheelchair  Assistance: 6: Modified independent (Device/Increase time) Environmental health practitioner: Both upper extremities Wheelchair Parts Management: Independent Distance: 150'  Trunk/Postural Assessment  Cervical Assessment Cervical Assessment: Within Functional Limits Thoracic Assessment Thoracic Assessment: Within Functional Limits Lumbar Assessment Lumbar Assessment: Within Functional Limits  Balance Static Sitting Balance Static Sitting - Balance Support: No upper extremity supported Static Sitting - Level of Assistance: 6: Modified independent (Device/Increase time) Dynamic Sitting Balance Dynamic Sitting - Balance Support: Feet supported;Right upper extremity supported Dynamic Sitting - Level of Assistance: 6: Modified independent (Device/Increase time) Dynamic Sitting -  Balance Activities: Lateral lean/weight shifting;Forward lean/weight shifting Static Standing Balance Static Standing - Level of Assistance: 5: Stand by assistance Static Standing - Comment/# of Minutes: x2-3 min with BUE support Dynamic Standing Balance Dynamic Standing - Balance Support: Bilateral upper extremity supported Dynamic Standing - Level of Assistance: 4: Min assist Dynamic Standing - Comments: during transfers Extremity Assessment  RUE Assessment RUE Assessment: Within Functional Limits LUE Assessment LUE Assessment: Within Functional Limits RLE Assessment RLE Assessment: Exceptions to [facility] RLE Strength RLE Overall Strength: Deficits Right Hip Flexion: 1/5 Right Hip Extension: 3-/5 Right Hip ABduction: 3-/5 Right Hip ADduction: 3-/5 Right Knee Flexion: 2/5 Right Knee Extension: 3-/5 Right Ankle Dorsiflexion: 0/5 Right Ankle Plantar Flexion: 3-/5 LLE Assessment LLE Assessment: Exceptions to WFL LLE Strength LLE Overall Strength: Deficits Left Hip Flexion: 1/5 Left Hip Extension: 3-/5 Left Hip ABduction: 3-/5 Left Hip ADduction: 3-/5 Left Knee Flexion: 3-/5 Left Knee Extension: 2+/5 Left Ankle  Dorsiflexion: 0/5 Left Ankle Plantar Flexion: 3-/5   See Function Navigator for Current Functional Status.  Luberta Mutter 04/06/2017, 3:53 PM

## 2017-04-08 ENCOUNTER — Telehealth: Payer: Self-pay

## 2017-04-08 NOTE — Telephone Encounter (Signed)
[facility] requesting verbal orders for 1xwk X 1wk, and 2xwk X 3wks, called her back and approved her verbal orders..Marland Kitchen

## 2017-04-11 ENCOUNTER — Encounter: Payer: Medicaid Other | Admitting: Physical Medicine & Rehabilitation

## 2017-04-13 ENCOUNTER — Ambulatory Visit: Payer: Medicaid Other | Attending: Internal Medicine | Admitting: Physician Assistant

## 2017-04-13 VITALS — BP 103/62 | HR 91 | Temp 98.0°F

## 2017-04-13 DIAGNOSIS — Z7982 Long term (current) use of aspirin: Secondary | ICD-10-CM | POA: Insufficient documentation

## 2017-04-13 DIAGNOSIS — Z87442 Personal history of urinary calculi: Secondary | ICD-10-CM | POA: Insufficient documentation

## 2017-04-13 DIAGNOSIS — G9511 Acute infarction of spinal cord (embolic) (nonembolic): Secondary | ICD-10-CM | POA: Insufficient documentation

## 2017-04-13 DIAGNOSIS — Z79899 Other long term (current) drug therapy: Secondary | ICD-10-CM | POA: Insufficient documentation

## 2017-04-13 DIAGNOSIS — K592 Neurogenic bowel, not elsewhere classified: Secondary | ICD-10-CM | POA: Diagnosis not present

## 2017-04-13 DIAGNOSIS — G4700 Insomnia, unspecified: Secondary | ICD-10-CM | POA: Insufficient documentation

## 2017-04-13 DIAGNOSIS — G8220 Paraplegia, unspecified: Secondary | ICD-10-CM | POA: Insufficient documentation

## 2017-04-13 DIAGNOSIS — N319 Neuromuscular dysfunction of bladder, unspecified: Secondary | ICD-10-CM | POA: Insufficient documentation

## 2017-04-13 DIAGNOSIS — I71019 Dissection of thoracic aorta, unspecified: Secondary | ICD-10-CM

## 2017-04-13 DIAGNOSIS — K219 Gastro-esophageal reflux disease without esophagitis: Secondary | ICD-10-CM | POA: Diagnosis not present

## 2017-04-13 DIAGNOSIS — I10 Essential (primary) hypertension: Secondary | ICD-10-CM | POA: Insufficient documentation

## 2017-04-13 DIAGNOSIS — I7101 Dissection of thoracic aorta: Secondary | ICD-10-CM

## 2017-04-13 MED ORDER — METOPROLOL TARTRATE 25 MG PO TABS: 12.5000 mg | ORAL_TABLET | Freq: Two times a day (BID) | ORAL | 2 refills | Status: DC

## 2017-04-13 MED ORDER — TRAZODONE HCL 100 MG PO TABS: 100.0000 mg | ORAL_TABLET | Freq: Every day | ORAL | 2 refills | Status: DC

## 2017-04-13 MED ORDER — PANTOPRAZOLE SODIUM 40 MG PO TBEC: 40.0000 mg | DELAYED_RELEASE_TABLET | Freq: Every day | ORAL | 2 refills | Status: DC

## 2017-04-13 NOTE — Progress Notes (Signed)
Patient ID: Victor [name], male   DOB: [birth year], 61 y.o.   MRN: [id number]      Victor [name] Victor [name], is a 61 y.o. male  [id number]  [id number]  DOB - [birth year]  Subjective:  Chief Complaint and HPI: Victor [name] is a 61 y.o. male here today to establish care and for a follow up visit After recent hospitalization, surgery and rehab.  Has f/up scheduled with neurology, cardiology, and rehab medicine over the next several weeks.  He presents today in a wheel chair to establish care.  He still needs to call for his appt with vascular.  He is requesting pain meds for breakthrough pain but should be getting these from rehab medicine.    From rehab discharge summary(dates there 03/15/2017-04/06/2017): DISCHARGE DIAGNOSES: 1. 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 2. SCDs for DVT prophylaxis. 3. 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 4. Pain management. 5. Hypertension. 6. Neurogenic bowel and bladder. 7. Inguinal hernia with scrotal edema.    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  He lives with elderly parents, independent prior to admission.  Working as a Paediatric nursebarber.  Presented on March 07, 2017 with increasing weakness in the lower extremities. MRI unrevealing.  Neurology consulted.  Thoracic MRI, problem with spinal cord infarction.  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  Underwent thrombectomy of left brachial artery, unsuccessful despite multiple attempts by Vascular Surgery as well as placement of lumbar drain.  Hospital course was complicated by hypotension that improved after a bolus of IV fluids. Physical and  occupational therapy ongoing.  The patient was admitted for comprehensive rehab program.   DISCHARGE MEDICATIONS: 1. Aspirin 81 mg p.o. daily. 2. Dulcolax suppository daily. 3. Lopressor 12.5 mg p.o. b.i.d. 4. MS Contin 15 mg every 12 hours. 5. Protonix 40 mg p.o. daily. 6. Senokot-S 2 tablets at bedtime. 7. Desyrel 100 mg p.o. at bedtime. 8. Hydrocodone 1 to 2 tablets every 4 hours as needed for breakthrough     pain.  ED/Hospital notes reviewed.   Social History:  Lives with elderly parents.  Was working as a Paediatric nursebarber   ROS:   Constitutional:  No f/c, No night sweats, No unexplained weight loss. EENT:  No vision changes, No blurry vision, No hearing changes. No mouth, throat, or ear problems.  Respiratory: No cough, No SOB Cardiac: No CP, no palpitations GI:  No abd pain, No N/V/D. GU: No Urinary s/sx Musculoskeletal: +pain Neuro: No headache, no dizziness, no motor weakness.  Skin: No rash Endocrine:  No polydipsia. No polyuria.  Psych: Denies SI/HI  No problems updated.  ALLERGIES: No Known Allergies  PAST MEDICAL HISTORY: Past Medical History:  Diagnosis Date  . History of kidney stones   . Hypertension   . Thoracic aortic aneurysm (HCC) 09/2014    MEDICATIONS AT HOME: Prior to Admission medications   Medication Sig Start Date End Date Taking? Authorizing Provider  aspirin 81 MG EC tablet Take 1 tablet (81 mg total) by mouth daily. 01/28/17  Yes Gold, Wayne E, PA-C  bisacodyl (DULCOLAX) 10 MG suppository Place 1 suppository (10 mg total) rectally daily at 6 (six) AM. 04/06/17  Yes Angiulli, Mcarthur Rossettianiel J,  PA-C  metoprolol tartrate (LOPRESSOR) 25 MG tablet Take 0.5 tablets (12.5 mg total) by mouth 2 (two) times daily. Hold dose if BP<100/60 or pulse<50 your are symptomatic 04/13/17  Yes McClung, Angela M, PA-C  morphine (MS CONTIN) 15 MG 12 hr tablet Take 1 tablet (15 mg total) by mouth every 12 (twelve) hours. 04/06/17  Yes Angiulli, Mcarthur Rossettianiel J, PA-C  pantoprazole  (PROTONIX) 40 MG tablet Take 1 tablet (40 mg total) by mouth daily. 04/13/17  Yes McClung, Angela M, PA-C  senna-docusate (SENOKOT-S) 8.6-50 MG tablet Take 2 tablets by mouth at bedtime. 04/06/17  Yes Angiulli, Mcarthur Rossettianiel J, PA-C  traZODone (DESYREL) 100 MG tablet Take 1 tablet (100 mg total) by mouth at bedtime. 04/13/17  Yes Anders SimmondsMcClung, Angela M, PA-C  HYDROcodone-acetaminophen (NORCO/VICODIN) 5-325 MG tablet Take 1-2 tablets by mouth every 4 (four) hours as needed for moderate pain. Patient not taking: Reported on 04/13/2017 04/06/17   Charlton AmorAngiulli, Daniel J, PA-C     Objective:  EXAM:   Vitals:   04/13/17 1411  BP: 103/62  Pulse: 91  Temp: 98 F (36.7 C)  TempSrc: Oral  SpO2: 100%    General appearance : A&OX3. NAD. Non-toxic-appearing, in a wheel chair, but not wheel chair bound-weak HEENT: Atraumatic and Normocephalic.  PERRLA. EOM intact.  Neck: supple, no JVD. No cervical lymphadenopathy. No thyromegaly Chest/Lungs:  Breathing-non-labored, Good air entry bilaterally, breath sounds normal without rales, rhonchi, or wheezing  CVS: S1 S2 regular, no murmurs, gallops, rubs  Extremities: Bilateral Lower Ext shows no edema, both legs are warm to touch with = pulse throughout Neurology:  CN II-XII grossly intact, Non focal.   Psych:  TP linear. J/I seem limited. Normal speech. Appropriate eye contact and affect.  Skin:  No Rash  Data Review Lab Results  Component Value Date   HGBA1C 5.8 (H) 01/19/2017     Assessment & Plan   1. Insomnia, unspecified type Stable-continue - traZODone (DESYREL) 100 MG tablet; Take 1 tablet (100 mg total) by mouth at bedtime.  Dispense: 30 tablet; Refill: 2  2. Hypertension, unspecified type Drink ~80 ounces water daily Check blood pressure daily and record.  Hold metoprolol if BP is low - metoprolol tartrate (LOPRESSOR) 25 MG tablet; Take 0.5 tablets (12.5 mg total) by mouth 2 (two) times daily. Hold dose if BP<100/60 or pulse<50 your are  symptomatic  Dispense: 30 tablet; Refill: 2 - Comprehensive metabolic panel - CBC with Differential/Platelet  3. Gastroesophageal reflux disease, esophagitis presence not specified - pantoprazole (PROTONIX) 40 MG tablet; Take 1 tablet (40 mg total) by mouth daily.  Dispense: 30 tablet; Refill: 2  4. Dissection of thoracic aorta (HCC) Keep f/up cardiology and schedule appt with Vascular surgeon  5. Spinal cord infarction [facility](HCC) Keep physical rehab appts-pain meds through them Continue with home exercises  Patient have been counseled extensively about nutrition and exercise  Return in about 1 month (around 05/14/2017) for assign PCP; f/up gerd and htn.  The patient was given clear instructions to go to ER or return to medical center if symptoms don't improve, worsen or new problems develop. The patient verbalized understanding. The patient was told to call to get lab results if they haven't heard anything in the next week.     Georgian CoAngela McClung, PA-C [facility] [place], [place] [phone]   04/13/2017, 2:28 PM

## 2017-04-13 NOTE — Patient Instructions (Addendum)
80 ounces water daily  Check blood pressure daily and record.  Hold metoprolol if BP is low

## 2017-04-14 ENCOUNTER — Telehealth: Payer: Self-pay

## 2017-04-14 NOTE — Telephone Encounter (Signed)
Patient called stating is currently out of pain medications,  Missed last appointment scheduled on 04/11/2017 due to him "not being aware of appointment"  His hospital follow up appointment is now scheduled for 05/18/2017.  States is in large amounts of pain and requesting refills on pain medications.  Medications that were prescribed to him was hydrocodone-apap 5-325 and morphine 15mg   Please advise

## 2017-04-14 NOTE — Telephone Encounter (Signed)
He will need to be seen for appointments.  Please see if Victor [name] has a spot available to work him in

## 2017-04-15 ENCOUNTER — Encounter: Payer: PRIVATE HEALTH INSURANCE | Attending: Registered Nurse | Admitting: Registered Nurse

## 2017-04-26 DIAGNOSIS — G8220 Paraplegia, unspecified: Secondary | ICD-10-CM | POA: Diagnosis not present

## 2017-04-26 DIAGNOSIS — G9511 Acute infarction of spinal cord (embolic) (nonembolic): Secondary | ICD-10-CM | POA: Diagnosis not present

## 2017-04-26 DIAGNOSIS — Z9889 Other specified postprocedural states: Secondary | ICD-10-CM | POA: Diagnosis not present

## 2017-04-26 DIAGNOSIS — R29898 Other symptoms and signs involving the musculoskeletal system: Secondary | ICD-10-CM | POA: Diagnosis not present

## 2017-04-29 ENCOUNTER — Telehealth: Payer: Self-pay

## 2017-04-29 NOTE — Telephone Encounter (Signed)
That would be fine 

## 2017-04-29 NOTE — Telephone Encounter (Signed)
OT 2wk3   1wk3 approval  Given.

## 2017-04-29 NOTE — Telephone Encounter (Signed)
Victor CorinSusan Davis called requesting verbal orders to continue with patient.

## 2017-05-04 DIAGNOSIS — N319 Neuromuscular dysfunction of bladder, unspecified: Secondary | ICD-10-CM

## 2017-05-04 DIAGNOSIS — G8220 Paraplegia, unspecified: Secondary | ICD-10-CM

## 2017-05-04 DIAGNOSIS — G9511 Acute infarction of spinal cord (embolic) (nonembolic): Secondary | ICD-10-CM

## 2017-05-04 DIAGNOSIS — K592 Neurogenic bowel, not elsewhere classified: Secondary | ICD-10-CM

## 2017-05-04 DIAGNOSIS — I10 Essential (primary) hypertension: Secondary | ICD-10-CM

## 2017-05-04 DIAGNOSIS — Z7982 Long term (current) use of aspirin: Secondary | ICD-10-CM

## 2017-05-10 DIAGNOSIS — Z736 Limitation of activities due to disability: Secondary | ICD-10-CM

## 2017-05-13 ENCOUNTER — Telehealth: Payer: Self-pay

## 2017-05-13 NOTE — Telephone Encounter (Signed)
Victor MarkerFrankie [name] [facility] is requesting verbal orders for 2 times a week for 3 weeks.

## 2017-05-13 NOTE — Telephone Encounter (Signed)
I left a message on Suanne MarkerFrankie Lukas confidential voice mail giving verbal orders per office protocol.

## 2017-05-18 ENCOUNTER — Other Ambulatory Visit: Payer: Self-pay

## 2017-05-18 ENCOUNTER — Encounter: Payer: Self-pay | Admitting: Physical Medicine & Rehabilitation

## 2017-05-18 ENCOUNTER — Encounter: Payer: Medicaid Other | Attending: Registered Nurse | Admitting: Physical Medicine & Rehabilitation

## 2017-05-18 VITALS — BP 114/70 | HR 97

## 2017-05-18 DIAGNOSIS — G9511 Acute infarction of spinal cord (embolic) (nonembolic): Secondary | ICD-10-CM | POA: Insufficient documentation

## 2017-05-18 DIAGNOSIS — G8220 Paraplegia, unspecified: Secondary | ICD-10-CM | POA: Diagnosis present

## 2017-05-18 DIAGNOSIS — M792 Neuralgia and neuritis, unspecified: Secondary | ICD-10-CM | POA: Diagnosis present

## 2017-05-18 MED ORDER — HYDROCODONE-ACETAMINOPHEN 5-325 MG PO TABS: 1.0000 | ORAL_TABLET | Freq: Three times a day (TID) | ORAL | 0 refills | Status: DC | PRN

## 2017-05-18 MED ORDER — GABAPENTIN 100 MG PO CAPS: 100.0000 mg | ORAL_CAPSULE | Freq: Three times a day (TID) | ORAL | 3 refills | Status: DC

## 2017-05-18 NOTE — Progress Notes (Signed)
Subjective:    Patient ID: Victor [name], male    DOB: [birth year], 62 y.o.   MRN: [id number]  HPI   Victor [name] is here in follow up of his thoracic spinal cord injury. He left inpatient rehab in December. He is walking in the house with therapy and supervision. HH PT is still seeing him.    He has had ongoing burning and aching pain in his legs, sometimes his back. Pain is most severe on his thighs. His primary had written him some hydrocodone. He is long since out of the medication. He is no longer on MS contin either.   He complains of constipation. He is having 1-2 weeks before bowel movements. He is using enema to help with evacuation. He has purchased an over the counter stool softener which he ran out of.   He is emptying his bladder fairly well. He sometimes has some dribble after he's done emptying. He's urinating 6-8 x per times per day.   He hasn't seen anyone in follow up of inguinal hernia. The hernia can be uncomfortable and an obstacle at times    Pain Inventory Average Pain 7 Pain Right Now 8 My pain is constant, burning and aching  In the last 24 hours, has pain interfered with the following? General activity 7 Relation with others 7 Enjoyment of life 7 What TIME of day is your pain at its worst? night Sleep (in general) Poor  Pain is worse with: sitting and inactivity Pain improves with: medication Relief from Meds: 10  Mobility use a cane ability to climb steps?  yes do you drive?  no use a wheelchair  Function disabled: date disabled . I need assistance with the following:  dressing, bathing, meal prep, household duties and shopping  Neuro/Psych trouble walking spasms depression  Prior Studies Any changes since last visit?  no  Physicians involved in your care Any changes since last visit?  no   Family History  Problem Relation Age of Onset  . Heart murmur Mother    Social History   Socioeconomic History  . Marital status: Single      Spouse name: None  . Number of children: None  . Years of education: None  . Highest education level: None  Social Needs  . Financial resource strain: None  . Food insecurity - worry: None  . Food insecurity - inability: None  . Transportation needs - medical: None  . Transportation needs - non-medical: None  Occupational History  . None  Tobacco Use  . Smoking status: Former Smoker    Years: 35.00    Types: Cigarettes, Cigars    Last attempt to quit: 10/07/2014    Years since quitting: 2.6  . Smokeless tobacco: Never Used  Substance and Sexual Activity  . Alcohol use: No    Alcohol/week: 0.0 oz  . Drug use: No  . Sexual activity: None  Other Topics Concern  . None  Social History Narrative  . None   Past Surgical History:  Procedure Laterality Date  . ASCENDING AORTIC ROOT REPLACEMENT N/A 01/21/2017   Procedure: Replacement of Aortic Arch with circulatory arrest;  Surgeon: Delight OvensGerhardt, Edward B, MD;  [facility];  Service: Open Heart Surgery;  Laterality: N/A;  . CARDIAC CATHETERIZATION    . FEMORAL-FEMORAL BYPASS GRAFT N/A 10/07/2014   Procedure: LEFT  FEMORAL ARTERY -RIGHT FEMORAL ARTERY BYPASS GRAFT USING 8MM X 30 CM HEMASHIELD GOLD GRAFT;  Surgeon: Larina Earthlyodd F Early, MD;  [facility];  Service: Vascular;  Laterality: N/A;  . RIGHT/LEFT HEART CATH AND CORONARY ANGIOGRAPHY N/A 01/11/2017   Procedure: RIGHT/LEFT HEART CATH AND CORONARY ANGIOGRAPHY- Diagnostic Only;  Surgeon: [name], MD;  Location: [facility] INVASIVE CV LAB;  Service: Cardiovascular;  Laterality: N/A;  . s/p thoracic aneurysm repair    . STERNOTOMY N/A 01/21/2017   Procedure: REDO STERNOTOMY;  Surgeon: [name], MD;  Location: [facility] OR;  Service: Open Heart Surgery;  Laterality: N/A;  . TEE WITHOUT CARDIOVERSION N/A 01/21/2017   Procedure: TRANSESOPHAGEAL ECHOCARDIOGRAM (TEE);  Surgeon: [name], MD;  Location: [facility] OR;  Service: Open Heart Surgery;  Laterality: N/A;  . THORACIC AORTIC  ANEURYSM REPAIR N/A 10/06/2014   Procedure: THORACIC ASCENDING ANEURYSM REPAIR (AAA);  Surgeon: [name], MD;  Location: [facility] OR;  Service: Open Heart Surgery;  Laterality: N/A;  hyportermia circulatory arrest and resuspension of aortic valve  . THORACIC AORTIC ENDOVASCULAR STENT GRAFT N/A 01/21/2017   Procedure: THORACIC AORTIC ENDOVASCULAR STENT GRAFT;  Surgeon: Nada Libman, MD;  Location: [facility] OR;  Service: Vascular;  Laterality: N/A;  . THROMBECTOMY ILIAC ARTERY Left 03/08/2017   Procedure: THROMBECTOMY of Left subclavian artery;  Surgeon: Chuck Hint, MD;  [facility] OR;  Service: Vascular;  Laterality: Left;  Marland Kitchen VASCULAR SURGERY     Past Medical History:  Diagnosis Date  . History of kidney stones   . Hypertension   . Thoracic aortic aneurysm (HCC) 09/2014   There were no vitals taken for this visit.  Opioid Risk Score:   Fall Risk Score:  `1  Depression screen PHQ 2/9  Depression screen PHQ 2/9 05/18/2017  Decreased Interest 0  Down, Depressed, Hopeless 0  PHQ - 2 Score 0     Review of Systems  Constitutional: Negative.   HENT: Negative.   Eyes: Negative.   Respiratory: Negative.   Cardiovascular: Negative.   Gastrointestinal: Positive for constipation.  Endocrine: Negative.   Genitourinary: Negative.   Musculoskeletal: Negative.   Skin: Negative.   Allergic/Immunologic: Negative.   Neurological: Negative.   Hematological: Negative.   Psychiatric/Behavioral: Negative.        Objective:   Physical Exam  Constitutional: He appearswell-developedand well-nourished.No distress.  HENT:  Head:Normocephalic.  Eyes:EOMare normal.  Neck:Normal range of motion.Neck supple.No thyromegalypresent.  Cardiovascular: RRR          Respiratory: CTA Bilaterally without wheezes or rales. Normal effort  GN:FAOZ.Bowel sounds are normal. He exhibitsno distension.  Genitourinary: Scrotal/hernia  present Skin.   Intact  neurological. Cognitively  appropriate. No gross nystagmus seen with confrontation. UE 5/5 bilaterally.Right hip flexion is  3 out of 5,  knee extension 3 out of 5. Right ankle dorsiflexion and plantar flexion are 3 out of 5.   Left hf, ke are 2/5, ADF/PF 2-, sensory decreased to LT/pain below level of injury    Psych: pleasant, activated and more engaged        Assessment & Plan:  Medical Problem List and Plan: 1.Paraplegiasecondary to thoracic spinal cord infarction. Status post thrombectomy of left brachial artery, unsuccessful despite multiple attempts by vascular surgery 111/06/2016 as well as recent aortic arch replacement followed byTEVARprocedure 01/21/2017 -progress to outpt neuro-rehab for mobility, ?afo LLE likely will benefit him             - 2.   Pain Management:Hydrocodone as needed--rxed #45 of 5/325 today   -CSA signed          -initiated gabapentin 100 tid for nerve  pain 4. Mood:follow for depression. No treatment right now although he has his moments. Asked patient to call me if mood worsens in any way. He seems to be pretty up beat at present 5 Neurogenic bowel and bladder.    -begin miralax daily to bid   -daily to every other day suppository or enema, try dig stim first.    -double voids, uro consult at some point 6.  Inguinal hernia:  Large, inhibiting at times.  Scrotal sling ineffective.                         -Patient needs outpatient surgical follow-up    Follow up with me in 6 weeks. Thirty minutes of face to face patient care time were spent during this visit. All questions were encouraged and answered.

## 2017-05-18 NOTE — Patient Instructions (Signed)
For your bladder you need to make sure you are emptying as completely as possible.  If you urinate you need to take a moment to do some massage to your lower abdomen and try to empty again.  Do this especially if you empty in the bed walked into the urinal before you get to the toilet.   For your bowels I recommend taking MiraLAX once a day to start and if this is ineffective going to twice a day.  Need to use digital stem with a glove and/or suppository likely every day to help stimulate her bowels to empty.  You could use her enema as a rescue technique if needed.  Need to increase her fluids as well as vegetable and fruit intake which will help with her bowels be softer too.

## 2017-05-25 ENCOUNTER — Encounter: Payer: Self-pay | Admitting: Physician Assistant

## 2017-05-25 ENCOUNTER — Ambulatory Visit: Payer: Self-pay | Admitting: Internal Medicine

## 2017-05-25 ENCOUNTER — Ambulatory Visit (INDEPENDENT_AMBULATORY_CARE_PROVIDER_SITE_OTHER): Payer: Self-pay | Admitting: Physician Assistant

## 2017-05-25 VITALS — BP 118/60 | HR 89 | Ht 67.0 in | Wt 150.0 lb

## 2017-05-25 DIAGNOSIS — Z9889 Other specified postprocedural states: Secondary | ICD-10-CM

## 2017-05-25 DIAGNOSIS — I491 Atrial premature depolarization: Secondary | ICD-10-CM

## 2017-05-25 DIAGNOSIS — Z8679 Personal history of other diseases of the circulatory system: Secondary | ICD-10-CM

## 2017-05-25 DIAGNOSIS — G9511 Acute infarction of spinal cord (embolic) (nonembolic): Secondary | ICD-10-CM

## 2017-05-25 NOTE — Patient Instructions (Signed)
Medication Instructions:  Your physician recommends that you continue on your current medications as directed. Please refer to the Current Medication list given to you today.  Labwork: None   Testing/Procedures: None   Follow-Up: Your physician recommends that you schedule a follow-up appointment in: 2 MONTH FOLLOW UP with DR HILTY ONLY  Any Other Special Instructions Will Be Listed Below (If Applicable). If you need a refill on your cardiac medications before your next appointment, please call your pharmacy. `

## 2017-05-25 NOTE — Progress Notes (Signed)
Cardiology Office Note    Date:  05/26/2017   ID:  Victor [name], DOB 1955-04-29, MRN [id number]  PCP:  Ranelle Oyster, MD  Cardiologist:  Dr. Rennis Golden   Chief Complaint  Patient presents with  . Follow-up    3 months    History of Present Illness:  Victor [name] is a 62 y.o. male with PMH of tobacco abuse, polysubstance abuse and h/o thoracic aortic dissection s/p repair. He has no family history of CAD. He was admitted in June 2016 and underwent repair of ascending aortic dissection with replacement of ascending aorta and suspension of the aortic valve with cardiopulmonary bypass. TEE during the surgery revealed ejection fraction of 60-65%. He also required femorofemoral bypass due to ischemic right leg in the early postoperative period. He was seen in office on 09/23/2015, at which time his metoprolol has already been increased to 25 mg twice a day by Dr. Tyrone Sage, his blood pressure was well-controlled and he was doing well from cardiology perspective. Otherwise his only complaint was his large inguinal hernia which he has followed up with general surgery. 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 He underwent stress test on 12/15/2016 for preoperative clearance. Stress test showed EF 55%, no ischemia or infarction, low risk study. Echocardiogram obtained on 12/23/2016 showed EF 60-65%, mild LVH, grade 2 DD.  Cardiac catheterization performed on 01/11/2017 showed angiographically normal coronary arteries, mild narrowing at the ostium of left main, normal right heart pressure. Due to the enlarging distal aortic arch, he eventually underwent aortic reconstruction, TEVAR,  by Dr. Myra Gianotti and Dr. Tyrone Sage. He underwent standard diuresis for volume overload on postoperative  day one. He was treated with Elita Quick for presumed pneumonia.  Patient returned to the hospital on 03/07/2017 with lower extremity weakness after fall.  He also complained of worsening lower back pain as well.  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  Initial symptom was concerning for spinal cord ischemia.  Lumbar drainage was performed without significant clinical improvement.  She underwent a thrombectomy of left brachial artery by Dr. Edilia Bo on 03/08/2017, however this was unsuccessful suggesting subclavian artery stenosis may be chronic.  MRI of the spine obtained on 03/10/2017 showed abnormal cord signal intensity within the distal spinal cord to the level of conus, this likely reflect evolving spinal cord infarct. This results in paraplegia.   Patient presents today for cardiology office visit.  He is in wheelchair, he does ambulate with a walker.  However his functional ability remain very limited due to the recent spinal cord infarct.  It is unclear to me what triggered the spinal cord infarct, MRI of the sprain did not mention any significant hematoma.  Otherwise from cardiac perspective he has been doing well, denying any significant chest pain or shortness of breath.  Sometimes his blood pressure does drop into the 90s, he has been instructed to hold his metoprolol if his systolic blood pressure does drop below 100.    Past Medical History:  Diagnosis Date  . History of kidney stones   . Hypertension   . Thoracic aortic aneurysm (HCC) 09/2014    Past Surgical History:  Procedure Laterality Date  . ASCENDING AORTIC ROOT REPLACEMENT N/A 01/21/2017   Procedure: Replacement of Aortic  Arch with circulatory arrest;  Surgeon: Delight OvensGerhardt, Edward B, MD;  [facility];  Service: Open Heart Surgery;  Laterality: N/A;  . CARDIAC CATHETERIZATION    . FEMORAL-FEMORAL BYPASS GRAFT N/A 10/07/2014   Procedure: LEFT  FEMORAL ARTERY -RIGHT FEMORAL  ARTERY BYPASS GRAFT USING 8MM X 30 CM HEMASHIELD GOLD GRAFT;  Surgeon: Larina Earthlyodd F Early, MD;  [facility];  Service: Vascular;  Laterality: N/A;  . RIGHT/LEFT HEART CATH AND CORONARY ANGIOGRAPHY N/A 01/11/2017   Procedure: RIGHT/LEFT HEART CATH AND CORONARY ANGIOGRAPHY- Diagnostic Only;  Surgeon: Tonny Bollmanooper, Michael, MD;  [facility];  Service: Cardiovascular;  Laterality: N/A;  . s/p thoracic aneurysm repair    . STERNOTOMY N/A 01/21/2017   Procedure: REDO STERNOTOMY;  Surgeon: Delight OvensGerhardt, Edward B, MD;  [facility];  Service: Open Heart Surgery;  Laterality: N/A;  . TEE WITHOUT CARDIOVERSION N/A 01/21/2017   Procedure: TRANSESOPHAGEAL ECHOCARDIOGRAM (TEE);  Surgeon: Delight OvensGerhardt, Edward B, MD;  [facility];  Service: Open Heart Surgery;  Laterality: N/A;  . THORACIC AORTIC ANEURYSM REPAIR N/A 10/06/2014   Procedure: THORACIC ASCENDING ANEURYSM REPAIR (AAA);  Surgeon: Delight OvensEdward B Gerhardt, MD;  [facility];  Service: Open Heart Surgery;  Laterality: N/A;  hyportermia circulatory arrest and resuspension of aortic valve  . THORACIC AORTIC ENDOVASCULAR STENT GRAFT N/A 01/21/2017   Procedure: THORACIC AORTIC ENDOVASCULAR STENT GRAFT;  Surgeon: Nada LibmanBrabham, Vance W, MD;  [facility];  Service: Vascular;  Laterality: N/A;  . THROMBECTOMY ILIAC ARTERY Left 03/08/2017   Procedure: THROMBECTOMY of Left subclavian artery;  Surgeon: Chuck Hintickson, Christopher S, MD;  [facility];  Service: Vascular;  Laterality: Left;  Marland Kitchen. VASCULAR SURGERY      Current Medications: Outpatient Medications Prior to Visit  Medication Sig Dispense Refill  . aspirin 81 MG EC tablet Take 1 tablet (81 mg total) by mouth daily.    . bisacodyl (DULCOLAX) 10 MG suppository Place 1 suppository (10 mg total) rectally daily at 6 (six) AM. 12 suppository 0  . gabapentin (NEURONTIN) 100 MG capsule Take 1 capsule (100 mg total) by mouth 3 (three) times daily. 90 capsule 3  . HYDROcodone-acetaminophen (NORCO/VICODIN) 5-325 MG  tablet Take 1 tablet by mouth every 8 (eight) hours as needed for moderate pain. 45 tablet 0  . metoprolol tartrate (LOPRESSOR) 25 MG tablet Take 0.5 tablets (12.5 mg total) by mouth 2 (two) times daily. Hold dose if BP<100/60 or pulse<50 your are symptomatic 30 tablet 2  . morphine (MS CONTIN) 15 MG 12 hr tablet Take 1 tablet (15 mg total) by mouth every 12 (twelve) hours. 28 tablet 0  . pantoprazole (PROTONIX) 40 MG tablet Take 1 tablet (40 mg total) by mouth daily. 30 tablet 2  . senna-docusate (SENOKOT-S) 8.6-50 MG tablet Take 2 tablets by mouth at bedtime.    . traZODone (DESYREL) 100 MG tablet Take 1 tablet (100 mg total) by mouth at bedtime. 30 tablet 2   No facility-administered medications prior to visit.      Allergies:   Patient has no known allergies.   Social History   Socioeconomic History  . Marital status: Single    Spouse name: None  . Number of children: None  . Years of education: None  . Highest education level: None  Social Needs  . Financial resource strain: None  . Food insecurity - worry: None  . Food insecurity - inability: None  . Transportation needs - medical: None  . Transportation needs - non-medical: None  Occupational History  . None  Tobacco Use  . Smoking status: Former Smoker    Years: 35.00    Types: Cigarettes, Cigars    Last attempt to quit: 10/07/2014    Years since quitting: 2.6  . Smokeless tobacco: Never Used  Substance and Sexual Activity  . Alcohol use: No    Alcohol/week: 0.0 oz  . Drug use: No  . Sexual activity: None  Other Topics Concern  . None  Social History Narrative  . None     Family History:  The patient's family history includes Heart murmur in his mother.   ROS:   Please see the history of present illness.    ROS All other systems reviewed and are negative.   PHYSICAL EXAM:   VS:  BP 118/60   Pulse 89   Ht 5\' 7"  (1.702 m)   Wt 150 lb (68 kg)   BMI 23.49 kg/m    GEN: Well nourished, well developed, in  no acute distress  HEENT: normal  Neck: no JVD, carotid bruits, or masses Cardiac: RRR; no murmurs, rubs, or gallops,no edema  Respiratory:  clear to auscultation bilaterally, normal work of breathing GI: soft, nontender, nondistended, + BS MS: no deformity or atrophy  Skin: warm and dry, no rash Neuro:  Alert and Oriented x 3, Strength and sensation are intact Psych: euthymic mood, full affect  Wt Readings from Last 3 Encounters:  05/25/17 150 lb (68 kg)  04/06/17 160 lb 15 oz (73 kg)  03/14/17 168 lb 10.4 oz (76.5 kg)      Studies/Labs Reviewed:   EKG:  EKG is not ordered today.    Recent Labs: 01/25/2017: Magnesium 2.0; TSH 1.456 03/16/2017: ALT 10 04/01/2017: BUN 13; Creatinine, Ser 1.11; Hemoglobin 10.1; Platelets 346; Potassium 4.7; Sodium 137   Lipid Panel    Component Value Date/Time   CHOL 121 12/23/2016 0813   TRIG 57 12/23/2016 0813   HDL 43 12/23/2016 0813   CHOLHDL 2.8 12/23/2016 0813   CHOLHDL 2.5 02/03/2015 0936   VLDL 12 02/03/2015 0936   LDLCALC 67 12/23/2016 0813    Additional studies/ records that were reviewed today include:   MRI of spine 03/10/2017 MRI LUMBAR SPINE IMPRESSION:  1. Abnormal cord signal intensity within the visualized distal spinal cord to the level of the conus, increased in constant acuity relative to previous thoracic spine MRI, most likely reflecting evolving spinal cord infarct. 2. Mild paraspinous edema as above, most likely related to recent lumbar drain placement. 3. Multilevel degenerative changes    ASSESSMENT:    1. S/P aortic dissection repair   2. Spinal cord infarction (HCC)   3. S/P thoracic aortic aneurysm repair   4. PAC (premature atrial contraction)      PLAN:  In order of problems listed above:  1. Spinal cord infarct: Will need to avoid significant hypotension.  Unclear to me why he had spinal cord infarct, MRI of the spine did not mention significant hematoma around the area.  He has regained  some function of the leg, however continue to have significant weakness.  He does ambulate with walker, functional ability is fairly limited in this case.  2. Aortic dissection repair: previous surgery in 2016  3. Thoracic aortic aneurysm repair: TEVAR in 2018.  Followed by vascular surgery and CT surgery.  4. PAC: History of irregular heartbeat, however no prior diagnosis of atrial fibrillation.  Low-dose metoprolol for suppression of PACs.  Unable to do uptitrated due to soft  blood pressure.    Medication Adjustments/Labs and Tests Ordered: Current medicines are reviewed at length with the patient today.  Concerns regarding medicines are outlined above.  Medication changes, Labs and Tests ordered today are listed in the Patient Instructions below. Patient Instructions  Medication Instructions:  Your physician recommends that you continue on your current medications as directed. Please refer to the Current Medication list given to you today.  Labwork: None   Testing/Procedures: None   Follow-Up: Your physician recommends that you schedule a follow-up appointment in: 2 MONTH FOLLOW UP with DR HILTY ONLY  Any Other Special Instructions Will Be Listed Below (If Applicable). If you need a refill on your cardiac medications before your next appointment, please call your pharmacy. [address]    Ramond Dial, [place]  05/26/2017 11:31 PM    [facility] [address] [place], [place], [place]  [address] Phone: [phone]; Fax: [phone]

## 2017-05-26 ENCOUNTER — Encounter: Payer: Self-pay | Admitting: Physician Assistant

## 2017-05-27 DIAGNOSIS — G8220 Paraplegia, unspecified: Secondary | ICD-10-CM | POA: Diagnosis not present

## 2017-05-27 DIAGNOSIS — Z9889 Other specified postprocedural states: Secondary | ICD-10-CM | POA: Diagnosis not present

## 2017-05-27 DIAGNOSIS — G9511 Acute infarction of spinal cord (embolic) (nonembolic): Secondary | ICD-10-CM | POA: Diagnosis not present

## 2017-05-27 DIAGNOSIS — R29898 Other symptoms and signs involving the musculoskeletal system: Secondary | ICD-10-CM | POA: Diagnosis not present

## 2017-06-01 ENCOUNTER — Ambulatory Visit: Payer: PRIVATE HEALTH INSURANCE | Admitting: Physical Therapy

## 2017-06-02 ENCOUNTER — Encounter: Payer: Self-pay | Admitting: Rehabilitation

## 2017-06-02 ENCOUNTER — Other Ambulatory Visit: Payer: Self-pay

## 2017-06-02 ENCOUNTER — Ambulatory Visit: Payer: Medicaid Other | Attending: Physical Medicine & Rehabilitation | Admitting: Rehabilitation

## 2017-06-02 DIAGNOSIS — R2689 Other abnormalities of gait and mobility: Secondary | ICD-10-CM | POA: Diagnosis present

## 2017-06-02 DIAGNOSIS — R293 Abnormal posture: Secondary | ICD-10-CM | POA: Diagnosis present

## 2017-06-02 DIAGNOSIS — G8222 Paraplegia, incomplete: Secondary | ICD-10-CM | POA: Insufficient documentation

## 2017-06-02 DIAGNOSIS — R2681 Unsteadiness on feet: Secondary | ICD-10-CM | POA: Diagnosis present

## 2017-06-02 DIAGNOSIS — M6281 Muscle weakness (generalized): Secondary | ICD-10-CM | POA: Diagnosis present

## 2017-06-02 NOTE — Therapy (Signed)
[facility] [address] [place], [place], [address] Phone: [phone]   Fax:  [phone]  Physical Therapy Evaluation  Patient Details  Name: Victor [name] MRN: [id number] Date of Birth: April 11, 1956 Referring Provider: Faith Rogue, MD   Encounter Date: 06/02/2017  PT End of Session - 06/02/17 1946    Visit Number  1    Number of Visits  21    Date for PT Re-Evaluation  08/01/17    Authorization Type  Self pay?    PT Start Time  1317    PT Stop Time  1402    PT Time Calculation (min)  45 min    Equipment Utilized During Treatment  Gait belt    Activity Tolerance  Patient tolerated treatment well;Patient limited by fatigue    Behavior During Therapy  WFL for tasks assessed/performed       Past Medical History:  Diagnosis Date  . History of kidney stones   . Hypertension   . 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 Thoracic aortic aneurysm (HCC) 09/2014    Past Surgical History:  Procedure Laterality Date  . ASCENDING AORTIC ROOT REPLACEMENT N/A 01/21/2017   Procedure: Replacement of Aortic Arch with circulatory arrest;  Surgeon: [name], MD;  Location: [facility] OR;  Service: Open Heart Surgery;  Laterality: N/A;  . CARDIAC CATHETERIZATION    . FEMORAL-FEMORAL BYPASS GRAFT N/A 10/07/2014   Procedure: LEFT  FEMORAL ARTERY -RIGHT FEMORAL ARTERY BYPASS GRAFT USING X 30 CM HEMASHIELD GOLD GRAFT;  Surgeon: Larina Earthly, MD;  Location: [facility] OR;  Service: Vascular;  Laterality: N/A;  . RIGHT/LEFT HEART CATH AND CORONARY ANGIOGRAPHY N/A 01/11/2017   Procedure: RIGHT/LEFT HEART CATH AND CORONARY ANGIOGRAPHY- Diagnostic Only;  Surgeon: [name], MD;  Location: [facility] INVASIVE CV LAB;  Service: Cardiovascular;  Laterality: N/A;  . s/p thoracic aneurysm repair    . STERNOTOMY N/A 01/21/2017   Procedure: REDO STERNOTOMY;  Surgeon: [name], MD;  Location: [facility] OR;  Service: Open Heart Surgery;  Laterality:  N/A;  . TEE WITHOUT CARDIOVERSION N/A 01/21/2017   Procedure: TRANSESOPHAGEAL ECHOCARDIOGRAM (TEE);  Surgeon: [name], MD;  Location: [facility] OR;  Service: Open Heart Surgery;  Laterality: N/A;  . THORACIC AORTIC ANEURYSM REPAIR N/A 10/06/2014   Procedure: THORACIC ASCENDING ANEURYSM REPAIR (AAA);  Surgeon: [name], MD;  Location: [facility] OR;  Service: Open Heart Surgery;  Laterality: N/A;  hyportermia circulatory arrest and resuspension of aortic valve  . THORACIC AORTIC ENDOVASCULAR STENT GRAFT N/A 01/21/2017   Procedure: THORACIC AORTIC ENDOVASCULAR STENT GRAFT;  Surgeon: Nada Libman, MD;  Location: [facility] OR;  Service: Vascular;  Laterality: N/A;  . THROMBECTOMY ILIAC ARTERY Left 03/08/2017   Procedure: THROMBECTOMY of Left subclavian artery;  Surgeon: Chuck Hint, MD;  Location: [facility] OR;  Service: Vascular;  Laterality: Left;  Marland Kitchen VASCULAR SURGERY      There were no vitals filed for this visit.   Subjective Assessment - 06/02/17 1322    Subjective  "I had a stroke in November.  I'm wanting to work on the legs.  The left leg seems weaker."      Pertinent History  Hx of aortic arch replacement and bypass to CCA and L subclavian a. 01/21/17, unsuccessful thrombectomy L bracial a on 03/08/17    Limitations  House hold activities;Walking    How long can you walk comfortably?  approx 30-50' with RW    Patient Stated Goals  "  I want to be able to walk"     Currently in Pain?  No/denies         [facility] PT Assessment - 06/02/17 0001      Assessment   Medical Diagnosis  thoracic spinal cord CVA    Referring Provider  Faith Rogue, MD    Onset Date/Surgical Date  03/07/17    Prior Therapy  IP rehab and HHPT      Precautions   Precautions  Fall      Balance Screen   Has the patient fallen in the past 6 months  Yes    How many times?  1    Has the patient had a decrease in activity level because of a fear of falling?   Yes    Is the patient reluctant to leave their home  because of a fear of falling?   Yes      Home Environment   Living Environment  Private residence    Living Arrangements  Parent mom is taking care of him    Available Help at Discharge  Family;Available 24 hours/day    Type of Home  House    Home Access  Stairs to enter    Entrance Stairs-Number of Steps  3    Home Layout  Two level;Bed/bath upstairs    Alternate Level Stairs-Number of Steps  6    Alternate Level Stairs-Rails  Left    Home Equipment  Walker - 2 wheels;Wheelchair - manual;Tub bench;Bedside commode      Prior Function   Level of Independence  Independent    Vocation  Full time employment    Vocation Requirements  [employer] video games, playing sweepstakes games at the store.       Sensation   Light Touch  Appears Intact    Hot/Cold  Appears Intact    Proprioception  Appears Intact      Coordination   Gross Motor Movements are Fluid and Coordinated  No    Fine Motor Movements are Fluid and Coordinated  No    Heel Shin Test  unable to do due to decreased strength      ROM / Strength   AROM / PROM / Strength  Strength      Strength   Overall Strength  Deficits    Overall Strength Comments  seated: R hip flex 2/5, L hip flex 1/5; R knee ext 3+/5, L knee ext 2/5, R knee flex 2+/5, L  knee flex 1/5; R ankle DF 2/5, L ankle DF 2/5       Transfers   Transfers  Sit to Stand;Stand to Sit    Sit to Stand  5: Supervision    Sit to Stand Details  Verbal cues for sequencing;Verbal cues for technique;Verbal cues for precautions/safety    Sit to Stand Details (indicate cue type and reason)  Pt relies heavily on UEs when standing    Stand to Sit  5: Supervision    Stand to Sit Details (indicate cue type and reason)  Verbal cues for sequencing;Verbal cues for technique;Verbal cues for precautions/safety    Stand to Sit Details  Heavy reliance on UEs to sit safely.      Ambulation/Gait   Ambulation/Gait  Yes    Ambulation/Gait Assistance  4: Min guard;4: Min  assist    Ambulation/Gait Assistance Details  Pt only able to tolerate short distance up to approx 40' with RW prior to needing rest break.  Pt has to perform L hip hike to clear LLE during gait and relies heavily on UEs on RW for safe gait.     Ambulation Distance (Feet)  40 Feet 25    Assistive device  Rolling walker    Gait Pattern  Step-to pattern;Decreased stride length;Decreased hip/knee flexion - left;Decreased dorsiflexion - left;Left hip hike;Left steppage;Lateral hip instability;Decreased trunk rotation;Trunk flexed;Poor foot clearance - left    Ambulation Surface  Indoor;Level    Gait velocity  -- attempted to assess, however pt fatigued and time constraint      Standardized Balance Assessment   Standardized Balance Assessment  Timed Up and Go Test      Timed Up and Go Test   Normal TUG (seconds)  51.16 with RW             Objective measurements completed on examination: See above findings.              PT Education - 06/02/17 1945    Education provided  Yes    Education Details  evaluation findings, POC, goals    Person(s) Educated  Patient    Methods  Explanation    Comprehension  Verbalized understanding       PT Short Term Goals - 06/02/17 1958      PT SHORT TERM GOAL #1   Title  Pt will initiate HEP in order to indicate improved functional mobility and decreased fall risk.  (Target Date: 07/02/17)    Time  4    Period  Weeks    Status  New    Target Date  07/02/17      PT SHORT TERM GOAL #2   Title  Will assess gait speed and improve by .60 ft/sec in order to indicate improved efficiency of gait and decreased fall risk.     Time  4    Period  Weeks    Status  New      PT SHORT TERM GOAL #3   Title  Pt will improve TUG to </=40 secs w/ LRAD in order to indicate decreased fall risk.      Time  4    Period  Weeks    Status  New      PT SHORT TERM GOAL #4   Title  Pt will ambulate 100' w/ LRAD at S level in order to indicate safe negotiation  around home.     Time  4    Period  Days    Status  New      PT SHORT TERM GOAL #5   Title  Pt will ascend/descend 3 steps with handrails at S level in order to indicate safety with home/community entry.     Time  4    Period  Weeks    Status  New        PT Long Term Goals - 06/02/17 2001      PT LONG TERM GOAL #1   Title  Pt will be independent with HEP in order to indicate improved functional mobility and decreased fall risk.  (Target Date: 08/01/17)    Time  8    Period  Weeks    Status  New    Target Date  08/01/17      PT LONG TERM GOAL #2   Title  Pt will improve gait speed by 1.20 ft/sec from baseline in order to indicate decreased fall risk.     Time  8    Period  Weeks    Status  New      PT LONG TERM GOAL #3   Title  Pt will perform TUG </=30 secs w/ LRAD in order to indicate decreased fall risk.      Time  8    Period  Weeks    Status  New      PT LONG TERM GOAL #4   Title  Pt will ambulate 300' w/ LRAD over indoor surfaces at mod I level in order to indicate safe negotation in home.     Time  8    Period  Weeks    Status  New      PT LONG TERM GOAL #5   Title  Pt will ambulate 200' w/ LRAD over paved outdoor surfaces at S level in order to indicate pt slow return to community gait.      Time  8    Period  Weeks    Status  New             Plan - 06/02/17 1949    Clinical Impression Statement  Pt presents s/p thoracic level spinal cord infarct on 03/07/17 with CIR stay from 11/20-12/12/18 and HHPT until end of January.  Note history of aortic arch replacement and bypass of CCA and L subclavian along with other heart surgery one year ago.   Upon PT evaluation, note BLE weakness (L>R) causing increased fall risk, decreased gait speed (unable to get time today due to fatigue), TUG time of >50 secs indicative of increased fall risk.  Pt also has very limited endurance and demonstrates dizziness/light headedness during session.  BP WFL (106/53 prior to  activity and 124/63 following gait and pt reporting feeling dizzy).  Pt will benefit from skilled OP neuro PT in order to address deficits.      History and Personal Factors relevant to plan of care:  see above    Clinical Presentation  Evolving    Clinical Decision Making  Moderate    Rehab Potential  Good    PT Frequency  3x / week then 2x/wk for 4 more weeks    PT Duration  4 weeks then 2x/wk for 4 more weeks    PT Treatment/Interventions  ADLs/Self Care Home Management;Electrical Stimulation;DME Instruction;Gait training;Stair training;Functional mobility training;Therapeutic activities;Therapeutic exercise;Balance training;Neuromuscular re-education;Patient/family education;Orthotic Fit/Training;Vestibular    PT Next Visit Plan  Gait speed (put in STGs), trial braces for LLE to improve clearance, HEP for BLE strengthening (he is supposed to bring in HEP from hospital and HHPT), BLE NMR, endurance    Consulted and Agree with Plan of Care  Patient       Patient will benefit from skilled therapeutic intervention in order to improve the following deficits and impairments:  Abnormal gait, Cardiopulmonary status limiting activity, Decreased activity tolerance, Decreased balance, Decreased endurance, Decreased knowledge of use of DME, Decreased mobility, Decreased strength, Impaired perceived functional ability, Impaired flexibility, Postural dysfunction, Impaired tone  Visit Diagnosis: Paraplegia, incomplete (HCC) - Plan: PT plan of care cert/re-cert  Unsteadiness on feet - Plan: PT plan of care cert/re-cert  Muscle weakness (generalized) - Plan: PT plan of care cert/re-cert  Other abnormalities of gait and mobility - Plan: PT plan of care cert/re-cert  Abnormal posture - Plan: PT plan of care cert/re-cert     Problem List Patient Active Problem List   Diagnosis Date Noted  . Nerve pain 05/18/2017  . Spinal cord infarction (HCC) 03/15/2017  . Paraplegia (HCC) 03/07/2017  .  Bilateral  leg weakness 03/07/2017  . S/P thoracic aortic aneurysm repair 01/21/2017  . Coronary artery calcification 12/23/2016  . S/P aortic dissection repair 12/23/2016  . Thoracic aortic aneurysm without rupture (HCC) 12/23/2016  . Preoperative cardiovascular examination 12/23/2016  . Essential hypertension 09/23/2015  . Iron deficiency anemia 01/28/2015  . Weight loss 10/30/2014  . Hospital-acquired pneumonia 10/09/2014  . Ischemic leg 10/09/2014  . Aortic dissection (HCC) 10/07/2014    Harriet ButteEmily Amaar Oshita, PT, MPT [facility] [address] [place], [place], [address] Phone: [phone]   Fax:  [phone] 06/02/17, 8:09 PM  Name: Victor [name] MRN: [id number] Date of Birth: Oct 19, 1955

## 2017-06-06 ENCOUNTER — Telehealth: Payer: Self-pay | Admitting: Physical Medicine & Rehabilitation

## 2017-06-06 DIAGNOSIS — G9511 Acute infarction of spinal cord (embolic) (nonembolic): Secondary | ICD-10-CM

## 2017-06-06 DIAGNOSIS — G8220 Paraplegia, unspecified: Secondary | ICD-10-CM

## 2017-06-06 DIAGNOSIS — M792 Neuralgia and neuritis, unspecified: Secondary | ICD-10-CM

## 2017-06-06 NOTE — Telephone Encounter (Signed)
Patient called twice 2:35 and 2:40 requesting refill on his hydrocodone wants a return call [phone]

## 2017-06-07 MED ORDER — HYDROCODONE-ACETAMINOPHEN 5-325 MG PO TABS: 1.0000 | ORAL_TABLET | Freq: Three times a day (TID) | ORAL | 0 refills | Status: DC | PRN

## 2017-06-07 NOTE — Telephone Encounter (Signed)
MAY REFILL

## 2017-06-07 NOTE — Telephone Encounter (Signed)
I notified MR Gerry that Victor [name] will refill electronically to his pharmacy.

## 2017-06-07 NOTE — Telephone Encounter (Signed)
Do you want to refill? 

## 2017-06-07 NOTE — Telephone Encounter (Signed)
Hydrocodone prescription e-scribe to pharmacy. Last prescription picked up on 05/18/2017. [name] is aware of the above.

## 2017-06-08 ENCOUNTER — Encounter: Payer: Self-pay | Admitting: Neurology

## 2017-06-08 ENCOUNTER — Ambulatory Visit: Payer: Medicaid Other | Admitting: Neurology

## 2017-06-08 VITALS — BP 100/53 | HR 71 | Wt 143.0 lb

## 2017-06-08 DIAGNOSIS — G9511 Acute infarction of spinal cord (embolic) (nonembolic): Secondary | ICD-10-CM

## 2017-06-08 NOTE — Patient Instructions (Signed)
I had a long discussion with the patient, his wife and nephew regarding his spinal cord infarct and paraplegia. I'm pleased to see that he is showing improvement. I encouraged him to continue outpatient physical and occupational therapy and the use of walker at all times. Continue aspirin. He will return for follow-up in 3 months with minerals practitioner or call earlier if necessary

## 2017-06-08 NOTE — Progress Notes (Signed)
[facility] [address] [place]. [place] [address] [phone]       OFFICE FOLLOW-UP NOTE  [name] Date of Birth:  Sep 06, 1955 Medical Record Number:  [id number]   HPI: Mr Victor [name] is a 62 year old African-American gentleman is seen today for first office follow-up visit for hospital admission for paraplegia in November 2018. He is accompanied by his nephew and wife and history is obtained from them as well as review of electronic medical records. I have personally reviewed imaging films. Victor [name] is an 62 y.o. male who presented from home to [facility] on 03/07/2017 with acute lower extremity paralysis. He is a poor and inconsistent historian, requiring questions to be repeated multiple times for clarification many times during the interview. Best information obtained is as follows: He was in [facility] until about 3 weeks ago when he started to feel weak in the legs. He works as a Paediatric nurse and this would be noticed at times during the day, especially when attempting to stand up from a seated position. He denies having had any arm weakness, vision changes, confusion, trouble talking or sensory numbness during the 3 week time period. He does report having occasional SOB. This weekend, symptoms worsened on Saturday with trouble standing, so he decided to spend most of the day in bed watching TV. He had similar symptoms on Sunday, so he spent the day watching football on TV while lying in bed. He gives inconsistent reports regarding Sunday, at one point stating he spent the whole day in bed, and at another point stating that he could get up and walk. He states he lay in bed until about 1-2 AM on Monday morning (today), when he then tried to get up but legs gave out from under him. He fell to the floor per one account and slid downwards by another account. He could not get up after he slid/fell to the floor. He called out to his parents, who he lives with, and they  called EMS. He denied bowel or bladder incontinence, but states that for the past 3-4 weeks he has had trouble fully emptying his bladder. 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 His clinical exam showed a T10 level sensory level but flaccid paraparesis and MRI scan showed spinal cord hyperintensity from T9 down which was felt compatible with spinal cord infarct. A lumbar epidural drain was placed and he was also started on steroids. 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 Patient showed slight improvement and was able to move lower extremities slightly. No hospitalization. He was seen by physical occupational therapy and transferred for rehabilitation. He has been getting home physical and occupational therapy and plans to start outpatient therapy soon. He has been followed at rehabilitation clinic by Dr. Caren Hazy. He has shown some improvement and now is able to walk with a walker at home. He complains of some aching in burning pain in his legs and some constipation. He is able to empty his bladder quite well he has only occasional incontinence.     ROS:   14 system review of systems is positive for  dizziness, headache, leg weakness, gait difficulty following the systems negative PMH:  Past Medical  History:  Diagnosis Date  . History of kidney stones   . Hypertension   . 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 Stroke (HCC)   . Thoracic aortic aneurysm (HCC) 09/2014    Social History:  Social History   Socioeconomic History  . Marital status: Single    Spouse name: Not on file  . Number of children: Not on file  . Years of education: Not on file  .  Highest education level: Not on file  Social Needs  . Financial resource strain: Not on file  . Food insecurity - worry: Not on file  . Food insecurity - inability: Not on file  . Transportation needs - medical: Not on file  . Transportation needs - non-medical: Not on file  Occupational History  . Not on file  Tobacco Use  . Smoking status: Former Smoker    Years: 35.00    Types: Cigarettes, Cigars    Last attempt to quit: 10/07/2014    Years since quitting: 2.6  . Smokeless tobacco: Never Used  Substance and Sexual Activity  . Alcohol use: No    Alcohol/week: 0.0 oz  . Drug use: No  . Sexual activity: Not on file  Other Topics Concern  . Not on file  Social History Narrative  . Not on file    Medications:   Current Outpatient Medications on File Prior to Visit  Medication Sig Dispense Refill  . aspirin 81 MG EC tablet Take 1 tablet (81 mg total) by mouth daily. (Patient taking differently: Take 162 mg by mouth daily. )    . bisacodyl (DULCOLAX) 10 MG suppository Place 1 suppository (10 mg total) rectally daily at 6 (six) AM. 12 suppository 0  . gabapentin (NEURONTIN) 100 MG capsule Take 1 capsule (100 mg total) by mouth 3 (three) times daily. 90 capsule 3  . HYDROcodone-acetaminophen (NORCO/VICODIN) 5-325 MG tablet Take 1 tablet by mouth every 8 (eight) hours as needed for moderate pain. 45 tablet 0  . metoprolol tartrate (LOPRESSOR) 25 MG tablet Take 0.5 tablets (12.5 mg total) by mouth 2 (two) times daily. Hold dose if BP<100/60 or pulse<50 your are symptomatic 30 tablet 2  . pantoprazole (PROTONIX) 40 MG tablet Take 1 tablet (40 mg total) by mouth daily. 30 tablet 2  . senna-docusate (SENOKOT-S) 8.6-50 MG tablet Take 2 tablets by mouth at bedtime.    . traZODone (DESYREL) 100 MG tablet Take 1 tablet (100 mg total) by mouth at bedtime. 30 tablet 2   No current facility-administered medications on file prior to visit.     Allergies:  No Known Allergies  Physical  Exam General: Frail middle-aged African American male, seated, in no evident distress Head: head normocephalic and atraumatic.  Neck: supple with no carotid or supraclavicular bruits Cardiovascular: regular rate and rhythm, no murmurs Musculoskeletal: no deformity Skin:  no rash/petichiae Vascular:  Normal pulses all extremities Vitals:   06/08/17 0904  BP: (!) 100/53  Pulse: 71   Neurologic Exam Mental Status: Awake and fully alert. Oriented to place and time. Recent and remote memory intact. Attention span, concentration and fund of knowledge appropriate. Mood and affect appropriate.  Cranial Nerves: Fundoscopic exam reveals sharp disc margins. Pupils equal, briskly reactive to light. Extraocular movements full without nystagmus. Visual fields full to confrontation. Hearing intact. Facial sensation intact. Face, tongue, palate moves normally and symmetrically.  Motor: Diminished bulk in lower extremities with slight increased tone. Normal strength in upper extremities. Paraparesis with bilateral hip 3/5 in  the flexors 4/5 in the hip extensors 3/5 in the adductor and abductor is with slightly greater stent on the right compared to the left. Knee extensors 3/5 bilaterally knee flexors 2/5. Bilateral foot drop with ankle dorsiflexors 1/5 with 3/5 plantar flexors bilaterally.  Plantars not checked. Touch pinprick. Sensory.: intact to touch ,pinprick .position and vibratory sensation. No sensory level on the trunk Coordination: Rapid alternating movements normal in all extremities. Finger-to-nose and heel-to-shin performed accurately bilaterally. Gait and Station: Not checked as patient did not bring his walker  Reflexes: Deep tendon reflexes are brisk throughout. In sustained clonus at the right ankle. Toes downgoing.       ASSESSMENT: 62year-old African-American male with paraplegia secondary to spinal cord infarct in November 2018 following aortic dissection surgery in September  2018.    PLAN: I had a long discussion with the patient, his wife and nephew regarding his spinal cord infarct and paraplegia. I'm pleased to see that he is showing improvement. I encouraged him to continue outpatient physical and occupational therapy and the use of walker at all times. Continue aspirin. He will return for follow-up in 3 months with minerals practitioner or call earlier if necessary. Continue outpatient follow-up with Dr. Hermelinda Medicus in the rehabilitation clinic Greater than 50% of time during this 25 minute visit was spent on counseling,explanation of diagnosis of spinal cord infarct and paraplegia, planning of further management, discussion with patient and family and coordination of care Delia Heady, MD  [facility] [address] [place], [place] [address]  Phone [phone] Fax [phone] Note: This document was prepared with digital dictation and possible smart phrase technology. Any transcriptional errors that result from this process are unintentional

## 2017-06-09 ENCOUNTER — Ambulatory Visit: Payer: Medicaid Other | Admitting: Rehabilitation

## 2017-06-09 DIAGNOSIS — R293 Abnormal posture: Secondary | ICD-10-CM

## 2017-06-09 DIAGNOSIS — R2689 Other abnormalities of gait and mobility: Secondary | ICD-10-CM

## 2017-06-09 DIAGNOSIS — R2681 Unsteadiness on feet: Secondary | ICD-10-CM

## 2017-06-09 DIAGNOSIS — M6281 Muscle weakness (generalized): Secondary | ICD-10-CM

## 2017-06-09 DIAGNOSIS — G8222 Paraplegia, incomplete: Secondary | ICD-10-CM

## 2017-06-09 NOTE — Patient Instructions (Addendum)
Heel Slides    Squeeze pelvic floor and hold. Slide left heel along bed towards bottom. Hold for _2-3__ seconds. Slide back to flat knee position. Repeat _10__ times. Do _1-2__ times a day. Repeat with other leg.  Use strap if needed to help your leg bend, but use as much muscle strength as you can.  Also have family assist as needed to make sure knee stays in line with body.    Copyright  VHI. All rights reserved.    Bridging: Supine Up/Down (Active - Assistance)    Lie with knees bent, feet flat. With assistance, push hips up. Hold _2-3__ seconds.  Have family assist with holding knees to prevent them from going out to the side.  Only slightly lift so that you can focus on keeping knees together.  Complete _1__ sets of _10__ repetitions. Perform _1-2__ sessions per day.  Copyright  VHI. All rights reserved.   Quad Set    With other leg bent, foot flat, slowly tighten muscles on thigh of straight leg while counting out loud to _5_. Repeat with other leg.  Or you can do both legs at the same time.   Repeat __10__ times. Do __1-2__ sessions per day.  http://[email or web address].exer.us/275   Copyright  VHI. All rights reserved.   Internal Rotation: ROM In Extension (Supine)    You do not need anyone to assist.  Lying on your back with legs straight.  Let feet fall out to the side and you work on bringing your feet back up pointing to ceiling.  Repeat x 10 reps.    Copyright  VHI. All rights reserved.

## 2017-06-09 NOTE — Therapy (Signed)
[facility] [address] [place], [place], [address] Phone: [phone]   Fax:  [phone]  Physical Therapy Treatment  Patient Details  Name: Victor [name] MRN: [id number] Date of Birth: 1955-05-30 Referring Provider: Faith Rogue, MD   Encounter Date: 06/09/2017  PT End of Session - 06/09/17 1202    Visit Number  2    Number of Visits  21    Date for PT Re-Evaluation  08/01/17    Authorization Type  Medicaid pending-self pay until then    PT Start Time  1102    PT Stop Time  1147    PT Time Calculation (min)  45 min    Equipment Utilized During Treatment  Gait belt    Activity Tolerance  Patient tolerated treatment well;Patient limited by fatigue    Behavior During Therapy  [facility] for tasks assessed/performed       Past Medical History:  Diagnosis Date  . History of kidney stones   . Hypertension   . 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 Stroke (HCC)   . Thoracic aortic aneurysm (HCC) 09/2014    Past Surgical History:  Procedure Laterality Date  . ASCENDING AORTIC ROOT REPLACEMENT N/A 01/21/2017   Procedure: Replacement of Aortic Arch with circulatory arrest;  Surgeon: [name], MD;  Location: [facility] OR;  Service: Open Heart Surgery;  Laterality: N/A;  . CARDIAC CATHETERIZATION    . FEMORAL-FEMORAL BYPASS GRAFT N/A 10/07/2014   Procedure: LEFT  FEMORAL ARTERY -RIGHT FEMORAL ARTERY BYPASS GRAFT USING X 30 CM HEMASHIELD GOLD GRAFT;  Surgeon: Larina Earthly, MD;  Location: [facility] OR;  Service: Vascular;  Laterality: N/A;  . RIGHT/LEFT HEART CATH AND CORONARY ANGIOGRAPHY N/A 01/11/2017   Procedure: RIGHT/LEFT HEART CATH AND CORONARY ANGIOGRAPHY- Diagnostic Only;  Surgeon: [name], MD;  Location: [facility] INVASIVE CV LAB;  Service: Cardiovascular;  Laterality: N/A;  . s/p thoracic aneurysm repair    . STERNOTOMY N/A 01/21/2017   Procedure: REDO STERNOTOMY;  Surgeon: [name], MD;  Location: [facility] OR;   Service: Open Heart Surgery;  Laterality: N/A;  . TEE WITHOUT CARDIOVERSION N/A 01/21/2017   Procedure: TRANSESOPHAGEAL ECHOCARDIOGRAM (TEE);  Surgeon: [name], MD;  Location: [facility] OR;  Service: Open Heart Surgery;  Laterality: N/A;  . THORACIC AORTIC ANEURYSM REPAIR N/A 10/06/2014   Procedure: THORACIC ASCENDING ANEURYSM REPAIR (AAA);  Surgeon: [name], MD;  Location: [facility] OR;  Service: Open Heart Surgery;  Laterality: N/A;  hyportermia circulatory arrest and resuspension of aortic valve  . THORACIC AORTIC ENDOVASCULAR STENT GRAFT N/A 01/21/2017   Procedure: THORACIC AORTIC ENDOVASCULAR STENT GRAFT;  Surgeon: Nada Libman, MD;  Location: [facility] OR;  Service: Vascular;  Laterality: N/A;  . THROMBECTOMY ILIAC ARTERY Left 03/08/2017   Procedure: THROMBECTOMY of Left subclavian artery;  Surgeon: Chuck Hint, MD;  Location: [facility] OR;  Service: Vascular;  Laterality: Left;  Marland Kitchen VASCULAR SURGERY      There were no vitals filed for this visit.  Subjective Assessment - 06/09/17 1157    Subjective  Pt reports no changes, no falls.     Pertinent History  Hx of aortic arch replacement and bypass to CCA and L subclavian a. 01/21/17, unsuccessful thrombectomy L bracial a on 03/08/17    Limitations  House hold activities;Walking    How long can you walk comfortably?  approx 30-50' with RW    Patient Stated Goals  "I want to be able to walk"  Currently in Pain?  No/denies                      [facility] Adult PT Treatment/Exercise - 06/09/17 1158      Ambulation/Gait   Ambulation/Gait  Yes    Ambulation/Gait Assistance  4: Min guard;4: Min assist    Ambulation/Gait Assistance Details  Had pt ambulate with use of LLE reaction AFO.  Note marked improvement in L foot clearance and decreased effort to clear LLE during swing.  Educated on purpose and use of brace and that PT would send request to MD for order.  Pt verbalized understanding.  Provided cues during gait for  upright posture, increased L knee flexion during swing and relaxed posture on RW.      Ambulation Distance (Feet)  50 Feet then 60'    Assistive device  Rolling walker ottobock reaction AFO on LLE    Gait Pattern  Step-to pattern;Decreased stride length;Decreased hip/knee flexion - left;Decreased dorsiflexion - left;Left hip hike;Left steppage;Lateral hip instability;Decreased trunk rotation;Trunk flexed;Poor foot clearance - left    Ambulation Surface  Level;Indoor      Neuro Re-ed    Neuro Re-ed Details   Provided initial HEP for BLE NMR/strengthening, see pt instruction for details.               PT Education - 06/09/17 1202    Education provided  Yes    Education Details  getting order for AFO, use of estim at next session.     Person(s) Educated  Patient    Methods  Explanation    Comprehension  Verbalized understanding       PT Short Term Goals - 06/02/17 1958      PT SHORT TERM GOAL #1   Title  Pt will initiate HEP in order to indicate improved functional mobility and decreased fall risk.  (Target Date: 07/02/17)    Time  4    Period  Weeks    Status  New    Target Date  07/02/17      PT SHORT TERM GOAL #2   Title  Will assess gait speed and improve by .60 ft/sec in order to indicate improved efficiency of gait and decreased fall risk.     Time  4    Period  Weeks    Status  New      PT SHORT TERM GOAL #3   Title  Pt will improve TUG to </=40 secs w/ LRAD in order to indicate decreased fall risk.      Time  4    Period  Weeks    Status  New      PT SHORT TERM GOAL #4   Title  Pt will ambulate 100' w/ LRAD at S level in order to indicate safe negotiation around home.     Time  4    Period  Days    Status  New      PT SHORT TERM GOAL #5   Title  Pt will ascend/descend 3 steps with handrails at S level in order to indicate safety with home/community entry.     Time  4    Period  Weeks    Status  New        PT Long Term Goals - 06/02/17 2001      PT  LONG TERM GOAL #1   Title  Pt will be independent with HEP in order to indicate improved functional mobility and decreased  fall risk.  (Target Date: 08/01/17)    Time  8    Period  Weeks    Status  New    Target Date  08/01/17      PT LONG TERM GOAL #2   Title  Pt will improve gait speed by 1.20 ft/sec from baseline in order to indicate decreased fall risk.     Time  8    Period  Weeks    Status  New      PT LONG TERM GOAL #3   Title  Pt will perform TUG </=30 secs w/ LRAD in order to indicate decreased fall risk.      Time  8    Period  Weeks    Status  New      PT LONG TERM GOAL #4   Title  Pt will ambulate 300' w/ LRAD over indoor surfaces at mod I level in order to indicate safe negotation in home.     Time  8    Period  Weeks    Status  New      PT LONG TERM GOAL #5   Title  Pt will ambulate 200' w/ LRAD over paved outdoor surfaces at S level in order to indicate pt slow return to community gait.      Time  8    Period  Weeks    Status  New            Plan - 06/09/17 1203    Clinical Impression Statement  Session focused on providing pt with initial HEP for BLE NMR/strength and gait assessment with use of L reaction AFO.  Will request order for AFO from Dr. Riley Kill today.  Pt verbalized understanding.  Mother and nephew present during session as PT discussed/educated on how to assist pt with exercises.     Rehab Potential  Good    PT Frequency  3x / week then 2x/wk for 4 more weeks    PT Duration  4 weeks then 2x/wk for 4 more weeks    PT Treatment/Interventions  ADLs/Self Care Home Management;Electrical Stimulation;DME Instruction;Gait training;Stair training;Functional mobility training;Therapeutic activities;Therapeutic exercise;Balance training;Neuromuscular re-education;Patient/family education;Orthotic Fit/Training;Vestibular    PT Next Visit Plan  Gait speed (put in STGs), Bioness on LLE (could probably benefit from BLE, but lets start with L) , BLE NMR, endurance     Consulted and Agree with Plan of Care  Patient       Patient will benefit from skilled therapeutic intervention in order to improve the following deficits and impairments:  Abnormal gait, Cardiopulmonary status limiting activity, Decreased activity tolerance, Decreased balance, Decreased endurance, Decreased knowledge of use of DME, Decreased mobility, Decreased strength, Impaired perceived functional ability, Impaired flexibility, Postural dysfunction, Impaired tone  Visit Diagnosis: Paraplegia, incomplete (HCC)  Unsteadiness on feet  Muscle weakness (generalized)  Other abnormalities of gait and mobility  Abnormal posture     Problem List Patient Active Problem List   Diagnosis Date Noted  . Nerve pain 05/18/2017  . Spinal cord infarction (HCC) 03/15/2017  . Paraplegia (HCC) 03/07/2017  . Bilateral leg weakness 03/07/2017  . S/P thoracic aortic aneurysm repair 01/21/2017  . Coronary artery calcification 12/23/2016  . S/P aortic dissection repair 12/23/2016  . Thoracic aortic aneurysm without rupture (HCC) 12/23/2016  . Preoperative cardiovascular examination 12/23/2016  . Essential hypertension 09/23/2015  . Iron deficiency anemia 01/28/2015  . Weight loss 10/30/2014  . Hospital-acquired pneumonia 10/09/2014  . Ischemic leg 10/09/2014  . Aortic dissection (  HCC) 10/07/2014    Harriet ButteEmily Mate Alegria, PT, MPT [facility] [address] [place], [place], [address] Phone: [phone]   Fax:  [phone] 06/09/17, 12:06 PM  Name: Victor [name] MRN: [id number] Date of Birth: 1955-08-26

## 2017-06-10 ENCOUNTER — Encounter: Payer: Self-pay | Admitting: Physical Therapy

## 2017-06-10 ENCOUNTER — Telehealth: Payer: Self-pay | Admitting: Physical Therapy

## 2017-06-10 ENCOUNTER — Other Ambulatory Visit: Payer: Self-pay | Admitting: Surgical

## 2017-06-10 ENCOUNTER — Ambulatory Visit: Payer: Medicaid Other | Admitting: Physical Therapy

## 2017-06-10 DIAGNOSIS — Y95 Nosocomial condition: Principal | ICD-10-CM

## 2017-06-10 DIAGNOSIS — J189 Pneumonia, unspecified organism: Secondary | ICD-10-CM

## 2017-06-10 DIAGNOSIS — G8220 Paraplegia, unspecified: Secondary | ICD-10-CM

## 2017-06-10 DIAGNOSIS — M6281 Muscle weakness (generalized): Secondary | ICD-10-CM

## 2017-06-10 DIAGNOSIS — R2681 Unsteadiness on feet: Secondary | ICD-10-CM

## 2017-06-10 DIAGNOSIS — G8222 Paraplegia, incomplete: Secondary | ICD-10-CM | POA: Diagnosis not present

## 2017-06-10 DIAGNOSIS — R2689 Other abnormalities of gait and mobility: Secondary | ICD-10-CM

## 2017-06-10 NOTE — Telephone Encounter (Signed)
Dr. Riley KillSwartz,  Manus RuddMarcus Potenza is being treated by physical therapy for weakness, balance issues and gait issues following spinal cord infarct.  He will benefit from use of left AFO in order to improve safety with functional mobility.    If you agree, please submit request in EPIC under referral for DME (list left AFO in comments) or fax to [facility] at [phone].   Thank you,  Sallyanne KusterKathy Ladeja Pelham, PTA, [facility] [address], [address] [place], [place] [address] [phone] 06/10/17, 4:47 PM   [facility] [address] [place], [place]  [address] Phone:  [phone] Fax:  [phone]

## 2017-06-10 NOTE — Therapy (Signed)
[facility] [address] [place], [place], [address] Phone: [phone]   Fax:  [phone]  Physical Therapy Treatment  Patient Details  Name: Victor [name] MRN: [id number] Date of Birth: 04/07/1956 Referring Provider: Faith Rogue, MD   Encounter Date: 06/10/2017  PT End of Session - 06/10/17 1022    Visit Number  3    Number of Visits  21    Date for PT Re-Evaluation  08/01/17    Authorization Type  Medicaid pending-self pay until then    PT Start Time  1019    PT Stop Time  1100    PT Time Calculation (min)  41 min    Equipment Utilized During Treatment  Gait belt;Other (comment) Bioness    Activity Tolerance  Patient tolerated treatment well;Patient limited by fatigue    Behavior During Therapy  [facility] for tasks assessed/performed       Past Medical History:  Diagnosis Date  . History of kidney stones   . Hypertension   . 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 Stroke (HCC)   . Thoracic aortic aneurysm (HCC) 09/2014    Past Surgical History:  Procedure Laterality Date  . ASCENDING AORTIC ROOT REPLACEMENT N/A 01/21/2017   Procedure: Replacement of Aortic Arch with circulatory arrest;  Surgeon: [name], MD;  Location: [facility] OR;  Service: Open Heart Surgery;  Laterality: N/A;  . CARDIAC CATHETERIZATION    . FEMORAL-FEMORAL BYPASS GRAFT N/A 10/07/2014   Procedure: LEFT  FEMORAL ARTERY -RIGHT FEMORAL ARTERY BYPASS GRAFT USING X 30 CM HEMASHIELD GOLD GRAFT;  Surgeon: Larina Earthly, MD;  Location: [facility] OR;  Service: Vascular;  Laterality: N/A;  . RIGHT/LEFT HEART CATH AND CORONARY ANGIOGRAPHY N/A 01/11/2017   Procedure: RIGHT/LEFT HEART CATH AND CORONARY ANGIOGRAPHY- Diagnostic Only;  Surgeon: [name], MD;  Location: [facility] INVASIVE CV LAB;  Service: Cardiovascular;  Laterality: N/A;  . s/p thoracic aneurysm repair    . STERNOTOMY N/A 01/21/2017   Procedure: REDO STERNOTOMY;  Surgeon: [name], MD;  Location: [facility] OR;  Service: Open Heart Surgery;  Laterality: N/A;  . TEE WITHOUT CARDIOVERSION N/A 01/21/2017   Procedure: TRANSESOPHAGEAL ECHOCARDIOGRAM (TEE);  Surgeon: [name], MD;  Location: [facility] OR;  Service: Open Heart Surgery;  Laterality: N/A;  . THORACIC AORTIC ANEURYSM REPAIR N/A 10/06/2014   Procedure: THORACIC ASCENDING ANEURYSM REPAIR (AAA);  Surgeon: [name], MD;  Location: [facility] OR;  Service: Open Heart Surgery;  Laterality: N/A;  hyportermia circulatory arrest and resuspension of aortic valve  . THORACIC AORTIC ENDOVASCULAR STENT GRAFT N/A 01/21/2017   Procedure: THORACIC AORTIC ENDOVASCULAR STENT GRAFT;  Surgeon: Nada Libman, MD;  Location: [facility] OR;  Service: Vascular;  Laterality: N/A;  . THROMBECTOMY ILIAC ARTERY Left 03/08/2017   Procedure: THROMBECTOMY of Left subclavian artery;  Surgeon: Chuck Hint, MD;  Location: [facility] OR;  Service: Vascular;  Laterality: Left;  Marland Kitchen VASCULAR SURGERY      There were no vitals filed for this visit.  Subjective Assessment - 06/10/17 1022    Subjective  No new complaints. No falls or pain to report.     Pertinent History  Hx of aortic arch replacement and bypass to CCA and L subclavian a. 01/21/17, unsuccessful thrombectomy L bracial a on 03/08/17    Limitations  House hold activities;Walking    How long can you walk comfortably?  approx 30-50' with RW    Patient Stated Goals  "I want  to be able to walk"     Currently in Pain?  No/denies           [facility] Adult PT Treatment/Exercise - 06/10/17 1023      Transfers   Transfers  Sit to Stand;Stand to Sit    Sit to Stand  5: Supervision;With armrests;With upper extremity assist;From chair/3-in-1;From bed    Sit to Stand Details  Verbal cues for sequencing;Verbal cues for technique;Verbal cues for precautions/safety    Sit to Stand Details (indicate cue type and reason)  cues for hand placement and weight shifting. heavy reliance on UE's.     Stand to Sit  5:  Supervision;With upper extremity assist;To bed;To chair/3-in-1;With armrests    Stand to Sit Details (indicate cue type and reason)  Verbal cues for sequencing;Verbal cues for technique;Verbal cues for precautions/safety    Stand to Sit Details  cues to reach back with heavy reliance on UE's.     Stand Pivot Transfers  3: Mod assist    Stand Pivot Transfer Details (indicate cue type and reason)  wheelchair to/from mat table.       Ambulation/Gait   Ambulation/Gait  Yes    Ambulation/Gait Assistance  4: Min assist    Ambulation/Gait Assistance Details  concurrent with Bioness for increased DF assist and knee stability on left LE. thigh cuff on quads limited pt's ability to advance left LE, so turned it off with just lower cuff on for last 10 feet with improvement noted. manual assist needed for knee control and for knee flexion with LE advancement.     Ambulation Distance (Feet)  30 Feet x1    Assistive device  Rolling walker    Gait Pattern  Step-to pattern;Decreased stride length;Decreased hip/knee flexion - left;Decreased dorsiflexion - left;Left hip hike;Left steppage;Lateral hip instability;Decreased trunk rotation;Trunk flexed;Poor foot clearance - left    Ambulation Surface  Level;Indoor      Programme researcher, broadcasting/film/video Location  left anterior tibialis, left quads    Electrical Stimulation Action  NMR and improved gait mechanics    Electrical Stimulation Parameters  refer to table 1 for adjusted parameters, quickfit electrodes     Electrical Stimulation Goals  Strength;Neuromuscular facilitation           PT Short Term Goals - 06/02/17 1958      PT SHORT TERM GOAL #1   Title  Pt will initiate HEP in order to indicate improved functional mobility and decreased fall risk.  (Target Date: 07/02/17)    Time  4    Period  Weeks    Status  New    Target Date  07/02/17      PT SHORT TERM GOAL #2   Title  Will assess gait speed and improve by .60 ft/sec in order to  indicate improved efficiency of gait and decreased fall risk.     Time  4    Period  Weeks    Status  New      PT SHORT TERM GOAL #3   Title  Pt will improve TUG to </=40 secs w/ LRAD in order to indicate decreased fall risk.      Time  4    Period  Weeks    Status  New      PT SHORT TERM GOAL #4   Title  Pt will ambulate 100' w/ LRAD at S level in order to indicate safe negotiation around home.     Time  4  Period  Days    Status  New      PT SHORT TERM GOAL #5   Title  Pt will ascend/descend 3 steps with handrails at S level in order to indicate safety with home/community entry.     Time  4    Period  Weeks    Status  New        PT Long Term Goals - 06/02/17 2001      PT LONG TERM GOAL #1   Title  Pt will be independent with HEP in order to indicate improved functional mobility and decreased fall risk.  (Target Date: 08/01/17)    Time  8    Period  Weeks    Status  New    Target Date  08/01/17      PT LONG TERM GOAL #2   Title  Pt will improve gait speed by 1.20 ft/sec from baseline in order to indicate decreased fall risk.     Time  8    Period  Weeks    Status  New      PT LONG TERM GOAL #3   Title  Pt will perform TUG </=30 secs w/ LRAD in order to indicate decreased fall risk.      Time  8    Period  Weeks    Status  New      PT LONG TERM GOAL #4   Title  Pt will ambulate 300' w/ LRAD over indoor surfaces at mod I level in order to indicate safe negotation in home.     Time  8    Period  Weeks    Status  New      PT LONG TERM GOAL #5   Title  Pt will ambulate 200' w/ LRAD over paved outdoor surfaces at S level in order to indicate pt slow return to community gait.      Time  8    Period  Weeks    Status  New          Plan - 06/10/17 1626    Clinical Impression Statement  Today's skilled session focused on set up of Bioness to left anterior tibialis and quads for assist with left LE stability with gait. Pt reports his knee buckles at times and  hyperextends at times. Trialed Bioness on quads today with it limiting pt's ability to bend knee for swing phase. Will be better utilized on pt's hamstring with gait. Unable to don there today due to pt's jeans to not allow for it. Pt plans to bring or wear shorts next session. Lower cuff did improve DF with swing phase. Pt should benefit from continued PT to progress toward unmet goals.                               Rehab Potential  Good    PT Frequency  3x / week then 2x week for 4 more weeks    PT Duration  4 weeks then 2x week for 4 more weeks    PT Treatment/Interventions  ADLs/Self Care Home Management;Electrical Stimulation;DME Instruction;Gait training;Stair training;Functional mobility training;Therapeutic activities;Therapeutic exercise;Balance training;Neuromuscular re-education;Patient/family education;Orthotic Fit/Training;Vestibular    PT Next Visit Plan  Gait speed (put in STGs), continue with Bioness on LLE with thigh cuff on hamstrings- intensity will need to be readjusted, BLE NMR, endurance       Patient will benefit from skilled therapeutic intervention in order  to improve the following deficits and impairments:  Abnormal gait, Cardiopulmonary status limiting activity, Decreased activity tolerance, Decreased balance, Decreased endurance, Decreased knowledge of use of DME, Decreased mobility, Decreased strength, Impaired perceived functional ability, Impaired flexibility, Postural dysfunction, Impaired tone  Visit Diagnosis: Paraplegia, incomplete (HCC)  Unsteadiness on feet  Muscle weakness (generalized)  Other abnormalities of gait and mobility     Problem List Patient Active Problem List   Diagnosis Date Noted  . Nerve pain 05/18/2017  . Spinal cord infarction (HCC) 03/15/2017  . Paraplegia (HCC) 03/07/2017  . Bilateral leg weakness 03/07/2017  . S/P thoracic aortic aneurysm repair 01/21/2017  . Coronary artery calcification 12/23/2016  . S/P aortic dissection  repair 12/23/2016  . Thoracic aortic aneurysm without rupture (HCC) 12/23/2016  . Preoperative cardiovascular examination 12/23/2016  . Essential hypertension 09/23/2015  . Iron deficiency anemia 01/28/2015  . Weight loss 10/30/2014  . Hospital-acquired pneumonia 10/09/2014  . Ischemic leg 10/09/2014  . Aortic dissection (HCC) 10/07/2014    Sallyanne KusterKathy Bury, PTA, [facility] [address] [place], [place] [address] [phone] 06/10/17, 4:45 PM   Name: Karna ChristmasMarcus E Broeker MRN: [id number] Date of Birth: Aug 10, 1955

## 2017-06-11 NOTE — Telephone Encounter (Signed)
Will do. Thanks.

## 2017-06-13 ENCOUNTER — Encounter: Payer: Self-pay | Admitting: Physical Therapy

## 2017-06-13 ENCOUNTER — Ambulatory Visit: Payer: Medicaid Other | Admitting: Physical Therapy

## 2017-06-13 DIAGNOSIS — R2681 Unsteadiness on feet: Secondary | ICD-10-CM

## 2017-06-13 DIAGNOSIS — R293 Abnormal posture: Secondary | ICD-10-CM

## 2017-06-13 DIAGNOSIS — M6281 Muscle weakness (generalized): Secondary | ICD-10-CM

## 2017-06-13 DIAGNOSIS — G8222 Paraplegia, incomplete: Secondary | ICD-10-CM

## 2017-06-13 DIAGNOSIS — R2689 Other abnormalities of gait and mobility: Secondary | ICD-10-CM

## 2017-06-14 NOTE — Therapy (Signed)
[facility] [address] [place], [place], [address] Phone: [phone]   Fax:  [phone]  Physical Therapy Treatment  Patient Details  Name: Victor [name] MRN: [id number] Date of Birth: 1955/07/09 Referring Provider: Faith RogueZachary Swartz, MD   Encounter Date: 06/13/2017  PT End of Session - 06/13/17 1708    Visit Number  4    Number of Visits  21    Date for PT Re-Evaluation  08/01/17    Authorization Type  Medicaid pending-self pay until then    PT Start Time  1400    PT Stop Time  1447    PT Time Calculation (min)  47 min    Equipment Utilized During Treatment  Other (comment) trial AFO    Activity Tolerance  Patient tolerated treatment well;Patient limited by fatigue    Behavior During Therapy  [facility] for tasks assessed/performed       Past Medical History:  Diagnosis Date  . History of kidney stones   . Hypertension   . 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 Stroke (HCC)   . Thoracic aortic aneurysm (HCC) 09/2014    Past Surgical History:  Procedure Laterality Date  . ASCENDING AORTIC ROOT REPLACEMENT N/A 01/21/2017   Procedure: Replacement of Aortic Arch with circulatory arrest;  Surgeon: Delight OvensGerhardt, Edward B, MD;  [facility];  Service: Open Heart Surgery;  Laterality: N/A;  . CARDIAC CATHETERIZATION    . FEMORAL-FEMORAL BYPASS GRAFT N/A 10/07/2014   Procedure: LEFT  FEMORAL ARTERY -RIGHT FEMORAL ARTERY BYPASS GRAFT USING 8MM X 30 CM HEMASHIELD GOLD GRAFT;  Surgeon: Larina Earthlyodd F Early, MD;  [facility];  Service: Vascular;  Laterality: N/A;  . RIGHT/LEFT HEART CATH AND CORONARY ANGIOGRAPHY N/A 01/11/2017   Procedure: RIGHT/LEFT HEART CATH AND CORONARY ANGIOGRAPHY- Diagnostic Only;  Surgeon: Tonny Bollmanooper, Michael, MD;  [facility];  Service: Cardiovascular;  Laterality: N/A;  . s/p thoracic aneurysm repair    . STERNOTOMY N/A 01/21/2017   Procedure: REDO STERNOTOMY;  Surgeon: Delight OvensGerhardt, Edward B, MD;   [facility];  Service: Open Heart Surgery;  Laterality: N/A;  . TEE WITHOUT CARDIOVERSION N/A 01/21/2017   Procedure: TRANSESOPHAGEAL ECHOCARDIOGRAM (TEE);  Surgeon: Delight OvensGerhardt, Edward B, MD;  [facility];  Service: Open Heart Surgery;  Laterality: N/A;  . THORACIC AORTIC ANEURYSM REPAIR N/A 10/06/2014   Procedure: THORACIC ASCENDING ANEURYSM REPAIR (AAA);  Surgeon: Delight OvensEdward B Gerhardt, MD;  [facility];  Service: Open Heart Surgery;  Laterality: N/A;  hyportermia circulatory arrest and resuspension of aortic valve  . THORACIC AORTIC ENDOVASCULAR STENT GRAFT N/A 01/21/2017   Procedure: THORACIC AORTIC ENDOVASCULAR STENT GRAFT;  Surgeon: Nada LibmanBrabham, Vance W, MD;  [facility];  Service: Vascular;  Laterality: N/A;  . THROMBECTOMY ILIAC ARTERY Left 03/08/2017   Procedure: THROMBECTOMY of Left subclavian artery;  Surgeon: Chuck Hintickson, Christopher S, MD;  [facility];  Service: Vascular;  Laterality: Left;  Marland Kitchen. VASCULAR SURGERY      There were no vitals filed for this visit.  Subjective Assessment - 06/13/17 1403    Subjective  No issues or falls over the weekend.  Pt saw Bioness and said, "I don't want those, they seemed to make my walking worse last time."  I have some lighter shoes that I can walk better in.      Pertinent History  Hx of aortic arch replacement and bypass to CCA and L subclavian a. 01/21/17, unsuccessful thrombectomy L bracial a on 03/08/17  Limitations  House hold activities;Walking    How long can you walk comfortably?  approx 30-50' with RW    Patient Stated Goals  "I want to be able to walk"          [facility] PT Assessment - 06/13/17 1655      Standardized Balance Assessment   Standardized Balance Assessment  10 meter walk test    10 Meter Walk  41.69 seconds or .76ft/sec without AFO and RW , 40.79 seconds with AFO and RW 0.8 ft/sec                  [facility] Adult PT Treatment/Exercise - 06/13/17 1655      Bed Mobility   Bed Mobility  Supine to Sit;Sit  to Supine    Supine to Sit  4: Min guard    Sit to Supine  4: Min guard      Transfers   Transfers  Sit to Stand;Stand to Sit;Squat Pivot Transfers    Sit to Stand  5: Supervision;With armrests    Stand to Sit  5: Supervision;With armrests    Squat Pivot Transfers  6: Modified independent (Device/Increase time)      Exercises   Exercises  Knee/Hip      Knee/Hip Exercises: Supine   Bridges  Strengthening;Both;1 set;10 reps    Bridges Limitations  therapist assisted to keep hips in neutral rotation    Other Supine Knee/Hip Exercises  single side and then bilat hip ER <> IR x 5 reps each; pt able to perform on RLE without any difficulty.  LLE pt requires assistance to control hip ER and to isolate/complete hip IR without rotating trunk/pelvis    Other Supine Knee/Hip Exercises  Alternating LE marches (hip/knee flexion) with therapist assisting by stabilizing contralateral LE and cues for pt to exhale when flexing hip and knee; assistance also required to initiate on LLE, 10 reps             PT Education - 06/13/17 1659    Education provided  Yes    Education Details  process of getting AFO, lowered w/c leg rests x 1 notch for improved hip<>knee angle and decreasing sacral sitting and pressure on sacrum in w/c    Person(s) Educated  Patient;Parent(s)    Methods  Explanation;Demonstration    Comprehension  Verbalized understanding;Returned demonstration       PT Short Term Goals - 06/14/17 0832      PT SHORT TERM GOAL #1   Title  Pt will initiate HEP in order to indicate improved functional mobility and decreased fall risk.  (Target Date: 07/02/17)    Time  4    Period  Weeks    Status  New    Target Date  07/02/17      PT SHORT TERM GOAL #2   Title  Will improve gait velocity by .60 ft/sec in order to indicate improved efficiency of gait and decreased fall risk.     Baseline  .78 ft/sec with RW and no AFO; .80 with RW and trial AFO    Time  4    Period  Weeks    Status   Revised    Target Date  07/02/17      PT SHORT TERM GOAL #3   Title  Pt will improve TUG to </=40 secs w/ LRAD in order to indicate decreased fall risk.      Time  4    Period  Weeks  Status  New    Target Date  07/02/17      PT SHORT TERM GOAL #4   Title  Pt will ambulate 100' w/ LRAD at S level in order to indicate safe negotiation around home.     Time  4    Period  Days    Status  New    Target Date  07/02/17      PT SHORT TERM GOAL #5   Title  Pt will ascend/descend 3 steps with handrails at S level in order to indicate safety with home/community entry.     Time  4    Period  Weeks    Status  New    Target Date  07/02/17        PT Long Term Goals - 06/02/17 2001      PT LONG TERM GOAL #1   Title  Pt will be independent with HEP in order to indicate improved functional mobility and decreased fall risk.  (Target Date: 08/01/17)    Time  8    Period  Weeks    Status  New    Target Date  08/01/17      PT LONG TERM GOAL #2   Title  Pt will improve gait speed by 1.20 ft/sec from baseline in order to indicate decreased fall risk.     Time  8    Period  Weeks    Status  New      PT LONG TERM GOAL #3   Title  Pt will perform TUG </=30 secs w/ LRAD in order to indicate decreased fall risk.      Time  8    Period  Weeks    Status  New      PT LONG TERM GOAL #4   Title  Pt will ambulate 300' w/ LRAD over indoor surfaces at mod I level in order to indicate safe negotation in home.     Time  8    Period  Weeks    Status  New      PT LONG TERM GOAL #5   Title  Pt will ambulate 200' w/ LRAD over paved outdoor surfaces at S level in order to indicate pt slow return to community gait.      Time  8    Period  Weeks    Status  New            Plan - 06/14/17 0815    Clinical Impression Statement  Use of Bioness functional estim deferred today per patient's request; discussed with pt purpose and benefit of using Bioness on hamstring mm group during gait training and  use in training mode for motor planning and strengthening.  Patient continued to defer use for today's session; will keep electrodes and will continue to discuss with patient.  Rest of session focused on assessment of gait velocity without and then with use of AFO to assess if use of AFO improves patient's gait safety and efficiency.  Pt noted to improve gait velocity by 1 second with use of AFO.  Performed mm activation training and strengthening in supine with pt demonstrating greatest difficulty activating L hamstring and L glute med.  Will continue to address and progress towards LTG.    Rehab Potential  Good    PT Frequency  3x / week then 2x week for 4 more weeks    PT Duration  4 weeks then 2x week for 4 more weeks  PT Treatment/Interventions  ADLs/Self Care Home Management;Electrical Stimulation;DME Instruction;Gait training;Stair training;Functional mobility training;Therapeutic activities;Therapeutic exercise;Balance training;Neuromuscular re-education;Patient/family education;Orthotic Fit/Training;Vestibular    PT Next Visit Plan  pt would like to try use of Nustep for LE strengthening; continue L hamstring and glute strengthening/muscle activation training.  Gait training with RW/trial reaction AFO-likely needs heel wedge due to shoe being so flat.      Recommended Other Services  KATHY - Appointment with [facility] is 2/26 at 1:15 with Thayer Ohm.     Consulted and Agree with Plan of Care  Patient;Family member/caregiver       Patient will benefit from skilled therapeutic intervention in order to improve the following deficits and impairments:  Abnormal gait, Cardiopulmonary status limiting activity, Decreased activity tolerance, Decreased balance, Decreased endurance, Decreased knowledge of use of DME, Decreased mobility, Decreased strength, Impaired perceived functional ability, Impaired flexibility, Postural dysfunction, Impaired tone  Visit Diagnosis: Paraplegia, incomplete  (HCC)  Unsteadiness on feet  Muscle weakness (generalized)  Other abnormalities of gait and mobility  Abnormal posture     Problem List Patient Active Problem List   Diagnosis Date Noted  . Nerve pain 05/18/2017  . Spinal cord infarction (HCC) 03/15/2017  . Paraplegia (HCC) 03/07/2017  . Bilateral leg weakness 03/07/2017  . S/P thoracic aortic aneurysm repair 01/21/2017  . Coronary artery calcification 12/23/2016  . S/P aortic dissection repair 12/23/2016  . Thoracic aortic aneurysm without rupture (HCC) 12/23/2016  . Preoperative cardiovascular examination 12/23/2016  . Essential hypertension 09/23/2015  . Iron deficiency anemia 01/28/2015  . Weight loss 10/30/2014  . Hospital-acquired pneumonia 10/09/2014  . Ischemic leg 10/09/2014  . Aortic dissection (HCC) 10/07/2014   Dierdre Highman, PT, DPT 06/14/17    8:36 AM    [facility] [address] [place], [place], [address] Phone: [phone]   Fax:  [phone]  Name: Victor [name] MRN: [id number] Date of Birth: 1956/03/21

## 2017-06-16 ENCOUNTER — Encounter: Payer: Self-pay | Admitting: Physical Therapy

## 2017-06-16 ENCOUNTER — Ambulatory Visit: Payer: Medicaid Other | Admitting: Physical Therapy

## 2017-06-16 DIAGNOSIS — R2689 Other abnormalities of gait and mobility: Secondary | ICD-10-CM

## 2017-06-16 DIAGNOSIS — R2681 Unsteadiness on feet: Secondary | ICD-10-CM

## 2017-06-16 DIAGNOSIS — G8222 Paraplegia, incomplete: Secondary | ICD-10-CM

## 2017-06-16 DIAGNOSIS — M6281 Muscle weakness (generalized): Secondary | ICD-10-CM

## 2017-06-16 NOTE — Therapy (Signed)
[facility] [address] [place], [place], [address] Phone: [phone]   Fax:  [phone]  Physical Therapy Treatment  Patient Details  Name: Victor [name] MRN: [id number] Date of Birth: April 28, 1955 Referring Provider: Faith Rogue, MD   Encounter Date: 06/16/2017  PT End of Session - 06/16/17 1542    Visit Number  5    Number of Visits  21    Date for PT Re-Evaluation  08/01/17    Authorization Type  Medicaid pending-self pay until then    PT Start Time  1532    PT Stop Time  1615    PT Time Calculation (min)  43 min    Equipment Utilized During Treatment Gait belt    Activity Tolerance  Patient tolerated treatment well;Patient limited by fatigue    Behavior During Therapy  [facility] for tasks assessed/performed       Past Medical History:  Diagnosis Date  . History of kidney stones   . Hypertension   . 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 Stroke (HCC)   . Thoracic aortic aneurysm (HCC) 09/2014    Past Surgical History:  Procedure Laterality Date  . ASCENDING AORTIC ROOT REPLACEMENT N/A 01/21/2017   Procedure: Replacement of Aortic Arch with circulatory arrest;  Surgeon: [name], MD;  [facility];  Service: Open Heart Surgery;  Laterality: N/A;  . CARDIAC CATHETERIZATION    . FEMORAL-FEMORAL BYPASS GRAFT N/A 10/07/2014   Procedure: LEFT  FEMORAL ARTERY -RIGHT FEMORAL ARTERY BYPASS GRAFT USING X 30 CM HEMASHIELD GOLD GRAFT;  Surgeon: Larina Earthly, MD;  Location: [facility] OR;  Service: Vascular;  Laterality: N/A;  . RIGHT/LEFT HEART CATH AND CORONARY ANGIOGRAPHY N/A 01/11/2017   Procedure: RIGHT/LEFT HEART CATH AND CORONARY ANGIOGRAPHY- Diagnostic Only;  Surgeon: [name], MD;  Location: [facility] INVASIVE CV LAB;  Service: Cardiovascular;  Laterality: N/A;  . s/p thoracic aneurysm repair    . STERNOTOMY N/A 01/21/2017   Procedure: REDO STERNOTOMY;  Surgeon: [name], MD;  [facility] OR;   Service: Open Heart Surgery;  Laterality: N/A;  . TEE WITHOUT CARDIOVERSION N/A 01/21/2017   Procedure: TRANSESOPHAGEAL ECHOCARDIOGRAM (TEE);  Surgeon: [name], MD;  Location: [facility] OR;  Service: Open Heart Surgery;  Laterality: N/A;  . THORACIC AORTIC ANEURYSM REPAIR N/A 10/06/2014   Procedure: THORACIC ASCENDING ANEURYSM REPAIR (AAA);  Surgeon: [name], MD;  Location: [facility] OR;  Service: Open Heart Surgery;  Laterality: N/A;  hyportermia circulatory arrest and resuspension of aortic valve  . THORACIC AORTIC ENDOVASCULAR STENT GRAFT N/A 01/21/2017   Procedure: THORACIC AORTIC ENDOVASCULAR STENT GRAFT;  Surgeon: Nada Libman, MD;  [facility];  Service: Vascular;  Laterality: N/A;  . THROMBECTOMY ILIAC ARTERY Left 03/08/2017   Procedure: THROMBECTOMY of Left subclavian artery;  Surgeon: Chuck Hint, MD;  Location: [facility] OR;  Service: Vascular;  Laterality: Left;  Marland Kitchen VASCULAR SURGERY      There were no vitals filed for this visit.  Subjective Assessment - 06/17/17 1427    Subjective  No reported falls, change in medications, and no pain. Pt arrived to clinic with RW and walked into clinic for the first time using RW versus wheelchair previous sessions.         06/16/17 1711  Bed Mobility  Bed Mobility Supine to Sit;Sit to Supine  Supine to Sit 4: Min guard  Sit to Supine 4: Min guard  Transfers  Transfers Sit to Stand;Stand  to Sit;Squat Pivot Transfers  Sit to Stand 5: Supervision;With upper extremity assist  Sit to Stand Details Verbal cues for sequencing;Verbal cues for technique;Verbal cues for precautions/safety  Stand to Sit 5: Supervision;With upper extremity assist  Stand to Sit Details (indicate cue type and reason) Verbal cues for sequencing;Verbal cues for technique;Verbal cues for precautions/safety  Ambulation/Gait  Ambulation/Gait Yes  Ambulation/Gait Assistance 4: Min assist  Ambulation/Gait Assistance Details pt used RW to enter/exit session  today. cues/assist/facilitation needed for step length, weight shifting and for posture. pt continues to utilize bil hip hike to advance LE's with gait.   Ambulation Distance (Feet) 60 Feet (60 feet entering; 60 feet exiting. )  Assistive device Rolling walker  Gait Pattern Step-to pattern;Decreased stride length;Decreased hip/knee flexion - left;Decreased dorsiflexion - left;Left hip hike;Left steppage;Lateral hip instability;Decreased trunk rotation;Trunk flexed;Poor foot clearance - left  Ambulation Surface Level;Indoor  Neuro Re-ed   Neuro Re-ed Details  for strengthening/coordination/mm re-ed: Tall kneeling using red physioball for UE support: pt performed mini squats with cues/facilitation for equal LE weight bearing and cues to return to full upright posture; staying in tall kneeling: pt performed alt arm UE raises for strengthening and core stabilzation with assist for posture and balance; attempted quadruped positioning, however pt unable to perform even with cueing due to lack of core strength and UE strength. can attempt again with stability ball to abdomen for more support next session.    Knee/Hip Exercises: Supine  Bridges Strengthening;Both;1 set;10 reps  Bridges Limitations therapist assisted manual cues to keep hips in neutral rotation  Bridges with [employer] AAROM;Both;2 sets;5 reps  Bridges with Clamshell Strengthening;Both;2 sets;10 reps;Other (comment) (First set with resisted clamshell using red TB above knees)  Bridges with Clamshell Strengthening;Both;1 set;10 reps (Using red TB, maintaining hip abd tension on RTB )  Knee/Hip Exercises: Prone  Hamstring Curl 10 reps;1 set;Limitations  Hip Extension AAROM;Strengthening;Both;10 reps;Limitations  Hamstring Curl Limitations bil side with increased assistance needed on right side. cues for form and cues/assist needed to decrease compensatory movements  Hip Extension Limitations glut kick backs with assist needed for form and  technique. cues/assist needed to decrease compensatory movements        PT Short Term Goals - 06/14/17 1610      PT SHORT TERM GOAL #1   Title  Pt will initiate HEP in order to indicate improved functional mobility and decreased fall risk.  (Target Date: 07/02/17)    Time  4    Period  Weeks    Status  New    Target Date  07/02/17      PT SHORT TERM GOAL #2   Title  Will improve gait velocity by .60 ft/sec in order to indicate improved efficiency of gait and decreased fall risk.     Baseline  .78 ft/sec with RW and no AFO; .80 with RW and trial AFO    Time  4    Period  Weeks    Status  Revised    Target Date  07/02/17      PT SHORT TERM GOAL #3   Title  Pt will improve TUG to </=40 secs w/ LRAD in order to indicate decreased fall risk.      Time  4    Period  Weeks    Status  New    Target Date  07/02/17      PT SHORT TERM GOAL #4   Title  Pt will ambulate 100' w/ LRAD at S level in  order to indicate safe negotiation around home.     Time  4    Period  Days    Status  New    Target Date  07/02/17      PT SHORT TERM GOAL #5   Title  Pt will ascend/descend 3 steps with handrails at S level in order to indicate safety with home/community entry.     Time  4    Period  Weeks    Status  New    Target Date  07/02/17        PT Long Term Goals - 06/02/17 2001      PT LONG TERM GOAL #1   Title  Pt will be independent with HEP in order to indicate improved functional mobility and decreased fall risk.  (Target Date: 08/01/17)    Time  8    Period  Weeks    Status  New    Target Date  08/01/17      PT LONG TERM GOAL #2   Title  Pt will improve gait speed by 1.20 ft/sec from baseline in order to indicate decreased fall risk.     Time  8    Period  Weeks    Status  New      PT LONG TERM GOAL #3   Title  Pt will perform TUG </=30 secs w/ LRAD in order to indicate decreased fall risk.      Time  8    Period  Weeks    Status  New      PT LONG TERM GOAL #4   Title  Pt  will ambulate 300' w/ LRAD over indoor surfaces at mod I level in order to indicate safe negotation in home.     Time  8    Period  Weeks    Status  New      PT LONG TERM GOAL #5   Title  Pt will ambulate 200' w/ LRAD over paved outdoor surfaces at S level in order to indicate pt slow return to community gait.      Time  8    Period  Weeks    Status  New          Plan - 06/16/17 1647    Clinical Impression Statement  Today's PT session focused on bilateral LE strengthening in supine and core stabilization while in tall kneeling positions. Pt also able to increased tolerance in static standing. During LE strenghtening exercises in prone, pt required manual mod assist LLE and min assist RLE during glute kickers/hip extension with short lever. During tall kneeling using the red physioball, pt required pelvis tactile cues for stabilzation during squat/hinge. Pt continues to make progression towards PT goals including ambulation distance and using LRAD for mobility.     Clinical Presentation  Evolving    Clinical Decision Making  Moderate    Rehab Potential  Good    PT Frequency  2x / week    PT Duration  4 weeks    PT Treatment/Interventions  ADLs/Self Care Home Management;Electrical Stimulation;DME Instruction;Gait training;Stair training;Functional mobility training;Therapeutic activities;Therapeutic exercise;Balance training;Neuromuscular re-education;Patient/family education;Orthotic Fit/Training;Vestibular    PT Next Visit Plan  Plan to increase gait distance and working on standing endurance. Continue to focus on LE strengthening and core stabilzation exercises. Pt interested in attempting Nustep for LE strengthening.     Consulted and Agree with Plan of Care  Patient;Family member/caregiver       Patient will benefit from  skilled therapeutic intervention in order to improve the following deficits and impairments:  Abnormal gait, Cardiopulmonary status limiting activity, Decreased  activity tolerance, Decreased balance, Decreased endurance, Decreased knowledge of use of DME, Decreased mobility, Decreased strength, Impaired perceived functional ability, Impaired flexibility, Postural dysfunction, Impaired tone  Visit Diagnosis: Spinal cord infarction (HCC)  Paraplegia, incomplete (HCC)  Muscle weakness (generalized)     Problem List Patient Active Problem List   Diagnosis Date Noted  . Nerve pain 05/18/2017  . Spinal cord infarction (HCC) 03/15/2017  . Paraplegia (HCC) 03/07/2017  . Bilateral leg weakness 03/07/2017  . S/P thoracic aortic aneurysm repair 01/21/2017  . Coronary artery calcification 12/23/2016  . S/P aortic dissection repair 12/23/2016  . Thoracic aortic aneurysm without rupture (HCC) 12/23/2016  . Preoperative cardiovascular examination 12/23/2016  . Essential hypertension 09/23/2015  . Iron deficiency anemia 01/28/2015  . Weight loss 10/30/2014  . Hospital-acquired pneumonia 10/09/2014  . Ischemic leg 10/09/2014  . Aortic dissection (HCC) 10/07/2014    Dorian PodKevin Tawnie Ehresman, SPTA 06/17/2017, 4:23 PM  [facility] [address] [place], [place], [address] Phone: [phone]   Fax:  [phone]  Name: Karna ChristmasMarcus E Helm MRN: [id number] Date of Birth: Jan 19, 1956  This note has been reviewed and edited by supervising CI.  Sallyanne KusterKathy Bury, PTA, [facility] [address], [address] [place], [place] [address] [phone] 06/17/17, 10:26 PM

## 2017-06-17 ENCOUNTER — Ambulatory Visit: Payer: PRIVATE HEALTH INSURANCE | Admitting: Rehabilitation

## 2017-06-20 ENCOUNTER — Other Ambulatory Visit: Payer: Self-pay

## 2017-06-20 ENCOUNTER — Encounter: Payer: Self-pay | Admitting: Surgery

## 2017-06-20 ENCOUNTER — Ambulatory Visit (INDEPENDENT_AMBULATORY_CARE_PROVIDER_SITE_OTHER): Payer: Self-pay | Admitting: Surgery

## 2017-06-20 VITALS — BP 105/64 | HR 81 | Temp 99.4°F | Resp 18 | Ht 67.0 in | Wt 145.0 lb

## 2017-06-20 DIAGNOSIS — I712 Thoracic aortic aneurysm, without rupture, unspecified: Secondary | ICD-10-CM

## 2017-06-20 NOTE — Progress Notes (Signed)
[facility] [place]  Patient name: Victor [name] MRN: [id number] DOB: Apr 20, 1956 Sex: male   REASON FOR VISIT:    Follow up  HISOTRY OF PRESENT ILLNESS:    Victor [name] is a 62 y.o. male who initially presented in June 2015 with a type I aortic dissection.  The patient also presented with an ischemic right leg.  He underwent repair of descending aortic dissection with replacement of the ascending aorta as well as resuspension of aortic valve.  A femoral-femoral bypass graft was also performed.  He has been followed for progressive enlargement of his thoracic aorta, which now measures 5.6 cm.  On 01/21/2017 he underwent aortic arch debranching with circulatory arrest and TEVAR to treat his aneurysm.  He did well from his operation however approximately 6 weeks postop, he began having back pain in his legs became weak.  He was taken to the emergency department.  A spinal drain was placed. 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   We then spent several weeks in inpatient rehab and has regained some strength.  He tells me that he can now walk down steps by holding onto the banister.  He was able to get out of his car and walk into my office today although his legs did get weak.  He can transfer himself from the wheelchair into his bed at night. PAST MEDICAL HISTORY:   Past Medical History:  Diagnosis Date  . History of kidney stones   . Hypertension   . 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 Stroke (HCC)   . Thoracic aortic aneurysm (HCC) 09/2014     FAMILY HISTORY:   Family History  Problem Relation Age of Onset  . Heart murmur Mother     SOCIAL HISTORY:   Social History   Tobacco Use  . Smoking status: Former Smoker    Years: 35.00    Types: Cigarettes, Cigars    Last attempt to quit: 10/07/2014    Years since  quitting: 2.7  . Smokeless tobacco: Never Used  Substance Use Topics  . Alcohol use: No    Alcohol/week: 0.0 oz     ALLERGIES:   No Known Allergies   CURRENT MEDICATIONS:   Current Outpatient Medications  Medication Sig Dispense Refill  . aspirin 81 MG EC tablet Take 1 tablet (81 mg total) by mouth daily. (Patient taking differently: Take 162 mg by mouth daily. )    . bisacodyl (DULCOLAX) 10 MG suppository Place 1 suppository (10 mg total) rectally daily at 6 (six) AM. 12 suppository 0  . gabapentin (NEURONTIN) 100 MG capsule Take 1 capsule (100 mg total) by mouth 3 (three) times daily. 90 capsule 3  . HYDROcodone-acetaminophen (NORCO/VICODIN) 5-325 MG tablet Take 1 tablet by mouth every 8 (eight) hours as needed for moderate pain. 45 tablet 0  . metoprolol tartrate (LOPRESSOR) 25 MG tablet Take 0.5 tablets (12.5 mg total) by mouth 2 (two) times daily. Hold dose if BP<100/60 or pulse<50 your are symptomatic 30 tablet 2  . pantoprazole (PROTONIX) 40 MG tablet Take 1 tablet (40 mg total) by mouth daily. 30 tablet 2  . senna-docusate (SENOKOT-S) 8.6-50 MG tablet Take 2 tablets by mouth at bedtime.    . traZODone (DESYREL) 100 MG tablet Take 1 tablet (100 mg total) by mouth at bedtime. 30 tablet 2   No current facility-administered medications for this  visit.     REVIEW OF SYSTEMS:   [X]  denotes positive finding, [ ]  denotes negative finding Cardiac  Comments:  Chest pain or chest pressure:    Shortness of breath upon exertion:    Short of breath when lying flat:    Irregular heart rhythm:        Vascular    Pain in calf, thigh, or hip brought on by ambulation:    Pain in feet at night that wakes you up from your sleep:     Blood clot in your veins:    Leg swelling:         Pulmonary    Oxygen at home:    Productive cough:     Wheezing:         Neurologic    Sudden weakness in arms or legs:     Sudden numbness in arms or legs:  x   Sudden onset of difficulty speaking  or slurred speech:    Temporary loss of vision in one eye:     Problems with dizziness:         Gastrointestinal    Blood in stool:     Vomited blood:         Genitourinary    Burning when urinating:     Blood in urine:        Psychiatric    Major depression:         Hematologic    Bleeding problems:    Problems with blood clotting too easily:        Skin    Rashes or ulcers:        Constitutional    Fever or chills:      PHYSICAL EXAM:   There were no vitals filed for this visit.  GENERAL: The patient is a well-nourished male, in no acute distress. The vital signs are documented above. CARDIAC: There is a regular rate and rhythm.  VASCULAR: Palpable right radial pulse, nonpalpable left PULMONARY: Non-labored respirations MUSCULOSKELETAL: There are no major deformities or cyanosis. NEUROLOGIC: No focal weakness or paresthesias are detected. SKIN: There are no ulcers or rashes noted. PSYCHIATRIC: The patient has a normal affect.  STUDIES:   None  MEDICAL ISSUES:   TAAA:  Will order CTA c/a/p in 6 months.  I will schedule him to see Dr. Tyrone Sage after that.  He will see me in 1 year Spinal Cord ischemia:  Continue with outpatient PT.  Making significant progress    Durene Cal, MD [facility] [phone] Pager [phone]

## 2017-06-21 ENCOUNTER — Encounter: Payer: Self-pay | Admitting: Physical Therapy

## 2017-06-21 ENCOUNTER — Other Ambulatory Visit: Payer: Self-pay | Admitting: Physical Medicine & Rehabilitation

## 2017-06-21 ENCOUNTER — Ambulatory Visit: Payer: Medicaid Other | Admitting: Physical Therapy

## 2017-06-21 DIAGNOSIS — G9511 Acute infarction of spinal cord (embolic) (nonembolic): Secondary | ICD-10-CM

## 2017-06-21 DIAGNOSIS — G8222 Paraplegia, incomplete: Secondary | ICD-10-CM | POA: Diagnosis not present

## 2017-06-21 DIAGNOSIS — M792 Neuralgia and neuritis, unspecified: Secondary | ICD-10-CM

## 2017-06-21 DIAGNOSIS — M6281 Muscle weakness (generalized): Secondary | ICD-10-CM

## 2017-06-21 DIAGNOSIS — G8220 Paraplegia, unspecified: Secondary | ICD-10-CM

## 2017-06-21 DIAGNOSIS — R2689 Other abnormalities of gait and mobility: Secondary | ICD-10-CM

## 2017-06-21 DIAGNOSIS — R2681 Unsteadiness on feet: Secondary | ICD-10-CM

## 2017-06-21 MED ORDER — HYDROCODONE-ACETAMINOPHEN 5-325 MG PO TABS: 1.0000 | ORAL_TABLET | Freq: Three times a day (TID) | ORAL | 0 refills | Status: DC | PRN

## 2017-06-21 NOTE — Telephone Encounter (Signed)
[name] arrived to the office, stating he has called our office  and no one has return his call. [name] was brought back to the examination room and he states he has pain is in his lower back and lower extremities. He describes his lower extremity pain with tingling and numbness. He was instructed to continue his gabapentin as instructed.Dr. Riley KillSwartz note was reviewed, he has followed the instructions on his Hydrocodone bottle one tablet every 8 hours as needed. He was prescribed 45 tablets this is a 15 day supply, he picked up his last prescription on 06/07/2017 according to the PMP Aware Site. We will e-scibe his Hydrocodone today, he and his mother verbalizes understanding. He has a scheduled appointment with Dr Riley KillSwartz on 06/29/2017.

## 2017-06-21 NOTE — Telephone Encounter (Signed)
Patient is calling to request a refill on Oxycodone.  Please call patient.

## 2017-06-22 NOTE — Therapy (Signed)
[facility] [address] [place], [place], [address] Phone: [phone]   Fax:  [phone]  Physical Therapy Treatment  Patient Details  Name: Victor [name] MRN: [id number] Date of Birth: 05-07-1955 Referring Provider: Faith Rogue, MD   Encounter Date: 06/21/2017   06/21/17 1324  PT Visits / Re-Eval  Visit Number 6  Number of Visits 21  Date for PT Re-Evaluation 08/01/17  Authorization  Authorization Type Medicaid pending-self pay until then  PT Time Calculation  PT Start Time 1319  PT Stop Time 1405  PT Time Calculation (min) 46 min  PT - End of Session  Equipment Utilized During Treatment Gait belt  Activity Tolerance Patient tolerated treatment well;Patient limited by fatigue  Behavior During Therapy [facility] for tasks assessed/performed     Past Medical History:  Diagnosis Date  . History of kidney stones   . Hypertension   . 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 Stroke (HCC)   . Thoracic aortic aneurysm (HCC) 09/2014    Past Surgical History:  Procedure Laterality Date  . ASCENDING AORTIC ROOT REPLACEMENT N/A 01/21/2017   Procedure: Replacement of Aortic Arch with circulatory arrest;  Surgeon: [name], MD;  Location: [facility] OR;  Service: Open Heart Surgery;  Laterality: N/A;  . CARDIAC CATHETERIZATION    . FEMORAL-FEMORAL BYPASS GRAFT N/A 10/07/2014   Procedure: LEFT  FEMORAL ARTERY -RIGHT FEMORAL ARTERY BYPASS GRAFT USING X 30 CM HEMASHIELD GOLD GRAFT;  Surgeon: Larina Earthly, MD;  Location: [facility] OR;  Service: Vascular;  Laterality: N/A;  . RIGHT/LEFT HEART CATH AND CORONARY ANGIOGRAPHY N/A 01/11/2017   Procedure: RIGHT/LEFT HEART CATH AND CORONARY ANGIOGRAPHY- Diagnostic Only;  Surgeon: [name], MD;  Location: [facility] INVASIVE CV LAB;  Service: Cardiovascular;  Laterality: N/A;  . s/p thoracic aneurysm repair    . STERNOTOMY N/A 01/21/2017   Procedure: REDO STERNOTOMY;  Surgeon: [name], MD;  Location: [facility] OR;  Service: Open Heart Surgery;  Laterality: N/A;  . TEE WITHOUT CARDIOVERSION N/A 01/21/2017   Procedure: TRANSESOPHAGEAL ECHOCARDIOGRAM (TEE);  Surgeon: [name], MD;  Location: [facility] OR;  Service: Open Heart Surgery;  Laterality: N/A;  . THORACIC AORTIC ANEURYSM REPAIR N/A 10/06/2014   Procedure: THORACIC ASCENDING ANEURYSM REPAIR (AAA);  Surgeon: [name], MD;  Location: [facility] OR;  Service: Open Heart Surgery;  Laterality: N/A;  hyportermia circulatory arrest and resuspension of aortic valve  . THORACIC AORTIC ENDOVASCULAR STENT GRAFT N/A 01/21/2017   Procedure: THORACIC AORTIC ENDOVASCULAR STENT GRAFT;  Surgeon: Nada Libman, MD;  Location: [facility] OR;  Service: Vascular;  Laterality: N/A;  . THROMBECTOMY ILIAC ARTERY Left 03/08/2017   Procedure: THROMBECTOMY of Left subclavian artery;  Surgeon: Chuck Hint, MD;  Location: [facility] OR;  Service: Vascular;  Laterality: Left;  Marland Kitchen VASCULAR SURGERY      There were no vitals filed for this visit.     06/21/17 1323  Symptoms/Limitations  Subjective No new complaints. No falls.   Pertinent History Hx of aortic arch replacement and bypass to CCA and L subclavian a. 01/21/17, unsuccessful thrombectomy L bracial a on 03/08/17  Limitations House hold activities;Walking  How long can you walk comfortably? approx 30-50' with RW  Patient Stated Goals "I want to be able to walk"   Pain Assessment  Currently in Pain? Yes  Pain Score 3  Pain Location Back  Pain Orientation Lower  Pain Descriptors / Indicators Aching  Pain Type Chronic pain  Pain Onset More than a month ago  Pain Frequency Intermittent  Aggravating Factors  walking  Pain Relieving Factors lying down      06/21/17 1355  Transfers  Transfers Sit to Stand;Stand to Sit  Sit to Stand 5: Supervision;With upper extremity assist  Sit to Stand Details Verbal cues for sequencing;Verbal cues for technique;Verbal cues for precautions/safety   Sit to Stand Details (indicate cue type and reason) cues to scoot closer to edge and for more anterior weight shifitng   Stand to Sit 5: Supervision;With upper extremity assist  Stand to Sit Details (indicate cue type and reason) Verbal cues for sequencing;Verbal cues for technique;Verbal cues for precautions/safety  Stand to Sit Details cues to reach back and use arms to controll descent with sitting down  Ambulation/Gait  Ambulation/Gait Yes  Ambulation/Gait Assistance 4: Min assist;4: Min guard  Ambulation/Gait Assistance Details with RW: gait in/out of clinic with no braces on with cues for increased step length, weight shifting and increased knee/hip flexion. in session: trialed posterior ottobock brace, foot up brace and off shelf plastic AFO to left LE. Best results with plastic off shelf AFO. increased assistance needed with gait as pt fatigued.    Ambulation Distance (Feet) 20 Feet (4, 50 x2, 100 x2)  Assistive device Rolling walker  Gait Pattern Step-to pattern;Decreased stride length;Decreased hip/knee flexion - left;Decreased dorsiflexion - left;Left hip hike;Left steppage;Lateral hip instability;Decreased trunk rotation;Trunk flexed;Poor foot clearance - left  Ambulation Surface Level;Indoor  Stairs Yes  Stairs Assistance 4: Min assist  Stairs Assistance Details (indicate cue type and reason) pt was able to ascend sideways with minimal assist only to lift right foot up onto step with pt then able to lift his left foot up without assistance. pt then descended forward without any assistance to advance LE's. cues needed on technique and weight shifting.                   Stair Management Technique Step to pattern;Forwards;Sideways;One rail Left  Number of Stairs 4  Height of Stairs 6         PT Short Term Goals - 06/14/17 1610      PT SHORT TERM GOAL #1   Title  Pt will initiate HEP in order to indicate improved functional mobility and decreased fall risk.  (Target Date:  07/02/17)    Time  4    Period  Weeks    Status  New    Target Date  07/02/17      PT SHORT TERM GOAL #2   Title  Will improve gait velocity by .60 ft/sec in order to indicate improved efficiency of gait and decreased fall risk.     Baseline  .78 ft/sec with RW and no AFO; .80 with RW and trial AFO    Time  4    Period  Weeks    Status  Revised    Target Date  07/02/17      PT SHORT TERM GOAL #3   Title  Pt will improve TUG to </=40 secs w/ LRAD in order to indicate decreased fall risk.      Time  4    Period  Weeks    Status  New    Target Date  07/02/17      PT SHORT TERM GOAL #4   Title  Pt will ambulate 100' w/ LRAD at S level in order to indicate safe negotiation around home.     Time  4  Period  Days    Status  New    Target Date  07/02/17      PT SHORT TERM GOAL #5   Title  Pt will ascend/descend 3 steps with handrails at S level in order to indicate safety with home/community entry.     Time  4    Period  Weeks    Status  New    Target Date  07/02/17        PT Long Term Goals - 06/02/17 2001      PT LONG TERM GOAL #1   Title  Pt will be independent with HEP in order to indicate improved functional mobility and decreased fall risk.  (Target Date: 08/01/17)    Time  8    Period  Weeks    Status  New    Target Date  08/01/17      PT LONG TERM GOAL #2   Title  Pt will improve gait speed by 1.20 ft/sec from baseline in order to indicate decreased fall risk.     Time  8    Period  Weeks    Status  New      PT LONG TERM GOAL #3   Title  Pt will perform TUG </=30 secs w/ LRAD in order to indicate decreased fall risk.      Time  8    Period  Weeks    Status  New      PT LONG TERM GOAL #4   Title  Pt will ambulate 300' w/ LRAD over indoor surfaces at mod I level in order to indicate safe negotation in home.     Time  8    Period  Weeks    Status  New      PT LONG TERM GOAL #5   Title  Pt will ambulate 200' w/ LRAD over paved outdoor surfaces at S level in  order to indicate pt slow return to community gait.      Time  8    Period  Weeks    Status  New         06/21/17 1324  Plan  Clinical Impression Statement Today's skilled session intially focused on gait and best bracing options along side Thayer Ohm from [facility]. Was decided to persue off shelf plastic AFO that comes up high on back calf for knee control vs a carbon fiber ottobock posterior brace for left LE. For the right LE pt was given information on how to obtain a foot up brace. Remainder of session addressed stair negotiation as pt reports increased difficulty with this at home. Pt continues to present with LE weakness and imbalance. He should benefit from continued PT to progress toward unmet goals.    Pt will benefit from skilled therapeutic intervention in order to improve on the following deficits Abnormal gait;Cardiopulmonary status limiting activity;Decreased activity tolerance;Decreased balance;Decreased endurance;Decreased knowledge of use of DME;Decreased mobility;Decreased strength;Impaired perceived functional ability;Impaired flexibility;Postural dysfunction;Impaired tone  Rehab Potential Good  PT Frequency 2x / week  PT Duration 4 weeks  PT Treatment/Interventions ADLs/Self Care Home Management;Electrical Stimulation;DME Instruction;Gait training;Stair training;Functional mobility training;Therapeutic activities;Therapeutic exercise;Balance training;Neuromuscular re-education;Patient/family education;Orthotic Fit/Training;Vestibular  PT Next Visit Plan Plan to increase gait distance and working on standing endurance. Continue to focus on LE strengthening and core stabilzation exercises. Pt interested in attempting Nustep for LE strengthening.   Consulted and Agree with Plan of Care Patient;Family member/caregiver        Patient will benefit from  skilled therapeutic intervention in order to improve the following deficits and impairments:  Abnormal gait, Cardiopulmonary  status limiting activity, Decreased activity tolerance, Decreased balance, Decreased endurance, Decreased knowledge of use of DME, Decreased mobility, Decreased strength, Impaired perceived functional ability, Impaired flexibility, Postural dysfunction, Impaired tone  Visit Diagnosis: Paraplegia, incomplete (HCC)  Muscle weakness (generalized)  Unsteadiness on feet  Other abnormalities of gait and mobility     Problem List Patient Active Problem List   Diagnosis Date Noted  . Nerve pain 05/18/2017  . Spinal cord infarction (HCC) 03/15/2017  . Paraplegia (HCC) 03/07/2017  . Bilateral leg weakness 03/07/2017  . S/P thoracic aortic aneurysm repair 01/21/2017  . Coronary artery calcification 12/23/2016  . S/P aortic dissection repair 12/23/2016  . Thoracic aortic aneurysm without rupture (HCC) 12/23/2016  . Preoperative cardiovascular examination 12/23/2016  . Essential hypertension 09/23/2015  . Iron deficiency anemia 01/28/2015  . Weight loss 10/30/2014  . Hospital-acquired pneumonia 10/09/2014  . Ischemic leg 10/09/2014  . Aortic dissection (HCC) 10/07/2014    Sallyanne KusterKathy Bury, PTA, [facility] [address], [address] [place], [place] [address] [phone] 06/22/17, 10:09 PM   Name: Victor [name] MRN: [id number] Date of Birth: 01/10/1956

## 2017-06-23 ENCOUNTER — Ambulatory Visit: Payer: Medicaid Other | Admitting: Rehabilitation

## 2017-06-24 ENCOUNTER — Ambulatory Visit: Payer: PRIVATE HEALTH INSURANCE | Admitting: Rehabilitation

## 2017-06-24 DIAGNOSIS — R29898 Other symptoms and signs involving the musculoskeletal system: Secondary | ICD-10-CM | POA: Diagnosis not present

## 2017-06-24 DIAGNOSIS — Z9889 Other specified postprocedural states: Secondary | ICD-10-CM | POA: Diagnosis not present

## 2017-06-24 DIAGNOSIS — G8220 Paraplegia, unspecified: Secondary | ICD-10-CM | POA: Diagnosis not present

## 2017-06-24 DIAGNOSIS — G9511 Acute infarction of spinal cord (embolic) (nonembolic): Secondary | ICD-10-CM | POA: Diagnosis not present

## 2017-06-27 ENCOUNTER — Encounter: Payer: Self-pay | Admitting: Rehabilitation

## 2017-06-27 ENCOUNTER — Ambulatory Visit: Payer: Medicaid Other | Attending: Physical Medicine & Rehabilitation | Admitting: Rehabilitation

## 2017-06-27 DIAGNOSIS — G9511 Acute infarction of spinal cord (embolic) (nonembolic): Secondary | ICD-10-CM | POA: Insufficient documentation

## 2017-06-27 DIAGNOSIS — R293 Abnormal posture: Secondary | ICD-10-CM

## 2017-06-27 DIAGNOSIS — G8222 Paraplegia, incomplete: Secondary | ICD-10-CM | POA: Diagnosis present

## 2017-06-27 DIAGNOSIS — R2681 Unsteadiness on feet: Secondary | ICD-10-CM

## 2017-06-27 DIAGNOSIS — M6281 Muscle weakness (generalized): Secondary | ICD-10-CM | POA: Insufficient documentation

## 2017-06-27 DIAGNOSIS — R2689 Other abnormalities of gait and mobility: Secondary | ICD-10-CM | POA: Insufficient documentation

## 2017-06-27 NOTE — Therapy (Signed)
[facility] [address] [place], [place], [address] Phone: [phone]   Fax:  [phone]  Physical Therapy Treatment  Patient Details  Name: Victor ChristmasMarcus E [name] MRN: [id number] Date of Birth: 1955-05-31 Referring Provider: Faith RogueZachary Swartz, MD   Encounter Date: 06/27/2017  PT End of Session - 06/27/17 1519    Visit Number  7    Number of Visits  21    Date for PT Re-Evaluation  08/01/17    Authorization Type  Medicaid pending-self pay until then    PT Start Time  1316    PT Stop Time  1400    PT Time Calculation (min)  44 min    Equipment Utilized During Treatment  Gait belt    Activity Tolerance  Patient tolerated treatment well;Patient limited by fatigue    Behavior During Therapy  [facility] Services[facility]FL for tasks assessed/performed       Past Medical History:  Diagnosis Date  . History of kidney stones   . Hypertension   . 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 Stroke (HCC)   . Thoracic aortic aneurysm (HCC) 09/2014    Past Surgical History:  Procedure Laterality Date  . ASCENDING AORTIC ROOT REPLACEMENT N/A 01/21/2017   Procedure: Replacement of Aortic Arch with circulatory arrest;  Surgeon: Delight OvensGerhardt, Edward B, MD;  [facility];  Service: Open Heart Surgery;  Laterality: N/A;  . CARDIAC CATHETERIZATION    . FEMORAL-FEMORAL BYPASS GRAFT N/A 10/07/2014   Procedure: LEFT  FEMORAL ARTERY -RIGHT FEMORAL ARTERY BYPASS GRAFT USING 8MM X 30 CM HEMASHIELD GOLD GRAFT;  Surgeon: Larina Earthlyodd F Early, MD;  [facility];  Service: Vascular;  Laterality: N/A;  . RIGHT/LEFT HEART CATH AND CORONARY ANGIOGRAPHY N/A 01/11/2017   Procedure: RIGHT/LEFT HEART CATH AND CORONARY ANGIOGRAPHY- Diagnostic Only;  Surgeon: Tonny Bollmanooper, Michael, MD;  [facility];  Service: Cardiovascular;  Laterality: N/A;  . s/p thoracic aneurysm repair    . STERNOTOMY N/A 01/21/2017   Procedure: REDO STERNOTOMY;  Surgeon: Delight OvensGerhardt, Edward B, MD;  [facility];   Service: Open Heart Surgery;  Laterality: N/A;  . TEE WITHOUT CARDIOVERSION N/A 01/21/2017   Procedure: TRANSESOPHAGEAL ECHOCARDIOGRAM (TEE);  Surgeon: Delight OvensGerhardt, Edward B, MD;  [facility];  Service: Open Heart Surgery;  Laterality: N/A;  . THORACIC AORTIC ANEURYSM REPAIR N/A 10/06/2014   Procedure: THORACIC ASCENDING ANEURYSM REPAIR (AAA);  Surgeon: Delight OvensEdward B Gerhardt, MD;  [facility];  Service: Open Heart Surgery;  Laterality: N/A;  hyportermia circulatory arrest and resuspension of aortic valve  . THORACIC AORTIC ENDOVASCULAR STENT GRAFT N/A 01/21/2017   Procedure: THORACIC AORTIC ENDOVASCULAR STENT GRAFT;  Surgeon: Nada LibmanBrabham, Vance W, MD;  [facility];  Service: Vascular;  Laterality: N/A;  . THROMBECTOMY ILIAC ARTERY Left 03/08/2017   Procedure: THROMBECTOMY of Left subclavian artery;  Surgeon: Chuck Hintickson, Christopher S, MD;  [facility];  Service: Vascular;  Laterality: Left;  Marland Kitchen. VASCULAR SURGERY      There were no vitals filed for this visit.  Subjective Assessment - 06/27/17 1322    Subjective  Reports no changes, no falls.     Pertinent History  Hx of aortic arch replacement and bypass to CCA and L subclavian a. 01/21/17, unsuccessful thrombectomy L bracial a on 03/08/17    Limitations  House hold activities;Walking    How long can you walk comfortably?  approx 30-50' with RW    Patient Stated Goals  "I want to be able to walk"  Currently in Pain?  No/denies                      [facility] Adult PT Treatment/Exercise - 06/27/17 0001      Ambulation/Gait   Ambulation/Gait  Yes    Ambulation/Gait Assistance  5: Supervision;4: Min guard    Ambulation/Gait Assistance Details  Pt ambulated into clinic today towards mat in back of gym x 85' with single seated rest break before ambulating remainder of distance to mat.  Provided cues for posture and decreased gait compensations.  Following NMR tasks had pt ambulate again with RW.  Utilized shoe cover on LLE to provide  improved ease of LLE clearance and decrease gait compensations.  Pt did very well with marked improvement in postural control and decreased trunk rotation and L hip hike due to shoe cover. SEnt this home with pt to utilize until he gets brace.     Ambulation Distance (Feet)  85 Feet then 20' then 115'    Assistive device  Rolling walker    Gait Pattern  Step-to pattern;Decreased stride length;Decreased hip/knee flexion - left;Decreased dorsiflexion - left;Left hip hike;Left steppage;Lateral hip instability;Decreased trunk rotation;Trunk flexed;Poor foot clearance - left    Ambulation Surface  Level;Indoor      Neuro Re-ed    Neuro Re-ed Details   NMR tasks in tall kneeling/quadruped for improved proximal and core control/stabilization; In tall kneeling with BUE on Kaye bench alternating elevating UEs x 5 reps>elevating both UEs slightly off mat with 5-10 sec holds x 5 reps progressing to both UEs off mat but elevating one into full shoulder flex x 5 reps on each side>lateral weight shifting while reaching with light UE support and tactile cues from PT for full weight shift x 5 reps each side.  With BUE support, had pt side step in tall kneeling x 2 laps each way (on therapy mat) with emphasis on slow controlled movements and upright posture.  Attempted quadruped over red therapy ball.  This continues to be difficult, but he was able to maintain to perform alternating UE extension x 10 reps with facilitation at core for improved posterior pelvic tilt.  Pt needing several rest breaks between tasks and needing to get into sitting x 3 reps.               PT Education - 06/27/17 1518    Education provided  Yes    Education Details  using shoe cover on L shoe to decrease gait compensations when ambulating at home.     Person(s) Educated  Patient;Parent(s)    Methods  Explanation    Comprehension  Verbalized understanding       PT Short Term Goals - 06/14/17 0832      PT SHORT TERM GOAL #1    Title  Pt will initiate HEP in order to indicate improved functional mobility and decreased fall risk.  (Target Date: 07/02/17)    Time  4    Period  Weeks    Status  New    Target Date  07/02/17      PT SHORT TERM GOAL #2   Title  Will improve gait velocity by .60 ft/sec in order to indicate improved efficiency of gait and decreased fall risk.     Baseline  .78 ft/sec with RW and no AFO; .80 with RW and trial AFO    Time  4    Period  Weeks    Status  Revised  Target Date  07/02/17      PT SHORT TERM GOAL #3   Title  Pt will improve TUG to </=40 secs w/ LRAD in order to indicate decreased fall risk.      Time  4    Period  Weeks    Status  New    Target Date  07/02/17      PT SHORT TERM GOAL #4   Title  Pt will ambulate 100' w/ LRAD at S level in order to indicate safe negotiation around home.     Time  4    Period  Days    Status  New    Target Date  07/02/17      PT SHORT TERM GOAL #5   Title  Pt will ascend/descend 3 steps with handrails at S level in order to indicate safety with home/community entry.     Time  4    Period  Weeks    Status  New    Target Date  07/02/17        PT Long Term Goals - 06/02/17 2001      PT LONG TERM GOAL #1   Title  Pt will be independent with HEP in order to indicate improved functional mobility and decreased fall risk.  (Target Date: 08/01/17)    Time  8    Period  Weeks    Status  New    Target Date  08/01/17      PT LONG TERM GOAL #2   Title  Pt will improve gait speed by 1.20 ft/sec from baseline in order to indicate decreased fall risk.     Time  8    Period  Weeks    Status  New      PT LONG TERM GOAL #3   Title  Pt will perform TUG </=30 secs w/ LRAD in order to indicate decreased fall risk.      Time  8    Period  Weeks    Status  New      PT LONG TERM GOAL #4   Title  Pt will ambulate 300' w/ LRAD over indoor surfaces at mod I level in order to indicate safe negotation in home.     Time  8    Period  Weeks     Status  New      PT LONG TERM GOAL #5   Title  Pt will ambulate 200' w/ LRAD over paved outdoor surfaces at S level in order to indicate pt slow return to community gait.      Time  8    Period  Weeks    Status  New            Plan - 06/27/17 1520    Clinical Impression Statement  Skilled session focused on NMR in tall kneeling and quadruped for improved proximal LE control and core activation.  Also continue to address gait with RW.  Added L shoe cover with gait to promote more normal gait pattern and decrease gait deviations.  Pt did markedly better with cover, therefore sent home with him to use until he gets brace.     Rehab Potential  Good    PT Frequency  2x / week    PT Duration  4 weeks    PT Treatment/Interventions  ADLs/Self Care Home Management;Electrical Stimulation;DME Instruction;Gait training;Stair training;Functional mobility training;Therapeutic activities;Therapeutic exercise;Balance training;Neuromuscular re-education;Patient/family education;Orthotic Fit/Training;Vestibular    PT Next Visit Plan  Plan  to increase gait distance and working on standing endurance. Continue to focus on LE strengthening and core stabilzation exercises. Pt interested in attempting Nustep for LE strengthening.     Recommended Other Services  check with Thayer Ohm about brace as he is supposed to bring in [place] approval at next visit.     Consulted and Agree with Plan of Care  Patient;Family member/caregiver       Patient will benefit from skilled therapeutic intervention in order to improve the following deficits and impairments:  Abnormal gait, Cardiopulmonary status limiting activity, Decreased activity tolerance, Decreased balance, Decreased endurance, Decreased knowledge of use of DME, Decreased mobility, Decreased strength, Impaired perceived functional ability, Impaired flexibility, Postural dysfunction, Impaired tone  Visit Diagnosis: Muscle weakness (generalized)  Unsteadiness on  feet  Other abnormalities of gait and mobility  Abnormal posture     Problem List Patient Active Problem List   Diagnosis Date Noted  . Nerve pain 05/18/2017  . Spinal cord infarction (HCC) 03/15/2017  . Paraplegia (HCC) 03/07/2017  . Bilateral leg weakness 03/07/2017  . S/P thoracic aortic aneurysm repair 01/21/2017  . Coronary artery calcification 12/23/2016  . S/P aortic dissection repair 12/23/2016  . Thoracic aortic aneurysm without rupture (HCC) 12/23/2016  . Preoperative cardiovascular examination 12/23/2016  . Essential hypertension 09/23/2015  . Iron deficiency anemia 01/28/2015  . Weight loss 10/30/2014  . Hospital-acquired pneumonia 10/09/2014  . Ischemic leg 10/09/2014  . Aortic dissection (HCC) 10/07/2014    Harriet Butte, PT, MPT [facility] [address] [place], [place], [address] Phone: [phone]   Fax:  [phone] 06/27/17, 3:24 PM  Name: COVE HAYDON MRN: [id number] Date of Birth: February 10, 1956

## 2017-06-29 ENCOUNTER — Encounter: Payer: Self-pay | Admitting: Physical Medicine & Rehabilitation

## 2017-06-29 ENCOUNTER — Encounter: Payer: Medicaid Other | Attending: Registered Nurse | Admitting: Physical Medicine & Rehabilitation

## 2017-06-29 VITALS — BP 138/87 | HR 97

## 2017-06-29 DIAGNOSIS — M792 Neuralgia and neuritis, unspecified: Secondary | ICD-10-CM | POA: Insufficient documentation

## 2017-06-29 DIAGNOSIS — K4090 Unilateral inguinal hernia, without obstruction or gangrene, not specified as recurrent: Secondary | ICD-10-CM | POA: Diagnosis not present

## 2017-06-29 DIAGNOSIS — G9511 Acute infarction of spinal cord (embolic) (nonembolic): Secondary | ICD-10-CM | POA: Diagnosis not present

## 2017-06-29 DIAGNOSIS — G8220 Paraplegia, unspecified: Secondary | ICD-10-CM

## 2017-06-29 DIAGNOSIS — I10 Essential (primary) hypertension: Secondary | ICD-10-CM | POA: Diagnosis not present

## 2017-06-29 MED ORDER — METOPROLOL TARTRATE 25 MG PO TABS
12.5000 mg | ORAL_TABLET | Freq: Two times a day (BID) | ORAL | 4 refills | Status: DC
Start: 2017-06-29 — End: 2018-09-06

## 2017-06-29 NOTE — Progress Notes (Addendum)
Subjective:    Patient ID: Victor [name], male    DOB: 1956/03/06, 62 y.o.   MRN: [id number]  HPI   Victor [name] is here in follow-up of his thoracic spinal cord injury.  He continues to make progress with therapy.  Is using a walker for gait.  Denies any falls or problems.  He states his pain is  improved.  He is using hydrocodone sparingly at home typically only on occasion at night.  He remains on low-dose gabapentin which has been beneficial also.  He states that he is ready now to have his left inguinal hernia assessed and would like a referral.  He asked me today regarding discrepancies in his blood pressure.  He seen drops of systolic pressure more than 40 units from right to left side.  He denies any problems with the left hand.  Denies numbness tingling change in temperature or appearance.  He is moving his bowels on his own every 3-4 days.  He is no longer using any laxatives or suppositories.  He states that he is moving his bladder without too many difficulties.  Pain Inventory Average Pain 0 Pain Right Now 0 My pain is aching  In the last 24 hours, has pain interfered with the following? General activity 10 Relation with others 10 Enjoyment of life 7 What TIME of day is your pain at its worst? night Sleep (in general) Poor  Pain is worse with: na Pain improves with: na Relief from Meds: 10  Mobility use a walker  Function I need assistance with the following:  bathing  Neuro/Psych No problems in this area  Prior Studies Any changes since last visit?  no  Physicians involved in your care Any changes since last visit?  no   Family History  Problem Relation Age of Onset  . Heart murmur Mother    Social History   Socioeconomic History  . Marital status: Single    Spouse name: None  . Number of children: None  . Years of education: None  . Highest education level: None  Social Needs  . Financial resource strain: None  . Food insecurity - worry: None   . Food insecurity - inability: None  . Transportation needs - medical: None  . Transportation needs - non-medical: None  Occupational History  . None  Tobacco Use  . Smoking status: Former Smoker    Years: 35.00    Types: Cigarettes, Cigars    Last attempt to quit: 10/07/2014    Years since quitting: 2.7  . Smokeless tobacco: Never Used  Substance and Sexual Activity  . Alcohol use: No    Alcohol/week: 0.0 oz  . Drug use: No  . Sexual activity: None  Other Topics Concern  . None  Social History Narrative  . None   Past Surgical History:  Procedure Laterality Date  . ASCENDING AORTIC ROOT REPLACEMENT N/A 01/21/2017   Procedure: Replacement of Aortic Arch with circulatory arrest;  Surgeon: Delight OvensGerhardt, Edward B, MD;  [facility];  Service: Open Heart Surgery;  Laterality: N/A;  . CARDIAC CATHETERIZATION    . FEMORAL-FEMORAL BYPASS GRAFT N/A 10/07/2014   Procedure: LEFT  FEMORAL ARTERY -RIGHT FEMORAL ARTERY BYPASS GRAFT USING 8MM X 30 CM HEMASHIELD GOLD GRAFT;  Surgeon: Larina Earthlyodd F Early, MD;  [facility];  Service: Vascular;  Laterality: N/A;  . RIGHT/LEFT HEART CATH AND CORONARY ANGIOGRAPHY N/A 01/11/2017   Procedure: RIGHT/LEFT HEART CATH AND CORONARY ANGIOGRAPHY- Diagnostic Only;  Surgeon: Tonny Bollmanooper, Michael,  MD;  [facility] LAB;  Service: Cardiovascular;  Laterality: N/A;  . s/p thoracic aneurysm repair    . STERNOTOMY N/A 01/21/2017   Procedure: REDO STERNOTOMY;  Surgeon: [name], MD;  Location: [facility] OR;  Service: Open Heart Surgery;  Laterality: N/A;  . TEE WITHOUT CARDIOVERSION N/A 01/21/2017   Procedure: TRANSESOPHAGEAL ECHOCARDIOGRAM (TEE);  Surgeon: [name], MD;  [facility];  Service: Open Heart Surgery;  Laterality: N/A;  . THORACIC AORTIC ANEURYSM REPAIR N/A 10/06/2014   Procedure: THORACIC ASCENDING ANEURYSM REPAIR (AAA);  Surgeon: [name], MD;  [facility];  Service: Open Heart Surgery;  Laterality: N/A;  hyportermia  circulatory arrest and resuspension of aortic valve  . THORACIC AORTIC ENDOVASCULAR STENT GRAFT N/A 01/21/2017   Procedure: THORACIC AORTIC ENDOVASCULAR STENT GRAFT;  Surgeon: Nada Libman, MD;  Location[facility] OR;  Service: Vascular;  Laterality: N/A;  . THROMBECTOMY ILIAC ARTERY Left 03/08/2017   Procedure: THROMBECTOMY of Left subclavian artery;  Surgeon: Chuck Hint, MD;  Location: [facility] OR;  Service: Vascular;  Laterality: Left;  Marland Kitchen VASCULAR SURGERY     Past Medical History:  Diagnosis Date  . History of kidney stones   . Hypertension   . 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 Stroke (HCC)   . Thoracic aortic aneurysm (HCC) 09/2014   BP 138/87 (BP Location: Right Arm, Patient Position: Sitting, Cuff Size: Normal)   Pulse 97   SpO2 97% , left arm: 100/66  Opioid Risk Score:   Fall Risk Score:  `1  Depression screen PHQ 2/9  Depression screen PHQ 2/9 05/18/2017  Decreased Interest 0  Down, Depressed, Hopeless 0  PHQ - 2 Score 0     Review of Systems  Constitutional: Negative.   HENT: Negative.   Eyes: Negative.   Respiratory: Negative.   Cardiovascular: Negative.   Gastrointestinal: Negative.   Endocrine: Negative.   Genitourinary: Negative.   Musculoskeletal: Negative.   Skin: Negative.   Allergic/Immunologic: Negative.   Neurological: Negative.   Hematological: Negative.   Psychiatric/Behavioral: Negative.   All other systems reviewed and are negative.      Objective:   Physical Exam  Constitutional: He appearswell-developedand well-nourished.No distress.  HENT:  Head:Normocephalic.  Eyes:EOMare normal.  Neck:Normal range of motion.Neck supple.No thyromegalypresent.  Cardiovascular:RRR Respiratory: CTA Bilaterally without wheezes or rales. Normal effort   ZO:XWRU.Bowel sounds are normal. He exhibitsno distension.  Genitourinary: Scrotal/hernia present to the left Skin. Intact  neurological. Cognitively appropriate.  No gross nystagmus seen with confrontation. UE 5/5 bilaterally.Right hip flexion is 3 out of 5, knee extension 3 out of 5. Right ankle dorsiflexion and plantar flexion are 3 to 3+ out of 5.  Left hf, ke are 2 to 2+/5, ADF/PF 2, sensory decreased to LT/pain below level of injury. 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 Status post thrombectomy of left brachial artery, unsuccessful despite multiple attempts by vascular surgery 111/06/2016 as well as recent aortic arch replacement followed byTEVARprocedure 01/21/2017 -continue with outpt therapies   -he's making gradual progress.    -he would benefit from an AFO for his ongoing ankle weakness in the LLE.  2.   Pain Management:Hydrocodone as needed--  #45 of 5/325. No RF needed. Using sparingly          -  CSA signed -continue gabapentin 100 tid for nerve pain 3. BP discrepancy: due to left brachial thrombus---limb appears normal, bp variance to be expected. He can discuss with VVS at next visit   -take bp in right arm 4. Mood:follow for depression. No treatment right now although he has his moments. Asked patient to call me if mood worsens in any way. He seems to be pretty up beat at present 5 Neurogenic bowel and bladder.           -off all meds          -  suppository or enema, try dig stim first on day 3 without BM          -double voids working 6.  Inguinal hernia: Large, inhibiting at times. Scrotal sling ineffective. -will make referral to surgery for assessment  25 minutes spent today with the patient.  Greater than 50% of this dealt with his blood pressure issue and discussion regarding causes behind the variance in researching his history.

## 2017-06-30 ENCOUNTER — Ambulatory Visit: Payer: Medicaid Other | Admitting: Physical Therapy

## 2017-07-01 ENCOUNTER — Ambulatory Visit: Payer: PRIVATE HEALTH INSURANCE | Admitting: Rehabilitation

## 2017-07-01 ENCOUNTER — Telehealth: Payer: Self-pay

## 2017-07-01 NOTE — Telephone Encounter (Signed)
Made verbal contact with Dr. Riley KillSwartz, he stated that the patient still needs to come in for the UDS and no script until the results from that test are complete.

## 2017-07-01 NOTE — Telephone Encounter (Signed)
He's obviously not using them "just at night" ---he did not tell me the truth at his OV. Is he completely out? If so, he's using more than 4 per day. As it's written if he was rx'ed #45 on 2/26, he should have at least #12 left!!!!   He may have another rx only after a UDS is completed and the results are verified. .Marland Kitchen

## 2017-07-01 NOTE — Telephone Encounter (Signed)
Patient called today requesting a refill of hydrocodone-apap 5/325mg .   Last note on 06-29-2017 states:  2. Pain Management:Hydrocodone as needed--  #45 of 5/325. No RF needed. Using sparingly -CSA signed  Last refill of this medication was 06-21-2017 by Riley LamEunice for 45 tabs of this medication as well  Please advise

## 2017-07-01 NOTE — Addendum Note (Signed)
Addended by: Barbee ShropshireBRIGHT, BRUCE B on: 07/01/2017 01:15 PM   Modules accepted: Orders

## 2017-07-01 NOTE — Telephone Encounter (Signed)
Called patient back, asked him how many of the medication does he have left.  He responded with that he has only 6 left and that he takes "1-2 a day sometimes 3 and then stated that sometimes he has to take more".   I informed him that he should not take the medication in that manor and take it only as it was prescribed by the doctor.  I then informed that he needs to come in for a UDS as ordered by the doctor and then we can give him another prescription once the results of that have returned.  He then loudly stated "what I got to take that for?"  I then explained to him that this is a test that the doctor wants you to take in order to insure that the medication will be present in the urine.  He then asked when he needs to do this and I informed him that it would be best to come in as soon as he can.   At that time he said something barely audibly and hung up the phone before I could finish the explanation.

## 2017-07-04 ENCOUNTER — Telehealth: Payer: Self-pay | Admitting: Rehabilitation

## 2017-07-04 ENCOUNTER — Ambulatory Visit: Payer: Medicaid Other | Admitting: Rehabilitation

## 2017-07-04 ENCOUNTER — Encounter: Payer: Self-pay | Admitting: Rehabilitation

## 2017-07-04 DIAGNOSIS — R2681 Unsteadiness on feet: Secondary | ICD-10-CM

## 2017-07-04 DIAGNOSIS — R293 Abnormal posture: Secondary | ICD-10-CM

## 2017-07-04 DIAGNOSIS — M6281 Muscle weakness (generalized): Secondary | ICD-10-CM

## 2017-07-04 DIAGNOSIS — R2689 Other abnormalities of gait and mobility: Secondary | ICD-10-CM

## 2017-07-04 DIAGNOSIS — G8222 Paraplegia, incomplete: Secondary | ICD-10-CM

## 2017-07-04 NOTE — Telephone Encounter (Signed)
I addended last week's note to state that he would benefit from an AFO.  Thanks!

## 2017-07-04 NOTE — Addendum Note (Signed)
Addended by: Barbee ShropshireBRIGHT, Tiera Mensinger B on: 07/04/2017 02:33 PM   Modules accepted: Orders

## 2017-07-04 NOTE — Therapy (Signed)
[facility] [address] [place], [place], [address] Phone: [phone]   Fax:  [phone]  Physical Therapy Treatment  Patient Details  Name: Victor [name] MRN: [id number] Date of Birth: Mar 15, 1956 Referring Provider: Alger Simons, MD   Encounter Date: 07/04/2017  PT End of Session - 07/04/17 1500    Visit Number  8    Number of Visits  21    Date for PT Re-Evaluation  08/01/17    Authorization Type  Medicaid (awaiting approval)    PT Start Time  1316    PT Stop Time  1402    PT Time Calculation (min)  46 min    Equipment Utilized During Treatment  Gait belt    Activity Tolerance  Patient tolerated treatment well;Patient limited by fatigue    Behavior During Therapy  [facility] for tasks assessed/performed       Past Medical History:  Diagnosis Date  . History of kidney stones   . Hypertension   . Spinal cord infarction ([place]) 02/2017  . Stroke ([place])   . Thoracic aortic aneurysm ([place]) 09/2014    Past Surgical History:  Procedure Laterality Date  . ASCENDING AORTIC ROOT REPLACEMENT N/A 01/21/2017   Procedure: Replacement of Aortic Arch with circulatory arrest;  Surgeon: Grace Isaac, MD;  Location: [facility];  Service: Open Heart Surgery;  Laterality: N/A;  . CARDIAC CATHETERIZATION    . FEMORAL-FEMORAL BYPASS GRAFT N/A 10/07/2014   Procedure: LEFT  FEMORAL ARTERY -RIGHT FEMORAL ARTERY BYPASS GRAFT USING 8MM X 30 CM HEMASHIELD GOLD GRAFT;  Surgeon: Rosetta Posner, MD;  Location: [facility];  Service: Vascular;  Laterality: N/A;  . RIGHT/LEFT HEART CATH AND CORONARY ANGIOGRAPHY N/A 01/11/2017   Procedure: RIGHT/LEFT HEART CATH AND CORONARY ANGIOGRAPHY- Diagnostic Only;  Surgeon: Sherren Mocha, MD;  Location: [facility];  Service: Cardiovascular;  Laterality: N/A;  . s/p thoracic aneurysm repair    . STERNOTOMY N/A 01/21/2017   Procedure: REDO STERNOTOMY;  Surgeon: Grace Isaac, MD;  Location: [facility];   Service: Open Heart Surgery;  Laterality: N/A;  . TEE WITHOUT CARDIOVERSION N/A 01/21/2017   Procedure: TRANSESOPHAGEAL ECHOCARDIOGRAM (TEE);  Surgeon: Grace Isaac, MD;  Location: [facility];  Service: Open Heart Surgery;  Laterality: N/A;  . THORACIC AORTIC ANEURYSM REPAIR N/A 10/06/2014   Procedure: THORACIC ASCENDING ANEURYSM REPAIR (AAA);  Surgeon: Grace Isaac, MD;  Location: [facility];  Service: Open Heart Surgery;  Laterality: N/A;  hyportermia circulatory arrest and resuspension of aortic valve  . THORACIC AORTIC ENDOVASCULAR STENT GRAFT N/A 01/21/2017   Procedure: THORACIC AORTIC ENDOVASCULAR STENT GRAFT;  Surgeon: Serafina Mitchell, MD;  Location: [facility];  Service: Vascular;  Laterality: N/A;  . THROMBECTOMY ILIAC ARTERY Left 03/08/2017   Procedure: THROMBECTOMY of Left subclavian artery;  Surgeon: Angelia Mould, MD;  Location: [facility];  Service: Vascular;  Laterality: Left;  [facility] VASCULAR SURGERY      There were no vitals filed for this visit.  Subjective Assessment - 07/04/17 1325    Subjective  Pt reports no changes, feeling better since last week, no falls.  Note that within session, he reports he is not doing exercises consistently and is not walking very much at home.      Pertinent History  Hx of aortic arch replacement and bypass to CCA and L subclavian a. 01/21/17, unsuccessful thrombectomy L bracial a on 03/08/17    Limitations  House hold activities;Walking  How long can you walk comfortably?  approx 30-50' with RW    Patient Stated Goals  "I want to be able to walk"     Currently in Pain?  No/denies                      [facility] Adult PT Treatment/Exercise - 07/04/17 0001      Ambulation/Gait   Ambulation/Gait  Yes    Ambulation/Gait Assistance  5: Supervision    Ambulation/Gait Assistance Details  Performed gait with use of off the shelf plastic L AFO and R foot up brace during session to assess STGs and to continue to work on quality of gait.  Pt  ambulatory in clinic today with RW as well.  Provided cues for upright posture, increased L knee flexion with gait (esp when wearing AFO).  Tolerated L AFO, however due to not accurate fit, he c/o of some pain and tightness.       Ambulation Distance (Feet)  100 Feet 65' x 2 reps, 70' x 1    Assistive device  Rolling walker L plastic AFO (posterior) and R foot up brace    Gait Pattern  Step-to pattern;Decreased stride length;Decreased hip/knee flexion - left;Decreased dorsiflexion - left;Left hip hike;Left steppage;Lateral hip instability;Decreased trunk rotation;Trunk flexed;Poor foot clearance - left    Ambulation Surface  Level;Indoor    Gait velocity  .75 ft/sec with L plastic posterior AFO and R foot up brace with RW    Stairs  Yes    Stairs Assistance  4: Min assist    Stairs Assistance Details (indicate cue type and reason)  Pt needs assist to ascend for RLE placement on step, however is able to descend at S level.     Stair Management Technique  One rail Left;Step to pattern;Sideways    Number of Stairs  4    Height of Stairs  6      Standardized Balance Assessment   Standardized Balance Assessment  Timed Up and Go Test      Timed Up and Go Test   TUG  Normal TUG    Normal TUG (seconds)  37 w/ RW, L AFO and R foot up brace      Neuro Re-ed    Neuro Re-ed Details   Quickly went through supine NMR exercises from HEP.  Note that pt requires cues for all exercises, but is making progress.  Emphasized that he needs to continue these at home.              PT Education - 07/04/17 1500    Education provided  Yes    Education Details  purpose of foot up brace    Person(s) Educated  Patient    Methods  Explanation;Demonstration    Comprehension  Verbalized understanding;Need further instruction;Tactile cues required;Verbal cues required       PT Short Term Goals - 07/04/17 1339      PT SHORT TERM GOAL #1   Title  Pt will initiate HEP in order to indicate improved functional  mobility and decreased fall risk.  (Target Date: 07/02/17)    Baseline  met, has initiated but not consistently performing    Time  4    Period  Weeks    Status  Achieved      PT SHORT TERM GOAL #2   Title  Will improve gait velocity by .60 ft/sec in order to indicate improved efficiency of gait and decreased fall risk.  Baseline  .89 ft/sec with RW and no AFO; .80 with RW and trial AFO; .75 ft/sec with RW plastic AFO on LLE, foot up brace on RLE    Time  4    Period  Weeks    Status  Not Met      PT SHORT TERM GOAL #3   Title  Pt will improve TUG to </=40 secs w/ LRAD in order to indicate decreased fall risk.      Baseline  37 secs on 07/04/17    Time  4    Period  Weeks    Status  Achieved      PT SHORT TERM GOAL #4   Title  Pt will ambulate 100' w/ LRAD at S level in order to indicate safe negotiation around home.     Baseline  met 07/04/17    Time  4    Period  Days    Status  Achieved      PT SHORT TERM GOAL #5   Title  Pt will ascend/descend 3 steps with handrails at S level in order to indicate safety with home/community entry.     Baseline  needs min A to ascend, S to descend    Time  4    Period  Weeks    Status  Partially Met        PT Long Term Goals - 06/02/17 2001      PT LONG TERM GOAL #1   Title  Pt will be independent with HEP in order to indicate improved functional mobility and decreased fall risk.  (Target Date: 08/01/17)    Time  8    Period  Weeks    Status  New    Target Date  08/01/17      PT LONG TERM GOAL #2   Title  Pt will improve gait speed by 1.20 ft/sec from baseline in order to indicate decreased fall risk.     Time  8    Period  Weeks    Status  New      PT LONG TERM GOAL #3   Title  Pt will perform TUG </=30 secs w/ LRAD in order to indicate decreased fall risk.      Time  8    Period  Weeks    Status  New      PT LONG TERM GOAL #4   Title  Pt will ambulate 300' w/ LRAD over indoor surfaces at mod I level in order to indicate  safe negotation in home.     Time  8    Period  Weeks    Status  New      PT LONG TERM GOAL #5   Title  Pt will ambulate 200' w/ LRAD over paved outdoor surfaces at S level in order to indicate pt slow return to community gait.      Time  8    Period  Weeks    Status  New            Plan - 07/04/17 1501    Clinical Impression Statement  skilled session focused on STG assessment.  Note that pt has made progress with gait speed in relation to TUG, however gait speed in straight path is still consistent with baseline.  He is not consistent with HEP per verbal report and needed cues to recall exercises during session.  He has partially met stair goal as he needs min A to ascend, but can  descend with S.  Therefore has met 3/5 STGs, partially meeting stair goal.  Continue to feel that he will benefit from L LE AFO and R foot up brace.  PT was able to provide pt with R foot up brace due to clinic having one donated.  Pt now has medicaid, therefore will submit to Medicaid for visits.     Rehab Potential  Good    PT Frequency  1x / week then 2x/wk for 4 more week    PT Duration  3 weeks then 2x/wk for 4 more weeks    PT Treatment/Interventions  ADLs/Self Care Home Management;Electrical Stimulation;DME Instruction;Gait training;Stair training;Functional mobility training;Therapeutic activities;Therapeutic exercise;Balance training;Neuromuscular re-education;Patient/family education;Orthotic Fit/Training;Vestibular    PT Next Visit Plan  Provide more education on foot up brace (I hope he brings it-show mom how to help don if needed) Plan to increase gait distance and working on standing endurance. Continue to focus on LE strengthening and core stabilzation exercises. Pt interested in attempting Nustep for LE strengthening.     Recommended Other Services  I sent note to MD to add face to face portion in note-see if this happened, I will call [facility] and see if we can get different brace than originally  planned.     Consulted and Agree with Plan of Care  Patient;Family member/caregiver       Patient will benefit from skilled therapeutic intervention in order to improve the following deficits and impairments:  Abnormal gait, Cardiopulmonary status limiting activity, Decreased activity tolerance, Decreased balance, Decreased endurance, Decreased knowledge of use of DME, Decreased mobility, Decreased strength, Impaired perceived functional ability, Impaired flexibility, Postural dysfunction, Impaired tone  Visit Diagnosis: Muscle weakness (generalized)  Unsteadiness on feet  Other abnormalities of gait and mobility  Abnormal posture  Paraplegia, incomplete ([place])     Problem List Patient Active Problem List   Diagnosis Date Noted  . Unilateral inguinal hernia without obstruction or gangrene 06/29/2017  . Nerve pain 05/18/2017  . Spinal cord infarction ([place]) 03/15/2017  . Paraplegia ([place]) 03/07/2017  . Bilateral leg weakness 03/07/2017  . S/P thoracic aortic aneurysm repair 01/21/2017  . Coronary artery calcification 12/23/2016  . S/P aortic dissection repair 12/23/2016  . Thoracic aortic aneurysm without rupture ([place]) 12/23/2016  . Preoperative cardiovascular examination 12/23/2016  . Hypertension 09/23/2015  . Iron deficiency anemia 01/28/2015  . Weight loss 10/30/2014  . Hospital-acquired pneumonia 10/09/2014  . Ischemic leg 10/09/2014  . Aortic dissection ([place]) 10/07/2014    Cameron Sprang, PT, MPT [facility] [address] [place], [place], [address] Phone: [phone]   Fax:  [phone] 07/04/17, 3:19 PM  Name: Victor [name] MRN: [id number] Date of Birth: 1955-10-20

## 2017-07-04 NOTE — Telephone Encounter (Signed)
Hey Dr. Riley KillSwartz,   I know you already wrote us an order for Victor [name] to get an AFO.  He now as Medicaid and can file his brace through them.  I just need the last note from you to say he will benefit from brace (face to face needed for Medicaid).  He states he is coming to see you today, maybe it could be added to that note?  I'm sorry, I would have sent this earlier, but he just got medicaid approval..    Thanks,  Harriet ButteEmily Jaleil Renwick, PT, MPT [facility] [address] [place], [place], [address] Phone: [phone]   Fax:  [phone] 07/04/17, 1:28 PM

## 2017-07-07 ENCOUNTER — Encounter: Payer: Self-pay | Admitting: Rehabilitation

## 2017-07-07 ENCOUNTER — Ambulatory Visit: Payer: Medicaid Other | Admitting: Rehabilitation

## 2017-07-07 DIAGNOSIS — R2689 Other abnormalities of gait and mobility: Secondary | ICD-10-CM

## 2017-07-07 DIAGNOSIS — R2681 Unsteadiness on feet: Secondary | ICD-10-CM

## 2017-07-07 DIAGNOSIS — G8222 Paraplegia, incomplete: Secondary | ICD-10-CM

## 2017-07-07 DIAGNOSIS — R293 Abnormal posture: Secondary | ICD-10-CM

## 2017-07-07 DIAGNOSIS — M6281 Muscle weakness (generalized): Secondary | ICD-10-CM

## 2017-07-07 NOTE — Patient Instructions (Signed)
Heel Slides    Squeeze pelvic floor and hold. Slide left heel along bed towards bottom. Hold for _2-3__ seconds. Slide back to flat knee position. Repeat _10__ times. Do _1-2__ times a day. Repeat with other leg.  Use strap if needed to help your leg bend, but use as much muscle strength as you can.  Also have family assist as needed to make sure knee stays in line with body.    Copyright  VHI. All rights reserved.    Bridging    Slowly raise buttocks from floor, keeping stomach tight. Repeat _10___ times per set. Do __1__ sets per session. Do __2__ sessions per day.  Use your hands lightly to hold legs in place, but fight to keep legs in line with shoulders as much as you can with the muscles in your leg.    http://orth.exer.us/1096   Copyright  VHI. All rights reserved.    Quad Set    With other leg bent, foot flat, slowly tighten muscles on thigh of straight leg while counting out loud to _3_. Repeat with other leg.  Or you can do both legs at the same time.   Repeat __10__ times. Do __1-2__ sessions per day.  http://gt2.exer.us/275   Copyright  VHI. All rights reserved.   Internal Rotation: ROM In Extension (Supine)    You do not need anyone to assist.  Lying on your back with legs straight.  Let feet fall out to the side and you work on bringing your feet back up pointing to ceiling.  Repeat x 10 reps.    Copyright  VHI. All rights reserved.

## 2017-07-07 NOTE — Therapy (Signed)
Victor [name] [address] Victor [name], [place], [address] Phone: [phone]   Fax:  [phone]  Physical Therapy Treatment  Patient Details  Name: Victor [name] MRN: [id number] Date of Birth: 1956-03-12 Referring Provider: Alger Simons, MD   Encounter Date: 07/07/2017  Victor [name] End of Session - 07/07/17 1507    Visit Number  9    Number of Visits  21    Date for Victor [name] Re-Evaluation  08/01/17    Authorization Type  Medicaid (awaiting approval)    Victor [name] Start Time  1403    Victor [name] Stop Time  1448    Victor [name] Time Calculation (min)  45 min    Equipment Utilized During Treatment  Gait belt    Activity Tolerance  Patient tolerated treatment well;Patient limited by fatigue    Behavior During Therapy  [facility] for tasks assessed/performed       Past Medical History:  Diagnosis Date  . History of kidney stones   . Hypertension   . Spinal cord infarction ([place]) 02/2017  . Stroke ([place])   . Thoracic aortic aneurysm ([place]) 09/2014    Past Surgical History:  Procedure Laterality Date  . ASCENDING AORTIC ROOT REPLACEMENT N/A 01/21/2017   Procedure: Replacement of Aortic Arch with circulatory arrest;  Surgeon: Grace Isaac, MD;  Location: [place];  Service: Open Heart Surgery;  Laterality: N/A;  . CARDIAC CATHETERIZATION    . FEMORAL-FEMORAL BYPASS GRAFT N/A 10/07/2014   Procedure: LEFT  FEMORAL ARTERY -RIGHT FEMORAL ARTERY BYPASS GRAFT USING 8MM X 30 CM HEMASHIELD GOLD GRAFT;  Surgeon: Rosetta Posner, MD;  Location: [place];  Service: Vascular;  Laterality: N/A;  . RIGHT/LEFT HEART CATH AND CORONARY ANGIOGRAPHY N/A 01/11/2017   Procedure: RIGHT/LEFT HEART CATH AND CORONARY ANGIOGRAPHY- Diagnostic Only;  Surgeon: Sherren Mocha, MD;  Location: [place] [facility];  Service: Cardiovascular;  Laterality: N/A;  . s/p thoracic aneurysm repair    . STERNOTOMY N/A 01/21/2017   Procedure: REDO STERNOTOMY;  Surgeon: Grace Isaac, MD;  Location: [place];   Service: Open Heart Surgery;  Laterality: N/A;  . TEE WITHOUT CARDIOVERSION N/A 01/21/2017   Procedure: TRANSESOPHAGEAL ECHOCARDIOGRAM (TEE);  Surgeon: Grace Isaac, MD;  Location: [place];  Service: Open Heart Surgery;  Laterality: N/A;  . THORACIC AORTIC ANEURYSM REPAIR N/A 10/06/2014   Procedure: THORACIC ASCENDING ANEURYSM REPAIR (AAA);  Surgeon: Grace Isaac, MD;  Location: [place];  Service: Open Heart Surgery;  Laterality: N/A;  hyportermia circulatory arrest and resuspension of aortic valve  . THORACIC AORTIC ENDOVASCULAR STENT GRAFT N/A 01/21/2017   Procedure: THORACIC AORTIC ENDOVASCULAR STENT GRAFT;  Surgeon: Serafina Mitchell, MD;  Location: [place];  Service: Vascular;  Laterality: N/A;  . THROMBECTOMY ILIAC ARTERY Left 03/08/2017   Procedure: THROMBECTOMY of Left subclavian artery;  Surgeon: Angelia Mould, MD;  Location: [place];  Service: Vascular;  Laterality: Left;  Marland Kitchen VASCULAR SURGERY      There were no vitals filed for this visit.  Subjective Assessment - 07/07/17 1421    Subjective  Victor [name] present today with foot up brace, he had tried to take it out of shoe, therefore it was not in shoe properly.     Pertinent History  Hx of aortic arch replacement and bypass to CCA and L subclavian a. 01/21/17, unsuccessful thrombectomy L bracial a on 03/08/17    Limitations  House hold activities;Walking    How long can you walk comfortably?  approx 30-50' with  RW    Patient Stated Goals  "I want to be able to walk"     Currently in Pain?  No/denies           Self Care:  Discussed foot up brace with Victor [name] and mother, how to don/doff, etc.  Provided education on [facility], HEP, importance of performing HEP 2x/daily and trying to get back to normal schedule in which he sleeps at night and is up during the day as well as how to better regulate BP while trying to remain in upright postures.  Both verbalized understanding.    NMR:  Went through current HEP again per Victor [name] request  and provided handout again for improved compliance at home. See Victor [name] instruction.    TE:  Nustep x 3 mins with LEs only at level 2 resistance with cues for maintaining LE alignment.  Victor [name] very interested in this equipment and educated that [employer] center has this equipment.  Victor [name] and mother verbalized understanding.                    Victor [name] Education - 07/07/17 1506    Education provided  Yes    Education Details  Provided education on proper use/placement of foot up brace, provided education on HEP, trying to get back on normal sleep/day schedule, information on [employer] center.     Person(s) Educated  Patient;Parent(s)    Methods  Explanation;Demonstration;Handout    Comprehension  Verbalized understanding;Returned demonstration       Victor [name] Short Term Goals - 07/04/17 1339      Victor [name] SHORT TERM GOAL #1   Title  Victor [name] will initiate HEP in order to indicate improved functional mobility and decreased fall risk.  (Target Date: 07/02/17)    Baseline  met, has initiated but not consistently performing    Time  4    Period  Weeks    Status  Achieved      Victor [name] SHORT TERM GOAL #2   Title  Will improve gait velocity by .60 ft/sec in order to indicate improved efficiency of gait and decreased fall risk.     Baseline  .74 ft/sec with RW and no AFO; .80 with RW and trial AFO; .75 ft/sec with RW plastic AFO on LLE, foot up brace on RLE    Time  4    Period  Weeks    Status  Not Met      Victor [name] SHORT TERM GOAL #3   Title  Victor [name] will improve TUG to </=40 secs w/ LRAD in order to indicate decreased fall risk.      Baseline  37 secs on 07/04/17    Time  4    Period  Weeks    Status  Achieved      Victor [name] SHORT TERM GOAL #4   Title  Victor [name] will ambulate 100' w/ LRAD at S level in order to indicate safe negotiation around home.     Baseline  met 07/04/17    Time  4    Period  Days    Status  Achieved      Victor [name] SHORT TERM GOAL #5   Title  Victor [name] will ascend/descend 3 steps with handrails at S level in order to  indicate safety with home/community entry.     Baseline  needs min A to ascend, S to descend    Time  4    Period  Weeks    Status  Partially Met  Victor [name] Long Term Goals - 06/02/17 2001      Victor [name] LONG TERM GOAL #1   Title  Victor [name] will be independent with HEP in order to indicate improved functional mobility and decreased fall risk.  (Target Date: 08/01/17)    Baseline  dependent     Time  8    Period  Weeks    Status  New    Target Date  08/01/17      Victor [name] LONG TERM GOAL #2   Title  Victor [name] will improve gait speed by 1.20 ft/sec from baseline in order to indicate decreased fall risk.     Baseline  .75 ft/sec at baseline with RW, no AFO    Time  8    Period  Weeks    Status  New      Victor [name] LONG TERM GOAL #3   Title  Victor [name] will perform TUG </=30 secs w/ LRAD in order to indicate decreased fall risk.      Baseline  51.16 secs with RW at baseline    Time  8    Period  Weeks    Status  New      Victor [name] LONG TERM GOAL #4   Title  Victor [name] will ambulate 300' w/ LRAD over indoor surfaces at mod I level in order to indicate safe negotation in home.     Baseline  40' w/ RW at min A level     Time  8    Period  Weeks    Status  New      Victor [name] LONG TERM GOAL #5   Title  Victor [name] will ambulate 200' w/ LRAD over paved outdoor surfaces at S level in order to indicate Victor [name] slow return to community gait.      Baseline  unable to assess outdoor gait on eval due to time constraint and safety concerns.     Time  8    Period  Weeks    Status  New            Plan - 07/07/17 1508    Clinical Impression Statement  Skilled session focused on going on proper use and placement of foot up brace for RLE as Victor [name] presented today not utilizing correctly.  Encouraged Victor [name] to get appropriate lace up tennis shoes for foot up brace and future AFO to provide best support.  Victor [name] and mother verbalized understanding.  Provided encouragement to complete HEP at least 2x daily and provided handout on [facility] center so that Victor [name] may use nustep  equipment for strength/endurance.  Also provided education on importance of getting back on normal sleep/awake schedule as he is awake most of the night and sleeps or lays around most of the day.  Victor [name] also verbalizing that he gets dizzy and then lays down (again for most of day).  Educated on BP regulation and feel his body is unable to do this because it can never adapt to upright posture, Victor [name] verbalized understanding.     Rehab Potential  Good    Victor [name] Frequency  1x / week then 2x/wk for 4 more week    Victor [name] Duration  3 weeks then 2x/wk for 4 more weeks    Victor [name] Treatment/Interventions  ADLs/Self Care Home Management;Electrical Stimulation;DME Instruction;Gait training;Stair training;Functional mobility training;Therapeutic activities;Therapeutic exercise;Balance training;Neuromuscular re-education;Patient/family education;Orthotic Fit/Training;Vestibular    Victor [name] Next Visit Plan  Follow up on [facility] (he said he may go by following previous session), continue to encourage sleep/wake times, Plan  to increase gait distance and working on standing endurance. Continue to focus on LE strengthening and core stabilzation exercises. Victor [name] interested in attempting Nustep for LE strengthening.     Consulted and Agree with Plan of Care  Patient;Family member/caregiver       Patient will benefit from skilled therapeutic intervention in order to improve the following deficits and impairments:  Abnormal gait, Cardiopulmonary status limiting activity, Decreased activity tolerance, Decreased balance, Decreased endurance, Decreased knowledge of use of DME, Decreased mobility, Decreased strength, Impaired perceived functional ability, Impaired flexibility, Postural dysfunction, Impaired tone  Visit Diagnosis: Muscle weakness (generalized)  Unsteadiness on feet  Other abnormalities of gait and mobility  Abnormal posture  Paraplegia, incomplete ([place])     Problem List Patient Active Problem List   Diagnosis Date  Noted  . Unilateral inguinal hernia without obstruction or gangrene 06/29/2017  . Nerve pain 05/18/2017  . Spinal cord infarction ([place]) 03/15/2017  . Paraplegia ([place]) 03/07/2017  . Bilateral leg weakness 03/07/2017  . S/P thoracic aortic aneurysm repair 01/21/2017  . Coronary artery calcification 12/23/2016  . S/P aortic dissection repair 12/23/2016  . Thoracic aortic aneurysm without rupture ([place]) 12/23/2016  . Preoperative cardiovascular examination 12/23/2016  . Hypertension 09/23/2015  . Iron deficiency anemia 01/28/2015  . Weight loss 10/30/2014  . Hospital-acquired pneumonia 10/09/2014  . Ischemic leg 10/09/2014  . Aortic dissection ([place]) 10/07/2014    Victor [name], Victor [name], Victor [name] [facility] [address] [place], [place], [address] Phone: [phone]   Fax:  [phone] 07/07/17, 3:17 PM  Name: KIRKLAND FIGG MRN: [id number] Date of Birth: 23-Apr-1956

## 2017-07-08 LAB — TOXASSURE SELECT,+ANTIDEPR,UR

## 2017-07-09 ENCOUNTER — Telehealth: Payer: Self-pay | Admitting: Physical Medicine & Rehabilitation

## 2017-07-09 NOTE — Telephone Encounter (Signed)
He has oxycodone in his UDS in addition to the prescribed hydrocodone. Where's he getting this from? I'm not inclined to write this man any more narcotics unless there's a good explanation for this.

## 2017-07-11 ENCOUNTER — Ambulatory Visit: Payer: Medicaid Other | Admitting: Physical Therapy

## 2017-07-11 ENCOUNTER — Ambulatory Visit: Payer: Medicaid Other | Admitting: Rehabilitation

## 2017-07-11 DIAGNOSIS — M6281 Muscle weakness (generalized): Secondary | ICD-10-CM

## 2017-07-11 DIAGNOSIS — R2689 Other abnormalities of gait and mobility: Secondary | ICD-10-CM

## 2017-07-11 NOTE — Telephone Encounter (Addendum)
According to PMP Aware [name] 10/325 #90 on 05/04/17.  You wrote for hydrocodone 5/325 on 05/18/17 and the controlled substance agreement was signed 05/18/17.  He should not still be taking oxycodone from January in March and this is a violation. Plus, Bruce told him on 07/01/17 (Friday) he had to come in for a UDS. It was done on 07/04/17(Monday) which should not have been allowed when collecting urine drug screens since he had the entire weekend to "prep for test". Therefore, this drugscreen is inconsistent but really not acceptable since he was warned ahead of time.

## 2017-07-12 ENCOUNTER — Encounter: Payer: Self-pay | Admitting: Physical Therapy

## 2017-07-12 NOTE — Therapy (Signed)
[facility] [address] [place], [place], [address] Phone: [phone]   Fax:  [phone]  Physical Therapy Treatment  Patient Details  Name: Victor [name] MRN: [id number] Date of Birth: 24-Mar-1956 Referring Provider: Alger Simons, MD   Encounter Date: 07/11/2017  PT End of Session - 07/12/17 1146    Visit Number  10    Number of Visits  21 12 visits approved by Medicaid beginning 07-14-17    Date for PT Re-Evaluation  08/01/17    Authorization Type  Medicaid    Authorization Time Period  3-21 - 08-24-17    Authorization - Number of Visits  12    PT Start Time  1152    PT Stop Time  1236    PT Time Calculation (min)  44 min       Past Medical History:  Diagnosis Date  . History of kidney stones   . Hypertension   . Spinal cord infarction ([place]) 02/2017  . Stroke ([place])   . Thoracic aortic aneurysm ([place]) 09/2014    Past Surgical History:  Procedure Laterality Date  . ASCENDING AORTIC ROOT REPLACEMENT N/A 01/21/2017   Procedure: Replacement of Aortic Arch with circulatory arrest;  Surgeon: Grace Isaac, MD;  Location: [place];  Service: Open Heart Surgery;  Laterality: N/A;  . CARDIAC CATHETERIZATION    . FEMORAL-FEMORAL BYPASS GRAFT N/A 10/07/2014   Procedure: LEFT  FEMORAL ARTERY -RIGHT FEMORAL ARTERY BYPASS GRAFT USING 8MM X 30 CM HEMASHIELD GOLD GRAFT;  Surgeon: Rosetta Posner, MD;  Location: [place];  Service: Vascular;  Laterality: N/A;  . RIGHT/LEFT HEART CATH AND CORONARY ANGIOGRAPHY N/A 01/11/2017   Procedure: RIGHT/LEFT HEART CATH AND CORONARY ANGIOGRAPHY- Diagnostic Only;  Surgeon: Sherren Mocha, MD;  Location: [place] [facility];  Service: Cardiovascular;  Laterality: N/A;  . s/p thoracic aneurysm repair    . STERNOTOMY N/A 01/21/2017   Procedure: REDO STERNOTOMY;  Surgeon: Grace Isaac, MD;  Location: [place];  Service: Open Heart Surgery;  Laterality: N/A;  . TEE WITHOUT CARDIOVERSION N/A  01/21/2017   Procedure: TRANSESOPHAGEAL ECHOCARDIOGRAM (TEE);  Surgeon: Grace Isaac, MD;  Location: [place];  Service: Open Heart Surgery;  Laterality: N/A;  . THORACIC AORTIC ANEURYSM REPAIR N/A 10/06/2014   Procedure: THORACIC ASCENDING ANEURYSM REPAIR (AAA);  Surgeon: Grace Isaac, MD;  Location: [place];  Service: Open Heart Surgery;  Laterality: N/A;  hyportermia circulatory arrest and resuspension of aortic valve  . THORACIC AORTIC ENDOVASCULAR STENT GRAFT N/A 01/21/2017   Procedure: THORACIC AORTIC ENDOVASCULAR STENT GRAFT;  Surgeon: Serafina Mitchell, MD;  Location: [place];  Service: Vascular;  Laterality: N/A;  . THROMBECTOMY ILIAC ARTERY Left 03/08/2017   Procedure: THROMBECTOMY of Left subclavian artery;  Surgeon: Angelia Mould, MD;  Location: [place];  Service: Vascular;  Laterality: Left;  Marland Kitchen VASCULAR SURGERY      There were no vitals filed for this visit.  Subjective Assessment - 07/12/17 1118    Subjective  Pt requests that I check the foot up brace - thinks it is in correctly but not sure    Pertinent History  Hx of aortic arch replacement and bypass to CCA and L subclavian a. 01/21/17, unsuccessful thrombectomy L bracial a on 03/08/17    Patient Stated Goals  "I want to be able to walk"     Currently in Pain?  No/denies  [place] Adult PT Treatment/Exercise - 07/12/17 0001      Transfers   Transfers  Sit to Stand    Sit to Stand  5: Supervision with RUE assist    Sit to Stand Details  Verbal cues for technique;Verbal cues for sequencing    Stand to Sit  5: Supervision    Stand to Sit Details (indicate cue type and reason)  -- with pt reaching back  with RUE      Ambulation/Gait   Ambulation/Gait  Yes    Ambulation/Gait Assistance  5: Supervision    Ambulation/Gait Assistance Details  Pt wearing foot up brace on RLE    Ambulation Distance (Feet)  50 Feet    Assistive device  Rolling walker L plastic AFO (posterior) and R foot  up brace    Gait Pattern  Step-to pattern;Decreased dorsiflexion - right;Decreased dorsiflexion - left;Decreased hip/knee flexion - right;Decreased hip/knee flexion - left    Ambulation Surface  Level;Indoor      Knee/Hip Exercises: Aerobic   Nustep  Level 3 x 5" with UE's and LE's      Knee/Hip Exercises: Machines for Strengthening   Cybex Leg Press  40# bil. LE's 3 sets 10 reps      Knee/Hip Exercises: Supine   Heel Slides  AAROM;Left;1 set;10 reps    Bridges  AROM;Both;1 set;10 reps    Bridges with Clamshell  Strengthening;Both;1 set;5 reps    Straight Leg Raises  AAROM;Both;1 set;10 reps    Other Supine Knee/Hip Exercises  unilateral bridging x 10 reps each leg    Other Supine Knee/Hip Exercises  hip abduction/ adduction in hooklying position x 10 reps with moderate manual resistance with abduction;  assistance with adduction      Knee/Hip Exercises: Prone   Hamstring Curl  10 reps;1 set    Other Prone Exercises  Pt performed hip extension prone with flexed knee 10 reps with assistance for full ROM       Pt performed tall kneeling - green physioball placed in front for UE support prn:  Alternating UE flexion for core stabilization Moving each leg out to side/back in for trunk stabilization and LE strengthening - 10 reps with each leg with CGA for safety due to decr. balance         PT Short Term Goals - 07/04/17 1339      PT SHORT TERM GOAL #1   Title  Pt will initiate HEP in order to indicate improved functional mobility and decreased fall risk.  (Target Date: 07/02/17)    Baseline  met, has initiated but not consistently performing    Time  4    Period  Weeks    Status  Achieved      PT SHORT TERM GOAL #2   Title  Will improve gait velocity by .60 ft/sec in order to indicate improved efficiency of gait and decreased fall risk.     Baseline  .27 ft/sec with RW and no AFO; .80 with RW and trial AFO; .75 ft/sec with RW plastic AFO on LLE, foot up brace on RLE    Time  4     Period  Weeks    Status  Not Met      PT SHORT TERM GOAL #3   Title  Pt will improve TUG to </=40 secs w/ LRAD in order to indicate decreased fall risk.      Baseline  37 secs on 07/04/17    Time  4  Period  Weeks    Status  Achieved      PT SHORT TERM GOAL #4   Title  Pt will ambulate 100' w/ LRAD at S level in order to indicate safe negotiation around home.     Baseline  met 07/04/17    Time  4    Period  Days    Status  Achieved      PT SHORT TERM GOAL #5   Title  Pt will ascend/descend 3 steps with handrails at S level in order to indicate safety with home/community entry.     Baseline  needs min A to ascend, S to descend    Time  4    Period  Weeks    Status  Partially Met        PT Long Term Goals - 06/02/17 2001      PT LONG TERM GOAL #1   Title  Pt will be independent with HEP in order to indicate improved functional mobility and decreased fall risk.  (Target Date: 08/01/17)    Baseline  dependent     Time  8    Period  Weeks    Status  New    Target Date  08/01/17      PT LONG TERM GOAL #2   Title  Pt will improve gait speed by 1.20 ft/sec from baseline in order to indicate decreased fall risk.     Baseline  .75 ft/sec at baseline with RW, no AFO    Time  8    Period  Weeks    Status  New      PT LONG TERM GOAL #3   Title  Pt will perform TUG </=30 secs w/ LRAD in order to indicate decreased fall risk.      Baseline  51.16 secs with RW at baseline    Time  8    Period  Weeks    Status  New      PT LONG TERM GOAL #4   Title  Pt will ambulate 300' w/ LRAD over indoor surfaces at mod I level in order to indicate safe negotation in home.     Baseline  40' w/ RW at min A level     Time  8    Period  Weeks    Status  New      PT LONG TERM GOAL #5   Title  Pt will ambulate 200' w/ LRAD over paved outdoor surfaces at S level in order to indicate pt slow return to community gait.      Baseline  unable to assess outdoor gait on eval due to time constraint  and safety concerns.     Time  8    Period  Weeks    Status  New            Plan - 07/12/17 1148    Clinical Impression Statement  Pt wearing Foot up brace in Rt shoe, but did not appear to be signficantly improving Rt foot clearance in swing phase of gait; laced correctly in shoe that he was wearing but pt would benefit from different shoe; pt has bilateral foot drop and would benefit from bil. AFO's rather that foot up brace on RLE.  (Pt received Medicaid authorization beginning 07-14-17. Pt presents with bil. LE weakness with > hip musc. weakness in LLE than RLE.      Rehab Potential  Good    PT Frequency  1x / week  PT Duration  3 weeks    PT Treatment/Interventions  ADLs/Self Care Home Management;Electrical Stimulation;DME Instruction;Gait training;Stair training;Functional mobility training;Therapeutic activities;Therapeutic exercise;Balance training;Neuromuscular re-education;Patient/family education;Orthotic Fit/Training;Vestibular    PT Next Visit Plan  Pursue obtaining bil. AFO's as pt has Medicaid (12 visits) starting 07-14-17;  cont bil. LE strengthening: pt had not followed up on [facility] as of 07-11-17    Consulted and Agree with Plan of Care  Patient;Family member/caregiver       Patient will benefit from skilled therapeutic intervention in order to improve the following deficits and impairments:  Abnormal gait, Cardiopulmonary status limiting activity, Decreased activity tolerance, Decreased balance, Decreased endurance, Decreased knowledge of use of DME, Decreased mobility, Decreased strength, Impaired perceived functional ability, Impaired flexibility, Postural dysfunction, Impaired tone  Visit Diagnosis: Muscle weakness (generalized)  Other abnormalities of gait and mobility     Problem List Patient Active Problem List   Diagnosis Date Noted  . Unilateral inguinal hernia without obstruction or gangrene 06/29/2017  . Nerve pain 05/18/2017  . Spinal cord  infarction ([place]) 03/15/2017  . Paraplegia ([place]) 03/07/2017  . Bilateral leg weakness 03/07/2017  . S/P thoracic aortic aneurysm repair 01/21/2017  . Coronary artery calcification 12/23/2016  . S/P aortic dissection repair 12/23/2016  . Thoracic aortic aneurysm without rupture ([place]) 12/23/2016  . Preoperative cardiovascular examination 12/23/2016  . Hypertension 09/23/2015  . Iron deficiency anemia 01/28/2015  . Weight loss 10/30/2014  . Hospital-acquired pneumonia 10/09/2014  . Ischemic leg 10/09/2014  . Aortic dissection ([place]) 10/07/2014    Yuktha Kerchner, [place], PT 07/12/2017, 11:57 AM  [facility] [address] [place], [place], [address] Phone: [phone]   Fax:  [phone]  Name: Victor [name] MRN: [id number] Date of Birth: 07/07/1955

## 2017-07-12 NOTE — Telephone Encounter (Signed)
Given he mislead me on how he took his hydrocodone and that he was also taking oxycodone from another provider, I can no longer write him narcotics. I would be willing to continue seeing him for his other spinal cord injury needs if he chooses

## 2017-07-12 NOTE — Telephone Encounter (Signed)
I have left a message for Mr Victor [name] to call our office, and have mailed a letter informing him of non- narcotic treatment only. Contract will be cancelled and notation [facility] chart [facility] narcotic [facility]. There doesn't appear to be a follow up appt scheduled.

## 2017-07-13 ENCOUNTER — Telehealth: Payer: Self-pay | Admitting: Physical Medicine & Rehabilitation

## 2017-07-13 NOTE — Telephone Encounter (Signed)
Patient returned call to our office 

## 2017-07-14 ENCOUNTER — Ambulatory Visit: Payer: Medicaid Other | Admitting: Rehabilitation

## 2017-07-14 ENCOUNTER — Telehealth: Payer: Self-pay | Admitting: Rehabilitation

## 2017-07-14 ENCOUNTER — Encounter: Payer: Self-pay | Admitting: Rehabilitation

## 2017-07-14 DIAGNOSIS — G9511 Acute infarction of spinal cord (embolic) (nonembolic): Secondary | ICD-10-CM

## 2017-07-14 DIAGNOSIS — R2681 Unsteadiness on feet: Secondary | ICD-10-CM

## 2017-07-14 DIAGNOSIS — G8222 Paraplegia, incomplete: Secondary | ICD-10-CM

## 2017-07-14 DIAGNOSIS — M6281 Muscle weakness (generalized): Secondary | ICD-10-CM

## 2017-07-14 DIAGNOSIS — R293 Abnormal posture: Secondary | ICD-10-CM

## 2017-07-14 DIAGNOSIS — R2689 Other abnormalities of gait and mobility: Secondary | ICD-10-CM

## 2017-07-14 NOTE — Therapy (Signed)
[facility] [name] [place], [place], [address] Phone: [phone]   Fax:  [phone]  Physical Therapy Treatment  Patient Details  Name: Victor [name] MRN: [id number] Date of Birth: March 18, 1956 Referring Provider: Alger Simons, MD   Encounter Date: 07/14/2017  PT End of Session - 07/14/17 1548    Visit Number  12 updated due to error on last viist    Number of Visits  21 12 visits approved by Medicaid beginning 07-14-17    Date for PT Re-Evaluation  08/01/17    Authorization Type  Medicaid (12 approved from 3/21-08/24/17    Authorization Time Period  3-21 - 08-24-17    Authorization - Visit Number  1    Authorization - Number of Visits  12    PT Start Time  1316    PT Stop Time  1400    PT Time Calculation (min)  44 min    Activity Tolerance  Patient tolerated treatment well;Patient limited by fatigue    Behavior During Therapy  [facility] for tasks assessed/performed       Past Medical History:  Diagnosis Date  . History of kidney stones   . Hypertension   . Spinal cord infarction ([place]) 02/2017  . Stroke ([place])   . Thoracic aortic aneurysm ([place]) 09/2014    Past Surgical History:  Procedure Laterality Date  . ASCENDING AORTIC ROOT REPLACEMENT N/A 01/21/2017   Procedure: Replacement of Aortic Arch with circulatory arrest;  Surgeon: Grace Isaac, MD;  Location: [facility];  Service: Open Heart Surgery;  Laterality: N/A;  . CARDIAC CATHETERIZATION    . FEMORAL-FEMORAL BYPASS GRAFT N/A 10/07/2014   Procedure: LEFT  FEMORAL ARTERY -RIGHT FEMORAL ARTERY BYPASS GRAFT USING 8MM X 30 CM HEMASHIELD GOLD GRAFT;  Surgeon: Rosetta Posner, MD;  Location: [facility];  Service: Vascular;  Laterality: N/A;  . RIGHT/LEFT HEART CATH AND CORONARY ANGIOGRAPHY N/A 01/11/2017   Procedure: RIGHT/LEFT HEART CATH AND CORONARY ANGIOGRAPHY- Diagnostic Only;  Surgeon: Sherren Mocha, MD;  Location: [facility];  Service: Cardiovascular;   Laterality: N/A;  . s/p thoracic aneurysm repair    . STERNOTOMY N/A 01/21/2017   Procedure: REDO STERNOTOMY;  Surgeon: Grace Isaac, MD;  Location: [facility];  Service: Open Heart Surgery;  Laterality: N/A;  . TEE WITHOUT CARDIOVERSION N/A 01/21/2017   Procedure: TRANSESOPHAGEAL ECHOCARDIOGRAM (TEE);  Surgeon: Grace Isaac, MD;  Location: [facility];  Service: Open Heart Surgery;  Laterality: N/A;  . THORACIC AORTIC ANEURYSM REPAIR N/A 10/06/2014   Procedure: THORACIC ASCENDING ANEURYSM REPAIR (AAA);  Surgeon: Grace Isaac, MD;  Location: [facility];  Service: Open Heart Surgery;  Laterality: N/A;  hyportermia circulatory arrest and resuspension of aortic valve  . THORACIC AORTIC ENDOVASCULAR STENT GRAFT N/A 01/21/2017   Procedure: THORACIC AORTIC ENDOVASCULAR STENT GRAFT;  Surgeon: Serafina Mitchell, MD;  Location: [facility];  Service: Vascular;  Laterality: N/A;  . THROMBECTOMY ILIAC ARTERY Left 03/08/2017   Procedure: THROMBECTOMY of Left subclavian artery;  Surgeon: Angelia Mould, MD;  Location: [facility];  Service: Vascular;  Laterality: Left;  [facility] VASCULAR SURGERY      There were no vitals filed for this visit.  Subjective Assessment - 07/14/17 1321    Subjective  Pt reports he is doing more walking at home.  He does report he is walking at times without RW, holding onto wall.     Patient is accompained by:  Family member  Pertinent History  Hx of aortic arch replacement and bypass to CCA and L subclavian a. 01/21/17, unsuccessful thrombectomy L bracial a on 03/08/17    How long can you walk comfortably?  approx 30-50' with RW    Patient Stated Goals  "I want to be able to walk"     Currently in Pain?  No/denies                      [facility] Adult PT Treatment/Exercise - 07/14/17 1346      Ambulation/Gait   Ambulation/Gait  Yes    Ambulation/Gait Assistance  5: Supervision;4: Min assist;3: Mod assist    Ambulation/Gait Assistance Details  Pt ambulatory into clinic with  RW at S level with cues for posture and improved L hip and knee flexion.  Trialed use of gait with PLS AFO on RLE and plastic PLS AFO on LLE with RW at S level with marked improvement noted in R foot clearance.  Pt able to ambulate full lap in gym (which he has never been able to do before) therefore note improved efficiency with use of B AFOs.  Pt to ask for order and call [employer] to add to previous order.  Still recommend that he still work on getting better shoe wear for brace so that they may work properly.  Pts mother states that he should be getting some new ones next week.  Will continue to follow up with this.  Pt reports that he is walking some at home in hallway without RW but using walls on either side of him.  PT educated on safety concern with this and importance of progressing with RW>LRAD.  Therefore during session had pt ambulate with quad tip cane and HHA on LUE with PT assist.  Pt able to ambulate at moderate A fading to min A with cues for increased R LE weight bearing and weight shift to the R to avoid increased pressure with LUE on PT.      Ambulation Distance (Feet)  100 Feet 115, 40' x 2    Assistive device  Rolling walker SPC with quad attachment.     Gait Pattern  Step-to pattern;Decreased dorsiflexion - right;Decreased dorsiflexion - left;Decreased hip/knee flexion - right;Decreased hip/knee flexion - left    Ambulation Surface  Level;Indoor      Self-Care   Self-Care  Other Self-Care Comments    Other Self-Care Comments   Continue to educate on importance of pt being out of bed more and more each day to allow pts BP to better regulate as he is still staying in bed most of day and when he feels dizzy he is laying back in bed.  Encouraged him to recline when dizzy rather than laying down.  Educated on safety with gait at home, see gait section.       Neuro Re-ed    Neuro Re-ed Details   Performed seated exercise on large physioball to improve trunk/core activation with cues for  upright posture working on lateral hip bumps x 10 reps.  Attempted small marches however he is only able to elevate heels.  Performed this x 10 reps total as able.               PT Education - 07/14/17 1547    Education provided  Yes    Education Details  see self care    Person(s) Educated  Patient    Methods  Explanation    Comprehension  Verbalized understanding  PT Short Term Goals - 07/04/17 1339      PT SHORT TERM GOAL #1   Title  Pt will initiate HEP in order to indicate improved functional mobility and decreased fall risk.  (Target Date: 07/02/17)    Baseline  met, has initiated but not consistently performing    Time  4    Period  Weeks    Status  Achieved      PT SHORT TERM GOAL #2   Title  Will improve gait velocity by .60 ft/sec in order to indicate improved efficiency of gait and decreased fall risk.     Baseline  .10 ft/sec with RW and no AFO; .80 with RW and trial AFO; .75 ft/sec with RW plastic AFO on LLE, foot up brace on RLE    Time  4    Period  Weeks    Status  Not Met      PT SHORT TERM GOAL #3   Title  Pt will improve TUG to </=40 secs w/ LRAD in order to indicate decreased fall risk.      Baseline  37 secs on 07/04/17    Time  4    Period  Weeks    Status  Achieved      PT SHORT TERM GOAL #4   Title  Pt will ambulate 100' w/ LRAD at S level in order to indicate safe negotiation around home.     Baseline  met 07/04/17    Time  4    Period  Days    Status  Achieved      PT SHORT TERM GOAL #5   Title  Pt will ascend/descend 3 steps with handrails at S level in order to indicate safety with home/community entry.     Baseline  needs min A to ascend, S to descend    Time  4    Period  Weeks    Status  Partially Met        PT Long Term Goals - 06/02/17 2001      PT LONG TERM GOAL #1   Title  Pt will be independent with HEP in order to indicate improved functional mobility and decreased fall risk.  (Target Date: 08/01/17)    Baseline   dependent     Time  8    Period  Weeks    Status  New    Target Date  08/01/17      PT LONG TERM GOAL #2   Title  Pt will improve gait speed by 1.20 ft/sec from baseline in order to indicate decreased fall risk.     Baseline  .75 ft/sec at baseline with RW, no AFO    Time  8    Period  Weeks    Status  New      PT LONG TERM GOAL #3   Title  Pt will perform TUG </=30 secs w/ LRAD in order to indicate decreased fall risk.      Baseline  51.16 secs with RW at baseline    Time  8    Period  Weeks    Status  New      PT LONG TERM GOAL #4   Title  Pt will ambulate 300' w/ LRAD over indoor surfaces at mod I level in order to indicate safe negotation in home.     Baseline  40' w/ RW at min A level     Time  8    Period  Weeks    Status  New      PT LONG TERM GOAL #5   Title  Pt will ambulate 200' w/ LRAD over paved outdoor surfaces at S level in order to indicate pt slow return to community gait.      Baseline  unable to assess outdoor gait on eval due to time constraint and safety concerns.     Time  8    Period  Weeks    Status  New            Plan - 07/14/17 1549    Clinical Impression Statement  Skilled session assessed gait with use of R PLS AFO (carbon fiber walk on) and L plastic AFO with RW and with quad tip cane.  Feel that he gets better support with use of PLS, therefore will plan to send note to MD to get R PLS AFO as well as L AFO.  Pt and mother verbalize understanding.  Also continue to work on [employer] for improved trunk control.      Rehab Potential  Good    PT Frequency  1x / week    PT Duration  3 weeks    PT Treatment/Interventions  ADLs/Self Care Home Management;Electrical Stimulation;DME Instruction;Gait training;Stair training;Functional mobility training;Therapeutic activities;Therapeutic exercise;Balance training;Neuromuscular re-education;Patient/family education;Orthotic Fit/Training;Vestibular    PT Next Visit Plan  Pursue obtaining bil. AFO's as pt has  Medicaid (12 visits) starting 07-14-17;  cont bil. LE strengthening: pt had not followed up on [facility] as of 07-11-17    Consulted and Agree with Plan of Care  Patient;Family member/caregiver       Patient will benefit from skilled therapeutic intervention in order to improve the following deficits and impairments:  Abnormal gait, Cardiopulmonary status limiting activity, Decreased activity tolerance, Decreased balance, Decreased endurance, Decreased knowledge of use of DME, Decreased mobility, Decreased strength, Impaired perceived functional ability, Impaired flexibility, Postural dysfunction, Impaired tone  Visit Diagnosis: Muscle weakness (generalized)  Other abnormalities of gait and mobility  Unsteadiness on feet  Abnormal posture  Paraplegia, incomplete ([place])     Problem List Patient Active Problem List   Diagnosis Date Noted  . Unilateral inguinal hernia without obstruction or gangrene 06/29/2017  . Nerve pain 05/18/2017  . Spinal cord infarction ([place]) 03/15/2017  . Paraplegia ([place]) 03/07/2017  . Bilateral leg weakness 03/07/2017  . S/P thoracic aortic aneurysm repair 01/21/2017  . Coronary artery calcification 12/23/2016  . S/P aortic dissection repair 12/23/2016  . Thoracic aortic aneurysm without rupture ([place]) 12/23/2016  . Preoperative cardiovascular examination 12/23/2016  . Hypertension 09/23/2015  . Iron deficiency anemia 01/28/2015  . Weight loss 10/30/2014  . Hospital-acquired pneumonia 10/09/2014  . Ischemic leg 10/09/2014  . Aortic dissection ([place]) 10/07/2014    Cameron Sprang, PT, MPT [facility] [address] [place], [place], [address] Phone: [phone]   Fax:  [phone] 07/14/17, 3:52 PM  Name: PER BEAGLEY MRN: [id number] Date of Birth: 09-07-1955

## 2017-07-14 NOTE — Telephone Encounter (Signed)
Dr. Riley KillSwartz,   I am so sorry to do this, but as you know I am seeing Victor [name] Victor [name] for PT at OP neuro.  He was given a foot up brace for RLE, however this does not seem to provide enough support therefore we trialed off the shelf PLS AFO for RLE and this provided adequate support and clearance.  I am going to reach out to [employer] regarding this, but I will need a new order and I may have to have you addend your last note with him again to state needs RLE AFO.  I apologize!  Thanks,  Harriet ButteEmily Corday Wyka, PT, MPT [facility] [address] [place], [place], [address] Phone: [phone]   Fax:  [phone] 07/14/17, 3:54 PM

## 2017-07-14 NOTE — Telephone Encounter (Signed)
No answer.  Left another message to call office.  This was in response to urine drug screen and non narcotic treatment only.

## 2017-07-15 NOTE — Telephone Encounter (Signed)
I spoke with Mr Victor [name] and let him know he is non narcotic treatment by Dr Riley KillSwartz due to oxycodone in urine test.  He replied ok.

## 2017-07-15 NOTE — Telephone Encounter (Signed)
Patient called again asking for call back

## 2017-07-18 ENCOUNTER — Ambulatory Visit: Payer: Medicaid Other | Admitting: Rehabilitation

## 2017-07-18 ENCOUNTER — Encounter: Payer: Self-pay | Admitting: Rehabilitation

## 2017-07-18 DIAGNOSIS — R2681 Unsteadiness on feet: Secondary | ICD-10-CM

## 2017-07-18 DIAGNOSIS — G9511 Acute infarction of spinal cord (embolic) (nonembolic): Secondary | ICD-10-CM

## 2017-07-18 DIAGNOSIS — M6281 Muscle weakness (generalized): Secondary | ICD-10-CM

## 2017-07-18 DIAGNOSIS — R2689 Other abnormalities of gait and mobility: Secondary | ICD-10-CM

## 2017-07-18 NOTE — Therapy (Signed)
[facility] [address] [place], [place], [address] Phone: [phone]   Fax:  [phone]  Physical Therapy Treatment  Patient Details  Name: Victor [name] MRN: [id number] Date of Birth: 06/15/1955 Referring Provider: Alger Simons, MD   Encounter Date: 07/18/2017  PT End of Session - 07/18/17 1633    Visit Number  13 updated due to error on last viist    Number of Visits  21 12 visits approved by Medicaid beginning 07-14-17    Date for PT Re-Evaluation  08/01/17    Authorization Type  Medicaid (12 approved from 3/21-08/24/17    Authorization Time Period  3-21 - 08-24-17    Authorization - Visit Number  2    Authorization - Number of Visits  12    PT Start Time  1316    PT Stop Time  1400    PT Time Calculation (min)  44 min    Activity Tolerance  Patient tolerated treatment well;Patient limited by fatigue    Behavior During Therapy  [facility] for tasks assessed/performed       Past Medical History:  Diagnosis Date  . History of kidney stones   . Hypertension   . Spinal cord infarction ([place]) 02/2017  . Stroke ([place])   . Thoracic aortic aneurysm ([place]) 09/2014    Past Surgical History:  Procedure Laterality Date  . ASCENDING AORTIC ROOT REPLACEMENT N/A 01/21/2017   Procedure: Replacement of Aortic Arch with circulatory arrest;  Surgeon: Grace Isaac, MD;  Location: [facility];  Service: Open Heart Surgery;  Laterality: N/A;  . CARDIAC CATHETERIZATION    . FEMORAL-FEMORAL BYPASS GRAFT N/A 10/07/2014   Procedure: LEFT  FEMORAL ARTERY -RIGHT FEMORAL ARTERY BYPASS GRAFT USING 8MM X 30 CM HEMASHIELD GOLD GRAFT;  Surgeon: Rosetta Posner, MD;  Location: [facility];  Service: Vascular;  Laterality: N/A;  . RIGHT/LEFT HEART CATH AND CORONARY ANGIOGRAPHY N/A 01/11/2017   Procedure: RIGHT/LEFT HEART CATH AND CORONARY ANGIOGRAPHY- Diagnostic Only;  Surgeon: Sherren Mocha, MD;  Location: [facility];  Service: Cardiovascular;   Laterality: N/A;  . s/p thoracic aneurysm repair    . STERNOTOMY N/A 01/21/2017   Procedure: REDO STERNOTOMY;  Surgeon: Grace Isaac, MD;  Location: [facility];  Service: Open Heart Surgery;  Laterality: N/A;  . TEE WITHOUT CARDIOVERSION N/A 01/21/2017   Procedure: TRANSESOPHAGEAL ECHOCARDIOGRAM (TEE);  Surgeon: Grace Isaac, MD;  Location: [facility];  Service: Open Heart Surgery;  Laterality: N/A;  . THORACIC AORTIC ANEURYSM REPAIR N/A 10/06/2014   Procedure: THORACIC ASCENDING ANEURYSM REPAIR (AAA);  Surgeon: Grace Isaac, MD;  Location: [facility];  Service: Open Heart Surgery;  Laterality: N/A;  hyportermia circulatory arrest and resuspension of aortic valve  . THORACIC AORTIC ENDOVASCULAR STENT GRAFT N/A 01/21/2017   Procedure: THORACIC AORTIC ENDOVASCULAR STENT GRAFT;  Surgeon: Serafina Mitchell, MD;  Location: [facility];  Service: Vascular;  Laterality: N/A;  . THROMBECTOMY ILIAC ARTERY Left 03/08/2017   Procedure: THROMBECTOMY of Left subclavian artery;  Surgeon: Angelia Mould, MD;  Location: [facility];  Service: Vascular;  Laterality: Left;  [facility] VASCULAR SURGERY      There were no vitals filed for this visit.  Subjective Assessment - 07/18/17 1527    Subjective  Pt reports he continues to spend much of his time in bed at home.     Pertinent History  Hx of aortic arch replacement and bypass to CCA and L subclavian a. 01/21/17, unsuccessful thrombectomy  L bracial a on 03/08/17    Limitations  House hold activities;Walking    How long can you walk comfortably?  approx 30-50' with RW    Patient Stated Goals  "I want to be able to walk"     Currently in Pain?  No/denies                No data recorded       [facility] Adult PT Treatment/Exercise - 07/18/17 1400      Ambulation/Gait   Ambulation/Gait  Yes    Ambulation/Gait Assistance  5: Supervision;4: Min guard    Ambulation/Gait Assistance Details  Pt ambulatory into clinic today with RW and regular lace up tennis shoes.   Note marked improvement in gait pattern today with decreased trunk rotation to clear L LE.  Once seated, replaced foot up brace in R shoe and donned L  plastic PLS AFO during remainder of gait.  Note that this provided adequate support therefore will continue with foot up brace and not pursue R PLS AFO.  Pt and mother verbalized understanding. Pt able to ambulate x 100' into clinic>seated rest break>115' then seated rest break>then another 26' x 2 with single rest break in between.  Continue to note improved R foot clearance despite being overly fatigued.  Continue to provide cues for upright posture and decreased support on RW so that legs may increase activation.      Ambulation Distance (Feet)  -- see above    Assistive device  Rolling walker L AFO, R foot up brace    Gait Pattern  Step-to pattern;Decreased dorsiflexion - right;Decreased dorsiflexion - left;Decreased hip/knee flexion - right;Decreased hip/knee flexion - left    Ambulation Surface  Level;Indoor    Stairs  Yes    Stairs Assistance  4: Min assist;5: Supervision    Stairs Assistance Details (indicate cue type and reason)  Continue to assess stairs as he reports he leaves RW upstairs and ambulates downstairs using sides of walls despite PT providing education on fall risk with this.  Demonstrated to pt how to fold up RW and carry in opposite hand while using L handrail.  Pt was able to return demo at S level, however did not want to do this at home.  He felt he was unable to do with single rail without having support.  Attempted this with only R rail both forward and sideways, and he was unable to elevate RLE to step, therfore stated that he could use rail and wall when doing stairs but would recommend getting another RW to leave one at top of stairs and have one at bottom of stairs.  Pts mother states that they have another walker in their garage.  Will follow up on this recommendations.      Stair Management Technique  One rail Left;Step to  pattern;Sideways;Forwards;With walker    Number of Stairs  4 x 2 reps    Height of Stairs  6      Self-Care   Self-Care  Other Self-Care Comments    Other Self-Care Comments   Education on being more active at home, using tennis shoes that he wore today to pick up AFO and use of these shoes at all times with both AFO and foot up brace.               PT Education - 07/18/17 1633    Education provided  Yes    Education Details  see self care  Person(s) Educated  Patient;Parent(s)    Methods  Explanation    Comprehension  Verbalized understanding       PT Short Term Goals - 07/04/17 1339      PT SHORT TERM GOAL #1   Title  Pt will initiate HEP in order to indicate improved functional mobility and decreased fall risk.  (Target Date: 07/02/17)    Baseline  met, has initiated but not consistently performing    Time  4    Period  Weeks    Status  Achieved      PT SHORT TERM GOAL #2   Title  Will improve gait velocity by .60 ft/sec in order to indicate improved efficiency of gait and decreased fall risk.     Baseline  .61 ft/sec with RW and no AFO; .80 with RW and trial AFO; .75 ft/sec with RW plastic AFO on LLE, foot up brace on RLE    Time  4    Period  Weeks    Status  Not Met      PT SHORT TERM GOAL #3   Title  Pt will improve TUG to </=40 secs w/ LRAD in order to indicate decreased fall risk.      Baseline  37 secs on 07/04/17    Time  4    Period  Weeks    Status  Achieved      PT SHORT TERM GOAL #4   Title  Pt will ambulate 100' w/ LRAD at S level in order to indicate safe negotiation around home.     Baseline  met 07/04/17    Time  4    Period  Days    Status  Achieved      PT SHORT TERM GOAL #5   Title  Pt will ascend/descend 3 steps with handrails at S level in order to indicate safety with home/community entry.     Baseline  needs min A to ascend, S to descend    Time  4    Period  Weeks    Status  Partially Met        PT Long Term Goals - 06/02/17  2001      PT LONG TERM GOAL #1   Title  Pt will be independent with HEP in order to indicate improved functional mobility and decreased fall risk.  (Target Date: 08/01/17)    Baseline  dependent     Time  8    Period  Weeks    Status  New    Target Date  08/01/17      PT LONG TERM GOAL #2   Title  Pt will improve gait speed by 1.20 ft/sec from baseline in order to indicate decreased fall risk.     Baseline  .75 ft/sec at baseline with RW, no AFO    Time  8    Period  Weeks    Status  New      PT LONG TERM GOAL #3   Title  Pt will perform TUG </=30 secs w/ LRAD in order to indicate decreased fall risk.      Baseline  51.16 secs with RW at baseline    Time  8    Period  Weeks    Status  New      PT LONG TERM GOAL #4   Title  Pt will ambulate 300' w/ LRAD over indoor surfaces at mod I level in order to indicate safe negotation in home.  Baseline  40' w/ RW at min A level     Time  8    Period  Weeks    Status  New      PT LONG TERM GOAL #5   Title  Pt will ambulate 200' w/ LRAD over paved outdoor surfaces at S level in order to indicate pt slow return to community gait.      Baseline  unable to assess outdoor gait on eval due to time constraint and safety concerns.     Time  8    Period  Weeks    Status  New            Plan - 07/18/17 1634    Clinical Impression Statement  Skilled session continues to focus on gait with L plastic PLS AFO and due to proper shoe wear today, use of foot up brace on RLE.  Note marked improvement in foot clearance with foot up brace in this shoe, therefore will not pursue walk on AFO for RLE.  Pt and mother verbalized understanding.  Also addressed stairs as he is still walking without AD downstairs.  Ultimately recommend he use second RW in order to have one upstairs and downstairs.  Pt verbalized understanding.     Rehab Potential  Good    PT Frequency  1x / week    PT Duration  3 weeks    PT Treatment/Interventions  ADLs/Self Care Home  Management;Electrical Stimulation;DME Instruction;Gait training;Stair training;Functional mobility training;Therapeutic activities;Therapeutic exercise;Balance training;Neuromuscular re-education;Patient/family education;Orthotic Fit/Training;Vestibular    PT Next Visit Plan  core strength (tall kneeling, half kneeling, quadruped as able)  cont bil. LE strengthening: pt had not followed up on [facility] as of 07-11-17    Consulted and Agree with Plan of Care  Patient;Family member/caregiver       Patient will benefit from skilled therapeutic intervention in order to improve the following deficits and impairments:  Abnormal gait, Cardiopulmonary status limiting activity, Decreased activity tolerance, Decreased balance, Decreased endurance, Decreased knowledge of use of DME, Decreased mobility, Decreased strength, Impaired perceived functional ability, Impaired flexibility, Postural dysfunction, Impaired tone  Visit Diagnosis: Spinal cord infarction (HCC)  Muscle weakness (generalized)  Other abnormalities of gait and mobility  Unsteadiness on feet     Problem List Patient Active Problem List   Diagnosis Date Noted  . Unilateral inguinal hernia without obstruction or gangrene 06/29/2017  . Nerve pain 05/18/2017  . Spinal cord infarction ([place]) 03/15/2017  . Paraplegia ([place]) 03/07/2017  . Bilateral leg weakness 03/07/2017  . S/P thoracic aortic aneurysm repair 01/21/2017  . Coronary artery calcification 12/23/2016  . S/P aortic dissection repair 12/23/2016  . Thoracic aortic aneurysm without rupture ([place]) 12/23/2016  . Preoperative cardiovascular examination 12/23/2016  . Hypertension 09/23/2015  . Iron deficiency anemia 01/28/2015  . Weight loss 10/30/2014  . Hospital-acquired pneumonia 10/09/2014  . Ischemic leg 10/09/2014  . Aortic dissection ([place]) 10/07/2014    Cameron Sprang, PT, MPT [facility] [address] [place],  [place], [address] Phone: [phone]   Fax:  [phone] 07/18/17, 4:37 PM  Name: Victor [name] MRN: [id number] Date of Birth: 1955/09/30

## 2017-07-20 DIAGNOSIS — M21372 Foot drop, left foot: Secondary | ICD-10-CM | POA: Diagnosis not present

## 2017-07-21 ENCOUNTER — Ambulatory Visit: Payer: Medicaid Other | Admitting: Rehabilitation

## 2017-07-25 ENCOUNTER — Ambulatory Visit: Payer: Medicaid Other | Attending: Physical Medicine & Rehabilitation | Admitting: Rehabilitation

## 2017-07-25 ENCOUNTER — Encounter: Payer: Self-pay | Admitting: Rehabilitation

## 2017-07-25 DIAGNOSIS — R293 Abnormal posture: Secondary | ICD-10-CM | POA: Insufficient documentation

## 2017-07-25 DIAGNOSIS — R2681 Unsteadiness on feet: Secondary | ICD-10-CM

## 2017-07-25 DIAGNOSIS — G8222 Paraplegia, incomplete: Secondary | ICD-10-CM | POA: Diagnosis present

## 2017-07-25 DIAGNOSIS — R2689 Other abnormalities of gait and mobility: Secondary | ICD-10-CM | POA: Diagnosis present

## 2017-07-25 DIAGNOSIS — M6281 Muscle weakness (generalized): Secondary | ICD-10-CM | POA: Diagnosis present

## 2017-07-25 DIAGNOSIS — G9511 Acute infarction of spinal cord (embolic) (nonembolic): Secondary | ICD-10-CM | POA: Insufficient documentation

## 2017-07-25 NOTE — Therapy (Signed)
[facility] [address] [place], [place], [address] Phone: [phone]   Fax:  [phone]  Physical Therapy Treatment  Patient Details  Name: Victor [name] MRN: [id number] Date of Birth: [birth year] Referring Provider: Alger Simons, MD   Encounter Date: 07/25/2017  PT End of Session - 07/25/17 1336    Visit Number  13 updating error    Number of Visits  21 12 visits approved by Medicaid beginning 07-14-17    Date for PT Re-Evaluation  08/01/17    Authorization Type  Medicaid (12 approved from 3/21-08/24/17    Authorization Time Period  3-21 - 08-24-17    Authorization - Visit Number  10    Authorization - Number of Visits  12    PT Start Time  1316    PT Stop Time  1400    PT Time Calculation (min)  44 min    Activity Tolerance  Patient tolerated treatment well;Patient limited by fatigue    Behavior During Therapy  [facility] for tasks assessed/performed       Past Medical History:  Diagnosis Date  . History of kidney stones   . Hypertension   . Spinal cord infarction ([place]) 02/2017  . Stroke ([place])   . Thoracic aortic aneurysm ([place]) 09/2014    Past Surgical History:  Procedure Laterality Date  . ASCENDING AORTIC ROOT REPLACEMENT N/A 01/21/2017   Procedure: Replacement of Aortic Arch with circulatory arrest;  Surgeon: Grace Isaac, MD;  Location: [facility];  Service: Open Heart Surgery;  Laterality: N/A;  . CARDIAC CATHETERIZATION    . FEMORAL-FEMORAL BYPASS GRAFT N/A 10/07/2014   Procedure: LEFT  FEMORAL ARTERY -RIGHT FEMORAL ARTERY BYPASS GRAFT USING 8MM X 30 CM HEMASHIELD GOLD GRAFT;  Surgeon: Rosetta Posner, MD;  Location: [facility];  Service: Vascular;  Laterality: N/A;  . RIGHT/LEFT HEART CATH AND CORONARY ANGIOGRAPHY N/A 01/11/2017   Procedure: RIGHT/LEFT HEART CATH AND CORONARY ANGIOGRAPHY- Diagnostic Only;  Surgeon: Sherren Mocha, MD;  Location: [facility];  Service: Cardiovascular;  Laterality: N/A;  . s/p  thoracic aneurysm repair    . STERNOTOMY N/A 01/21/2017   Procedure: REDO STERNOTOMY;  Surgeon: Grace Isaac, MD;  Location: [facility];  Service: Open Heart Surgery;  Laterality: N/A;  . TEE WITHOUT CARDIOVERSION N/A 01/21/2017   Procedure: TRANSESOPHAGEAL ECHOCARDIOGRAM (TEE);  Surgeon: Grace Isaac, MD;  Location: [facility];  Service: Open Heart Surgery;  Laterality: N/A;  . THORACIC AORTIC ANEURYSM REPAIR N/A 10/06/2014   Procedure: THORACIC ASCENDING ANEURYSM REPAIR (AAA);  Surgeon: Grace Isaac, MD;  Location: [facility];  Service: Open Heart Surgery;  Laterality: N/A;  hyportermia circulatory arrest and resuspension of aortic valve  . THORACIC AORTIC ENDOVASCULAR STENT GRAFT N/A 01/21/2017   Procedure: THORACIC AORTIC ENDOVASCULAR STENT GRAFT;  Surgeon: Serafina Mitchell, MD;  Location: [facility];  Service: Vascular;  Laterality: N/A;  . THROMBECTOMY ILIAC ARTERY Left 03/08/2017   Procedure: THROMBECTOMY of Left subclavian artery;  Surgeon: Angelia Mould, MD;  Location: [facility];  Service: Vascular;  Laterality: Left;  [facility] Kitchen VASCULAR SURGERY      There were no vitals filed for this visit.  Subjective Assessment - 07/25/17 1454    Subjective  Pt reports that he only has 2 visits left.  PT educated that this is correct based on MCD allowance.  Will plan to ask for more at end of this episode.      Pertinent History  Hx of  aortic arch replacement and bypass to CCA and L subclavian a. 01/21/17, unsuccessful thrombectomy L bracial a on 03/08/17    Limitations  House hold activities;Walking    How long can you walk comfortably?  approx 30-50' with RW    Patient Stated Goals  "I want to be able to walk"     Currently in Pain?  No/denies                       [facility] Adult PT Treatment/Exercise - 07/25/17 0001      Transfers   Transfers  Sit to Stand    Sit to Stand  6: Modified independent (Device/Increase time)    Stand to Sit  6: Modified independent (Device/Increase time)       Ambulation/Gait   Ambulation/Gait  Yes    Ambulation/Gait Assistance  5: Supervision    Ambulation/Gait Assistance Details  Pt able to ambulate with more fluid gait pattern following NMR tasks.  Note more upright posture and improved weight shift.  Cues for decreased trunk rotation on the LLE.  Also educated on continued use of R foot up brace as he did not have this donned today.  Feel that he still needs foot up brace as he is unable to perform heel to toe gait pattern on R and R foot scuffs floor at every step.  Pt verbalized understanding.      Ambulation Distance (Feet)  85 Feet into clinic and then another 115' during session    Assistive device  Rolling walker    Gait Pattern  Step-to pattern;Decreased dorsiflexion - right;Decreased dorsiflexion - left;Decreased hip/knee flexion - right;Decreased hip/knee flexion - left    Ambulation Surface  Level;Indoor      Self-Care   Self-Care  Other Self-Care Comments    Other Self-Care Comments   Educated pt and mother on MCD visits and how pt had two visits next week, which would end this POC but could ask for 12 more visits.  Note that they were needing to cancel Friday due to mother not being able to bring him, so plan is to keep this Thursday, next thrusday and added a visit on 4/22 to complete first 12 visits.  Will plan to ask for more visits nearer to the end of these visits.       Neuro Re-ed    Neuro Re-ed Details   Performed NMR in tall kneeling/quadruped and half kneeling in order to improve proximal hip activation as well as core activation.  Began in tall kneeling with UE support on ball while alternating UE support x 10 reps.  Maintaining balance without UE support x 3 reps of 10 secs.   Progressed to quadruped alternating UE extension x 2 sets of 10 reps with rest in between.  Cues for core activation and positioning to do this.  Then had pt get into R half kneeling (PT assisted with R foot placement) with BUE support on [facility] bench.   Once in half kneeling, worked on reducing UE support and improved stance proximal hip control.  He tends to have forward weight shift not fully loading LLE, therefore provided max assist initially for task as pt very fearful of falling.  Pt was only able to remove single UE support on LLE, however was able to elevate BUE when in L half kneeling.               PT Education - 07/25/17 1503    Education provided  Yes    Education Details  see self care    Person(s) Educated  Patient    Methods  Explanation    Comprehension  Verbalized understanding       PT Short Term Goals - 07/04/17 1339      PT SHORT TERM GOAL #1   Title  Pt will initiate HEP in order to indicate improved functional mobility and decreased fall risk.  (Target Date: 07/02/17)    Baseline  met, has initiated but not consistently performing    Time  4    Period  Weeks    Status  Achieved      PT SHORT TERM GOAL #2   Title  Will improve gait velocity by .60 ft/sec in order to indicate improved efficiency of gait and decreased fall risk.     Baseline  .80 ft/sec with RW and no AFO; .80 with RW and trial AFO; .75 ft/sec with RW plastic AFO on LLE, foot up brace on RLE    Time  4    Period  Weeks    Status  Not Met      PT SHORT TERM GOAL #3   Title  Pt will improve TUG to </=40 secs w/ LRAD in order to indicate decreased fall risk.      Baseline  37 secs on 07/04/17    Time  4    Period  Weeks    Status  Achieved      PT SHORT TERM GOAL #4   Title  Pt will ambulate 100' w/ LRAD at S level in order to indicate safe negotiation around home.     Baseline  met 07/04/17    Time  4    Period  Days    Status  Achieved      PT SHORT TERM GOAL #5   Title  Pt will ascend/descend 3 steps with handrails at S level in order to indicate safety with home/community entry.     Baseline  needs min A to ascend, S to descend    Time  4    Period  Weeks    Status  Partially Met        PT Long Term Goals - 06/02/17 2001       PT LONG TERM GOAL #1   Title  Pt will be independent with HEP in order to indicate improved functional mobility and decreased fall risk.  (Target Date: 08/01/17)    Baseline  dependent     Time  8    Period  Weeks    Status  New    Target Date  08/01/17      PT LONG TERM GOAL #2   Title  Pt will improve gait speed by 1.20 ft/sec from baseline in order to indicate decreased fall risk.     Baseline  .75 ft/sec at baseline with RW, no AFO    Time  8    Period  Weeks    Status  New      PT LONG TERM GOAL #3   Title  Pt will perform TUG </=30 secs w/ LRAD in order to indicate decreased fall risk.      Baseline  51.16 secs with RW at baseline    Time  8    Period  Weeks    Status  New      PT LONG TERM GOAL #4   Title  Pt will ambulate 300' w/ LRAD over indoor surfaces at  mod I level in order to indicate safe negotation in home.     Baseline  40' w/ RW at min A level     Time  8    Period  Weeks    Status  New      PT LONG TERM GOAL #5   Title  Pt will ambulate 200' w/ LRAD over paved outdoor surfaces at S level in order to indicate pt slow return to community gait.      Baseline  unable to assess outdoor gait on eval due to time constraint and safety concerns.     Time  8    Period  Weeks    Status  New            Plan - 07/25/17 1505    Clinical Impression Statement  Pt present with his new AFO for LLE however he did not bring foot up brace for RLE.  Recommend he bring it next visit so that PT can set up in shoe.  Pt verbalized understanding.  Also continue to work on [employer] for improved BLE activation as well as core activation and discussion of current POC due to MCD approval.      Rehab Potential  Good    PT Frequency  1x / week    PT Duration  3 weeks    PT Treatment/Interventions  ADLs/Self Care Home Management;Electrical Stimulation;DME Instruction;Gait training;Stair training;Functional mobility training;Therapeutic activities;Therapeutic exercise;Balance  training;Neuromuscular re-education;Patient/family education;Orthotic Fit/Training;Vestibular    PT Next Visit Plan  core strength (tall kneeling, half kneeling, quadruped as able)  cont bil. LE strengthening: pt had not followed up on [facility] as of 07-11-17    Consulted and Agree with Plan of Care  Patient;Family member/caregiver       Patient will benefit from skilled therapeutic intervention in order to improve the following deficits and impairments:  Abnormal gait, Cardiopulmonary status limiting activity, Decreased activity tolerance, Decreased balance, Decreased endurance, Decreased knowledge of use of DME, Decreased mobility, Decreased strength, Impaired perceived functional ability, Impaired flexibility, Postural dysfunction, Impaired tone  Visit Diagnosis: Spinal cord infarction (HCC)  Muscle weakness (generalized)  Other abnormalities of gait and mobility  Unsteadiness on feet     Problem List Patient Active Problem List   Diagnosis Date Noted  . Unilateral inguinal hernia without obstruction or gangrene 06/29/2017  . Nerve pain 05/18/2017  . Spinal cord infarction ([place]) 03/15/2017  . Paraplegia ([place]) 03/07/2017  . Bilateral leg weakness 03/07/2017  . S/P thoracic aortic aneurysm repair 01/21/2017  . Coronary artery calcification 12/23/2016  . S/P aortic dissection repair 12/23/2016  . Thoracic aortic aneurysm without rupture ([place]) 12/23/2016  . Preoperative cardiovascular examination 12/23/2016  . Hypertension 09/23/2015  . Iron deficiency anemia 01/28/2015  . Weight loss 10/30/2014  . Hospital-acquired pneumonia 10/09/2014  . Ischemic leg 10/09/2014  . Aortic dissection ([place]) 10/07/2014    Cameron Sprang, PT, MPT [facility] [address] [place], [place], [address] Phone: [phone]   Fax:  [phone] 07/25/17, 3:10 PM  Name: Victor [name] MRN: [id number] Date of Birth: 1955/05/12

## 2017-07-28 ENCOUNTER — Encounter: Payer: Self-pay | Admitting: Rehabilitation

## 2017-07-28 ENCOUNTER — Ambulatory Visit: Payer: Medicaid Other | Admitting: Rehabilitation

## 2017-07-28 DIAGNOSIS — M6281 Muscle weakness (generalized): Secondary | ICD-10-CM

## 2017-07-28 DIAGNOSIS — R2689 Other abnormalities of gait and mobility: Secondary | ICD-10-CM

## 2017-07-28 DIAGNOSIS — R2681 Unsteadiness on feet: Secondary | ICD-10-CM

## 2017-07-28 DIAGNOSIS — G9511 Acute infarction of spinal cord (embolic) (nonembolic): Secondary | ICD-10-CM | POA: Diagnosis not present

## 2017-07-28 NOTE — Therapy (Signed)
[facility] [address] [place], [place], [address] Phone: [phone]   Fax:  [phone]  Physical Therapy Treatment  Patient Details  Name: Victor [name] MRN: [id number] Date of Birth: 1955/07/27 Referring Provider: Alger Simons, MD   Encounter Date: 07/28/2017  PT End of Session - 07/28/17 1648    Visit Number  14 updating error    Number of Visits  21 12 visits approved by Medicaid beginning 07-14-17    Date for PT Re-Evaluation  08/01/17    Authorization Type  Medicaid (12 approved from 3/21-08/24/17    Authorization Time Period  3-21 - 08-24-17    Authorization - Visit Number  11    Authorization - Number of Visits  12    PT Start Time  8676    PT Stop Time  1400    PT Time Calculation (min)  45 min    Activity Tolerance  Patient tolerated treatment well;Patient limited by fatigue    Behavior During Therapy  [facility] for tasks assessed/performed       Past Medical History:  Diagnosis Date  . History of kidney stones   . Hypertension   . Spinal cord infarction ([place]) 02/2017  . Stroke ([place])   . Thoracic aortic aneurysm ([place]) 09/2014    Past Surgical History:  Procedure Laterality Date  . ASCENDING AORTIC ROOT REPLACEMENT N/A 01/21/2017   Procedure: Replacement of Aortic Arch with circulatory arrest;  Surgeon: Grace Isaac, MD;  Location: [facility];  Service: Open Heart Surgery;  Laterality: N/A;  . CARDIAC CATHETERIZATION    . FEMORAL-FEMORAL BYPASS GRAFT N/A 10/07/2014   Procedure: LEFT  FEMORAL ARTERY -RIGHT FEMORAL ARTERY BYPASS GRAFT USING 8MM X 30 CM HEMASHIELD GOLD GRAFT;  Surgeon: Rosetta Posner, MD;  Location: [facility];  Service: Vascular;  Laterality: N/A;  . RIGHT/LEFT HEART CATH AND CORONARY ANGIOGRAPHY N/A 01/11/2017   Procedure: RIGHT/LEFT HEART CATH AND CORONARY ANGIOGRAPHY- Diagnostic Only;  Surgeon: Sherren Mocha, MD;  Location: [place] CV LAB;  Service: Cardiovascular;  Laterality: N/A;  . s/p  thoracic aneurysm repair    . STERNOTOMY N/A 01/21/2017   Procedure: REDO STERNOTOMY;  Surgeon: Grace Isaac, MD;  Location: [facility];  Service: Open Heart Surgery;  Laterality: N/A;  . TEE WITHOUT CARDIOVERSION N/A 01/21/2017   Procedure: TRANSESOPHAGEAL ECHOCARDIOGRAM (TEE);  Surgeon: Grace Isaac, MD;  Location: [facility];  Service: Open Heart Surgery;  Laterality: N/A;  . THORACIC AORTIC ANEURYSM REPAIR N/A 10/06/2014   Procedure: THORACIC ASCENDING ANEURYSM REPAIR (AAA);  Surgeon: Grace Isaac, MD;  Location: [place];  Service: Open Heart Surgery;  Laterality: N/A;  hyportermia circulatory arrest and resuspension of aortic valve  . THORACIC AORTIC ENDOVASCULAR STENT GRAFT N/A 01/21/2017   Procedure: THORACIC AORTIC ENDOVASCULAR STENT GRAFT;  Surgeon: Serafina Mitchell, MD;  Location: [facility];  Service: Vascular;  Laterality: N/A;  . THROMBECTOMY ILIAC ARTERY Left 03/08/2017   Procedure: THROMBECTOMY of Left subclavian artery;  Surgeon: Angelia Mould, MD;  Location: [facility];  Service: Vascular;  Laterality: Left;  Marland Kitchen VASCULAR SURGERY      There were no vitals filed for this visit.  Subjective Assessment - 07/28/17 1316    Subjective  Pt reports no changes, no falls.     Patient is accompained by:  Family member    Pertinent History  Hx of aortic arch replacement and bypass to CCA and L subclavian a. 01/21/17, unsuccessful thrombectomy L bracial a  on 03/08/17    Limitations  House hold activities;Walking    How long can you walk comfortably?  approx 30-50' with RW    Patient Stated Goals  "I want to be able to walk"     Currently in Pain?  Yes    Pain Score  3     Pain Location  Back    Pain Orientation  Lower    Pain Descriptors / Indicators  Aching    Pain Type  Chronic pain    Pain Onset  More than a month ago    Pain Frequency  Intermittent    Aggravating Factors   Don't know    Pain Relieving Factors  walking                       [facility] Adult PT  Treatment/Exercise - 07/28/17 1354      Ambulation/Gait   Ambulation/Gait  Yes    Ambulation/Gait Assistance  4: Min assist    Ambulation/Gait Assistance Details  Continue to work on gait today but utilized [facility] today during session.  Pt able to ambulate at min A to min/guard level.  Pt needed constant cues for sequencing and decreased trunk movement in order to avoid compensation as well as cues for improved RLE weight shift and weight bearing to avoid pushing through PTs hand on L side.  Pt able to do this with cues.  Also provided cues for upright posture.  Pt did very well with SBQC and feel that this could be a progression from the RW.  Pt verbalized understanding.      Ambulation Distance (Feet)  35 Feet thne 25'     Assistive device  Small based quad cane L AFO and R foot up brace    Gait Pattern  Step-to pattern;Decreased dorsiflexion - right;Decreased dorsiflexion - left;Decreased hip/knee flexion - right;Decreased hip/knee flexion - left    Ambulation Surface  Level;Indoor      Neuro Re-ed    Neuro Re-ed Details   Standing NMR for BLE strength and stability with standing hip abd x 10 reps with cues for improved activation on stance leg, standing hip extension x 10 reps each side, attempted standing knee flexion, however pt unable to perform at this time, therefore maintained supine heel slides.  Standing mini squats x 10 reps with chair behind for safety.  Side stepping along counter top x 4 reps.  See updated HEP.       Knee/Hip Exercises: Aerobic   Nustep  Level 3 with BUEs x 6 mins (pt did 2 mins with LEs only at level 2 resistance.). Tolerated well.              PT Education - 07/28/17 1648    Education provided  Yes    Education Details  updated HEP    Person(s) Educated  Patient    Methods  Explanation;Demonstration;Handout    Comprehension  Verbalized understanding;Returned demonstration       PT Short Term Goals - 07/04/17 1339      PT SHORT TERM GOAL #1   Title   Pt will initiate HEP in order to indicate improved functional mobility and decreased fall risk.  (Target Date: 07/02/17)    Baseline  met, has initiated but not consistently performing    Time  4    Period  Weeks    Status  Achieved      PT SHORT TERM GOAL #2   Title  Will improve gait velocity by .60 ft/sec in order to indicate improved efficiency of gait and decreased fall risk.     Baseline  .60 ft/sec with RW and no AFO; .80 with RW and trial AFO; .75 ft/sec with RW plastic AFO on LLE, foot up brace on RLE    Time  4    Period  Weeks    Status  Not Met      PT SHORT TERM GOAL #3   Title  Pt will improve TUG to </=40 secs w/ LRAD in order to indicate decreased fall risk.      Baseline  37 secs on 07/04/17    Time  4    Period  Weeks    Status  Achieved      PT SHORT TERM GOAL #4   Title  Pt will ambulate 100' w/ LRAD at S level in order to indicate safe negotiation around home.     Baseline  met 07/04/17    Time  4    Period  Days    Status  Achieved      PT SHORT TERM GOAL #5   Title  Pt will ascend/descend 3 steps with handrails at S level in order to indicate safety with home/community entry.     Baseline  needs min A to ascend, S to descend    Time  4    Period  Weeks    Status  Partially Met        PT Long Term Goals - 07/28/17 1322      PT LONG TERM GOAL #1   Title  Pt will be independent with HEP in order to indicate improved functional mobility and decreased fall risk.  (Target Date: 08/01/17)    Baseline  dependent     Time  8    Period  Weeks    Status  On-going      PT LONG TERM GOAL #2   Title  Pt will improve gait speed by 1.20 ft/sec from baseline in order to indicate decreased fall risk.     Baseline  .75 ft/sec at baseline with RW, no AFO, 1.47 ft/sec     Time  8    Period  Weeks    Status  Achieved      PT LONG TERM GOAL #3   Title  Pt will perform TUG </=30 secs w/ LRAD in order to indicate decreased fall risk.      Baseline  51.16 secs with RW  at baseline to 23.28 secs with RW (L AFO, R foot up brace) on 07/28/17    Time  8    Period  Weeks    Status  Achieved      PT LONG TERM GOAL #4   Title  Pt will ambulate 300' w/ LRAD over indoor surfaces at mod I level in order to indicate safe negotation in home.     Baseline  40' w/ RW at min A level     Time  8    Period  Weeks    Status  On-going      PT LONG TERM GOAL #5   Title  Pt will ambulate 200' w/ LRAD over paved outdoor surfaces at S level in order to indicate pt slow return to community gait.      Baseline  unable to assess outdoor gait on eval due to time constraint and safety concerns.     Time  8  Period  Weeks    Status  On-going            Plan - 07/28/17 1649    Clinical Impression Statement  Pt present today with L AFO and R foot up  brace for PT to don correctly in shoe.  Note marked improvement once this one done.  Began checking LTGs in order to ask for additional visits. Pt is making excellent progress with strength (HEP), TUG and gait speed.  See goal section below.     Rehab Potential  Good    PT Frequency  1x / week    PT Duration  3 weeks    PT Treatment/Interventions  ADLs/Self Care Home Management;Electrical Stimulation;DME Instruction;Gait training;Stair training;Functional mobility training;Therapeutic activities;Therapeutic exercise;Balance training;Neuromuscular re-education;Patient/family education;Orthotic Fit/Training;Vestibular    PT Next Visit Plan  Requested additional visits, check to see if approved before seeing on 4/22.  Update goals.  core strength (tall kneeling, half kneeling, quadruped as able)  cont bil. LE strengthening: pt had not followed up on [facility] as of 07-11-17    Consulted and Agree with Plan of Care  Patient;Family member/caregiver       Patient will benefit from skilled therapeutic intervention in order to improve the following deficits and impairments:  Abnormal gait, Cardiopulmonary status limiting activity,  Decreased activity tolerance, Decreased balance, Decreased endurance, Decreased knowledge of use of DME, Decreased mobility, Decreased strength, Impaired perceived functional ability, Impaired flexibility, Postural dysfunction, Impaired tone  Visit Diagnosis: Spinal cord infarction (HCC)  Muscle weakness (generalized)  Other abnormalities of gait and mobility  Unsteadiness on feet     Problem List Patient Active Problem List   Diagnosis Date Noted  . Unilateral inguinal hernia without obstruction or gangrene 06/29/2017  . Nerve pain 05/18/2017  . Spinal cord infarction ([place]) 03/15/2017  . Paraplegia ([place]) 03/07/2017  . Bilateral leg weakness 03/07/2017  . S/P thoracic aortic aneurysm repair 01/21/2017  . Coronary artery calcification 12/23/2016  . S/P aortic dissection repair 12/23/2016  . Thoracic aortic aneurysm without rupture ([place]) 12/23/2016  . Preoperative cardiovascular examination 12/23/2016  . Hypertension 09/23/2015  . Iron deficiency anemia 01/28/2015  . Weight loss 10/30/2014  . Hospital-acquired pneumonia 10/09/2014  . Ischemic leg 10/09/2014  . Aortic dissection ([place]) 10/07/2014    Cameron Sprang, PT, MPT [facility] [address] [place], [place], [address] Phone: [phone]   Fax:  [phone] 07/28/17, 4:52 PM  Name: TOMAS SCHAMP MRN: [id number] Date of Birth: January 26, 1956

## 2017-07-28 NOTE — Patient Instructions (Addendum)
Heel Slides    Squeeze pelvic floor and hold. Slide left heel along bed towards bottom. Hold for _2-3__ seconds. Slide back to flat knee position. Repeat _10__ times. Do _1-2__ times a day. Repeat with other leg. Use strap if needed to help your leg bend, but use as much muscle strength as you can. Also have family assist as needed to make sure knee stays in line with body.    Copyright  VHI. All rights reserved.   Bridging    Slowly raise buttocks from floor, keeping stomach tight. Repeat _10___ times per set. Do __1__ sets per session. Do __2__ sessions per day.  Use your hands lightly to hold legs in place, but fight to keep legs in line with shoulders as much as you can with the muscles in your leg.    http://orth.exer.us/1096   Copyright  VHI. All rights reserved.    Drop / Hold    Do this with countertop in front of you.  Feet shoulder width apart, relax leg muscles and drop into small squat. Stop the downward motion and hold a squat _2-3__ seconds. Repeat _10__ times per set. Do 1-2 times per day. Have a chair behind you for safety in case of loss of balance or fatigue.   http://plyo.exer.us/69   Copyright  VHI. All rights reserved.    Side-Stepping    Do this at counter top for support.  Walk to left side with eyes open. Take even steps, leading with same foot. Make sure each foot lifts off the floor. Repeat in opposite direction. Repeat for 4 laps down and back. Do __2__ sessions per day.  Copyright  VHI. All rights reserved.

## 2017-08-01 ENCOUNTER — Encounter: Payer: Self-pay | Admitting: Internal Medicine

## 2017-08-01 ENCOUNTER — Ambulatory Visit: Payer: Self-pay | Admitting: Internal Medicine

## 2017-08-01 VITALS — BP 128/77 | HR 83 | Ht 67.0 in | Wt 152.2 lb

## 2017-08-01 DIAGNOSIS — Z01810 Encounter for preprocedural cardiovascular examination: Secondary | ICD-10-CM

## 2017-08-01 DIAGNOSIS — Z8679 Personal history of other diseases of the circulatory system: Secondary | ICD-10-CM

## 2017-08-01 DIAGNOSIS — Z9889 Other specified postprocedural states: Secondary | ICD-10-CM

## 2017-08-01 DIAGNOSIS — I10 Essential (primary) hypertension: Secondary | ICD-10-CM

## 2017-08-01 DIAGNOSIS — G9511 Acute infarction of spinal cord (embolic) (nonembolic): Secondary | ICD-10-CM

## 2017-08-01 NOTE — Progress Notes (Signed)
Grossly normal   OFFICE NOTE  Chief Complaint:  Follow-up surgery  Primary Care Physician: Ranelle OysterSwartz, Zachary T, MD  HPI:  Victor [name] is a 62 y.o. male with a history of tobacco abuse, polysubstance abuse and recent thoracic aortic dissection. No family history of coronary artery disease. He was admitted from June 12 of 10/14/2014 and underwent repair of the ascending aortic dissection with replacement of the ascending aorta and suspension of the aortic valve with cardiopulmonary bypass. TEE during surgery revealed an ejection fraction of 60-65%.He was recently seen by Wilburt FinlayBryan Hager, PA-C-C and follow-up. He was noted to not be taking his beta blocker at the time. He says he may never have had this prescription filled. Otherwise he is asymptomatic. He denies any chest pain or shortness of breath. He was on iron in the hospital for anemia and is questioning whether he needs to be on this today or not. He reports he has stopped smoking which I've encouraged him to maintain.  09/23/2015  Mr. Victor PeaHarris returns today for follow-up. He was recently seen by Dr. Ofilia NeasEd Gerhardt, who noted that his blood pressure was elevated. He increased his metoprolol to 25 mg twice a day. Today's blood pressure is well-controlled at 122/74. He denies any side effects from the increased dose of Lopressor. He is on aspirin 325 mg daily. EKG shows normal sinus rhythm at 91 with biatrial enlargement. He denies any chest pain or worsening shortness of breath. He is physically active. He has been having problems apparently with a hernia and is requesting a referral for primary care physician.  12/23/2016  Mr. Victor PeaHarris returns today for follow-up of the thoracic aortic aneurysm. This previously described he data type I dissection in 2015 and had repair along with replacement of ascending aorta and resuspension of the aortic valve. Subsequently he's had progressive enlargement of thoracic aorta now measuring 5.6 cm. He also has an  unrepaired inguinal hernia. He was referred for preoperative stress evaluation and underwent Lexiscan Myoview which was negative for ischemia and showed normal LV function. He scheduled to have an echocardiogram today to assess he is aortic valve and degree of aortic insufficiency. He denies any chest pain or worsening shortness of breath. He's currently only on aspirin and metoprolol. He reports he is not taking his metoprolol for the last 3 days because he was not eating breakfast. I_the critical importance of taking that medicine and the fact he could take it without eating. Blood pressure is slightly elevated today 140/93 but his heart rate is in the 90s. This is not ideal given his aneurysm. He also has not had a lipid assessment since 2016. At the time LDL-C was 63 and not on medications. I'll plan to reassess that today as well as a metabolic profile.  08/01/2017  Victor [name] returns today for follow-up.  He continues to improve with regard to his spinal stroke.  His echo in August last year only showed trivial aortic insufficiency.  Myoview demonstrated normal LVEF without ischemia.  He was noted to have an enlarging aortic arch aneurysm and underwent right and left heart catheterization by Dr. Excell Seltzerooper in 2018, this demonstrated angiographically normal right dominant coronary arteries.  Subsequently he had surgery without cardiac complications.  He denies any chest pain or worsening shortness of breath.  He is now planning to have a hernia repair.  He is concerned given his history of fem-femoral bypass graft and aneurysm repair, that there may be an issue with his hernia repair.  PMHx:  Past Medical History:  Diagnosis Date  . History of kidney stones   . Hypertension   . 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 Stroke (H[facility])   . Thoracic aortic aneurysm ([facility]CC) 09/2014    Past Surgical History:  Procedure Laterality Date  . ASCENDING AORTIC ROOT REPLACEMENT N/A 01/21/2017   Procedure: Replacement of  Aortic Arch with circulatory arrest;  Surgeon: [name], MD;  Location: [facility] OR;  Service: Open Heart Surgery;  Laterality: N/A;  . CARDIAC CATHETERIZATION    . FEMORAL-FEMORAL BYPASS GRAFT N/A 10/07/2014   Procedure: LEFT  FEMORAL ARTERY -RIGHT FEMORAL ARTERY BYPASS GRAFT USING X 30 CM HEMASHIELD GOLD GRAFT;  Surgeon: Larina Earthly, MD;  Location: [facility] OR;  Service: Vascular;  Laterality: N/A;  . RIGHT/LEFT HEART CATH AND CORONARY ANGIOGRAPHY N/A 01/11/2017   Procedure: RIGHT/LEFT HEART CATH AND CORONARY ANGIOGRAPHY- Diagnostic Only;  Surgeon: [name], MD;  Location: [facility] INVASIVE CV LAB;  Service: Cardiovascular;  Laterality: N/A;  . s/p thoracic aneurysm repair    . STERNOTOMY N/A 01/21/2017   Procedure: REDO STERNOTOMY;  Surgeon: [name], MD;  [facility];  Service: Open Heart Surgery;  Laterality: N/A;  . TEE WITHOUT CARDIOVERSION N/A 01/21/2017   Procedure: TRANSESOPHAGEAL ECHOCARDIOGRAM (TEE);  Surgeon: [name], MD;  Location: [facility] OR;  Service: Open Heart Surgery;  Laterality: N/A;  . THORACIC AORTIC ANEURYSM REPAIR N/A 10/06/2014   Procedure: THORACIC ASCENDING ANEURYSM REPAIR (AAA);  Surgeon: [name], MD;  Location[facility] OR;  Service: Open Heart Surgery;  Laterality: N/A;  hyportermia circulatory arrest and resuspension of aortic valve  . THORACIC AORTIC ENDOVASCULAR STENT GRAFT N/A 01/21/2017   Procedure: THORACIC AORTIC ENDOVASCULAR STENT GRAFT;  Surgeon: Nada Libman, MD;  Location: [facility] OR;  Service: Vascular;  Laterality: N/A;  . THROMBECTOMY ILIAC ARTERY Left 03/08/2017   Procedure: THROMBECTOMY of Left subclavian artery;  Surgeon: Chuck Hint, MD;  Location: [facility] OR;  Service: Vascular;  Laterality: Left;  Marland Kitchen VASCULAR SURGERY      FAMHx:  Family History  Problem Relation Age of Onset  . Heart murmur Mother     SOCHx:   reports that he quit smoking about 2 years ago. His smoking use included cigarettes and cigars. He quit  after 35.00 years of use. He has never used smokeless tobacco. He reports that he does not drink alcohol or use drugs.  ALLERGIES:  No Known Allergies  ROS: Pertinent items noted in HPI and remainder of comprehensive ROS otherwise negative.  HOME MEDS: Current Outpatient Medications  Medication Sig Dispense Refill  . aspirin 81 MG EC tablet Take 1 tablet (81 mg total) by mouth daily. (Patient taking differently: Take 162 mg by mouth daily. )    . bisacodyl (DULCOLAX) 10 MG suppository Place 1 suppository (10 mg total) rectally daily at 6 (six) AM. 12 suppository 0  . gabapentin (NEURONTIN) 100 MG capsule Take 1 capsule (100 mg total) by mouth 3 (three) times daily. 90 capsule 3  . HYDROcodone-acetaminophen (NORCO/VICODIN) 5-325 MG tablet Take 1 tablet by mouth every 8 (eight) hours as needed for moderate pain. 45 tablet 0  . metoprolol tartrate (LOPRESSOR) 25 MG tablet Take 0.5 tablets (12.5 mg total) by mouth 2 (two) times daily. Hold dose if BP<100/60 or pulse<50 your are symptomatic 60 tablet 4  . senna-docusate (SENOKOT-S) 8.6-50 MG tablet Take 2 tablets by mouth at bedtime.    . traZODone (DESYREL) 100 MG tablet Take 1  tablet (100 mg total) by mouth at bedtime. 30 tablet 2   No current facility-administered medications for this visit.     LABS/IMAGING: No results found for this or any previous visit (from the past 48 hour(s)). No results found.  WEIGHTS: Wt Readings from Last 3 Encounters:  08/01/17 152 lb 3.2 oz (69 kg)  06/20/17 145 lb (65.8 kg)  06/08/17 143 lb (64.9 kg)    VITALS: BP 128/77   Pulse 83   Ht 5\' 7"  (1.702 m)   Wt 152 lb 3.2 oz (69 kg)   BMI 23.84 kg/m   EXAM: General appearance: alert and no distress Neck: no carotid bruit and no JVD Lungs: clear to auscultation bilaterally Heart: regular rate and rhythm, diastolic murmur: early diastolic 2/6, crescendo at 2nd right intercostal space and Prominent aortic pulsation in the anterior chest Abdomen:  soft, non-tender; bowel sounds normal; no masses,  no organomegaly Extremities: extremities normal, atraumatic, no cyanosis or edema Pulses: 2+ and symmetric Skin: Skin color, texture, turgor normal. No rashes or lesions Neurologic: Grossly normal Psych: Pleasant  EKG: Normal sinus rhythm at 83, possible left atrial enlargement-personally reviewed  ASSESSMENT: 1. Thoracic aortic dissection s/p repair by Dr. Tyrone Sage in 2016 - recent TEVAR re-repair 2018 2. Trivial aortic insufficiency 3. Ischemic leg 4. Iron deficiency anemia 5. Essential hypertension 6. Large direct inguinal hernia, unrepaired 7. Acceptable risk for upcoming surgery  PLAN: 1.   Mr. Vicario had angiographically normal coronaries in September 2018.  He had TEVAR repair of his aneurysm and unfortunately had spinal cord stroke.  Echo showed only trivial aortic insufficiency.  He is planning on upcoming hernia surgery for which she would be at low cardiovascular risk.  No further workup is necessary from my standpoint.  Follow-up with me as needed.  Chrystie Nose, MD, [facility], FACP  [place]  [facility]  Medical Director of the [facility] Lipid [facility] &  Cardiovascular Risk Reduction Clinic Diplomate of the American Board of Clinical Lipidology Attending Cardiologist  Direct Dial: [phone]  Fax: [phone]  Website:  [email or web address] 08/01/2017, 2:58 PM

## 2017-08-01 NOTE — Patient Instructions (Signed)
Your physician recommends that you schedule a follow-up appointment as needed  

## 2017-08-04 ENCOUNTER — Ambulatory Visit: Payer: Medicaid Other | Admitting: Rehabilitation

## 2017-08-05 ENCOUNTER — Ambulatory Visit: Payer: Medicaid Other | Admitting: Physical Therapy

## 2017-08-05 ENCOUNTER — Ambulatory Visit: Payer: PRIVATE HEALTH INSURANCE | Admitting: Rehabilitation

## 2017-08-11 ENCOUNTER — Encounter: Payer: Self-pay | Admitting: Physical Therapy

## 2017-08-11 ENCOUNTER — Ambulatory Visit: Payer: Medicaid Other | Admitting: Physical Therapy

## 2017-08-11 DIAGNOSIS — G9511 Acute infarction of spinal cord (embolic) (nonembolic): Secondary | ICD-10-CM | POA: Diagnosis not present

## 2017-08-11 DIAGNOSIS — R293 Abnormal posture: Secondary | ICD-10-CM

## 2017-08-11 DIAGNOSIS — R2681 Unsteadiness on feet: Secondary | ICD-10-CM

## 2017-08-11 DIAGNOSIS — G8222 Paraplegia, incomplete: Secondary | ICD-10-CM

## 2017-08-11 DIAGNOSIS — R2689 Other abnormalities of gait and mobility: Secondary | ICD-10-CM

## 2017-08-11 DIAGNOSIS — M6281 Muscle weakness (generalized): Secondary | ICD-10-CM

## 2017-08-11 NOTE — Patient Instructions (Signed)
Heel Slides    Squeeze pelvic floor and hold. Slide left heel along bed towards bottom. Hold for _2-3__ seconds. Slide back to flat knee position. Repeat _10__ times. Do _1-2__ times a day. Repeat with other leg. Use strap if needed to help your leg bend, but use as much muscle strength as you can. Also have family assist as needed to make sure knee stays in line with body.    Copyright  VHI. All rights reserved.   Bridging    Slowly raise buttocks from floor, keeping stomach tight. Repeat _10___ times per set. Do __1__ sets per session. Do __2__ sessions per day. Use your hands lightly to hold legs in place, but fight to keep legs in line with shoulders as much as you can with the muscles in your leg.   http://orth.exer.us/1096  Copyright  VHI. All rights reserved.   Drop / Hold    Do this with countertop in front of you.  Feet shoulder width apart, relax leg muscles and drop into small squat. Stop the downward motion and hold a squat _2-3__ seconds. Repeat _10__ times per set. Do 1-2 times per day. Have a chair behind you for safety in case of loss of balance or fatigue.   http://plyo.exer.us/69   Copyright  VHI. All rights reserved.    Side-Stepping    Do this at counter top for support.  Walk to left side with eyes open. Take even steps, leading with same foot. Make sure each foot lifts off the floor. Repeat in opposite direction. Repeat for 4 laps down and back. Do __2__ sessions per day.    SINGLE LIMB STANCE    Holding on to [employer] Top with both hands: Stance: single leg on floor. Raise leg. Hold _10__ seconds. Repeat with other leg. _3__ reps per set   FUNCTIONAL MOBILITY: Marching - Standing    Holding Counter top: March in place by lifting left leg up, then right. Alternate. __10_ reps per set

## 2017-08-11 NOTE — Therapy (Signed)
[facility] [address] [place], [place], [address] Phone: [phone]   Fax:  [phone]  Physical Therapy Treatment  Patient Details  Name: Victor [name] MRN: [id number] Date of Birth: 12/24/1955 Referring Provider: Alger Simons, MD   Encounter Date: 08/11/2017  PT End of Session - 08/11/17 1207    Visit Number  15 updating error    Number of Visits  21 12 visits approved by Medicaid beginning 07-14-17    Date for PT Re-Evaluation  08/24/17 updated based on approval    Authorization Type  Medicaid (12 approved from 3/21-08/24/17    Authorization Time Period  3-21 - 08-24-17    Authorization - Visit Number  10 updated error    Authorization - Number of Visits  12    PT Start Time  1100    PT Stop Time  1150    PT Time Calculation (min)  50 min    Activity Tolerance  Patient tolerated treatment well    Behavior During Therapy  [facility] for tasks assessed/performed       Past Medical History:  Diagnosis Date  . History of kidney stones   . Hypertension   . Spinal cord infarction ([place]) 02/2017  . Stroke ([place])   . Thoracic aortic aneurysm ([place]) 09/2014    Past Surgical History:  Procedure Laterality Date  . ASCENDING AORTIC ROOT REPLACEMENT N/A 01/21/2017   Procedure: Replacement of Aortic Arch with circulatory arrest;  Surgeon: Grace Isaac, MD;  Location: [place];  Service: Open Heart Surgery;  Laterality: N/A;  . CARDIAC CATHETERIZATION    . FEMORAL-FEMORAL BYPASS GRAFT N/A 10/07/2014   Procedure: LEFT  FEMORAL ARTERY -RIGHT FEMORAL ARTERY BYPASS GRAFT USING 8MM X 30 CM HEMASHIELD GOLD GRAFT;  Surgeon: Rosetta Posner, MD;  Location: [place];  Service: Vascular;  Laterality: N/A;  . RIGHT/LEFT HEART CATH AND CORONARY ANGIOGRAPHY N/A 01/11/2017   Procedure: RIGHT/LEFT HEART CATH AND CORONARY ANGIOGRAPHY- Diagnostic Only;  Surgeon: Sherren Mocha, MD;  Location: [place] [facility];  Service: Cardiovascular;  Laterality:  N/A;  . s/p thoracic aneurysm repair    . STERNOTOMY N/A 01/21/2017   Procedure: REDO STERNOTOMY;  Surgeon: Grace Isaac, MD;  Location: [place];  Service: Open Heart Surgery;  Laterality: N/A;  . TEE WITHOUT CARDIOVERSION N/A 01/21/2017   Procedure: TRANSESOPHAGEAL ECHOCARDIOGRAM (TEE);  Surgeon: Grace Isaac, MD;  Location: [place];  Service: Open Heart Surgery;  Laterality: N/A;  . THORACIC AORTIC ANEURYSM REPAIR N/A 10/06/2014   Procedure: THORACIC ASCENDING ANEURYSM REPAIR (AAA);  Surgeon: Grace Isaac, MD;  Location: [place];  Service: Open Heart Surgery;  Laterality: N/A;  hyportermia circulatory arrest and resuspension of aortic valve  . THORACIC AORTIC ENDOVASCULAR STENT GRAFT N/A 01/21/2017   Procedure: THORACIC AORTIC ENDOVASCULAR STENT GRAFT;  Surgeon: Serafina Mitchell, MD;  Location: [place];  Service: Vascular;  Laterality: N/A;  . THROMBECTOMY ILIAC ARTERY Left 03/08/2017   Procedure: THROMBECTOMY of Left subclavian artery;  Surgeon: Angelia Mould, MD;  Location: [place];  Service: Vascular;  Laterality: Left;  Marland Kitchen VASCULAR SURGERY      There were no vitals filed for this visit.  Subjective Assessment - 08/11/17 1105    Subjective  Bought a new pair of shoes to put his AFO and foot up brace in but it was too tight and didnt have time to put it back in old shoes.  Was not aware that he could take  insole out to improve fit.    Patient is accompained by:  Family member    Pertinent History  Hx of aortic arch replacement and bypass to CCA and L subclavian a. 01/21/17, unsuccessful thrombectomy L bracial a on 03/08/17    Limitations  House hold activities;Walking    How long can you walk comfortably?  approx 30-50' with RW    Patient Stated Goals  "I want to be able to walk"     Currently in Pain?  No/denies    Pain Onset  More than a month ago                       [facility] Adult PT Treatment/Exercise - 08/11/17 1159      Ambulation/Gait    Ambulation/Gait  Yes    Ambulation/Gait Assistance  5: Supervision    Ambulation/Gait Assistance Details  with AFO in L shoe and foot up brace R shoe    Ambulation Distance (Feet)  200 Feet    Assistive device  Rolling walker    Gait Pattern  Step-through pattern;Decreased step length - right;Decreased step length - left;Decreased stride length;Decreased hip/knee flexion - left;Decreased dorsiflexion - left;Trunk flexed    Ambulation Surface  Unlevel;Level;Indoor;Outdoor;Paved      Therapeutic Activites    Therapeutic Activities  Work [employer];Other Therapeutic Activities    Work Simulation  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  Researched adjustable bar stools with back support and provided pt with purchasing information    Other Therapeutic Activities  Reviewed sequence for donning AFO and foot up brace - use of patient's phone camera to photograph placement of foot up brace to allow pt and mother to replicate at home with new shoes      Knee/Hip Exercises: Standing   Hip Flexion  Stengthening;Right;Left;1 set;10 reps;Knee bent cued "knee to cabinet"    SLS  3 reps each LE with bilat > unilat UE support following marching - 6-8 second hold each LE             PT Education - 08/11/17 1206    Education provided  Yes    Education Details  see TA; updated HEP, two more visits approved     Person(s) Educated  Patient;Parent(s)    Methods  Explanation;Demonstration;Handout    Comprehension  Verbalized understanding;Returned demonstration       PT Short Term Goals - 07/04/17 1339      PT SHORT TERM GOAL #1   Title  Pt will initiate HEP in order to indicate improved functional mobility and decreased fall risk.  (Target Date: 07/02/17)    Baseline  met, has initiated but not consistently performing    Time  4    Period  Weeks    Status  Achieved       PT SHORT TERM GOAL #2   Title  Will improve gait velocity by .60 ft/sec in order to indicate improved efficiency of gait and decreased fall risk.     Baseline  .54 ft/sec with RW and no AFO; .80 with RW and trial AFO; .75 ft/sec with RW plastic AFO on LLE, foot up brace on RLE    Time  4    Period  Weeks    Status  Not Met      PT SHORT TERM GOAL #3  Title  Pt will improve TUG to </=40 secs w/ LRAD in order to indicate decreased fall risk.      Baseline  37 secs on 07/04/17    Time  4    Period  Weeks    Status  Achieved      PT SHORT TERM GOAL #4   Title  Pt will ambulate 100' w/ LRAD at S level in order to indicate safe negotiation around home.     Baseline  met 07/04/17    Time  4    Period  Days    Status  Achieved      PT SHORT TERM GOAL #5   Title  Pt will ascend/descend 3 steps with handrails at S level in order to indicate safety with home/community entry.     Baseline  needs min A to ascend, S to descend    Time  4    Period  Weeks    Status  Partially Met        PT Long Term Goals - 08/11/17 1118      PT LONG TERM GOAL #1   Title  Pt will be independent with HEP in order to indicate improved functional mobility and decreased fall risk.  (Target Date: 08/01/17)    Baseline  dependent     Time  8    Period  Weeks    Status  On-going    Target Date  08/24/17      PT LONG TERM GOAL #2   Title  Pt will improve gait speed by 1.20 ft/sec from baseline in order to indicate decreased fall risk.     Baseline  .75 ft/sec at baseline with RW, no AFO, 1.47 ft/sec     Time  8    Period  Weeks    Status  Achieved      PT LONG TERM GOAL #3   Title  Pt will perform TUG </=30 secs w/ LRAD in order to indicate decreased fall risk.      Baseline  51.16 secs with RW at baseline to 23.28 secs with RW (L AFO, R foot up brace) on 07/28/17    Time  8    Period  Weeks    Status  Achieved      PT LONG TERM GOAL #4   Title  Pt will ambulate 300' w/ LRAD over indoor surfaces at  mod I level in order to indicate safe negotation in home.     Baseline  40' w/ RW at min A level     Time  8    Period  Weeks    Status  On-going      PT LONG TERM GOAL #5   Title  Pt will ambulate 200' w/ LRAD over paved outdoor surfaces at S level in order to indicate pt slow return to community gait.      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  Progressed standing HEP.  Will continue to progress towards LTG.    Rehab Potential  Good    PT Frequency  1x / week    PT Duration  3 weeks    PT Treatment/Interventions  ADLs/Self Care Home Management;Electrical Stimulation;DME Instruction;Gait training;Stair training;Functional mobility training;Therapeutic activities;Therapeutic exercise;Balance training;Neuromuscular re-education;Patient/family education;Orthotic Fit/Training;Vestibular    PT Next Visit Plan  2 more visits; continue to progress gait/balance towards LTG.  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    Consulted and Agree with Plan of Care  Patient;Family member/caregiver       Patient will benefit from skilled therapeutic intervention in order to improve the following deficits and impairments:  Abnormal gait, Cardiopulmonary status limiting activity, Decreased activity tolerance, Decreased balance, Decreased endurance, Decreased knowledge of use of DME, Decreased mobility, Decreased strength, Impaired perceived  functional ability, Impaired flexibility, Postural dysfunction, Impaired tone  Visit Diagnosis: Paraplegia, incomplete (HCC)  Unsteadiness on feet  Muscle weakness (generalized)  Other abnormalities of gait and mobility  Abnormal posture     Problem List Patient Active Problem List   Diagnosis Date Noted  . Unilateral inguinal hernia without obstruction or gangrene 06/29/2017  . Nerve pain 05/18/2017  . Spinal cord infarction ([place]) 03/15/2017  . Paraplegia ([place]) 03/07/2017  . Bilateral leg weakness 03/07/2017  . S/P thoracic aortic aneurysm repair 01/21/2017  . Coronary artery calcification 12/23/2016  . S/P aortic dissection repair 12/23/2016  . Thoracic aortic aneurysm without rupture ([place]) 12/23/2016  . Preoperative cardiovascular examination 12/23/2016  . Essential hypertension 09/23/2015  . Iron deficiency anemia 01/28/2015  . Weight loss 10/30/2014  . Hospital-acquired pneumonia 10/09/2014  . Ischemic leg 10/09/2014  . Aortic dissection ([place]) 10/07/2014    Rico Junker, PT, DPT 08/11/17    12:13 PM    [facility] [address] [place], [place], [address] Phone: [phone]   Fax:  [phone]  Name: KIMO BANCROFT MRN: [id number] Date of Birth: Jul 27, 1955

## 2017-08-15 ENCOUNTER — Ambulatory Visit: Payer: Medicaid Other | Admitting: Rehabilitation

## 2017-08-15 ENCOUNTER — Encounter: Payer: Self-pay | Admitting: Rehabilitation

## 2017-08-15 DIAGNOSIS — R293 Abnormal posture: Secondary | ICD-10-CM

## 2017-08-15 DIAGNOSIS — G8222 Paraplegia, incomplete: Secondary | ICD-10-CM

## 2017-08-15 DIAGNOSIS — R2689 Other abnormalities of gait and mobility: Secondary | ICD-10-CM

## 2017-08-15 DIAGNOSIS — M6281 Muscle weakness (generalized): Secondary | ICD-10-CM

## 2017-08-15 DIAGNOSIS — G9511 Acute infarction of spinal cord (embolic) (nonembolic): Secondary | ICD-10-CM | POA: Diagnosis not present

## 2017-08-15 DIAGNOSIS — R2681 Unsteadiness on feet: Secondary | ICD-10-CM

## 2017-08-15 NOTE — Therapy (Signed)
[facility] [address] [place], [place], [address] Phone: [phone]   Fax:  [phone]  Physical Therapy Treatment  Patient Details  Name: Victor [name] MRN: [id number] Date of Birth: 05-01-1955 Referring Provider: Alger Simons, MD   Encounter Date: 08/15/2017  PT End of Session - 08/15/17 1627    Visit Number  16 updating error    Number of Visits  21 12 visits approved by Medicaid beginning 07-14-17    Date for PT Re-Evaluation  08/24/17 updated based on approval    Authorization Type  Medicaid (12 approved from 3/21-08/24/17    Authorization Time Period  3-21 - 08-24-17    Authorization - Visit Number  11 updated error    Authorization - Number of Visits  12    PT Start Time  1287    PT Stop Time  1700    PT Time Calculation (min)  43 min    Activity Tolerance  Patient tolerated treatment well    Behavior During Therapy  [facility] for tasks assessed/performed       Past Medical History:  Diagnosis Date  . History of kidney stones   . Hypertension   . Spinal cord infarction ([place]) 02/2017  . Stroke ([place])   . Thoracic aortic aneurysm ([place]) 09/2014    Past Surgical History:  Procedure Laterality Date  . ASCENDING AORTIC ROOT REPLACEMENT N/A 01/21/2017   Procedure: Replacement of Aortic Arch with circulatory arrest;  Surgeon: Grace Isaac, MD;  Location: [place];  Service: Open Heart Surgery;  Laterality: N/A;  . CARDIAC CATHETERIZATION    . FEMORAL-FEMORAL BYPASS GRAFT N/A 10/07/2014   Procedure: LEFT  FEMORAL ARTERY -RIGHT FEMORAL ARTERY BYPASS GRAFT USING 8MM X 30 CM HEMASHIELD GOLD GRAFT;  Surgeon: Rosetta Posner, MD;  Location: [place];  Service: Vascular;  Laterality: N/A;  . RIGHT/LEFT HEART CATH AND CORONARY ANGIOGRAPHY N/A 01/11/2017   Procedure: RIGHT/LEFT HEART CATH AND CORONARY ANGIOGRAPHY- Diagnostic Only;  Surgeon: Sherren Mocha, MD;  Location: [place] CV LAB;  Service: Cardiovascular;  Laterality:  N/A;  . s/p thoracic aneurysm repair    . STERNOTOMY N/A 01/21/2017   Procedure: REDO STERNOTOMY;  Surgeon: Grace Isaac, MD;  Location: [place];  Service: Open Heart Surgery;  Laterality: N/A;  . TEE WITHOUT CARDIOVERSION N/A 01/21/2017   Procedure: TRANSESOPHAGEAL ECHOCARDIOGRAM (TEE);  Surgeon: Grace Isaac, MD;  Location: [place];  Service: Open Heart Surgery;  Laterality: N/A;  . THORACIC AORTIC ANEURYSM REPAIR N/A 10/06/2014   Procedure: THORACIC ASCENDING ANEURYSM REPAIR (AAA);  Surgeon: Grace Isaac, MD;  Location: [place];  Service: Open Heart Surgery;  Laterality: N/A;  hyportermia circulatory arrest and resuspension of aortic valve  . THORACIC AORTIC ENDOVASCULAR STENT GRAFT N/A 01/21/2017   Procedure: THORACIC AORTIC ENDOVASCULAR STENT GRAFT;  Surgeon: Serafina Mitchell, MD;  Location: [place];  Service: Vascular;  Laterality: N/A;  . THROMBECTOMY ILIAC ARTERY Left 03/08/2017   Procedure: THROMBECTOMY of Left subclavian artery;  Surgeon: Angelia Mould, MD;  Location: [place];  Service: Vascular;  Laterality: Left;  Marland Kitchen VASCULAR SURGERY      There were no vitals filed for this visit.  Subjective Assessment - 08/15/17 1624    Subjective  Has another pair of shoes since last visit.  He was able to put foot up brace in shoe correctly.  Some pain in L forefoot wearing AFO.  PT suggest he make an appt with [facility] to see  if it can be modfiied.     Pertinent History  Hx of aortic arch replacement and bypass to CCA and L subclavian a. 01/21/17, unsuccessful thrombectomy L bracial a on 03/08/17    Limitations  House hold activities;Walking    How long can you walk comfortably?  approx 30-50' with RW    Patient Stated Goals  "I want to be able to walk"                        [facility] Adult PT Treatment/Exercise - 08/15/17 1648      Ambulation/Gait   Ambulation/Gait  Yes    Ambulation/Gait Assistance  6: Modified independent (Device/Increase time)    Ambulation/Gait  Assistance Details  W/ L AFO and R foot up brace.  Pt able to ambulate at mod I level with RW.      Ambulation Distance (Feet)  115 Feet  x 2 reps, 80' x 1 rep    Assistive device  Rolling walker    Gait Pattern  Step-through pattern;Decreased step length - right;Decreased step length - left;Decreased stride length;Decreased hip/knee flexion - left;Decreased dorsiflexion - left;Trunk flexed    Ambulation Surface  Level;Indoor    Stairs  Yes    Stairs Assistance  5: Supervision    Stairs Assistance Details (indicate cue type and reason)  Pt able to demonstrate to PT how he is able to negotiate stairs with use of rail on one side and RW on the opposite side (folded) in step to fashion at S level today.  Continue to recommend he do this or leave one RW upstairs and one downstairs to ensure he has support of RW at all times when ambulating.       Neuro Re-ed    Neuro Re-ed Details   Performed standing NMR at counter top for BLE strength with standing hip flex with alternating UE support x 10 reps, backwards ambulation along counter top and with PT assist on opposite side with cues for posture and increased L knee flex when retro stepping.  Performed 4 laps down and back.  Gait with BUE support on PTs forearms x 100' with cues for relaxed shoulders, decreased UE support, improved forward translation and improved hip protraction bilaterally.  Pt able to correct intermittently, but fatigues and needs cues often.  Transitioned to // bars stepping over small beam with forward translation onto stance leg with emphasis on hip extension x 10 reps on each side.  Pt able to do very well therefore continued with gait x 80'.  Then transitioned to gait with quad tip cane for minor support with HHA on opposite site with cues for sequencing and technique along with posture and improved forward movement wtih less lateral movement and trunk rotation. Pt making good progress.        Knee/Hip Exercises: Aerobic   Nustep   Level 4 with BLEs mostly with intermittent use of BUEs x 5 mins                PT Short Term Goals - 07/04/17 1339      PT SHORT TERM GOAL #1   Title  Pt will initiate HEP in order to indicate improved functional mobility and decreased fall risk.  (Target Date: 07/02/17)    Baseline  met, has initiated but not consistently performing    Time  4    Period  Weeks    Status  Achieved      PT  SHORT TERM GOAL #2   Title  Will improve gait velocity by .60 ft/sec in order to indicate improved efficiency of gait and decreased fall risk.     Baseline  .71 ft/sec with RW and no AFO; .80 with RW and trial AFO; .75 ft/sec with RW plastic AFO on LLE, foot up brace on RLE    Time  4    Period  Weeks    Status  Not Met      PT SHORT TERM GOAL #3   Title  Pt will improve TUG to </=40 secs w/ LRAD in order to indicate decreased fall risk.      Baseline  37 secs on 07/04/17    Time  4    Period  Weeks    Status  Achieved      PT SHORT TERM GOAL #4   Title  Pt will ambulate 100' w/ LRAD at S level in order to indicate safe negotiation around home.     Baseline  met 07/04/17    Time  4    Period  Days    Status  Achieved      PT SHORT TERM GOAL #5   Title  Pt will ascend/descend 3 steps with handrails at S level in order to indicate safety with home/community entry.     Baseline  needs min A to ascend, S to descend    Time  4    Period  Weeks    Status  Partially Met        PT Long Term Goals - 08/11/17 1118      PT LONG TERM GOAL #1   Title  Pt will be independent with HEP in order to indicate improved functional mobility and decreased fall risk.  (Target Date: 08/01/17)    Baseline  dependent     Time  8    Period  Weeks    Status  On-going    Target Date  08/24/17      PT LONG TERM GOAL #2   Title  Pt will improve gait speed by 1.20 ft/sec from baseline in order to indicate decreased fall risk.     Baseline  .75 ft/sec at baseline with RW, no AFO, 1.47 ft/sec     Time  8     Period  Weeks    Status  Achieved      PT LONG TERM GOAL #3   Title  Pt will perform TUG </=30 secs w/ LRAD in order to indicate decreased fall risk.      Baseline  51.16 secs with RW at baseline to 23.28 secs with RW (L AFO, R foot up brace) on 07/28/17    Time  8    Period  Weeks    Status  Achieved      PT LONG TERM GOAL #4   Title  Pt will ambulate 300' w/ LRAD over indoor surfaces at mod I level in order to indicate safe negotation in home.     Baseline  40' w/ RW at min A level     Time  8    Period  Weeks    Status  On-going      PT LONG TERM GOAL #5   Title  Pt will ambulate 200' w/ LRAD over paved outdoor surfaces at S level in order to indicate pt slow return to community gait.      Baseline  200' over uneven pavement with RW and AFO; supervision  Time  8    Period  Weeks    Status  Achieved            Plan - 08/15/17 1719    Clinical Impression Statement  Skilled session focused on NMR for BLE strengthening and improved hip activation during stance, esp increased hip protraction.  Pt making steady progress however he continues to spend much of his day in bed or sitting.  Continue to encourage he go to [facility] as therapy will be ending next week.  Pt verbalized understanding.      Rehab Potential  Good    PT Frequency  1x / week    PT Duration  3 weeks    PT Treatment/Interventions  ADLs/Self Care Home Management;Electrical Stimulation;DME Instruction;Gait training;Stair training;Functional mobility training;Therapeutic activities;Therapeutic exercise;Balance training;Neuromuscular re-education;Patient/family education;Orthotic Fit/Training;Vestibular    PT Next Visit Plan  1 more visits; continue to progress gait/balance towards LTG.  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    Consulted and Agree with Plan of Care  Patient;Family member/caregiver       Patient will benefit from skilled  therapeutic intervention in order to improve the following deficits and impairments:  Abnormal gait, Cardiopulmonary status limiting activity, Decreased activity tolerance, Decreased balance, Decreased endurance, Decreased knowledge of use of DME, Decreased mobility, Decreased strength, Impaired perceived functional ability, Impaired flexibility, Postural dysfunction, Impaired tone  Visit Diagnosis: Paraplegia, incomplete (HCC)  Unsteadiness on feet  Muscle weakness (generalized)  Other abnormalities of gait and mobility  Abnormal posture     Problem List Patient Active Problem List   Diagnosis Date Noted  . Unilateral inguinal hernia without obstruction or gangrene 06/29/2017  . Nerve pain 05/18/2017  . Spinal cord infarction ([place]) 03/15/2017  . Paraplegia ([place]) 03/07/2017  . Bilateral leg weakness 03/07/2017  . S/P thoracic aortic aneurysm repair 01/21/2017  . Coronary artery calcification 12/23/2016  . S/P aortic dissection repair 12/23/2016  . Thoracic aortic aneurysm without rupture ([place]) 12/23/2016  . Preoperative cardiovascular examination 12/23/2016  . Essential hypertension 09/23/2015  . Iron deficiency anemia 01/28/2015  . Weight loss 10/30/2014  . Hospital-acquired pneumonia 10/09/2014  . Ischemic leg 10/09/2014  . Aortic dissection ([place]) 10/07/2014    Cameron Sprang, PT, MPT [facility] [address] [place], [place], [address] Phone: [phone]   Fax:  [phone] 08/15/17, 5:22 PM  Name: Victor [name] MRN: [id number] Date of Birth: 22-Mar-1956

## 2017-08-22 ENCOUNTER — Ambulatory Visit: Payer: Medicaid Other | Admitting: Rehabilitation

## 2017-08-22 ENCOUNTER — Encounter: Payer: Self-pay | Admitting: Rehabilitation

## 2017-08-22 DIAGNOSIS — R293 Abnormal posture: Secondary | ICD-10-CM

## 2017-08-22 DIAGNOSIS — R2689 Other abnormalities of gait and mobility: Secondary | ICD-10-CM

## 2017-08-22 DIAGNOSIS — M6281 Muscle weakness (generalized): Secondary | ICD-10-CM

## 2017-08-22 DIAGNOSIS — G8222 Paraplegia, incomplete: Secondary | ICD-10-CM

## 2017-08-22 DIAGNOSIS — R2681 Unsteadiness on feet: Secondary | ICD-10-CM

## 2017-08-22 DIAGNOSIS — G9511 Acute infarction of spinal cord (embolic) (nonembolic): Secondary | ICD-10-CM | POA: Diagnosis not present

## 2017-08-22 NOTE — Therapy (Signed)
[facility] [address] [place], [place], [address] Phone: [phone]   Fax:  [phone]  Physical Therapy Treatment  Patient Details  Name: Victor [name] MRN: [id number] Date of Birth: [birth year] Referring Provider: Alger Simons, MD   Encounter Date: 08/22/2017  PT End of Session - 08/22/17 1654    Visit Number  17 updating error    Number of Visits  21 12 visits approved by Medicaid beginning 07-14-17    Date for PT Re-Evaluation  08/24/17 updated based on approval    Authorization Type  Medicaid (12 approved from 3/21-08/24/17    Authorization Time Period  3-21 - 08-24-17    Authorization - Visit Number  12 updated error    Authorization - Number of Visits  12    PT Start Time  1102    PT Stop Time  1143    PT Time Calculation (min)  41 min    Activity Tolerance  Patient tolerated treatment well    Behavior During Therapy  [facility] for tasks assessed/performed       Past Medical History:  Diagnosis Date  . History of kidney stones   . Hypertension   . Spinal cord infarction ([place]) 02/2017  . Stroke ([place])   . Thoracic aortic aneurysm ([place]) 09/2014    Past Surgical History:  Procedure Laterality Date  . ASCENDING AORTIC ROOT REPLACEMENT N/A 01/21/2017   Procedure: Replacement of Aortic Arch with circulatory arrest;  Surgeon: Grace Isaac, MD;  Location: [facility];  Service: Open Heart Surgery;  Laterality: N/A;  . CARDIAC CATHETERIZATION    . FEMORAL-FEMORAL BYPASS GRAFT N/A 10/07/2014   Procedure: LEFT  FEMORAL ARTERY -RIGHT FEMORAL ARTERY BYPASS GRAFT USING 8MM X 30 CM HEMASHIELD GOLD GRAFT;  Surgeon: Rosetta Posner, MD;  Location: [facility];  Service: Vascular;  Laterality: N/A;  . RIGHT/LEFT HEART CATH AND CORONARY ANGIOGRAPHY N/A 01/11/2017   Procedure: RIGHT/LEFT HEART CATH AND CORONARY ANGIOGRAPHY- Diagnostic Only;  Surgeon: Sherren Mocha, MD;  Location: [facility];  Service: Cardiovascular;  Laterality:  N/A;  . s/p thoracic aneurysm repair    . STERNOTOMY N/A 01/21/2017   Procedure: REDO STERNOTOMY;  Surgeon: Grace Isaac, MD;  Location: [facility];  Service: Open Heart Surgery;  Laterality: N/A;  . TEE WITHOUT CARDIOVERSION N/A 01/21/2017   Procedure: TRANSESOPHAGEAL ECHOCARDIOGRAM (TEE);  Surgeon: Grace Isaac, MD;  Location: [facility];  Service: Open Heart Surgery;  Laterality: N/A;  . THORACIC AORTIC ANEURYSM REPAIR N/A 10/06/2014   Procedure: THORACIC ASCENDING ANEURYSM REPAIR (AAA);  Surgeon: Grace Isaac, MD;  Location: [facility];  Service: Open Heart Surgery;  Laterality: N/A;  hyportermia circulatory arrest and resuspension of aortic valve  . THORACIC AORTIC ENDOVASCULAR STENT GRAFT N/A 01/21/2017   Procedure: THORACIC AORTIC ENDOVASCULAR STENT GRAFT;  Surgeon: Serafina Mitchell, MD;  Location: [facility];  Service: Vascular;  Laterality: N/A;  . THROMBECTOMY ILIAC ARTERY Left 03/08/2017   Procedure: THROMBECTOMY of Left subclavian artery;  Surgeon: Angelia Mould, MD;  Location: [facility];  Service: Vascular;  Laterality: Left;  [facility] VASCULAR SURGERY      There were no vitals filed for this visit.  Subjective Assessment - 08/22/17 1106    Subjective  Pt reports he is not wearing the brace at home, only in therapy due to pain at ball of foot.  continue to recommend he make appt with  [facility] to address.     Patient is accompained  by:  Family member    Pertinent History  Hx of aortic arch replacement and bypass to CCA and L subclavian a. 01/21/17, unsuccessful thrombectomy L bracial a on 03/08/17    Limitations  House hold activities;Walking    How long can you walk comfortably?  approx 30-50' with RW    Patient Stated Goals  "I want to be able to walk"     Currently in Pain?  Yes    Pain Score  3     Pain Location  Back    Pain Orientation  Lower    Pain Descriptors / Indicators  Aching    Pain Type  Chronic pain    Pain Onset  More than a month ago    Pain Frequency  Intermittent     Aggravating Factors   laying too much    Pain Relieving Factors  walking                       [facility] Adult PT Treatment/Exercise - 08/22/17 0001      Ambulation/Gait   Ambulation/Gait  Yes    Ambulation/Gait Assistance  6: Modified independent (Device/Increase time)    Ambulation/Gait Assistance Details  Pt able to ambulate over varying indoor surfaces w/ RW with L AFO and R foot up brace at mod I level during session.  Pt with mild R toe drag, however no LOB during session.      Ambulation Distance (Feet)  300 Feet    Assistive device  Rolling walker    Gait Pattern  Step-through pattern;Decreased step length - right;Decreased step length - left;Decreased stride length;Decreased hip/knee flexion - left;Decreased dorsiflexion - left;Trunk flexed    Ambulation Surface  Level;Indoor    Stairs  Yes    Stairs Assistance  4: Min assist    Stairs Assistance Details (indicate cue type and reason)  Had pt attempt stairs today with single rail, however due to height of step and lack of support, pt needs assist to elevate RLE to step.  Able to descend at mod I level.       Self-Care   Self-Care  Other Self-Care Comments    Other Self-Care Comments   Continue to encourage increased activity at home.  Note he sees Dr. Leonie Man in the next few weeks and recommend he mention to him about urinary frequency at night, leg pain at night and inability to sleep at night.  Feel that these factors are impeding his progress as he is unable to get on normal daily schedule.        Therapeutic Activites    Therapeutic Activities  Other Therapeutic Activities    Other Therapeutic Activities  Went over floor recovery during session as pt continues to ambulate at home downstairs without AD and uses walls for support.  Feel that his fall risk is very high and therefore went over floor recovery.  Pt able to perform at min/guard A today with cues for safe technique and utilizing RLE as much as possible when  elevating to mat.        Neuro Re-ed    Neuro Re-ed Details   Performed standing mini squats and SLS as in HEP, see pt instruction for details.  Cues for posture with SLS and improved hip protraction.               PT Education - 08/22/17 1654    Education provided  Yes    Education Details  see  self care    Person(s) Educated  Patient;Parent(s)    Methods  Explanation    Comprehension  Verbalized understanding       PT Short Term Goals - 07/04/17 1339      PT SHORT TERM GOAL #1   Title  Pt will initiate HEP in order to indicate improved functional mobility and decreased fall risk.  (Target Date: 07/02/17)    Baseline  met, has initiated but not consistently performing    Time  4    Period  Weeks    Status  Achieved      PT SHORT TERM GOAL #2   Title  Will improve gait velocity by .60 ft/sec in order to indicate improved efficiency of gait and decreased fall risk.     Baseline  .38 ft/sec with RW and no AFO; .80 with RW and trial AFO; .75 ft/sec with RW plastic AFO on LLE, foot up brace on RLE    Time  4    Period  Weeks    Status  Not Met      PT SHORT TERM GOAL #3   Title  Pt will improve TUG to </=40 secs w/ LRAD in order to indicate decreased fall risk.      Baseline  37 secs on 07/04/17    Time  4    Period  Weeks    Status  Achieved      PT SHORT TERM GOAL #4   Title  Pt will ambulate 100' w/ LRAD at S level in order to indicate safe negotiation around home.     Baseline  met 07/04/17    Time  4    Period  Days    Status  Achieved      PT SHORT TERM GOAL #5   Title  Pt will ascend/descend 3 steps with handrails at S level in order to indicate safety with home/community entry.     Baseline  needs min A to ascend, S to descend    Time  4    Period  Weeks    Status  Partially Met        PT Long Term Goals - 08/22/17 1110      PT LONG TERM GOAL #1   Title  Pt will be independent with HEP in order to indicate improved functional mobility and decreased  fall risk.  (Target Date: 08/01/17)    Baseline  met 08/22/17    Time  8    Period  Weeks    Status  Achieved      PT LONG TERM GOAL #2   Title  Pt will improve gait speed by 1.20 ft/sec from baseline in order to indicate decreased fall risk.     Baseline  .75 ft/sec at baseline with RW, no AFO, 1.47 ft/sec     Time  8    Period  Weeks    Status  Achieved      PT LONG TERM GOAL #3   Title  Pt will perform TUG </=30 secs w/ LRAD in order to indicate decreased fall risk.      Baseline  51.16 secs with RW at baseline to 23.28 secs with RW (L AFO, R foot up brace) on 07/28/17    Time  8    Period  Weeks    Status  Achieved      PT LONG TERM GOAL #4   Title  Pt will ambulate 300' w/ LRAD over indoor surfaces  at mod I level in order to indicate safe negotation in home.     Baseline  40' w/ RW at min A level, mod I 300' with RW and LAFO, R foot up brace 08/22/17    Time  8    Period  Weeks    Status  Achieved      PT LONG TERM GOAL #5   Title  Pt will ambulate 200' w/ LRAD over paved outdoor surfaces at S level in order to indicate pt slow return to community gait.      Baseline  200' over uneven pavement with RW and AFO; supervision    Time  8    Period  Weeks    Status  Achieved            Plan - 08/22/17 1655    Clinical Impression Statement  Skilled session focused on addressing remaining goals.  Pt with questions regarding two HEP exercises, otherwise reports doing them at home.  Also met goal for 300' ambulation with RW.  Continues to have pain from brace and PT continues to recommend he reach out to [facility] to have adjustments made.     Rehab Potential  Good    PT Frequency  1x / week    PT Duration  3 weeks    PT Treatment/Interventions  ADLs/Self Care Home Management;Electrical Stimulation;DME Instruction;Gait training;Stair training;Functional mobility training;Therapeutic activities;Therapeutic exercise;Balance training;Neuromuscular re-education;Patient/family  education;Orthotic Fit/Training;Vestibular    Consulted and Agree with Plan of Care  Patient;Family member/caregiver       Patient will benefit from skilled therapeutic intervention in order to improve the following deficits and impairments:  Abnormal gait, Cardiopulmonary status limiting activity, Decreased activity tolerance, Decreased balance, Decreased endurance, Decreased knowledge of use of DME, Decreased mobility, Decreased strength, Impaired perceived functional ability, Impaired flexibility, Postural dysfunction, Impaired tone  Visit Diagnosis: Paraplegia, incomplete (HCC)  Unsteadiness on feet  Muscle weakness (generalized)  Other abnormalities of gait and mobility  Abnormal posture     Problem List Patient Active Problem List   Diagnosis Date Noted  . Unilateral inguinal hernia without obstruction or gangrene 06/29/2017  . Nerve pain 05/18/2017  . Spinal cord infarction ([place]) 03/15/2017  . Paraplegia ([place]) 03/07/2017  . Bilateral leg weakness 03/07/2017  . S/P thoracic aortic aneurysm repair 01/21/2017  . Coronary artery calcification 12/23/2016  . S/P aortic dissection repair 12/23/2016  . Thoracic aortic aneurysm without rupture ([place]) 12/23/2016  . Preoperative cardiovascular examination 12/23/2016  . Essential hypertension 09/23/2015  . Iron deficiency anemia 01/28/2015  . Weight loss 10/30/2014  . Hospital-acquired pneumonia 10/09/2014  . Ischemic leg 10/09/2014  . Aortic dissection ([place]) 10/07/2014   Cameron Sprang, PT, MPT [facility] [address] [place], [place], [address] Phone: [phone]   Fax:  [phone] 08/22/17, 4:58 PM  Name: Victor [name] MRN: [id number] Date of Birth: 11-02-1955

## 2017-08-22 NOTE — Patient Instructions (Signed)
   Do this with countertop in front of you.Feet shoulder width apart, relax leg muscles and dropinto small squat. Stop the downward motion and hold a squat _2-3__ seconds. Repeat _10__ times per set.Do 1-2 times per day. Have a chair behind you for safety in case of loss of balance or fatigue.  http://plyo.exer.us/69  Copyright  VHI. All rights reserved.      SINGLE LIMB STANCE    Holding on to [employer] with both hands: Stance: single leg on floor. Raise leg. Hold _10__ seconds. Repeat with other leg. _3__ reps per set

## 2017-08-24 DIAGNOSIS — G8220 Paraplegia, unspecified: Secondary | ICD-10-CM | POA: Diagnosis not present

## 2017-08-24 DIAGNOSIS — G9511 Acute infarction of spinal cord (embolic) (nonembolic): Secondary | ICD-10-CM | POA: Diagnosis not present

## 2017-08-24 DIAGNOSIS — R29898 Other symptoms and signs involving the musculoskeletal system: Secondary | ICD-10-CM | POA: Diagnosis not present

## 2017-08-24 DIAGNOSIS — Z9889 Other specified postprocedural states: Secondary | ICD-10-CM | POA: Diagnosis not present

## 2017-09-02 ENCOUNTER — Telehealth: Payer: Self-pay

## 2017-09-02 NOTE — Telephone Encounter (Signed)
Pt calling asking for an appt having pain in the back of his legs

## 2017-09-02 NOTE — Telephone Encounter (Signed)
Left patient a message to call our office back to schedule an appointment with Dr. Riley Kill, patient is a non narcotic patient.

## 2017-09-05 ENCOUNTER — Ambulatory Visit: Payer: PRIVATE HEALTH INSURANCE | Admitting: Adult Health

## 2017-09-05 ENCOUNTER — Encounter: Payer: Self-pay | Admitting: Adult Health

## 2017-09-05 NOTE — Progress Notes (Deleted)
[facility] [address] [place]. [place] [address] [phone]       OFFICE FOLLOW-UP NOTE  [name] Date of Birth:  [birth year] Medical Record Number:  [id number]   HPI: Mr Dirosa is a 62 year old African-American gentleman is seen today for follow-up visit for for paraplegia in November 2018. He is accompanied by his nephew and wife and history is obtained from them as well as review of electronic medical records. I have personally reviewed imaging films. Victor [name] is an 62 y.o. male who presented from home to [facility] on 03/07/2017 with acute lower extremity paralysis. He is a poor and inconsistent historian, requiring questions to be repeated multiple times for clarification many times during the interview. Best information obtained is as follows: He was in [facility] until about 3 weeks ago when he started to feel weak in the legs. He works as a Paediatric nurse and this would be noticed at times during the day, especially when attempting to stand up from a seated position. He denies having had any arm weakness, vision changes, confusion, trouble talking or sensory numbness during the 3 week time period. He does report having occasional SOB. This weekend, symptoms worsened on Saturday with trouble standing, so he decided to spend most of the day in bed watching TV. He had similar symptoms on Sunday, so he spent the day watching football on TV while lying in bed. He gives inconsistent reports regarding Sunday, at one point stating he spent the whole day in bed, and at another point stating that he could get up and walk. He states he lay in bed until about 1-2 AM on Monday morning (today), when he then tried to get up but legs gave out from under him. He fell to the floor per one account and slid downwards by another account. He could not get up after he slid/fell to the floor. He called out to his parents, who he lives with, and they called EMS. He denied bowel or  bladder incontinence, but states that for the past 3-4 weeks he has had trouble fully emptying his bladder. 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 His clinical exam showed a T10 level sensory level but flaccid paraparesis and MRI scan showed spinal cord hyperintensity from T9 down which was felt compatible with spinal cord infarct. A lumbar epidural drain was placed and he was also started on steroids. 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 Patient showed slight improvement and was able to move lower extremities slightly. No hospitalization. He was seen by physical occupational therapy and transferred for rehabilitation. He has been getting home physical and occupational therapy and plans to start outpatient therapy soon. He has been followed at rehabilitation clinic by Dr. Caren Hazy. He has shown some improvement and now is able to walk with a walker at home. He complains of some aching in burning pain in his legs and some constipation. He is able to empty his bladder quite well he has only occasional incontinence.  09/05/17 UPDATE:      ROS:   14 system review of systems is positive for  dizziness, headache, leg weakness, gait difficulty following the systems negative PMH:  Past Medical  History:  Diagnosis Date  . History of kidney stones   . Hypertension   . 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 Stroke (HCC)   . Thoracic aortic aneurysm ([facility]CC) 09/2014    Social History:  Social History   Socioeconomic History  . Marital status: Single    Spouse name: Not on file  . Number of children: Not on file  . Years of education: Not on file  . Highest education  level: Not on file  Occupational History  . Not on file  Social Needs  . Financial resource strain: Not on file  . Food insecurity:    Worry: Not on file    Inability: Not on file  . Transportation needs:    Medical: Not on file    Non-medical: Not on file  Tobacco Use  . Smoking status: Former Smoker    Years: 35.00    Types: Cigarettes, Cigars    Last attempt to quit: 10/07/2014    Years since quitting: 2.9  . Smokeless tobacco: Never Used  Substance and Sexual Activity  . Alcohol use: No    Alcohol/week: 0.0 oz  . Drug use: No  . Sexual activity: Not on file  Lifestyle  . Physical activity:    Days per week: Not on file    Minutes per session: Not on file  . Stress: Not on file  Relationships  . Social connections:    Talks on phone: Not on file    Gets together: Not on file    Attends religious service: Not on file    Active member of club or organization: Not on file    Attends meetings of clubs or organizations: Not on file    Relationship status: Not on file  . Intimate partner violence:    Fear of current or ex partner: Not on file    Emotionally abused: Not on file    Physically abused: Not on file    Forced sexual activity: Not on file  Other Topics Concern  . Not on file  Social History Narrative  . Not on file    Medications:   Current Outpatient Medications on File Prior to Visit  Medication Sig Dispense Refill  . aspirin 81 MG EC tablet Take 1 tablet (81 mg total) by mouth daily. (Patient taking differently: Take 162 mg by mouth daily. )    . bisacodyl (DULCOLAX) 10 MG suppository Place 1 suppository (10 mg total) rectally daily at 6 (six) AM. 12 suppository 0  . gabapentin (NEURONTIN) 100 MG capsule Take 1 capsule (100 mg total) by mouth 3 (three) times daily. 90 capsule 3  . HYDROcodone-acetaminophen (NORCO/VICODIN) 5-325 MG tablet Take 1 tablet by mouth every 8 (eight) hours as needed for moderate pain. 45 tablet 0  . metoprolol tartrate  (LOPRESSOR) 25 MG tablet Take 0.5 tablets (12.5 mg total) by mouth 2 (two) times daily. Hold dose if BP<100/60 or pulse<50 your are symptomatic 60 tablet 4  . senna-docusate (SENOKOT-S) 8.6-50 MG tablet Take 2 tablets by mouth at bedtime.    . traZODone (DESYREL) 100 MG tablet Take 1 tablet (100 mg total) by mouth at bedtime. 30 tablet 2   No current facility-administered medications on file prior to visit.     Allergies:  No Known Allergies  Physical Exam General: Frail middle-aged African American male, seated, in no evident distress Head: head normocephalic and atraumatic.  Neck: supple with no carotid or supraclavicular bruits Cardiovascular: regular rate and rhythm, no  murmurs Musculoskeletal: no deformity Skin:  no rash/petichiae Vascular:  Normal pulses all extremities There were no vitals filed for this visit. Neurologic Exam Mental Status: Awake and fully alert. Oriented to place and time. Recent and remote memory intact. Attention span, concentration and fund of knowledge appropriate. Mood and affect appropriate.  Cranial Nerves: Fundoscopic exam reveals sharp disc margins. Pupils equal, briskly reactive to light. Extraocular movements full without nystagmus. Visual fields full to confrontation. Hearing intact. Facial sensation intact. Face, tongue, palate moves normally and symmetrically.  Motor: Diminished bulk in lower extremities with slight increased tone. Normal strength in upper extremities. Paraparesis with bilateral hip 3/5 in the flexors 4/5 in the hip extensors 3/5 in the adductor and abductor is with slightly greater stent on the right compared to the left. Knee extensors 3/5 bilaterally knee flexors 2/5. Bilateral foot drop with ankle dorsiflexors 1/5 with 3/5 plantar flexors bilaterally.  Plantars not checked. Touch pinprick. Sensory.: intact to touch ,pinprick .position and vibratory sensation. No sensory level on the trunk Coordination: Rapid alternating movements  normal in all extremities. Finger-to-nose and heel-to-shin performed accurately bilaterally. Gait and Station: Not checked as patient did not bring his walker  Reflexes: Deep tendon reflexes are brisk throughout. In sustained clonus at the right ankle. Toes downgoing.       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    PLAN: -Continue aspirin 81 mg daily for secondary stroke prevention -f/u PCP regarding HTN management   Continue to monitor blood pressure at home  Maintain strict control of hypertension with blood pressure goal below 130/90, diabetes with hemoglobin A1c goal below 6.5% and cholesterol with LDL cholesterol (bad cholesterol) goal below 70 mg/dL. I also advised the patient to eat a healthy diet with plenty of whole grains, cereals, fruits and vegetables, exercise regularly and maintain ideal body weight.  Followup in the future with me in *** or call earlier if needed   Greater than 50% of time during this 25 minute visit was spent on counseling,explanation of diagnosis of spinal cord infarct and paraplegia, planning of further management, discussion with patient and family and coordination of care  George Hugh, [facility]  [facility] [address] [place], [place] [address]  Phone [phone] Fax [phone]

## 2017-09-06 ENCOUNTER — Telehealth: Payer: Self-pay

## 2017-09-06 NOTE — Telephone Encounter (Signed)
Patient called stated is having an increase in his leg pain and is asking is there anything that we can do to help him with his pain.

## 2017-09-06 NOTE — Telephone Encounter (Signed)
Patient no show for appt on 09/05/2017. 

## 2017-09-07 ENCOUNTER — Ambulatory Visit: Payer: Medicaid Other | Admitting: Adult Health

## 2017-09-07 ENCOUNTER — Encounter: Payer: Self-pay | Admitting: Adult Health

## 2017-09-07 VITALS — BP 113/73 | HR 100 | Ht 67.0 in | Wt 150.4 lb

## 2017-09-07 DIAGNOSIS — M792 Neuralgia and neuritis, unspecified: Secondary | ICD-10-CM | POA: Diagnosis not present

## 2017-09-07 DIAGNOSIS — R29898 Other symptoms and signs involving the musculoskeletal system: Secondary | ICD-10-CM

## 2017-09-07 DIAGNOSIS — G9511 Acute infarction of spinal cord (embolic) (nonembolic): Secondary | ICD-10-CM

## 2017-09-07 MED ORDER — GABAPENTIN 100 MG PO CAPS: 100.0000 mg | ORAL_CAPSULE | Freq: Every day | ORAL | 3 refills | Status: DC

## 2017-09-07 MED ORDER — GABAPENTIN 300 MG PO CAPS: 300.0000 mg | ORAL_CAPSULE | Freq: Every day | ORAL | 3 refills | Status: DC

## 2017-09-07 NOTE — Progress Notes (Signed)
[facility] [address] [place]. [place] [address] [phone]       OFFICE FOLLOW-UP NOTE  Victor. Victor [name] Date of Birth:  [birth year] Medical Record Number:  [id number]   HPI: Victor [name] is seen today for first office follow-up visit for hospital admission for paraplegia in November 2018. He is accompanied by his nephew and wife and history is obtained from them as well as review of electronic medical records. I have personally reviewed imaging films. Victor [name] is an 62 y.o. male who presented from home to [facility] on 03/07/2017 with acute lower extremity paralysis. He is a poor and inconsistent historian, requiring questions to be repeated multiple times for clarification many times during the interview. Best information obtained is as follows: He was in [facility] until about 3 weeks ago when he started to feel weak in the legs. He works as a Paediatric nurse and this would be noticed at times during the day, especially when attempting to stand up from a seated position. He denies having had any arm weakness, vision changes, confusion, trouble talking or sensory numbness during the 3 week time period. He does report having occasional SOB. This weekend, symptoms worsened on Saturday with trouble standing, so he decided to spend most of the day in bed watching TV. He had similar symptoms on Sunday, so he spent the day watching football on TV while lying in bed. He gives inconsistent reports regarding Sunday, at one point stating he spent the whole day in bed, and at another point stating that he could get up and walk. He states he lay in bed until about 1-2 AM on Monday morning (today), when he then tried to get up but legs gave out from under him. He fell to the floor per one account and slid downwards by another account. He could not get up after he slid/fell to the floor. He called out to his parents, who he lives with, and they called EMS. He denied bowel or bladder  incontinence, but states that for the past 3-4 weeks he has had trouble fully emptying his bladder. 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 His clinical exam showed a T10 level sensory level but flaccid paraparesis and MRI scan showed spinal cord hyperintensity from T9 down which was felt compatible with spinal cord infarct. A lumbar epidural drain was placed and he was also started on steroids. 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 Patient showed slight improvement and was able to move lower extremities slightly. No hospitalization. He was seen by physical occupational therapy and transferred for rehabilitation. He has been getting home physical and occupational therapy and plans to start outpatient therapy soon. He has been followed at rehabilitation clinic by Dr. Caren Hazy. He has shown some improvement and now is able to walk with a walker at home. He complains of some aching in burning pain in his legs and some constipation. He is able to empty his bladder quite well he has only occasional incontinence.  09/07/17 UPDATE: Patient returns today for 47-month follow-up visit.  Patient has recently completed therapies next-door in her neuro rehab center and has an appointment on Monday at the [facility] for additional rehab.  Patient continues to take aspirin without side effects of bleeding or bruising.   blood pressure satisfactory at 113/73.  He is currently using a rolling walker for ambulation but does still continue to have bilateral lower extremity weakness.  Also has complaints of nerve pain.  He has a order for Neurontin 100 mg 3 times daily but states he is only been  taking this twice a day.  Reports that this does help him during the day but at night he has burning pain in bilateral lower extremities where it will wake him up in the middle the night.  Patient does endorse dizziness at time after sitting for prolonged period of time and then going to stand up.  This is been occurring since his stroke but has been slowly getting better.  Denies new or worsening stroke/TIA symptoms.   ROS:   14 system review of systems is positive for back pain, aching muscles, walking difficulty, and dizziness and the following systems are negative  PMH:  Past Medical History:  Diagnosis Date  . History of kidney stones   . Hypertension   . 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 Stroke (HCC)   . Thoracic aortic aneurysm (HCC) 09/2014    Social History:  Social History   Socioeconomic History  . Marital status: Single    Spouse name: Not on file  . Number of children: Not on file  . Years of education: Not on file  . Highest education level: Not on file  Occupational History  . Not on file  Social Needs  . Financial resource strain: Not on file  . Food insecurity:    Worry: Not on file    Inability: Not on file  . Transportation needs:    Medical: Not on file    Non-medical: Not on file  Tobacco Use  . Smoking status: Former Smoker    Years: 35.00    Types: Cigarettes, Cigars    Last attempt to quit: 10/07/2014    Years since quitting: 2.9  . Smokeless tobacco: Never Used  Substance and Sexual Activity  . Alcohol use: No    Alcohol/week: 0.0 oz  . Drug use: No  . Sexual activity: Not on file  Lifestyle  . Physical activity:    Days per week: Not on file    Minutes per session: Not on file  . Stress: Not on file  Relationships  . Social connections:    Talks on phone: Not on file    Gets together: Not on file    Attends religious service: Not on file    Active member of club or organization: Not on file    Attends meetings of clubs or  organizations: Not on file    Relationship status: Not on file  . Intimate partner violence:    Fear of current or ex partner: Not on file    Emotionally abused: Not on file    Physically abused: Not on file    Forced sexual activity: Not on file  Other Topics Concern  . Not on file  Social History Narrative  . Not on file    Medications:   Current Outpatient Medications on File Prior to Visit  Medication Sig Dispense Refill  . aspirin 81 MG EC tablet Take 1 tablet (81 mg total) by mouth daily. (Patient taking differently: Take 162 mg by mouth daily. )    . metoprolol tartrate (LOPRESSOR) 25 MG tablet Take 0.5 tablets (12.5 mg total) by mouth  2 (two) times daily. Hold dose if BP<100/60 or pulse<50 your are symptomatic 60 tablet 4   No current facility-administered medications on file prior to visit.     Allergies:  No Known Allergies  Physical Exam General: Pleasant middle-aged African American male, seated, in no evident distress Head: head normocephalic and atraumatic.  Neck: supple with no carotid or supraclavicular bruits Cardiovascular: regular rate and rhythm, no murmurs Musculoskeletal: no deformity Skin:  no rash/petichiae Vascular:  Normal pulses all extremities Vitals:   09/07/17 1539  BP: 113/73  Pulse: 100   Neurologic Exam Mental Status: Awake and fully alert. Oriented to place and time. Recent and remote memory intact. Attention span, concentration and fund of knowledge appropriate. Mood and affect appropriate.  Cranial Nerves: Fundoscopic exam reveals sharp disc margins. Pupils equal, briskly reactive to light. Extraocular movements full without nystagmus. Visual fields full to confrontation. Hearing intact. Facial sensation intact. Face, tongue, palate moves normally and symmetrically.  Motor: Diminished bulk in lower extremities with slight increased tone. Normal strength in upper extremities. Paraparesis with bilateral hip 3/5 in the flexors 4/5 in the hip  extensors 3/5 in the adductor and abductor is with slightly greater on the right compared to the left. Knee extensors 3/5 bilaterally; knee flexors 3/5. Bilateral foot drop with ankle dorsiflexors 2/5 with 3/5 plantar flexors bilaterally.  Plantars not checked. Touch pinprick. Sensory.: intact to touch ,pinprick .position and vibratory sensation.  Coordination: Rapid alternating movements normal in all extremities. Finger-to-nose and heel-to-shin performed accurately bilaterally. Gait and Station: Patient ambulating using rolling walker; unable to test tandem gait; patient does walk with stiffened left leg Reflexes: Deep tendon reflexes are brisk throughout.  Toes downgoing.       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  Patient is overall doing well and continues to make improvements in generalized strength.   PLAN: -Continue aspirin 81 mg daily for secondary stroke prevention -F/u with PCP regarding your blood pressure management -continue to monitor BP at home -continue therapies -Patient inquired prescription for pain medicine as he was previously prescribed hydrocodone-acetaminophen by PCP for back pain -advised patient that he will need to speak to PCP regarding this and possible referral for pain management -patient in agreement to this -increased gabapentin from  TID to  in the morning and  in the evening (patient was not taking afternoon dose and pain increased at night) -Maintain strict control of hypertension with blood pressure goal below 130/90, diabetes with hemoglobin A1c goal below 6.5% and cholesterol with LDL cholesterol (bad cholesterol) goal below 70 mg/dL. I also advised the patient to eat a healthy diet with plenty of whole grains, cereals, fruits and vegetables, exercise regularly and maintain ideal body weight.  Follow up in 6  months or call earlier if needed  Greater than 50% of time during this 25 minute visit was spent on counseling,explanation of diagnosis of spinal cord infarct and paraplegia, planning of further management, discussion with patient and coordination of care  George Hugh, [facility]  [facility] [address] [place], [place] [address]  Phone [phone] Fax [phone]

## 2017-09-07 NOTE — Telephone Encounter (Signed)
Pt last seen in march. This is a copy of phone call note from 5/10: Left patient a message to call our office back to schedule an appointment with Dr. Riley Kill, patient is a non narcotic patient.  Can offer non-narcotic treatment. Needs to be seen. Can schedule with Riley Lam.,

## 2017-09-07 NOTE — Patient Instructions (Signed)
Continue aspirin 81 mg daily for secondary stroke prevention  Take  (1 capsule) in the morning and take  in the evening (you can take 3 capsules of  at this time until you run out; once you pick up your new prescription you will only take 1  tablet)  Continue therapy in order to get stronger  Continue to follow up with PCP regarding blood pressure management   Continue to monitor blood pressure at home  Maintain strict control of hypertension with blood pressure goal below 130/90, diabetes with hemoglobin A1c goal below 6.5% and cholesterol with LDL cholesterol (bad cholesterol) goal below 70 mg/dL. I also advised the patient to eat a healthy diet with plenty of whole grains, cereals, fruits and vegetables, exercise regularly and maintain ideal body weight.  Followup in the future with me in 6 months or call earlier if needed       Thank you for coming to see [place] at [facility]. I hope we have been able to provide you high quality care today.  You may receive a patient satisfaction survey over the next few weeks. We would appreciate your feedback and comments so that we may continue to improve ourselves and the health of our patients.

## 2017-09-07 NOTE — Telephone Encounter (Signed)
Left a detailed voice message per patient's DPR agreement, stating that Dr. Riley Kill wants the patient to make an appointment to be seen given non-narcotic status.

## 2017-09-08 NOTE — Progress Notes (Signed)
I agree with the above plan 

## 2017-09-24 DIAGNOSIS — G8220 Paraplegia, unspecified: Secondary | ICD-10-CM | POA: Diagnosis not present

## 2017-09-24 DIAGNOSIS — G9511 Acute infarction of spinal cord (embolic) (nonembolic): Secondary | ICD-10-CM | POA: Diagnosis not present

## 2017-09-24 DIAGNOSIS — Z9889 Other specified postprocedural states: Secondary | ICD-10-CM | POA: Diagnosis not present

## 2017-09-24 DIAGNOSIS — R29898 Other symptoms and signs involving the musculoskeletal system: Secondary | ICD-10-CM | POA: Diagnosis not present

## 2017-10-07 ENCOUNTER — Other Ambulatory Visit: Payer: Self-pay | Admitting: Physician Assistant

## 2017-10-07 DIAGNOSIS — I10 Essential (primary) hypertension: Secondary | ICD-10-CM

## 2017-10-24 DIAGNOSIS — G9511 Acute infarction of spinal cord (embolic) (nonembolic): Secondary | ICD-10-CM | POA: Diagnosis not present

## 2017-10-24 DIAGNOSIS — Z9889 Other specified postprocedural states: Secondary | ICD-10-CM | POA: Diagnosis not present

## 2017-10-24 DIAGNOSIS — R29898 Other symptoms and signs involving the musculoskeletal system: Secondary | ICD-10-CM | POA: Diagnosis not present

## 2017-10-24 DIAGNOSIS — G8220 Paraplegia, unspecified: Secondary | ICD-10-CM | POA: Diagnosis not present

## 2017-10-31 ENCOUNTER — Encounter: Payer: Self-pay | Admitting: Rehabilitation

## 2017-10-31 NOTE — Therapy (Signed)
[facility] [address] [place], [place], [address] Phone: [phone]   Fax:  [phone]  Patient Details  Name: Victor [name] MRN: [id number] Date of Birth: April 04, 1956 Referring Provider:  No ref. provider found  Encounter Date: 10/31/2017    PHYSICAL THERAPY DISCHARGE SUMMARY  Visits from Start of Care: 17  Current functional level related to goals / functional outcomes: PT Long Term Goals - 08/22/17 1110      PT LONG TERM GOAL #1   Title  Pt will be independent with HEP in order to indicate improved functional mobility and decreased fall risk.  (Target Date: 08/01/17)    Baseline  met 08/22/17    Time  8    Period  Weeks    Status  Achieved      PT LONG TERM GOAL #2   Title  Pt will improve gait speed by 1.20 ft/sec from baseline in order to indicate decreased fall risk.     Baseline  .75 ft/sec at baseline with RW, no AFO, 1.47 ft/sec     Time  8    Period  Weeks    Status  Achieved      PT LONG TERM GOAL #3   Title  Pt will perform TUG </=30 secs w/ LRAD in order to indicate decreased fall risk.      Baseline  51.16 secs with RW at baseline to 23.28 secs with RW (L AFO, R foot up brace) on 07/28/17    Time  8    Period  Weeks    Status  Achieved      PT LONG TERM GOAL #4   Title  Pt will ambulate 300' w/ LRAD over indoor surfaces at mod I level in order to indicate safe negotation in home.     Baseline  40' w/ RW at min A level, mod I 300' with RW and LAFO, R foot up brace 08/22/17    Time  8    Period  Weeks    Status  Achieved      PT LONG TERM GOAL #5   Title  Pt will ambulate 200' w/ LRAD over paved outdoor surfaces at S level in order to indicate pt slow return to community gait.      Baseline  200' over uneven pavement with RW and AFO; supervision    Time  8    Period  Weeks    Status  Achieved         Remaining deficits: Pt continued to have BLE weakness from spinal cord CVA, however was  limited due to medicaid coverage and unable to get further visits.  Has HEP to address weakness.     Education / Equipment: HEP, AFO  Plan: Patient agrees to discharge.  Patient goals were met. Patient is being discharged due to meeting the stated rehab goals.  ?????        Cameron Sprang, PT, MPT [facility] [address] [place], [place], [address] Phone: [phone]   Fax:  [phone] 10/31/17, 1:39 PM

## 2017-11-09 NOTE — Telephone Encounter (Signed)
After 2 months pt never made appt.

## 2017-11-24 DIAGNOSIS — G9511 Acute infarction of spinal cord (embolic) (nonembolic): Secondary | ICD-10-CM | POA: Diagnosis not present

## 2017-11-24 DIAGNOSIS — G8220 Paraplegia, unspecified: Secondary | ICD-10-CM | POA: Diagnosis not present

## 2017-11-24 DIAGNOSIS — R29898 Other symptoms and signs involving the musculoskeletal system: Secondary | ICD-10-CM | POA: Diagnosis not present

## 2017-11-24 DIAGNOSIS — Z9889 Other specified postprocedural states: Secondary | ICD-10-CM | POA: Diagnosis not present

## 2017-11-24 DIAGNOSIS — K4090 Unilateral inguinal hernia, without obstruction or gangrene, not specified as recurrent: Secondary | ICD-10-CM | POA: Diagnosis not present

## 2017-12-04 ENCOUNTER — Other Ambulatory Visit: Payer: Self-pay | Admitting: Physician Assistant

## 2017-12-04 DIAGNOSIS — K219 Gastro-esophageal reflux disease without esophagitis: Secondary | ICD-10-CM

## 2017-12-25 DIAGNOSIS — G9511 Acute infarction of spinal cord (embolic) (nonembolic): Secondary | ICD-10-CM | POA: Diagnosis not present

## 2017-12-25 DIAGNOSIS — R29898 Other symptoms and signs involving the musculoskeletal system: Secondary | ICD-10-CM | POA: Diagnosis not present

## 2017-12-25 DIAGNOSIS — G8220 Paraplegia, unspecified: Secondary | ICD-10-CM | POA: Diagnosis not present

## 2017-12-25 DIAGNOSIS — Z9889 Other specified postprocedural states: Secondary | ICD-10-CM | POA: Diagnosis not present

## 2018-01-03 IMAGING — DX DG CHEST 1V PORT
1 series · 1 of 1 positions shown · non-contrast
Comparison: Chest radiograph 01/19/2017

CLINICAL DATA: Thoracic aortic aneurysm repair

EXAM:
PORTABLE CHEST 1 VIEW

[chest]
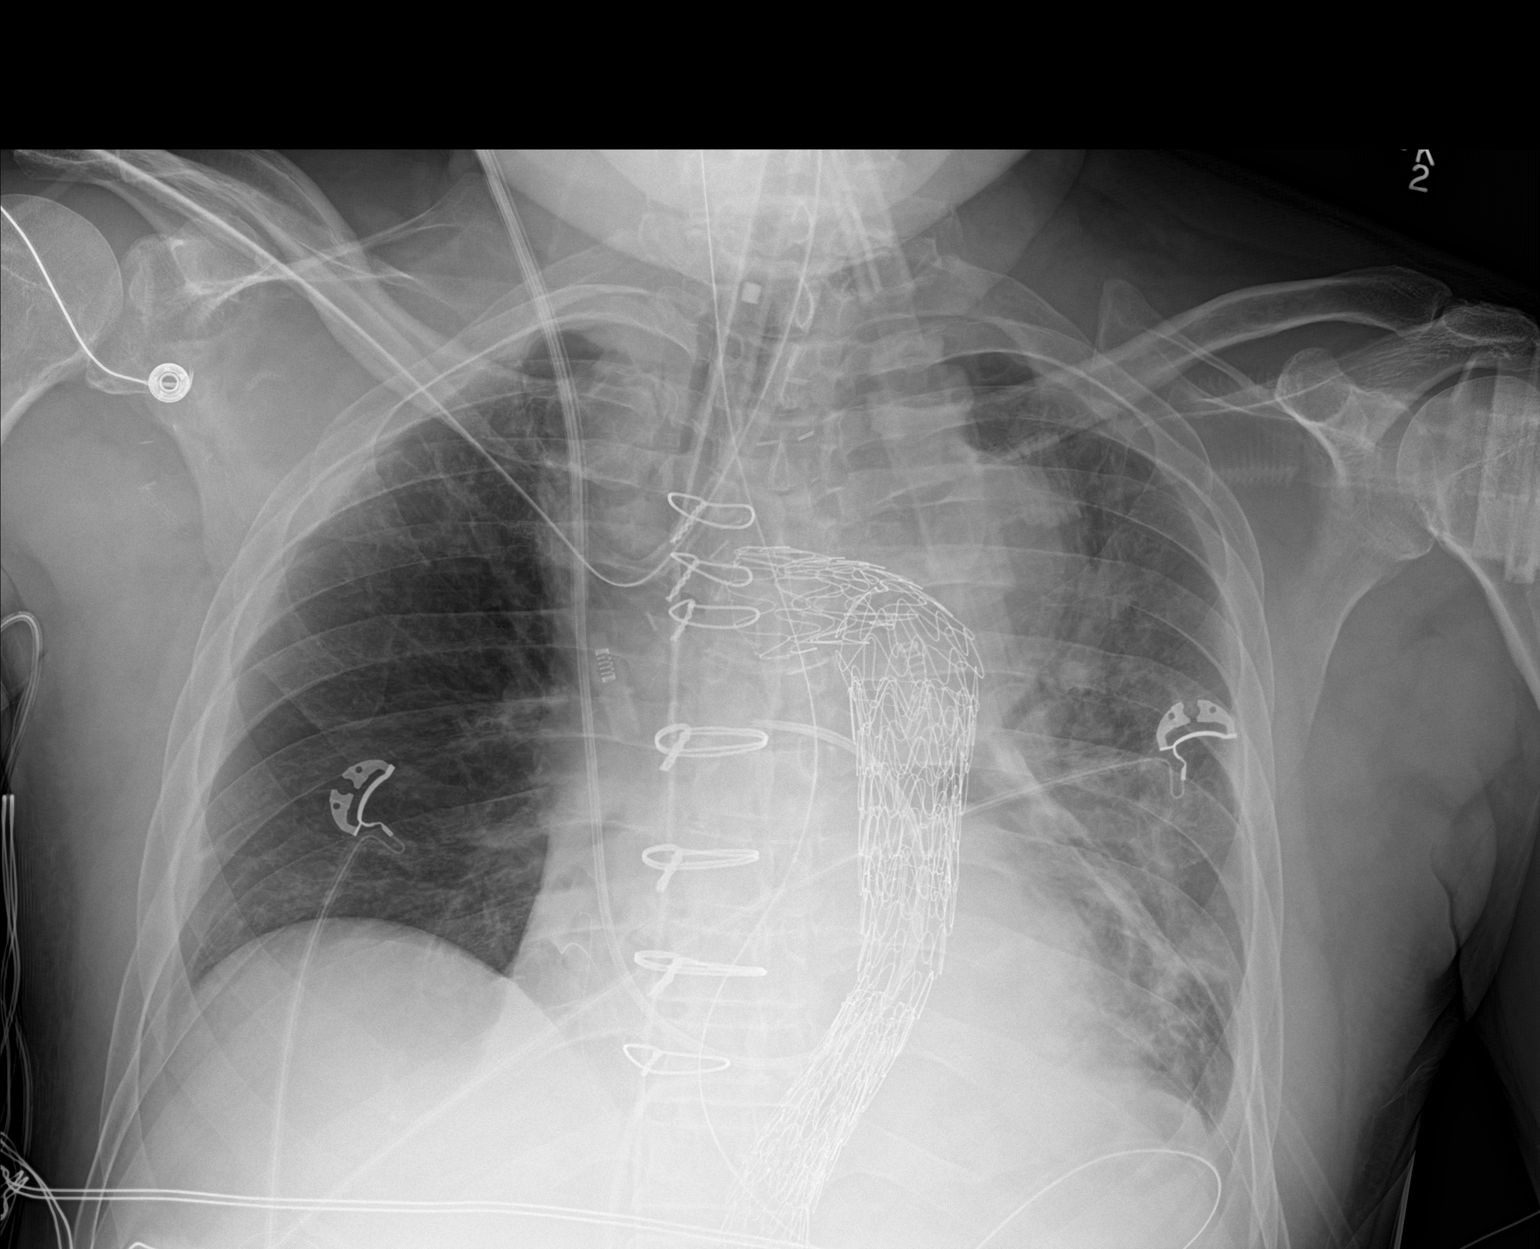

[1 of 1 positions shown; findings below may reference images not displayed]

FINDINGS: There is a thoracic aortic endograft stent is now present. There is
left hemithoracic volume loss and mild to moderate pulmonary edema.
The right lung is clear. There is a right internal jugular venous
approach pulmonary arterial catheter with tip probably within the
proximal right pulmonary artery. Endotracheal tube tip is at the
level of the clavicular heads. Enteric tube courses beyond the field
of view.
IMPRESSION: 1. Status post placement of thoracic aortic endograft with left lung
volume loss and mild edema.
2. Pulmonary arterial catheter tip near the origin of the right
pulmonary artery.

## 2018-02-04 IMAGING — DX DG CHEST 2V
2 series · 2 of 2 positions shown · non-contrast
Comparison: 01/27/2017.

CLINICAL DATA: Status post aneurysm repair 10/06/2014, now with
shortness of breath. Hypertension.

EXAM:
CHEST  2 VIEW

[dg chest 2 view (1 of 2)]
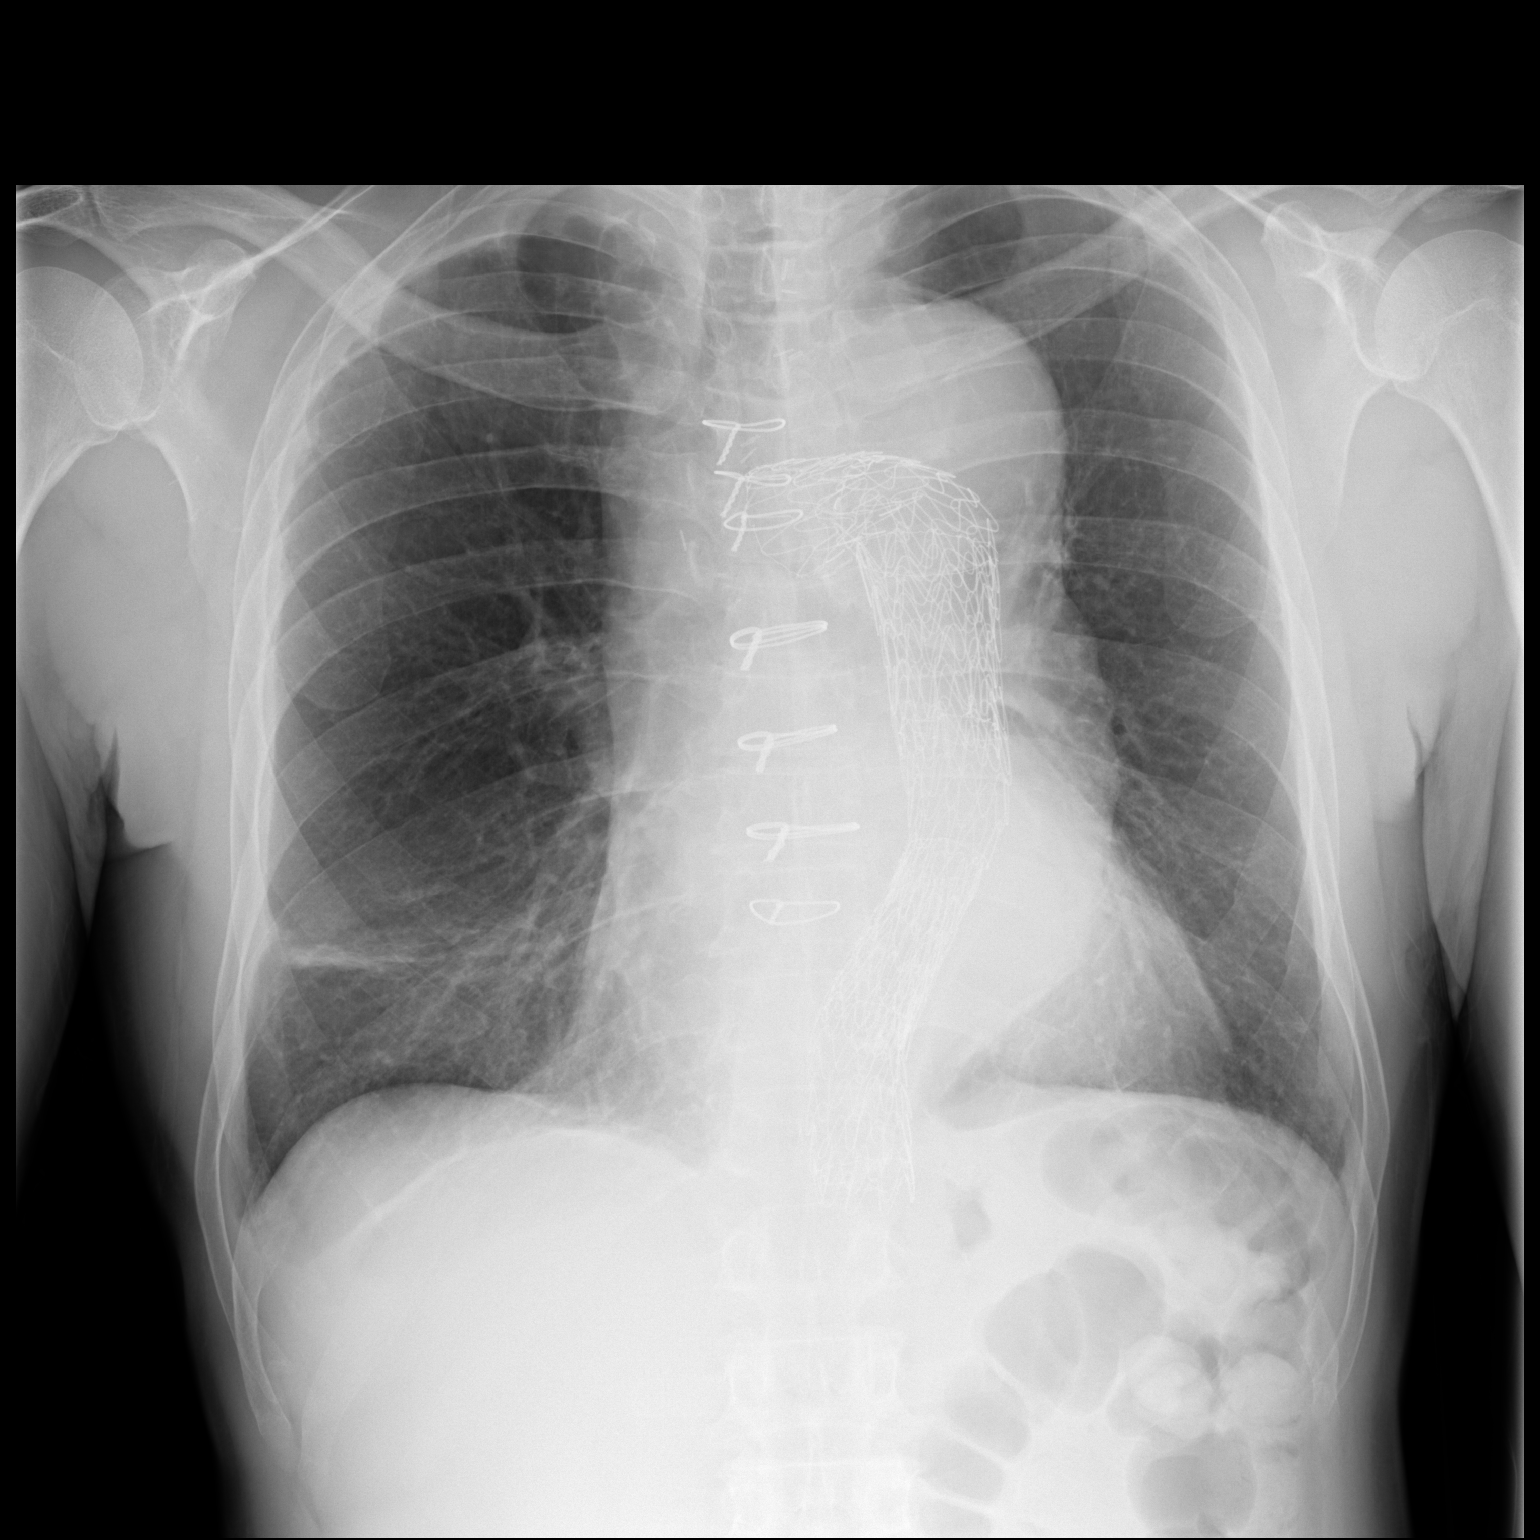

[dg chest 2 view (2 of 2)]
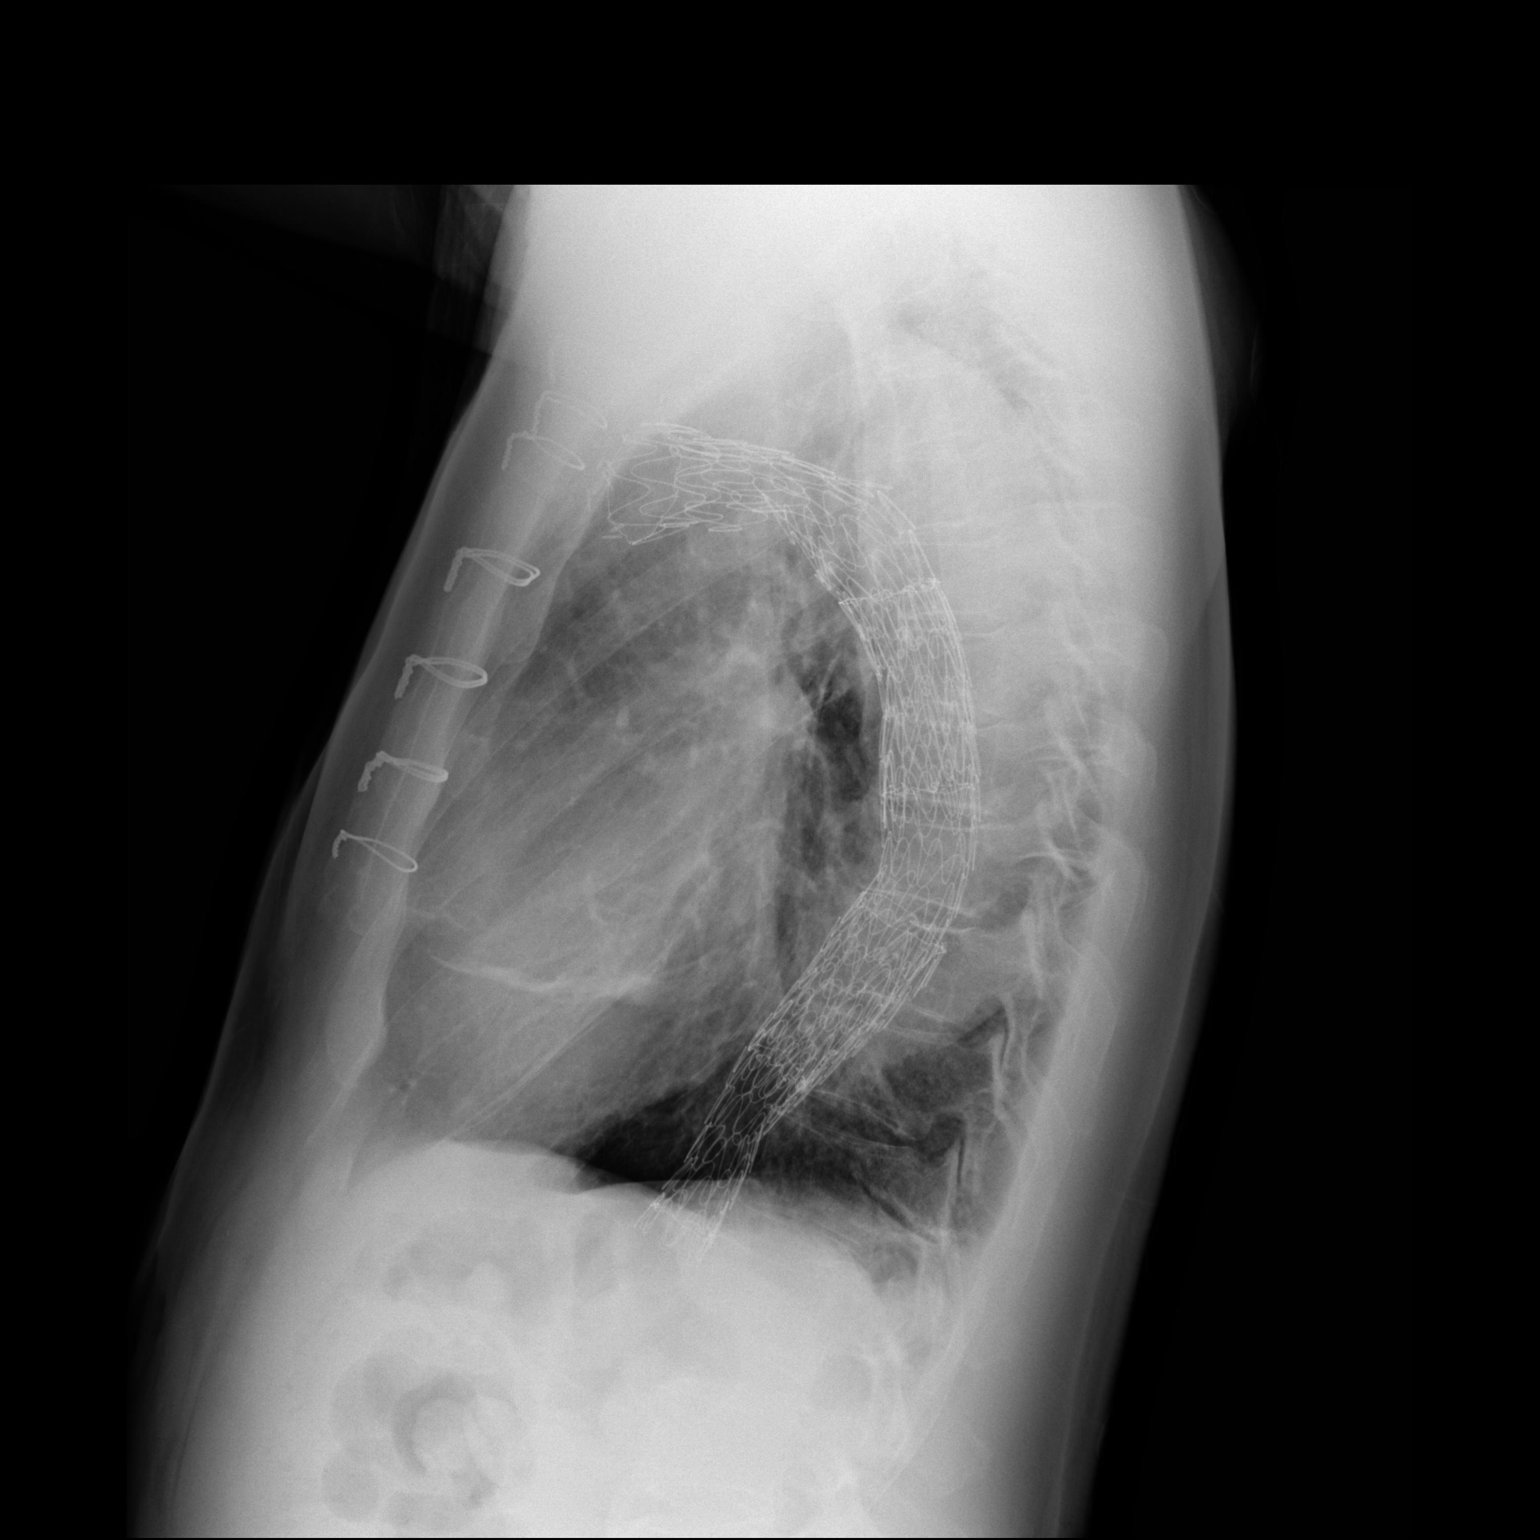

[2 of 2 positions shown; findings below may reference images not displayed]

FINDINGS: Stable cardiomediastinal silhouette. Stable appearance of thoracic
aortic graft. Native aorta extends considerably beyond the graft.
There is no consolidation or edema. Chronic scarring RIGHT mid to
lower lung zone. No effusion or pneumothorax. No osseous finding of
acuity.
IMPRESSION: Stable appearance. No definite active findings or interval change
from recent priors.

## 2018-02-09 ENCOUNTER — Emergency Department (HOSPITAL_COMMUNITY)
Admission: EM | Admit: 2018-02-09 | Discharge: 2018-02-09 | Disposition: A | Discharge status: Home / Self Care | Payer: Medicaid Other | Attending: Emergency Medicine | Admitting: Emergency Medicine

## 2018-02-09 ENCOUNTER — Emergency Department (HOSPITAL_COMMUNITY): Payer: Medicaid Other

## 2018-02-09 DIAGNOSIS — R112 Nausea with vomiting, unspecified: Secondary | ICD-10-CM | POA: Diagnosis not present

## 2018-02-09 DIAGNOSIS — Z87891 Personal history of nicotine dependence: Secondary | ICD-10-CM | POA: Insufficient documentation

## 2018-02-09 DIAGNOSIS — Z7982 Long term (current) use of aspirin: Secondary | ICD-10-CM | POA: Diagnosis not present

## 2018-02-09 DIAGNOSIS — Z8673 Personal history of transient ischemic attack (TIA), and cerebral infarction without residual deficits: Secondary | ICD-10-CM | POA: Diagnosis not present

## 2018-02-09 DIAGNOSIS — M792 Neuralgia and neuritis, unspecified: Secondary | ICD-10-CM

## 2018-02-09 DIAGNOSIS — Z79899 Other long term (current) drug therapy: Secondary | ICD-10-CM | POA: Diagnosis not present

## 2018-02-09 DIAGNOSIS — R61 Generalized hyperhidrosis: Secondary | ICD-10-CM | POA: Diagnosis not present

## 2018-02-09 DIAGNOSIS — R0902 Hypoxemia: Secondary | ICD-10-CM | POA: Diagnosis not present

## 2018-02-09 DIAGNOSIS — R55 Syncope and collapse: Secondary | ICD-10-CM | POA: Diagnosis not present

## 2018-02-09 DIAGNOSIS — R42 Dizziness and giddiness: Secondary | ICD-10-CM | POA: Diagnosis not present

## 2018-02-09 DIAGNOSIS — N3000 Acute cystitis without hematuria: Secondary | ICD-10-CM | POA: Insufficient documentation

## 2018-02-09 DIAGNOSIS — I10 Essential (primary) hypertension: Secondary | ICD-10-CM | POA: Diagnosis not present

## 2018-02-09 DIAGNOSIS — G9511 Acute infarction of spinal cord (embolic) (nonembolic): Secondary | ICD-10-CM

## 2018-02-09 LAB — DIFFERENTIAL
Abs Immature Granulocytes: 0.02 10*3/uL (ref 0.00–0.07)
BASOS ABS: 0 10*3/uL (ref 0.0–0.1)
Basophils Relative: 0 %
EOS ABS: 0.1 10*3/uL (ref 0.0–0.5)
EOS PCT: 1 %
IMMATURE GRANULOCYTES: 0 %
LYMPHS ABS: 1.1 10*3/uL (ref 0.7–4.0)
Lymphocytes Relative: 14 %
MONO ABS: 0.4 10*3/uL (ref 0.1–1.0)
Monocytes Relative: 6 %
Neutro Abs: 6 10*3/uL (ref 1.7–7.7)
Neutrophils Relative %: 79 %

## 2018-02-09 LAB — CBC
HCT: 47.3 % (ref 39.0–52.0)
Hemoglobin: 14.1 g/dL (ref 13.0–17.0)
MCH: 25.6 pg — ABNORMAL LOW (ref 26.0–34.0)
MCHC: 29.8 g/dL — ABNORMAL LOW (ref 30.0–36.0)
MCV: 85.8 fL (ref 80.0–100.0)
PLATELETS: 261 10*3/uL (ref 150–400)
RBC: 5.51 MIL/uL (ref 4.22–5.81)
RDW: 16.1 % — AB (ref 11.5–15.5)
WBC: 7.7 10*3/uL (ref 4.0–10.5)
nRBC: 0 % (ref 0.0–0.2)

## 2018-02-09 LAB — URINALYSIS, ROUTINE W REFLEX MICROSCOPIC
Bilirubin Urine: NEGATIVE
GLUCOSE, UA: NEGATIVE mg/dL
Ketones, ur: NEGATIVE mg/dL
NITRITE: POSITIVE — AB
Protein, ur: NEGATIVE mg/dL
Specific Gravity, Urine: 1.016 (ref 1.005–1.030)
WBC, UA: >50 WBC/hpf — ABNORMAL HIGH (ref 0–5)
pH: 5 (ref 5.0–8.0)

## 2018-02-09 LAB — COMPREHENSIVE METABOLIC PANEL
ALBUMIN: 3.8 g/dL (ref 3.5–5.0)
ALT: 15 U/L (ref 0–44)
ANION GAP: 6 (ref 5–15)
AST: 19 U/L (ref 15–41)
Alkaline Phosphatase: 80 U/L (ref 38–126)
BILIRUBIN TOTAL: 1.1 mg/dL (ref 0.3–1.2)
BUN: 11 mg/dL (ref 8–23)
CO2: 26 mmol/L (ref 22–32)
Calcium: 9.1 mg/dL (ref 8.9–10.3)
Chloride: 108 mmol/L (ref 98–111)
Creatinine, Ser: 0.98 mg/dL (ref 0.61–1.24)
Glucose, Bld: 102 mg/dL — ABNORMAL HIGH (ref 70–99)
POTASSIUM: 5 mmol/L (ref 3.5–5.1)
Sodium: 140 mmol/L (ref 135–145)
TOTAL PROTEIN: 7 g/dL (ref 6.5–8.1)

## 2018-02-09 LAB — RAPID URINE DRUG SCREEN, HOSP PERFORMED
AMPHETAMINES: NOT DETECTED
Barbiturates: NOT DETECTED
Benzodiazepines: NOT DETECTED
Cocaine: NOT DETECTED
Opiates: POSITIVE — AB
Tetrahydrocannabinol: NOT DETECTED

## 2018-02-09 LAB — I-STAT TROPONIN, ED: Troponin i, poc: 0 ng/mL (ref 0.00–0.08)

## 2018-02-09 LAB — APTT: APTT: 28 s (ref 24–36)

## 2018-02-09 LAB — PROTIME-INR
INR: 1.16
PROTHROMBIN TIME: 14.7 s (ref 11.4–15.2)

## 2018-02-09 MED ORDER — GABAPENTIN 100 MG PO CAPS: 100.0000 mg | ORAL_CAPSULE | Freq: Every day | ORAL | 3 refills | Status: DC

## 2018-02-09 MED ORDER — CEPHALEXIN 500 MG PO CAPS: 500.0000 mg | ORAL_CAPSULE | Freq: Three times a day (TID) | ORAL | 0 refills | Status: DC

## 2018-02-09 MED ORDER — CEPHALEXIN 250 MG PO CAPS
500.0000 mg | ORAL_CAPSULE | Freq: Once | ORAL | Status: AC
  Administered 2018-02-09: 500 mg via ORAL
  Filled 2018-02-09: qty 2

## 2018-02-09 MED ORDER — GABAPENTIN 300 MG PO CAPS: 300.0000 mg | ORAL_CAPSULE | Freq: Every day | ORAL | 3 refills | Status: DC

## 2018-02-09 NOTE — ED Notes (Signed)
ED Provider at bedside. 

## 2018-02-09 NOTE — ED Notes (Signed)
Patient verbalizes understanding of discharge instructions. Opportunity for questioning and answers were provided. Armband removed by staff, pt discharged from ED.  

## 2018-02-09 NOTE — ED Notes (Signed)
Place condom cath. He dribbles alot

## 2018-02-09 NOTE — ED Triage Notes (Signed)
Per [facility], pt arrives from work after experiencing sudden onset dizziness, dry heaving and diaphoresis at 1105. Hx of stroke with left sided deficits. Pt denies any new weakness today. Pt denies pain. Pt is alert, oriented, and following commands. Pt did not have breakfast today. Per EMS, pt has a clot on "left side of neck" resulting in weaker pulses in left side.

## 2018-02-09 NOTE — ED Provider Notes (Signed)
[facility] EMERGENCY DEPARTMENT Provider Note   CSN: [id number] Arrival date & time: 02/09/18  1209     History   Chief Complaint Chief Complaint  Patient presents with  . Dizziness  . Emesis    HPI Victor [name] is a 62 y.o. male.  HPI Pt was at work today.  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  He became very sweaty, nauseated and had dry heaves.  NO trouble with speech or vision.  No numbness or weakness in his extremities.  He did feel like he was moving, spinning.  Pt called ems.  Since then the sx have been getting better.  He does have a history of prior stroke.  Past Medical History:  Diagnosis Date  . History of kidney stones   . Hypertension   . 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 Stroke (HCC)   . Thoracic aortic aneurysm (HCC) 09/2014    Patient Active Problem List   Diagnosis Date Noted  . Unilateral inguinal hernia without obstruction or gangrene 06/29/2017  . Nerve pain 05/18/2017  . Spinal cord infarction (HCC) 03/15/2017  . Paraplegia (HCC) 03/07/2017  . Bilateral leg weakness 03/07/2017  . S/P thoracic aortic aneurysm repair 01/21/2017  . Coronary artery calcification 12/23/2016  . S/P aortic dissection repair 12/23/2016  . Thoracic aortic aneurysm without rupture (HCC) 12/23/2016  . Preoperative cardiovascular examination 12/23/2016  . Essential hypertension 09/23/2015  . Iron deficiency anemia 01/28/2015  . Weight loss 10/30/2014  . Hospital-acquired pneumonia 10/09/2014  . Ischemic leg 10/09/2014  . Aortic dissection (HCC) 10/07/2014    Past Surgical History:  Procedure Laterality Date  . ASCENDING AORTIC ROOT REPLACEMENT N/A 01/21/2017   Procedure: Replacement of Aortic Arch with circulatory arrest;  Surgeon: [name], MD;  Location: [facility] OR;  Service: Open Heart Surgery;  Laterality: N/A;  . CARDIAC CATHETERIZATION    . FEMORAL-FEMORAL BYPASS  GRAFT N/A 10/07/2014   Procedure: LEFT  FEMORAL ARTERY -RIGHT FEMORAL ARTERY BYPASS GRAFT USING X 30 CM HEMASHIELD GOLD GRAFT;  Surgeon: Larina Earthly, MD;  Location: [facility] OR;  Service: Vascular;  Laterality: N/A;  . RIGHT/LEFT HEART CATH AND CORONARY ANGIOGRAPHY N/A 01/11/2017   Procedure: RIGHT/LEFT HEART CATH AND CORONARY ANGIOGRAPHY- Diagnostic Only;  Surgeon: [name], MD;  Location: [facility] INVASIVE CV LAB;  Service: Cardiovascular;  Laterality: N/A;  . s/p thoracic aneurysm repair    . STERNOTOMY N/A 01/21/2017   Procedure: REDO STERNOTOMY;  Surgeon: [name], MD;  Location: [facility] OR;  Service: Open Heart Surgery;  Laterality: N/A;  . TEE WITHOUT CARDIOVERSION N/A 01/21/2017   Procedure: TRANSESOPHAGEAL ECHOCARDIOGRAM (TEE);  Surgeon: [name], MD;  Location: [facility] OR;  Service: Open Heart Surgery;  Laterality: N/A;  . THORACIC AORTIC ANEURYSM REPAIR N/A 10/06/2014   Procedure: THORACIC ASCENDING ANEURYSM REPAIR (AAA);  Surgeon: [name], MD;  Location: [facility] OR;  Service: Open Heart Surgery;  Laterality: N/A;  hyportermia circulatory arrest and resuspension of aortic valve  . THORACIC AORTIC ENDOVASCULAR STENT GRAFT N/A 01/21/2017   Procedure: THORACIC AORTIC ENDOVASCULAR STENT GRAFT;  Surgeon: Nada Libman, MD;  Location: [facility] OR;  Service: Vascular;  Laterality: N/A;  . THROMBECTOMY ILIAC ARTERY Left 03/08/2017   Procedure: THROMBECTOMY of Left subclavian artery;  Surgeon: Chuck Hint, MD;  Location: [facility] OR;  Service: Vascular;  Laterality: Left;  [facility] Kitchen VASCULAR SURGERY  Home Medications    Prior to Admission medications   Medication Sig Start Date End Date Taking? Authorizing Provider  aspirin 325 MG tablet Take 162.5 mg by mouth daily.   Yes [provider]  metoprolol tartrate (LOPRESSOR) 25 MG tablet Take 0.5 tablets (12.5 mg total) by mouth 2 (two) times daily. Hold dose if BP<100/60 or pulse<50 your are symptomatic 06/29/17  Yes  Ranelle Oyster, MD  aspirin 81 MG EC tablet Take 1 tablet (81 mg total) by mouth daily. Patient taking differently: Take 162 mg by mouth daily.  01/28/17   Gold, Wayne E, PA-C  cephALEXin (KEFLEX) 500 MG capsule Take 1 capsule (500 mg total) by mouth 3 (three) times daily. 02/09/18   Linwood Dibbles, MD  gabapentin (NEURONTIN) 100 MG capsule Take 1 capsule (100 mg total) by mouth daily. Take in the morning 02/09/18   Linwood Dibbles, MD  gabapentin (NEURONTIN) 300 MG capsule Take 1 capsule (300 mg total) by mouth at bedtime. 02/09/18   Linwood Dibbles, MD    Family History Family History  Problem Relation Age of Onset  . Heart murmur Mother     Social History Social History   Tobacco Use  . Smoking status: Former Smoker    Years: 35.00    Types: Cigarettes, Cigars    Last attempt to quit: 10/07/2014    Years since quitting: 3.3  . Smokeless tobacco: Never Used  Substance Use Topics  . Alcohol use: No    Alcohol/week: 0.0 standard drinks  . Drug use: No     Allergies   Patient has no known allergies.   Review of Systems Review of Systems  Constitutional: Negative for fever.  Eyes: Negative for visual disturbance.  Neurological: Positive for dizziness. Negative for tremors, seizures, speech difficulty, weakness and headaches.  All other systems reviewed and are negative.    Physical Exam Updated Vital Signs BP 140/83   Pulse 94   Temp 98.6 F (37 C) (Oral)   Resp 18   Ht 1.702 m (5\' 7" )   Wt 70.3 kg   SpO2 100%   BMI 24.28 kg/m   Physical Exam  Constitutional: He is oriented to person, place, and time. He appears well-developed and well-nourished. No distress.  HENT:  Head: Normocephalic and atraumatic.  Right Ear: External ear normal.  Left Ear: External ear normal.  Mouth/Throat: Oropharynx is clear and moist.  Eyes: Conjunctivae are normal. Right eye exhibits no discharge. Left eye exhibits no discharge. No scleral icterus.  Neck: Neck supple. No tracheal deviation  present.  Cardiovascular: Normal rate, regular rhythm and intact distal pulses.  Pulmonary/Chest: Effort normal and breath sounds normal. No stridor. No respiratory distress. He has no wheezes. He has no rales.  Abdominal: Soft. Bowel sounds are normal. He exhibits no distension. There is no tenderness. There is no rebound and no guarding.  Musculoskeletal: He exhibits no edema or tenderness.  Neurological: He is alert and oriented to person, place, and time. He has normal strength. No cranial nerve deficit (no facial droop, extraocular movements intact, no slurred speech) or sensory deficit. He exhibits normal muscle tone. He displays no seizure activity. Coordination normal.  No pronator drift bilateral upper extrem, unable to hold both either legg off bed for 5 seconds (old per patient), sensation intact in all extremities, no visual field cuts, no left or right sided neglect, normal finger-nose exam bilaterally, no nystagmus noted   Skin: Skin is warm and dry. No rash noted.  Psychiatric:  He has a normal mood and affect.  Nursing note and vitals reviewed.    ED Treatments / Results  Labs (all labs ordered are listed, but only abnormal results are displayed) Labs Reviewed  CBC - Abnormal; Notable for the following components:      Result Value   MCH 25.6 (*)    MCHC 29.8 (*)    RDW 16.1 (*)    All other components within normal limits  COMPREHENSIVE METABOLIC PANEL - Abnormal; Notable for the following components:   Glucose, Bld 102 (*)    All other components within normal limits  URINALYSIS, ROUTINE W REFLEX MICROSCOPIC - Abnormal; Notable for the following components:   APPearance HAZY (*)    Hgb urine dipstick SMALL (*)    Nitrite POSITIVE (*)    Leukocytes, UA MODERATE (*)    WBC, UA >50 (*)    Bacteria, UA MANY (*)    All other components within normal limits  RAPID URINE DRUG SCREEN, HOSP PERFORMED - Abnormal; Notable for the following components:   Opiates POSITIVE (*)     All other components within normal limits  PROTIME-INR  APTT  DIFFERENTIAL  ETHANOL  I-STAT TROPONIN, ED    EKG EKG Interpretation  Date/Time:  Thursday February 09 2018 12:17:51 EDT Ventricular Rate:  89 PR Interval:    QRS Duration: 75 QT Interval:  363 QTC Calculation: 442 R Axis:   51 Text Interpretation:  Sinus rhythm Premature ventricular complexes new since last tracing Left atrial enlargement Anteroseptal infarct, old Confirmed by Linwood Dibbles [phone]) on 02/09/2018 12:23:55 PM   Radiology Ct Head Wo Contrast  Result Date: 02/09/2018 CLINICAL DATA:  Dizziness, left-sided neurologic deficit. EXAM: CT HEAD WITHOUT CONTRAST TECHNIQUE: Contiguous axial images were obtained from the base of the skull through the vertex without intravenous contrast. COMPARISON:  None. FINDINGS: Brain: No evidence of acute infarction, hemorrhage, hydrocephalus, extra-axial collection or mass lesion/mass effect. Vascular: No hyperdense vessel or unexpected calcification. Skull: Normal. Negative for fracture or focal lesion. Sinuses/Orbits: No acute finding. Other: None. IMPRESSION: Normal head CT. Electronically Signed   By: Lupita Raider, M.D.   On: 02/09/2018 13:51    Procedures Procedures (including critical care time)  Medications Ordered in ED Medications  cephALEXin (KEFLEX) capsule 500 mg (has no administration in time range)     Initial Impression / Assessment and Plan / ED Course  I have reviewed the triage vital signs and the nursing notes.  Pertinent labs & imaging results that were available during my care of the patient were reviewed by me and considered in my medical decision making (see chart for details).  Clinical Course as of Feb 09 1610  Thu Feb 09, 2018  1605 Labs reviewed.  UA consistent with UTI.   [JK]    Clinical Course User Index [JK] Linwood Dibbles, MD  Labs reviewed.  UA consistent with UTI.  Labs and CT scan otherwise normal  The patient presented to the  emergency room with complaints of an episode of dizziness and nausea.  Pt has no new focal neuro deficits.  Pt is feeling better in the ED.   UA shows a uti.  Sx may have been vasovagal.  Will start pt on abx.  Follow up with PCP  Pt also asked about his neurontin for her neuropathic pain.  Will give him a refill  Final Clinical Impressions(s) / ED Diagnoses   Final diagnoses:  Acute cystitis without hematuria  Near syncope  ED Discharge Orders         Ordered    cephALEXin (KEFLEX) 500 MG capsule  3 times daily     02/09/18 1603    gabapentin (NEURONTIN) 300 MG capsule  Daily at bedtime     02/09/18 1603    gabapentin (NEURONTIN) 100 MG capsule  Daily     02/09/18 1603           Linwood Dibbles, MD 02/09/18 1611

## 2018-02-09 NOTE — Discharge Instructions (Signed)
Take the medications as prescribed for your neuropathy, take the antibiotics for the urinary tract infection, follow-up with your doctor next week to make sure you are improving, return to the emergency room for recurrent or worsening symptoms, fever or vomiting

## 2018-03-14 ENCOUNTER — Ambulatory Visit: Payer: Medicaid Other | Admitting: Adult Health

## 2018-03-14 ENCOUNTER — Encounter: Payer: Self-pay | Admitting: Adult Health

## 2018-03-14 VITALS — BP 142/75 | HR 89 | Ht 67.0 in | Wt 160.4 lb

## 2018-03-14 DIAGNOSIS — G9511 Acute infarction of spinal cord (embolic) (nonembolic): Secondary | ICD-10-CM | POA: Diagnosis not present

## 2018-03-14 DIAGNOSIS — R29898 Other symptoms and signs involving the musculoskeletal system: Secondary | ICD-10-CM | POA: Diagnosis not present

## 2018-03-14 DIAGNOSIS — M792 Neuralgia and neuritis, unspecified: Secondary | ICD-10-CM | POA: Diagnosis not present

## 2018-03-14 MED ORDER — DULOXETINE HCL 60 MG PO CPEP: 60.0000 mg | ORAL_CAPSULE | Freq: Every day | ORAL | 1 refills | Status: DC

## 2018-03-14 NOTE — Patient Instructions (Addendum)
Continue aspirin 162.5 mg daily (current regimen) for secondary stroke prevention  Continue to follow up with PCP regarding cholesterol and blood pressure management and monitoring   Start Cymbalta 60mg  daily. If after 1 month you continue to have nerve pain, please call office at that time  Look on the Internet for exercises regarding hip flexor weakness post stroke for exercises at home    Increase activity level during the day and maintain a healthy diet  Continue to monitor blood pressure at home  Maintain strict control of hypertension with blood pressure goal below 130/90, diabetes with hemoglobin A1c goal below 6.5% and cholesterol with LDL cholesterol (bad cholesterol) goal below 70 mg/dL. I also advised the patient to eat a healthy diet with plenty of whole grains, cereals, fruits and vegetables, exercise regularly and maintain ideal body weight.  Followup in the future with me in 6 months or call earlier if needed       Thank you for coming to see us at [facility]. I hope we have been able to provide you high quality care today.  You may receive a patient satisfaction survey over the next few weeks. We would appreciate your feedback and comments so that we may continue to improve ourselves and the health of our patients.

## 2018-03-14 NOTE — Progress Notes (Signed)
[facility] [address] [place]. [place] [address] [phone]       OFFICE FOLLOW-UP NOTE  Victor. Victor [name] Date of Birth:  November 19, 1955 Medical Record Number:  [id number]   HPI: Victor [name] is seen today for first office follow-up visit for hospital admission for paraplegia in November 2018. He is accompanied by his nephew and wife and history is obtained from them as well as review of electronic medical records. I have personally reviewed imaging films. Victor [name] is an 62 y.o. male who presented from home to [facility] on 03/07/2017 with acute lower extremity paralysis. He is a poor and inconsistent historian, requiring questions to be repeated multiple times for clarification many times during the interview. Best information obtained is as follows: He was in [facility] until about 3 weeks ago when he started to feel weak in the legs. He works as a Paediatric nursebarber and this would be noticed at times during the day, especially when attempting to stand up from a seated position. He denies having had any arm weakness, vision changes, confusion, trouble talking or sensory numbness during the 3 week time period. He does report having occasional SOB. This weekend, symptoms worsened on Saturday with trouble standing, so he decided to spend most of the day in bed watching TV. He had similar symptoms on Sunday, so he spent the day watching football on TV while lying in bed. He gives inconsistent reports regarding Sunday, at one point stating he spent the whole day in bed, and at another point stating that he could get up and walk. He states he lay in bed until about 1-2 AM on Monday morning (today), when he then tried to get up but legs gave out from under him. He fell to the floor per one account and slid downwards by another account. He could not get up after he slid/fell to the floor. He called out to his parents, who he lives with, and they called EMS. He denied bowel or bladder  incontinence, but states that for the past 3-4 weeks he has had trouble fully emptying his bladder. 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 His clinical exam showed a T10 level sensory level but flaccid paraparesis and MRI scan showed spinal cord hyperintensity from T9 down which was felt compatible with spinal cord infarct. A lumbar epidural drain was placed and he was also started on steroids. 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 Patient showed slight improvement and was able to move lower extremities slightly. No hospitalization. He was seen by physical occupational therapy and transferred for rehabilitation. He has been getting home physical and occupational therapy and plans to start outpatient therapy soon. He has been followed at rehabilitation clinic by Dr. Caren HazySwarts. He has shown some improvement and now is able to walk with a walker at home. He complains of some aching in burning pain in his legs and some constipation. He is able to empty his bladder quite well he has only occasional incontinence.  09/07/17 UPDATE: Patient returns today for 1341-month follow-up visit.  Patient has recently completed therapies next-door in her neuro rehab center and has an appointment on Monday at the [facility] for additional rehab.  Patient continues to take aspirin without side effects of bleeding or bruising.   blood pressure satisfactory at 113/73.  He is currently using a rolling walker for ambulation but does still continue to have bilateral lower extremity weakness.  Also has complaints of nerve pain.  He has a order for Neurontin 100 mg 3 times daily but states he is only been  taking this twice a day.  Reports that this does help him during the day but at night he has burning pain in bilateral lower extremities where it will wake him up in the middle the night.  Patient does endorse dizziness at time after sitting for prolonged period of time and then going to stand up.  This is been occurring since his stroke but has been slowly getting better.  Denies new or worsening stroke/TIA symptoms.  Interval history 03/14/2018: Patient is being seen today for six-month follow-up visit.  He does continue to experience neuropathy pain in bilateral lower extremities and has since stopped taking gabapentin as it did not help.  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  He does admit to not not doing exercises at recommended during therapy sessions and does state he will start to do those again.  He is currently using rolling walker due to continued lower extremity weakness but has made great improvements.  He has started driving again and has not had any complications doing so.  He continues to take aspirin 162.5 mg daily without side effects of bleeding or bruising.  Blood pressure today satisfactory 142/75.  No further concerns at this time.  Denies new or worsening stroke/TIA symptoms.   ROS:   14 system review of systems is positive for restless leg, daytime sleepiness, frequency of urination, back pain, aching muscles and dizziness and the following systems are negative  PMH:  Past Medical History:  Diagnosis Date  . History of kidney stones   . Hypertension   . 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 Stroke (HCC)   . Thoracic aortic aneurysm (HCC) 09/2014    Social History:  Social History   Socioeconomic History  . Marital status: Single    Spouse name: Not on file  . Number of children: Not on file  . Years of education: Not on file  . Highest education level: Not on file  Occupational History  . Not on file    Social Needs  . Financial resource strain: Not on file  . Food insecurity:    Worry: Not on file    Inability: Not on file  . Transportation needs:    Medical: Not on file    Non-medical: Not on file  Tobacco Use  . Smoking status: Former Smoker    Years: 35.00    Types: Cigarettes, Cigars    Last attempt to quit: 10/07/2014    Years since quitting: 3.4  . Smokeless tobacco: Never Used  Substance and Sexual Activity  . Alcohol use: No    Alcohol/week: 0.0 standard drinks  . Drug use: No  . Sexual activity: Not on file  Lifestyle  . Physical activity:    Days per week: Not on file    Minutes per session: Not on file  . Stress: Not on file  Relationships  . Social connections:    Talks on phone: Not on file    Gets together: Not on file    Attends religious service: Not on file  Active member of club or organization: Not on file    Attends meetings of clubs or organizations: Not on file    Relationship status: Not on file  . Intimate partner violence:    Fear of current or ex partner: Not on file    Emotionally abused: Not on file    Physically abused: Not on file    Forced sexual activity: Not on file  Other Topics Concern  . Not on file  Social History Narrative  . Not on file    Medications:   Current Outpatient Medications on File Prior to Visit  Medication Sig Dispense Refill  . aspirin 325 MG tablet Take 162.5 mg by mouth daily.    . metoprolol tartrate (LOPRESSOR) 25 MG tablet Take 0.5 tablets (12.5 mg total) by mouth 2 (two) times daily. Hold dose if BP<100/60 or pulse<50 your are symptomatic 60 tablet 4   No current facility-administered medications on file prior to visit.     Allergies:  No Known Allergies  Physical Exam General: Pleasant middle-aged African American male, seated, in no evident distress Head: head normocephalic and atraumatic.  Neck: supple with no carotid or supraclavicular bruits Cardiovascular: regular rate and rhythm, no  murmurs Musculoskeletal: no deformity Skin:  no rash/petichiae Vascular:  Normal pulses all extremities Vitals:   03/14/18 1431  BP: (!) 142/75  Pulse: 89   Neurologic Exam Mental Status: Awake and fully alert. Oriented to place and time. Recent and remote memory intact. Attention span, concentration and fund of knowledge appropriate. Mood and affect appropriate.  Cranial Nerves: Fundoscopic exam reveals sharp disc margins. Pupils equal, briskly reactive to light. Extraocular movements full without nystagmus. Visual fields full to confrontation. Hearing intact. Facial sensation intact. Face, tongue, palate moves normally and symmetrically.  Motor: LLE: 4-/5; RLE: 4+/5.  Weakness greater in bilateral lower extremity hip flexors.  Full strength bilateral upper extremities. Sensory.: intact to touch ,pinprick .position and vibratory sensation.  Coordination: Rapid alternating movements normal in all extremities. Finger-to-nose and heel-to-shin performed accurately bilaterally. Gait and Station: Patient ambulating using rolling walker; unable to test tandem gait; patient does walk with stiffened left leg Reflexes: Deep tendon reflexes are brisk throughout.  Toes downgoing.       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  Patient is being seen today for follow-up visit and does continue to have bilateral lower extremity weakness along with paresthesias but this has been improving and denies any new or worsening stroke/TIA symptoms.   PLAN: -Continue aspirin 81 mg daily for secondary stroke prevention -F/u with PCP regarding your blood pressure management -Start Cymbalta 60 mg daily for continued nerve pain -continue to monitor BP at home -Highly encouraged to start home exercises as recommended during therapy sessions for continued BLE weakness -Advised to monitor  blood pressure at home -Maintain strict control of hypertension with blood pressure goal below 130/90, diabetes with hemoglobin A1c goal below 6.5% and cholesterol with LDL cholesterol (bad cholesterol) goal below 70 mg/dL. I also advised the patient to eat a healthy diet with plenty of whole grains, cereals, fruits and vegetables, exercise regularly and maintain ideal body weight.  Follow up in 6 months or call earlier if needed  Greater than 50% of time during this 25 minute visit was spent on counseling,explanation of diagnosis of spinal cord infarct and paraplegia, planning of further management, discussion with patient and coordination of care  [place]  Elenora FenderVanSchaick, AGNP-BC  [facility] [address] [place], [place] [id number]  Phone [phone] Fax [phone]

## 2018-03-15 NOTE — Progress Notes (Signed)
I agree with the above plan 

## 2018-04-12 ENCOUNTER — Telehealth: Payer: Self-pay | Admitting: *Deleted

## 2018-04-12 NOTE — Telephone Encounter (Signed)
Patient stopped by the office to request a stronger medication for his legs.  The medication prescribed is not helping.  Also needs something for dizziness.  [facility].  Call either the home or cell number if you have questions.

## 2018-04-12 NOTE — Telephone Encounter (Signed)
Left vm for patient to call back about needing something stronger than Cymbalta for his legs. Pt complain of dizziness to front desk. PEr Shanda BumpsJessica NP last note there is no complaint of the pt of having dizzy spells. Rn requested a call back. [phone]) is the pts number.

## 2018-04-12 NOTE — Telephone Encounter (Signed)
Please call patient to schedule follow-up appointment due to new complaints of dizziness.  This was not brought up at prior appointment and unsure if this is related to medication side effect due to initiating Cymbalta or if something else is going on and will need to obtain additional information regarding dizziness complaint.  We can also speak about initiating a different medication for his continued paresthesias.

## 2018-04-12 NOTE — Telephone Encounter (Signed)
Pt returning RN's call.

## 2018-04-13 NOTE — Telephone Encounter (Addendum)
Rn call patient back about complaints of dizzy spells. Rn stated per Shanda BumpsJEssica NP and her note at last visit dizzy spells was not a issue or complaint. PT stated Shanda BumpsJessica NP prescribed him medication for dizzy spells. Rn stated per last two visits there is no mention on dizzy spells or medication prescribed by her.. Rn ask pt if he knew the name of the medication. PT stated he was out of the medication, and thinks his mom might of gave him something for dizzy spells.Pt stated he was told by Shanda BumpsJessica NP to call if the Cymbalta medication was not working. PT stated he stop the medication weeks ago. Rn stated  Shanda BumpsJessica NP wants him to schedule another appt for new complaints of dizzy spells, and increase pain in his legs. PT schedule for 04/14/2018 at 1100 check in time at 1030am. Pt verbalized understanding, and appt in May 2020 will stay.

## 2018-04-13 NOTE — Telephone Encounter (Signed)
Revised. 

## 2018-04-14 ENCOUNTER — Encounter: Payer: Self-pay | Admitting: Adult Health

## 2018-04-14 ENCOUNTER — Ambulatory Visit: Payer: Medicaid Other | Admitting: Adult Health

## 2018-04-14 VITALS — BP 129/82 | HR 95 | Wt 156.2 lb

## 2018-04-14 DIAGNOSIS — R29898 Other symptoms and signs involving the musculoskeletal system: Secondary | ICD-10-CM

## 2018-04-14 DIAGNOSIS — G9511 Acute infarction of spinal cord (embolic) (nonembolic): Secondary | ICD-10-CM | POA: Diagnosis not present

## 2018-04-14 DIAGNOSIS — M792 Neuralgia and neuritis, unspecified: Secondary | ICD-10-CM | POA: Diagnosis not present

## 2018-04-14 DIAGNOSIS — I10 Essential (primary) hypertension: Secondary | ICD-10-CM | POA: Diagnosis not present

## 2018-04-14 MED ORDER — DULOXETINE HCL 60 MG PO CPEP: 60.0000 mg | ORAL_CAPSULE | Freq: Every day | ORAL | 1 refills | Status: DC

## 2018-04-14 MED ORDER — GABAPENTIN 300 MG PO CAPS: 300.0000 mg | ORAL_CAPSULE | Freq: Three times a day (TID) | ORAL | 3 refills | Status: DC

## 2018-04-14 NOTE — Progress Notes (Signed)
[facility] [address] [place]. [place] [id number] [phone]       OFFICE FOLLOW-UP NOTE  Mr. Victor ChristmasMarcus [name] Ealy Date of Birth:  November 19, 1955 Medical Record Number:  [id number]   HPI: Mr Victor [name] is seen today for first office follow-up visit for hospital admission for paraplegia in November 2018. He is accompanied by his nephew and wife and history is obtained from them as well as review of electronic medical records. I have personally reviewed imaging films. Victor ChristmasMarcus [name] Mirando is an 62 y.o. male who presented from home to [facility] on 03/07/2017 with acute lower extremity paralysis. He is a poor and inconsistent historian, requiring questions to be repeated multiple times for clarification many times during the interview. Best information obtained is as follows: He was in [facility] until about 3 weeks ago when he started to feel weak in the legs. He works as a Paediatric nursebarber and this would be noticed at times during the day, especially when attempting to stand up from a seated position. He denies having had any arm weakness, vision changes, confusion, trouble talking or sensory numbness during the 3 week time period. He does report having occasional SOB. This weekend, symptoms worsened on Saturday with trouble standing, so he decided to spend most of the day in bed watching TV. He had similar symptoms on Sunday, so he spent the day watching football on TV while lying in bed. He gives inconsistent reports regarding Sunday, at one point stating he spent the whole day in bed, and at another point stating that he could get up and walk. He states he lay in bed until about 1-2 AM on Monday morning (today), when he then tried to get up but legs gave out from under him. He fell to the floor per one account and slid downwards by another account. He could not get up after he slid/fell to the floor. He called out to his parents, who he lives with, and they called EMS. He denied bowel or bladder  incontinence, but states that for the past 3-4 weeks he has had trouble fully emptying his bladder. 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 His clinical exam showed a T10 level sensory level but flaccid paraparesis and MRI scan showed spinal cord hyperintensity from T9 down which was felt compatible with spinal cord infarct. A lumbar epidural drain was placed and he was also started on steroids. 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 Patient showed slight improvement and was able to move lower extremities slightly. No hospitalization. He was seen by physical occupational therapy and transferred for rehabilitation. He has been getting home physical and occupational therapy and plans to start outpatient therapy soon. He has been followed at rehabilitation clinic by Dr. Caren HazySwarts. He has shown some improvement and now is able to walk with a walker at home. He complains of some aching in burning pain in his legs and some constipation. He is able to empty his bladder quite well he has only occasional incontinence.  09/07/17 UPDATE: Patient returns today for 1341-month follow-up visit.  Patient has recently completed therapies next-door in her neuro rehab center and has an appointment on Monday at the [facility] for additional rehab.  Patient continues to take aspirin without side effects of bleeding or bruising.   blood pressure satisfactory at 113/73.  He is currently using a rolling walker for ambulation but does still continue to have bilateral lower extremity weakness.  Also has complaints of nerve pain.  He has a order for Neurontin 100 mg 3 times daily but states he is only been  taking this twice a day.  Reports that this does help him during the day but at night he has burning pain in bilateral lower extremities where it will wake him up in the middle the night.  Patient does endorse dizziness at time after sitting for prolonged period of time and then going to stand up.  This is been occurring since his stroke but has been slowly getting better.  Denies new or worsening stroke/TIA symptoms.  03/14/2018: Patient is being seen today for six-month follow-up visit.  He does continue to experience neuropathy pain in bilateral lower extremities and has since stopped taking gabapentin as it did not help.  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  He does admit to not not doing exercises at recommended during therapy sessions and does state he will start to do those again.  He is currently using rolling walker due to continued lower extremity weakness but has made great improvements.  He has started driving again and has not had any complications doing so.  He continues to take aspirin 162.5 mg daily without side effects of bleeding or bruising.  Blood pressure today satisfactory 142/75.  No further concerns at this time.  Denies new or worsening stroke/TIA symptoms.  Interval history 04/14/18: Patient is being seen today due to complaints of dizziness and no benefit of neuropathy pain with starting Cymbalta.  He does complain of dizziness sensation which he states has been occurring since hospital discharge.  He denies any worsening of this but when he informed office staff of his dizziness prompting this appointment, he was requesting medication to help with the dizziness.  He does endorse this happening intermittently and typically worsens upon position changing.  He states he did trial Cymbalta but did not notice any benefit therefore he stopped.  Question how long he was taking this medication and states he stopped taking a month  ago but advised patient that it was only started a month ago.  Once this was brought to patient's attention, he was unable to tell me exactly how long he continued this medication before prior to stopping.  Advised patient that with neuropathy pain especially in his case, it can take a little while for these medications to take effect and is difficult to determine how much benefit he will gain with the medications.  He is agreeable to restarting his medications and to trial them for a longer period of time.  No further concerns at this time.    ROS:   14 system review of systems is positive for dizziness, restless leg and daytime sleepiness and the following systems are negative  PMH:  Past Medical History:  Diagnosis Date  . History of kidney stones   . Hypertension   . 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 Stroke (HCC)   . Thoracic aortic aneurysm (HCC) 09/2014    Social History:  Social History   Socioeconomic History  . Marital status: Single    Spouse name: Not on file  . Number of children: Not on file  . Years of  education: Not on file  . Highest education level: Not on file  Occupational History  . Not on file  Social Needs  . Financial resource strain: Not on file  . Food insecurity:    Worry: Not on file    Inability: Not on file  . Transportation needs:    Medical: Not on file    Non-medical: Not on file  Tobacco Use  . Smoking status: Former Smoker    Years: 35.00    Types: Cigarettes, Cigars    Last attempt to quit: 10/07/2014    Years since quitting: 3.5  . Smokeless tobacco: Never Used  Substance and Sexual Activity  . Alcohol use: No    Alcohol/week: 0.0 standard drinks  . Drug use: No  . Sexual activity: Not on file  Lifestyle  . Physical activity:    Days per week: Not on file    Minutes per session: Not on file  . Stress: Not on file  Relationships  . Social connections:    Talks on phone: Not on file    Gets together: Not on file    Attends  religious service: Not on file    Active member of club or organization: Not on file    Attends meetings of clubs or organizations: Not on file    Relationship status: Not on file  . Intimate partner violence:    Fear of current or ex partner: Not on file    Emotionally abused: Not on file    Physically abused: Not on file    Forced sexual activity: Not on file  Other Topics Concern  . Not on file  Social History Narrative  . Not on file    Medications:   Current Outpatient Medications on File Prior to Visit  Medication Sig Dispense Refill  . aspirin 325 MG tablet Take 162.5 mg by mouth daily.    . metoprolol tartrate (LOPRESSOR) 25 MG tablet Take 0.5 tablets (12.5 mg total) by mouth 2 (two) times daily. Hold dose if BP<100/60 or pulse<50 your are symptomatic 60 tablet 4   No current facility-administered medications on file prior to visit.     Allergies:   Allergies  Allergen Reactions  . Gabapentin     Did not help his nerve pain in legs    Physical Exam General: Pleasant middle-aged African American male, seated, in no evident distress Head: head normocephalic and atraumatic.  Neck: supple with no carotid or supraclavicular bruits Cardiovascular: regular rate and rhythm, no murmurs Musculoskeletal: no deformity Skin:  no rash/petichiae Vascular:  Normal pulses all extremities Vitals:   04/14/18 1130  BP: 129/82  Pulse: 95   Neurologic Exam Mental Status: Awake and fully alert. Oriented to place and time. Recent and remote memory intact. Attention span, concentration and fund of knowledge appropriate. Mood and affect appropriate.  Cranial Nerves: Pupils equal, briskly reactive to light. Extraocular movements full without nystagmus. Visual fields full to confrontation. Hearing intact. Facial sensation intact. Face, tongue, palate moves normally and symmetrically.  Motor: LLE: 4/5; RLE: 4/5.  Weakness greater in bilateral lower extremity hip flexors and ankle  dorsiflexion.  Full strength bilateral upper extremities. Sensory.: intact to touch ,pinprick .position and vibratory sensation.  Coordination: Rapid alternating movements normal in all extremities. Finger-to-nose and heel-to-shin performed accurately bilaterally. Gait and Station: Patient ambulating using rolling walker; unable to test tandem gait; patient does walk with stiffened left leg Reflexes: Deep tendon reflexes are brisk throughout.  Toes downgoing.  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  Patient is being seen today as requested due to complaints of Cymbalta not providing relief of neuropathy and appear to be worsening dizziness sensation.   PLAN: -Continue aspirin 81 mg daily for secondary stroke prevention -F/u with PCP regarding your blood pressure management -Advised patient to restart Cymbalta 60 mg daily along with reinitiating gabapentin 300 mg twice daily and if tolerating well without side effects he can increase this to 3 times daily after 1 week's time.  Highly encouraged him to continue taking these at least for the next 2 months and to call office if he continues to experience pain we can consider increasing at that time.  Highly encouraged him not to discontinue use of these medications without calling our office first.  Patient verbalized agreement with this plan. -continue to monitor BP at home -Advised to continue to do home therapy exercises for continued bilateral lower extremity weakness and intermittent dizziness sensation -Advised to monitor blood pressure at home -Maintain strict control of hypertension with blood pressure goal below 130/90, diabetes with hemoglobin A1c goal below 6.5% and cholesterol with LDL cholesterol (bad cholesterol) goal below 70 mg/dL. I also advised the patient to eat a healthy diet with plenty of whole  grains, cereals, fruits and vegetables, exercise regularly and maintain ideal body weight.  Follow up in 3 months or call earlier if needed  Greater than 50% of time during this 25 minute visit was spent on counseling,explanation of diagnosis of spinal cord infarct and paraplegia, planning of further management, discussion with patient and coordination of care  George Hugh, [facility]  [facility] [address] [place], [place] [address]  Phone [phone] Fax [phone]

## 2018-04-14 NOTE — Patient Instructions (Signed)
Continue aspirin 325 mg daily  for secondary stroke prevention  Continue to follow up with PCP regarding cholesterol and blood pressure management   Restart cymbalta 60mg  daily and gabapentin 300mg  twice a day. If you tolerate gabapentin well without side effects after 1 week time, you can start to take a 300mg  capsule 3 times daily.   Continue to monitor blood pressure at home  Maintain strict control of hypertension with blood pressure goal below 130/90, diabetes with hemoglobin A1c goal below 6.5% and cholesterol with LDL cholesterol (bad cholesterol) goal below 70 mg/dL. I also advised the patient to eat a healthy diet with plenty of whole grains, cereals, fruits and vegetables, exercise regularly and maintain ideal body weight.  Followup in the future with me in 3 months or call earlier if needed       Thank you for coming to see us at [facility]. I hope we have been able to provide you high quality care today.  You may receive a patient satisfaction survey over the next few weeks. We would appreciate your feedback and comments so that we may continue to improve ourselves and the health of our patients.

## 2018-04-24 ENCOUNTER — Telehealth: Payer: Self-pay | Admitting: Adult Health

## 2018-04-24 NOTE — Telephone Encounter (Signed)
Please advise patient that the symptoms are typically not caused by gabapentin or Cymbalta.  Please advise patient that he should call his PCP for further evaluation of these concerns. Thank you.

## 2018-04-24 NOTE — Telephone Encounter (Addendum)
Rn call patient that per Shanda BumpsJessica NP reviewed his chart and message about gabapentin or cymbalta causing hiccups.Also not being able to make it to the bathroom. Rn stated per Shanda BumpsJessica NP cymbalta and gabapentin typically do not cause hiccups. Pt stated the hiccups stop about a hour or two ago. Pt thinks he has a UTI. Rn advised pt to call his PCP, and if they are not available to seek the urgent care for a possible UTI. RN also advised pt to keep taking the cymbalta, and gabapentin as Shanda BumpsJessica NP prescribed. PT verbalized understanding.

## 2018-04-24 NOTE — Telephone Encounter (Signed)
Pt has called back asking that he be called on [phone]

## 2018-04-24 NOTE — Telephone Encounter (Signed)
Revised. 

## 2018-04-24 NOTE — Telephone Encounter (Signed)
Patient has had the hiccups all night and not able to make it to the bathroom to urinate.  Could this be coming from gabapentin (NEURONTIN) 300 MG capsule and DULoxetine (CYMBALTA) 60 MG capsule. Please call and discuss because he is very uncnuomfortable.

## 2018-04-25 ENCOUNTER — Other Ambulatory Visit: Payer: Self-pay

## 2018-04-25 ENCOUNTER — Ambulatory Visit
Admission: EM | Admit: 2018-04-25 | Discharge: 2018-04-25 | Disposition: A | Discharge status: Home / Self Care | Payer: Medicaid Other | Attending: Physician Assistant | Admitting: Physician Assistant

## 2018-04-25 DIAGNOSIS — R066 Hiccough: Secondary | ICD-10-CM | POA: Diagnosis not present

## 2018-04-25 DIAGNOSIS — N390 Urinary tract infection, site not specified: Secondary | ICD-10-CM | POA: Diagnosis not present

## 2018-04-25 DIAGNOSIS — N3090 Cystitis, unspecified without hematuria: Secondary | ICD-10-CM

## 2018-04-25 LAB — POCT URINALYSIS DIP (MANUAL ENTRY)
GLUCOSE UA: NEGATIVE mg/dL
LEUKOCYTES UA: NEGATIVE
NITRITE UA: POSITIVE — AB
Protein Ur, POC: 100 mg/dL — AB
Spec Grav, UA: >=1.03 — AB (ref 1.010–1.025)
Urobilinogen, UA: 0.2 E.U./dL
pH, UA: 5 (ref 5.0–8.0)

## 2018-04-25 MED ORDER — BACLOFEN 5 MG PO TABS: 1.0000 | ORAL_TABLET | Freq: Three times a day (TID) | ORAL | 0 refills | Status: DC

## 2018-04-25 MED ORDER — OMEPRAZOLE 20 MG PO CPDR: 20.0000 mg | DELAYED_RELEASE_CAPSULE | Freq: Every day | ORAL | 0 refills | Status: DC

## 2018-04-25 MED ORDER — FAMOTIDINE 20 MG PO TABS
20.0000 mg | ORAL_TABLET | Freq: Once | ORAL | Status: AC
  Administered 2018-04-25: 20 mg via ORAL

## 2018-04-25 MED ORDER — CEPHALEXIN 500 MG PO CAPS: 500.0000 mg | ORAL_CAPSULE | Freq: Two times a day (BID) | ORAL | 0 refills | Status: DC

## 2018-04-25 NOTE — Discharge Instructions (Addendum)
Start omeprazole as directed for possible acid reflux causing hiccups. You can also take baclofen as directed to help. Avoid spicy foods for now. Small frequent meal instead of big meals. If continues with hiccups, despite medicine in the next few days, please follow up with PCP for further evaluation needed.  Your urine was positive for an urinary tract infection. Start keflex as directed. Keep hydrated, urine should be clear to pale yellow in color. Monitor for any worsening of symptoms, fever, worsening abdominal pain, nausea/vomiting, flank pain, follow up for reevaluation.

## 2018-04-25 NOTE — ED Triage Notes (Signed)
Pt c/o hiccups x2 days and having urinary frequency and incontinence for "a long time"; pt also c/o bodyaches and a start of a cold

## 2018-04-25 NOTE — ED Provider Notes (Signed)
[facility]    CSN: [id number] Arrival date & time: 04/25/18  1621     History   Chief Complaint Chief Complaint  Patient presents with  . Hiccups    HPI Victor [name] is a 62 y.o. male.   62 year old male with history of aortic aneurysm rupture s/p repair, spinal cord infarction, paraplegia comes in for 2 day history of hiccups. States was laying down when symptom started. He called his neurologist as he was recently started on gabapentin, and was told medication did not cause hiccups. At that time, he informed neurologist that the hiccups had resolved. He is unsure when the hiccups restarted. They are constant, no obvious aggravating or alleviating factor. He denies abdominal pain, nausea, vomiting. Denies chest pain, shortness of breath, palpitations. Denies weakness, dizziness, syncope. He walks with a walker, and denies increased imbalance. He is still able to eat and drink.   He also complains of urinary frequency that has been going on for "a long time". Denies dysuria, hematuria. Denies loss of bladder or bowel control, states will have urgency, and sometimes does not have time to reach the restroom.      Past Medical History:  Diagnosis Date  . History of kidney stones   . Hypertension   . 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 Stroke (HCC)   . Thoracic aortic aneurysm (HCC) 09/2014    Patient Active Problem List   Diagnosis Date Noted  . Unilateral inguinal hernia without obstruction or gangrene 06/29/2017  . Nerve pain 05/18/2017  . Spinal cord infarction (HCC) 03/15/2017  . Paraplegia (HCC) 03/07/2017  . Bilateral leg weakness 03/07/2017  . S/P thoracic aortic aneurysm repair 01/21/2017  . Coronary artery calcification 12/23/2016  . S/P aortic dissection repair 12/23/2016  . Thoracic aortic aneurysm without rupture (HCC) 12/23/2016  . Preoperative cardiovascular examination 12/23/2016  . Essential hypertension 09/23/2015  . Iron deficiency  anemia 01/28/2015  . Weight loss 10/30/2014  . Hospital-acquired pneumonia 10/09/2014  . Ischemic leg 10/09/2014  . Aortic dissection (HCC) 10/07/2014    Past Surgical History:  Procedure Laterality Date  . ASCENDING AORTIC ROOT REPLACEMENT N/A 01/21/2017   Procedure: Replacement of Aortic Arch with circulatory arrest;  Surgeon: [name], MD;  Location: [facility] OR;  Service: Open Heart Surgery;  Laterality: N/A;  . CARDIAC CATHETERIZATION    . FEMORAL-FEMORAL BYPASS GRAFT N/A 10/07/2014   Procedure: LEFT  FEMORAL ARTERY -RIGHT FEMORAL ARTERY BYPASS GRAFT USING X 30 CM HEMASHIELD GOLD GRAFT;  Surgeon: Larina Earthly, MD;  Location: [facility] OR;  Service: Vascular;  Laterality: N/A;  . RIGHT/LEFT HEART CATH AND CORONARY ANGIOGRAPHY N/A 01/11/2017   Procedure: RIGHT/LEFT HEART CATH AND CORONARY ANGIOGRAPHY- Diagnostic Only;  Surgeon: [name], MD;  Location: [facility] INVASIVE CV LAB;  Service: Cardiovascular;  Laterality: N/A;  . s/p thoracic aneurysm repair    . STERNOTOMY N/A 01/21/2017   Procedure: REDO STERNOTOMY;  Surgeon: [name], MD;  Location: [facility] OR;  Service: Open Heart Surgery;  Laterality: N/A;  . TEE WITHOUT CARDIOVERSION N/A 01/21/2017   Procedure: TRANSESOPHAGEAL ECHOCARDIOGRAM (TEE);  Surgeon: [name], MD;  Location: [facility] OR;  Service: Open Heart Surgery;  Laterality: N/A;  . THORACIC AORTIC ANEURYSM REPAIR N/A 10/06/2014   Procedure: THORACIC ASCENDING ANEURYSM REPAIR (AAA);  Surgeon: [name], MD;  Location: [facility] OR;  Service: Open Heart Surgery;  Laterality: N/A;  hyportermia circulatory arrest and resuspension of aortic valve  . THORACIC  AORTIC ENDOVASCULAR STENT GRAFT N/A 01/21/2017   Procedure: THORACIC AORTIC ENDOVASCULAR STENT GRAFT;  Surgeon: Nada Libman, MD;  Location: [facility] OR;  Service: Vascular;  Laterality: N/A;  . THROMBECTOMY ILIAC ARTERY Left 03/08/2017   Procedure: THROMBECTOMY of Left subclavian artery;  Surgeon: Chuck Hint,  MD;  Location: [facility] OR;  Service: Vascular;  Laterality: Left;  Marland Kitchen VASCULAR SURGERY         Home Medications    Prior to Admission medications   Medication Sig Start Date End Date Taking? Authorizing Provider  aspirin 325 MG tablet Take 162.5 mg by mouth daily.    [provider]  Baclofen 5 MG TABS Take 1 tablet by mouth 3 (three) times daily. 04/25/18   Cathie Hoops, Srihan Brutus V, PA-C  cephALEXin (KEFLEX) 500 MG capsule Take 1 capsule (500 mg total) by mouth 2 (two) times daily. 04/25/18   Cathie Hoops, Crystalann Korf V, PA-C  DULoxetine (CYMBALTA) 60 MG capsule Take 1 capsule (60 mg total) by mouth daily. 04/14/18   George Hugh, NP  gabapentin (NEURONTIN) 300 MG capsule Take 1 capsule (300 mg total) by mouth 3 (three) times daily. 04/14/18   George Hugh, NP  metoprolol tartrate (LOPRESSOR) 25 MG tablet Take 0.5 tablets (12.5 mg total) by mouth 2 (two) times daily. Hold dose if BP<100/60 or pulse<50 your are symptomatic 06/29/17   Ranelle Oyster, MD  omeprazole (PRILOSEC) 20 MG capsule Take 1 capsule (20 mg total) by mouth daily. 04/25/18   Belinda Fisher, PA-C    Family History Family History  Problem Relation Age of Onset  . Heart murmur Mother     Social History Social History   Tobacco Use  . Smoking status: Former Smoker    Years: 35.00    Types: Cigarettes, Cigars    Last attempt to quit: 10/07/2014    Years since quitting: 3.5  . Smokeless tobacco: Never Used  Substance Use Topics  . Alcohol use: No    Alcohol/week: 0.0 standard drinks  . Drug use: No     Allergies   Patient has no active allergies.   Review of Systems Review of Systems  Reason unable to perform ROS: See HPI as above.     Physical Exam Triage Vital Signs ED Triage Vitals [04/25/18 1631]  Enc Vitals Group     BP 105/76     Pulse Rate (!) 114     Resp 20     Temp 99.7 F (37.6 C)     Temp src      SpO2 97 %     Weight      Height      Head Circumference      Peak Flow      Pain Score 0     Pain  Loc      Pain Edu?      Excl. in GC?    No data found.  Updated Vital Signs BP 105/76 (BP Location: Right Arm)   Pulse (!) 114   Temp 99.7 F (37.6 C)   Resp 20   SpO2 97%   Physical Exam Constitutional:      General: He is not in acute distress.    Appearance: He is well-developed. He is not ill-appearing, toxic-appearing or diaphoretic.     Comments: Patient hiccups throughout exam.   HENT:     Head: Normocephalic and atraumatic.     Right Ear: Tympanic membrane, ear canal and external ear normal. Tympanic membrane is not erythematous  or bulging.     Left Ear: Tympanic membrane, ear canal and external ear normal. Tympanic membrane is not erythematous or bulging.     Nose: Nose normal.     Right Sinus: No maxillary sinus tenderness or frontal sinus tenderness.     Left Sinus: No maxillary sinus tenderness or frontal sinus tenderness.     Mouth/Throat:     Pharynx: Uvula midline.  Eyes:     Conjunctiva/sclera: Conjunctivae normal.     Pupils: Pupils are equal, round, and reactive to light.  Neck:     Musculoskeletal: Normal range of motion and neck supple.  Cardiovascular:     Rate and Rhythm: Normal rate and regular rhythm.     Heart sounds: Normal heart sounds. No murmur. No friction rub. No gallop.      Comments: Patient without tachycardia during exam.  Pulmonary:     Effort: Pulmonary effort is normal. No respiratory distress.     Breath sounds: Normal breath sounds. No stridor. No decreased breath sounds, wheezing, rhonchi or rales.  Lymphadenopathy:     Cervical: No cervical adenopathy.  Skin:    General: Skin is warm and dry.  Neurological:     Mental Status: He is alert and oriented to person, place, and time.     GCS: GCS eye subscore is 4. GCS verbal subscore is 5. GCS motor subscore is 6.     Cranial Nerves: Cranial nerves are intact.     Sensory: Sensation is intact.     Comments: Patient walks with a walker. States gait is at baseline. Normal grip  strength bilaterally. Normal finger to nose, heel to shin.  Psychiatric:        Behavior: Behavior normal.        Judgment: Judgment normal.      UC Treatments / Results  Labs (all labs ordered are listed, but only abnormal results are displayed) Labs Reviewed  POCT URINALYSIS DIP (MANUAL ENTRY) - Abnormal; Notable for the following components:      Result Value   Bilirubin, UA moderate (*)    Ketones, POC UA trace (5) (*)    Spec Grav, UA >=1.030 (*)    Blood, UA moderate (*)    Protein Ur, POC =100 (*)    Nitrite, UA Positive (*)    All other components within normal limits  URINE CULTURE    EKG None  Radiology No results found.  Procedures Procedures (including critical care time)  Medications Ordered in UC Medications  famotidine (PEPCID) tablet 20 mg (20 mg Oral Given 04/25/18 1703)    Initial Impression / Assessment and Plan / UC Course  I have reviewed the triage vital signs and the nursing notes.  Pertinent labs & imaging results that were available during my care of the patient were reviewed by me and considered in my medical decision making (see chart for details).    Case discussed with Dr Tracie HarrierHagler, who suggested PPI and baclofen for symptoms. Will have patient monitor, and follow up with PCP if symptoms not improving. Return precautions given.  Urine dipstick positive for UTI. Start antibiotics as directed. Push fluids. Return precautions given.  Final Clinical Impressions(s) / UC Diagnoses   Final diagnoses:  Hiccups  Cystitis    ED Prescriptions    Medication Sig Dispense Auth. Provider   omeprazole (PRILOSEC) 20 MG capsule Take 1 capsule (20 mg total) by mouth daily. 10 capsule Cathie HoopsYu, Endora Teresi V, PA-C   Baclofen 5 MG TABS Take 1  tablet by mouth 3 (three) times daily. 15 tablet Davy Westmoreland V, PA-C   cephALEXin (KEFLEX) 500 MG capsule Take 1 capsule (500 mg total) by mouth 2 (two) times daily. 14 capsule Threasa AlphaYu, Ashtyn Meland V, PA-C        Sumedh Shinsato V, [place] JerseyPA-C 04/25/18  1712

## 2018-04-27 ENCOUNTER — Emergency Department (HOSPITAL_COMMUNITY): Payer: Medicaid Other

## 2018-04-27 ENCOUNTER — Emergency Department (HOSPITAL_COMMUNITY)
Admission: EM | Admit: 2018-04-27 | Discharge: 2018-04-27 | Disposition: A | Discharge status: Home / Self Care | Payer: Medicaid Other | Attending: Emergency Medicine | Admitting: Emergency Medicine

## 2018-04-27 ENCOUNTER — Encounter (HOSPITAL_COMMUNITY): Payer: Self-pay

## 2018-04-27 DIAGNOSIS — Z7982 Long term (current) use of aspirin: Secondary | ICD-10-CM | POA: Insufficient documentation

## 2018-04-27 DIAGNOSIS — R140 Abdominal distension (gaseous): Secondary | ICD-10-CM | POA: Diagnosis not present

## 2018-04-27 DIAGNOSIS — R000 Tachycardia, unspecified: Secondary | ICD-10-CM | POA: Diagnosis not present

## 2018-04-27 DIAGNOSIS — Z79899 Other long term (current) drug therapy: Secondary | ICD-10-CM | POA: Insufficient documentation

## 2018-04-27 DIAGNOSIS — I10 Essential (primary) hypertension: Secondary | ICD-10-CM | POA: Diagnosis not present

## 2018-04-27 DIAGNOSIS — Z87891 Personal history of nicotine dependence: Secondary | ICD-10-CM | POA: Diagnosis not present

## 2018-04-27 DIAGNOSIS — R69 Illness, unspecified: Secondary | ICD-10-CM | POA: Diagnosis not present

## 2018-04-27 DIAGNOSIS — R531 Weakness: Secondary | ICD-10-CM | POA: Diagnosis not present

## 2018-04-27 DIAGNOSIS — R066 Hiccough: Secondary | ICD-10-CM | POA: Insufficient documentation

## 2018-04-27 LAB — BASIC METABOLIC PANEL
Anion gap: 17 — ABNORMAL HIGH (ref 5–15)
BUN: 18 mg/dL (ref 8–23)
CO2: 27 mmol/L (ref 22–32)
CREATININE: 1.18 mg/dL (ref 0.61–1.24)
Calcium: 8.5 mg/dL — ABNORMAL LOW (ref 8.9–10.3)
Chloride: 96 mmol/L — ABNORMAL LOW (ref 98–111)
GFR calc Af Amer: >60 mL/min (ref >60)
GFR calc non Af Amer: >60 mL/min (ref >60)
Glucose, Bld: 101 mg/dL — ABNORMAL HIGH (ref 70–99)
Potassium: 4.2 mmol/L (ref 3.5–5.1)
Sodium: 140 mmol/L (ref 135–145)

## 2018-04-27 LAB — I-STAT TROPONIN, ED: Troponin i, poc: 0.01 ng/mL (ref 0.00–0.08)

## 2018-04-27 LAB — CBC WITH DIFFERENTIAL/PLATELET
Abs Immature Granulocytes: 0.01 10*3/uL (ref 0.00–0.07)
BASOS ABS: 0 10*3/uL (ref 0.0–0.1)
Basophils Relative: 0 %
EOS ABS: 0 10*3/uL (ref 0.0–0.5)
EOS PCT: 0 %
HEMATOCRIT: 50.2 % (ref 39.0–52.0)
Hemoglobin: 15.8 g/dL (ref 13.0–17.0)
Immature Granulocytes: 0 %
Lymphocytes Relative: 18 %
Lymphs Abs: 0.9 10*3/uL (ref 0.7–4.0)
MCH: 26.8 pg (ref 26.0–34.0)
MCHC: 31.5 g/dL (ref 30.0–36.0)
MCV: 85.2 fL (ref 80.0–100.0)
Monocytes Absolute: 0.4 10*3/uL (ref 0.1–1.0)
Monocytes Relative: 9 %
NRBC: 0 % (ref 0.0–0.2)
Neutro Abs: 3.7 10*3/uL (ref 1.7–7.7)
Neutrophils Relative %: 73 %
Platelets: 242 10*3/uL (ref 150–400)
RBC: 5.89 MIL/uL — ABNORMAL HIGH (ref 4.22–5.81)
RDW: 15.5 % (ref 11.5–15.5)
WBC: 5.2 10*3/uL (ref 4.0–10.5)

## 2018-04-27 MED ORDER — CEPHALEXIN 500 MG PO CAPS: 500.0000 mg | ORAL_CAPSULE | Freq: Four times a day (QID) | ORAL | 0 refills | Status: DC

## 2018-04-27 MED ORDER — DIAZEPAM 5 MG PO TABS
5.0000 mg | ORAL_TABLET | Freq: Once | ORAL | Status: AC
  Administered 2018-04-27: 5 mg via ORAL
  Filled 2018-04-27: qty 1

## 2018-04-27 MED ORDER — OMEPRAZOLE 20 MG PO CPDR: 20.0000 mg | DELAYED_RELEASE_CAPSULE | Freq: Every day | ORAL | 0 refills | Status: DC

## 2018-04-27 MED ORDER — BACLOFEN 5 MG PO TABS: 1.0000 | ORAL_TABLET | Freq: Three times a day (TID) | ORAL | 0 refills | Status: DC

## 2018-04-27 NOTE — Discharge Instructions (Addendum)
You are seen in the ER today for hiccups.  Your work-up here was overall reassuring.  Your x-ray showed that you have some gas in your stomach, please be sure to try to belch.  We are sending you home with the hard copies of prescriptions you were given at urgent care, please take these as prescribed and have them filled at a local pharmacy.  We have prescribed you new medication(s) today. Discuss the medications prescribed today with your pharmacist as they can have adverse effects and interactions with your other medicines including over the counter and prescribed medications. Seek medical evaluation if you start to experience new or abnormal symptoms after taking one of these medicines, seek care immediately if you start to experience difficulty breathing, feeling of your throat closing, facial swelling, or rash as these could be indications of a more serious allergic reaction  Please follow-up with primary care in the next 2 to 3 days.  Return to the ER for new or worsening symptoms or any other concerns.

## 2018-04-27 NOTE — ED Triage Notes (Signed)
Pt arrived via [facility]; pt with c/o hiccups without relief; pt c/o difficulty breathing when he has hiccups; 97% on RA; 150/92; 80; 18; dx with UTI 2 days ago.

## 2018-04-27 NOTE — ED Provider Notes (Signed)
[facility] EMERGENCY DEPARTMENT Provider Note   CSN: [id number] Arrival date & time: 04/27/18  4010     History   Chief Complaint Chief Complaint  Patient presents with  . Hiccups    HPI Victor [name] is a 63 y.o. male with a hx of thoracic aortic aneurysm s/o aortic root replacement & endovascular stenting, HTN, prior stroke, & prior spinal cord infarction w/ paraplegia who walks with a walker at baseline who presents to the ED with complaint of hiccups x 4 days. Patient states that he is hiccupping fairly constantly, the hiccups will go away for brief periods, but always return. No specific alleviating/aggravating factors. No significant diet changes. He has not tried anything for his sxs. He states that he does feel short of breath with the hiccups when they occur closely together.  He has had some decreased PO intake due to lack of appetite and feels generally weak because of this. Has also had urinary frequency/urgency/dysuria. Denies fever, chills, nausea, vomiting, indigestion, chest pain, dizziness, lightheadedness, or syncope.   He was seen at urgent care for sxs 12/31- diagnosed with UTI- given keflex for this and given Baclofen & Prilosec for his hiccups. Patient has not filled these prescriptions as he states he is unsure what pharmacy they were sent to. He is here today for his persistent sxs.   HPI  Past Medical History:  Diagnosis Date  . History of kidney stones   . Hypertension   . 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 Stroke (HCC)   . Thoracic aortic aneurysm (HCC) 09/2014    Patient Active Problem List   Diagnosis Date Noted  . Unilateral inguinal hernia without obstruction or gangrene 06/29/2017  . Nerve pain 05/18/2017  . Spinal cord infarction (HCC) 03/15/2017  . Paraplegia (HCC) 03/07/2017  . Bilateral leg weakness 03/07/2017  . S/P thoracic aortic aneurysm repair 01/21/2017  . Coronary artery calcification 12/23/2016  . S/P aortic  dissection repair 12/23/2016  . Thoracic aortic aneurysm without rupture (HCC) 12/23/2016  . Preoperative cardiovascular examination 12/23/2016  . Essential hypertension 09/23/2015  . Iron deficiency anemia 01/28/2015  . Weight loss 10/30/2014  . Hospital-acquired pneumonia 10/09/2014  . Ischemic leg 10/09/2014  . Aortic dissection (HCC) 10/07/2014    Past Surgical History:  Procedure Laterality Date  . ASCENDING AORTIC ROOT REPLACEMENT N/A 01/21/2017   Procedure: Replacement of Aortic Arch with circulatory arrest;  Surgeon: [name], MD;  Location: [facility] OR;  Service: Open Heart Surgery;  Laterality: N/A;  . CARDIAC CATHETERIZATION    . FEMORAL-FEMORAL BYPASS GRAFT N/A 10/07/2014   Procedure: LEFT  FEMORAL ARTERY -RIGHT FEMORAL ARTERY BYPASS GRAFT USING X 30 CM HEMASHIELD GOLD GRAFT;  Surgeon: Larina Earthly, MD;  Location: [facility] OR;  Service: Vascular;  Laterality: N/A;  . RIGHT/LEFT HEART CATH AND CORONARY ANGIOGRAPHY N/A 01/11/2017   Procedure: RIGHT/LEFT HEART CATH AND CORONARY ANGIOGRAPHY- Diagnostic Only;  Surgeon: [name], MD;  Location: [facility] INVASIVE CV LAB;  Service: Cardiovascular;  Laterality: N/A;  . s/p thoracic aneurysm repair    . STERNOTOMY N/A 01/21/2017   Procedure: REDO STERNOTOMY;  Surgeon: [name], MD;  Location: [facility] OR;  Service: Open Heart Surgery;  Laterality: N/A;  . TEE WITHOUT CARDIOVERSION N/A 01/21/2017   Procedure: TRANSESOPHAGEAL ECHOCARDIOGRAM (TEE);  Surgeon: [name], MD;  Location: [facility] OR;  Service: Open Heart Surgery;  Laterality: N/A;  . THORACIC AORTIC ANEURYSM REPAIR N/A 10/06/2014   Procedure: THORACIC ASCENDING  ANEURYSM REPAIR (AAA);  Surgeon: Delight OvensEdward B Gerhardt, MD;  [facility];  Service: Open Heart Surgery;  Laterality: N/A;  hyportermia circulatory arrest and resuspension of aortic valve  . THORACIC AORTIC ENDOVASCULAR STENT GRAFT N/A 01/21/2017   Procedure: THORACIC AORTIC ENDOVASCULAR STENT GRAFT;  Surgeon:  Nada LibmanBrabham, Vance W, MD;  [facility];  Service: Vascular;  Laterality: N/A;  . THROMBECTOMY ILIAC ARTERY Left 03/08/2017   Procedure: THROMBECTOMY of Left subclavian artery;  Surgeon: Chuck Hintickson, Christopher S, MD;  [facility];  Service: Vascular;  Laterality: Left;  Marland Kitchen. VASCULAR SURGERY          Home Medications    Prior to Admission medications   Medication Sig Start Date End Date Taking? Authorizing Provider  aspirin 325 MG tablet Take 162.5 mg by mouth daily.    [provider]  Baclofen 5 MG TABS Take 1 tablet by mouth 3 (three) times daily. 04/25/18   Cathie HoopsYu, Amy V, PA-C  cephALEXin (KEFLEX) 500 MG capsule Take 1 capsule (500 mg total) by mouth 2 (two) times daily. 04/25/18   Cathie HoopsYu, Amy V, PA-C  DULoxetine (CYMBALTA) 60 MG capsule Take 1 capsule (60 mg total) by mouth daily. 04/14/18   George HughVanschaick, Jessica, NP  gabapentin (NEURONTIN) 300 MG capsule Take 1 capsule (300 mg total) by mouth 3 (three) times daily. 04/14/18   George HughVanschaick, Jessica, NP  metoprolol tartrate (LOPRESSOR) 25 MG tablet Take 0.5 tablets (12.5 mg total) by mouth 2 (two) times daily. Hold dose if BP<100/60 or pulse<50 your are symptomatic 06/29/17   Ranelle OysterSwartz, Zachary T, MD  omeprazole (PRILOSEC) 20 MG capsule Take 1 capsule (20 mg total) by mouth daily. 04/25/18   Belinda FisherYu, Amy V, PA-C    Family History Family History  Problem Relation Age of Onset  . Heart murmur Mother     Social History Social History   Tobacco Use  . Smoking status: Former Smoker    Years: 35.00    Types: Cigarettes, Cigars    Last attempt to quit: 10/07/2014    Years since quitting: 3.5  . Smokeless tobacco: Never Used  Substance Use Topics  . Alcohol use: No    Alcohol/week: 0.0 standard drinks  . Drug use: No     Allergies   Patient has no active allergies.   Review of Systems Review of Systems  Constitutional: Positive for appetite change. Negative for chills and fever.  HENT: Negative for congestion, ear pain and sore  throat.        Positive for hiccups.   Respiratory: Positive for cough and shortness of breath.   Cardiovascular: Negative for chest pain, palpitations and leg swelling.  Gastrointestinal: Negative for abdominal pain, nausea and vomiting.  Genitourinary: Positive for dysuria, frequency and urgency.  Neurological: Positive for weakness (generalized). Negative for dizziness, syncope and light-headedness.  All other systems reviewed and are negative.    Physical Exam Updated Vital Signs BP 119/84 (BP Location: Right Arm)   Pulse 89   Temp 98.9 F (37.2 C) (Oral)   Resp 20   SpO2 98%   Physical Exam Vitals signs and nursing note reviewed.  Constitutional:      General: He is not in acute distress.    Appearance: He is well-developed. He is not toxic-appearing.     Comments: Patient frequently hiccupping throughout exam.   HENT:     Head: Normocephalic and atraumatic.  Eyes:     General:        Right eye: No discharge.  Left eye: No discharge.     Conjunctiva/sclera: Conjunctivae normal.  Neck:     Musculoskeletal: Neck supple.  Cardiovascular:     Rate and Rhythm: Normal rate and regular rhythm.  Pulmonary:     Effort: Pulmonary effort is normal. No respiratory distress.     Breath sounds: Normal breath sounds. No wheezing, rhonchi or rales.  Abdominal:     General: There is no distension.     Palpations: Abdomen is soft.     Tenderness: There is no abdominal tenderness.  Skin:    General: Skin is warm and dry.     Findings: No rash.  Neurological:     Mental Status: He is alert.     Comments: Clear speech.   Psychiatric:        Behavior: Behavior normal.      ED Treatments / Results  Labs (all labs ordered are listed, but only abnormal results are displayed) Labs Reviewed  BASIC METABOLIC PANEL - Abnormal; Notable for the following components:      Result Value   Chloride 96 (*)    Glucose, Bld 101 (*)    Calcium 8.5 (*)    Anion gap 17 (*)    All  other components within normal limits  CBC WITH DIFFERENTIAL/PLATELET - Abnormal; Notable for the following components:   RBC 5.89 (*)    All other components within normal limits  I-STAT TROPONIN, ED    EKG None  Radiology Dg Abdomen Acute W/chest  Result Date: 04/27/2018 CLINICAL DATA:  Persistent hit cups and burping for the past 3 days with subsequent development of shortness of breath, inability E, generalized weakness. History of abdominal aortic aneurysm. History of thoracic aortic stent graft placement. Previous CVA. EXAM: DG ABDOMEN ACUTE W/ 1V CHEST COMPARISON:  PA and lateral chest x-ray of March 07, 2017 FINDINGS: The lungs are well-expanded. There is no discrete infiltrate. There is stable biapical pleural thickening. There is no pleural effusion. Heart and pulmonary vascularity are normal. A aortic arch and descending thoracic aortic stent graft is present. The sternal wires are intact. Within the abdomen there is moderate gaseous distention of the stomach. The small and large bowel stool and gas pattern is normal. There are degenerative changes of the lower lumbar spine. No abnormal soft tissue calcifications are observed. IMPRESSION: Moderate gaseous distention of the stomach. Normal appearance of the small and large bowel. No free extraluminal gas collections are observed. Mild chronic bronchitic-smoking related changes, stable. No acute pneumonia. No mediastinal or hilar masses are observed. Previous thoracic aortic stent graft placement without radiographic evidence of leakage. Electronically Signed   By: David  [place] M.D.   On: 04/27/2018 09:41    Procedures Procedures (including critical care time)  Medications Ordered in ED Medications - No data to display   Initial Impression / Assessment and Plan / ED Course  I have reviewed the triage vital signs and the nursing notes.  Pertinent labs & imaging results that were available during my care of the patient were  reviewed by me and considered in my medical decision making (see chart for details).   Patient presents to the ED with hiccups. He is nontoxic appearing, in no apparent distress, vitals WNL. He is hiccupping on exam, otherwise benign physical. Evaluate with labs, EKG, & acute chest/abdomen Xray. Valium in attempts to assist with sxs.   Labs with elevated anion gap, not acidotic, likely secondary to mild hypochloremia. Mild hypocalcemia at 8.5. No anemia. No leukocytosis. EKG  without obvious ischemic changes, trop negative after 4 days of sxs, doubt ACS. No hypoxia, tachycardia, or evidence of respiratory distress to raise concern for PE. He is not having pain, suspicion for this having relation to his thoracic aortic aneurysm is low. Xray with normal small/large bowel. No pneumonia. No mediastinal or hilar masses. His stent graft is without evidence of radiographic leakage. He does have moderate gaseous distension which may be contributory to his hiccups. He is feeling somewhat improved follwoing valium. Seems safe for discharge at this time. We will provide hard copies of his previously prescribed medicines from UC visit to try including keflex for UTI- no signs/sxs to raise concern for pyelo or sepsis. PCP follow up. I discussed results, treatment plan, need for follow-up, and return precautions with the patient. Provided opportunity for questions, patient confirmed understanding and is in agreement with plan.   Findings and plan of care discussed with supervising physician Dr. Patria Maneampos who is in agreement.   Final Clinical Impressions(s) / ED Diagnoses   Final diagnoses:  Hiccups    ED Discharge Orders         Ordered    cephALEXin (KEFLEX) 500 MG capsule  4 times daily     04/27/18 1102    omeprazole (PRILOSEC) 20 MG capsule  Daily     04/27/18 1102    Baclofen 5 MG TABS  3 times daily     04/27/18 [address]1102           Tiffini Blacksher, Pleas KochSamantha R, PA-C 04/27/18 1232    Azalia Bilisampos, Kevin,  MD 04/28/18 1512

## 2018-04-29 LAB — URINE CULTURE: Culture: >=100000 — AB

## 2018-04-30 NOTE — Progress Notes (Signed)
I agree with the above plan 

## 2018-05-02 ENCOUNTER — Telehealth (HOSPITAL_COMMUNITY): Payer: Self-pay | Admitting: Emergency Medicine

## 2018-05-02 MED ORDER — SULFAMETHOXAZOLE-TRIMETHOPRIM 800-160 MG PO TABS: 1.0000 | ORAL_TABLET | Freq: Two times a day (BID) | ORAL | 0 refills | Status: AC

## 2018-05-02 NOTE — Telephone Encounter (Signed)
Urine culture was positive for ENTEROBACTER AEROGENES resistant to Keflex  given at urgent care visit. Prescription for bactrim per protocol sent to pharmacy of choice. Attempted to reach patient. No answer at this time. Voicemail left.

## 2018-05-03 ENCOUNTER — Telehealth (HOSPITAL_COMMUNITY): Payer: Self-pay | Admitting: Emergency Medicine

## 2018-05-03 NOTE — Telephone Encounter (Signed)
Attempted to reach patient x2. No answer at this time. Voicemail left.    

## 2018-05-07 ENCOUNTER — Telehealth (HOSPITAL_COMMUNITY): Payer: Self-pay | Admitting: Emergency Medicine

## 2018-05-07 NOTE — Telephone Encounter (Signed)
Attempted to reach patient. No answer at this time. Voicemail left.  Letter sent.   

## 2018-05-09 ENCOUNTER — Ambulatory Visit
Admission: EM | Admit: 2018-05-09 | Discharge: 2018-05-09 | Disposition: A | Discharge status: Home / Self Care | Payer: Medicaid Other | Attending: Physician Assistant | Admitting: Physician Assistant

## 2018-05-09 ENCOUNTER — Encounter: Payer: Self-pay | Admitting: Emergency Medicine

## 2018-05-09 DIAGNOSIS — R05 Cough: Secondary | ICD-10-CM | POA: Insufficient documentation

## 2018-05-09 DIAGNOSIS — N390 Urinary tract infection, site not specified: Secondary | ICD-10-CM | POA: Diagnosis not present

## 2018-05-09 DIAGNOSIS — R058 Other specified cough: Secondary | ICD-10-CM

## 2018-05-09 MED ORDER — BENZONATATE 100 MG PO CAPS: 100.0000 mg | ORAL_CAPSULE | Freq: Three times a day (TID) | ORAL | 0 refills | Status: DC

## 2018-05-09 MED ORDER — CIPROFLOXACIN HCL 500 MG PO TABS: 500.0000 mg | ORAL_TABLET | Freq: Two times a day (BID) | ORAL | 0 refills | Status: DC

## 2018-05-09 NOTE — ED Triage Notes (Signed)
Pt presents to [facility] for assessment of continued frequency, difficulty starting his stream, and dark colored urine since 12/31.

## 2018-05-09 NOTE — Discharge Instructions (Signed)
I have sent in Cipro to your pharmacy.  I am sending in [facility] for your nagging cough.  Please drink lots of water.  You feel like you are symptoms are getting worse I want you to come back or go to the ED.

## 2018-05-09 NOTE — ED Notes (Signed)
Patient able to ambulate independently with walker at baseline  

## 2018-05-09 NOTE — ED Provider Notes (Signed)
05/09/2018 10:44 AM   DOB: 1955/07/04 / MRN: [id number]  SUBJECTIVE:  Victor [name] is a 63 y.o. male presenting for continued dysuria and urgency.  The story begins on 04/25/2018 when the patient presented to the: Urgent care at [facility] and was diagnosed with a UTI among several other complaints.  He was prescribed Keflex at that time.  Urine culture was run which showed Enterobacter aerogene with resistance to cefazolin.  Per chart review the urgent care has been calling numerous times to tried to reach the patient to alert him of the resistance pattern and to start him on Septra.  Patient tells me today that his phone has not been working properly.  He denies fever, chills, nausea, headache, dizziness.  He does associate worsening hesitancy and weak stream.  He complains of raspy but improving cough over the last 3 weeks.  Tells me "I have a cold that just will not let go."  Denies shortness of breath, chest pain.  He has no active allergies.   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    He  reports that he quit smoking about 3 years ago. His smoking use included cigarettes and cigars. He quit after 35.00 years of use. He has never used smokeless tobacco. He reports that he does not drink alcohol or use drugs. He  has no history on file for sexual activity. The patient  has a past surgical history that includes Thoracic aortic aneurysm repair (N/A, 10/06/2014); Femoral-femoral Bypass Graft (N/A, 10/07/2014); RIGHT/LEFT HEART CATH AND CORONARY ANGIOGRAPHY (N/A, 01/11/2017); Cardiac catheterization; Ascending aortic root replacement (N/A, 01/21/2017); TEE without cardioversion (N/A, 01/21/2017); Sternotomy (N/A, 01/21/2017); Thoracic aortic endovascular stent graft (N/A, 01/21/2017); Vascular surgery; s/p thoracic aneurysm repair; and Thrombectomy iliac artery (Left,  03/08/2017).  His family history includes Heart murmur in his mother.  Review of Systems  Constitutional: Negative for chills, diaphoresis and fever.  Gastrointestinal: Negative for abdominal pain, blood in stool, constipation, diarrhea, heartburn, melena, nausea and vomiting.  Genitourinary: Negative for flank pain.  Skin: Negative for rash.  Neurological: Negative for dizziness.    OBJECTIVE:  BP 109/74 (BP Location: Left Arm)   Pulse 93   Temp 98 F (36.7 C) (Oral)   Resp 20   SpO2 96%   Wt Readings from Last 3 Encounters:  04/14/18 156 lb 3.2 oz (70.9 kg)  03/14/18 160 lb 6.4 oz (72.8 kg)  02/09/18 155 lb (70.3 kg)   Temp Readings from Last 3 Encounters:  05/09/18 98 F (36.7 C) (Oral)  04/27/18 98.9 F (37.2 C) (Oral)  04/25/18 99.7 F (37.6 C)   BP Readings from Last 3 Encounters:  05/09/18 109/74  04/27/18 119/84  04/25/18 105/76   Pulse Readings from Last 3 Encounters:  05/09/18 93  04/27/18 89  04/25/18 (!) 114    Physical Exam Vitals signs and nursing note reviewed.  Constitutional:      Appearance: He is well-developed. He is not ill-appearing or diaphoretic.  Eyes:     Conjunctiva/sclera: Conjunctivae normal.     Pupils: Pupils are equal, round, and reactive to light.  Cardiovascular:     Rate and Rhythm: Normal rate.  Pulmonary:     Effort: Pulmonary effort is normal.  Abdominal:     General: Bowel sounds are normal. There is no distension.     Palpations: Abdomen is soft. There is no mass.  Tenderness: There is no abdominal tenderness. There is no right CVA tenderness, left CVA tenderness, guarding or rebound.     Hernia: No hernia is present.  Musculoskeletal: Normal range of motion.  Skin:    General: Skin is warm and dry.  Neurological:     Mental Status: He is alert and oriented to person, place, and time.     Cranial Nerves: No cranial nerve deficit.     Coordination: Coordination normal.    Lab Results  Component Value Date    CREATININE 1.18 04/27/2018   Wt Readings from Last 3 Encounters:  04/14/18 156 lb 3.2 oz (70.9 kg)  03/14/18 160 lb 6.4 oz (72.8 kg)  02/09/18 155 lb (70.3 kg)     No results found for: PSA  No results found for this or any previous visit (from the past 72 hour(s)).  No results found.  ASSESSMENT AND PLAN:   Urinary tract infection without hematuria, site unspecified: Abdomen nontender.  Negative for flank pain.  Vitals stable.  I am concerned about the possibility of prostatitis.  I am sending 10 days of Cipro and asking him to come back if his symptoms return at any point in the next 30 days so we can extend his therapy to treat a chronic prostatitis.  Post-viral cough syndrome: Tessalon.    Discharge Instructions     I have sent in Cipro to your pharmacy.  I am sending in [facility] for your nagging cough.  Please drink lots of water.  You feel like you are symptoms are getting worse I want you to come back or go to the ED.        The patient is advised to call or return to clinic if he does not see an improvement in symptoms, or to seek the care of the closest emergency department if he worsens with the above plan.   Deliah Boston, MHS, PA-C 05/09/2018 10:44 AM   Ofilia Neas, PA-C 05/09/18 1046

## 2018-05-09 NOTE — ED Notes (Addendum)
Patient attempted for urine sample without success.  Provided with water.

## 2018-08-31 ENCOUNTER — Other Ambulatory Visit: Payer: Self-pay | Admitting: Physical Medicine & Rehabilitation

## 2018-08-31 DIAGNOSIS — I10 Essential (primary) hypertension: Secondary | ICD-10-CM

## 2018-09-05 ENCOUNTER — Telehealth: Payer: Self-pay

## 2018-09-05 NOTE — Telephone Encounter (Signed)
Left vm for patient that visit will be video due to COVId 19. I stated we need verbal consent to do video and to file insurance. Also need to know if he has a cell phone with a camera on it. Need to know if he has a email address he can access from his phone.

## 2018-09-06 ENCOUNTER — Other Ambulatory Visit: Payer: Self-pay

## 2018-09-06 ENCOUNTER — Encounter: Payer: Self-pay | Admitting: Adult Health

## 2018-09-06 ENCOUNTER — Telehealth: Payer: Self-pay | Admitting: Adult Health

## 2018-09-06 ENCOUNTER — Ambulatory Visit (INDEPENDENT_AMBULATORY_CARE_PROVIDER_SITE_OTHER): Payer: Medicaid Other | Admitting: Adult Health

## 2018-09-06 VITALS — BP 139/82 | HR 94 | Temp 97.9°F | Wt 161.0 lb

## 2018-09-06 DIAGNOSIS — Z7689 Persons encountering health services in other specified circumstances: Secondary | ICD-10-CM

## 2018-09-06 DIAGNOSIS — G9511 Acute infarction of spinal cord (embolic) (nonembolic): Secondary | ICD-10-CM | POA: Diagnosis not present

## 2018-09-06 DIAGNOSIS — I10 Essential (primary) hypertension: Secondary | ICD-10-CM | POA: Diagnosis not present

## 2018-09-06 DIAGNOSIS — R29898 Other symptoms and signs involving the musculoskeletal system: Secondary | ICD-10-CM

## 2018-09-06 MED ORDER — ASPIRIN 325 MG PO TABS: 162.5000 mg | ORAL_TABLET | Freq: Every day | ORAL | 3 refills | Status: AC

## 2018-09-06 MED ORDER — METOPROLOL TARTRATE 25 MG PO TABS: 12.5000 mg | ORAL_TABLET | Freq: Two times a day (BID) | ORAL | 3 refills | Status: DC

## 2018-09-06 MED ORDER — GABAPENTIN 300 MG PO CAPS: 300.0000 mg | ORAL_CAPSULE | Freq: Three times a day (TID) | ORAL | 3 refills | Status: DC

## 2018-09-06 NOTE — Patient Instructions (Addendum)
Restart aspirin  for secondary stroke prevention  Continue gabapentin 300 mg 3 times daily for nerve pain  Referral to internal medicine placed to establish care with a primary care provider -refill for metoprolol placed but ongoing refills will need to be made by your PCP  Referral placed to OT for evaluation and fitting of foot drop brace  Schedule follow-up visit with Dr. Riley Kill  Continue to do home exercises and staying active  Continue to monitor blood pressure at home  Maintain strict control of hypertension with blood pressure goal below 130/90, diabetes with hemoglobin A1c goal below 6.5% and cholesterol with LDL cholesterol (bad cholesterol) goal below 70 mg/dL. I also advised the patient to eat a healthy diet with plenty of whole grains, cereals, fruits and vegetables, exercise regularly and maintain ideal body weight.  Followup in the future with me in 6 months or call earlier if needed       Thank you for coming to see [place] at [facility]. I hope we have been able to provide you high quality care today.  You may receive a patient satisfaction survey over the next few weeks. We would appreciate your feedback and comments so that we may continue to improve ourselves and the health of our patients.

## 2018-09-06 NOTE — Progress Notes (Signed)
[facility] [address] [place]. [place] [address] [phone]       OFFICE FOLLOW-UP NOTE  Mr. LEORY BERI Date of Birth:  Jul 26, 1955 Medical Record Number:  [id number]       HPI:  09/06/18 VISIT Mr. Niemela continues to be followed in this office regarding T10 spinal cord infarction likely related to recent aortic arch surgery.  Continues to have residual deficits of lower extremity weakness and paresthesias.  At prior visit 04/14/2018, he continued to experience bilateral lower extremity paresthesias but noncompliance and recommended medications such as gabapentin and recently started Cymbalta.  Long discussion regarding use of Cymbalta and gabapentin and importance of compliance for hopeful pain management.  At today's visit, he discontinued use of Cymbalta but continued on gabapentin 300 mg 1-3 times daily at that time as he would forget to take throughout the day.  He does endorse when he uses 3 times daily, he does gain benefit from this.  He is currently ambulating with a rolling walker but does have residual foot drop L>R with occasional dragging of left leg.  He is also requesting refill for metoprolol.  Continues on aspirin 325 mg without side effects of bleeding or bruising.  No further concerns at this time.      Interval history:  Mr Dibernardo is seen today for first office follow-up visit for hospital admission for paraplegia in November 2018. He is accompanied by his nephew and wife and history is obtained from them as well as review of electronic medical records. I have personally reviewed imaging films. ARLANDER MAGNON is an 63 y.o. male who presented from home to [facility] on 03/07/2017 with acute lower extremity paralysis. He is a poor and inconsistent historian, requiring questions to be repeated multiple times for clarification many times during the interview. Best information obtained is as follows: He was in [facility] until about 3 weeks ago  when he started to feel weak in the legs. He works as a Paediatric nurse and this would be noticed at times during the day, especially when attempting to stand up from a seated position. He denies having had any arm weakness, vision changes, confusion, trouble talking or sensory numbness during the 3 week time period. He does report having occasional SOB. This weekend, symptoms worsened on Saturday with trouble standing, so he decided to spend most of the day in bed watching TV. He had similar symptoms on Sunday, so he spent the day watching football on TV while lying in bed. He gives inconsistent reports regarding Sunday, at one point stating he spent the whole day in bed, and at another point stating that he could get up and walk. He states he lay in bed until about 1-2 AM on Monday morning (today), when he then tried to get up but legs gave out from under him. He fell to the floor per one account and slid downwards by another account. He could not get up after he slid/fell to the floor. He called out to his parents, who he lives with, and they called EMS. He denied bowel or bladder incontinence, but states that for the past 3-4 weeks he has had trouble fully emptying his bladder. 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 His clinical exam showed a T10 level sensory level but flaccid paraparesis  and MRI scan showed spinal cord hyperintensity from T9 down which was felt compatible with spinal cord infarct. A lumbar epidural drain was placed and he was also started on steroids. 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 Patient showed slight improvement and was able to move lower extremities  slightly. No hospitalization. He was seen by physical occupational therapy and transferred for rehabilitation. He has been getting home physical and occupational therapy and plans to start outpatient therapy soon. He has been followed at rehabilitation clinic by Dr. Caren HazySwarts. He has shown some improvement and now is able to walk with a walker at home. He complains of some aching in burning pain in his legs and some constipation. He is able to empty his bladder quite well he has only occasional incontinence.  09/07/17:He   is currently using a rolling walker for ambulation but does still continue to have bilateral lower extremity weakness.  Also has complaints of nerve pain.  He has a order for Neurontin 100 mg 3 times daily but states he is only been taking this twice a day.  Reports that this does help him during the day but at night he has burning pain in bilateral lower extremities where it will wake him up in the middle the night.  Patient does endorse dizziness at time after sitting for prolonged period of time and then going to stand up.    03/14/2018: Patient is being seen today for six-month follow-up visit.  He does continue to experience neuropathy pain in bilateral lower extremities and has since stopped taking gabapentin as it did not help.  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  He does admit to not not doing exercises at recommended during therapy sessions and does state he will start to do those again.  He is currently using rolling walker due to continued lower extremity weakness but has made great improvements.  He has started driving again and has not had any complications doing so.  He continues to take aspirin 162.5 mg daily without side effects of bleeding or bruising.  Blood pressure today satisfactory 142/75.  No further concerns at this time.  Denies new or worsening stroke/TIA symptoms.  04/14/18: Patient is being seen today  due to complaints of dizziness and no benefit of neuropathy pain with starting Cymbalta.  He does complain of dizziness sensation which he states has been occurring since hospital discharge.  He denies any worsening of this but when he informed office staff of his dizziness prompting this appointment, he was requesting medication to help with the dizziness.  He does endorse this happening intermittently and typically worsens upon position changing.  He states he did trial Cymbalta but did not notice any benefit therefore he stopped.  Question how long he was taking this medication and states he stopped taking a month ago but advised patient that it was only started a month ago.  Once this was brought to patient's attention, he was unable to tell me exactly how long he continued this medication before prior to stopping.  Advised patient that with neuropathy pain especially in his case, it can take a little while for these medications to take effect and is difficult to determine how much benefit he will gain with the medications.  He is agreeable to restarting his medications and to trial them for a  longer period of time.  No further concerns at this time.      ROS:   14 system review of systems is positive for weakness, numbness, tingling, pain and the following systems are negative  PMH:  Past Medical History:  Diagnosis Date   History of kidney stones    Hypertension    Spinal cord infarction (HCC) 02/2017   Stroke [facility])    Thoracic aortic aneurysm (HCC) 09/2014    Social History:  Social History   Socioeconomic History   Marital status: Single    Spouse name: Not on file   Number of children: Not on file   Years of education: Not on file   Highest education level: Not on file  Occupational History   Not on file  Social Needs   Financial resource strain: Not on file   Food insecurity:    Worry: Not on file    Inability: Not on file   Transportation needs:    Medical:  Not on file    Non-medical: Not on file  Tobacco Use   Smoking status: Former Smoker    Years: 35.00    Types: Cigarettes, Cigars    Last attempt to quit: 10/07/2014    Years since quitting: 3.9   Smokeless tobacco: Never Used  Substance and Sexual Activity   Alcohol use: No    Alcohol/week: 0.0 standard drinks   Drug use: No   Sexual activity: Not on file  Lifestyle   Physical activity:    Days per week: Not on file    Minutes per session: Not on file   Stress: Not on file  Relationships   Social connections:    Talks on phone: Not on file    Gets together: Not on file    Attends religious service: Not on file    Active member of club or organization: Not on file    Attends meetings of clubs or organizations: Not on file    Relationship status: Not on file   Intimate partner violence:    Fear of current or ex partner: Not on file    Emotionally abused: Not on file    Physically abused: Not on file    Forced sexual activity: Not on file  Other Topics Concern   Not on file  Social History Narrative   Not on file    Medications:   Current Outpatient Medications on File Prior to Visit  Medication Sig Dispense Refill   Baclofen 5 MG TABS Take 1 tablet by mouth 3 (three) times daily. 15 tablet 0   benzonatate (TESSALON) 100 MG capsule Take 1-2 capsules (100-200 mg total) by mouth 3 (three) times daily. 30 capsule 0   cephALEXin (KEFLEX) 500 MG capsule Take 1 capsule (500 mg total) by mouth 4 (four) times daily. 28 capsule 0   omeprazole (PRILOSEC) 20 MG capsule Take 1 capsule (20 mg total) by mouth daily. 10 capsule 0   No current facility-administered medications on file prior to visit.     Allergies:   No Active Allergies    Vitals:   09/06/18 1426  BP: 139/82  Pulse: 94  Temp: 97.9 F (36.6 C)    Physical Exam General: Pleasant middle-aged African American male, seated, in no evident distress Head: head normocephalic and atraumatic.  Neck:  supple with no carotid or supraclavicular bruits Cardiovascular: regular rate and rhythm, no murmurs Musculoskeletal: no deformity Skin:  no rash/petichiae Vascular:  Normal pulses all extremities  Neurologic Exam  Mental Status: Awake and fully alert. Oriented to place and time. Recent and remote memory intact. Attention span, concentration and fund of knowledge appropriate. Mood and affect appropriate.  Cranial Nerves: Pupils equal, briskly reactive to light. Extraocular movements full without nystagmus. Visual fields full to confrontation. Hearing intact. Facial sensation intact. Face, tongue, palate moves normally and symmetrically.  Motor: LLE: 4-/5; RLE: 4/5.  Weakness greater in bilateral lower extremity hip flexors and ankle dorsiflexion.  Full strength bilateral upper extremities. Sensory.: intact to touch ,pinprick .position and vibratory sensation.  Coordination: Rapid alternating movements normal in upper extremities. Finger-to-nose performed accurately bilaterally.  Unable to perform heel-to-shin performed accurately bilaterally. Gait and Station: Patient ambulating using rolling walker with slow cautious steps Reflexes: Deep tendon reflexes are brisk throughout.  Toes downgoing.       ASSESSMENT: 63 year-old African-American male with bilateral lower extremity weakness and paresthesias secondary to spinal cord infarct in November 2018 following aortic dissection surgery in September 2018.  Residual deficits of lower extremity weakness and paresthesias   PLAN: -Continue aspirin 162.5mg  for secondary stroke prevention -short-term refill placed for metoprolol as he is unsure of PCP.  Referral placed to internal medicine for establishment of PCP -last refill placed by Dr. Riley Kill who is listed as his PCP but unsure if this is accurate. -Recommended continuation of gabapentin 300 mg 3 times daily -advised to ensure he takes this 3 times daily for optimal benefit -Referral placed to  OT for fitting of AFO due to foot drop interfering with ambulation -Advised to monitor blood pressure at home -Maintain strict control of hypertension with blood pressure goal below 130/90, diabetes with hemoglobin A1c goal below 6.5% and cholesterol with LDL cholesterol (bad cholesterol) goal below 70 mg/dL. I also advised the patient to eat a healthy diet with plenty of whole grains, cereals, fruits and vegetables, exercise regularly and maintain ideal body weight.  Follow up in 6 months or call earlier if needed  Greater than 50% of time during this 25 minute visit was spent on counseling,explanation of diagnosis of spinal cord infarct and paraplegia, ongoing paresthesias and importance of medication compliance, planning of further management, discussion with patient and coordination of care  George Hugh, [facility]  [facility] [address] [place], [place] [address]  Phone [phone] Fax [phone]

## 2018-09-06 NOTE — Telephone Encounter (Signed)
IF pt calls back I need if his cell phone has a camera. Also need consent and whats his cell phone carrier. His visit is today at 215pm,. He will be text a link today for visit if he providers the following information.  Left message with pts mom that pt was left vm 09/05/2018 about visit today at 215pm. I stated we are not doing in office visits due to COVID 19. I stated verbal consent is needed for video visit and to file insurance. I stated we can text pt the link to his cell phone.

## 2018-09-06 NOTE — Telephone Encounter (Signed)
FYI pt called back and is not able to do a Virtual Visit. Pt has Boost and is not able to do the VV on his phone and does not have a laptop to use. Butch Penny, NP approved the In Office.

## 2018-09-11 ENCOUNTER — Encounter: Payer: Self-pay | Admitting: Adult Health

## 2018-09-11 NOTE — Progress Notes (Signed)
I agree with the above plan 

## 2018-10-05 NOTE — Addendum Note (Signed)
Addended by: Venancio Poisson on: 10/05/2018 02:50 PM   Modules accepted: Orders

## 2018-11-06 ENCOUNTER — Other Ambulatory Visit: Payer: Self-pay | Admitting: *Deleted

## 2018-11-06 NOTE — Telephone Encounter (Signed)
Received request for refills on metoprolol.  Per 08/2018 note JV/NP stated to get pcp for future refills.  LM with mother of pt to have pt call back with who is pcp.

## 2019-02-08 ENCOUNTER — Other Ambulatory Visit: Payer: Self-pay

## 2019-02-08 DIAGNOSIS — I10 Essential (primary) hypertension: Secondary | ICD-10-CM

## 2019-02-08 MED ORDER — METOPROLOL TARTRATE 25 MG PO TABS: 12.5000 mg | ORAL_TABLET | Freq: Two times a day (BID) | ORAL | 0 refills | Status: DC

## 2019-03-08 ENCOUNTER — Telehealth: Payer: Self-pay | Admitting: Adult Health

## 2019-03-08 NOTE — Telephone Encounter (Signed)
Thank you for letting me know.  Are we able to send family a card?

## 2019-03-08 NOTE — Telephone Encounter (Signed)
FYI I called patient regarding confirming 11/16 appointment. I spoke with patient's mother who advised that the patient passed away in 2023-03-07.

## 2019-03-08 NOTE — Telephone Encounter (Signed)
Noted  

## 2019-03-12 ENCOUNTER — Ambulatory Visit: Payer: Medicaid Other | Admitting: Adult Health

## 2019-06-27 ENCOUNTER — Telehealth: Payer: Self-pay | Admitting: Adult Health

## 2019-06-27 DIAGNOSIS — I10 Essential (primary) hypertension: Secondary | ICD-10-CM

## 2019-06-27 NOTE — Telephone Encounter (Signed)
Patient requesting a refill of metoprolol. He came into the office today to make a follow up sch end of March.  Best call back is [phone]

## 2019-06-28 ENCOUNTER — Other Ambulatory Visit: Payer: Self-pay | Admitting: Adult Health

## 2019-06-28 DIAGNOSIS — I10 Essential (primary) hypertension: Secondary | ICD-10-CM

## 2019-06-28 MED ORDER — METOPROLOL TARTRATE 25 MG PO TABS: 12.5000 mg | ORAL_TABLET | Freq: Two times a day (BID) | ORAL | 1 refills | Status: DC

## 2019-06-28 NOTE — Telephone Encounter (Signed)
I called pt and he needs Bp med.  He has not been taking.  He does not have pcp or know who it is.  I gave him [facility] [phone] to see about getting new pcp.  He stated that Dr. Hermelinda Medicus gave him name of someone to call, he did not have this.  His pharmacy is the same.

## 2019-06-28 NOTE — Telephone Encounter (Signed)
He needs to obtain a PCP for ongoing refills.  May refill x2 months and should have a PCP by that time

## 2019-07-16 ENCOUNTER — Ambulatory Visit: Payer: Medicaid Other | Admitting: Adult Health

## 2019-07-16 ENCOUNTER — Ambulatory Visit (INDEPENDENT_AMBULATORY_CARE_PROVIDER_SITE_OTHER): Payer: Medicaid Other | Admitting: Adult Health

## 2019-07-16 ENCOUNTER — Encounter: Payer: Self-pay | Admitting: Adult Health

## 2019-07-16 VITALS — BP 106/78 | HR 100 | Temp 97.0°F | Ht 67.0 in | Wt 151.8 lb

## 2019-07-16 DIAGNOSIS — R29898 Other symptoms and signs involving the musculoskeletal system: Secondary | ICD-10-CM

## 2019-07-16 DIAGNOSIS — I10 Essential (primary) hypertension: Secondary | ICD-10-CM

## 2019-07-16 DIAGNOSIS — Z7689 Persons encountering health services in other specified circumstances: Secondary | ICD-10-CM

## 2019-07-16 DIAGNOSIS — H8110 Benign paroxysmal vertigo, unspecified ear: Secondary | ICD-10-CM | POA: Diagnosis not present

## 2019-07-16 DIAGNOSIS — G9511 Acute infarction of spinal cord (embolic) (nonembolic): Secondary | ICD-10-CM | POA: Diagnosis not present

## 2019-07-16 MED ORDER — GABAPENTIN 300 MG PO CAPS: 300.0000 mg | ORAL_CAPSULE | Freq: Three times a day (TID) | ORAL | 6 refills | Status: DC

## 2019-07-16 NOTE — Patient Instructions (Addendum)
Continue aspirin 325 mg daily for secondary stroke prevention  Referral placed to physical therapy for possible BPPV  Referral placed to community health and wellness to establish care with PCP  Restart gabapentin 300mg  three times daily - refill provided  Continue to monitor blood pressure at home  Maintain strict control of hypertension with blood pressure goal below 130/90, diabetes with hemoglobin A1c goal below 6.5% and cholesterol with LDL cholesterol (bad cholesterol) goal below 70 mg/dL. I also advised the patient to eat a healthy diet with plenty of whole grains, cereals, fruits and vegetables, exercise regularly and maintain ideal body weight.  Followup in the future with me in 6 months or call earlier if needed       Thank you for coming to see at [facility]. I hope we have been able to provide you high quality care today.  You may receive a patient satisfaction survey over the next few weeks. We would appreciate your feedback and comments so that we may continue to improve ourselves and the health of our patients.     Benign Positional Vertigo Vertigo is the feeling that you or your surroundings are moving when they are not. Benign positional vertigo is the most common form of vertigo. This is usually a harmless condition (benign). This condition is positional. This means that symptoms are triggered by certain movements and positions. This condition can be dangerous if it occurs while you are doing something that could cause harm to you or others. This includes activities such as driving or operating machinery. What are the causes? In many cases, the cause of this condition is not known. It may be caused by a disturbance in an area of the inner ear that helps your brain to sense movement and balance. This disturbance can be caused by:  Viral infection (labyrinthitis).  Head injury.  Repetitive motion, such as jumping, dancing, or running. What  increases the risk? You are more likely to develop this condition if:  You are a woman.  You are 53 years of age or older. What are the signs or symptoms? Symptoms of this condition usually happen when you move your head or your eyes in different directions. Symptoms may start suddenly, and usually last for less than a minute. They include:  Loss of balance and falling.  Feeling like you are spinning or moving.  Feeling like your surroundings are spinning or moving.  Nausea and vomiting.  Blurred vision.  Dizziness.  Involuntary eye movement (nystagmus). Symptoms can be mild and cause only minor problems, or they can be severe and interfere with daily life. Episodes of benign positional vertigo may return (recur) over time. Symptoms may improve over time. How is this diagnosed? This condition may be diagnosed based on:  Your medical history.  Physical exam of the head, neck, and ears.  Tests, such as: ? MRI. ? CT scan. ? Eye movement tests. Your health care provider may ask you to change positions quickly while he or she watches you for symptoms of benign positional vertigo, such as nystagmus. Eye movement may be tested with a variety of exams that are designed to evaluate or stimulate vertigo. ? An electroencephalogram (EEG). This records electrical activity in your brain. ? Hearing tests. You may be referred to a health care provider who specializes in ear, nose, and throat (ENT) problems (otolaryngologist) or a provider who specializes in disorders of the nervous system (neurologist). How is this treated?  This condition may be treated  in a session in which your health care provider moves your head in specific positions to adjust your inner ear back to normal. Treatment for this condition may take several sessions. Surgery may be needed in severe cases, but this is rare. In some cases, benign positional vertigo may resolve on its own in 2-4 weeks. Follow these  instructions at home: Safety  Move slowly. Avoid sudden body or head movements or certain positions, as told by your health care provider.  Avoid driving until your health care provider says it is safe for you to do so.  Avoid operating heavy machinery until your health care provider says it is safe for you to do so.  Avoid doing any tasks that would be dangerous to you or others if vertigo occurs.  If you have trouble walking or keeping your balance, try using a cane for stability. If you feel dizzy or unstable, sit down right away.  Return to your normal activities as told by your health care provider. Ask your health care provider what activities are safe for you. General instructions  Take over-the-counter and prescription medicines only as told by your health care provider.  Drink enough fluid to keep your urine pale yellow.  Keep all follow-up visits as told by your health care provider. This is important. Contact a health care provider if:  You have a fever.  Your condition gets worse or you develop new symptoms.  Your family or friends notice any behavioral changes.  You have nausea or vomiting that gets worse.  You have numbness or a "pins and needles" sensation. Get help right away if you:  Have difficulty speaking or moving.  Are always dizzy.  Faint.  Develop severe headaches.  Have weakness in your legs or arms.  Have changes in your hearing or vision.  Develop a stiff neck.  Develop sensitivity to light. Summary  Vertigo is the feeling that you or your surroundings are moving when they are not. Benign positional vertigo is the most common form of vertigo.  The cause of this condition is not known. It may be caused by a disturbance in an area of the inner ear that helps your brain to sense movement and balance.  Symptoms include loss of balance and falling, feeling that you or your surroundings are moving, nausea and vomiting, and blurred  vision.  This condition can be diagnosed based on symptoms, physical exam, and other tests, such as MRI, CT scan, eye movement tests, and hearing tests.  Follow safety instructions as told by your health care provider. You will also be told when to contact your health care provider in case of problems. This information is not intended to replace advice given to you by your health care provider. Make sure you discuss any questions you have with your health care provider. Document Revised: 09/21/2017 Document Reviewed: 09/21/2017 Elsevier Patient Education  [place].

## 2019-07-16 NOTE — Progress Notes (Deleted)
[facility] [address] [place]. [place] [address] [phone]       OFFICE FOLLOW-UP NOTE  [name] Date of Birth:  25-Dec-1955 Medical Record Number:  [id number]       HPI:  09/06/18 VISIT Mr. 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  Continues to have residual deficits of lower extremity weakness and paresthesias.  At prior visit 04/14/2018, he continued to experience bilateral lower extremity paresthesias but noncompliance and recommended medications such as gabapentin and recently started Cymbalta.  Long discussion regarding use of Cymbalta and gabapentin and importance of compliance for hopeful pain management.  At today's visit, he discontinued use of Cymbalta but continued on gabapentin 300 mg 1-3 times daily at that time as he would forget to take throughout the day.  He does endorse when he uses 3 times daily, he does gain benefit from this.  He is currently ambulating with a rolling walker but does have residual foot drop L>R with occasional dragging of left leg.  He is also requesting refill for metoprolol.  Continues on aspirin 325 mg without side effects of bleeding or bruising.  No further concerns at this time.      Interval history:  Mr Murri is seen today for first office follow-up visit for hospital admission for paraplegia in November 2018. He is accompanied by his nephew and wife and history is obtained from them as well as review of electronic medical records. I have personally reviewed imaging films. JARRIEL PAPILLION is an 64 y.o. male who presented from home to [facility] on 03/07/2017 with acute lower extremity paralysis. He is a poor and inconsistent historian, requiring questions to be repeated multiple times for clarification many times during the interview. Best information obtained is as follows: He was in [facility] until about 3 weeks ago  when he started to feel weak in the legs. He works as a Paediatric nurse and this would be noticed at times during the day, especially when attempting to stand up from a seated position. He denies having had any arm weakness, vision changes, confusion, trouble talking or sensory numbness during the 3 week time period. He does report having occasional SOB. This weekend, symptoms worsened on Saturday with trouble standing, so he decided to spend most of the day in bed watching TV. He had similar symptoms on Sunday, so he spent the day watching football on TV while lying in bed. He gives inconsistent reports regarding Sunday, at one point stating he spent the whole day in bed, and at another point stating that he could get up and walk. He states he lay in bed until about 1-2 AM on Monday morning (today), when he then tried to get up but legs gave out from under him. He fell to the floor per one account and slid downwards by another account. He could not get up after he slid/fell to the floor. He called out to his parents, who he lives with, and they called EMS. He denied bowel or bladder incontinence, but states that for the past 3-4 weeks he has had trouble fully emptying his bladder. 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 His clinical exam showed a T10 level sensory level but flaccid paraparesis  and MRI scan showed spinal cord hyperintensity from T9 down which was felt compatible with spinal cord infarct. A lumbar epidural drain was placed and he was also started on steroids. 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 Patient showed slight improvement and was able to move lower extremities  slightly. No hospitalization. He was seen by physical occupational therapy and transferred for rehabilitation. He has been getting home physical and occupational therapy and plans to start outpatient therapy soon. He has been followed at rehabilitation clinic by Dr. Caren Hazy. He has shown some improvement and now is able to walk with a walker at home. He complains of some aching in burning pain in his legs and some constipation. He is able to empty his bladder quite well he has only occasional incontinence.  09/07/17:He   is currently using a rolling walker for ambulation but does still continue to have bilateral lower extremity weakness.  Also has complaints of nerve pain.  He has a order for Neurontin 100 mg 3 times daily but states he is only been taking this twice a day.  Reports that this does help him during the day but at night he has burning pain in bilateral lower extremities where it will wake him up in the middle the night.  Patient does endorse dizziness at time after sitting for prolonged period of time and then going to stand up.    03/14/2018: Patient is being seen today for six-month follow-up visit.  He does continue to experience neuropathy pain in bilateral lower extremities and has since stopped taking gabapentin as it did not help.  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  He does admit to not not doing exercises at recommended during therapy sessions and does state he will start to do those again.  He is currently using rolling walker due to continued lower extremity weakness but has made great improvements.  He has started driving again and has not had any complications doing so.  He continues to take aspirin 162.5 mg daily without side effects of bleeding or bruising.  Blood pressure today satisfactory 142/75.  No further concerns at this time.  Denies new or worsening stroke/TIA symptoms.  04/14/18: Patient is being seen today  due to complaints of dizziness and no benefit of neuropathy pain with starting Cymbalta.  He does complain of dizziness sensation which he states has been occurring since hospital discharge.  He denies any worsening of this but when he informed office staff of his dizziness prompting this appointment, he was requesting medication to help with the dizziness.  He does endorse this happening intermittently and typically worsens upon position changing.  He states he did trial Cymbalta but did not notice any benefit therefore he stopped.  Question how long he was taking this medication and states he stopped taking a month ago but advised patient that it was only started a month ago.  Once this was brought to patient's attention, he was unable to tell me exactly how long he continued this medication before prior to stopping.  Advised patient that with neuropathy pain especially in his case, it can take a little while for these medications to take effect and is difficult to determine how much benefit he will gain with the medications.  He is agreeable to restarting his medications and to trial them for a  longer period of time.  No further concerns at this time.      ROS:   14 system review of systems is positive for weakness, numbness, tingling, pain and the following systems are negative  PMH:  Past Medical History:  Diagnosis Date  . History of kidney stones   . Hypertension   . 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 Stroke (HCC)   . Thoracic aortic aneurysm (HCC) 09/2014    Social History:  Social History   Socioeconomic History  . Marital status: Single    Spouse name: Not on file  . Number of children: Not on file  . Years of education: Not on file  . Highest education level: Not on file  Occupational History  . Not on file  Tobacco Use  . Smoking status: Former Smoker    Years: 35.00    Types: Cigarettes, Cigars    Quit date: 10/07/2014    Years since quitting: 4.7  . Smokeless  tobacco: Never Used  Substance and Sexual Activity  . Alcohol use: No    Alcohol/week: 0.0 standard drinks  . Drug use: No  . Sexual activity: Not on file  Other Topics Concern  . Not on file  Social History Narrative  . Not on file   Social Determinants of Health   Financial Resource Strain:   . Difficulty of Paying Living Expenses:   Food Insecurity:   . Worried About Programme researcher, broadcasting/film/video in the Last Year:   . Barista in the Last Year:   Transportation Needs:   . Freight forwarder (Medical):   Marland Kitchen Lack of Transportation (Non-Medical):   Physical Activity:   . Days of Exercise per Week:   . Minutes of Exercise per Session:   Stress:   . Feeling of Stress :   Social Connections:   . Frequency of Communication with Friends and Family:   . Frequency of Social Gatherings with Friends and Family:   . Attends Religious Services:   . Active Member of Clubs or Organizations:   . Attends Banker Meetings:   Marland Kitchen Marital Status:   Intimate Partner Violence:   . Fear of Current or Ex-Partner:   . Emotionally Abused:   Marland Kitchen Physically Abused:   . Sexually Abused:     Medications:   Current Outpatient Medications on File Prior to Visit  Medication Sig Dispense Refill  . aspirin 325 MG tablet Take 0.5 tablets (162.5 mg total) by mouth daily. 15 tablet 3  . Baclofen 5 MG TABS Take 1 tablet by mouth 3 (three) times daily. 15 tablet 0  . benzonatate (TESSALON) 100 MG capsule Take 1-2 capsules (100-200 mg total) by mouth 3 (three) times daily. 30 capsule 0  . cephALEXin (KEFLEX) 500 MG capsule Take 1 capsule (500 mg total) by mouth 4 (four) times daily. 28 capsule 0  . gabapentin (NEURONTIN) 300 MG capsule Take 1 capsule (300 mg total) by mouth 3 (three) times daily. 90 capsule 3  . metoprolol tartrate (LOPRESSOR) 25 MG tablet Take 0.5 tablets (12.5 mg total) by mouth 2 (two) times daily. Hold dose if BP<100/60 or pulse<50 your are symptomatic 30 tablet 1  .  omeprazole (PRILOSEC) 20 MG capsule Take 1 capsule (20 mg total) by mouth daily. 10 capsule 0   No current facility-administered medications on file prior to visit.    Allergies:   No Active Allergies    There were no vitals filed for this  visit.  Physical Exam General: Pleasant middle-aged African American male, seated, in no evident distress Head: head normocephalic and atraumatic.  Neck: supple with no carotid or supraclavicular bruits Cardiovascular: regular rate and rhythm, no murmurs Musculoskeletal: no deformity Skin:  no rash/petichiae Vascular:  Normal pulses all extremities  Neurologic Exam Mental Status: Awake and fully alert. Oriented to place and time. Recent and remote memory intact. Attention span, concentration and fund of knowledge appropriate. Mood and affect appropriate.  Cranial Nerves: Pupils equal, briskly reactive to light. Extraocular movements full without nystagmus. Visual fields full to confrontation. Hearing intact. Facial sensation intact. Face, tongue, palate moves normally and symmetrically.  Motor: LLE: 4-/5; RLE: 4/5.  Weakness greater in bilateral lower extremity hip flexors and ankle dorsiflexion.  Full strength bilateral upper extremities. Sensory.: intact to touch ,pinprick .position and vibratory sensation.  Coordination: Rapid alternating movements normal in upper extremities. Finger-to-nose performed accurately bilaterally.  Unable to perform heel-to-shin performed accurately bilaterally. Gait and Station: Patient ambulating using rolling walker with slow cautious steps Reflexes: Deep tendon reflexes are brisk throughout.  Toes downgoing.       ASSESSMENT: 64 year-old African-American male with bilateral lower extremity weakness and paresthesias secondary to spinal cord infarct in November 2018 following aortic dissection surgery in September 2018.  Residual deficits of lower extremity weakness and paresthesias   PLAN: -Continue aspirin  162.5mg  for secondary stroke prevention -short-term refill placed for metoprolol as he is unsure of PCP.  Referral placed to internal medicine for establishment of PCP -last refill placed by Dr. Naaman Plummer who is listed as his PCP but unsure if this is accurate. -Recommended continuation of gabapentin 300 mg 3 times daily -advised to ensure he takes this 3 times daily for optimal benefit -Referral placed to OT for fitting of AFO due to foot drop interfering with ambulation -Advised to monitor blood pressure at home -Maintain strict control of hypertension with blood pressure goal below 130/90, diabetes with hemoglobin A1c goal below 6.5% and cholesterol with LDL cholesterol (bad cholesterol) goal below 70 mg/dL. I also advised the patient to eat a healthy diet with plenty of whole grains, cereals, fruits and vegetables, exercise regularly and maintain ideal body weight.  Follow up in 6 months or call earlier if needed  Greater than 50% of time during this 25 minute visit was spent on counseling,explanation of diagnosis of spinal cord infarct and paraplegia, ongoing paresthesias and importance of medication compliance, planning of further management, discussion with patient and coordination of care  Frann Rider, [place]  [facility] [address] [place], [place] [address]  Phone [phone] Fax [phone]

## 2019-07-16 NOTE — Telephone Encounter (Signed)
Patient showed up for appointment today on 2022-07-24, not deceased, unsure of why I was advised this back in November.

## 2019-07-16 NOTE — Progress Notes (Signed)
[facility] [address] [place]. [place] [address] [phone]       OFFICE FOLLOW-UP NOTE  [name] Date of Birth:  1955/12/09 Medical Record Number:  [id number]    Chief Complaint  Patient presents with  . 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  He was previously seen approximately 10 months ago stating "I have been doing well and have not needed anything".  He unfortunately has not established care with PCP since prior visit and has ran out of all prescribed medications.  Previously on gabapentin and Cymbalta for paresthesias and metoprolol which he ran out of "a long time ago".  He has continued on aspirin 325 mg daily without bleeding or bruising.  Blood pressure today 106/78.  He does continue to experience "dizzy spells" with room spinning sensation worsened with position change or quick head movement typically lasting 30 to 60 seconds and then subsides.  Vertigo sensation has been ongoing and denies worsening.  Continues to use rolling walker for ambulation and denies any recent falls.  No further concerns at this time.       Interval history: History provided for reference purposes only Update 09/06/18 JM: Victor [name] continues to be followed in this office regarding T10 spinal cord infarction likely related to recent aortic arch surgery.  Continues to have residual deficits of lower extremity weakness and paresthesias.  At prior visit 04/14/2018, he continued to experience bilateral lower extremity paresthesias but noncompliance and recommended medications such as gabapentin and recently started Cymbalta.  Long discussion regarding use of Cymbalta and  gabapentin and importance of compliance for hopeful pain management.  At today's visit, he discontinued use of Cymbalta but continued on gabapentin 300 mg 1-3 times daily at that time as he would forget to take throughout the day.  He does endorse when he uses 3 times daily, he does gain benefit from this.  He is currently ambulating with a rolling walker but does have residual foot drop L>R with occasional dragging of left leg.  He is also requesting refill for metoprolol.  Continues on aspirin 325 mg without side effects of bleeding or bruising.  No further concerns at this time.    Mr [name] is seen today for first office follow-up visit for hospital admission for paraplegia in November 2018. He is accompanied by his nephew and wife and history is obtained from them as well as review of electronic medical records. I have personally reviewed imaging films. Victor [name] is an 64 y.o. male who presented from home to [facility] on 03/07/2017 with acute lower extremity paralysis. He is a poor and inconsistent historian, requiring questions to be repeated multiple times for clarification many times during the interview. Best information obtained is as follows: He was in [facility] until about 3 weeks ago when he started to feel weak in the legs. He works as a Paediatric nurse and this would be noticed at times during the day, especially when attempting to stand up from a seated position. He denies having had any arm weakness, vision changes, confusion, trouble talking or sensory numbness during the 3 week time period.  He does report having occasional SOB. This weekend, symptoms worsened on Saturday with trouble standing, so he decided to spend most of the day in bed watching TV. He had similar symptoms on Sunday, so he spent the day watching football on TV while lying in bed. He gives inconsistent reports regarding Sunday, at one point stating he spent the whole day in bed, and at another point stating that he could  get up and walk. He states he lay in bed until about 1-2 AM on Monday morning (today), when he then tried to get up but legs gave out from under him. He fell to the floor per one account and slid downwards by another account. He could not get up after he slid/fell to the floor. He called out to his parents, who he lives with, and they called EMS. He denied bowel or bladder incontinence, but states that for the past 3-4 weeks he has had trouble fully emptying his bladder. 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 His clinical exam showed a T10 level sensory level but flaccid paraparesis and MRI scan showed spinal cord hyperintensity from T9 down which was felt compatible with spinal cord infarct. A lumbar epidural drain was placed and he was also started on steroids. 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 Patient showed slight improvement and was able to move lower extremities slightly. No hospitalization. He was seen by physical occupational therapy and transferred for rehabilitation. He has been getting home physical and occupational therapy and plans to start outpatient therapy soon. He has been followed at rehabilitation clinic by Dr. Eda Keys. He has shown some improvement and now is able to walk with a walker at home. He complains of some aching in burning pain in his legs and some constipation. He is able to empty his bladder quite well he has only occasional incontinence.  09/07/17:He   is currently using a rolling walker for ambulation but does still continue to have bilateral lower extremity weakness.  Also has complaints of nerve pain.  He has a order for Neurontin 100  mg 3 times daily but states he is only been taking this twice a day.  Reports that this does help him during the day but at night he has burning pain in bilateral lower extremities where it will wake him up in the middle the night.  Patient does endorse dizziness at time after sitting for prolonged period of time and then going to stand up.    03/14/2018: Patient is being seen today for six-month follow-up visit.  He does continue to experience neuropathy pain in bilateral lower extremities and has since stopped taking gabapentin as it did not help.  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  He does admit to not not doing exercises at recommended during therapy sessions and does state he will start to do those again.  He is currently using rolling walker due to continued lower extremity weakness but has made great improvements.  He has started driving again and has not had any complications doing so.  He continues to take aspirin 162.5 mg daily without side effects of bleeding or bruising.  Blood pressure  today satisfactory 142/75.  No further concerns at this time.  Denies new or worsening stroke/TIA symptoms.  04/14/18: Patient is being seen today due to complaints of dizziness and no benefit of neuropathy pain with starting Cymbalta.  He does complain of dizziness sensation which he states has been occurring since hospital discharge.  He denies any worsening of this but when he informed office staff of his dizziness prompting this appointment, he was requesting medication to help with the dizziness.  He does endorse this happening intermittently and typically worsens upon position changing.  He states he did trial Cymbalta but did not notice any benefit therefore he stopped.  Question how long he was taking this medication and states he stopped taking a month ago but advised patient that it was only started a month ago.  Once this was brought to  patient's attention, he was unable to tell me exactly how long he continued this medication before prior to stopping.  Advised patient that with neuropathy pain especially in his case, it can take a little while for these medications to take effect and is difficult to determine how much benefit he will gain with the medications.  He is agreeable to restarting his medications and to trial them for a longer period of time.  No further concerns at this time.      ROS:   14 system review of systems is positive for weakness, numbness, tingling, pain and the following systems are negative  PMH:  Past Medical History:  Diagnosis Date  . History of kidney stones   . Hypertension   . 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 Stroke (HCC)   . Thoracic aortic aneurysm (HCC) 09/2014    Social History:  Social History   Socioeconomic History  . Marital status: Single    Spouse name: Not on file  . Number of children: Not on file  . Years of education: Not on file  . Highest education level: Not on file  Occupational History  . Not on file  Tobacco Use  . Smoking status: Former Smoker    Years: 35.00    Types: Cigarettes, Cigars    Quit date: 10/07/2014    Years since quitting: 4.7  . Smokeless tobacco: Never Used  Substance and Sexual Activity  . Alcohol use: No    Alcohol/week: 0.0 standard drinks  . Drug use: No  . Sexual activity: Not on file  Other Topics Concern  . Not on file  Social History Narrative  . Not on file   Social Determinants of Health   Financial Resource Strain:   . Difficulty of Paying Living Expenses:   Food Insecurity:   . Worried About Programme researcher, broadcasting/film/video in the Last Year:   . Barista in the Last Year:   Transportation Needs:   . Freight forwarder (Medical):   Marland Kitchen Lack of Transportation (Non-Medical):   Physical Activity:   . Days of Exercise per Week:   . Minutes of Exercise per Session:   Stress:   . Feeling of Stress :   Social  Connections:   . Frequency of Communication with Friends and Family:   . Frequency of Social Gatherings with Friends and Family:   . Attends Religious Services:   . Active Member of Clubs or Organizations:   . Attends Banker Meetings:   Marland Kitchen Marital Status:   Intimate Partner Violence:   . Fear of Current or Ex-Partner:   .  Emotionally Abused:   Marland Kitchen Physically Abused:   . Sexually Abused:     Medications:   Current Outpatient Medications on File Prior to Visit  Medication Sig Dispense Refill  . aspirin 325 MG tablet Take 0.5 tablets (162.5 mg total) by mouth daily. 15 tablet 3   No current facility-administered medications on file prior to visit.    Allergies:   No Active Allergies    Vitals:   07/16/19 1033  BP: 106/78  Pulse: 100  Temp: (!) 97 F (36.1 C)    Physical Exam General: Pleasant middle-aged African American male, seated, in no evident distress Head: head normocephalic and atraumatic.  Neck: supple with no carotid or supraclavicular bruits Cardiovascular: regular rate and rhythm, no murmurs Musculoskeletal: no deformity Skin:  no rash/petichiae Vascular:  Normal pulses all extremities  Neurologic Exam Mental Status: Awake and fully alert. Oriented to place and time. Recent and remote memory intact. Attention span, concentration and fund of knowledge appropriate. Mood and affect appropriate.  Cranial Nerves: Pupils equal, briskly reactive to light. Extraocular movements full without nystagmus. Visual fields full to confrontation. Hearing intact. Facial sensation intact. Face, tongue, palate moves normally and symmetrically.  Motor: LLE: 4-/5; RLE: 4/5.  Weakness greater in bilateral lower extremity hip flexors and ankle dorsiflexion.  Full strength bilateral upper extremities. Sensory.: intact to touch ,pinprick .position and vibratory sensation.  Coordination: Rapid alternating movements normal in upper extremities. Finger-to-nose performed  accurately bilaterally.  Unable to perform heel-to-shin performed accurately bilaterally. Gait and Station: Patient ambulating using rolling walker with occasional dragging of left foot and mild imbalance Reflexes: Deep tendon reflexes are brisk throughout.  Toes downgoing.       ASSESSMENT: 64 year-old African-American male with bilateral lower extremity weakness and paresthesias secondary to spinal cord infarct in November 2018 following aortic dissection surgery in September 2018.  Residual deficits of lower extremity weakness and paresthesias.  Also concerns of ongoing vertigo type symptoms concern for BPPV   PLAN: -Continue aspirin 162.5mg  for secondary stroke prevention -Advised patient importance of establishing care with PCP -will place referral to community health and wellness to establish care for ongoing chronic disease management  -He requests refill of metoprolol but as blood pressure and heart rate satisfactory today's visit, will hold off on initiating at this time -Restart gabapentin 300 mg 3 times daily for poststroke paresthesias -refill placed to pharmacy -Referral placed to outpatient PT for further evaluation of possible BPPV.  No indication on initiating medication at this time for symptom relief as patient requests.  Advised him greater benefit with use of outpatient therapy -Maintain strict control of hypertension with blood pressure goal below 130/90, diabetes with hemoglobin A1c goal below 6.5% and cholesterol with LDL cholesterol (bad cholesterol) goal below 70 mg/dL. I also advised the patient to eat a healthy diet with plenty of whole grains, cereals, fruits and vegetables, exercise regularly and maintain ideal body weight.  Follow up in 6 months or call earlier if needed  I spent 23 minutes of face-to-face and non-face-to-face time with patient.  This included previsit chart review, lab review, study review, order entry, electronic health record documentation,  patient education   Ihor Austin, [facility]  [facility] [address] [place], [place] [address]  Phone [phone] Fax [phone]

## 2019-07-27 NOTE — Progress Notes (Signed)
I agree with the above plan 

## 2019-08-07 ENCOUNTER — Ambulatory Visit: Payer: Medicaid Other | Attending: Adult Health | Admitting: Physical Therapy

## 2019-08-08 ENCOUNTER — Ambulatory Visit: Payer: Medicaid Other | Admitting: Physical Therapy

## 2019-11-11 ENCOUNTER — Other Ambulatory Visit: Payer: Self-pay | Admitting: Adult Health

## 2020-01-16 ENCOUNTER — Ambulatory Visit: Payer: Medicaid Other | Admitting: Adult Health

## 2020-01-29 ENCOUNTER — Other Ambulatory Visit: Payer: Self-pay

## 2020-01-29 ENCOUNTER — Ambulatory Visit (INDEPENDENT_AMBULATORY_CARE_PROVIDER_SITE_OTHER): Payer: Medicaid Other | Admitting: Adult Health

## 2020-01-29 ENCOUNTER — Encounter: Payer: Self-pay | Admitting: Adult Health

## 2020-01-29 VITALS — BP 143/84 | HR 88 | Ht 68.0 in | Wt 148.0 lb

## 2020-01-29 DIAGNOSIS — G9511 Acute infarction of spinal cord (embolic) (nonembolic): Secondary | ICD-10-CM

## 2020-01-29 DIAGNOSIS — I10 Essential (primary) hypertension: Secondary | ICD-10-CM | POA: Diagnosis not present

## 2020-01-29 DIAGNOSIS — R42 Dizziness and giddiness: Secondary | ICD-10-CM | POA: Diagnosis not present

## 2020-01-29 MED ORDER — GABAPENTIN 300 MG PO CAPS: 300.0000 mg | ORAL_CAPSULE | Freq: Three times a day (TID) | ORAL | 3 refills | Status: DC

## 2020-01-29 NOTE — Patient Instructions (Signed)
Take gabapentin 300mg  three times daily - please ensure you take every day three times a day to assess actual benefit. If you have difficulty remembering afternoon dose, you can take 2 capsules (600mg ) at night time and take the 300mg  capsule in the morning  Continue aspirin for secondary stroke prevention  Highly recommend monitoring blood pressure at home as some of your may be due to blood pressure Some of your dizziness may be due benign positional vertigo - referral placed to neuro rehab for vestibular therapy - please ensure you attend these sessions   Continue to follow up with PCP regarding blood pressure management  Maintain strict control of hypertension with blood pressure goal below 130/90     Followup in the future with me in 6 months or call earlier if needed     Thank you for coming to see at [facility]. I hope we have been able to provide you high quality care today.  You may receive a patient satisfaction survey over the next few weeks. We would appreciate your feedback and comments so that we may continue to improve ourselves and the health of our patients.

## 2020-01-29 NOTE — Progress Notes (Signed)
[facility] [address] [place]. [place] [address] [phone]       OFFICE FOLLOW-UP NOTE  [name] Date of Birth:  [birth year] Medical Record Number:  [id number]    Chief Complaint  Patient presents with   Follow-up    rm 9 Pt said he gets dizzy once and a while.      HPI:   Victor [name] is a 64 year old male with PMH significant for T10 spinal cord infarction 02/2017 with residual lower extremity weakness and paresthesias, thoracic aortic aneurysm with type I dissection s/p repair 12/2016 and HTN.  He has been routinely followed in this office since his spinal cord infarct.  Today, 01/29/2020, Victor [name] returns for 6 months follow-up visit previously seen on 07/16/2019.  He continues to complain of lower extremity paresthesias and dizziness/vertigo.  Reports only taking gabapentin occasionally as he is unsure of benefit even though this was previously discussed.  Previously referred to neuro rehab for vestibular therapy for dizziness/vertigo complaints but he was a no-show for 2 scheduled visits.  He does report occasional dizziness/lightheadedness sensation with position changes but will also experience vertigo type sensation with quick head movements.  He also experience occasional gait difficulty with imbalance.  Ambulates with rolling walker but does admit to being sedentary with limited physical activity.  Per patient, he was not aware of scheduled visits.  Remains on aspirin 162.5mg  daily without bleeding or bruising.  Blood pressure today 143/84.  Previously placed referral to establish care with PCP but he has not been contacted to schedule initial evaluation.  No further concerns at this time.      ROS:   14 system review of systems is positive for those listed in HPI and the following systems are negative  PMH:  Past Medical History:  Diagnosis Date   History of kidney stones    Hypertension    Spinal cord infarction (HCC) 02/2017    Stroke [facility])    Thoracic aortic aneurysm (HCC) 09/2014    Social History:  Social History   Socioeconomic History   Marital status: Single    Spouse name: Not on file   Number of children: Not on file   Years of education: Not on file   Highest education level: Not on file  Occupational History   Not on file  Tobacco Use   Smoking status: Former Smoker    Years: 35.00    Types: Cigarettes, Cigars    Quit date: 10/07/2014    Years since quitting: 5.3   Smokeless tobacco: Never Used  Vaping Use   Vaping Use: Never used  Substance and Sexual Activity   Alcohol use: No    Alcohol/week: 0.0 standard drinks   Drug use: No   Sexual activity: Not on file  Other Topics Concern   Not on file  Social History Narrative   Not on file   Social Determinants of Health   Financial Resource Strain:    Difficulty of Paying Living Expenses: Not on file  Food Insecurity:    Worried About Programme researcher, broadcasting/film/video in the Last Year: Not on file   [employer] of Food in the Last Year: Not on file  Transportation Needs:    Lack of Transportation (Medical): Not on file   Lack of Transportation (Non-Medical): Not on file  Physical Activity:    Days of Exercise per Week: Not on file   Minutes of Exercise per Session: Not on file  Stress:  Feeling of Stress : Not on file  Social Connections:    Frequency of Communication with Friends and Family: Not on file   Frequency of Social Gatherings with Friends and Family: Not on file   Attends Religious Services: Not on file   Active Member of Clubs or Organizations: Not on file   Attends Banker Meetings: Not on file   Marital Status: Not on file  Intimate Partner Violence:    Fear of Current or Ex-Partner: Not on file   Emotionally Abused: Not on file   Physically Abused: Not on file   Sexually Abused: Not on file    Medications:   Current Outpatient Medications on File Prior to Visit  Medication Sig  Dispense Refill   aspirin 325 MG tablet Take 0.5 tablets (162.5 mg total) by mouth daily. 15 tablet 3   No current facility-administered medications on file prior to visit.    Allergies:   No Active Allergies    Vitals:   01/29/20 1035  BP: (!) 143/84  Pulse: 88    Physical Exam General: Pleasant middle-aged African American male, seated, in no evident distress Neck: supple with no carotid or supraclavicular bruits Cardiovascular: regular rate and rhythm, no murmurs Vascular:  Normal pulses all extremities  Neurologic Exam Mental Status: Awake and fully alert.  Fluent speech and language.  Oriented to place and time. Recent and remote memory intact. Attention span, concentration and fund of knowledge appropriate. Mood and affect appropriate.  Cranial Nerves: Pupils equal, briskly reactive to light. Extraocular movements full without nystagmus. Visual fields full to confrontation. Hearing intact. Facial sensation intact. Face, tongue, palate moves normally and symmetrically.  Motor: BLE 4/5 hip flexors and 3/5 ankle dorsiflexion.  Full strength upper extremities. Sensory.: intact to touch ,pinprick .position and vibratory sensation.  Coordination: Rapid alternating movements normal in upper extremities. Finger-to-nose performed accurately bilaterally.  Unable to perform heel-to-shin performed accurately bilaterally due to weakness. Gait and Station: Stands from seated position without difficulty.  Patient ambulating short distance using rolling walker with occasional dragging of left foot and mild imbalance Reflexes: Deep tendon reflexes are brisk throughout.  Toes downgoing.       ASSESSMENT/PLAN: 64 year-old African-American male with bilateral lower extremity weakness and paresthesias secondary to spinal cord infarct in November 2018 following aortic dissection surgery in September 2018.  Residual deficits of lower extremity weakness and paresthesias.  Also concerns of ongoing  vertigo type symptoms concern for BPPV   Hx of spinal cord infarct -Residual lower extremity weakness and paresthesias -discussed taking gabapentin 300 mg 3 times daily and if he receives no benefit after dosage, would recommend further increasing if indicated -Patient provided with handicap placard -Continue aspirin 162.5mg  for secondary stroke prevention -Discussed secondary stroke prevention measures and importance of establishing care with PCP for routine follow-up for aggressive stroke risk factor management  -Referral again placed for internal medicine to establish care with PCP   Vertigo -?  BPPV -referral placed to neuro rehab PT for vestibular therapy     Follow up in 6 months or call earlier if needed   I spent 30 minutes of face-to-face and non-face-to-face time with patient.  This included previsit chart review, lab review, study review, order entry, electronic health record documentation, patient education and discussion regarding history of spinal cord infarct with residual deficits, ongoing vertigo, importance of managing stroke risk factors and stroke prevention measures and answered all questions to patient satisfaction   Ihor Austin, AGNP-BC  [facility] [address] [place], [place] [address]  Phone [phone] Fax [phone]

## 2020-01-29 NOTE — Progress Notes (Signed)
I agree with the above plan 

## 2020-04-04 ENCOUNTER — Telehealth: Payer: Self-pay | Admitting: Neurology

## 2020-04-04 ENCOUNTER — Other Ambulatory Visit: Payer: Self-pay | Admitting: Neurology

## 2020-04-04 MED ORDER — MELOXICAM 15 MG PO TBDP: 15.0000 mg | ORAL_TABLET | Freq: Every day | ORAL | 3 refills | Status: DC

## 2020-04-04 NOTE — Telephone Encounter (Signed)
He called that he has been having more pain especially in back and legs.   Gabapentin has not helped.  I will send in a prescription for meloxicam.  If it does not help he would like a referral to pain management.  I let him know I would forward a message to Ihor Austin who last saw him.

## 2020-04-08 NOTE — Telephone Encounter (Signed)
Appreciate your assistance.  He has been noncompliant with gabapentin taking sporadically and only short duration and then would self discontinue as he did not feel medication was helping him.  We will see if the meloxicam helps otherwise I will gladly send a referral to pain management for further assistance.  Thank you again.

## 2020-04-10 ENCOUNTER — Telehealth: Payer: Self-pay | Admitting: Adult Health

## 2020-04-10 NOTE — Telephone Encounter (Signed)
I called pt and LMVM for him is the meloxicam the medication he is talking about.??  Use [facility] app for getting generic.

## 2020-04-10 NOTE — Telephone Encounter (Signed)
Pt states the last medication called in for him cost $200.00 which is too expensive for him.  Please call pt to discuss other options

## 2020-04-14 MED ORDER — MELOXICAM 15 MG PO TABS: 15.0000 mg | ORAL_TABLET | Freq: Every day | ORAL | 3 refills | Status: DC

## 2020-04-14 NOTE — Telephone Encounter (Signed)
I called pt and he states he cannot take meloxicam due to cost.  Usually his cost is $3.00 due to medicaid.  He did not know why so costly.  He wants to see if something else is available that is preferred thru medicaid.  Even $10.00 is too much.  I called the pharmacy and the meloxicam tablets ar $3. Will cahnge prescription for him.

## 2020-04-14 NOTE — Telephone Encounter (Signed)
I spoke to pt and relayed that medication melaoxicam was changed to tablet to swallow.  Cost will be $3.00.  He appreciated call.

## 2020-04-14 NOTE — Addendum Note (Signed)
Addended by: Hermenia Fiscal S on: 04/14/2020 10:30 AM   Modules accepted: Orders

## 2020-05-28 ENCOUNTER — Telehealth: Payer: Self-pay | Admitting: Adult Health

## 2020-05-28 MED ORDER — GABAPENTIN 300 MG PO CAPS: 300.0000 mg | ORAL_CAPSULE | Freq: Three times a day (TID) | ORAL | 0 refills | Status: DC

## 2020-05-28 NOTE — Telephone Encounter (Signed)
Can we see when he filled his last rx?

## 2020-05-28 NOTE — Telephone Encounter (Signed)
Spoke to pharmacist.  Last filled 05-10-2020 for #[id number]. Before that 01/2020. Only medication picked up.  Meloxicam was never picked up.  Only twice he picked up at this pharmacy. Not on drug registry.  Want to pay out of pocket to get until medicaid will fill again.

## 2020-05-28 NOTE — Telephone Encounter (Signed)
Noted  

## 2020-05-28 NOTE — Telephone Encounter (Signed)
One-time refill sent to pharmacy.  There has been no prior concern of gabapentin or any other medication misuse or abuse.

## 2020-05-28 NOTE — Telephone Encounter (Signed)
[facility] ([place] called, Pt said he lost his medication, gabapentin (NEURONTIN) 300 MG capsule in his cab. Ask Pt if he filed a police report, he said they were not stolen. Pt said he would pay $135. Want to know if there are any red flags on him? Would like for the physician to call me back.  Contact no: [phone]

## 2020-07-30 ENCOUNTER — Encounter: Payer: Self-pay | Admitting: Adult Health

## 2020-07-30 ENCOUNTER — Ambulatory Visit: Payer: Medicaid Other | Admitting: Adult Health

## 2020-07-30 VITALS — BP 144/80 | HR 90 | Ht 67.0 in | Wt 161.0 lb

## 2020-07-30 DIAGNOSIS — F419 Anxiety disorder, unspecified: Secondary | ICD-10-CM | POA: Diagnosis not present

## 2020-07-30 DIAGNOSIS — R7309 Other abnormal glucose: Secondary | ICD-10-CM | POA: Diagnosis not present

## 2020-07-30 DIAGNOSIS — E785 Hyperlipidemia, unspecified: Secondary | ICD-10-CM | POA: Diagnosis not present

## 2020-07-30 DIAGNOSIS — R202 Paresthesia of skin: Secondary | ICD-10-CM

## 2020-07-30 DIAGNOSIS — Z79899 Other long term (current) drug therapy: Secondary | ICD-10-CM | POA: Diagnosis not present

## 2020-07-30 DIAGNOSIS — F5105 Insomnia due to other mental disorder: Secondary | ICD-10-CM | POA: Diagnosis not present

## 2020-07-30 DIAGNOSIS — G9511 Acute infarction of spinal cord (embolic) (nonembolic): Secondary | ICD-10-CM | POA: Diagnosis not present

## 2020-07-30 MED ORDER — GABAPENTIN 300 MG PO CAPS: 300.0000 mg | ORAL_CAPSULE | Freq: Three times a day (TID) | ORAL | 3 refills | Status: DC

## 2020-07-30 MED ORDER — AMITRIPTYLINE HCL 10 MG PO TABS: 10.0000 mg | ORAL_TABLET | Freq: Every day | ORAL | 3 refills | Status: DC

## 2020-07-30 NOTE — Progress Notes (Signed)
[facility] [address] [place]. [place] [address] [phone]       OFFICE FOLLOW-UP NOTE  Victor [name] Date of Birth:  1955/09/28 Medical Record Number:  [id number]    Chief Complaint  Patient presents with  . 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  He has been routinely followed in this office since his spinal cord infarct.  Today, 07/30/2020, Victor [name] returns for 6 months follow-up unaccompanied  Reports continued lower extremity paresthesias and weakness Use of gabapentin 300mg  three times daily with some improvement of paresthesias  Continue use of rolling walker without any recent falls Remains on aspirin -denies associated side effects Blood pressure today 144/80  He also reports difficulty sleeping at night with mind wandering  He has not yet established care with PCP  No new concerns at this time     ROS:   14 system review of systems is positive for those listed in HPI and the following systems are negative  PMH:  Past Medical History:  Diagnosis Date  . History of kidney stones   . Hypertension   . 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 Stroke (HCC)   . Thoracic aortic aneurysm (HCC) 09/2014    Social History:  Social History   Socioeconomic History  . Marital status: Single    Spouse name: Not on file  . Number of children: Not on file  . Years of education: Not on file  . Highest education level: Not on file  Occupational History  . Not on file  Tobacco Use  . Smoking status: Former Smoker    Years: 35.00    Types: Cigarettes, Cigars    Quit date: 10/07/2014    Years since quitting: 5.8  . Smokeless tobacco: Never Used  Vaping  Use  . Vaping Use: Never used  Substance and Sexual Activity  . Alcohol use: No    Alcohol/week: 0.0 standard drinks  . Drug use: No  . Sexual activity: Not on file  Other Topics Concern  . Not on file  Social History Narrative  . Not on file   Social Determinants of Health   Financial Resource Strain: Not on file  Food Insecurity: Not on file  Transportation Needs: Not on file  Physical Activity: Not on file  Stress: Not on file  Social Connections: Not on file  Intimate Partner Violence: Not on file    Medications:   Current Outpatient Medications on File Prior to Visit  Medication Sig Dispense Refill  . aspirin 325 MG tablet Take 0.5 tablets (162.5 mg total) by mouth daily. 15 tablet 3   No current facility-administered medications on file prior to visit.    Allergies:   No Active Allergies    Today's Vitals   07/30/20 1049  BP: (!) 144/80  Pulse: 90  Weight: 161 lb (73 kg)  Height: 5\' 7"  (1.702 m)   Body mass index is 25.22 kg/m.  Physical Exam General: Pleasant middle-aged African American male, seated, in no evident distress Neck: supple with no carotid or supraclavicular bruits Cardiovascular: regular rate and rhythm, no murmurs Vascular:  Normal pulses all extremities  Neurologic Exam Mental  Status: Awake and fully alert.  Fluent speech and language.  Oriented to place and time. Recent and remote memory intact. Attention span, concentration and fund of knowledge appropriate. Mood and affect appropriate.  Cranial Nerves: Pupils equal, briskly reactive to light. Extraocular movements full without nystagmus. Visual fields full to confrontation. Hearing intact. Facial sensation intact. Face, tongue, palate moves normally and symmetrically.  Motor: Full strength bilateral upper extremities; LLE: 4+/5 proximal and 4/5 distal; RLE 4/5 proximal and 3/5 distal.  Full strength upper extremities. Sensory.: intact to touch ,pinprick .position and vibratory  sensation.  Coordination: Rapid alternating movements normal in upper extremities. Finger-to-nose performed accurately bilaterally.  Unable to perform heel-to-shin performed accurately bilaterally due to weakness. Gait and Station: Stands from seated position without difficulty.  Patient ambulating short distance using rolling walker with occasional dragging of left foot and mild imbalance Reflexes: Deep tendon reflexes are brisk throughout.  Toes downgoing.       ASSESSMENT/PLAN:  65 year-old African-American male with bilateral lower extremity weakness and paresthesias secondary to spinal cord infarct in November 2018 following aortic dissection surgery in September 2018.  Residual deficits of lower extremity weakness and paresthesias.     Hx of spinal cord infarct -Residual lower extremity weakness and paresthesias -on gabapentin 300 mg 3 times daily -refill provided -Patient provided with handicap placard -Continue aspirin 162.5mg  for secondary stroke prevention -Discussed secondary stroke prevention measures and importance of establishing care with PCP for routine follow-up for aggressive stroke risk factor management  -Referral again placed for internal medicine to establish care with PCP  Anxiety with insomnia -Start amitriptyline 10 mg nightly which may also benefit paresthesias -advised to call office after 2-3 weeks if no benefit for possible need of dosage adjustment -again advised that he needs to establish care with PCP for routine general monitoring and management  Will obtain lab work today as none have been completed over the past year    Follow up in 6 months or call earlier if needed  CC:  [facility] provider: Dr. Pearlean Brownie  I spent 30 minutes of face-to-face and non-face-to-face time with patient.  This included previsit chart review, lab review, study review, order entry, electronic health record documentation, patient education and discussion regarding history of spinal  cord infarct with residual deficits, importance of managing stroke risk factors and stroke prevention measures, anxiety likely contributing to insomnia, and answered all other questions to patient satisfaction  Ihor Austin, [facility]  [facility] [address] [place], [place] [address]  Phone [phone] Fax [phone]

## 2020-07-30 NOTE — Progress Notes (Signed)
I agree with the above plan 

## 2020-07-30 NOTE — Patient Instructions (Signed)
Continue aspirin for secondary stroke prevention  Please ensure you establish care with a primary care provider for routine management and monitoring of chronic conditions  Continue gabapentin 300mg  three times daily  Start amitriptyline 10mg  nightly  Continue to follow up with PCP regarding cholesterol and blood pressure management  Maintain strict control of hypertension with blood pressure goal below 130/90 and cholesterol with LDL cholesterol (bad cholesterol) goal below 70 mg/dL.       Followup in the future with me in 6 months or call earlier if needed       Thank you for coming to see at [facility]. I hope we have been able to provide you high quality care today.  You may receive a patient satisfaction survey over the next few weeks. We would appreciate your feedback and comments so that we may continue to improve ourselves and the health of our patients.

## 2020-07-31 LAB — COMPREHENSIVE METABOLIC PANEL
ALT: 9 IU/L (ref 0–44)
AST: 11 IU/L (ref 0–40)
Albumin/Globulin Ratio: 1.7 (ref 1.2–2.2)
Albumin: 4.3 g/dL (ref 3.8–4.8)
Alkaline Phosphatase: 81 IU/L (ref 44–121)
BUN/Creatinine Ratio: 16 (ref 10–24)
BUN: 17 mg/dL (ref 8–27)
Bilirubin Total: 0.8 mg/dL (ref 0.0–1.2)
CO2: 22 mmol/L (ref 20–29)
Calcium: 9.2 mg/dL (ref 8.6–10.2)
Chloride: 106 mmol/L (ref 96–106)
Creatinine, Ser: 1.09 mg/dL (ref 0.76–1.27)
Globulin, Total: 2.6 g/dL (ref 1.5–4.5)
Glucose: 96 mg/dL (ref 65–99)
Potassium: 4.2 mmol/L (ref 3.5–5.2)
Sodium: 145 mmol/L — ABNORMAL HIGH (ref 134–144)
Total Protein: 6.9 g/dL (ref 6.0–8.5)
eGFR: 76 mL/min/{1.73_m2} (ref >59)

## 2020-07-31 LAB — CBC
Hematocrit: 50.3 % (ref 37.5–51.0)
Hemoglobin: 15.6 g/dL (ref 13.0–17.7)
MCH: 26.6 pg (ref 26.6–33.0)
MCHC: 31 g/dL — ABNORMAL LOW (ref 31.5–35.7)
MCV: 86 fL (ref 79–97)
Platelets: 247 10*3/uL (ref 150–450)
RBC: 5.86 x10E6/uL — ABNORMAL HIGH (ref 4.14–5.80)
RDW: 14.4 % (ref 11.6–15.4)
WBC: 5.9 10*3/uL (ref 3.4–10.8)

## 2020-07-31 LAB — LIPID PANEL
Chol/HDL Ratio: 3 ratio (ref 0.0–5.0)
Cholesterol, Total: 138 mg/dL (ref 100–199)
HDL: 46 mg/dL (ref >39)
LDL Chol Calc (NIH): 77 mg/dL (ref 0–99)
Triglycerides: 74 mg/dL (ref 0–149)
VLDL Cholesterol Cal: 15 mg/dL (ref 5–40)

## 2020-07-31 LAB — HEMOGLOBIN A1C
Est. average glucose Bld gHb Est-mCnc: 128 mg/dL
Hgb A1c MFr Bld: 6.1 % — ABNORMAL HIGH (ref 4.8–5.6)

## 2020-08-04 ENCOUNTER — Telehealth: Payer: Self-pay

## 2020-08-04 NOTE — Telephone Encounter (Signed)
Contacted pt regarding labs, LVM per DPR, informing him that recent lab work largely satisfactory. Slight elevation of A1c currently at 6.1 and highly encourage modifying diet with limiting carbohydrates as he is at risk of developing diabetes. Lipid panel satisfactory and recommend continuing current regimen. Also reminded him to establishe care with PCP who will continue to monitor ongoing. Advised to call the office back with questions.

## 2020-08-04 NOTE — Telephone Encounter (Signed)
-----   Message from Ihor Austin, NP sent at 07/31/2020  4:54 PM EDT ----- Please advise patient that recent lab work largely satisfactory.  Slight elevation of A1c currently at 6.1 and highly encourage modifying diet with limiting carbohydrates as he is at risk of developing diabetes.  Lipid panel satisfactory and recommend continuing current regimen.  Please remind him to ensure he establishes care with PCP who will continue to monitor ongoing. Thank you

## 2020-08-05 NOTE — Telephone Encounter (Signed)
Contacted pt again regarding labs, LVM asking him to give [place] a call back

## 2020-08-05 NOTE — Telephone Encounter (Signed)
Pt called, can not get into my voicemail. Would like a call from the nurse to discuss results.

## 2020-08-06 NOTE — Telephone Encounter (Signed)
Pt called the office back, informed him that recent lab work largely satisfactory.  Slight elevation of A1c currently at 6.1 and highly encourage modifying diet with limiting carbohydrates as he is at risk of developing diabetes.  Lipid panel satisfactory and recommend continuing current regimen.  Reminded him to ensure he establishes care with PCP who will continue to monitor ongoing. He informed me that he doesn't have a PCP,advised him he will need to establish care with one soon. Advised im to call the office with further questions, He understood.

## 2020-08-06 NOTE — Telephone Encounter (Signed)
Contacted pt again for the third time, LVM asking him to return my call.

## 2021-01-14 ENCOUNTER — Telehealth: Payer: Self-pay | Admitting: Adult Health

## 2021-01-14 NOTE — Telephone Encounter (Signed)
Patient requesting refill on gabapentin. He uses [facility]. Best call back [phone]

## 2021-01-14 NOTE — Telephone Encounter (Signed)
Contacted pharmacy to verify refills available pt was given yrs worth in April, pharmacy advised me that he is two refills left and can not pick up until at least first week of October due to it being to soon.   Contacted pt, informed him of this. He is not sure why he is running out so soon. Advised to call pharmacy in October for refill. He was appreciative of call back

## 2021-02-03 ENCOUNTER — Encounter: Payer: Self-pay | Admitting: Adult Health

## 2021-02-03 ENCOUNTER — Ambulatory Visit: Payer: Medicaid Other | Admitting: Adult Health

## 2021-02-03 VITALS — BP 132/86 | HR 87 | Ht 67.0 in | Wt 158.0 lb

## 2021-02-03 DIAGNOSIS — G9511 Acute infarction of spinal cord (embolic) (nonembolic): Secondary | ICD-10-CM | POA: Diagnosis not present

## 2021-02-03 DIAGNOSIS — R7309 Other abnormal glucose: Secondary | ICD-10-CM

## 2021-02-03 DIAGNOSIS — E785 Hyperlipidemia, unspecified: Secondary | ICD-10-CM

## 2021-02-03 DIAGNOSIS — R202 Paresthesia of skin: Secondary | ICD-10-CM | POA: Diagnosis not present

## 2021-02-03 MED ORDER — GABAPENTIN 300 MG PO CAPS: ORAL_CAPSULE | ORAL | 5 refills | Status: DC

## 2021-02-03 NOTE — Patient Instructions (Signed)
Your Plan:  Increase gabapentin nighttime dose to 600 mg (2 capsules) and continue taking 300 mg in the morning and 300 mg in the afternoon.  Please let me know after 3 to 4 weeks if no benefit  Referral placed to establish care with a new PCP -if you are not contacted within the next couple of weeks, please let me know    Follow-up in 6 months or call earlier if needed     Thank you for coming to see [place] at [facility]. I hope we have been able to provide you high quality care today.  You may receive a patient satisfaction survey over the next few weeks. We would appreciate your feedback and comments so that we may continue to improve ourselves and the health of our patients.

## 2021-02-03 NOTE — Progress Notes (Signed)
[facility] [address] [place]. [place] [address] [phone]       OFFICE FOLLOW-UP NOTE  [name] Date of Birth:  [birth year] Medical Record Number:  [id number]    Reason for visit: Spinal cord infarct  Chief Complaint  Patient presents with   Follow-up    Rm 2 alone here for 6 month f/u- reports he has been doing about the same. Reports he is not taking the amitriptyline, reports he cannot remember when he stopped the medication but felt like it was not helping with his sleep.      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  He has been routinely followed in this office since his spinal cord infarct.  Today, 02/03/2021, Victor [name] returns for stroke follow-up.  Previously seen 07/30/2020 - added amitriptyline due to continued dysesthesias and insomnia. No other changes at that time. Reports no benefit with use of amitriptyline although unable to state how long he tried for.  He has remained on gabapentin 300 mg TID with some improvement of dysesthesias initially but continues to experience pain.  Reports continued insomnia.  Continued use of rolling walker -no recent falls.  Compliant on aspirin without side effects.  Blood pressure today 132/86.  He has not yet established care with PCP.  No new concerns at this time.       ROS:   14 system review of systems is positive for those listed in HPI and the following systems are negative  PMH:  Past Medical History:  Diagnosis Date   History of kidney stones    Hypertension    Spinal cord infarction (HCC) 02/2017   Stroke [facility])    Thoracic aortic aneurysm 09/2014    Social History:  Social History   Socioeconomic History   Marital status: Single    Spouse name: Not on file   Number of  children: Not on file   Years of education: Not on file   Highest education level: Not on file  Occupational History   Not on file  Tobacco Use   Smoking status: Former    Years: 35.00    Types: Cigarettes, Cigars    Quit date: 10/07/2014    Years since quitting: 6.3   Smokeless tobacco: Never  Vaping Use   Vaping Use: Never used  Substance and Sexual Activity   Alcohol use: No    Alcohol/week: 0.0 standard drinks   Drug use: No   Sexual activity: Not on file  Other Topics Concern   Not on file  Social History Narrative   Not on file   Social Determinants of Health   Financial Resource Strain: Not on file  Food Insecurity: Not on file  Transportation Needs: Not on file  Physical Activity: Not on file  Stress: Not on file  Social Connections: Not on file  Intimate Partner Violence: Not on file    Medications:   Current Outpatient Medications on File Prior to Visit  Medication Sig Dispense Refill   aspirin 325 MG tablet Take 0.5 tablets (162.5 mg total) by mouth daily. 15 tablet 3   gabapentin (NEURONTIN) 300 MG capsule Take 1 capsule (300 mg total) by mouth 3 (three) times daily. 270 capsule 3   amitriptyline (ELAVIL) 10 MG tablet Take 1 tablet (10  mg total) by mouth at bedtime. (Patient not taking: Reported on 02/03/2021) 90 tablet 3   No current facility-administered medications on file prior to visit.    Allergies:   No Active Allergies    Today's Vitals   02/03/21 1031  BP: 132/86  Pulse: 87  SpO2: 97%  Weight: 158 lb (71.7 kg)  Height: 5\' 7"  (1.702 m)   Body mass index is 24.75 kg/m.  Physical Exam General: Pleasant middle-aged American male, seated, in no evident distress Neck: supple with no carotid or supraclavicular bruits Cardiovascular: regular rate and rhythm, no murmurs Vascular:  Normal pulses all extremities  Neurologic Exam Mental Status: Awake and fully alert.  Fluent speech and language.  Oriented to place and time. Recent  and remote memory intact. Attention span, concentration and fund of knowledge appropriate. Mood and affect appropriate.  Cranial Nerves: Pupils equal, briskly reactive to light. Extraocular movements full without nystagmus. Visual fields full to confrontation. Hearing intact. Facial sensation intact. Face, tongue, palate moves normally and symmetrically.  Motor: Full strength bilateral upper extremities; LLE: 4+/5 proximal and 4/5 distal; RLE 4/5 proximal and 3/5 distal.  Full strength upper extremities. Sensory.: intact to touch ,pinprick .position and vibratory sensation.  Coordination: Rapid alternating movements normal in upper extremities. Finger-to-nose performed accurately bilaterally.  Unable to perform heel-to-shin performed accurately bilaterally due to weakness. Gait and Station: Stands from seated position without difficulty.  Patient ambulating with slight decreased stride length and step height and mild imbalance with use of Rollator walker.  Tandem walk and heel toe not attempted. Reflexes: Deep tendon reflexes are brisk throughout.  Toes downgoing.         ASSESSMENT/PLAN:  65 year-old African-American male with bilateral lower extremity weakness and paresthesias secondary to spinal cord infarct in November 2018 following aortic dissection surgery in September 2018.  Residual deficits of lower extremity weakness and dysesthesias   Hx of spinal cord infarct -Residual lower extremity weakness and dysesthesias -increase nighttime dose of gabapentin to 600 mg nightly and continue a.m. dose 300 mg and afternoon dose 300 mg.  Advised to call after 4 weeks if no benefit -Patient provided with handicap placard -Continue  aspirin 162.5mg   for secondary stroke prevention -Discussed secondary stroke prevention measures and importance of establishing care with PCP for routine follow-up for aggressive stroke risk factor management  -Referral placed (AGAIN) to establish care with PCP -prior  referrals quickly closed as unable to reach patient -discussed importance of establishing care with PCP and to ensure he is answering telephone calls.  Advised to let October 2018 know if he is not contacted within 1 month    Follow up in 6 months or call earlier if needed    I spent 26 minutes of face-to-face and non-face-to-face time with patient.  This included previsit chart review, lab review, study review, order entry, electronic health record documentation, patient education and discussion regarding history of spinal cord infarct with residual deficits, importance of managing stroke risk factors and stroke prevention measures and answered all other questions to patient satisfaction  [place], [facility]  [facility] [address] [place], [place]  Phone [phone] Fax [phone]

## 2021-04-21 ENCOUNTER — Emergency Department (HOSPITAL_COMMUNITY)
Admission: EM | Admit: 2021-04-21 | Discharge: 2021-04-21 | Disposition: A | Discharge status: Home / Self Care | Payer: Medicaid Other | Attending: Emergency Medicine | Admitting: Emergency Medicine

## 2021-04-21 ENCOUNTER — Emergency Department (HOSPITAL_COMMUNITY): Payer: Medicaid Other

## 2021-04-21 ENCOUNTER — Other Ambulatory Visit: Payer: Self-pay

## 2021-04-21 DIAGNOSIS — Z87891 Personal history of nicotine dependence: Secondary | ICD-10-CM | POA: Insufficient documentation

## 2021-04-21 DIAGNOSIS — Z7982 Long term (current) use of aspirin: Secondary | ICD-10-CM | POA: Insufficient documentation

## 2021-04-21 DIAGNOSIS — M545 Low back pain, unspecified: Secondary | ICD-10-CM | POA: Diagnosis not present

## 2021-04-21 DIAGNOSIS — I10 Essential (primary) hypertension: Secondary | ICD-10-CM | POA: Diagnosis not present

## 2021-04-21 DIAGNOSIS — M549 Dorsalgia, unspecified: Secondary | ICD-10-CM | POA: Diagnosis not present

## 2021-04-21 DIAGNOSIS — Z95828 Presence of other vascular implants and grafts: Secondary | ICD-10-CM | POA: Diagnosis not present

## 2021-04-21 DIAGNOSIS — M25511 Pain in right shoulder: Secondary | ICD-10-CM | POA: Diagnosis not present

## 2021-04-21 DIAGNOSIS — Z951 Presence of aortocoronary bypass graft: Secondary | ICD-10-CM | POA: Diagnosis not present

## 2021-04-21 LAB — COMPREHENSIVE METABOLIC PANEL
ALT: UNDETERMINED U/L (ref 0–44)
AST: 13 U/L — ABNORMAL LOW (ref 15–41)
Albumin: 3.5 g/dL (ref 3.5–5.0)
Alkaline Phosphatase: 72 U/L (ref 38–126)
Anion gap: 9 (ref 5–15)
BUN: 14 mg/dL (ref 8–23)
CO2: 21 mmol/L — ABNORMAL LOW (ref 22–32)
Calcium: 8.7 mg/dL — ABNORMAL LOW (ref 8.9–10.3)
Chloride: 108 mmol/L (ref 98–111)
Creatinine, Ser: 1.1 mg/dL (ref 0.61–1.24)
GFR, Estimated: >60 mL/min (ref >60)
Glucose, Bld: 92 mg/dL (ref 70–99)
Potassium: 4.5 mmol/L (ref 3.5–5.1)
Sodium: 138 mmol/L (ref 135–145)
Total Bilirubin: UNDETERMINED mg/dL (ref 0.3–1.2)
Total Protein: 6.4 g/dL — ABNORMAL LOW (ref 6.5–8.1)

## 2021-04-21 LAB — TROPONIN I (HIGH SENSITIVITY)
Troponin I (High Sensitivity): 7 ng/L (ref <18)
Troponin I (High Sensitivity): 7 ng/L (ref <18)

## 2021-04-21 LAB — CBC WITH DIFFERENTIAL/PLATELET
Abs Immature Granulocytes: 0.01 10*3/uL (ref 0.00–0.07)
Basophils Absolute: 0.1 10*3/uL (ref 0.0–0.1)
Basophils Relative: 1 %
Eosinophils Absolute: 0.5 10*3/uL (ref 0.0–0.5)
Eosinophils Relative: 7 %
HCT: 46.9 % (ref 39.0–52.0)
Hemoglobin: 14.6 g/dL (ref 13.0–17.0)
Immature Granulocytes: 0 %
Lymphocytes Relative: 29 %
Lymphs Abs: 2.2 10*3/uL (ref 0.7–4.0)
MCH: 27.4 pg (ref 26.0–34.0)
MCHC: 31.1 g/dL (ref 30.0–36.0)
MCV: 88 fL (ref 80.0–100.0)
Monocytes Absolute: 0.7 10*3/uL (ref 0.1–1.0)
Monocytes Relative: 9 %
Neutro Abs: 4.3 10*3/uL (ref 1.7–7.7)
Neutrophils Relative %: 54 %
Platelets: 304 10*3/uL (ref 150–400)
RBC: 5.33 MIL/uL (ref 4.22–5.81)
RDW: 14.2 % (ref 11.5–15.5)
WBC: 7.9 10*3/uL (ref 4.0–10.5)
nRBC: 0 % (ref 0.0–0.2)

## 2021-04-21 LAB — URINALYSIS, ROUTINE W REFLEX MICROSCOPIC
Bilirubin Urine: NEGATIVE
Glucose, UA: NEGATIVE mg/dL
Hgb urine dipstick: NEGATIVE
Ketones, ur: NEGATIVE mg/dL
Leukocytes,Ua: NEGATIVE
Nitrite: NEGATIVE
Protein, ur: NEGATIVE mg/dL
Specific Gravity, Urine: 1.025 (ref 1.005–1.030)
pH: 6 (ref 5.0–8.0)

## 2021-04-21 MED ORDER — HYDROCODONE-ACETAMINOPHEN 5-325 MG PO TABS
1.0000 | ORAL_TABLET | Freq: Once | ORAL | Status: AC
  Administered 2021-04-21: 21:00:00 1 via ORAL
  Filled 2021-04-21: qty 1

## 2021-04-21 MED ORDER — GABAPENTIN 600 MG PO TABS: 600.0000 mg | ORAL_TABLET | Freq: Three times a day (TID) | ORAL | 0 refills | Status: DC

## 2021-04-21 MED ORDER — GABAPENTIN 300 MG PO CAPS
300.0000 mg | ORAL_CAPSULE | Freq: Once | ORAL | Status: AC
  Administered 2021-04-21: 21:00:00 300 mg via ORAL
  Filled 2021-04-21: qty 1

## 2021-04-21 MED ORDER — HYDROCODONE-ACETAMINOPHEN 5-325 MG PO TABS: 1.0000 | ORAL_TABLET | Freq: Four times a day (QID) | ORAL | 0 refills | Status: DC | PRN

## 2021-04-21 MED ORDER — LIDOCAINE 5 % EX PTCH
1.0000 | MEDICATED_PATCH | Freq: Once | CUTANEOUS | Status: DC
  Administered 2021-04-21: 21:00:00 1 via TRANSDERMAL
  Filled 2021-04-21: qty 1

## 2021-04-21 NOTE — ED Provider Notes (Signed)
Emergency Medicine Provider Triage Evaluation Note  Victor [name] , a 65 y.o. male  was evaluated in triage.  Pt complains of upper mid/right back pain for the past 3 days. Additionally mentions urinary problems. The patient reports that he feels that he has to urinate, but urinates on himself. He reports sometimes it is a stream, and sometimes it's a dribble. Denies fecal incontinence.   Review of Systems  Positive: Back pain Negative: Nausea, vomiting, chest pain, SOB, fever, worsening weakness.   Physical Exam  BP (!) 180/94 (BP Location: Right Arm)    Pulse 88    Temp 98.3 F (36.8 C) (Oral)    Resp 14    SpO2 100%  Gen:   Awake, no distress   Resp:  Normal effort  MSK:   Right leg is stronger than his left which the patient reports is baseline for him. He has not noticed any increased in weakness. Tenderness to the border of his right scapula. Other:    Medical Decision Making  Medically screening exam initiated at 10:07 AM.  Appropriate orders placed.  [name] was informed that the remainder of the evaluation will be completed by another provider, this initial triage assessment does not replace that evaluation, and the importance of remaining in the ED until their evaluation is complete.  Labs ordered.   Achille Rich, PA-C 04/21/21 2255    Pollyann Savoy, MD 04/22/21 [phone]

## 2021-04-21 NOTE — ED Provider Notes (Signed)
[facility] EMERGENCY DEPARTMENT Provider Note   CSN: [id number] Arrival date & time: 04/21/21  0945     History Chief Complaint  Patient presents with   Back Pain    Victor [name] is a 65 y.o. male.  Victor [name] is a 65 y.o. male with history of stroke, spinal cord infarction, thoracic aortic aneurysm s/p repair, hypertension, CAD, who presents to the emergency department for evaluation of back pain.  Patient reports he has been experiencing pain in his right mid to low back for over a year.  He reports he is not seen anyone for this pain but this morning when he got up pain was worse than usual which prompted his ED visit.  He reports throughout the day pain seems to have returned to baseline.  He denies any numbness weakness or tingling in his extremities.  No saddle anesthesia.  He has chronic issues with incontinence that has been ongoing for several months but no new issues with loss of bowel or bladder control.  No associated fevers or chills.  He denies any chest pain, shortness of breath.  No abdominal pain.  Denies any dysuria or urinary frequency.  He reports over the past year he has never followed up with anyone regarding this back pain for further evaluation but is working on establishing care with a PCP.  He reports that he is not prescribed any pain medication but sometimes gets Percocet to help with the pain.  Reports that his neurologist does prescribe him gabapentin which sometimes helps but he is run out of this medication.  The history is provided by the patient and medical records.      Past Medical History:  Diagnosis Date   History of kidney stones    Hypertension    Spinal cord infarction ([place]) 02/2017   Stroke [facility])    Thoracic aortic aneurysm 09/2014    Patient Active Problem List   Diagnosis Date Noted   Unilateral inguinal hernia without obstruction or gangrene 06/29/2017   Nerve pain 05/18/2017   Spinal cord infarction ([place])  03/15/2017   Paraplegia ([place]) 03/07/2017   Bilateral leg weakness 03/07/2017   S/P thoracic aortic aneurysm repair 01/21/2017   Coronary artery calcification 12/23/2016   S/P aortic dissection repair 12/23/2016   Thoracic aortic aneurysm without rupture 12/23/2016   Preoperative cardiovascular examination 12/23/2016   Essential hypertension 09/23/2015   Iron deficiency anemia 01/28/2015   Weight loss 10/30/2014   Hospital-acquired pneumonia 10/09/2014   Ischemic leg 10/09/2014   Aortic dissection ([place]) 10/07/2014    Past Surgical History:  Procedure Laterality Date   ASCENDING AORTIC ROOT REPLACEMENT N/A 01/21/2017   Procedure: Replacement of Aortic Arch with circulatory arrest;  Surgeon: Grace Isaac, MD;  Location: [place];  Service: Open Heart Surgery;  Laterality: N/A;   CARDIAC CATHETERIZATION     FEMORAL-FEMORAL BYPASS GRAFT N/A 10/07/2014   Procedure: LEFT  FEMORAL ARTERY -RIGHT FEMORAL ARTERY BYPASS GRAFT USING 8MM X 30 CM HEMASHIELD GOLD GRAFT;  Surgeon: Rosetta Posner, MD;  Location: [place];  Service: Vascular;  Laterality: N/A;   RIGHT/LEFT HEART CATH AND CORONARY ANGIOGRAPHY N/A 01/11/2017   Procedure: RIGHT/LEFT HEART CATH AND CORONARY ANGIOGRAPHY- Diagnostic Only;  Surgeon: Sherren Mocha, MD;  Location: [place] CV LAB;  Service: Cardiovascular;  Laterality: N/A;   s/p thoracic aneurysm repair     STERNOTOMY N/A 01/21/2017   Procedure: REDO STERNOTOMY;  Surgeon: Grace Isaac, MD;  Location: [place];  Service: Open Heart Surgery;  Laterality: N/A;   TEE WITHOUT CARDIOVERSION N/A 01/21/2017   Procedure: TRANSESOPHAGEAL ECHOCARDIOGRAM (TEE);  Surgeon: [name], MD;  Location: [facility] OR;  Service: Open Heart Surgery;  Laterality: N/A;   THORACIC AORTIC ANEURYSM REPAIR N/A 10/06/2014   Procedure: THORACIC ASCENDING ANEURYSM REPAIR (AAA);  Surgeon: [name], MD;  Location: [facility] OR;  Service: Open Heart Surgery;  Laterality: N/A;  hyportermia circulatory arrest  and resuspension of aortic valve   THORACIC AORTIC ENDOVASCULAR STENT GRAFT N/A 01/21/2017   Procedure: THORACIC AORTIC ENDOVASCULAR STENT GRAFT;  Surgeon: Nada Libman, MD;  Location: [facility] OR;  Service: Vascular;  Laterality: N/A;   THROMBECTOMY ILIAC ARTERY Left 03/08/2017   Procedure: THROMBECTOMY of Left subclavian artery;  Surgeon: Chuck Hint, MD;  Location: [facility] OR;  Service: Vascular;  Laterality: Left;   VASCULAR SURGERY         Family History  Problem Relation Age of Onset   Heart murmur Mother     Social History   Tobacco Use   Smoking status: Former    Years: 35.00    Types: Cigarettes, Cigars    Quit date: 10/07/2014    Years since quitting: 6.5   Smokeless tobacco: Never  Vaping Use   Vaping Use: Never used  Substance Use Topics   Alcohol use: No    Alcohol/week: 0.0 standard drinks   Drug use: No    Home Medications Prior to Admission medications   Medication Sig Start Date End Date Taking? Authorizing Provider  aspirin 325 MG tablet Take 0.5 tablets (162.5 mg total) by mouth daily. Patient taking differently: Take 325 mg by mouth daily. 09/06/18  Yes McCue, Shanda Bumps, NP  HYDROcodone-acetaminophen (NORCO) 5-325 MG tablet Take 1 tablet by mouth every 6 (six) hours as needed. 04/21/21  Yes Dartha Lodge, PA-C  gabapentin (NEURONTIN) 600 MG tablet Take 1 tablet (600 mg total) by mouth 3 (three) times daily. 04/21/21 05/21/21  Dartha Lodge, PA-C    Allergies    Patient has no known allergies.  Review of Systems   Review of Systems  Constitutional:  Negative for chills and fever.  HENT: Negative.    Respiratory:  Negative for shortness of breath.   Cardiovascular:  Negative for chest pain.  Gastrointestinal:  Negative for abdominal pain, constipation, diarrhea, nausea and vomiting.  Genitourinary:  Negative for dysuria, flank pain, frequency and hematuria.  Musculoskeletal:  Positive for back pain. Negative for arthralgias, gait problem, joint  swelling, myalgias and neck pain.  Skin:  Negative for color change, rash and wound.  Neurological:  Negative for weakness and numbness.  All other systems reviewed and are negative.  Physical Exam Updated Vital Signs BP (!) 147/76    Pulse 92    Temp 98.3 F (36.8 C) (Oral)    Resp 16    SpO2 99%   Physical Exam Vitals and nursing note reviewed.  Constitutional:      General: He is not in acute distress.    Appearance: He is well-developed. He is not diaphoretic.  HENT:     Head: Atraumatic.  Eyes:     General:        Right eye: No discharge.        Left eye: No discharge.  Cardiovascular:     Pulses:          Radial pulses are 2+ on the right side and 2+ on the left side.  Dorsalis pedis pulses are 2+ on the right side and 2+ on the left side.       Posterior tibial pulses are 2+ on the right side and 2+ on the left side.  Pulmonary:     Effort: Pulmonary effort is normal. No respiratory distress.  Abdominal:     General: Bowel sounds are normal. There is no distension.     Palpations: Abdomen is soft. There is no mass.     Tenderness: There is no abdominal tenderness. There is no guarding.     Comments: Abdomen soft, nondistended, nontender to palpation in all quadrants without guarding or peritoneal signs, no CVA tenderness bilaterally  Musculoskeletal:        General: Tenderness present.     Cervical back: Neck supple.     Comments: Tenderness to palpation over right mid to low back musculature, no midline tenderness.  Pain made worse with range of motion of the lower extremities, neg straight leg raise  Skin:    General: Skin is warm and dry.     Capillary Refill: Capillary refill takes less than 2 seconds.  Neurological:     Mental Status: He is alert and oriented to person, place, and time.     Comments: Alert, clear speech, following commands. Moving all extremities without difficulty. 5/5 strength in the right lower extremity.  4+/5 strength in the left  lower extremity which is chronic and unchanged for patient. Sensation intact in bilateral lower extremities. Ambulatory with steady gait  Psychiatric:        Behavior: Behavior normal.    ED Results / Procedures / Treatments   Labs (all labs ordered are listed, but only abnormal results are displayed) Labs Reviewed  COMPREHENSIVE METABOLIC PANEL - Abnormal; Notable for the following components:      Result Value   CO2 21 (*)    Calcium 8.7 (*)    Total Protein 6.4 (*)    AST 13 (*)    All other components within normal limits  URINALYSIS, ROUTINE W REFLEX MICROSCOPIC  CBC WITH DIFFERENTIAL/PLATELET  TROPONIN I (HIGH SENSITIVITY)  TROPONIN I (HIGH SENSITIVITY)    EKG None  Radiology DG Chest 2 View  Result Date: 04/21/2021 CLINICAL DATA:  back pain x 1 year, R shoulder pain x 3 days, and urinary incontinence x 3 months. Hx of back surgery and extensive cardiac hx with CABG x 2, and repaired AAA EXAM: CHEST - 2 VIEW COMPARISON:  04/27/2018 FINDINGS: Lungs are clear. Stable distal arch and descending thoracic aortic stent graft. Heart size and mediastinal contour normal. No effusion.  No pneumothorax. Sternotomy wires. IMPRESSION: No acute cardiopulmonary disease.  Stable stent graft. Electronically Signed   By: Lucrezia Europe M.D.   On: 04/21/2021 10:19    Procedures Procedures   Medications Ordered in ED Medications  lidocaine (LIDODERM) 5 % 1 patch (1 patch Transdermal Patch Applied 04/21/21 2045)  gabapentin (NEURONTIN) capsule 300 mg (300 mg Oral Given 04/21/21 2042)  HYDROcodone-acetaminophen (NORCO/VICODIN) 5-325 MG per tablet 1 tablet (1 tablet Oral Given 04/21/21 2042)    ED Course  I have reviewed the triage vital signs and the nursing notes.  Pertinent labs & imaging results that were available during my care of the patient were reviewed by me and considered in my medical decision making (see chart for details).    MDM Rules/Calculators/A&P  65 year old male presents for right low back pain.  This pain has been present for over a year, he denies any changes or injury.  Pain is reproducible with palpation over the right mid to low back musculature, no midline tenderness, no neurologic deficits or red flag symptoms.  Patient has not followed up with anyone for this pain over the past year, came in today because pain was a bit worse than usual this morning although has improved throughout the day.  He received chest pain work-up from triage and does have extensive cardiac history but denies any associated chest pain.  Work-up was reassuring with normal troponins, and labs without significant derangement.  Urinalysis unremarkable as well.  Pain treated here in the emergency department, patient also reports that he has previously taken gabapentin before which is helped but is run out of this.  I have prescribed patient his gabapentin and encouraged use of Lidoderm patches as well as a short-term use of hydrocodone as he is not a good candidate for NSAIDs given his significant cardiac history.  Stressed the importance of follow-up as an outpatient for long-term management of this back pain and provided return precautions.  Discharged home in good condition.   Final Clinical Impression(s) / ED Diagnoses Final diagnoses:  Acute right-sided low back pain without sciatica    Rx / DC Orders ED Discharge Orders          Ordered    gabapentin (NEURONTIN) 600 MG tablet  3 times daily        04/21/21 2120    HYDROcodone-acetaminophen (NORCO) 5-325 MG tablet  Every 6 hours PRN        04/21/21 2120             Janet Berlin 04/21/21 2141    Noemi Chapel, MD 04/23/21 0000

## 2021-04-21 NOTE — Discharge Instructions (Addendum)
HOME INSTRUCTIONS Self - care:  The application of heat can help soothe the pain.  Maintaining your daily activities, including walking (this is encouraged), as it will help you get better faster than just staying in bed. Do not life, push, pull anything more than 10 pounds for the next week. I am attaching back exercises that you can do at home to help facilitate your recovery.   Back Exercises - I have attached a handout on back exercises that can be done at home to help facilitate your recovery.   Medications are also useful to help with pain control.   Acetaminophen.  This medication is generally safe, and found over the counter. Take as directed for your age. You should not take more than 8 of the extra strength (500mg ) pills a day (max dose is 4000mg  total OVER one day)  Lidocaine Patch: Salon Pas lidocaine patches (blue and silver box) can be purchased over the counter and worn for 12 hours for local pain relief   Continue using gabapentin as prescribed. For severe pain you can use prescribed hydrocodone as needed, use caution when taking this medication, can cause drowsiness.   You will need to follow up with your primary healthcare provider in 1-2 weeks for reassessment and persistent symptoms.  Be aware that if you develop new symptoms, such as a fever, leg weakness, difficulty with or loss of control of your urine or bowels, abdominal pain, or more severe pain, you will need to seek medical attention and/or return to the Emergency department.  Additional Information:  Your vital signs today were: BP (!) 146/92    Pulse 92    Temp 98.7 F (37.1 C) (Oral)    Resp 18    SpO2 100%  If your blood pressure (BP) was elevated above 135/85 this visit, please have this repeated by your doctor within one month. ---------------

## 2021-04-21 NOTE — ED Triage Notes (Signed)
Pt reports back pain x 1 year, R shoulder pain x 3 days, and urinary incontinence x 3 months. Hx of back surgery and extensive cardiac hx with CABG x 2, and repaired AAA.

## 2021-04-22 ENCOUNTER — Telehealth: Payer: Self-pay

## 2021-04-22 NOTE — Telephone Encounter (Signed)
Transition Care Management Unsuccessful Follow-up Telephone Call ° °Date of discharge and from where:  04/21/2021 from [place] ° °Attempts:  1st Attempt ° °Reason for unsuccessful TCM follow-up call:  Left voice message ° ° ° °

## 2021-04-23 NOTE — Telephone Encounter (Signed)
Transition Care Management Unsuccessful Follow-up Telephone Call  Date of discharge and from where:  04/21/2021 from [facility]  Attempts:  2nd Attempt  Reason for unsuccessful TCM follow-up call:  Left voice message

## 2021-04-24 NOTE — Telephone Encounter (Signed)
Transition Care Management Unsuccessful Follow-up Telephone Call  Date of discharge and from where:  04/21/2021 from [facility]  Attempts:  3rd Attempt  Reason for unsuccessful TCM follow-up call:  Unable to reach patient

## 2021-07-02 ENCOUNTER — Encounter: Payer: Self-pay | Admitting: Emergency Medicine

## 2021-07-02 ENCOUNTER — Other Ambulatory Visit: Payer: Self-pay

## 2021-07-02 ENCOUNTER — Ambulatory Visit (INDEPENDENT_AMBULATORY_CARE_PROVIDER_SITE_OTHER): Payer: Medicaid Other | Admitting: Emergency Medicine

## 2021-07-02 VITALS — BP 136/68 | HR 92 | Ht 67.0 in | Wt 162.0 lb

## 2021-07-02 DIAGNOSIS — R29898 Other symptoms and signs involving the musculoskeletal system: Secondary | ICD-10-CM | POA: Diagnosis not present

## 2021-07-02 DIAGNOSIS — Z9889 Other specified postprocedural states: Secondary | ICD-10-CM | POA: Diagnosis not present

## 2021-07-02 DIAGNOSIS — G894 Chronic pain syndrome: Secondary | ICD-10-CM | POA: Diagnosis not present

## 2021-07-02 DIAGNOSIS — Z8673 Personal history of transient ischemic attack (TIA), and cerebral infarction without residual deficits: Secondary | ICD-10-CM

## 2021-07-02 DIAGNOSIS — I10 Essential (primary) hypertension: Secondary | ICD-10-CM

## 2021-07-02 DIAGNOSIS — Z8679 Personal history of other diseases of the circulatory system: Secondary | ICD-10-CM

## 2021-07-02 NOTE — Progress Notes (Signed)
Victor [name] 66 y.o.   Chief Complaint  Patient presents with   New Patient (Initial Visit)    Chronic back and leg pain, hx of stroke and heart surgery. Pt states he is depresse    HISTORY OF PRESENT ILLNESS: This is a 66 y.o. male for first visit to this office, wants to establish care. Looking for PCP that can handle his chronic pain.  Many years history of chronic leg and back pain. Has history of aortic dissection surgery, history of stroke, history of hypertension. No other complaints or medical concerns today.  HPI   Prior to Admission medications   Medication Sig Start Date End Date Taking? Authorizing Provider  aspirin 325 MG tablet Take 0.5 tablets (162.5 mg total) by mouth daily. Patient taking differently: Take 325 mg by mouth daily. 09/06/18  Yes McCue, Shanda BumpsJessica, NP  gabapentin (NEURONTIN) 600 MG tablet Take 1 tablet (600 mg total) by mouth 3 (three) times daily. 04/21/21 07/02/21 Yes Dartha LodgeFord, Kelsey N, PA-C  HYDROcodone-acetaminophen (NORCO) 5-325 MG tablet Take 1 tablet by mouth every 6 (six) hours as needed. 04/21/21  Yes Dartha LodgeFord, Kelsey N, PA-C    No Known Allergies  Patient Active Problem List   Diagnosis Date Noted   Unilateral inguinal hernia without obstruction or gangrene 06/29/2017   Spinal cord infarction (HCC) 03/15/2017   Paraplegia (HCC) 03/07/2017   Bilateral leg weakness 03/07/2017   S/P thoracic aortic aneurysm repair 01/21/2017   Coronary artery calcification 12/23/2016   S/P aortic dissection repair 12/23/2016   Thoracic aortic aneurysm without rupture 12/23/2016   Essential hypertension 09/23/2015   Iron deficiency anemia 01/28/2015   Hospital-acquired pneumonia 10/09/2014   Aortic dissection (HCC) 10/07/2014    Past Medical History:  Diagnosis Date   History of kidney stones    Hypertension    Spinal cord infarction (HCC) 02/2017   Stroke [facility](HCC)    Thoracic aortic aneurysm 09/2014    Past Surgical History:  Procedure Laterality Date    ASCENDING AORTIC ROOT REPLACEMENT N/A 01/21/2017   Procedure: Replacement of Aortic Arch with circulatory arrest;  Surgeon: Delight OvensGerhardt, Edward B, MD;  [facility];  Service: Open Heart Surgery;  Laterality: N/A;   CARDIAC CATHETERIZATION     FEMORAL-FEMORAL BYPASS GRAFT N/A 10/07/2014   Procedure: LEFT  FEMORAL ARTERY -RIGHT FEMORAL ARTERY BYPASS GRAFT USING 8MM X 30 CM HEMASHIELD GOLD GRAFT;  Surgeon: Larina Earthlyodd F Early, MD;  [facility];  Service: Vascular;  Laterality: N/A;   RIGHT/LEFT HEART CATH AND CORONARY ANGIOGRAPHY N/A 01/11/2017   Procedure: RIGHT/LEFT HEART CATH AND CORONARY ANGIOGRAPHY- Diagnostic Only;  Surgeon: Tonny Bollmanooper, Michael, MD;  [facility] LAB;  Service: Cardiovascular;  Laterality: N/A;   s/p thoracic aneurysm repair     STERNOTOMY N/A 01/21/2017   Procedure: REDO STERNOTOMY;  Surgeon: Delight OvensGerhardt, Edward B, MD;  [facility];  Service: Open Heart Surgery;  Laterality: N/A;   TEE WITHOUT CARDIOVERSION N/A 01/21/2017   Procedure: TRANSESOPHAGEAL ECHOCARDIOGRAM (TEE);  Surgeon: Delight OvensGerhardt, Edward B, MD;  [facility];  Service: Open Heart Surgery;  Laterality: N/A;   THORACIC AORTIC ANEURYSM REPAIR N/A 10/06/2014   Procedure: THORACIC ASCENDING ANEURYSM REPAIR (AAA);  Surgeon: Delight OvensEdward B Gerhardt, MD;  [facility];  Service: Open Heart Surgery;  Laterality: N/A;  hyportermia circulatory arrest and resuspension of aortic valve   THORACIC AORTIC ENDOVASCULAR STENT GRAFT N/A 01/21/2017   Procedure: THORACIC AORTIC ENDOVASCULAR STENT GRAFT;  Surgeon: Nada LibmanBrabham, Vance W, MD;  Location: [facility] OR;  Service: Vascular;  Laterality: N/A;   THROMBECTOMY ILIAC ARTERY Left 03/08/2017   Procedure: THROMBECTOMY of Left subclavian artery;  Surgeon: Chuck Hint, MD;  Location: [facility] OR;  Service: Vascular;  Laterality: Left;   VASCULAR SURGERY      Social History   Socioeconomic History   Marital status: Single    Spouse name: Not on file   Number of children: Not on file    Years of education: Not on file   Highest education level: Not on file  Occupational History   Not on file  Tobacco Use   Smoking status: Former    Years: 35.00    Types: Cigarettes, Cigars    Quit date: 10/07/2014    Years since quitting: 6.7   Smokeless tobacco: Never  Vaping Use   Vaping Use: Never used  Substance and Sexual Activity   Alcohol use: No    Alcohol/week: 0.0 standard drinks   Drug use: No   Sexual activity: Not on file  Other Topics Concern   Not on file  Social History Narrative   Not on file   Social Determinants of Health   Financial Resource Strain: Not on file  Food Insecurity: Not on file  Transportation Needs: Not on file  Physical Activity: Not on file  Stress: Not on file  Social Connections: Not on file  Intimate Partner Violence: Not on file    Family History  Problem Relation Age of Onset   Heart murmur Mother      Review of Systems  Constitutional: Negative.  Negative for chills and fever.  HENT:  Negative for congestion and sore throat.   Respiratory: Negative.  Negative for cough and shortness of breath.   Cardiovascular:  Negative for chest pain and palpitations.  Gastrointestinal:  Negative for abdominal pain, diarrhea, nausea and vomiting.  Genitourinary: Negative.   Musculoskeletal:  Positive for back pain.  Skin: Negative.  Negative for rash.  Neurological:  Negative for dizziness and headaches.  All other systems reviewed and are negative.  Today's Vitals   07/02/21 1121  BP: 136/68  Pulse: 92  SpO2: 96%  Weight: 162 lb (73.5 kg)  Height:  (1.702 m)   Body mass index is 25.37 kg/m.  Physical Exam Vitals reviewed.  Constitutional:      Appearance: Normal appearance.     Comments: Ambulates with walker.  States he still drives.  HENT:     Head: Normocephalic.     Mouth/Throat:     Mouth: Mucous membranes are moist.     Pharynx: Oropharynx is clear.  Eyes:     Extraocular Movements: Extraocular movements  intact.     Pupils: Pupils are equal, round, and reactive to light.  Cardiovascular:     Rate and Rhythm: Normal rate and regular rhythm.     Heart sounds: Murmur heard.  Pulmonary:     Effort: Pulmonary effort is normal.     Breath sounds: Normal breath sounds.  Genitourinary:    Comments: Visible large scrotal hernia Skin:    General: Skin is warm and dry.  Neurological:     Mental Status: He is alert and oriented to person, place, and time. Mental status is at baseline.  Psychiatric:        Mood and Affect: Mood normal.        Behavior: Behavior normal.    ASSESSMENT & PLAN: Clinically stable.  Normotensive. Patient's main concern and chief complaint is chronic pain to back and legs.  I explained to patient I do not feel comfortable managing chronic pain. Referral placed to pain management clinic. Patient also advised to consider establishing care with primary care provider who feels comfortable managing chronic pain. He voices understanding and does not have any further questions.  A total of 45 minutes was spent with the patient and counseling/coordination of care regarding preparing for this visit, review of most recent office visit with neurologist, review of all medications, review of multiple chronic medical conditions and their management, diagnosis of chronic pain, management, and need for referral to pain management clinic, prognosis, documentation and need for follow-up.   Problem List Items Addressed This Visit       Cardiovascular and Mediastinum   Essential hypertension   History of aortic dissection     Nervous and Auditory   Bilateral leg weakness     Other   S/P thoracic aortic aneurysm repair   Chronic pain syndrome - Primary   Relevant Orders   Ambulatory referral to Pain Clinic   History of stroke   Patient Instructions  Chronic Pain, Adult Chronic pain is a type of pain that lasts or keeps coming back for at least 3-6 months. You may have  headaches, pain in the abdomen, or pain in other areas of the body. Chronic pain may be related to an illness, such as fibromyalgia or complex regional pain syndrome. Chronic pain may also be related to an injury or a health condition. Sometimes, the cause of chronic pain is not known. Chronic pain can make it hard for you to do daily activities. If not treated, chronic pain can lead to anxiety and depression. Treatment depends on the cause and severity of your pain. You may need to work with a pain specialist to come up with a treatment plan. The plan may include medicine, counseling, and physical therapy. Many people benefit from a combination of two or more types of treatment to control their pain. Follow these instructions at home: Medicines Take over-the-counter and prescription medicines only as told by your health care provider. Ask your health care provider if the medicine prescribed to you: Requires you to avoid driving or using machinery. Can cause constipation. You may need to take these actions to prevent or treat constipation: Drink enough fluid to keep your urine pale yellow. Take over-the-counter or prescription medicines. Eat foods that are high in fiber, such as beans, whole grains, and fresh fruits and vegetables. Limit foods that are high in fat and processed sugars, such as fried or sweet foods. Treatment plan Follow your treatment plan as told by your health care provider. This may include: Gentle, regular exercise. Eating a healthy diet that includes foods such as vegetables, fruits, fish, and lean meats. Cognitive or behavioral therapy that changes the way you think or act in response to the pain. This may help improve how you feel. Working with a physical therapist. Meditation, yoga, acupuncture, or massage therapy. Aroma, color, light, or sound therapy. Local electrical stimulation. The electrical pulses help to relieve pain by temporarily stopping the nerve impulses that  cause you to feel pain. Injections. These deliver numbing or pain-relieving medicines into the spine or the area of pain.  Lifestyle  Ask your health care provider whether you should keep a pain diary. Your health care provider will tell you what information to write in the diary. This may include when you have pain, what the pain feels like, and how medicines and other behaviors or treatments help to reduce  the pain. Consider talking with a mental health care provider about how to manage chronic pain. Consider joining a chronic pain support group. Try to control or lower your stress levels. Talk with your health care provider about ways to do this. General instructions Learn as much as you can about how to manage your chronic pain. Ask your health care provider if an intensive pain rehabilitation program or a chronic pain specialist would be helpful. Check your pain level as told by your health care provider. Ask your health care provider if you should use a pain scale. It is up to you to get the results of any tests that were done. Ask your health care provider, or the department that is doing the tests, when your results will be ready. Keep all follow-up visits as told by your health care provider. This is important. Contact a health care provider if: Your pain gets worse, or you have new pain. You have trouble sleeping. You have trouble doing your normal activities. Your pain is not controlled with treatment. You have side effects from pain medicine. You feel weak. You notice any other changes that show that your condition is getting worse. Get help right away if: You lose feeling or have numbness in your body. You lose control of bowel or bladder function. Your pain suddenly gets much worse. You develop shaking or chills. You develop confusion. You develop chest pain. You have trouble breathing or shortness of breath. You pass out. You have thoughts about hurting yourself or  others. If you ever feel like you may hurt yourself or others, or have thoughts about taking your own life, get help right away. Go to your nearest emergency department or: Call your local emergency services (911 in the [place] Call a suicide crisis helpline, such as the National Suicide Prevention Lifeline at 434-304-3081 or 988 in the [place] This is open 24 hours a day in the [place] Text the Crisis Text Line at 5122516078 (in the [place] Summary Chronic pain is a type of pain that lasts or keeps coming back for at least 3-6 months. Chronic pain may be related to an illness, injury, or other health condition. Sometimes, the cause of chronic pain is not known. Treatment depends on the cause and severity of your pain. Many people benefit from a combination of two or more types of treatment to control their pain. Follow your treatment plan as told by your health care provider. This information is not intended to replace advice given to you by your health care provider. Make sure you discuss any questions you have with your health care provider. Document Revised: 11/05/2020 Document Reviewed: 12/28/2018 Elsevier Patient Education  2022 Elsevier Inc.     Edwina Barth, MD  Primary Care at [facility]

## 2021-07-02 NOTE — Patient Instructions (Signed)

## 2021-08-11 ENCOUNTER — Ambulatory Visit: Payer: Medicaid Other | Admitting: Adult Health

## 2021-08-11 ENCOUNTER — Encounter: Payer: Self-pay | Admitting: Adult Health

## 2021-08-11 NOTE — Progress Notes (Deleted)
[facility] [address] [place]. [place] [address] [phone]       OFFICE FOLLOW-UP NOTE  Victor [name] Date of Birth:  Oct 14, 1955 Medical Record Number:  [id number]    Reason for visit: Spinal cord infarct  No chief complaint on file.     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  He has been routinely followed in this office since his spinal cord infarct.   Update 08/11/2021 JM: Patient returns for 65-month follow-up previously seen 07/2021.  Overall stable since prior visit.  Continued use of gabapentin  which was increased since prior visit to 600 mg 3 times daily (was seen in ED in 03/2021 for right lower back pain), was seen by Dr. Mitchel Honour for initial visit to establish care although patient was looking for PCP to handle pain management, he was referred to pain management and encourage patient to find PCP who was more comfortable with pain management and no further follow-up visits recommended.  He still does not have a PCP.  Continues to use rolling walker, no recent falls.  Compliant on aspirin, denies side effects.  Blood pressure today ***.     Today, 02/03/2021, Victor [name] returns for stroke follow-up.  Previously seen 07/30/2020 - added amitriptyline due to continued dysesthesias and insomnia. No other changes at that time. Reports no benefit with use of amitriptyline although unable to state how long he tried for.  He has remained on gabapentin 300 mg TID with some improvement of dysesthesias initially but continues to experience pain.  Reports continued insomnia.  Continued use of rolling walker -no recent falls.  Compliant on aspirin without side effects.  Blood pressure today 132/86.  He has not yet established care with PCP.  No new concerns at  this time.       ROS:   14 system review of systems is positive for those listed in HPI and the following systems are negative  PMH:  Past Medical History:  Diagnosis Date   History of kidney stones    Hypertension    Spinal cord infarction ([place]) 02/2017   Stroke [facility]    Thoracic aortic aneurysm 09/2014    Social History:  Social History   Socioeconomic History   Marital status: Single    Spouse name: Not on file   Number of children: Not on file   Years of education: Not on file   Highest education level: Not on file  Occupational History   Not on file  Tobacco Use   Smoking status: Former    Years: 35.00    Types: Cigarettes, Cigars    Quit date: 10/07/2014    Years since quitting: 6.8   Smokeless tobacco: Never  Vaping Use   Vaping Use: Never used  Substance and Sexual Activity   Alcohol use: No    Alcohol/week: 0.0 standard drinks   Drug use: No   Sexual activity: Not on file  Other Topics Concern   Not on file  Social History Narrative   Not on file   Social Determinants of Health   Financial Resource Strain: Not on file  Food Insecurity: Not on file  Transportation Needs: Not on file  Physical Activity: Not on file  Stress: Not on file  Social Connections: Not on file  Intimate Partner Violence: Not on file    Medications:   Current Outpatient Medications on File Prior to Visit  Medication Sig Dispense Refill   aspirin 325 MG tablet Take 0.5 tablets (162.5 mg total) by mouth daily. (Patient taking differently: Take 325 mg by mouth daily.) 15 tablet 3   gabapentin (NEURONTIN) 600 MG tablet Take 1 tablet (600 mg total) by mouth 3 (three) times daily. 90 tablet 0   HYDROcodone-acetaminophen (NORCO) 5-325 MG tablet Take 1 tablet by mouth every 6 (six) hours as needed. 10 tablet 0   No current facility-administered medications on file prior to visit.    Allergies:   No Known Allergies    There were no vitals filed for this visit.  There is  no height or weight on file to calculate BMI.  Physical Exam General: Pleasant middle-aged [place] American male, seated, in no evident distress Neck: supple with no carotid or supraclavicular bruits Cardiovascular: regular rate and rhythm, no murmurs Vascular:  Normal pulses all extremities  Neurologic Exam Mental Status: Awake and fully alert.  Fluent speech and language.  Oriented to place and time. Recent and remote memory intact. Attention span, concentration and fund of knowledge appropriate. Mood and affect appropriate.  Cranial Nerves: Pupils equal, briskly reactive to light. Extraocular movements full without nystagmus. Visual fields full to confrontation. Hearing intact. Facial sensation intact. Face, tongue, palate moves normally and symmetrically.  Motor: Full strength bilateral upper extremities; LLE: 4+/5 proximal and 4/5 distal; RLE 4/5 proximal and 3/5 distal.  Full strength upper extremities. Sensory.: intact to touch ,pinprick .position and vibratory sensation.  Coordination: Rapid alternating movements normal in upper extremities. Finger-to-nose performed accurately bilaterally.  Unable to perform heel-to-shin performed accurately bilaterally due to weakness. Gait and Station: Stands from seated position without difficulty.  Patient ambulating with slight decreased stride length and step height and mild imbalance with use of Rollator walker.  Tandem walk and heel toe not attempted. Reflexes: Deep tendon reflexes are brisk throughout.  Toes downgoing.         ASSESSMENT/PLAN:  66 year-old African-American male with bilateral lower extremity weakness and paresthesias secondary to spinal cord infarct in November 2018 following aortic dissection surgery in September 2018.  Residual deficits of lower extremity weakness and dysesthesias   Hx of spinal cord infarct -Residual lower extremity weakness and dysesthesias -increase nighttime dose of gabapentin to 600 mg nightly and  continue a.m. dose 300 mg and afternoon dose 300 mg.  Advised to call after 4 weeks if no benefit -Patient provided with handicap placard -Continue  aspirin 162.5mg   for secondary stroke prevention -Discussed secondary stroke prevention measures and importance of establishing care with PCP for routine follow-up for aggressive stroke risk factor management  -Referral placed (AGAIN) to establish care with PCP -prior referrals quickly closed as unable to reach patient -discussed importance of establishing care with PCP and to ensure he is answering telephone calls.  Advised to let us know if he is not contacted within 1 month    Follow up in 6 months or call earlier if needed    I spent 26 minutes of face-to-face and non-face-to-face time with patient.  This included previsit chart review, lab review, study review, order entry, electronic health record documentation, patient education and discussion regarding history of spinal cord infarct with residual deficits, importance of managing stroke risk factors and stroke prevention measures and answered all other questions to patient satisfaction  Frann Rider, AGNP-BC  [facility] [phone]  [place], [place] [address]  Phone [phone] Fax [phone]

## 2021-08-13 ENCOUNTER — Encounter: Payer: Self-pay | Admitting: Physical Medicine & Rehabilitation

## 2021-11-18 ENCOUNTER — Encounter: Payer: Medicare Other | Attending: Physical Medicine & Rehabilitation | Admitting: Physical Medicine & Rehabilitation

## 2021-12-16 ENCOUNTER — Other Ambulatory Visit: Payer: Self-pay

## 2021-12-16 MED ORDER — GABAPENTIN 600 MG PO TABS: 600.0000 mg | ORAL_TABLET | Freq: Three times a day (TID) | ORAL | 2 refills | Status: DC

## 2021-12-16 NOTE — Telephone Encounter (Signed)
Pt pharmacy [facility] [address] [place], [place], [place] [address] faxed over prescription refill request for Gabapentin 300mg . Refill sent to pharmacy.

## 2021-12-17 ENCOUNTER — Other Ambulatory Visit: Payer: Self-pay | Admitting: *Deleted

## 2021-12-17 MED ORDER — GABAPENTIN 300 MG PO CAPS: ORAL_CAPSULE | ORAL | 5 refills | Status: DC

## 2022-02-17 ENCOUNTER — Ambulatory Visit: Payer: Medicare Other | Admitting: Physical Medicine & Rehabilitation

## 2022-03-10 ENCOUNTER — Encounter: Payer: Medicare Other | Attending: Physical Medicine & Rehabilitation | Admitting: Physical Medicine & Rehabilitation

## 2022-04-03 IMAGING — DX DG CHEST 2V
2 series · 2 of 2 positions shown · non-contrast
Comparison: 04/27/2018

CLINICAL DATA: back pain x 1 year, R shoulder pain x 3 days, and
urinary incontinence x 3 months. Hx of back surgery and extensive
cardiac hx with CABG x 2, and repaired AAA

EXAM:
CHEST - 2 VIEW

[chest lat]
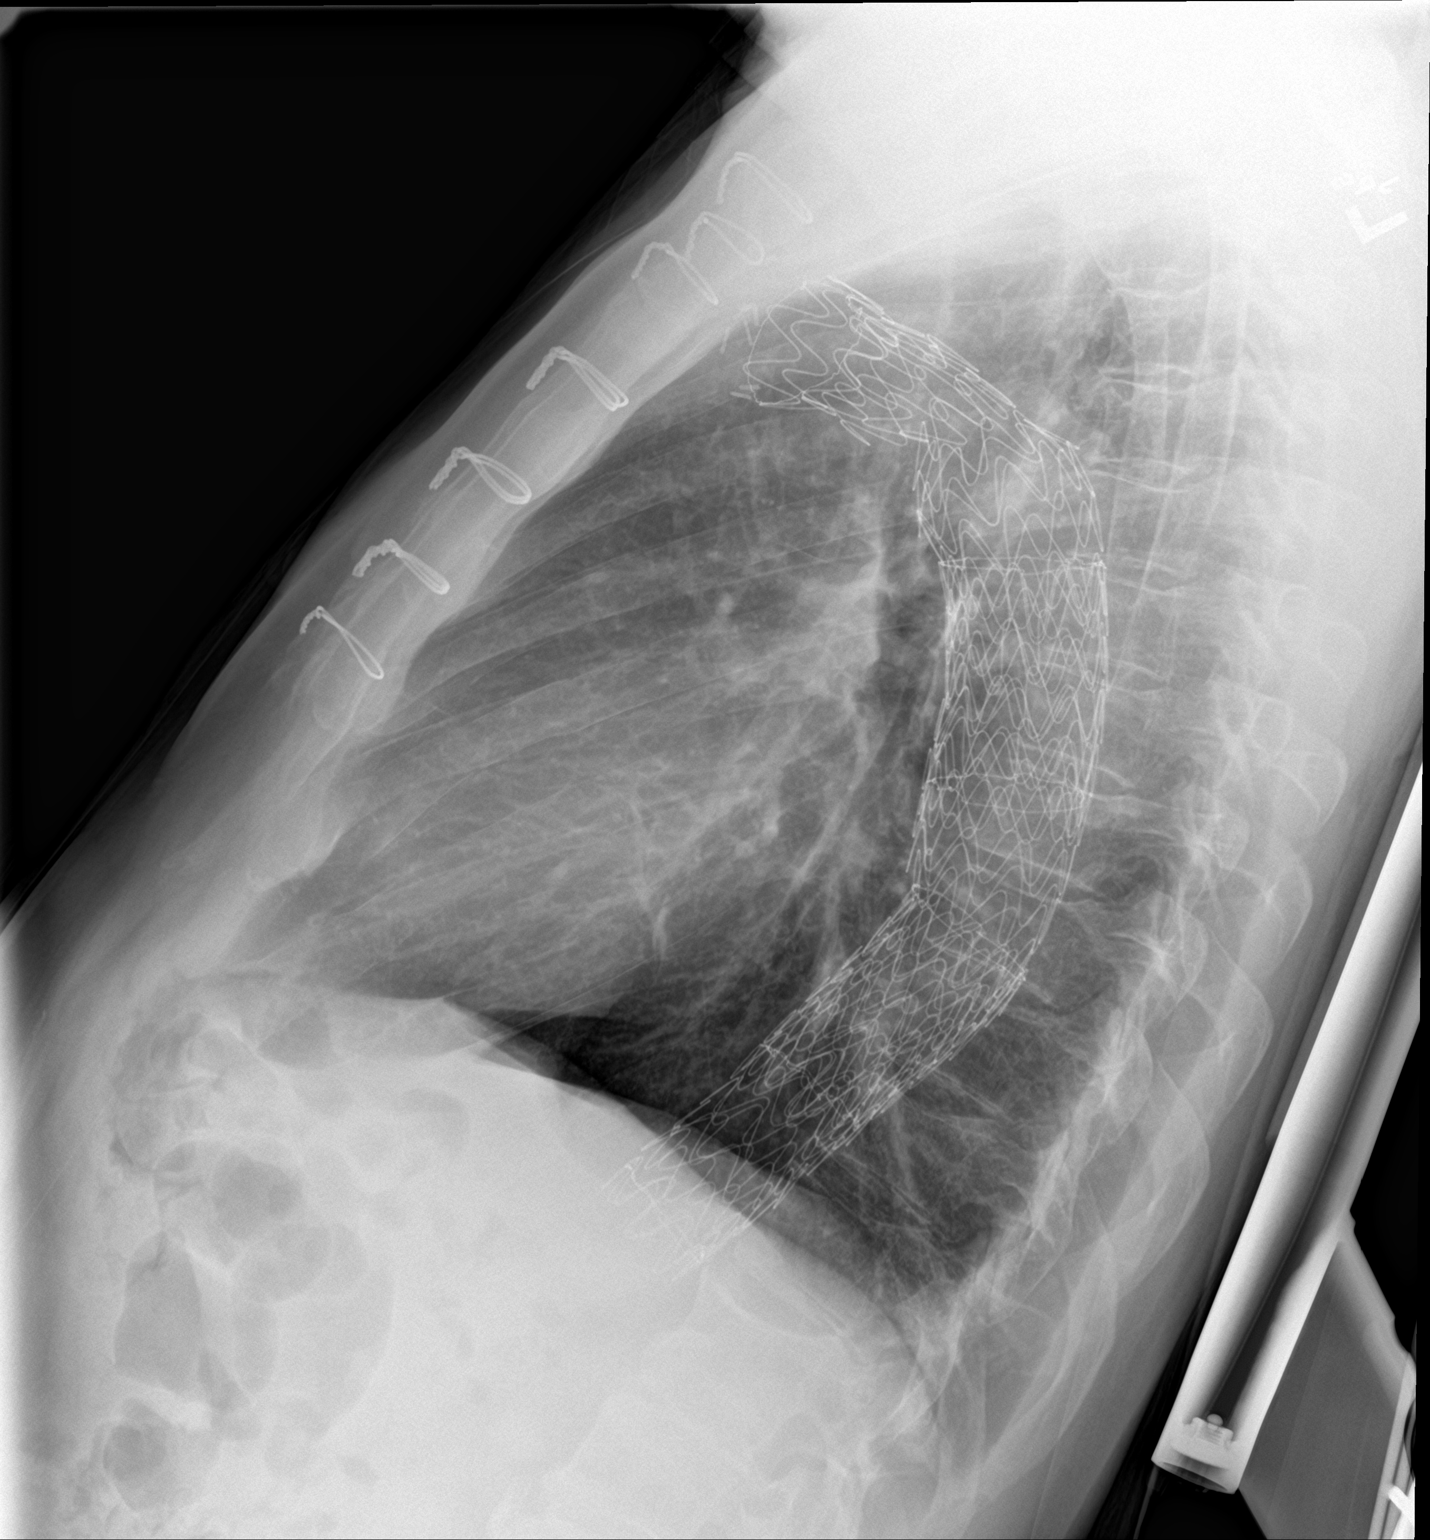

[chest ap]
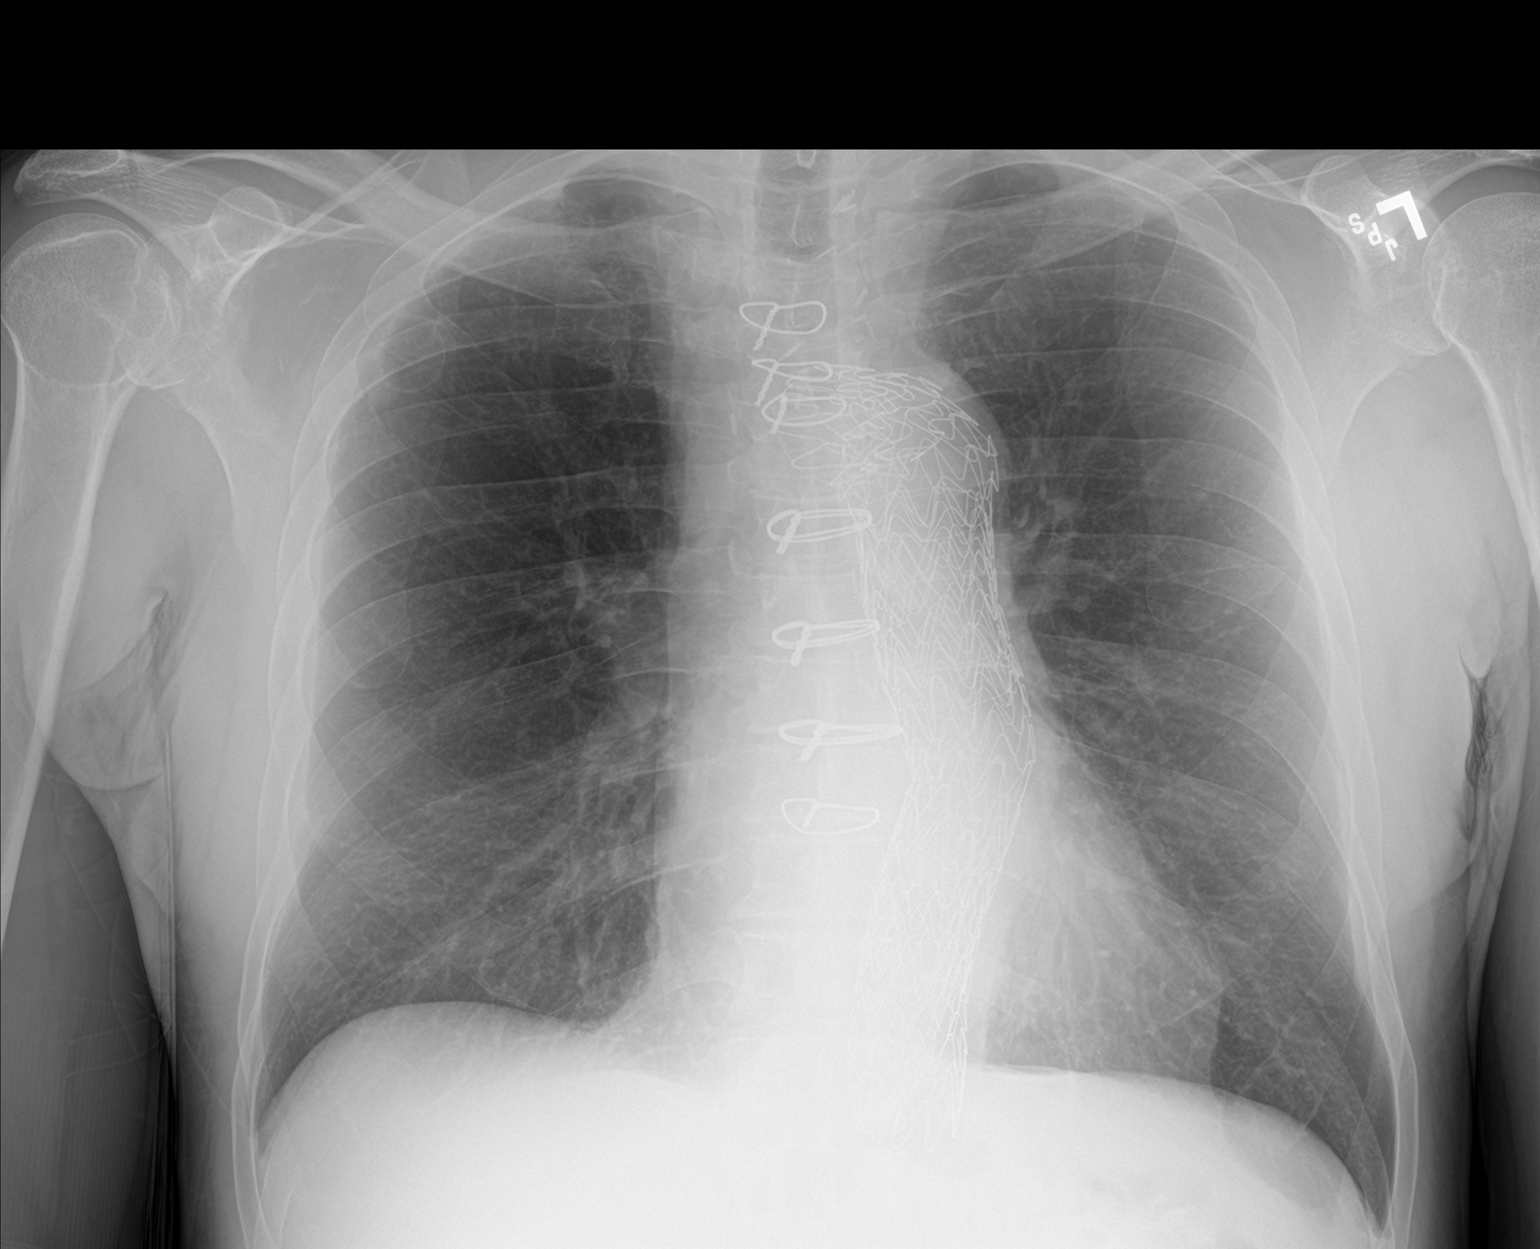

[2 of 2 positions shown; findings below may reference images not displayed]

FINDINGS: Lungs are clear.

Stable distal arch and descending thoracic aortic stent graft. Heart
size and mediastinal contour normal.

No effusion.  No pneumothorax.

Sternotomy wires.
IMPRESSION: No acute cardiopulmonary disease.  Stable stent graft.

## 2022-04-08 DIAGNOSIS — R32 Unspecified urinary incontinence: Secondary | ICD-10-CM | POA: Insufficient documentation

## 2022-04-21 DIAGNOSIS — M545 Low back pain, unspecified: Secondary | ICD-10-CM | POA: Insufficient documentation

## 2022-04-22 ENCOUNTER — Other Ambulatory Visit: Payer: Self-pay

## 2022-04-22 MED ORDER — GABAPENTIN 300 MG PO CAPS: ORAL_CAPSULE | ORAL | 5 refills | Status: DC

## 2022-12-06 ENCOUNTER — Encounter (HOSPITAL_COMMUNITY): Payer: Self-pay

## 2022-12-06 ENCOUNTER — Other Ambulatory Visit: Payer: Self-pay

## 2022-12-06 ENCOUNTER — Emergency Department (HOSPITAL_COMMUNITY)
Admission: EM | Admit: 2022-12-06 | Discharge: 2022-12-07 | Disposition: A | Discharge status: Home / Self Care | Payer: 59 | Attending: Emergency Medicine | Admitting: Emergency Medicine

## 2022-12-06 DIAGNOSIS — M79604 Pain in right leg: Secondary | ICD-10-CM

## 2022-12-06 DIAGNOSIS — Z7982 Long term (current) use of aspirin: Secondary | ICD-10-CM | POA: Diagnosis not present

## 2022-12-06 DIAGNOSIS — M79605 Pain in left leg: Secondary | ICD-10-CM | POA: Diagnosis not present

## 2022-12-06 MED ORDER — GABAPENTIN 300 MG PO CAPS
300.0000 mg | ORAL_CAPSULE | Freq: Once | ORAL | Status: AC
  Administered 2022-12-06: 300 mg via ORAL
  Filled 2022-12-06: qty 1

## 2022-12-06 NOTE — ED Provider Notes (Signed)
[place] EMERGENCY DEPARTMENT AT [facility] Provider Note   CSN: [id number] Arrival date & time: 12/06/22  1721     History  Chief Complaint  Patient presents with   Leg Pain    Victor [name] is a 67 y.o. male.  67 year old male with history of stroke a while back with what sounds like neuropathic type pain.  Patient states he was having some pain in his left leg ever since the stroke.  After a while the pain started become in his right leg as well.  He is on Neurontin up to about a year ago and was working really well but he did not follow-up so he never got refills. No new symptoms. No new back pain, incontinence, weakness, falls, rash, fevers or trauma.    Leg Pain      Home Medications Prior to Admission medications   Medication Sig Start Date End Date Taking? Authorizing Provider  aspirin 325 MG tablet Take 0.5 tablets (162.5 mg total) by mouth daily. Patient taking differently: Take 325 mg by mouth daily. 09/06/18   Ihor Austin, NP  gabapentin (NEURONTIN) 300 MG capsule Take 300 mg in morning and afternoon, take 600 mg at bedtime 04/22/22   Ihor Austin, NP  HYDROcodone-acetaminophen (NORCO) 5-325 MG tablet Take 1 tablet by mouth every 6 (six) hours as needed. 04/21/21   Dartha Lodge, PA-C      Allergies    Patient has no known allergies.    Review of Systems   Review of Systems  Physical Exam Updated Vital Signs BP (!) 140/92   Pulse 90   Temp 98.3 F (36.8 C) (Oral)   Resp 17   SpO2 100%  Physical Exam Vitals and nursing note reviewed.  Constitutional:      Appearance: He is well-developed.  HENT:     Head: Normocephalic and atraumatic.  Eyes:     Pupils: Pupils are equal, round, and reactive to light.  Cardiovascular:     Rate and Rhythm: Normal rate.  Pulmonary:     Effort: Pulmonary effort is normal. No respiratory distress.  Abdominal:     General: There is no distension.  Musculoskeletal:        General: No tenderness.  Normal range of motion.     Cervical back: Normal range of motion.  Skin:    General: Skin is warm and dry.  Neurological:     General: No focal deficit present.     Mental Status: He is alert.     Comments: Sensation intact to bilateral lower extremities to light touch Dorsiflexion and plantarflexion are equal     ED Results / Procedures / Treatments   Labs (all labs ordered are listed, but only abnormal results are displayed) Labs Reviewed - No data to display  EKG None  Radiology No results found.  Procedures Procedures    Medications Ordered in ED Medications  gabapentin (NEURONTIN) capsule 300 mg (has no administration in time range)    ED Course/ Medical Decision Making/ A&P                                 Medical Decision Making Risk Prescription drug management.   Will restart Neurontin.  Low suspicion for any new neurologic damage or cauda equina. No indication for imaging at this time.   Final Clinical Impression(s) / ED Diagnoses Final diagnoses:  None    Rx /  DC Orders ED Discharge Orders     None         Mariam Helbert, Barbara Cower, MD 12/06/22 2336

## 2022-12-06 NOTE — ED Notes (Signed)
Patient ambulated to bathroom with rollator.

## 2022-12-06 NOTE — ED Triage Notes (Signed)
Pt came in via POV d/t pains in his legs for a few months. Denies any recent falls/injuries, rates his pain 10/10. Reports he has been stopped on his Gabapentin & since then his leg pain has worsened.

## 2022-12-07 MED ORDER — GABAPENTIN 300 MG PO CAPS: 300.0000 mg | ORAL_CAPSULE | Freq: Three times a day (TID) | ORAL | 1 refills | Status: DC

## 2023-05-09 ENCOUNTER — Ambulatory Visit: Payer: 59 | Admitting: Student

## 2023-05-17 ENCOUNTER — Telehealth: Payer: Self-pay | Admitting: Adult Health

## 2023-05-17 ENCOUNTER — Ambulatory Visit: Payer: 59 | Admitting: Adult Health

## 2023-05-17 ENCOUNTER — Other Ambulatory Visit: Payer: Self-pay | Admitting: *Deleted

## 2023-05-17 NOTE — Telephone Encounter (Signed)
Pt came in office 05/16/2023 to schedule an appt with Ihor Austin NP, wanting to discuss  gabapentin (NEURONTIN) 300 MG capsuleand getting higher dose. Scheduled pt then called 05/17/2023 to let him know appt with [place] needed to be canceled, his primary care handles this prescription and he needs to follow up with them. Would need a new referral to be seen at [facility] since it has been over 2 years.

## 2023-05-17 NOTE — Telephone Encounter (Signed)
Last seen on 02/03/21 No follow up scheduled

## 2023-08-27 ENCOUNTER — Emergency Department (HOSPITAL_COMMUNITY)
Admission: EM | Admit: 2023-08-27 | Discharge: 2023-08-27 | Disposition: A | Discharge status: Home / Self Care | Attending: Emergency Medicine | Admitting: Emergency Medicine

## 2023-08-27 ENCOUNTER — Other Ambulatory Visit: Payer: Self-pay

## 2023-08-27 ENCOUNTER — Encounter (HOSPITAL_COMMUNITY): Payer: Self-pay | Admitting: *Deleted

## 2023-08-27 DIAGNOSIS — Z87442 Personal history of urinary calculi: Secondary | ICD-10-CM | POA: Diagnosis not present

## 2023-08-27 DIAGNOSIS — M79605 Pain in left leg: Secondary | ICD-10-CM | POA: Insufficient documentation

## 2023-08-27 DIAGNOSIS — M545 Low back pain, unspecified: Secondary | ICD-10-CM | POA: Insufficient documentation

## 2023-08-27 DIAGNOSIS — Z8673 Personal history of transient ischemic attack (TIA), and cerebral infarction without residual deficits: Secondary | ICD-10-CM | POA: Insufficient documentation

## 2023-08-27 DIAGNOSIS — Z7982 Long term (current) use of aspirin: Secondary | ICD-10-CM | POA: Diagnosis not present

## 2023-08-27 DIAGNOSIS — I10 Essential (primary) hypertension: Secondary | ICD-10-CM | POA: Insufficient documentation

## 2023-08-27 DIAGNOSIS — M79604 Pain in right leg: Secondary | ICD-10-CM | POA: Diagnosis present

## 2023-08-27 MED ORDER — HYDROCODONE-ACETAMINOPHEN 5-325 MG PO TABS
1.0000 | ORAL_TABLET | Freq: Once | ORAL | Status: DC
  Filled 2023-08-27: qty 1

## 2023-08-27 MED ORDER — GABAPENTIN 300 MG PO CAPS
300.0000 mg | ORAL_CAPSULE | Freq: Once | ORAL | Status: AC
  Administered 2023-08-27: 300 mg via ORAL
  Filled 2023-08-27: qty 1

## 2023-08-27 MED ORDER — GABAPENTIN 300 MG PO CAPS: 300.0000 mg | ORAL_CAPSULE | Freq: Three times a day (TID) | ORAL | 0 refills | Status: DC

## 2023-08-27 MED ORDER — OXYCODONE-ACETAMINOPHEN 5-325 MG PO TABS
1.0000 | ORAL_TABLET | Freq: Once | ORAL | Status: AC
Start: 2023-08-27 — End: 2023-08-27
  Administered 2023-08-27: 1 via ORAL
  Filled 2023-08-27: qty 1

## 2023-08-27 NOTE — Discharge Instructions (Addendum)
 I have refilled your gabapentin  for 1 month. Please establish care with local primary care doctor so they can continue this medication for you. Return here for new concerns.

## 2023-08-27 NOTE — ED Provider Notes (Signed)
 [place] EMERGENCY DEPARTMENT AT [facility] Provider Note   CSN: [id number] Arrival date & time: 08/27/23  2210     History  Chief Complaint  Patient presents with   Back Pain   Leg Pain    Victor [name] is a 68 y.o. male.  The history is provided by the patient and medical records.  Back Pain Associated symptoms: leg pain   Leg Pain Associated symptoms: back pain    68 year old male with history of hypertension, kidney stones, prior stroke, presenting to the ED with low back and bilateral leg pain.  This has been ongoing for many years since his stroke in 2018.  He denies any new injury, trauma, or falls.  He has not had any fever or chills.  Denies any urinary symptoms.  No new numbness or weakness of the legs. No incontinence. He remains ambulatory with his walker which is baseline.  States he feels like he is having increased pain as he has ran out of his gabapentin .  He has been lost to follow-up with his PCP so has not had anyone to give him refills.  While taking this his symptoms are well-controlled.  Home Medications Prior to Admission medications   Medication Sig Start Date End Date Taking? Authorizing Provider  aspirin  325 MG tablet Take 0.5 tablets (162.5 mg total) by mouth daily. Patient taking differently: Take 325 mg by mouth daily. 09/06/18   Johny Nap, NP  gabapentin  (NEURONTIN ) 300 MG capsule Take 1 capsule (300 mg total) by mouth 3 (three) times daily. 12/07/22   Mesner, Reymundo Caulk, MD      Allergies    Patient has no known allergies.    Review of Systems   Review of Systems  Musculoskeletal:  Positive for back pain.  All other systems reviewed and are negative.   Physical Exam Updated Vital Signs BP (!) 152/98 (BP Location: Right Arm)   Pulse 100   Temp 98.3 F (36.8 C)   Resp 18   Ht 5\' 7"  (1.702 m)   Wt 73.5 kg   SpO2 99%   BMI 25.38 kg/m  Physical Exam Vitals and nursing note reviewed.  Constitutional:      Appearance: He is  well-developed.  HENT:     Head: Normocephalic and atraumatic.  Eyes:     Conjunctiva/sclera: Conjunctivae normal.     Pupils: Pupils are equal, round, and reactive to light.  Cardiovascular:     Rate and Rhythm: Normal rate and regular rhythm.     Heart sounds: Normal heart sounds.  Pulmonary:     Effort: Pulmonary effort is normal. No respiratory distress.     Breath sounds: Normal breath sounds. No rhonchi.  Musculoskeletal:        General: Normal range of motion.     Cervical back: Normal range of motion.     Comments: Able to plantar and dorsiflex bilaterally against resistance, normal sensation, ambulatory with walker (baseline)  Skin:    General: Skin is warm and dry.  Neurological:     Mental Status: He is alert and oriented to person, place, and time.     ED Results / Procedures / Treatments   Labs (all labs ordered are listed, but only abnormal results are displayed) Labs Reviewed - No data to display  EKG None  Radiology No results found.  Procedures Procedures    Medications Ordered in ED Medications  gabapentin  (NEURONTIN ) capsule 300 mg (300 mg Oral Given 08/27/23 2328)  oxyCODONE -acetaminophen  (  PERCOCET/ROXICET) 5-325 MG per tablet 1 tablet (1 tablet Oral Given 08/27/23 2328)    ED Course/ Medical Decision Making/ A&P                                 Medical Decision Making Risk Prescription drug management.   68 year old male presenting to the ED with back and bilateral leg pain.  Ongoing issue for many years since his prior stroke.  No new falls or trauma.  He is afebrile and nontoxic in appearance here.  He has no focal neurologic deficits on exam.  He remains ambulatory with his walker which is baseline.  Has recently been out of his gabapentin  so given dose here and refilled for 1 month.  He was encouraged to establish care with PCP so they can continue this.  Can return here for any new or acute changes.  Final Clinical Impression(s) / ED  Diagnoses Final diagnoses:  Bilateral leg pain    Rx / DC Orders ED Discharge Orders          Ordered    gabapentin  (NEURONTIN ) 300 MG capsule  3 times daily        08/27/23 2333              Coretha Dew, PA-C 08/27/23 2343    Lindle Rhea, MD 08/28/23 [phone]

## 2023-08-27 NOTE — ED Triage Notes (Signed)
 The pt is c/o back and leg pain for one week worse tonight no known injury

## 2023-08-28 ENCOUNTER — Emergency Department (HOSPITAL_COMMUNITY)
Admission: EM | Admit: 2023-08-28 | Discharge: 2023-08-28 | Disposition: A | Discharge status: Home / Self Care | Attending: Emergency Medicine | Admitting: Emergency Medicine

## 2023-08-28 ENCOUNTER — Other Ambulatory Visit: Payer: Self-pay

## 2023-08-28 DIAGNOSIS — G8929 Other chronic pain: Secondary | ICD-10-CM | POA: Insufficient documentation

## 2023-08-28 DIAGNOSIS — M545 Low back pain, unspecified: Secondary | ICD-10-CM | POA: Diagnosis not present

## 2023-08-28 DIAGNOSIS — M549 Dorsalgia, unspecified: Secondary | ICD-10-CM | POA: Diagnosis present

## 2023-08-28 DIAGNOSIS — Z7982 Long term (current) use of aspirin: Secondary | ICD-10-CM | POA: Diagnosis not present

## 2023-08-28 MED ORDER — GABAPENTIN 300 MG PO CAPS: 600.0000 mg | ORAL_CAPSULE | Freq: Three times a day (TID) | ORAL | 0 refills | Status: DC

## 2023-08-28 NOTE — ED Notes (Addendum)
 This RN reviewed discharge instructions with patient. He verbalized understanding and denied any further questions. PT well appearing upon discharge and reports tolerable pain. Pt wheeled out to lobby and will ambulate with his walker exiting facility.

## 2023-08-28 NOTE — Discharge Instructions (Addendum)
 Resume taking your Neurontin  medication for your back pain.  Follow-up with a primary care doctor to be rechecked.  They may want to refer you to the pain management doctor to help manage her chronic back issues.

## 2023-08-28 NOTE — ED Triage Notes (Signed)
 Pt was seen here last night for back pain, was unable to get Rx filled due to pharmacy that he uses being closed-- [facility] on [address] -- states that gabapentin  300mg  "just isn't going to work for me- it's not strong enough"

## 2023-08-28 NOTE — ED Provider Notes (Signed)
 [place] EMERGENCY DEPARTMENT AT [facility] Provider Note   CSN: [id number] Arrival date & time: 08/28/23  4034     History  Chief Complaint  Patient presents with   Back Pain    Victor [name] is a 68 y.o. male.   Back Pain    Patient has a history of thoracic aneurysm, spinal cord infarction, stroke.  Patient's had trouble with his gait and chronic back pain ever since his stroke years ago.  Patient states he has not been seen a primary care doctor recently.  Gabapentin  has been affected for his back pain.  He ran out of his medication last week.  Patient was seen in the emergency room last night.  He was given a prescription for gabapentin .  Patient states the pharmacy was not open today.  He came to the emergency room this morning because he could not get his prescription filled.  He also would like to take 600 mg per dose instead of the 300 he was prescribed as that was the dosing he was taking previously.  Home Medications Prior to Admission medications   Medication Sig Start Date End Date Taking? Authorizing Provider  aspirin  325 MG tablet Take 0.5 tablets (162.5 mg total) by mouth daily. Patient taking differently: Take 325 mg by mouth daily. 09/06/18   Johny Nap, NP  gabapentin  (NEURONTIN ) 300 MG capsule Take 2 capsules (600 mg total) by mouth 3 (three) times daily. 08/28/23   Trish Furl, MD      Allergies    Patient has no known allergies.    Review of Systems   Review of Systems  Musculoskeletal:  Positive for back pain.    Physical Exam Updated Vital Signs BP (!) 126/98 (BP Location: Right Arm)   Pulse 86   Temp 98.2 F (36.8 C) (Oral)   Resp 16   SpO2 100%  Physical Exam Vitals and nursing note reviewed.  Constitutional:      General: He is not in acute distress.    Appearance: He is well-developed.  HENT:     Head: Normocephalic and atraumatic.     Right Ear: External ear normal.     Left Ear: External ear normal.  Eyes:      General: No scleral icterus.       Right eye: No discharge.        Left eye: No discharge.     Conjunctiva/sclera: Conjunctivae normal.  Neck:     Trachea: No tracheal deviation.  Cardiovascular:     Rate and Rhythm: Normal rate.  Pulmonary:     Effort: Pulmonary effort is normal. No respiratory distress.     Breath sounds: No stridor.  Abdominal:     General: There is no distension.  Musculoskeletal:        General: No swelling or deformity.     Cervical back: Neck supple.  Skin:    General: Skin is warm and dry.     Findings: No rash.  Neurological:     Mental Status: He is alert. Mental status is at baseline.     Cranial Nerves: No dysarthria or facial asymmetry.     Motor: No seizure activity.     ED Results / Procedures / Treatments   Labs (all labs ordered are listed, but only abnormal results are displayed) Labs Reviewed - No data to display  EKG None  Radiology No results found.  Procedures Procedures    Medications Ordered in ED Medications - No  data to display  ED Course/ Medical Decision Making/ A&P                                 Medical Decision Making Risk Prescription drug management.   Patient denies any changes from his symptoms last today evening.  He is not having any fevers chills.  No acute numbness or weakness.  His back pain is chronic in nature.  I will give him a prescription for the gabapentin  as requested.  Will change it to one of the 24 pharmacy so he can pick up his prescription today.        Final Clinical Impression(s) / ED Diagnoses Final diagnoses:  Chronic low back pain, unspecified back pain laterality, unspecified whether sciatica present    Rx / DC Orders ED Discharge Orders          Ordered    gabapentin  (NEURONTIN ) 300 MG capsule  3 times daily        08/28/23 4403              Trish Furl, MD 08/28/23 [phone]

## 2023-10-17 ENCOUNTER — Emergency Department (HOSPITAL_COMMUNITY)

## 2023-10-17 ENCOUNTER — Emergency Department (HOSPITAL_COMMUNITY)
Admission: EM | Admit: 2023-10-17 | Discharge: 2023-10-17 | Disposition: A | Discharge status: Home / Self Care | Attending: Emergency Medicine | Admitting: Emergency Medicine

## 2023-10-17 ENCOUNTER — Other Ambulatory Visit: Payer: Self-pay

## 2023-10-17 DIAGNOSIS — T426X5A Adverse effect of other antiepileptic and sedative-hypnotic drugs, initial encounter: Secondary | ICD-10-CM | POA: Insufficient documentation

## 2023-10-17 DIAGNOSIS — I10 Essential (primary) hypertension: Secondary | ICD-10-CM | POA: Insufficient documentation

## 2023-10-17 DIAGNOSIS — G5139 Clonic hemifacial spasm, unspecified: Secondary | ICD-10-CM | POA: Diagnosis present

## 2023-10-17 DIAGNOSIS — T50905A Adverse effect of unspecified drugs, medicaments and biological substances, initial encounter: Secondary | ICD-10-CM

## 2023-10-17 LAB — BASIC METABOLIC PANEL WITH GFR
Anion gap: 10 (ref 5–15)
BUN: 9 mg/dL (ref 8–23)
CO2: 23 mmol/L (ref 22–32)
Calcium: 9 mg/dL (ref 8.9–10.3)
Chloride: 107 mmol/L (ref 98–111)
Creatinine, Ser: 1.11 mg/dL (ref 0.61–1.24)
GFR, Estimated: >60 mL/min (ref >60)
Glucose, Bld: 75 mg/dL (ref 70–99)
Potassium: 4.5 mmol/L (ref 3.5–5.1)
Sodium: 140 mmol/L (ref 135–145)

## 2023-10-17 LAB — CBC
HCT: 45.9 % (ref 39.0–52.0)
Hemoglobin: 14.5 g/dL (ref 13.0–17.0)
MCH: 28.5 pg (ref 26.0–34.0)
MCHC: 31.6 g/dL (ref 30.0–36.0)
MCV: 90.2 fL (ref 80.0–100.0)
Platelets: 260 10*3/uL (ref 150–400)
RBC: 5.09 MIL/uL (ref 4.22–5.81)
RDW: 14.6 % (ref 11.5–15.5)
WBC: 6 10*3/uL (ref 4.0–10.5)
nRBC: 0 % (ref 0.0–0.2)

## 2023-10-17 LAB — MAGNESIUM: Magnesium: 2.3 mg/dL (ref 1.7–2.4)

## 2023-10-17 MED ORDER — GABAPENTIN 300 MG PO CAPS: 600.0000 mg | ORAL_CAPSULE | Freq: Three times a day (TID) | ORAL | 0 refills | Status: AC

## 2023-10-17 MED ORDER — KETOROLAC TROMETHAMINE 30 MG/ML IJ SOLN
30.0000 mg | Freq: Once | INTRAMUSCULAR | Status: AC
  Administered 2023-10-17: 30 mg via INTRAVENOUS
  Filled 2023-10-17: qty 1

## 2023-10-17 MED ORDER — GABAPENTIN 300 MG PO CAPS: 300.0000 mg | ORAL_CAPSULE | Freq: Once | ORAL | Status: DC

## 2023-10-17 MED ORDER — GABAPENTIN 300 MG PO CAPS
600.0000 mg | ORAL_CAPSULE | Freq: Once | ORAL | Status: AC
  Administered 2023-10-17: 600 mg via ORAL
  Filled 2023-10-17: qty 2

## 2023-10-17 NOTE — ED Notes (Signed)
 Redraw of labs and urine sent to lab.

## 2023-10-17 NOTE — ED Notes (Signed)
 Patient reports he has been taking more of his gabapentin  as prescribed, and that is why he's having the muscle spasms.

## 2023-10-17 NOTE — Discharge Instructions (Signed)
 You may take gabapentin  600 mg 3 times a day.  Please do not take more as this can cause adverse effects. Please take tylenol  500mg  every 4 hours. You make take 1000mg  at a time but DO NOT TAKE more than 4000mg  in a 24hr period. Please take 400mg  Ibuprofen every 4 hours Thank you for allowing us  to take care of you today.  We hope you begin feeling better soon.   To-Do:  Please follow-up with your primary doctor within the next 2-3 days. Please return to the Emergency Department or call 911 if you experience chest pain, shortness of breath, severe pain, severe fever, altered mental status, or have any reason to think that you need emergency medical care.  Thank you again.  Hope you feel better soon.  [facility] Department of Emergency Medicine

## 2023-10-17 NOTE — ED Triage Notes (Addendum)
 BIB EMS from work. Involuntary Muscle spasm in arms and face x 1 hour last only a few seconds and during episode have difficulty finding workds. Denies any pain. Negative stroke screen. Pt texting on phone during triage  EMS VS  20RAC 138/94 88 18rr 99% ra 112 cbg

## 2023-10-17 NOTE — ED Provider Triage Note (Signed)
 Emergency Medicine Provider Triage Evaluation Note  Victor [name] , a 68 y.o. male  was evaluated in triage.  Pt complains of spasms with deep speaking.  Physical Exam  BP 133/89 (BP Location: Left Arm)   Pulse 77   Temp 98.4 F (36.9 C) (Oral)   Resp 18   Wt 73.5 kg   SpO2 100%   BMI 25.38 kg/m  Has episodes where he straightens up.  Slight spitting with it.  Medical Decision Making  Medically screening exam initiated at 1:13 PM.  Appropriate orders placed.  Victor [name] was informed that the remainder of the evaluation will be completed by another provider, this initial triage assessment does not replace that evaluation, and the importance of remaining in the ED until their evaluation is complete.  Patient states he is having episodes of spasm he cannot control.Had witnessed episodes of it where he was briefly taken out.  Does have some difficulty speaking potentially after our episode itself is very brief.  Will repeat CT based     Patsey Lot, MD 10/17/23 1314

## 2023-10-17 NOTE — ED Provider Notes (Signed)
 [place] EMERGENCY DEPARTMENT AT [facility] Provider Note   CSN: [id number] Arrival date & time: 10/17/23  1236     History Chief Complaint  Patient presents with   Spasms    Victor [name] is a 68 y.o. male w/ PMHx CVA with chronic left leg pain, hypertension, aortic aneurysm repair chronic pain who presents to the ED for evaluation of spasms.  Patient states he has been taking large amounts of gabapentin .  He states since then he has noted facial spasms and difficulty with word finding.  Patient denies any headache.  No numbness or weakness.  Patient states he has been taking the gabapentin  as it is the only thing that helps with his leg.  He states he was previously prescribed this medicine ran out of his previous dose has been taking another person's gabapentin  but has been taking at least 7 pills of 600 mg tablets multiple times a day.  No other complaints at this time.     Physical Exam Updated Vital Signs BP 115/82 (BP Location: Right Arm)   Pulse 85   Temp 98.3 F (36.8 C) (Oral)   Resp (!) 24   Wt 73.5 kg   SpO2 100%   BMI 25.38 kg/m  Physical Exam Vitals and nursing note reviewed.  Constitutional:      General: He is not in acute distress.    Appearance: Normal appearance. He is well-developed. He is not ill-appearing.  HENT:     Head: Normocephalic and atraumatic.   Eyes:     Extraocular Movements: Extraocular movements intact.     Conjunctiva/sclera: Conjunctivae normal.     Pupils: Pupils are equal, round, and reactive to light.    Cardiovascular:     Rate and Rhythm: Normal rate and regular rhythm.     Pulses: Normal pulses.     Heart sounds: Normal heart sounds. No murmur heard. Pulmonary:     Effort: Pulmonary effort is normal. No respiratory distress.     Breath sounds: Normal breath sounds.   Musculoskeletal:        General: No swelling. Normal range of motion.     Cervical back: Neck supple.   Skin:    General: Skin is  warm and dry.     Capillary Refill: Capillary refill takes less than 2 seconds.   Neurological:     General: No focal deficit present.     Mental Status: He is alert.     Cranial Nerves: No cranial nerve deficit.     Motor: No weakness.     Coordination: Coordination normal.   Psychiatric:        Mood and Affect: Mood normal.     ED Results / Procedures / Treatments   Labs (all labs ordered are listed, but only abnormal results are displayed) Labs Reviewed  CBC  BASIC METABOLIC PANEL WITH GFR  MAGNESIUM     EKG None  Radiology CT Head Wo Contrast Result Date: 10/17/2023 CLINICAL DATA:  Mental status change, unknown cause EXAM: CT HEAD WITHOUT CONTRAST TECHNIQUE: Contiguous axial images were obtained from the base of the skull through the vertex without intravenous contrast. RADIATION DOSE REDUCTION: This exam was performed according to the departmental dose-optimization program which includes automated exposure control, adjustment of the mA and/or kV according to patient size and/or use of iterative reconstruction technique. COMPARISON:  CT of the head dated February 09, 2018. FINDINGS: Brain: Age-related volume loss. Mild-to-moderate periventricular and deep cerebral white matter disease. No evidence  of hemorrhage, mass, acute cortical infarct or hydrocephalus. Vascular: Mild calcific atheromatous disease. Skull: Intact and unremarkable. Sinuses/Orbits: Circumferential mucosal disease within the maxillary sinuses bilaterally. Chronic right nasal fracture. The orbits are unremarkable. Other: None. IMPRESSION: 1. 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 2. Mild bilateral maxillary sinus disease. Electronically Signed   By: Evalene Coho M.D.   On: 10/17/2023 15:03    Medications Ordered in ED Medications  ketorolac (TORADOL) 30 MG/ML injection 30 mg (has no administration in time range)  gabapentin  (NEURONTIN ) capsule 600 mg (has no administration in  time range)    ED Course/ Medical Decision Making/ A&P  Victor [name] is a 67 y.o. male presents as detailed above  Differential ddx: Medication adverse effect, electrolyte abnormalities, intracranial abnormality,  On arrival, patient afebrile hemodynamically stable no hypoxia or respiratory distress.  No neurologic deficits.  ED Work-up: Please see details of labs and imaging listed above. CT head without acute intracranial abnormality. No significant abnormality on CBC. BMP & Mag WNL  Per chart review, patient was seen in ED 08/28/2023 for chronic pain.  At that time patient was prescribed gabapentin  600 mg 3 times a day.   Discussed with patient symptoms are likely secondary to gabapentin  in such high doses.  Advised that discontinuing gabapentin  altogether can put him into acute withdrawal. BMP without evidence of acute kidney injury.  Will represcribe previous 600 mg 3 times a day.  Will administer 600 mg gabapentin  in ED as he has not had any since yesterday and do not want to precipitate withdrawal.  Will also administer dose of Toradol.  Patient will need to follow-up with his primary care physician for refills of this medicine. Patient stable for discharge outpatient follow-up    Overall impression medication adverse effect, chronic pain Patient stable for discharge and outpatient follow-up. Strict return precautions provided. Patient voices understanding and agrees with plan.   Patient seen with supervising physician who agrees with plan.  Final Clinical Impression(s) / ED Diagnoses Final diagnoses:  Adverse effect of drug, initial encounter    Rx / DC Orders ED Discharge Orders          Ordered    gabapentin  (NEURONTIN ) 300 MG capsule  3 times daily        10/17/23 1908            Waddell Seats, DO PGY-3 Emergency Medicine    Seats Waddell, DO 10/17/23 1921    Patt Alm Macho, MD 10/18/23 [phone]

## 2024-04-19 ENCOUNTER — Emergency Department (HOSPITAL_COMMUNITY): Admission: EM | Admit: 2024-04-19 | Discharge: 2024-04-19 | Disposition: A | Discharge status: Home / Self Care

## 2024-04-19 ENCOUNTER — Emergency Department (HOSPITAL_COMMUNITY)

## 2024-04-19 ENCOUNTER — Other Ambulatory Visit: Payer: Self-pay

## 2024-04-19 DIAGNOSIS — G8929 Other chronic pain: Secondary | ICD-10-CM | POA: Insufficient documentation

## 2024-04-19 DIAGNOSIS — Z7982 Long term (current) use of aspirin: Secondary | ICD-10-CM | POA: Insufficient documentation

## 2024-04-19 DIAGNOSIS — K4090 Unilateral inguinal hernia, without obstruction or gangrene, not specified as recurrent: Secondary | ICD-10-CM | POA: Insufficient documentation

## 2024-04-19 DIAGNOSIS — I10 Essential (primary) hypertension: Secondary | ICD-10-CM | POA: Diagnosis not present

## 2024-04-19 DIAGNOSIS — R079 Chest pain, unspecified: Secondary | ICD-10-CM | POA: Diagnosis present

## 2024-04-19 DIAGNOSIS — R0609 Other forms of dyspnea: Secondary | ICD-10-CM | POA: Insufficient documentation

## 2024-04-19 DIAGNOSIS — M79662 Pain in left lower leg: Secondary | ICD-10-CM | POA: Diagnosis not present

## 2024-04-19 DIAGNOSIS — R918 Other nonspecific abnormal finding of lung field: Secondary | ICD-10-CM | POA: Insufficient documentation

## 2024-04-19 DIAGNOSIS — K8020 Calculus of gallbladder without cholecystitis without obstruction: Secondary | ICD-10-CM | POA: Diagnosis not present

## 2024-04-19 DIAGNOSIS — I71012 Dissection of descending thoracic aorta: Secondary | ICD-10-CM | POA: Diagnosis not present

## 2024-04-19 DIAGNOSIS — M79661 Pain in right lower leg: Secondary | ICD-10-CM | POA: Diagnosis not present

## 2024-04-19 LAB — PRO BRAIN NATRIURETIC PEPTIDE: Pro Brain Natriuretic Peptide: 161 pg/mL

## 2024-04-19 LAB — HEPATIC FUNCTION PANEL
ALT: 9 U/L (ref 0–44)
AST: 23 U/L (ref 15–41)
Albumin: 3.8 g/dL (ref 3.5–5.0)
Alkaline Phosphatase: 95 U/L (ref 38–126)
Bilirubin, Direct: 0.2 mg/dL (ref 0.0–0.2)
Indirect Bilirubin: 0.6 mg/dL (ref 0.3–0.9)
Total Bilirubin: 0.8 mg/dL (ref 0.0–1.2)
Total Protein: 6.9 g/dL (ref 6.5–8.1)

## 2024-04-19 LAB — BASIC METABOLIC PANEL WITH GFR
Anion gap: 12 (ref 5–15)
BUN: 16 mg/dL (ref 8–23)
CO2: 22 mmol/L (ref 22–32)
Calcium: 8.9 mg/dL (ref 8.9–10.3)
Chloride: 103 mmol/L (ref 98–111)
Creatinine, Ser: 1.03 mg/dL (ref 0.61–1.24)
GFR, Estimated: >60 mL/min
Glucose, Bld: 103 mg/dL — ABNORMAL HIGH (ref 70–99)
Potassium: 5.1 mmol/L (ref 3.5–5.1)
Sodium: 137 mmol/L (ref 135–145)

## 2024-04-19 LAB — CBC WITH DIFFERENTIAL/PLATELET
Abs Immature Granulocytes: 0.03 K/uL (ref 0.00–0.07)
Basophils Absolute: 0.1 K/uL (ref 0.0–0.1)
Basophils Relative: 1 %
Eosinophils Absolute: 0.5 K/uL (ref 0.0–0.5)
Eosinophils Relative: 6 %
HCT: 46.8 % (ref 39.0–52.0)
Hemoglobin: 14.8 g/dL (ref 13.0–17.0)
Immature Granulocytes: 0 %
Lymphocytes Relative: 18 %
Lymphs Abs: 1.6 K/uL (ref 0.7–4.0)
MCH: 28.9 pg (ref 26.0–34.0)
MCHC: 31.6 g/dL (ref 30.0–36.0)
MCV: 91.4 fL (ref 80.0–100.0)
Monocytes Absolute: 0.7 K/uL (ref 0.1–1.0)
Monocytes Relative: 7 %
Neutro Abs: 6 K/uL (ref 1.7–7.7)
Neutrophils Relative %: 68 %
Platelets: 278 K/uL (ref 150–400)
RBC: 5.12 MIL/uL (ref 4.22–5.81)
RDW: 14 % (ref 11.5–15.5)
WBC: 8.8 K/uL (ref 4.0–10.5)
nRBC: 0 % (ref 0.0–0.2)

## 2024-04-19 LAB — TROPONIN T, HIGH SENSITIVITY
Troponin T High Sensitivity: 79 ng/L — ABNORMAL HIGH (ref 0–19)
Troponin T High Sensitivity: 61 ng/L — ABNORMAL HIGH (ref 0–19)

## 2024-04-19 LAB — LIPASE, BLOOD: Lipase: 16 U/L (ref 11–51)

## 2024-04-19 MED ORDER — MORPHINE SULFATE (PF) 2 MG/ML IV SOLN
2.0000 mg | Freq: Once | INTRAVENOUS | Status: AC
  Administered 2024-04-19: 2 mg via INTRAVENOUS
  Filled 2024-04-19: qty 1

## 2024-04-19 MED ORDER — OXYCODONE-ACETAMINOPHEN 5-325 MG PO TABS
1.0000 | ORAL_TABLET | Freq: Once | ORAL | Status: AC
  Administered 2024-04-19: 1 via ORAL
  Filled 2024-04-19: qty 1

## 2024-04-19 MED ORDER — IOHEXOL 350 MG/ML SOLN
100.0000 mL | Freq: Once | INTRAVENOUS | Status: AC | PRN
  Administered 2024-04-19: 100 mL via INTRAVENOUS

## 2024-04-19 NOTE — ED Provider Notes (Signed)
 "  EMERGENCY DEPARTMENT AT [facility] Provider Note   CSN: [id number] Arrival date & time: 04/19/24  9181     Patient presents with: Chest Pain and Leg Pain   Victor [name] is a 68 y.o. male.   This is a 68 year old male presenting emergency department for evaluation of chest pain as well as having worsening pain to his bilateral lower legs.  He reports that he has had off-and-on chest pain since his dissection surgery several years ago.  Has had chest pain for the past few weeks, seemingly worsened over the past couple days.  Having some dyspnea on exertion.  No nausea no vomiting.  Chest pain does not change with exertion however.  He is also complaining of pain to his bilateral feet.  Reports chronic pain.  Denies any numbness tingling changes in sensation.  He notes that he recently got out of jail 2 weeks ago has not been on any of his home medications since leaving jail.   Chest Pain Leg Pain      Prior to Admission medications  Medication Sig Start Date End Date Taking? Authorizing Provider  aspirin  325 MG tablet Take 0.5 tablets (162.5 mg total) by mouth daily. Patient taking differently: Take 325 mg by mouth daily. 09/06/18   Victor Raisin, NP  gabapentin  (NEURONTIN ) 300 MG capsule Take 2 capsules (600 mg total) by mouth 3 (three) times daily. 10/17/23   Victor Birmingham, DO    Allergies: Patient has no known allergies.    Review of Systems  Cardiovascular:  Positive for chest pain.    Updated Vital Signs BP (!) 121/99   Pulse 92   Temp 98.3 F (36.8 C) (Oral)   Resp 18   Ht 5' 7 (1.702 m)   Wt 73 kg   SpO2 100%   BMI 25.21 kg/m   Physical Exam Vitals and nursing note reviewed.  Constitutional:      General: He is not in acute distress.    Appearance: He is not toxic-appearing.  Cardiovascular:     Rate and Rhythm: Normal rate and regular rhythm.     Pulses:          Radial pulses are 2+ on the right side and 2+ on the left side.        Dorsalis pedis pulses are 2+ on the right side and 2+ on the left side.     Heart sounds: Normal heart sounds.  Pulmonary:     Effort: Pulmonary effort is normal.     Breath sounds: Normal breath sounds.  Abdominal:     Palpations: Abdomen is soft.  Musculoskeletal:     Right lower leg: No edema.     Left lower leg: No edema.  Skin:    General: Skin is warm and dry.     Capillary Refill: Capillary refill takes less than 2 seconds.  Neurological:     General: No focal deficit present.     Mental Status: He is alert.  Psychiatric:        Mood and Affect: Mood normal.        Behavior: Behavior normal.     (all labs ordered are listed, but only abnormal results are displayed) Labs Reviewed  BASIC METABOLIC PANEL WITH GFR - Abnormal; Notable for the following components:      Result Value   Glucose, Bld 103 (*)    All other components within normal limits  TROPONIN T, HIGH SENSITIVITY - Abnormal; Notable  for the following components:   Troponin T High Sensitivity 79 (*)    All other components within normal limits  TROPONIN T, HIGH SENSITIVITY - Abnormal; Notable for the following components:   Troponin T High Sensitivity 61 (*)    All other components within normal limits  CBC WITH DIFFERENTIAL/PLATELET  PRO BRAIN NATRIURETIC PEPTIDE  LIPASE, BLOOD  HEPATIC FUNCTION PANEL  I-STAT CHEM 8, ED    EKG: EKG Interpretation Date/Time:  Thursday April 19 2024 08:24:57 EST Ventricular Rate:  92 PR Interval:  166 QRS Duration:  70 QT Interval:  352 QTC Calculation: 436 R Axis:   11  Text Interpretation: Sinus rhythm Atrial premature complex Left atrial enlargement Probable anterior infarct, old Confirmed by Victor [name] [id number]) on 04/19/2024 12:37:31 PM  Radiology: CT Angio Chest/Abd/Pel for Dissection W and/or Wo Contrast Result Date: 04/19/2024 CLINICAL DATA:  Suspected acute aortic syndrome. EXAM: CT ANGIOGRAPHY CHEST, ABDOMEN AND PELVIS TECHNIQUE: Non-contrast CT  of the chest was initially obtained. Multidetector CT imaging through the chest, abdomen and pelvis was performed using the standard protocol during bolus administration of intravenous contrast. Multiplanar reconstructed images and MIPs were obtained and reviewed to evaluate the vascular anatomy. RADIATION DOSE REDUCTION: This exam was performed according to the departmental dose-optimization program which includes automated exposure control, adjustment of the mA and/or kV according to patient size and/or use of iterative reconstruction technique. CONTRAST:  OMNIPAQUE  IOHEXOL  350 MG/ML SOLN COMPARISON:  March 07, 2017 FINDINGS: CTA CHEST FINDINGS Cardiovascular: Preferential opacification of the thoracic aorta. The descending thoracic aorta measures 3.7 cm in diameter. There is extensive aortic stenting seen extending from the proximal portion of the aortic arch throughout the suprarenal abdominal aorta. A stable appearing dissection is seen extending from the distal descending thoracic aorta to the infrarenal portion of the abdominal aorta. A 5.6 cm x 3.0 cm partially thrombosed false lumen is noted along the anterolateral aspect of the descending thoracic aorta on the right. An additional 2.3 cm x 1.6 cm false lumen seen at the level of the right renal artery. Normal heart size. No pericardial effusion. Mediastinum/Nodes: No enlarged mediastinal, hilar, or axillary lymph nodes. Thyroid gland, trachea, and esophagus demonstrate no significant findings. Lungs/Pleura: There is evidence of paraseptal and centrilobular emphysematous lung disease. A 3.3 cm x 3.2 cm x 3.4 cm heterogeneous nonenhancing low-attenuation lung mass is seen within the lateral aspect of the right upper lobe. There is a mild amount of adjacent posterolateral right upper lobe linear scarring and/or atelectasis. Mild right middle lobe and bilateral lower lobe scarring and/or atelectasis is seen, right greater than left. A trace amount of  pleural fluid is seen on the right. No pneumothorax is identified. Musculoskeletal: No chest wall abnormality. No acute or significant osseous findings. Review of the MIP images confirms the above findings. CTA ABDOMEN AND PELVIS FINDINGS VASCULAR Aorta: Extensive stenting of the suprarenal abdominal aorta, as described above with stable aortic dissection to involve the infrarenal abdominal aorta. Celiac: Patent without evidence of aneurysm, dissection, vasculitis or significant stenosis. SMA: Patent without evidence of aneurysm, dissection, vasculitis or significant stenosis. Renals: Both renal arteries are patent without evidence of aneurysm, dissection, vasculitis, fibromuscular dysplasia or significant stenosis. IMA: Patent without evidence of aneurysm, dissection, vasculitis or significant stenosis. Inflow: Patent bilateral iliac arteries are seen without evidence of significant stenosis. 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 Veins: No obvious venous  abnormality within the limitations of this arterial phase study. Review of the MIP images confirms the above findings. NON-VASCULAR Hepatobiliary: No focal liver abnormality is seen. Multiple small gallstones are seen within the gallbladder lumen without evidence of pericholecystic inflammation, gallbladder wall thickening, or biliary dilatation. Pancreas: The head of the pancreas is enlarged (measures 2.9 cm x 4.7 cm. Enlargement of the adjacent portion of the body of the pancreas is also noted (3.3 cm x 3.6 cm. There is no evidence of pancreatic ductal dilatation or peripancreatic inflammatory changes. Spleen: Normal in size without focal abnormality. Adrenals/Urinary Tract: Adrenal glands are unremarkable. Kidneys are normal in size, without renal calculi or hydronephrosis. A 1.3 cm diameter simple cyst is seen along the posterolateral aspect of the mid left kidney.  Bladder is unremarkable. Stomach/Bowel: Stomach is within normal limits. Appendix appears normal. No evidence of bowel wall thickening, distention, or inflammatory changes. Lymphatic: No abnormal abdominal or pelvic lymph nodes are identified. Reproductive: The prostate gland is mildly enlarged. Other: A predominantly stable 3.4 cm x 4.6 cm left inguinal hernia is seen. This contains fat and a segment of proximal sigmoid colon. Musculoskeletal: No acute or significant osseous findings. Review of the MIP images confirms the above findings. IMPRESSION: 1. Stable descending thoracic aortic dissection extending to the infrarenal portion of the abdominal aorta. 2. Extensive thoracic aortic stenting, as described above. 3. 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 4. 3.3 cm x 3.2 cm x 3.4 cm heterogeneous nonenhancing low-attenuation lung mass within the lateral aspect of the right upper lobe, concerning for malignancy. 5. Enlarged head and body of the pancreas which may represent sequelae associated with pancreatitis. Correlation with pancreatic enzymes is recommended. MRI follow-up is also recommended to exclude the presence of an underlying neoplastic process. 6. Cholelithiasis. 7. Stable left inguinal hernia containing fat and a segment of proximal sigmoid colon. 8. Aortic atherosclerosis. Electronically Signed   By: Suzen Dials M.D.   On: 04/19/2024 11:50   DG Chest Portable 1 View Result Date: 04/19/2024 CLINICAL DATA:  Chest pain. EXAM: PORTABLE CHEST 1 VIEW COMPARISON:  04/21/2021 and CT chest 03/07/2017. FINDINGS: Trachea is midline. Heart is enlarged. Aortic endovascular stent graft. 2.9 x 4.5 cm mass in the right upper lobe, new. Mild bibasilar interstitial prominence with subsegmental atelectasis in the right lung base. Pleural thickening in the right lung with possible associated nodularity. No definite pleural fluid. IMPRESSION: 1.  Right upper lobe mass, highly worrisome for primary bronchogenic carcinoma. The possibility of a fluid-filled bullous lesion is not entirely excluded. Recommend CT chest with contrast in further evaluation, as clinically indicated. 2. Pleural thickening and possible nodularity in the right hemithorax, also worrisome for malignancy. 3. Bibasilar interstitial prominence may be due to aspiration or an infectious bronchiolitis. Electronically Signed   By: Newell Eke M.D.   On: 04/19/2024 09:14     Procedures   Medications Ordered in the ED  oxyCODONE -acetaminophen  (PERCOCET/ROXICET) 5-325 MG per tablet 1 tablet (1 tablet Oral Given 04/19/24 0844)  iohexol  (OMNIPAQUE ) 350 MG/ML injection 100 mL (100 mLs Intravenous Contrast Given 04/19/24 1043)  morphine  (PF) 2 MG/ML injection 2 mg (2 mg Intravenous Given 04/19/24 1433)    Clinical Course as of 04/19/24 1445  Thu Apr 19, 2024  1142 DG Chest Portable 1 View IMPRESSION: 1. Right upper lobe mass, highly worrisome for primary bronchogenic carcinoma. The possibility of a fluid-filled bullous lesion is not entirely excluded. Recommend CT chest with  contrast in further evaluation, as clinically indicated. 2. Pleural thickening and possible nodularity in the right hemithorax, also worrisome for malignancy. 3. Bibasilar interstitial prominence may be due to aspiration or an infectious bronchiolitis.   Electronically Signed   By: Newell Eke M.D.   On: 04/19/2024 09:14   [TY]  1212 CT Angio Chest/Abd/Pel for Dissection W and/or Wo Contrast IMPRESSION: 1. Stable descending thoracic aortic dissection extending to the infrarenal portion of the abdominal aorta. 2. Extensive thoracic aortic stenting, as described above. 3. 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 4. 3.3 cm x 3.2 cm x 3.4 cm heterogeneous nonenhancing low-attenuation lung mass within the lateral aspect of  the right upper lobe, concerning for malignancy. 5. Enlarged head and body of the pancreas which may represent sequelae associated with pancreatitis. Correlation with pancreatic enzymes is recommended. MRI follow-up is also recommended to exclude the presence of an underlying neoplastic process. 6. Cholelithiasis. 7. Stable left inguinal hernia containing fat and a segment of proximal sigmoid colon. 8. Aortic atherosclerosis.   Electronically Signed   [TY]  1441 Lipase: 16 Acute pancreatitis less likely [TY]  1441 Hepatic function panel No derangements consistent with acute hepatobiliary disease. [TY]  1442 Patient is feeling improved.  Troponin flat/downtrending.  I did discuss CT scan findings with lung mass and possible pancreatic neoplastic process.  He reports that he has been having his chest pain symptoms and leg symptoms for quite some time now.  Given workup today I do not think he would necessarily benefit from an acute hospitalization, but further workup for malignancy can be deferred outpatient.  Refer to hematology oncology.  Will also refer to cardiology.  Patient is agreeable to plan.  Will discharge in stable condition.  Strict return precautions given. [TY]    Clinical Course User Index [TY] Victor Caron PARAS, DO                                 Medical Decision Making Is a well-appearing 68 year old male has a history of thoracic aortic aneurysm status post repair, hypertension, and CVA presenting emergency department with chest pain and worsening chronic leg pain.  EMS reported hypotension 180/100.  They also gave aspirin .  He is 125/96 here.  He has clear lungs, equal pulses.  Does not appear to be in distress.  Will get cardiac screening labs.  Given his history of aneurysm with worsening chest pain will get CTA to evaluate for dissection.  Will give Percocet for his chronic leg pain.  It seems like his symptoms however are more subacute/chronic as he has had them for  quite some time.  He is got equal pulses in his extremities, lower suspicion for acute leg ischemia. See ED course for further MDM final disposition  Amount and/or Complexity of Data Reviewed External Data Reviewed:     Details: It appears he last saw vascular surgery in the office 2019. Labs: ordered. Decision-making details documented in ED Course.    Details: See ED course Radiology: ordered and independent interpretation performed. Decision-making details documented in ED Course.    Details: Dissection appreciated on CT scan, similar to prior ECG/medicine tests: independent interpretation performed. Decision-making details documented in ED Course.    Details: No ischemic changes  Risk Prescription drug management. Decision regarding hospitalization. Diagnosis or treatment significantly limited by social determinants of health. Risk Details: Recently in jail.  Final diagnoses:  Chest pain, unspecified type  Chronic pain of both lower extremities  Lung mass    ED Discharge Orders          Ordered    Ambulatory referral to Hematology / Oncology        04/19/24 1407    Ambulatory referral to Cardiology       Comments: If you have not heard from the Cardiology office within the next 72 hours please call [phone].   04/19/24 1408               Victor Caron PARAS, DO 04/19/24 1445  "

## 2024-04-19 NOTE — Discharge Instructions (Addendum)
 Please follow-up with your primary doctors regarding pain medications for your chronic leg pain..  I have referred you to oncology for your lung and pancreatic masses.  I have also referred you to cardiology for further evaluation of your chest pain.  Return for fevers, chills, severe pain, inability eat or drink due to nausea or vomiting, difficulty breathing, worsening chest pain or any new or worsening symptoms that are concerning to you.

## 2024-04-19 NOTE — ED Triage Notes (Signed)
 Patient arrives via [facility] for cp and leg pain chronic in nature but worsened over last couple days. 324 ASA given without nitroglycerin . 12 lead unremarkable. BP 180/100, HR 80, 98 on room air, cbg 101. GCS 15.

## 2024-04-19 NOTE — ED Notes (Signed)
 Patient states he wants to leave because he's in pain. RN explained to him there is not an order in for pain meds at this time I will ask the doctor for something. EDP made notified that patient is in pain, would like to see him and would like to leave.

## 2024-04-23 ENCOUNTER — Encounter: Payer: Self-pay | Admitting: Medical Oncology

## 2024-04-23 ENCOUNTER — Ambulatory Visit: Admitting: Physician Assistant

## 2024-04-24 NOTE — Progress Notes (Deleted)
 " Cardiology Office Note   Date:  04/24/2024  ID:  JEREK MEULEMANS, DOB [birth year], MRN [id number] PCP: Patient, No Pcp Per  [facility] Providers Cardiologist:  [name], MD    History of Present Illness Victor [name] is a 68 y.o. male with a past medical history of tobacco abuse, polysubstance abuse, thoracic aortic dissection, coronary calcifications, history of spinal stroke.  Previously followed by Dr. Maxcy.  Presents today to reestablish care for evaluation of chest.  In 09/2014, patient underwent repair of the ascending aortic dissection with replacement of a ascending aorta and suspension of the aortic valve with cardiopulmonary bypass.  TEE during surgery revealed EF 60-65%.  Patient later had progressive enlargement of the thoracic aorta measuring up to 5.6 cm. He underwent nuclear stress test in 11/2016 that showed no evidence of ischemia.  Echocardiogram in 11/2016 showed EF 60 to 65% with no wall motion abnormalities, grade 2 diastolic dysfunction.  There was trivial AI.  Aortic root and ascending aorta were normal in size. Patient underwent cardiac catheterization 12/2016 that showed widely patent, angiographically normal dominant RCA, normal LAD and left circumflex.  He underwent aortic arch deep branching and replacement of the aortic arch in 12/2016.  Patient has not been seen by cardiology since 07/2017  Patient was seen in the ED on 04/19/2024 for evaluation of chest pain and worsening pain in bilateral lower legs.  Reported that he has had on and off chest pain since his dissection surgery several years ago.  He had reportedly gotten out of jail 2 weeks prior and had not been taking his home medications since leaving.  In the ED, troponin 79> 61.  proBNP 161.  CMP grossly within normal limits.  CBC within normal limits.  Chest x-ray with right upper lobe mass, highly worrisome for primary bronchogenic carcinoma.  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  There was a 3.3x 3.2x 3.4 cm lung mass concerning for malignancy.  Patient was referred to hematology/oncology given findings concerning for lung mass and possible pancreatic neoplastic process.  He was also referred to cardiology.  Chest pain  - Patient previously had cath in 2018 that showed widely patent, angiographically normal coronary arteries - Presented to the ED on 12/25 with chest pain, leg pain.  High-sensitivity troponin 79> 61 -   Thoracic aortic aneurysm s/p repair - Patient previously had a type I dissection in 2015.  Had repair along with replacement of the ascending aorta and resuspension of the aortic valve.  He had subsequent progressive enlargement of the thoracic aorta.  Underwent surgical repair in 2018 - CTA chest/ab/pelvis in the ED showed stable descending thoracic aortic dissection extending to the infrarenal portion of the abdominal aorta, extensive thoracic aortic stenting  PAD - S/p fem-fem bypass in 2016 - Referred to vascular surgery  ROS: ***  Studies Reviewed      *** Risk Assessment/Calculations {Does this patient have ATRIAL FIBRILLATION?:[id number]} No BP recorded.  {Refresh Note OR Click here to enter BP  :1}***       Physical Exam VS:  There were no vitals taken for this visit.       Wt Readings from Last 3 Encounters:  04/19/24 160 lb 15 oz (73 kg)  10/17/23 162 lb 0.6 oz (73.5 kg)  08/27/23 162 lb  0.6 oz (73.5 kg)    GEN: Well nourished, well developed in no acute distress NECK: No JVD; No carotid bruits CARDIAC: ***RRR, no murmurs, rubs, gallops RESPIRATORY:  Clear to auscultation without rales, wheezing or rhonchi  ABDOMEN: Soft, non-tender, non-distended EXTREMITIES:  No edema; No deformity   ASSESSMENT AND  PLAN ***    {Are you ordering a CV Procedure (e.g. stress test, cath, DCCV, TEE, etc)?   Press F2        :[id number]}  Dispo: ***  Signed, Rollo FABIENE Louder, PA-C   "

## 2024-04-27 ENCOUNTER — Inpatient Hospital Stay

## 2024-04-27 ENCOUNTER — Telehealth: Payer: Self-pay | Admitting: Acute Care

## 2024-04-27 ENCOUNTER — Other Ambulatory Visit: Payer: Self-pay

## 2024-04-27 ENCOUNTER — Encounter: Payer: Self-pay | Admitting: Medical Oncology

## 2024-04-27 ENCOUNTER — Other Ambulatory Visit (HOSPITAL_COMMUNITY): Payer: Self-pay

## 2024-04-27 ENCOUNTER — Encounter: Payer: Self-pay | Admitting: Physician Assistant

## 2024-04-27 ENCOUNTER — Inpatient Hospital Stay: Attending: Physician Assistant | Admitting: Physician Assistant

## 2024-04-27 ENCOUNTER — Ambulatory Visit: Admitting: Cardiology

## 2024-04-27 VITALS — BP 133/73 | HR 103 | Temp 98.4°F | Resp 18 | Ht 67.0 in | Wt 145.0 lb

## 2024-04-27 DIAGNOSIS — R918 Other nonspecific abnormal finding of lung field: Secondary | ICD-10-CM

## 2024-04-27 DIAGNOSIS — Z8673 Personal history of transient ischemic attack (TIA), and cerebral infarction without residual deficits: Secondary | ICD-10-CM | POA: Insufficient documentation

## 2024-04-27 DIAGNOSIS — K869 Disease of pancreas, unspecified: Secondary | ICD-10-CM | POA: Diagnosis not present

## 2024-04-27 DIAGNOSIS — R079 Chest pain, unspecified: Secondary | ICD-10-CM | POA: Insufficient documentation

## 2024-04-27 DIAGNOSIS — Z87891 Personal history of nicotine dependence: Secondary | ICD-10-CM | POA: Diagnosis not present

## 2024-04-27 LAB — CBC WITH DIFFERENTIAL (CANCER CENTER ONLY)
Abs Immature Granulocytes: 0.01 K/uL (ref 0.00–0.07)
Basophils Absolute: 0 K/uL (ref 0.0–0.1)
Basophils Relative: 1 %
Eosinophils Absolute: 0.1 K/uL (ref 0.0–0.5)
Eosinophils Relative: 2 %
HCT: 48.1 % (ref 39.0–52.0)
Hemoglobin: 15.4 g/dL (ref 13.0–17.0)
Immature Granulocytes: 0 %
Lymphocytes Relative: 19 %
Lymphs Abs: 1.2 K/uL (ref 0.7–4.0)
MCH: 28.2 pg (ref 26.0–34.0)
MCHC: 32 g/dL (ref 30.0–36.0)
MCV: 87.9 fL (ref 80.0–100.0)
Monocytes Absolute: 0.5 K/uL (ref 0.1–1.0)
Monocytes Relative: 9 %
Neutro Abs: 4.4 K/uL (ref 1.7–7.7)
Neutrophils Relative %: 69 %
Platelet Count: 268 K/uL (ref 150–400)
RBC: 5.47 MIL/uL (ref 4.22–5.81)
RDW: 13.6 % (ref 11.5–15.5)
WBC Count: 6.4 K/uL (ref 4.0–10.5)
nRBC: 0 % (ref 0.0–0.2)

## 2024-04-27 LAB — CMP (CANCER CENTER ONLY)
ALT: 11 U/L (ref 0–44)
AST: 19 U/L (ref 15–41)
Albumin: 4.4 g/dL (ref 3.5–5.0)
Alkaline Phosphatase: 94 U/L (ref 38–126)
Anion gap: 10 (ref 5–15)
BUN: 19 mg/dL (ref 8–23)
CO2: 25 mmol/L (ref 22–32)
Calcium: 9.5 mg/dL (ref 8.9–10.3)
Chloride: 106 mmol/L (ref 98–111)
Creatinine: 1.03 mg/dL (ref 0.61–1.24)
GFR, Estimated: >60 mL/min
Glucose, Bld: 95 mg/dL (ref 70–99)
Potassium: 4.9 mmol/L (ref 3.5–5.1)
Sodium: 141 mmol/L (ref 135–145)
Total Bilirubin: 0.9 mg/dL (ref 0.0–1.2)
Total Protein: 7.8 g/dL (ref 6.5–8.1)

## 2024-04-27 MED ORDER — OXYCODONE HCL 10 MG PO TABS
10.0000 mg | ORAL_TABLET | Freq: Four times a day (QID) | ORAL | 0 refills | Status: AC | PRN
  Filled 2024-04-27: qty 90, 23d supply, fill #0

## 2024-04-27 MED ORDER — OXYCODONE HCL 10 MG PO TABS: 10.0000 mg | ORAL_TABLET | Freq: Four times a day (QID) | ORAL | 0 refills | Status: DC | PRN

## 2024-04-27 NOTE — Progress Notes (Signed)
 [facility]  Patient presented to clinic, with sister, for his scheduled appointment with PA-C Johnston. I introduced myself and provided them with my direct contact information. Patient and sister were both encouraged to call me with any questions/concerns they may have.  Colene KYM Raider, RN, BSN Oncology Nurse Navigator, [facility] 04/27/2024 4:20 PM

## 2024-04-27 NOTE — Progress Notes (Signed)
 " Rapid Diagnostic Service for Malignancy [facility] Telephone:([phone]   [phone]  INITIAL CONSULTATION:  Patient Care Team: Patient, No Pcp Per as PCP - General (General Practice) Hilty, Vinie BROCKS, MD as PCP - Cardiology (Cardiology) Army Dallas NOVAK, MD (Inactive) as Consulting Physician (Cardiothoracic Surgery) Golden Forestine BROCKS, RN as Oncology Nurse Navigator (Medical Oncology)  CHIEF COMPLAINTS/PURPOSE OF CONSULTATION:  Right lung mass   HISTORY OF PRESENTING ILLNESS:  Victor [name] 70 y.o. male with medical history significant for CVA, hypertension, aortic aneurysm repair who presents to the rapid diagnostic clinic for evaluation for abnormal CT scan concerning for right lung mass.  He is accompanied by sister for this visit.  On review of the previous records, Victor [name] presented to the emergency room on 04/19/2024 due to right sided chest pain and worsening bilateral lower leg pain.  He underwent CTA of the chest, abdomen and pelvis that revealed a 3.3 x 3.2 x 3.4 cm heterogeneous nonenhancing low-attenuation lung mass concerning for malignancy.  On exam today, Victor [name] reports he continues to have pain in his mid right chest area that he rates as 8 out of 10 on a pain scale.  He is currently taking oxycodone -acetaminophen  7.5-325 mg with minimal relief.  He reports the pain is worse with movement and exertion.  He reports shortness of breath with exertion and occasionally at rest such as talking for a long period.  He has chronic low back pain bilateral leg pain that has worsened recently.  He denies nausea, vomiting or abdominal pain.  His bowel habits are unchanged without recurrent episodes of diarrhea or constipation.  He denies easy bruising or signs of active bleeding.  Patient denies fevers, chills, night sweats, cough, headaches or dizziness.  He has no other complaints.  Rest of the 10 point ROS.  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   Stroke [facility])    Thoracic aortic aneurysm 09/2014    SURGICAL HISTORY: Past Surgical History:  Procedure Laterality Date   ASCENDING AORTIC ROOT REPLACEMENT N/A 01/21/2017   Procedure: Replacement of Aortic Arch with circulatory arrest;  Surgeon: Army Dallas NOVAK, MD;  Location: [facility] OR;  Service: Open Heart Surgery;  Laterality: N/A;   CARDIAC CATHETERIZATION     FEMORAL-FEMORAL BYPASS GRAFT N/A 10/07/2014   Procedure: LEFT  FEMORAL ARTERY -RIGHT FEMORAL ARTERY BYPASS GRAFT USING X 30 CM HEMASHIELD GOLD GRAFT;  Surgeon: Krystal JULIANNA Doing, MD;  Location: [facility] OR;  Service: Vascular;  Laterality: N/A;   RIGHT/LEFT HEART CATH AND CORONARY ANGIOGRAPHY N/A 01/11/2017   Procedure: RIGHT/LEFT HEART CATH AND CORONARY ANGIOGRAPHY- Diagnostic Only;  Surgeon: Wonda Sharper, MD;  Location: [facility] INVASIVE CV LAB;  Service: Cardiovascular;  Laterality: N/A;   s/p thoracic aneurysm repair     STERNOTOMY N/A 01/21/2017   Procedure: REDO STERNOTOMY;  Surgeon: Army Dallas NOVAK, MD;  Location: [facility] OR;  Service: Open Heart Surgery;  Laterality: N/A;   TEE WITHOUT CARDIOVERSION N/A 01/21/2017   Procedure: TRANSESOPHAGEAL ECHOCARDIOGRAM (TEE);  Surgeon: Army Dallas NOVAK, MD;  Location: [facility] OR;  Service: Open Heart Surgery;  Laterality: N/A;   THORACIC AORTIC ANEURYSM REPAIR N/A 10/06/2014   Procedure: THORACIC ASCENDING ANEURYSM REPAIR (AAA);  Surgeon: Dallas NOVAK Army, MD;  Location: [facility] OR;  Service: Open Heart Surgery;  Laterality: N/A;  hyportermia circulatory arrest and resuspension of aortic  valve   THORACIC AORTIC ENDOVASCULAR STENT GRAFT N/A 01/21/2017   Procedure: THORACIC AORTIC ENDOVASCULAR STENT GRAFT;  Surgeon: Serene Gaile ORN, MD;  Location: [facility] OR;  Service: Vascular;  Laterality: N/A;   THROMBECTOMY ILIAC ARTERY Left 03/08/2017   Procedure: THROMBECTOMY of Left subclavian artery;  Surgeon:  Eliza Lonni RAMAN, MD;  Location: [facility] OR;  Service: Vascular;  Laterality: Left;   VASCULAR SURGERY      SOCIAL HISTORY: Social History   Socioeconomic History   Marital status: Single    Spouse name: Not on file   Number of children: Not on file   Years of education: Not on file   Highest education level: Not on file  Occupational History   Not on file  Tobacco Use   Smoking status: Former    Current packs/day: 0.00    Types: Cigarettes, Cigars    Start date: 10/07/1979    Quit date: 10/07/2014    Years since quitting: 9.5   Smokeless tobacco: Never  Vaping Use   Vaping status: Never Used  Substance and Sexual Activity   Alcohol use: Yes    Comment: occasional   Drug use: No   Sexual activity: Not on file  Other Topics Concern   Not on file  Social History Narrative   Not on file   Social Drivers of Health   Tobacco Use: Medium Risk (04/27/2024)   Patient History    Smoking Tobacco Use: Former    Smokeless Tobacco Use: Never    Passive Exposure: Not on Actuary Strain: Not on file  Food Insecurity: No Food Insecurity (04/27/2024)   Epic    Worried About Programme Researcher, Broadcasting/film/video in the Last Year: Never true    Ran Out of Food in the Last Year: Never true  Transportation Needs: Unmet Transportation Needs (04/27/2024)   Epic    Lack of Transportation (Medical): Yes    Lack of Transportation (Non-Medical): Yes  Physical Activity: Not on file  Stress: Not on file  Social Connections: Not on file  Intimate Partner Violence: Not At Risk (04/27/2024)   Epic    Fear of Current or Ex-Partner: No    Emotionally Abused: No    Physically Abused: No    Sexually Abused: No  Depression (PHQ2-9): High Risk (04/27/2024)   Depression (PHQ2-9)    PHQ-2 Score: 15  Alcohol Screen: Not on file  Housing: Unknown (04/27/2024)   Epic    Unable to Pay for Housing in the Last Year: No    Number of Times Moved in the Last Year: Not on file    Homeless in the Last Year: No   Utilities: Not At Risk (04/27/2024)   Epic    Threatened with loss of utilities: No  Health Literacy: Not on file    FAMILY HISTORY: Family History  Problem Relation Age of Onset   Heart murmur Mother     ALLERGIES:  has no known allergies.  MEDICATIONS:  Current Outpatient Medications  Medication Sig Dispense Refill   aspirin  325 MG tablet Take 0.5 tablets (162.5 mg total) by mouth daily. 15 tablet 3   gabapentin  (NEURONTIN ) 300 MG capsule Take 2 capsules (600 mg total) by mouth 3 (three) times daily. (Patient not taking: Reported on 04/27/2024) 180 capsule 0   Oxycodone  HCl 10 MG TABS Take 1 tablet (10 mg total) by mouth every 6 (six) hours as needed. 90 tablet 0   No current facility-administered medications for this visit.  REVIEW OF SYSTEMS:   Constitutional: ( - ) fevers, ( - )  chills , ( - ) night sweats Eyes: ( - ) blurriness of vision, ( - ) double vision, ( - ) watery eyes Ears, nose, mouth, throat, and face: ( - ) mucositis, ( - ) sore throat Respiratory: ( - ) cough, ( - ) dyspnea, ( - ) wheezes Cardiovascular: ( - ) palpitation, ( - ) chest discomfort, ( - ) lower extremity swelling Gastrointestinal:  ( - ) nausea, ( - ) heartburn, ( - ) change in bowel habits Skin: ( - ) abnormal skin rashes Lymphatics: ( - ) new lymphadenopathy, ( - ) easy bruising Neurological: ( - ) numbness, ( - ) tingling, ( - ) new weaknesses Behavioral/Psych: ( - ) mood change, ( - ) new changes  All other systems were reviewed with the patient and are negative.  PHYSICAL EXAMINATION: ECOG PERFORMANCE STATUS: 1 - Symptomatic but completely ambulatory  Vitals:   04/27/24 1405 04/27/24 1410  BP: (!) 160/77 133/73  Pulse: (!) 102 (!) 103  Resp: 18   Temp: 98.4 F (36.9 C)   SpO2: 100%    Filed Weights   04/27/24 1405  Weight: 145 lb (65.8 kg)    GENERAL: well appearing male in NAD  SKIN: skin color, texture, turgor are normal, no rashes or significant lesions EYES:  conjunctiva are pink and non-injected, sclera clear LYMPH:  no palpable lymphadenopathy in the cervical or supraclavicular lymph nodes.  LUNGS: clear to auscultation and percussion with normal breathing effort HEART: regular rate & rhythm and no murmurs and no lower extremity edema Musculoskeletal: no cyanosis of digits and no clubbing  PSYCH: alert & oriented x 3, fluent speech NEURO: no focal motor/sensory deficits  LABORATORY DATA:  I have reviewed the data as listed    Latest Ref Rng & Units 04/27/2024    3:26 PM 04/19/2024    8:34 AM 10/17/2023    1:23 PM  CBC  WBC 4.0 - 10.5 K/uL 6.4  8.8  6.0   Hemoglobin 13.0 - 17.0 g/dL 84.5  85.1  85.4   Hematocrit 39.0 - 52.0 % 48.1  46.8  45.9   Platelets 150 - 400 K/uL 268  278  260        Latest Ref Rng & Units 04/27/2024    3:26 PM 04/19/2024    1:22 PM 04/19/2024    8:34 AM  CMP  Glucose 70 - 99 mg/dL 95   896   BUN 8 - 23 mg/dL 19   16   Creatinine 9.38 - 1.24 mg/dL 8.96   8.96   Sodium 864 - 145 mmol/L 141   137   Potassium 3.5 - 5.1 mmol/L 4.9   5.1   Chloride 98 - 111 mmol/L 106   103   CO2 22 - 32 mmol/L 25   22   Calcium  8.9 - 10.3 mg/dL 9.5   8.9   Total Protein 6.5 - 8.1 g/dL 7.8  6.9    Total Bilirubin 0.0 - 1.2 mg/dL 0.9  0.8    Alkaline Phos 38 - 126 U/L 94  95    AST 15 - 41 U/L 19  23    ALT 0 - 44 U/L 11  9       RADIOGRAPHIC STUDIES: I have personally reviewed the radiological images as listed and agreed with the findings in the report. CT Angio Chest/Abd/Pel for Dissection W and/or Wo Contrast Result  Date: 04/19/2024 CLINICAL DATA:  Suspected acute aortic syndrome. EXAM: CT ANGIOGRAPHY CHEST, ABDOMEN AND PELVIS TECHNIQUE: Non-contrast CT of the chest was initially obtained. Multidetector CT imaging through the chest, abdomen and pelvis was performed using the standard protocol during bolus administration of intravenous contrast. Multiplanar reconstructed images and MIPs were obtained and reviewed to evaluate  the vascular anatomy. RADIATION DOSE REDUCTION: This exam was performed according to the departmental dose-optimization program which includes automated exposure control, adjustment of the mA and/or kV according to patient size and/or use of iterative reconstruction technique. CONTRAST:  OMNIPAQUE  IOHEXOL  350 MG/ML SOLN COMPARISON:  March 07, 2017 FINDINGS: CTA CHEST FINDINGS Cardiovascular: Preferential opacification of the thoracic aorta. The descending thoracic aorta measures 3.7 cm in diameter. There is extensive aortic stenting seen extending from the proximal portion of the aortic arch throughout the suprarenal abdominal aorta. A stable appearing dissection is seen extending from the distal descending thoracic aorta to the infrarenal portion of the abdominal aorta. A 5.6 cm x 3.0 cm partially thrombosed false lumen is noted along the anterolateral aspect of the descending thoracic aorta on the right. An additional 2.3 cm x 1.6 cm false lumen seen at the level of the right renal artery. Normal heart size. No pericardial effusion. Mediastinum/Nodes: No enlarged mediastinal, hilar, or axillary lymph nodes. Thyroid gland, trachea, and esophagus demonstrate no significant findings. Lungs/Pleura: There is evidence of paraseptal and centrilobular emphysematous lung disease. A 3.3 cm x 3.2 cm x 3.4 cm heterogeneous nonenhancing low-attenuation lung mass is seen within the lateral aspect of the right upper lobe. There is a mild amount of adjacent posterolateral right upper lobe linear scarring and/or atelectasis. Mild right middle lobe and bilateral lower lobe scarring and/or atelectasis is seen, right greater than left. A trace amount of pleural fluid is seen on the right. No pneumothorax is identified. Musculoskeletal: No chest wall abnormality. No acute or significant osseous findings. Review of the MIP images confirms the above findings. CTA ABDOMEN AND PELVIS FINDINGS VASCULAR Aorta: Extensive stenting of  the suprarenal abdominal aorta, as described above with stable aortic dissection to involve the infrarenal abdominal aorta. Celiac: Patent without evidence of aneurysm, dissection, vasculitis or significant stenosis. SMA: Patent without evidence of aneurysm, dissection, vasculitis or significant stenosis. Renals: Both renal arteries are patent without evidence of aneurysm, dissection, vasculitis, fibromuscular dysplasia or significant stenosis. IMA: Patent without evidence of aneurysm, dissection, vasculitis or significant stenosis. Inflow: Patent bilateral iliac arteries are seen without evidence of significant stenosis. 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 Veins: No obvious venous abnormality within the limitations of this arterial phase study. Review of the MIP images confirms the above findings. NON-VASCULAR Hepatobiliary: No focal liver abnormality is seen. Multiple small gallstones are seen within the gallbladder lumen without evidence of pericholecystic inflammation, gallbladder wall thickening, or biliary dilatation. Pancreas: The head of the pancreas is enlarged (measures 2.9 cm x 4.7 cm. Enlargement of the adjacent portion of the body of the pancreas is also noted (3.3 cm x 3.6 cm. There is no evidence of pancreatic ductal dilatation or peripancreatic inflammatory changes. Spleen: Normal in size without focal abnormality. Adrenals/Urinary Tract: Adrenal glands are unremarkable. Kidneys are normal in size, without renal calculi or hydronephrosis. A 1.3 cm diameter simple cyst is seen along the posterolateral aspect of the mid left kidney. Bladder is unremarkable. Stomach/Bowel: Stomach is within normal limits. Appendix appears normal. No evidence  of bowel wall thickening, distention, or inflammatory changes. Lymphatic: No abnormal abdominal or pelvic lymph nodes are identified. Reproductive: The prostate gland  is mildly enlarged. Other: A predominantly stable 3.4 cm x 4.6 cm left inguinal hernia is seen. This contains fat and a segment of proximal sigmoid colon. Musculoskeletal: No acute or significant osseous findings. Review of the MIP images confirms the above findings. IMPRESSION: 1. Stable descending thoracic aortic dissection extending to the infrarenal portion of the abdominal aorta. 2. Extensive thoracic aortic stenting, as described above. 3. 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 4. 3.3 cm x 3.2 cm x 3.4 cm heterogeneous nonenhancing low-attenuation lung mass within the lateral aspect of the right upper lobe, concerning for malignancy. 5. Enlarged head and body of the pancreas which may represent sequelae associated with pancreatitis. Correlation with pancreatic enzymes is recommended. MRI follow-up is also recommended to exclude the presence of an underlying neoplastic process. 6. Cholelithiasis. 7. Stable left inguinal hernia containing fat and a segment of proximal sigmoid colon. 8. Aortic atherosclerosis. Electronically Signed   By: Suzen Dials M.D.   On: 04/19/2024 11:50   DG Chest Portable 1 View Result Date: 04/19/2024 CLINICAL DATA:  Chest pain. EXAM: PORTABLE CHEST 1 VIEW COMPARISON:  04/21/2021 and CT chest 03/07/2017. FINDINGS: Trachea is midline. Heart is enlarged. Aortic endovascular stent graft. 2.9 x 4.5 cm mass in the right upper lobe, new. Mild bibasilar interstitial prominence with subsegmental atelectasis in the right lung base. Pleural thickening in the right lung with possible associated nodularity. No definite pleural fluid. IMPRESSION: 1. Right upper lobe mass, highly worrisome for primary bronchogenic carcinoma. The possibility of a fluid-filled bullous lesion is not entirely excluded. Recommend CT chest with contrast in further evaluation, as clinically indicated. 2. Pleural thickening and possible nodularity  in the right hemithorax, also worrisome for malignancy. 3. Bibasilar interstitial prominence may be due to aspiration or an infectious bronchiolitis. Electronically Signed   By: Newell Eke M.D.   On: 04/19/2024 09:14    ASSESSMENT & PLAN Victor [name] is a 69 y.o. male who presents to the clinic for evaluation for right lung mass.  #Right upper lobe mass: -- Seen on CT scan from 04/19/2024, concerning for underlying malignancy. -- Labs today to check CBC and CMP. -- Requested urgent consultation with pulmonology to assess for bronchoscopy with biopsies. -- Will order a PET CT scan for staging --RTC once workup is complete  #Right chest pain: --Like secondary to underlying right upper lobe mass. -- Sent prescription for oxycodone  10 mg with directions to take 1 tablet every 6 hours as needed for pain.  #Pancreatic abnormality: -- CT scan from 04/19/2024 showed enlarged head and body of the pancreas which may be related to pancreatitis.  Radiologist recommended MRI to exclude malignant process. -- Will order MRI of the abdomen to further assess.  Orders Placed This Encounter  Procedures   CBC with Differential (Cancer Center Only)    Standing Status:   Future    Number of Occurrences:   1    Expiration Date:   04/27/2025   CMP (Cancer Center only)    Standing Status:   Future    Number of Occurrences:   1    Expiration Date:   04/27/2025    All questions were answered. The patient knows to call the clinic with any problems, questions or concerns.  I have spent a total of 60 minutes minutes of  face-to-face and non-face-to-face time, preparing to see the patient, obtaining and/or reviewing separately obtained history, performing a medically appropriate examination, counseling and educating the patient, ordering medications/tests/procedures, referring and communicating with other health care professionals, documenting clinical information in the electronic health record,  independently interpreting results and communicating results to the patient, and care coordination.   Johnston Police, PA-C Department of Hematology/Oncology [facility] Phone: [phone]   "

## 2024-04-27 NOTE — Patient Instructions (Signed)
 Rapid Diagnostic Service Visit Discharge Information and Instructions  Thank you for choosing [facility] for your healthcare needs.  Below is a summary of today's discussion, along with our contact information and an outline of what to expect next.  Reason for Visit:  Right lung mass, pancreatic changes   Proposed Diagnostic Care Plan: Labs  PET/CT MRI abdomen Referral to pulmonology to evaluate for bronchoscopy with biopsy  What to Expect: - Generally, when lab tests are ordered the results can take up to 1 week for results to be available.  At that point, we will contact you to discuss your results with you.  Unless there is a critical result, we will typically wait for all of your lab results to be available before contacting you. - If a biopsy is part of your Care Plan, those results can take on average 7-10 days to result.  Once results are available, we will contact you to discuss your pathology results and any next steps. - If you have additional imaging ordered, such as a CT Scan, MRI, Ultrasound, Bone Scan, or PET scan, your imaging will need to be authorized then scheduled with the earliest available appointment.  You may be asked to travel to another hospital within [facility] who has a sooner availability, please consider doing so if asked. - If you use MyChart, your results will be available to you in the MyChart portal.  Your provider will be in touch with you as soon as all of your results are available to be discussed.  Your Diagnostic Clinic Provider:  Johnston Police PA-C and Dr. Tina, contact number [phone] Your Diagnostic Navigator:  Colene Raider RN, contact number [phone]  If you or your caregiver have number blocking on your cell phones, please ensure the cancer center's numbers are not blocked.  If you are not a registered MyChart user, please consider enrolling in MyChart to receive your test results and visit notes.  You can also access your discharge  instructions electronically.  MyChart also gives you an electronic means to communicate with your Care Team instead of needing to call in to the cancer center.  We appreciate you trusting us  with your healthcare and look forward to partnering with you as we work to uncover what your potential diagnosis may be.  Please do not hesitate to reach out at any point with questions or concerns.

## 2024-04-27 NOTE — Telephone Encounter (Signed)
 Per Johnston Police PA, pt is needing an urgent consult to address lung mass seen on CT angio 04/19/24. Called and left pt VM. Johnston states she will be seeing patient today 04/27/2024 at 2pm for OV, she states she will give pt the appt info and our number to call if needed.  Appt with SG scheduled for patient on 05/02/2024 at 0830.

## 2024-04-30 ENCOUNTER — Encounter: Payer: Self-pay | Admitting: Medical Oncology

## 2024-05-01 ENCOUNTER — Telehealth: Payer: Self-pay

## 2024-05-01 NOTE — Telephone Encounter (Signed)
 Patient's sister Hermine called back. Spoke with [name] and advised she could call pt's insurance and begin the process of setting up transportation. Phone number and member ID given off of pts insurance card. Hermine states the pt will have family help with appt on 05/02/2024 OV with SG but will need transportation for imaging appts coming up. Hermine aware of time and date of appts and will call back if needed.

## 2024-05-01 NOTE — Telephone Encounter (Signed)
 Returned call. LVM. No transportation needs discussed when appt was scheduled with Oncology.   Copied from CRM [id number]. Topic: General - Other >> May 01, 2024  8:27 AM Corean SAUNDERS wrote: Reason for CRM: Patients sister Marjorie states someone was supposed to call her back regarding transportation help for the patients appointment tomorrow 05/02/24 with Lauraine Lites.  Please call Marjorie back to advise at [phone]

## 2024-05-02 ENCOUNTER — Telehealth: Payer: Self-pay | Admitting: Acute Care

## 2024-05-02 ENCOUNTER — Telehealth: Payer: Self-pay

## 2024-05-02 ENCOUNTER — Encounter: Payer: Self-pay | Admitting: Medical Oncology

## 2024-05-02 ENCOUNTER — Encounter: Payer: Self-pay | Admitting: Acute Care

## 2024-05-02 ENCOUNTER — Ambulatory Visit: Admitting: Acute Care

## 2024-05-02 NOTE — Telephone Encounter (Signed)
 Called and left HIPAA compliant voicemail on approved home phone number regarding missed appointment this morning with Lauraine Lites, NP. Patient has transportation issues which may influence when he can reschedule appointment. Kelly Side RN assisted patient and family with identifying transportation resources through insurance on 05/01/2024. Advised patient to call clinic back to get this appointment rescheduled.

## 2024-05-02 NOTE — Progress Notes (Signed)
 Rapid Diagnostic Clinic  LVM with patient regarding a vm received from his sister Zealand Boyett this morning. Ms. Marko stated that patient was having appointment problems due to transportation issues as well as prescription issues. I requested patient to call be back to provide more information, since Ms. Marko is not on patient's emergency contact or HIPAA forms.  Call back number provided.   Noted in chart that patient missed his Pulmonary appointment yesterday.  Colene KYM Raider, RN, BSN Oncology Nurse Navigator, Rapid Diagnostic Services 05/02/2024 9:37 AM

## 2024-05-02 NOTE — Telephone Encounter (Signed)
 Called and spoke with Victor [name], pt's sister, regarding transportation. Victor [name] called [employer] on 05/01/2024 and was informed the pt is not in their system. She states her other sister (also the pts other sister) informed her the pt has [employer]. However, there isnt information in the pt's chart showing he has [employer]. Victor [name] Victor [name] she could speak with their other sister to get the [facility] information from her if she has it, to potentially help with the pt's transportation. I also gave Victor [name] the pts [employer] card information that was scanned into the pt's chart in 2019, I am unsure if the pt is still active with mcd. I gave her the mcd number: [phone] and his recipient ID #: [id number]. I informed Victor [name] I am unsure if the pt has this coverage still but could try.   Victor [name] also stated the pt has taken excessive amounts of oxycodone  that was prescribed on 04/27/2024 (see previous note in documentation thread). She states the pt is incoherent, is not alert, nor oriented, and is unable to arouse him enough to speak on the phone. I advised her that his overdose of the prescribed opioid (Oxycodone  10mg  tabs) could lead to respiratory depression/arrest and possible death. I advised she should call EMS to evaluate and transport to the hospital if necessary. She agreed to do this. Also during conversation she stated the pt has a court appearance on 05/03/2024 morning and will not make the appearence due to his current state. I emphasized the importance of calling EMS. She states she will call EMS then will call mcd and also try to figure out the [facility] information from their sister. I advised her to call back if needed. Will follow up to assure transportation is secured for upcoming appts.   Will send to Victor [name] and Victor [name] as RICK.

## 2024-05-02 NOTE — Telephone Encounter (Signed)
 Pt. Was a no show for his appointment today. He needs to be set up for bronch with biopsy. We have been trying to get in touch with both him and his sister Hermine. We saw the VM for the transportation issues. We did get  in touch with his sister. She states that he has gone through 90 oxycodone  tabs in 5 days, ( Prescribed 04/27/2024) and she was unable to get him up this morning to come to the appointment. Per his sister, he has been buying drugs on the street, and has a high tolerance for opioids. She is trying to help him, but is growing frustrated as he is in [place], and she is in [place].  We have him rescheduled for 05/08/2024. We are giving the sister info about transportation assistance. I wanted to make sure [place] was aware of the overuse of oxy. Please let me or [place] NP know if you have any questions. Thanks

## 2024-05-07 ENCOUNTER — Other Ambulatory Visit: Payer: Self-pay

## 2024-05-07 ENCOUNTER — Encounter (HOSPITAL_COMMUNITY): Payer: Self-pay

## 2024-05-07 ENCOUNTER — Emergency Department (HOSPITAL_COMMUNITY)

## 2024-05-07 ENCOUNTER — Emergency Department (HOSPITAL_COMMUNITY)
Admission: EM | Admit: 2024-05-07 | Discharge: 2024-05-07 | Disposition: A | Discharge status: Home / Self Care | Attending: Emergency Medicine | Admitting: Emergency Medicine

## 2024-05-07 ENCOUNTER — Ambulatory Visit (HOSPITAL_COMMUNITY)
Admission: RE | Admit: 2024-05-07 | Discharge: 2024-05-07 | Disposition: A | Discharge status: Home / Self Care | Source: Ambulatory Visit | Attending: Physician Assistant | Admitting: Physician Assistant

## 2024-05-07 DIAGNOSIS — I2510 Atherosclerotic heart disease of native coronary artery without angina pectoris: Secondary | ICD-10-CM | POA: Diagnosis not present

## 2024-05-07 DIAGNOSIS — Z7982 Long term (current) use of aspirin: Secondary | ICD-10-CM | POA: Diagnosis not present

## 2024-05-07 DIAGNOSIS — R0602 Shortness of breath: Secondary | ICD-10-CM | POA: Diagnosis present

## 2024-05-07 DIAGNOSIS — C3491 Malignant neoplasm of unspecified part of right bronchus or lung: Secondary | ICD-10-CM | POA: Insufficient documentation

## 2024-05-07 DIAGNOSIS — R079 Chest pain, unspecified: Secondary | ICD-10-CM

## 2024-05-07 DIAGNOSIS — I10 Essential (primary) hypertension: Secondary | ICD-10-CM | POA: Insufficient documentation

## 2024-05-07 DIAGNOSIS — R918 Other nonspecific abnormal finding of lung field: Secondary | ICD-10-CM | POA: Diagnosis present

## 2024-05-07 HISTORY — DX: Malignant (primary) neoplasm, unspecified: C80.1

## 2024-05-07 LAB — BASIC METABOLIC PANEL WITH GFR
Anion gap: 8 (ref 5–15)
BUN: 10 mg/dL (ref 8–23)
CO2: 27 mmol/L (ref 22–32)
Calcium: 8.8 mg/dL — ABNORMAL LOW (ref 8.9–10.3)
Chloride: 105 mmol/L (ref 98–111)
Creatinine, Ser: 0.94 mg/dL (ref 0.61–1.24)
GFR, Estimated: >60 mL/min
Glucose, Bld: 108 mg/dL — ABNORMAL HIGH (ref 70–99)
Potassium: 4.9 mmol/L (ref 3.5–5.1)
Sodium: 140 mmol/L (ref 135–145)

## 2024-05-07 LAB — GLUCOSE, CAPILLARY: Glucose-Capillary: 105 mg/dL — ABNORMAL HIGH (ref 70–99)

## 2024-05-07 LAB — CBC
HCT: 46.7 % (ref 39.0–52.0)
Hemoglobin: 14.8 g/dL (ref 13.0–17.0)
MCH: 28.2 pg (ref 26.0–34.0)
MCHC: 31.7 g/dL (ref 30.0–36.0)
MCV: 89.1 fL (ref 80.0–100.0)
Platelets: 267 K/uL (ref 150–400)
RBC: 5.24 MIL/uL (ref 4.22–5.81)
RDW: 13.4 % (ref 11.5–15.5)
WBC: 7.6 K/uL (ref 4.0–10.5)
nRBC: 0 % (ref 0.0–0.2)

## 2024-05-07 LAB — HEPATIC FUNCTION PANEL
ALT: 10 U/L (ref 0–44)
AST: 15 U/L (ref 15–41)
Albumin: 3.7 g/dL (ref 3.5–5.0)
Alkaline Phosphatase: 87 U/L (ref 38–126)
Bilirubin, Direct: 0.2 mg/dL (ref 0.0–0.2)
Indirect Bilirubin: 0.3 mg/dL (ref 0.3–0.9)
Total Bilirubin: 0.5 mg/dL (ref 0.0–1.2)
Total Protein: 7.1 g/dL (ref 6.5–8.1)

## 2024-05-07 LAB — TROPONIN T, HIGH SENSITIVITY
Troponin T High Sensitivity: 64 ng/L — ABNORMAL HIGH (ref 0–19)
Troponin T High Sensitivity: 53 ng/L — ABNORMAL HIGH (ref 0–19)

## 2024-05-07 LAB — LIPASE, BLOOD: Lipase: 18 U/L (ref 11–51)

## 2024-05-07 MED ORDER — HYDROMORPHONE HCL 1 MG/ML IJ SOLN
0.5000 mg | Freq: Once | INTRAMUSCULAR | Status: AC
  Administered 2024-05-07: 0.5 mg via INTRAVENOUS
  Filled 2024-05-07: qty 1

## 2024-05-07 MED ORDER — FLUDEOXYGLUCOSE F - 18 (FDG) INJECTION
7.2200 | Freq: Once | INTRAVENOUS | Status: AC
  Administered 2024-05-07: 7.22 via INTRAVENOUS

## 2024-05-07 MED ORDER — ONDANSETRON HCL 4 MG/2ML IJ SOLN
4.0000 mg | Freq: Once | INTRAMUSCULAR | Status: AC
  Administered 2024-05-07: 4 mg via INTRAVENOUS
  Filled 2024-05-07: qty 2

## 2024-05-07 MED ORDER — OXYCODONE HCL 5 MG PO TABS
10.0000 mg | ORAL_TABLET | Freq: Once | ORAL | Status: AC
  Administered 2024-05-07: 10 mg via ORAL
  Filled 2024-05-07: qty 2

## 2024-05-07 NOTE — Discharge Instructions (Signed)
 It was a pleasure taking care of you today. Based on your history and physical exam as well as your labs and imaging I feel you are safe for discharge.  Please follow-up with your cancer provider tomorrow.  They will be able to further answer your questions regarding your imaging, please go to your MRI tomorrow as well.  Please also ask your cancer provider about a refill of your home pain medications, I am unable to refill this today as you just received a prescription approximately 10 days ago.  If you experience any of the following symptoms including but not limited to fever, chills, worsening chest pain, shortness of breath, or other concerning symptom please return to the emergency department or seek further medical care.  If symptoms worsen recommend follow-up within 48 hours. At this time I do believe your chest pain may be coming from your cancer diagnosis.

## 2024-05-07 NOTE — ED Triage Notes (Signed)
 Patient has right sided chest pain that moves to the middle of his upper back. This began yesterday. No vomiting. Feels short of breath. 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

## 2024-05-07 NOTE — ED Provider Notes (Signed)
 " [place] EMERGENCY DEPARTMENT AT [facility] Provider Note   CSN: [id number] Arrival date & time: 05/07/24  9082     Patient presents with: Chest Pain   Victor [name] is a 69 y.o. male who presents to the  emergency department with a chief complaint of chest pain and shortness of breath.  Patient was seen for similar symptoms approximately 2 weeks ago on 04/19/2024.  Patient does have a significant past medical history of aortic dissection, hypertension, coronary artery disease, bilateral leg weakness, spinal cord infarction, chronic pain syndrome, history of stroke, as well as a recent diagnosis of what appears to be lung cancer.  Patient reports that after his previous visit he did follow-up with oncology as well as pulmonology, and patient had a PET scan earlier today and is scheduled for an MRI of his abdomen tomorrow.  Patient states that he has not yet been informed of his PET scan results.  Denies fever, chills, cough.  States that chest pain and shortness of breath have been present for multiple weeks, denies anything acutely worsening today.  Denies history of blood clot.  Patient's PET scan was completed this morning which showed findings consistent with a right upper lobe mass most likely primary bronchogenic carcinoma, as well as a right lateral chest wall nodule consistent with metastatic disease, there was no evidence of clear chest wall involvement.  Intense metabolic activity associated with the proximal aspect of the thoracic aortic stent graft, favored to represent benign inflammatory activity, as well as other chronic findings.    Chest Pain      Prior to Admission medications  Medication Sig Start Date End Date Taking? Authorizing Provider  aspirin  325 MG tablet Take 0.5 tablets (162.5 mg total) by mouth daily. 09/06/18   Whitfield Raisin, NP  gabapentin  (NEURONTIN ) 300 MG capsule Take 2 capsules (600 mg total) by mouth 3 (three) times daily. Patient not  taking: Reported on 04/27/2024 10/17/23   Sherrine Birmingham, DO  Oxycodone  HCl 10 MG TABS Take 1 tablet (10 mg total) by mouth every 6 (six) hours as needed. 04/27/24   Thayil, Irene T, PA-C    Allergies: Patient has no known allergies.    Review of Systems  Cardiovascular:  Positive for chest pain.    Updated Vital Signs BP 111/88   Pulse 90   Temp 97.9 F (36.6 C) (Oral)   Resp 10   Ht 5' 7 (1.702 m)   Wt 65.8 kg   SpO2 99%   BMI 22.71 kg/m   Physical Exam Vitals and nursing note reviewed.  Constitutional:      General: He is awake. He is not in acute distress.    Appearance: Normal appearance. He is well-developed. He is not ill-appearing, toxic-appearing or diaphoretic.  HENT:     Head: Normocephalic and atraumatic.  Eyes:     General: No scleral icterus. Neck:     Vascular: No JVD.  Cardiovascular:     Rate and Rhythm: Normal rate and regular rhythm.     Heart sounds: Murmur heard.  Pulmonary:     Effort: Pulmonary effort is normal. No tachypnea, accessory muscle usage or respiratory distress.     Breath sounds: No stridor. No decreased breath sounds, wheezing, rhonchi or rales.     Comments: Patient talking in full sentences on room air Chest:     Chest wall: Tenderness (Tenderness with palpation of R side of chest wall) present.  Abdominal:     Palpations:  Abdomen is soft.     Tenderness: There is no abdominal tenderness. There is no guarding.  Musculoskeletal:        General: Normal range of motion.     Cervical back: Normal range of motion.     Right lower leg: No edema.     Left lower leg: No edema.     Comments: Bilateral calves tender to palpation (chronic), no swelling or redness  Skin:    General: Skin is warm.     Capillary Refill: Capillary refill takes less than 2 seconds.  Neurological:     General: No focal deficit present.     Mental Status: He is alert and oriented to person, place, and time.  Psychiatric:        Mood and Affect: Mood normal.         Behavior: Behavior normal. Behavior is cooperative.     (all labs ordered are listed, but only abnormal results are displayed) Labs Reviewed  BASIC METABOLIC PANEL WITH GFR - Abnormal; Notable for the following components:      Result Value   Glucose, Bld 108 (*)    Calcium  8.8 (*)    All other components within normal limits  TROPONIN T, HIGH SENSITIVITY - Abnormal; Notable for the following components:   Troponin T High Sensitivity 64 (*)    All other components within normal limits  TROPONIN T, HIGH SENSITIVITY - Abnormal; Notable for the following components:   Troponin T High Sensitivity 53 (*)    All other components within normal limits  CBC  HEPATIC FUNCTION PANEL  LIPASE, BLOOD    EKG: EKG Interpretation Date/Time:  Monday May 07 2024 09:32:51 EST Ventricular Rate:  82 PR Interval:  172 QRS Duration:  84 QT Interval:  396 QTC Calculation: 463 R Axis:   25  Text Interpretation: Sinus rhythm Left atrial enlargement Probable anterior infarct, old Confirmed by Pamella Sharper [phone]) on 05/07/2024 11:25:51 AM  Radiology: [facility] Chest 2 View Result Date: 05/07/2024 CLINICAL DATA:  Chest pain. EXAM: CHEST - 2 VIEW COMPARISON:  04/19/2024 FINDINGS: Stent graft involving the aortic arch and descending thoracic aorta is grossly stable. Again noted is a rounded opacity in the right upper lung compatible with patient's known mass measuring roughly 4.5 cm. Focal lateral density along the right mid chest compatible with the known subpleural lesion. Probable nipple shadow in the right lower chest. No new airspace disease or pulmonary edema. Heart size is within normal limits. Prior median sternotomy. No large pleural effusions. IMPRESSION: 1. No acute cardiopulmonary disease. 2. Stable appearance of the right lung mass and right subpleural lesion. 3. Stable appearance of the thoracic aortic stent graft. Electronically Signed   By: Juliene Balder M.D.   On: 05/07/2024 11:06   NM PET  Image Initial (PI) Skull Base To Thigh (F-18 FDG) Result Date: 05/07/2024 EXAM: PET AND CT SKULL BASE TO MID THIGH 05/07/2024 10:15:00 AM TECHNIQUE: RADIOPHARMACEUTICAL: 7.22 mCi F-18 FDG Uptake time 60 minutes. Glucose level 105 mg/dl. PET imaging was acquired from the base of the skull to the mid thighs. Non-contrast enhanced computed tomography was obtained for attenuation correction and anatomic localization. COMPARISON: CT 04/19/2024. 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 No metabolically active cervical lymphadenopathy. CHEST: Hypermetabolic mass in the right upper lobe measures 3.5  cm with SUV max equal 12.4. Hypermetabolic subpleural nodule on the right lateral chest wall measures 2.3 x 1.1 cm (image 74) with intense metabolic activity (SUV max equal 13.2). No hypermetabolic right hilar lymph nodes or mediastinal lymph nodes. There is intense metabolic activity associated with the proximal aspect of the thoracic aortic stent graft which is favored inflammatory activity. Midline sternotomy noted. No metabolically active lymphadenopathy. ABDOMEN AND PELVIS: Large left inguinal hernia with extensive small bowel and mesentery extending into the left hemiscrotum. No evidence of bowel obstruction. Incidental finding of fem fem bypass graft. Urine contaminations in the groin region. Physiologic activity within the gastrointestinal and genitourinary systems. No metabolically active lymphadenopathy. BONES AND SOFT TISSUE: No metabolically active involvement of the adjacent chest wall or ribs beyond the described subpleural nodule. No metabolically active aggressive osseous lesion. IMPRESSION: 1. Hypermetabolic right upper lobe mass measuring 3.5 cm consistent with primary bronchogenic  carcinoma 2. Hypermetabolic right lateral chest wall subpleural nodule consistent with metastatic disease. No clear chest wall involvement. 3. Intense metabolic activity associated with the proximal aspect of the thoracic aortic stent graft, favored to represent benign inflammatory activity. 4. Absent metabolic activity of the left vocal cord is favored to be nonmalignant as above. 5. Large left inguinal hernia containing small bowel and mesentery extending into the left hemiscrotum, without evidence of bowel obstruction. Electronically signed by: Norleen Boxer MD MD 05/07/2024 10:48 AM EST RP Workstation: [id number]     Procedures   Medications Ordered in the ED  HYDROmorphone  (DILAUDID ) injection 0.5 mg (has no administration in time range)  oxyCODONE  (Oxy IR/ROXICODONE ) immediate release tablet 10 mg (10 mg Oral Given 05/07/24 1041)  HYDROmorphone  (DILAUDID ) injection 0.5 mg (0.5 mg Intravenous Given 05/07/24 1349)  ondansetron  (ZOFRAN ) injection 4 mg (4 mg Intravenous Given 05/07/24 1349)    Clinical Course as of 05/07/24 1517  Mon May 07, 2024  1214 Troponin T High Sensitivity(!): 64 [EM]    Clinical Course User Index [EM] Cleotilde Perkins, DO                                 Medical Decision Making Amount and/or Complexity of Data Reviewed Labs: ordered. Radiology: ordered.  Risk Prescription drug management.   Patient presents to the ED for concern of chest pain, this involves an extensive number of treatment options, and is a complaint that carries with it a high risk of complications and morbidity.  The differential diagnosis includes metastatic cancer, pulmonary embolism, ACS, pneumonia, aortic dissection, pneumothorax, etc.   Co morbidities that complicate the patient evaluation  aortic dissection, hypertension, coronary artery disease, bilateral leg weakness, spinal cord infarction, chronic pain syndrome, history of stroke, as well as a recent diagnosis of what appears to be  lung cancer   Additional history obtained:  Note reviewed from previous emergency department visit on 04/19/2024, reviewed previous negative dissection study, also reviewed multiple notes from pulmonology as well as oncology since discharge from emergency department previously   Lab Tests:  I Ordered, and personally interpreted labs.  The pertinent results include: CBC unremarkable, BMP largely unremarkable other than very mild hypercalcemia, hepatic function panel unremarkable, lipase not elevated, troponins 64 and 53   Imaging Studies ordered:  I ordered imaging studies including x-ray I independently visualized and interpreted imaging which showed no acute cardiopulmonary disease, stable appearance of the right lung mass and right subpleural lesion, stable appearance of the thoracic aortic stent graft  I agree with the radiologist interpretation   Cardiac Monitoring:  The patient was maintained on a cardiac monitor.  I personally viewed and interpreted the cardiac monitored which showed an underlying rhythm of: Sinus rhythm   Medicines ordered and prescription drug management:  I ordered medication including oxycodone , Dilaudid , Zofran  for pain Reevaluation of the patient after these medicines showed that the patient improved I have reviewed the patients home medicines and have made adjustments as needed   Test Considered:  CT PE study: Deferred at this time as patient is not tachycardic, not hypoxic, chest pain and shortness of breath have been going on for multiple weeks, patient also had a negative dissection study completed approximately 2 weeks ago, no acute calf redness, pain, or swelling   Critical Interventions:  None   Problem List / ED Course:  69 year old male, extensive past medical history including recent diagnosis of what appears to be primary lung cancer, also has history of aortic dissection repair, presents for chest pain and shortness of breath On  physical exam patient is well-appearing with stable vital signs, patient is talking full sentences on room air, patient was initially triaged by myself and chest pain orders were placed Chest pain workup overall reassuring, troponins are flat and downtrending, other lab work is reassuring, patient did have a PET scan this morning and was unaware of the results, I did inform the patient that it appears that he may have some metastatic disease from his primary lung cancer, I instructed the patient to follow-up with his oncologist as well as pulmonologist and go to his scheduled MRI tomorrow, patient understanding of this Patient states that unfortunately he lost his oxycodone  prescription, per PDMP patient received 90 tablets back on January 2, I instructed the patient to follow-up with his primary care provider/specialist that prescribes this medication, as I am unable to give a refill today, patient understanding of this  Will however give pain meds while in the ED to control pain At this time believe chest pain is likely due to metastatic lung cancer, doubt ACS given flat troponins and EKG today, clinically doubt PE as patient is not tachycardic and states that chest pain and shortness of breath have been going on for multiple weeks, calf pain is also bilateral and chronic per patient, no new redness or swelling, no evidence of hypoxia Patient reassessed after pain control medication and feeling improved, patient is stable for follow-up outpatient with specialist as well as primary care provider Return precautions given Patient discharged  Most likely diagnosis at this time is chest pain possibly due to metastatic primary lung cancer, patient has good follow-up with pulmonology as well as oncology with an appointment tomorrow, patient vital signs are stable   Reevaluation:  After the interventions noted above, I reevaluated the patient and found that they have :improved   Social Determinants of  Health:  No PCP, patient recently got out of jail   Dispostion:  After consideration of the diagnostic results and the patients response to treatment, I feel that the patient would benefit from discharge and outpatient therapy as described.       Final diagnoses:  Chest pain, unspecified type  Shortness of breath  Primary malignant neoplasm of right lung metastatic to other site [facility])    ED Discharge Orders     None          Janetta Terrall [place], [place] 05/07/24 1517  "

## 2024-05-07 NOTE — ED Provider Triage Note (Addendum)
 Emergency Medicine Provider Triage Evaluation Note  Victor [name] , a 69 y.o. male  was evaluated in triage.  Pt complains of chest pain as well as shortness of breath. Patient has significant history of thoracic aortic dissection , aortic stenting, fem-fem bypass graft, as well a other chronic past medical history. States that he has had persistent chest pain as well as shortness of breath. It appears patient was seen for similar on 04/19/24 and had a dissection study completed. Denies syncope.   Review of Systems  Positive: Chest pain, shortness of breath Negative: Fever, chills, nausea, vomiting, abdominal pain  Physical Exam  BP (!) 184/98   Pulse 86   Temp 98 F (36.7 C) (Oral)   Resp 17   Ht 5' 7 (1.702 m)   Wt 65.8 kg   SpO2 98%   BMI 22.71 kg/m  Gen:   Awake, no distress   Resp:  Normal effort, patient talking in full sentences on room air MSK:   Moves extremities without difficulty  Other:    Medical Decision Making  Medically screening exam initiated at 10:40 AM.  Appropriate orders placed.  Victor [name] was informed that the remainder of the evaluation will be completed by another provider, this initial triage assessment does not replace that evaluation, and the importance of remaining in the ED until their evaluation is complete.  Orders: CBC, BMP, hepatic function panel, lipase, EKG, troponin, CXR   Will hold on further imaging at this time due to recent negative dissection study, vitals stable in triage    Victor Terrall FALCON, PA-C 05/07/24 1043    Victor [name], [place] 05/07/24 1043

## 2024-05-08 ENCOUNTER — Encounter (HOSPITAL_COMMUNITY): Payer: Self-pay | Admitting: Internal Medicine

## 2024-05-08 ENCOUNTER — Observation Stay (HOSPITAL_COMMUNITY)

## 2024-05-08 ENCOUNTER — Other Ambulatory Visit: Payer: Self-pay

## 2024-05-08 ENCOUNTER — Emergency Department (HOSPITAL_COMMUNITY)

## 2024-05-08 ENCOUNTER — Observation Stay (HOSPITAL_COMMUNITY)
Admission: EM | Admit: 2024-05-08 | Discharge: 2024-05-09 | Disposition: A | Attending: Internal Medicine | Admitting: Internal Medicine

## 2024-05-08 ENCOUNTER — Ambulatory Visit (HOSPITAL_COMMUNITY): Admission: RE | Admit: 2024-05-08 | Source: Ambulatory Visit

## 2024-05-08 ENCOUNTER — Ambulatory Visit: Admitting: Acute Care

## 2024-05-08 ENCOUNTER — Telehealth: Payer: Self-pay | Admitting: Pulmonary Disease

## 2024-05-08 DIAGNOSIS — Z7982 Long term (current) use of aspirin: Secondary | ICD-10-CM | POA: Diagnosis not present

## 2024-05-08 DIAGNOSIS — Z8679 Personal history of other diseases of the circulatory system: Secondary | ICD-10-CM | POA: Insufficient documentation

## 2024-05-08 DIAGNOSIS — R918 Other nonspecific abnormal finding of lung field: Principal | ICD-10-CM | POA: Diagnosis present

## 2024-05-08 DIAGNOSIS — R0789 Other chest pain: Secondary | ICD-10-CM | POA: Diagnosis present

## 2024-05-08 DIAGNOSIS — Z95828 Presence of other vascular implants and grafts: Secondary | ICD-10-CM | POA: Insufficient documentation

## 2024-05-08 DIAGNOSIS — R7989 Other specified abnormal findings of blood chemistry: Secondary | ICD-10-CM | POA: Diagnosis not present

## 2024-05-08 DIAGNOSIS — I10 Essential (primary) hypertension: Secondary | ICD-10-CM | POA: Diagnosis not present

## 2024-05-08 DIAGNOSIS — R932 Abnormal findings on diagnostic imaging of liver and biliary tract: Secondary | ICD-10-CM | POA: Insufficient documentation

## 2024-05-08 DIAGNOSIS — G8929 Other chronic pain: Secondary | ICD-10-CM | POA: Insufficient documentation

## 2024-05-08 LAB — BASIC METABOLIC PANEL WITH GFR
Anion gap: 10 (ref 5–15)
BUN: 12 mg/dL (ref 8–23)
CO2: 26 mmol/L (ref 22–32)
Calcium: 9.3 mg/dL (ref 8.9–10.3)
Chloride: 103 mmol/L (ref 98–111)
Creatinine, Ser: 1 mg/dL (ref 0.61–1.24)
GFR, Estimated: >60 mL/min
Glucose, Bld: 141 mg/dL — ABNORMAL HIGH (ref 70–99)
Potassium: 4.6 mmol/L (ref 3.5–5.1)
Sodium: 138 mmol/L (ref 135–145)

## 2024-05-08 LAB — CBC
HCT: 47.1 % (ref 39.0–52.0)
Hemoglobin: 14.9 g/dL (ref 13.0–17.0)
MCH: 28.3 pg (ref 26.0–34.0)
MCHC: 31.6 g/dL (ref 30.0–36.0)
MCV: 89.5 fL (ref 80.0–100.0)
Platelets: 306 K/uL (ref 150–400)
RBC: 5.26 MIL/uL (ref 4.22–5.81)
RDW: 13.4 % (ref 11.5–15.5)
WBC: 7.4 K/uL (ref 4.0–10.5)
nRBC: 0 % (ref 0.0–0.2)

## 2024-05-08 LAB — TROPONIN T, HIGH SENSITIVITY
Troponin T High Sensitivity: 76 ng/L — ABNORMAL HIGH (ref 0–19)
Troponin T High Sensitivity: 59 ng/L — ABNORMAL HIGH (ref 0–19)

## 2024-05-08 MED ORDER — ACETAMINOPHEN 325 MG PO TABS: 650.0000 mg | ORAL_TABLET | Freq: Four times a day (QID) | ORAL | Status: DC | PRN

## 2024-05-08 MED ORDER — ALBUTEROL SULFATE (2.5 MG/3ML) 0.083% IN NEBU: 2.5000 mg | INHALATION_SOLUTION | RESPIRATORY_TRACT | Status: DC | PRN

## 2024-05-08 MED ORDER — LORAZEPAM 1 MG PO TABS: 1.0000 mg | ORAL_TABLET | Freq: Once | ORAL | Status: DC

## 2024-05-08 MED ORDER — ACETAMINOPHEN 650 MG RE SUPP: 650.0000 mg | Freq: Four times a day (QID) | RECTAL | Status: DC | PRN

## 2024-05-08 MED ORDER — ONDANSETRON HCL 4 MG/2ML IJ SOLN: 4.0000 mg | Freq: Four times a day (QID) | INTRAMUSCULAR | Status: DC | PRN

## 2024-05-08 MED ORDER — LORAZEPAM 2 MG/ML IJ SOLN
1.0000 mg | Freq: Once | INTRAMUSCULAR | Status: AC
  Administered 2024-05-08: 1 mg via INTRAVENOUS
  Filled 2024-05-08: qty 1

## 2024-05-08 MED ORDER — ONDANSETRON HCL 4 MG PO TABS: 4.0000 mg | ORAL_TABLET | Freq: Four times a day (QID) | ORAL | Status: DC | PRN

## 2024-05-08 MED ORDER — OXYCODONE-ACETAMINOPHEN 5-325 MG PO TABS
2.0000 | ORAL_TABLET | Freq: Once | ORAL | Status: AC
  Administered 2024-05-08: 2 via ORAL
  Filled 2024-05-08: qty 2

## 2024-05-08 MED ORDER — OXYCODONE HCL 5 MG PO TABS
10.0000 mg | ORAL_TABLET | Freq: Four times a day (QID) | ORAL | Status: DC | PRN
  Administered 2024-05-08 – 2024-05-09 (×3): 10 mg via ORAL
  Filled 2024-05-08 (×3): qty 2

## 2024-05-08 MED ORDER — GABAPENTIN 300 MG PO CAPS
300.0000 mg | ORAL_CAPSULE | Freq: Three times a day (TID) | ORAL | Status: DC
  Administered 2024-05-08 – 2024-05-09 (×2): 300 mg via ORAL
  Filled 2024-05-08 (×2): qty 1

## 2024-05-08 MED ORDER — TRAZODONE HCL 50 MG PO TABS
25.0000 mg | ORAL_TABLET | Freq: Every evening | ORAL | Status: DC | PRN
  Administered 2024-05-08: 25 mg via ORAL
  Filled 2024-05-08: qty 1

## 2024-05-08 MED ORDER — GADOBUTROL 1 MMOL/ML IV SOLN
6.5000 mL | Freq: Once | INTRAVENOUS | Status: AC | PRN
  Administered 2024-05-08: 6.5 mL via INTRAVENOUS

## 2024-05-08 NOTE — H&P (Signed)
 " History and Physical  Victor [name] FMW:[id number] DOB: 09-05-1955 DOA: 05/08/2024  PCP: Patient, No Pcp Per   Chief Complaint: Right-sided chest pain  HPI: Victor [name] is a 69 y.o. male with medical history significant for aortic dissection status postrepair, recent diagnosis of right upper lobe lung mass being admitted to the hospital for uncontrolled pain.  He presented to the ER on 04/19/2024 with complaints of right sided chest pain with radiated to his back.  CT scan showed evidence of 3.5 cm mass concerning for primary bronchogenic carcinoma.  He was seen by oncology as an outpatient, they prescribed him pain medications, he had outpatient PET scan yesterday 1/12 and is scheduled for outpatient MRI abdomen as well as pulmonology workup including bronchoscopy with biopsy.  Apparently had some appointments which she missed due to transportation problems, when I talked to his sister over the phone she said that he had an appointment today and missed it because his transport brought him there to evaluate.  His sister also mentions that he has been taking more oxycodone  than is prescribed, and  he is not supposed to get any more oxycodone .  Per notes, patient states that his oxycodone  was misplaced at work and somebody took it.  In any case, he ended up in the ER due to his right sided pain, oncology is requesting admission to the hospital for pain control as well as expanded workup of his suspected bronchogenic carcinoma.  Review of Systems: Please see HPI for pertinent positives and negatives. A complete 10 system review of systems are otherwise negative.  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   Stroke [facility])    Thoracic aortic aneurysm 09/2014   Past Surgical History:  Procedure Laterality Date   ASCENDING AORTIC ROOT REPLACEMENT N/A 01/21/2017   Procedure: Replacement of Aortic Arch  with circulatory arrest;  Surgeon: Army Dallas NOVAK, MD;  Location: [facility] OR;  Service: Open Heart Surgery;  Laterality: N/A;   CARDIAC CATHETERIZATION     FEMORAL-FEMORAL BYPASS GRAFT N/A 10/07/2014   Procedure: LEFT  FEMORAL ARTERY -RIGHT FEMORAL ARTERY BYPASS GRAFT USING X 30 CM HEMASHIELD GOLD GRAFT;  Surgeon: Krystal JULIANNA Doing, MD;  Location: [facility] OR;  Service: Vascular;  Laterality: N/A;   RIGHT/LEFT HEART CATH AND CORONARY ANGIOGRAPHY N/A 01/11/2017   Procedure: RIGHT/LEFT HEART CATH AND CORONARY ANGIOGRAPHY- Diagnostic Only;  Surgeon: Wonda Sharper, MD;  Location: [facility] INVASIVE CV LAB;  Service: Cardiovascular;  Laterality: N/A;   s/p thoracic aneurysm repair     STERNOTOMY N/A 01/21/2017   Procedure: REDO STERNOTOMY;  Surgeon: Army Dallas NOVAK, MD;  Location: [facility] OR;  Service: Open Heart Surgery;  Laterality: N/A;   TEE WITHOUT CARDIOVERSION N/A 01/21/2017   Procedure: TRANSESOPHAGEAL ECHOCARDIOGRAM (TEE);  Surgeon: Army Dallas NOVAK, MD;  Location: [facility] OR;  Service: Open Heart Surgery;  Laterality: N/A;   THORACIC AORTIC ANEURYSM REPAIR N/A 10/06/2014   Procedure: THORACIC ASCENDING ANEURYSM REPAIR (AAA);  Surgeon: Dallas NOVAK Army, MD;  Location: [facility] OR;  Service: Open Heart Surgery;  Laterality: N/A;  hyportermia circulatory arrest and resuspension of aortic valve   THORACIC AORTIC ENDOVASCULAR STENT GRAFT N/A 01/21/2017   Procedure: THORACIC AORTIC ENDOVASCULAR STENT GRAFT;  Surgeon: Serene Gaile ORN, MD;  Location: [facility] OR;  Service: Vascular;  Laterality: N/A;   THROMBECTOMY ILIAC ARTERY Left 03/08/2017   Procedure: THROMBECTOMY  of Left subclavian artery;  Surgeon: Eliza Lonni RAMAN, MD;  Location: [facility] OR;  Service: Vascular;  Laterality: Left;   VASCULAR SURGERY     Social History:  reports that he quit smoking about 9 years ago. His smoking use included cigarettes and cigars. He started smoking about 44 years ago. He has never used smokeless tobacco. He reports current alcohol use. He  reports that he does not use drugs.  Allergies[1]  Family History  Problem Relation Age of Onset   Heart murmur Mother      Prior to Admission medications  Medication Sig Start Date End Date Taking? Authorizing Provider  oxyCODONE -acetaminophen  (PERCOCET) 7.5-325 MG tablet 1 tablet as needed Orally 3 times a day; Duration: 30 days 03/06/24  Yes [provider]  oxyCODONE -acetaminophen  (PERCOCET/ROXICET) 5-325 MG tablet Take 1 tablet by mouth 3 (three) times daily as needed. 02/15/24  Yes [provider]  aspirin  325 MG tablet Take 0.5 tablets (162.5 mg total) by mouth daily. 09/06/18   Victor Raisin, NP  gabapentin  (NEURONTIN ) 300 MG capsule Take 2 capsules (600 mg total) by mouth 3 (three) times daily. Patient not taking: Reported on 04/27/2024 10/17/23   Victor Birmingham, DO  Oxycodone  HCl 10 MG TABS Take 1 tablet (10 mg total) by mouth every 6 (six) hours as needed. 04/27/24   [name], Victor T, PA-C  tamsulosin (FLOMAX) 0.4 MG CAPS capsule 1 capsule.    [provider]    Physical Exam: BP 134/82   Pulse 85   Temp 98.4 F (36.9 C) (Oral)   Resp 16   SpO2 100%  General:  Alert, oriented, calm, in no acute distress, resting comfortably on room air.  No family present, but sisters participating via speaker phone. Cardiovascular: RRR, no murmurs or rubs, no peripheral edema  Respiratory: clear to auscultation bilaterally, no wheezes, no crackles  Abdomen: soft, nontender, nondistended, normal bowel tones heard  Skin: dry, no rashes  Musculoskeletal: no joint effusions, normal range of motion  Psychiatric: appropriate affect, normal speech  Neurologic: extraocular muscles intact, clear speech, moving all extremities with intact sensorium         Labs on Admission:  Basic Metabolic Panel: Recent Labs  Lab 05/07/24 0933 05/08/24 1025  NA 140 138  K 4.9 4.6  CL 105 103  CO2 27 26  GLUCOSE 108* 141*  BUN 10 12  CREATININE 0.94 1.00  CALCIUM  8.8* 9.3    Liver Function Tests: Recent Labs  Lab 05/07/24 0933  AST 15  ALT 10  ALKPHOS 87  BILITOT 0.5  PROT 7.1  ALBUMIN  3.7   Recent Labs  Lab 05/07/24 0933  LIPASE 18   No results for input(s): AMMONIA in the last 168 hours. CBC: Recent Labs  Lab 05/07/24 0933 05/08/24 1025  WBC 7.6 7.4  HGB 14.8 14.9  HCT 46.7 47.1  MCV 89.1 89.5  PLT 267 306   Cardiac Enzymes: No results for input(s): CKTOTAL, CKMB, CKMBINDEX, TROPONINI in the last 168 hours. BNP (last 3 results) No results for input(s): BNP in the last 8760 hours.  ProBNP (last 3 results) Recent Labs    04/19/24 0834  PROBNP 161.0    CBG: Recent Labs  Lab 05/07/24 0751  GLUCAP 105*    Radiological Exams on Admission: DG Chest Portable 1 View Result Date: 05/08/2024 CLINICAL DATA:  Chest pain. EXAM: PORTABLE CHEST 1 VIEW COMPARISON:  05/07/2024, chest CT 04/19/2024 and PET-CT 05/07/2024 FINDINGS: Lungs are adequately inflated. Mild stable bibasilar interstitial  prominence. 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 Stable lateral right midthoracic chest wall mass hypermetabolic on recent PET-CT. Cardiomediastinal silhouette and remainder of the exam including aortic stent graft unchanged. IMPRESSION: 1. No acute cardiopulmonary disease. 2. 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 Stable lateral right midthoracic chest wall mass hypermetabolic on recent PET-CT. Electronically Signed   By: Toribio Agreste M.D.   On: 05/08/2024 10:49   DG Chest 2 View Result Date: 05/07/2024 CLINICAL DATA:  Chest pain. EXAM: CHEST - 2 VIEW COMPARISON:  04/19/2024 FINDINGS: Stent graft involving the aortic arch and descending thoracic aorta is grossly stable. Again noted is a rounded opacity in the right upper lung compatible with patient's known mass measuring roughly 4.5 cm. Focal lateral density along the right mid chest  compatible with the known subpleural lesion. Probable nipple shadow in the right lower chest. No new airspace disease or pulmonary edema. Heart size is within normal limits. Prior median sternotomy. No large pleural effusions. IMPRESSION: 1. No acute cardiopulmonary disease. 2. Stable appearance of the right lung mass and right subpleural lesion. 3. Stable appearance of the thoracic aortic stent graft. Electronically Signed   By: Juliene Balder M.D.   On: 05/07/2024 11:06   NM PET Image Initial (PI) Skull Base To Thigh (F-18 FDG) Result Date: 05/07/2024 EXAM: PET AND CT SKULL BASE TO MID THIGH 05/07/2024 10:15:00 AM TECHNIQUE: RADIOPHARMACEUTICAL: 7.22 mCi F-18 FDG Uptake time 60 minutes. Glucose level 105 mg/dl. PET imaging was acquired from the base of the skull to the mid thighs. Non-contrast enhanced computed tomography was obtained for attenuation correction and anatomic localization. COMPARISON: CT 04/19/2024. 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 No metabolically active cervical lymphadenopathy. CHEST: Hypermetabolic mass in the right upper lobe measures 3.5 cm with SUV max equal 12.4. Hypermetabolic subpleural nodule on the right lateral chest wall measures 2.3 x 1.1 cm (image 74) with intense metabolic activity (SUV max equal 13.2). No hypermetabolic right hilar lymph nodes or mediastinal lymph nodes. There is intense metabolic activity associated with the proximal aspect of the thoracic aortic stent graft which is favored inflammatory activity. Midline sternotomy noted. No metabolically active lymphadenopathy. ABDOMEN AND PELVIS: Large left inguinal hernia with extensive small bowel and mesentery extending into the left hemiscrotum. No evidence of bowel obstruction. Incidental  finding of fem fem bypass graft. Urine contaminations in the groin region. Physiologic activity within the gastrointestinal and genitourinary systems. No metabolically active lymphadenopathy. BONES AND SOFT TISSUE: No metabolically active involvement of the adjacent chest wall or ribs beyond the described subpleural nodule. No metabolically active aggressive osseous lesion. IMPRESSION: 1. Hypermetabolic right upper lobe mass measuring 3.5 cm consistent with primary bronchogenic carcinoma 2. Hypermetabolic right lateral chest wall subpleural nodule consistent with metastatic disease. No clear chest wall involvement. 3. Intense metabolic activity associated with the proximal aspect of the thoracic aortic stent graft, favored to represent benign inflammatory activity. 4. Absent metabolic activity of the left vocal cord is favored to be nonmalignant as above. 5. Large left inguinal hernia containing small bowel and mesentery extending into the left hemiscrotum, without evidence of bowel obstruction. Electronically signed by: Norleen Boxer MD MD 05/07/2024 10:48 AM EST RP Workstation: [id number]   Assessment/Plan [name] is  a 69 y.o. male with medical history significant for aortic dissection status postrepair, recent diagnosis of right upper lobe lung mass being admitted to the hospital for uncontrolled pain.   Right sided lung mass-causing right-sided chest pain, patient states that his pain is stable and unchanged from previous.  Denies any shortness of breath, fevers, cough.  Pain radiates around the right side of his chest into his back, and is unchanged.  Chest pain is likely due to his right sided lung mass.  Mass is concerning for primary bronchogenic carcinoma. -Observation admission -Pain control with oxycodone  -Pulmonology consulted, discussed with Dr. Theophilus -Oncology aware of admission and will follow  Troponin elevation-mild, without acute EKG changes.  Will trend.  History of aortic  dissection-status post repair, on aspirin  daily -Will hold aspirin  for upcoming biopsy  Pancreatic head abnormality-seen on CT scan 12/25. -MRI abdomen with and without contrast  Chronic pain-continue home dose gabapentin   DVT prophylaxis: Lovenox      Code Status: Full Code  Consults called: Pulmonology, oncology  Admission status: Observation  Time spent: 42 minutes  Brityn Mastrogiovanni CHRISTELLA Gail MD [facility] Pager [phone]  If 7PM-7AM, please contact night-coverage [email or web address] Password [facility]  05/08/2024, 4:23 PM      [1] No Known Allergies  "

## 2024-05-08 NOTE — ED Notes (Signed)
 Unsuccessful IV attempt x2.

## 2024-05-08 NOTE — Consult Note (Signed)
" ° °  NAME:  Victor [name], MRN:  [id number], DOB:  03-29-1956, LOS: 0 ADMISSION DATE:  05/08/2024, CONSULTATION DATE: 05/08/2024 REFERRING MD: CHRISTELLA Gail MD  , CHIEF COMPLAINT:   Lung mass  History of Present Illness:   69 year old ex-smoker with medical history of kidney stones, hypertension, stroke, aortic dissection with repair, recent diagnosis of right upper lobe elevated lung mass coming in with right-sided chest pain.  Initially evaluated in the ED on 12/25 with CT showing 3.5 cm right upper lobe lung mass  Pertinent  Medical History    has a past medical history of Cancer [facility]), History of kidney stones, Hypertension, Spinal cord infarction [facility]) (02/2017), Stroke [facility]), and Thoracic aortic aneurysm (09/2014).   Significant Hospital Events: Including procedures, antibiotic start and stop dates in addition to other pertinent events     Interim History / Subjective:    Objective    Blood pressure 134/82, pulse 85, temperature 98.4 F (36.9 C), temperature source Oral, resp. rate 16, SpO2 100%.       No intake or output data in the 24 hours ending 05/08/24 1707 There were no vitals filed for this visit.  Examination: Awake, oriented, no distress Lungs are clear to auscultation Heart rate regular rate and rhythm Abdomen soft Extremities with no edema  CTA 04/19/2024 with significant vascular disease with stenting, 3.4 cm lung mass in the right upper lobe. PET scan 05/07/2024 hypermetabolic right upper lobe mass.  Hypermetabolic right lateral subpleural nodule with no chest wall involvement.SABRA   Resolved problem list   Assessment and Plan  Right lung mass Concerning for bronchogenic carcinoma Previous pulmonary appointment missed due to transportation issues We will arrange for navigational bronchoscopy with biopsy at next available time If unable to get it done while he is inpatient and then it will be scheduled as an outpatient Will need to ensure that he has  transportation to get to the procedure.  Signature:   Anden Bartolo MD [facility] See Amion for pager  If no response to pager , please call [phone] until 7pm After 7:00 pm call Elink  [phone] 05/08/2024, 5:08 PM          "

## 2024-05-08 NOTE — ED Notes (Addendum)
 Writer redrew lab wasting 10cc and then recollecting troponin
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
## 2024-05-08 NOTE — Telephone Encounter (Signed)
 Please schedule the following:  Provider performing procedure: Romney Compean / Byrum / Baral/ Alghanim / Hattar Diagnosis: RUL Mass Which side if for nodule / mass? Right Procedure: Nav bronch  Has patient been spoken to by Provider and given informed consent? Yes Anesthesia: yes, general Do you need Fluro? yes Duration of procedure: 1 hour Date: earliest possible date Alternate Date:   Time:  PM Location: [facility] Does patient have OSA? No DM? No Or Latex allergy? No Medication Restriction/ Anticoagulate/Antiplatelet: No Pre-op Labs Ordered:determined by Anesthesia Imaging request: n/a  (If, SuperDimension CT Chest, please have STAT courier sent to ENDO)  Thanks, Dorn Chill, MD [facility] Office: [phone]   See Amion for personal pager PCCM on call pager [phone] until 7pm. Please call Elink 7p-7a. [phone]

## 2024-05-08 NOTE — ED Triage Notes (Signed)
 Patient coming from cancer center c/o CP, I lost my pills, patient has not had his oxycodone . Patient is alert and oriented x 4. Airway patent, respirations even and unlabored. Skin normal, warm and dry. Denies fever/cough/chills.

## 2024-05-08 NOTE — ED Notes (Signed)
 Pt states he will come everyday to get pain pills if he doesn't get a prescription.

## 2024-05-09 ENCOUNTER — Other Ambulatory Visit (HOSPITAL_COMMUNITY): Payer: Self-pay

## 2024-05-09 ENCOUNTER — Encounter: Payer: Self-pay | Admitting: Pulmonary Disease

## 2024-05-09 ENCOUNTER — Observation Stay (HOSPITAL_COMMUNITY)

## 2024-05-09 ENCOUNTER — Encounter (HOSPITAL_COMMUNITY): Payer: Self-pay | Admitting: Internal Medicine

## 2024-05-09 DIAGNOSIS — R918 Other nonspecific abnormal finding of lung field: Secondary | ICD-10-CM | POA: Diagnosis not present

## 2024-05-09 LAB — CBC
HCT: 42.7 % (ref 39.0–52.0)
Hemoglobin: 13.5 g/dL (ref 13.0–17.0)
MCH: 28.3 pg (ref 26.0–34.0)
MCHC: 31.6 g/dL (ref 30.0–36.0)
MCV: 89.5 fL (ref 80.0–100.0)
Platelets: 278 K/uL (ref 150–400)
RBC: 4.77 MIL/uL (ref 4.22–5.81)
RDW: 13.5 % (ref 11.5–15.5)
WBC: 6.5 K/uL (ref 4.0–10.5)
nRBC: 0 % (ref 0.0–0.2)

## 2024-05-09 LAB — BASIC METABOLIC PANEL WITH GFR
Anion gap: 13 (ref 5–15)
BUN: 15 mg/dL (ref 8–23)
CO2: 22 mmol/L (ref 22–32)
Calcium: 9 mg/dL (ref 8.9–10.3)
Chloride: 101 mmol/L (ref 98–111)
Creatinine, Ser: 0.96 mg/dL (ref 0.61–1.24)
GFR, Estimated: >60 mL/min
Glucose, Bld: 126 mg/dL — ABNORMAL HIGH (ref 70–99)
Potassium: 4.3 mmol/L (ref 3.5–5.1)
Sodium: 136 mmol/L (ref 135–145)

## 2024-05-09 LAB — HIV ANTIBODY (ROUTINE TESTING W REFLEX): HIV Screen 4th Generation wRfx: NONREACTIVE

## 2024-05-09 MED ORDER — LIDOCAINE 5 % EX PTCH
2.0000 | MEDICATED_PATCH | CUTANEOUS | Status: DC
  Administered 2024-05-09: 1 via TRANSDERMAL
  Filled 2024-05-09: qty 2

## 2024-05-09 MED ORDER — LIDOCAINE 5 % EX PTCH
2.0000 | MEDICATED_PATCH | CUTANEOUS | 0 refills | Status: DC
  Filled 2024-05-09: qty 30, 15d supply, fill #0

## 2024-05-09 MED ORDER — OXYCODONE HCL 10 MG PO TABS
10.0000 mg | ORAL_TABLET | Freq: Four times a day (QID) | ORAL | 0 refills | Status: DC | PRN
  Filled 2024-05-09 (×2): qty 12, 3d supply, fill #0
  Filled ????-??-??: fill #0

## 2024-05-09 NOTE — Care Management Obs Status (Signed)
 MEDICARE OBSERVATION STATUS NOTIFICATION   Patient Details  Name: Victor [name] MRN: [id number] Date of Birth: [birth year]   Medicare Observation Status Notification Given:  Yes    MahabirNathanel, RN 05/09/2024, 3:07 PM

## 2024-05-09 NOTE — Discharge Summary (Signed)
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 relationship with a primary care physician if you do not have one) for your post hospital discharge needs so that they can reassess your need for medications and monitor your lab values.  Time coordinating discharge: 35 minutes  SIGNED:  Nilda Fendt, MD, PhD 05/09/2024, 3:23 PM   "

## 2024-05-09 NOTE — Telephone Encounter (Signed)
 I will call the patient and give appt info Case# [id number] will send to [facility] to check auth

## 2024-05-09 NOTE — Progress Notes (Signed)
 Discharge Medications delivered from TOC meds to bed [facility] outpatient pharmacy by this RN.   Delivery visualized by Magan NT and Holli, RN

## 2024-05-09 NOTE — Progress Notes (Signed)
" ° °  NAME:  Victor [name], MRN:  [id number], DOB:  [birth year], LOS: 0 ADMISSION DATE:  05/08/2024, CONSULTATION DATE: 05/08/2024 REFERRING MD: CHRISTELLA Gail MD  , CHIEF COMPLAINT:   Lung mass  History of Present Illness:   69 year old ex-smoker with medical history of kidney stones, hypertension, stroke, aortic dissection with repair, recent diagnosis of right upper lobe elevated lung mass coming in with right-sided chest pain.  Initially evaluated in the ED on 12/25 with CT showing 3.5 cm right upper lobe lung mass  Pertinent  Medical History    has a past medical history of Cancer [facility]), History of kidney stones, Hypertension, Spinal cord infarction [facility]) (02/2017), Stroke [facility]), and Thoracic aortic aneurysm (09/2014).   Significant Hospital Events: Including procedures, antibiotic start and stop dates in addition to other pertinent events   1/13 admit, PCCM  Interim History / Subjective:   Has chronic right chest pain.  No new complaints.  Respiratory status stable on room  Objective    Blood pressure 133/86, pulse 85, temperature 98.3 F (36.8 C), resp. rate 18, height 5' 7 (1.702 m), weight 65.7 kg, SpO2 98%.       No intake or output data in the 24 hours ending 05/09/24 0936 Filed Weights   05/08/24 2011  Weight: 65.7 kg    Examination: Awake, oriented.  No distress Lungs are clear to auscultation Heart rate regular rate and rhythm Abdomen soft Extremities with no edema  CTA 04/19/2024 with significant vascular disease with stenting, 3.4 cm lung mass in the right upper lobe. PET scan 05/07/2024 hypermetabolic right upper lobe mass.  Hypermetabolic right lateral subpleural nodule with no chest wall involvement.SABRA   Resolved problem list   Assessment and Plan  Right lung mass Concerning for bronchogenic carcinoma Previous pulmonary appointment missed due to transportation issues.  Per notes he has also been taking too much narcotics and street drugs at home and sleeps  over and misses his appointments. Order super D CT.   Navigational bronchoscopy scheduled for 1/29 as that is the earliest we can get him on the list. Emphasized to the patient that he will need to keep his appointment for bronchoscopy to get the diagnosis and treatment I also called his sister Victor [name] and discussed the plan with her.  Family will make sure that transportation is arranged for his appointment on 1/29  Pain management per primary team PCCM will be available as needed.  Please call with questions.  Signature:   Ulah Olmo MD [facility] See Amion for pager  If no response to pager , please call [phone] until 7pm After 7:00 pm call Elink  [phone] 05/09/2024, 9:36 AM          "

## 2024-05-09 NOTE — Progress Notes (Incomplete)
 " PROGRESS NOTE    Victor [name]  FMW:[id number] DOB: Aug 20, 1955 DOA: 05/08/2024 PCP: Patient, No Pcp Per (Confirm with patient/family/NH records and if not entered, this HAS to be entered at [facility] point of entry. No PCP if truly none.)   Chief Complaint  Patient presents with   Chest Pain    Brief Narrative: (Start on day 1 of progress note - keep it brief and live) Pt  is a 69 y/o male, with a hx of aortic dissection, status post repair, bilateral leg weakness post spinal cord infarction, stroke, HTN, and was recently diagnosed (12/25) with right lung mass concerning for cancer upon recent visit to ED c/o right sided chest pain. Pt missed appt for nav bronchoscopy and biopsy one week prior, and due to getting meds stolen at barber shop ran out of oxycodone  and went back to ED for pain control.  States he has had the right sided chest pain radiating to the back, and on and off shortness of breath. Denies fever, cough, or lower extremity swelling.   Of note, phone records from sister who helps take care of him did report concern he may be taking his pain medication more often than prescribed.   CT 12/25- noted stable stenting from previous thoracic aorta dissection, enlarged pancreatic head and body(MRI recommended as outpatient follow up, received MRI 1/13 no longer a concern), 3.3 x 3.  3.4 cm low attenuation lung mass in upper right lobe PET 1/12-unchanged lung lobe mass, nodule on pleural wall- suggestive of metastatic lung  disease.  Incidental finding of large left inguinal hernia containing small bowel and mesentery extending into the left hemiscrotum, without evidence of bowel obstruction, to follow up with outpatient. Xray 1/13- unchanged lung lobe mass, and stable right pleural lesion  Bronchoscopy rescheduled for 1/29.   Assessment & Plan:   Mass of upper lobe of right lung  Right sided chest wall pain, radiating to back CT 12/25- noted stable stenting from previous  thoracic aorta dissection, enlarged pancreatic  head and body(MRI recommended as outpatient follow up, received MRI 1/13 no longer a concern), 3.3 x 3.  3.4 cm low attenuation lung mass in upper right lobe PET 1/12-unchanged lung lobe mass, nodule on pleural wall- suggestive of metastatic lung  disease.  Xray 1/13- unchanged lung lobe mass, and stable right pleural lesion  Pt awaiting CT Super D Chest w/o contrast MRI 1/13- found 2.6 cm x 1.7 cm right lateral pleural based mass and 1.1 x 0.7 cm triangular enhancing focus within retroperitoneal region, demonstrating fluid restriction, suggesting metastatis  Awaiting CT Super D Chest today, and bronchoscopy and biopsy scheduled for 1/29. Continue gabapentin , and 10 mg oxycodone  for pain, zofran  if nauseous    Inguinal Hernia PET 1/12-Incidental finding of large left inguinal hernia containing small bowel and mesentery extending into the left hemiscrotum, without evidence of bowel obstruction,  Follow up outpatient.  DVT prophylaxis: None Code Status: Full Family Communication: No Family at bedside Disposition:   Status is: Observation {Observation:23811}   Consultants:  Pulmonology   Subjective: Pt is found AxOx4, somnolent but easily arousable, well mannered in no acute distress. Pt states that they are having severe pain in right chest, and back that worsens when you palpate it. Notes the quality as aching, radiating to back, with oxycodone  and morphine  being the only thing that makes it better. States the pain is constant and rates it 10/10. Has been dealing with the pain for over a month, and states  he has had recurrent shortness of breath for 2-3 months. Today denies headache, n/v/d, dizziness, constipation, substernal chest pain, fever, chills, cough. Bilateral chronic calf pain as per patient.   Objective: Vitals:   05/08/24 2011 05/08/24 2211 05/09/24 0200 05/09/24 0556  BP:  (!) 82/59 104/70 133/86  Pulse:  89 87 85  Resp:   18 18 18   Temp:  97.9 F (36.6 C) 98 F (36.7 C) 98.3 F (36.8 C)  TempSrc:   Oral   SpO2:  98% 97% 98%  Weight: 65.7 kg     Height: 5' 7 (1.702 m)      No intake or output data in the 24 hours ending 05/09/24 1045 Filed Weights   05/08/24 2011  Weight: 65.7 kg    Examination:  General exam: Appears calm and comfortable, NAD Respiratory system: Clear to auscultation. Respiratory effort normal.Tender on palpation of right upper lobe, pulls away and c/o pain. Cardiovascular system: S1 & S2 heard, RRR. No JVD, murmurs, rubs, gallops or clicks. No pedal edema. Gastrointestinal system: Abdomen is nondistended, soft and nontender.  Central nervous system: Alert and oriented. No focal neurological deficits. Extremities: Symmetric 5 x 5 power. Skin: No rashes, lesions or ulcers     Data Reviewed: I have personally reviewed following labs and imaging studies  CBC: Recent Labs  Lab 05/07/24 0933 05/08/24 1025 05/09/24 0656  WBC 7.6 7.4 6.5  HGB 14.8 14.9 13.5  HCT 46.7 47.1 42.7  MCV 89.1 89.5 89.5  PLT 267 306 278    Basic Metabolic Panel: Recent Labs  Lab 05/07/24 0933 05/08/24 1025 05/09/24 0656  NA 140 138 136  K 4.9 4.6 4.3  CL 105 103 101  CO2 27 26 22   GLUCOSE 108* 141* 126*  BUN 10 12 15   CREATININE 0.94 1.00 0.96  CALCIUM  8.8* 9.3 9.0    GFR: Estimated Creatinine Clearance: 69.4 mL/min (by C-G formula based on SCr of 0.96 mg/dL).  Liver Function Tests: Recent Labs  Lab 05/07/24 0933  AST 15  ALT 10  ALKPHOS 87  BILITOT 0.5  PROT 7.1  ALBUMIN  3.7    CBG: Recent Labs  Lab 05/07/24 0751  GLUCAP 105*     No results found for this or any previous visit (from the past 240 hours).       Radiology Studies: MR ABDOMEN W WO CONTRAST Result Date: 05/08/2024 CLINICAL DATA:  Hypermetabolic right upper lobe lung mass EXAM: MRI ABDOMEN WITHOUT AND WITH CONTRAST TECHNIQUE: Multiplanar multisequence MR imaging of the abdomen was performed both  before and after the administration of intravenous contrast. Heavily T2-weighted images of the biliary and pancreatic ducts were obtained. Post-processing was applied at the acquisition scanner with concurrent physician supervision which includes 3D reconstructions, MIPs, volume rendered images and/or shaded surface rendering. CONTRAST:  6.5mL GADAVIST  GADOBUTROL  1 MMOL/ML IV SOLN COMPARISON:  Nuclear medicine PET dated 05/07/2024, CTA abdomen and pelvis dated 04/19/2024 FINDINGS: Lower chest: Partially imaged enhancing right lateral pleural-based mass measures 2.6 x 1.7 cm (21:30). Hepatobiliary: No mass or other parenchymal abnormality identified. No bile duct dilation. Contracted gallbladder containing cholelithiasis. Pancreas: No mass, inflammatory changes, or other parenchymal abnormality identified. Spleen:  Within normal limits in size and appearance. Adrenals/Urinary Tract: No adrenal nodules. No suspicious renal masses identified. No evidence of hydronephrosis. Hemorrhagic/proteinaceous cyst in the interpolar right kidney measures 2.1 x 1.9 cm (11:46). No specific follow-up imaging recommended. Additional nonenhancing left renal cysts. Stomach/Bowel: Visualized portions within the abdomen are unremarkable. Vascular/Lymphatic: Descending  thoracic aortic dissection and aneurysm in the upper abdomen, better evaluated on prior CTA. Other: 1.1 x 0.7 cm triangular enhancing focus within the right retroperitoneal region (15:46) demonstrates diffusion restriction (6:22, 7:22) and is not well appreciated on prior examinations. Musculoskeletal: No suspicious bone lesions identified. Median sternotomy wires are nondisplaced. IMPRESSION: 1. No focal hepatic or pancreatic lesions. 2. Partially imaged enhancing right lateral pleural-based mass measures 2.6 x 1.7 cm. 3. A 1.1 x 0.7 cm triangular enhancing focus within the right retroperitoneal region demonstrates diffusion restriction and is not well appreciated on prior  examinations. This finding is indeterminate but suspicious for metastatic disease. Recommend attention on follow-up. 4. 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 Electronically Signed   By: Limin  Xu M.D.   On: 05/08/2024 19:12   MR 3D Recon At Scanner Result Date: 05/08/2024 CLINICAL DATA:  Hypermetabolic right upper lobe lung mass EXAM: MRI ABDOMEN WITHOUT AND WITH CONTRAST TECHNIQUE: Multiplanar multisequence MR imaging of the abdomen was performed both before and after the administration of intravenous contrast. Heavily T2-weighted images of the biliary and pancreatic ducts were obtained. Post-processing was applied at the acquisition scanner with concurrent physician supervision which includes 3D reconstructions, MIPs, volume rendered images and/or shaded surface rendering. CONTRAST:  6.5mL GADAVIST  GADOBUTROL  1 MMOL/ML IV SOLN COMPARISON:  Nuclear medicine PET dated 05/07/2024, CTA abdomen and pelvis dated 04/19/2024 FINDINGS: Lower chest: Partially imaged enhancing right lateral pleural-based mass measures 2.6 x 1.7 cm (21:30). Hepatobiliary: No mass or other parenchymal abnormality identified. No bile duct dilation. Contracted gallbladder containing cholelithiasis. Pancreas: No mass, inflammatory changes, or other parenchymal abnormality identified. Spleen:  Within normal limits in size and appearance. Adrenals/Urinary Tract: No adrenal nodules. No suspicious renal masses identified. No evidence of hydronephrosis. Hemorrhagic/proteinaceous cyst in the interpolar right kidney measures 2.1 x 1.9 cm (11:46). No specific follow-up imaging recommended. Additional nonenhancing left renal cysts. Stomach/Bowel: Visualized portions within the abdomen are unremarkable. 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 Other: 1.1 x 0.7 cm triangular enhancing focus within the right  retroperitoneal region (15:46) demonstrates diffusion restriction (6:22, 7:22) and is not well appreciated on prior examinations. Musculoskeletal: No suspicious bone lesions identified. Median sternotomy wires are nondisplaced. IMPRESSION: 1. No focal hepatic or pancreatic lesions. 2. Partially imaged enhancing right lateral pleural-based mass measures 2.6 x 1.7 cm. 3. A 1.1 x 0.7 cm triangular enhancing focus within the right retroperitoneal region demonstrates diffusion restriction and is not well appreciated on prior examinations. This finding is indeterminate but suspicious for metastatic disease. Recommend attention on follow-up. 4. 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 Electronically Signed   By: Limin  Xu M.D.   On: 05/08/2024 19:12   DG Chest Portable 1 View Result Date: 05/08/2024 CLINICAL DATA:  Chest pain. EXAM: PORTABLE CHEST 1 VIEW COMPARISON:  05/07/2024, chest CT 04/19/2024 and PET-CT 05/07/2024 FINDINGS: Lungs are adequately inflated. Mild stable bibasilar interstitial prominence. 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 Stable lateral right midthoracic chest wall mass hypermetabolic on recent PET-CT. Cardiomediastinal silhouette and remainder of the exam including aortic stent graft unchanged. IMPRESSION: 1. No acute cardiopulmonary disease. 2. 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 Stable lateral right midthoracic chest wall mass hypermetabolic on recent PET-CT. Electronically Signed   By: Toribio Agreste M.D.  On: 05/08/2024 10:49        Scheduled Meds:  gabapentin   300 mg Oral TID   Continuous Infusions:   LOS: 0 days    Time spent: 15 minutes    Victor Romano Delene Kass, PA-S [facility]   To contact the attending provider between 7A-7P or the covering provider during after hours 7P-7A, please log  into the web site [email or web address] and access using universal [place] password for that web site. If you do not have the password, please call the hospital operator.  05/09/2024, 10:45 AM   "

## 2024-05-09 NOTE — Plan of Care (Signed)
  Problem: Activity: Goal: Risk for activity intolerance will decrease Outcome: Progressing   Problem: Nutrition: Goal: Adequate nutrition will be maintained Outcome: Progressing   Problem: Coping: Goal: Level of anxiety will decrease Outcome: Progressing   Problem: Pain Managment: Goal: General experience of comfort will improve and/or be controlled Outcome: Progressing   Problem: Safety: Goal: Ability to remain free from injury will improve Outcome: Progressing

## 2024-05-09 NOTE — Progress Notes (Signed)
 Called patient's family for transport home. Sister stated she is contacting a family member to come and get the patient.

## 2024-05-09 NOTE — Telephone Encounter (Signed)
 Dr Theophilus is at the hospital and is going to give the patient the letter about the Bronch

## 2024-05-09 NOTE — TOC Transition Note (Signed)
 Transition of Care [facility]) - Discharge Note   Patient Details  Name: KIP CROPP MRN: [id number] Date of Birth: 09/28/1955  [facility] CM/SW Contact:  Bascom Service, RN Phone Number: 05/09/2024, 3:30 PM   Clinical Narrative:   d/c home no CM needs.    Final next level of care: Home/Self Care Barriers to Discharge: No Barriers Identified   Patient Goals and CMS Choice Patient states their goals for this hospitalization and ongoing recovery are:: Home CMS Medicare.gov Compare Post Acute Care list provided to:: Patient Choice offered to / list presented to : Patient [place] ownership interest in [facility].provided to:: Patient    Discharge Placement                       Discharge Plan and Services Additional resources added to the After Visit Summary for     Discharge Planning Services: CM Consult                                 Social Drivers of Health (SDOH) Interventions SDOH Screenings   Food Insecurity: No Food Insecurity (05/08/2024)  Housing: Low Risk (05/08/2024)  Transportation Needs: Unmet Transportation Needs (05/08/2024)  Utilities: Not At Risk (05/08/2024)  Depression (PHQ2-9): High Risk (04/27/2024)  Social Connections: Unknown (05/08/2024)  Tobacco Use: Medium Risk (05/08/2024)     Readmission Risk Interventions     No data to display

## 2024-05-09 NOTE — TOC Initial Note (Signed)
 Transition of Care [facility]) - Initial/Assessment Note    Patient Details  Name: Victor [name] MRN: [id number] Date of Birth: 1955/09/04  [facility] CM/SW Contact:    Bascom Service, RN Phone Number: 05/09/2024, 3:28 PM  Clinical Narrative: d/c plan home. No PCP-provided w/PCP listing. Has own transport home.                  Expected Discharge Plan: Home/Self Care Barriers to Discharge: Continued Medical Work up   Patient Goals and CMS Choice Patient states their goals for this hospitalization and ongoing recovery are:: Home CMS Medicare.gov Compare Post Acute Care list provided to:: Patient Choice offered to / list presented to : Patient [place] ownership interest in [facility].provided to:: Patient    Expected Discharge Plan and Services   Discharge Planning Services: CM Consult   Living arrangements for the past 2 months: Single Family Home Expected Discharge Date: 05/09/24                                    Prior Living Arrangements/Services Living arrangements for the past 2 months: Single Family Home Lives with:: Relatives   Do you feel safe going back to the place where you live?: Yes          Current home services: DME (rw in rm)    Activities of Daily Living   ADL Screening (condition at time of admission) Independently performs ADLs?: No Does the patient have a NEW difficulty with bathing/dressing/toileting/self-feeding that is expected to last >3 days?: No Does the patient have a NEW difficulty with getting in/out of bed, walking, or climbing stairs that is expected to last >3 days?: No Does the patient have a NEW difficulty with communication that is expected to last >3 days?: No Is the patient deaf or have difficulty hearing?: No Does the patient have difficulty seeing, even when wearing glasses/contacts?: Yes Does the patient have difficulty concentrating, remembering, or making decisions?: No  Permission  Sought/Granted Permission sought to share information with : Case Manager Permission granted to share information with : Yes, Verbal Permission Granted              Emotional Assessment              Admission diagnosis:  Mass of lung [R91.8] Mass of upper lobe of right lung [R91.8] Patient Active Problem List   Diagnosis Date Noted   Mass of upper lobe of right lung 05/08/2024   Chronic pain syndrome 07/02/2021   History of aortic dissection 07/02/2021   History of stroke 07/02/2021   Unilateral inguinal hernia without obstruction or gangrene 06/29/2017   Spinal cord infarction (HCC) 03/15/2017   Paraplegia (HCC) 03/07/2017   Bilateral leg weakness 03/07/2017   S/P thoracic aortic aneurysm repair 01/21/2017   Coronary artery calcification 12/23/2016   S/P aortic dissection repair 12/23/2016   Thoracic aortic aneurysm without rupture 12/23/2016   Essential hypertension 09/23/2015   Iron deficiency anemia 01/28/2015   Hospital-acquired pneumonia 10/09/2014   Aortic dissection (HCC) 10/07/2014   PCP:  Patient, No Pcp Per Pharmacy:   [id number] [place], [place] - [address] AT [facility] [address] [place] [address] Phone: [phone] Fax: [phone]  [id number] - [place], [place] - [address] AT [facility] [address] [place]  [place] [address] Phone: [phone] Fax: [phone]  [place] [facility] [address] [place] [address] Phone: [phone] Fax: [phone]     Social Drivers of Health (SDOH) Social History: SDOH Screenings   Food Insecurity: No Food Insecurity (05/08/2024)  Housing: Low Risk (05/08/2024)  Transportation Needs: Unmet Transportation Needs (05/08/2024)  Utilities: Not At Risk (05/08/2024)  Depression (PHQ2-9): High Risk (04/27/2024)  Social Connections: Unknown (05/08/2024)  Tobacco Use: Medium Risk (05/08/2024)    SDOH Interventions:     Readmission Risk Interventions     No data to display

## 2024-05-10 NOTE — Progress Notes (Unsigned)
 "  Office Visit    Patient Name: Victor [name] Date of Encounter: 05/11/2024  Primary Care Provider:  Patient, No Pcp Per Primary Cardiologist:  [name], MD  Chief Complaint   69 year old male with a history of coronary artery calcification, thoracic aortic dissection s/p replacement of ascending aorta, suspension of the aortic valve in 2016, s/p aortic arch debranching, replacement of the aortic arch in 2018, PAD s/p fem-fem bypass in 2016, spinal stroke, R lung mass, chronic pain syndrome, polysubstance abuse, and former tobacco use who presents for [facility] new patient evaluation.   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   Stroke [facility])    Thoracic aortic aneurysm 09/2014   Past Surgical History:  Procedure Laterality Date   ASCENDING AORTIC ROOT REPLACEMENT N/A 01/21/2017   Procedure: Replacement of Aortic Arch with circulatory arrest;  Surgeon: Army Dallas NOVAK, MD;  Location: [facility] OR;  Service: Open Heart Surgery;  Laterality: N/A;   CARDIAC CATHETERIZATION     FEMORAL-FEMORAL BYPASS GRAFT N/A 10/07/2014   Procedure: LEFT  FEMORAL ARTERY -RIGHT FEMORAL ARTERY BYPASS GRAFT USING X 30 CM HEMASHIELD GOLD GRAFT;  Surgeon: Krystal JULIANNA Doing, MD;  Location: [facility] OR;  Service: Vascular;  Laterality: N/A;   RIGHT/LEFT HEART CATH AND CORONARY ANGIOGRAPHY N/A 01/11/2017   Procedure: RIGHT/LEFT HEART CATH AND CORONARY ANGIOGRAPHY- Diagnostic Only;  Surgeon: Wonda Sharper, MD;  Location: [facility] INVASIVE CV LAB;  Service: Cardiovascular;  Laterality: N/A;   s/p thoracic aneurysm repair     STERNOTOMY N/A 01/21/2017   Procedure: REDO STERNOTOMY;  Surgeon: Army Dallas NOVAK, MD;  Location: [facility] OR;  Service: Open Heart Surgery;  Laterality: N/A;   TEE WITHOUT CARDIOVERSION N/A 01/21/2017   Procedure: TRANSESOPHAGEAL ECHOCARDIOGRAM (TEE);  Surgeon: Army Dallas NOVAK, MD;  Location: [facility] OR;  Service: Open Heart Surgery;  Laterality: N/A;   THORACIC AORTIC ANEURYSM REPAIR N/A 10/06/2014   Procedure: THORACIC ASCENDING ANEURYSM REPAIR (AAA);  Surgeon: Dallas NOVAK Army, MD;  Location[facility] OR;  Service: Open Heart Surgery;  Laterality: N/A;  hyportermia circulatory arrest and resuspension of aortic valve   THORACIC AORTIC ENDOVASCULAR STENT GRAFT N/A 01/21/2017   Procedure: THORACIC AORTIC ENDOVASCULAR STENT GRAFT;  Surgeon: Serene Gaile ORN, MD;  Location: [facility] OR;  Service: Vascular;  Laterality: N/A;   THROMBECTOMY ILIAC ARTERY Left 03/08/2017   Procedure: THROMBECTOMY of Left subclavian artery;  Surgeon: Eliza Lonni RAMAN, MD;  Location: [facility] OR;  Service: Vascular;  Laterality: Left;   VASCULAR SURGERY      Allergies  Allergies[1]   Labs/Other Studies Reviewed    The following studies were reviewed today:  Cardiac Studies & Procedures   ______________________________________________________________________________________________ CARDIAC CATHETERIZATION  CARDIAC CATHETERIZATION 01/11/2017  Conclusion 1. Widely patent, angiographically normal, dominant RCA 2. Angiographically normal LAD and LCx 3. Mild narrowing at the ostium of the left main without pressure dampening 4. Normal right heart hemodynamics  Findings Coronary Findings Diagnostic  Dominance: Right  Left Main there is minmal narrowing of the left main ostium, approximately 20%, probably not atherosclerotic. There is no pressure dampening and normal contrast reflux during injection.  Left Anterior Descending Vessel is angiographically normal.  Ramus Intermedius Vessel is angiographically normal.  Left Circumflex Vessel is angiographically normal.  First Obtuse Marginal Branch Vessel is angiographically normal.  Second Obtuse Marginal  Branch Vessel is angiographically normal.  Right Coronary Artery Vessel is angiographically normal.  Intervention  No  interventions have been documented.   STRESS TESTS  MYOCARDIAL PERFUSION IMAGING 12/15/2016  Interpretation Summary  The left ventricular ejection fraction is normal (55-65%).  Nuclear stress EF: 59%.  There was no ST segment deviation noted during stress.  The study is normal.  This is a low risk study.  Normal resting and stress perfusion. No ischemia or infarction EF 59%   ECHOCARDIOGRAM  ECHOCARDIOGRAM COMPLETE 12/23/2016  Narrative *[facility]* [address] [place], [place] [address] [phone]  ------------------------------------------------------------------- Transthoracic Echocardiography  Patient:    Victor, [name] MR #:       [id number] Study Date: 12/23/2016 Gender:     M Age:        61 Height:     170.2 cm Weight:     75.3 kg BSA:        1.9 m^2 Pt. Status: Room:  ATTENDING    Aleene Passe, M.D. ORDERING     Dallas Jude MD REFERRING    Dallas Jude MD REFERRING    [place] [id number] SONOGRAPHER  Carl Plunk, RDCS PERFORMING   [facility], Outpatient  cc:  ------------------------------------------------------------------- LV EF: 60% -   65%  ------------------------------------------------------------------- Indications:      I35.1 Aortic Valve Insufficiency.  ------------------------------------------------------------------- History:   PMH:  Acquired from the patient and from the patient&'s chart.  PMH:  Thoracic Aortic Aneurysm.  ------------------------------------------------------------------- Study Conclusions  - Left ventricle: The cavity size was normal. Wall thickness was increased in a pattern of mild LVH. Systolic function was normal. The estimated ejection fraction was in the range of 60% to 65%. Wall motion was normal; there were no regional wall motion abnormalities. Features are consistent with a pseudonormal left ventricular filling pattern, with concomitant abnormal relaxation and increased  filling pressure (grade 2 diastolic dysfunction). - Aortic valve: There was trivial regurgitation.  ------------------------------------------------------------------- Study data:   Study status:  Routine.  Procedure:  The patient reported no pain pre or post test. Transthoracic echocardiography for left ventricular function evaluation, for right ventricular function evaluation, and for assessment of valvular function. Image quality was adequate.  Study completion:  There were no complications.          Transthoracic echocardiography.  M-mode, complete 2D, spectral Doppler, and color Doppler.  Birthdate: Patient birthdate: July 16, 1955.  Age:  Patient is 69 yr old.  Sex: Gender: male.    BMI: 26 kg/m^2.  Blood pressure:     140/93 Patient status:  Outpatient.  Study date:  Study date: 12/23/2016. Study time: 09:35 AM.  Location:  [facility]  -------------------------------------------------------------------  ------------------------------------------------------------------- Left ventricle:  The cavity size was normal. Wall thickness was increased in a pattern of mild LVH. Systolic function was normal. The estimated ejection fraction was in the range of 60% to 65%. Wall motion was normal; there were no regional wall motion abnormalities. Features are consistent with a pseudonormal left ventricular filling pattern, with concomitant abnormal relaxation and increased filling pressure (grade 2 diastolic dysfunction).  ------------------------------------------------------------------- Aortic valve:   Structurally normal valve.   Cusp separation was normal.  Doppler:  Transvalvular velocity was within the normal range. There was no stenosis. There was trivial regurgitation.  ------------------------------------------------------------------- Aorta:  Aortic root: The aortic root was normal in size. Ascending aorta: The ascending aorta was normal in  size.  ------------------------------------------------------------------- Mitral valve:   Structurally normal valve.   Leaflet separation was  normal.  Doppler:  Transvalvular velocity was within the normal range. There was no evidence for stenosis. There was no regurgitation.  ------------------------------------------------------------------- Left atrium:  The atrium was normal in size.  ------------------------------------------------------------------- Right ventricle:  The cavity size was normal. Systolic function was normal.  ------------------------------------------------------------------- Pulmonic valve:    The valve appears to be grossly normal. Doppler:  There was no significant regurgitation.  ------------------------------------------------------------------- Tricuspid valve:   The valve appears to be grossly normal. Doppler:  There was mild regurgitation.  ------------------------------------------------------------------- Pulmonary artery:   Systolic pressure was within the normal range.  ------------------------------------------------------------------- Right atrium:  The atrium was normal in size.  ------------------------------------------------------------------- Pericardium:  There was no pericardial effusion.  ------------------------------------------------------------------- Systemic veins: Inferior vena cava: The vessel was normal in size. The respirophasic diameter changes were in the normal range (>= 50%), consistent with normal central venous pressure.  ------------------------------------------------------------------- Measurements  Left ventricle                         Value        Reference LV ID, ED, PLAX chordal        (L)     34.3  mm     43 - 52 LV ID, ES, PLAX chordal                26.1  mm     23 - 38 LV fx shortening, PLAX chordal (L)     24    %      >=29 LV PW thickness, ED                    11.6  mm     --------- IVS/LV PW  ratio, ED                    1.12         <=1.3 Stroke volume, 2D                      55    ml     --------- Stroke volume/bsa, 2D                  29    ml/m^2 --------- LV e&', lateral                         10.4  cm/s   --------- LV E/e&', lateral                       6.79         --------- LV e&', medial                          9.87  cm/s   --------- LV E/e&', medial                        7.15         --------- LV e&', average                         10.14 cm/s   --------- LV E/e&', average                       6.97         ---------  Ventricular septum  Value        Reference IVS thickness, ED                      13    mm     ---------  LVOT                                   Value        Reference LVOT ID, S                             21    mm     --------- LVOT area                              3.46  cm^2   --------- LVOT ID                                21    mm     --------- LVOT peak velocity, S                  82    cm/s   --------- LVOT mean velocity, S                  60.7  cm/s   --------- LVOT VTI, S                            15.8  cm     --------- LVOT peak gradient, S                  3     mm Hg  --------- Stroke volume (SV), LVOT DP            54.7  ml     --------- Stroke index (SV/bsa), LVOT DP         28.8  ml/m^2 ---------  Aorta                                  Value        Reference Aortic root ID, ED                     40    mm     --------- Ascending aorta ID, A-P, S             32    mm     ---------  Left atrium                            Value        Reference LA ID, A-P, ES                         41    mm     --------- LA ID/bsa, A-P                         2.16  cm/m^2 <=2.2 LA volume, S  40    ml     --------- LA volume/bsa, S                       21.1  ml/m^2 --------- LA volume, ES, 1-p A4C                 47    ml     --------- LA volume/bsa, ES, 1-p A4C             24.7  ml/m^2  --------- LA volume, ES, 1-p A2C                 30    ml     --------- LA volume/bsa, ES, 1-p A2C             15.8  ml/m^2 ---------  Mitral valve                           Value        Reference Mitral E-wave peak velocity            70.6  cm/s   --------- Mitral A-wave peak velocity            73.1  cm/s   --------- Mitral deceleration time               197   ms     150 - 230 Mitral E/A ratio, peak                 1            ---------  Tricuspid valve                        Value        Reference Tricuspid regurg peak velocity         248   cm/s   --------- Tricuspid peak RV-RA gradient          25    mm Hg  ---------  Right ventricle                        Value        Reference RV s&', lateral, S                      15.7  cm/s   ---------  Legend: (L)  and  (H)  mark values outside specified reference range.  ------------------------------------------------------------------- Prepared and Electronically Authenticated by  Aleene Passe, M.D. 2018-08-30T13:38:49   TEE  ECHO TEE 01/21/2017  Interpretation Summary  Left ventricle: Concentric hypertrophy of moderate severity. LV systolic function is mildly reduced with an EF of 45-50%. There are no obvious wall motion abnormalities.  Septum: No Patent Foramen Ovale present.  Left atrium: Patent foramen ovale not present.  Aortic valve: The valve is trileaflet. Trace regurgitation.  Aorta: The aortic root is dilated. Aneurysm present in the transverse aorta and descending aorta. DeBakey type III dissection present between the ascending aorta and the descending aorta.  Mitral valve: Trace regurgitation.  Right ventricle: Normal cavity size.  Tricuspid valve: Mild regurgitation.  Aorta: Graft present in the descending aorta and transverse aorta.        ______________________________________________________________________________________________     Recent Labs: 10/17/2023: Magnesium  2.3 04/19/2024: Pro Brain  Natriuretic Peptide 161.0 05/07/2024: ALT 10 05/09/2024: BUN 15; Creatinine, Ser  0.96; Hemoglobin 13.5; Platelets 278; Potassium 4.3; Sodium 136  Recent Lipid Panel    Component Value Date/Time   CHOL 138 07/30/2020 1108   TRIG 74 07/30/2020 1108   HDL 46 07/30/2020 1108   CHOLHDL 3.0 07/30/2020 1108   CHOLHDL 2.5 02/03/2015 0936   VLDL 12 02/03/2015 0936   LDLCALC 77 07/30/2020 1108    History of Present Illness    69 year old male with the above past medical history including coronary artery calcification, thoracic aortic dissection s/p replacement of ascending aorta, suspension of the aortic valve in 2016, s/p aortic arch debranching, replacement of the aortic arch in 2018, PAD s/p fem-fem bypass in 2016, spinal stroke, R lung mass, chronic pain syndrome, polysubstance abuse, and former tobacco use.   Previously followed by Dr. Mona, last seen 2019.   In 09/2014, patient underwent repair of the ascending aortic dissection with replacement of a ascending aorta and suspension of the aortic valve with cardiopulmonary bypass.  TEE during surgery revealed EF 60-65%. Patient later had progressive enlargement of the thoracic aorta measuring up to 5.6 cm. He underwent nuclear stress test in 11/2016 that showed no evidence of ischemia.  Echocardiogram in 11/2016 showed EF 60 to 65% with no wall motion abnormalities, grade 2 diastolic dysfunction.  There was trivial AI.  Aortic root and ascending aorta were normal in size. Patient underwent cardiac catheterization 12/2016 that showed widely patent, angiographically normal dominant RCA, normal LAD and left circumflex.  He underwent aortic arch deep branching and replacement of the aortic arch in 12/2016. Patient was seen in the ED on 04/19/2024 for evaluation of chest pain and worsening pain in bilateral lower legs.  Reported that he has had on and off chest pain since his dissection surgery several years ago.  He had reportedly gotten out of jail 2 weeks prior  and had not been taking his home medications since leaving.  In the ED, troponin 79> 61.  proBNP 161.  CMP grossly within normal limits.  CBC within normal limits.  Chest x-ray with right upper lobe mass, highly worrisome for primary bronchogenic carcinoma.  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  There was a 3.3x 3.2x 3.4 cm lung mass concerning for malignancy.  Patient was referred to hematology/oncology given findings concerning for lung mass and possible pancreatic neoplastic process.  He was also referred to cardiology.  He presents today for heart first clinic new patient evaluation, to reestablish care, accompanied by his sister.  He reports a several month history of near constant chest pain to his right upper chest, as well as back pain and bilateral leg pain.  He reports shortness of breath with at rest and with activity.  His activity is limited in the setting of prior spinal stroke, he is in a wheelchair today.  He reports occasional caffeine use, denies any alcohol or drug use.  History of prior tobacco use, he quit over 20 years ago.  He has not followed regularly with a healthcare provider.  He does not have a PCP.  He has not seen CT surgery, vascular surgery, cardiology in several years.  Home Medications    Current Outpatient Medications  Medication Sig Dispense Refill   aspirin  325 MG tablet Take 0.5 tablets (162.5 mg total) by mouth daily. (Patient not taking: Reported on 05/11/2024) 15 tablet  3   gabapentin  (NEURONTIN ) 300 MG capsule Take 2 capsules (600 mg total) by mouth 3 (three) times daily. (Patient not taking: Reported on 05/11/2024) 180 capsule 0   lidocaine  (LIDODERM ) 5 % Place 2 patches onto the skin daily. Remove & Discard patch within 12 hours  or as directed by MD (Patient not taking: Reported on 05/11/2024) 30 patch 0   Oxycodone  HCl 10 MG TABS Take 1 tablet (10 mg total) by mouth every 6 (six) hours as needed. (Patient not taking: Reported on 05/11/2024) 12 tablet 0   No current facility-administered medications for this visit.     Review of Systems    He denies palpitations, pnd, orthopnea, n, v, dizziness, syncope, edema, weight gain, or early satiety. All other systems reviewed and are otherwise negative except as noted above.   Physical Exam    VS:  BP 110/66   Pulse 90   Ht 5' 7 (1.702 m)   Wt 142 lb 1.3 oz (64.4 kg)   SpO2 96%   BMI 22.25 kg/m  GEN: Well nourished, well developed, in no acute distress. HEENT: normal. Neck: Supple, no JVD, R carotid bruit, no masses. Cardiac: RRR, 2/6 murmur, no  rubs, or gallops. No clubbing, cyanosis, edema.  Radials/DP/PT 2+ and equal bilaterally.  Respiratory:  Respirations regular and unlabored, clear to auscultation bilaterally. GI: Soft, nontender, nondistended, BS + x 4. MS: no deformity or atrophy. Skin: warm and dry, no rash. Neuro:  Strength and sensation are intact. Psych: Normal affect.  Accessory Clinical Findings    ECG personally reviewed by me today -    - no EKG in office today.   Lab Results  Component Value Date   WBC 6.5 05/09/2024   HGB 13.5 05/09/2024   HCT 42.7 05/09/2024   MCV 89.5 05/09/2024   PLT 278 05/09/2024   Lab Results  Component Value Date   CREATININE 0.96 05/09/2024   BUN 15 05/09/2024   NA 136 05/09/2024   K 4.3 05/09/2024   CL 101 05/09/2024   CO2 22 05/09/2024   Lab Results  Component Value Date   ALT 10 05/07/2024   AST 15 05/07/2024   ALKPHOS 87 05/07/2024   BILITOT 0.5 05/07/2024   Lab Results  Component Value Date   CHOL 138 07/30/2020   HDL 46 07/30/2020   LDLCALC 77 07/30/2020   TRIG 74 07/30/2020   CHOLHDL 3.0 07/30/2020    Lab Results  Component Value Date   HGBA1C 6.1 (H) 07/30/2020    Assessment &  Plan    1. Chest pain/coronary artery calcification noted on CT/elevated troponin: Patient previously had cath in 2018 that showed widely patent, angiographically normal coronary arteries.  Recent ED visit in the setting of chest pain, troponin was elevated.  He reports a several month history of near constant chest pain to his right upper chest.  Shortness of breath at rest and with activity.  Suspect his symptoms may be related to metastatic process.  However, given recent elevation in troponin, coronary calcification noted on prior CT, through shared decision making, will check coronary CT angiogram.  Will update echocardiogram.  Recent BMET, CBC stable.  Update fasting lipid panel, hemoglobin A1c.  Reviewed ED precautions.  2. Cardiac murmur: 2/6 murmur noted on exam today.  Reports shortness of breath with at rest and with activity.  Euvolemic and well compensated on exam. Echo pending as above.  3. Right carotid bruit: Noted on exam today.  Symptomatic.  Will check  bilateral carotid ultrasound.  4. Thoracic aortic aneurysm s/p repair: Patient previously had a type I dissection in 2015.  Had repair along with replacement of the ascending aorta and resuspension of the aortic valve.  He had subsequent progressive enlargement of the thoracic aorta.  Underwent surgical repair in 2018. CTA chest/ab/pelvis in the ED showed stable descending thoracic aortic dissection extending to the infrarenal portion of the abdominal aorta, extensive thoracic aortic stenting.  He has not had regular follow-up with CT surgery.  I suspect his ongoing cancer treatment/evaluation will take precedence, however, will reach out to Dr. Mona for recommendations for ongoing monitoring/management.  5. PAD: S/p fem-fem bypass in 2016.  He reports bilateral leg pain.  Pending pulmonology/oncology evaluation, results of cardiac testing, consider referral to vascular surgery.  Through shared decision making, will defer for now.   Continue aspirin .  6. History of spinal stroke: With residual weakness to legs bilaterally.  Continue aspirin .  7. Right lung mass: Concerning for malignancy.  Pending follow-up with pulmonology/oncology.  8. Disposition: Follow-up in 2 months.      Damien JAYSON Braver, NP 05/11/2024, 10:24 AM       [1] No Known Allergies  "

## 2024-05-11 ENCOUNTER — Other Ambulatory Visit (HOSPITAL_COMMUNITY): Payer: Self-pay

## 2024-05-11 ENCOUNTER — Ambulatory Visit: Attending: Nurse Practitioner | Admitting: Nurse Practitioner

## 2024-05-11 ENCOUNTER — Other Ambulatory Visit (HOSPITAL_BASED_OUTPATIENT_CLINIC_OR_DEPARTMENT_OTHER): Payer: Self-pay

## 2024-05-11 VITALS — BP 110/66 | HR 90 | Ht 67.0 in | Wt 142.1 lb

## 2024-05-11 DIAGNOSIS — R0602 Shortness of breath: Secondary | ICD-10-CM

## 2024-05-11 DIAGNOSIS — I2510 Atherosclerotic heart disease of native coronary artery without angina pectoris: Secondary | ICD-10-CM

## 2024-05-11 DIAGNOSIS — I712 Thoracic aortic aneurysm, without rupture, unspecified: Secondary | ICD-10-CM | POA: Diagnosis not present

## 2024-05-11 DIAGNOSIS — R072 Precordial pain: Secondary | ICD-10-CM

## 2024-05-11 DIAGNOSIS — G629 Polyneuropathy, unspecified: Secondary | ICD-10-CM | POA: Insufficient documentation

## 2024-05-11 DIAGNOSIS — G9511 Acute infarction of spinal cord (embolic) (nonembolic): Secondary | ICD-10-CM

## 2024-05-11 DIAGNOSIS — R918 Other nonspecific abnormal finding of lung field: Secondary | ICD-10-CM

## 2024-05-11 DIAGNOSIS — M5451 Vertebrogenic low back pain: Secondary | ICD-10-CM | POA: Insufficient documentation

## 2024-05-11 DIAGNOSIS — R011 Cardiac murmur, unspecified: Secondary | ICD-10-CM

## 2024-05-11 DIAGNOSIS — R0989 Other specified symptoms and signs involving the circulatory and respiratory systems: Secondary | ICD-10-CM

## 2024-05-11 DIAGNOSIS — I69959 Hemiplegia and hemiparesis following unspecified cerebrovascular disease affecting unspecified side: Secondary | ICD-10-CM | POA: Insufficient documentation

## 2024-05-11 DIAGNOSIS — M79605 Pain in left leg: Secondary | ICD-10-CM | POA: Insufficient documentation

## 2024-05-11 DIAGNOSIS — E782 Mixed hyperlipidemia: Secondary | ICD-10-CM | POA: Insufficient documentation

## 2024-05-11 DIAGNOSIS — R3911 Hesitancy of micturition: Secondary | ICD-10-CM | POA: Insufficient documentation

## 2024-05-11 DIAGNOSIS — N401 Enlarged prostate with lower urinary tract symptoms: Secondary | ICD-10-CM | POA: Insufficient documentation

## 2024-05-11 DIAGNOSIS — E559 Vitamin D deficiency, unspecified: Secondary | ICD-10-CM | POA: Insufficient documentation

## 2024-05-11 DIAGNOSIS — I739 Peripheral vascular disease, unspecified: Secondary | ICD-10-CM | POA: Diagnosis not present

## 2024-05-11 MED ORDER — METOPROLOL TARTRATE 50 MG PO TABS
50.0000 mg | ORAL_TABLET | Freq: Once | ORAL | 0 refills | Status: DC
  Filled 2024-05-11 – 2024-05-14 (×2): qty 1, 1d supply, fill #0

## 2024-05-11 NOTE — Patient Instructions (Addendum)
 Medication Instructions:  Your physician recommends that you continue on your current medications as directed. Please refer to the Current Medication list given to you today.  *If you need a refill on your cardiac medications before your next appointment, please call your pharmacy*  Lab Work: TODAY:  LIPID & HGBA1C (GO TO [address] FOR THIS)   If you have labs (blood work) drawn today and your tests are completely normal, you will receive your results only by: MyChart Message (if you have MyChart) OR A paper copy in the mail If you have any lab test that is abnormal or we need to change your treatment, we will call you to review the results.  Testing/Procedures: Your physician has requested that you have an echocardiogram. Echocardiography is a painless test that uses sound waves to create images of your heart. It provides your doctor with information about the size and shape of your heart and how well your hearts chambers and valves are working. This procedure takes approximately one hour. There are no restrictions for this procedure. Please do NOT wear cologne, perfume, aftershave, or lotions (deodorant is allowed). Please arrive 15 minutes prior to your appointment time.  Please note: We ask at that you not bring children with you during ultrasound (echo/ vascular) testing. Due to room size and safety concerns, children are not allowed in the ultrasound rooms during exams. Our front office staff cannot provide observation of children in our lobby area while testing is being conducted. An adult accompanying a patient to their appointment will only be allowed in the ultrasound room at the discretion of the ultrasound technician under special circumstances. We apologize for any inconvenience.   Your physician has requested that you have a carotid duplex. This test is an ultrasound of the carotid arteries in your neck. It looks at blood flow through these arteries that supply the brain with  blood. Allow one hour for this exam. There are no restrictions or special instructions.   Your physician has requested that you have cardiac CT. Cardiac computed tomography (CT) is a painless test that uses an x-ray machine to take clear, detailed pictures of your heart. For further information please visit [email or web address] Please follow instructions BELOW:    Your cardiac CT will be  scheduled at    [facility] [address]  [place], [place] [address]   1/27 arriving at 1130am   If scheduled at the Heart and Vascular Tower at [facility] street, please enter the parking lot using the [address] entrance and use the FREE valet service at the patient drop-off area. Enter the building and check-in with registration on the main floor.   Please follow these instructions carefully (unless otherwise directed):  An IV will be required for this test and Nitroglycerin  will be given.  Hold all erectile dysfunction medications at least 3 days (72 hrs) prior to test. (Ie viagra, cialis, sildenafil, tadalafil, etc)   On the Night Before the Test: Be sure to Drink plenty of water. Do not consume any caffeinated/decaffeinated beverages or chocolate 12 hours prior to your test. Do not take any antihistamines 12 hours prior to your test.   On the Day of the Test: Drink plenty of water until 1 hour prior to the test. Do not eat any food 1 hour prior to test. You may take your regular medications prior to the test.   Take metoprolol  (Lopressor ) 50 MG two hours prior to test.  PICK THIS UP ON  THE 1ST FLOOR AT OUR PHARMACY     After the Test: Drink plenty of water. After receiving IV contrast, you may experience a mild flushed feeling. This is normal. On occasion, you may experience a mild rash up to 24 hours after the test. This is not dangerous. If this occurs, you can take Benadryl  25 mg, Zyrtec, Claritin, or Allegra and increase your fluid intake.  (Patients taking Tikosyn should avoid Benadryl , and may take Zyrtec, Claritin, or Allegra) If you experience trouble breathing, this can be serious. If it is severe call 911 IMMEDIATELY. If it is mild, please call our office.  We will call to schedule your test 2-4 weeks out understanding that some insurance companies will need an authorization prior to the service being performed.   For more information and frequently asked questions, please visit our website : [email or web address]  For non-scheduling related questions, please contact the cardiac imaging nurse navigator should you have any questions/concerns: Cardiac Imaging Nurse Navigators Direct Office Dial: [phone]   For scheduling needs, including cancellations and rescheduling, please call Brittany, [phone].     Follow-Up: At [facility], you and your health needs are our priority.  As part of our continuing mission to provide you with exceptional heart care, our providers are all part of one team.  This team includes your primary Cardiologist (physician) and Advanced Practice Providers or APPs (Physician Assistants and Nurse Practitioners) who all work together to provide you with the care you need, when you need it.  Your next appointment:   2 month(s)  Provider:   [name], MD    We recommend signing up for the patient portal called MyChart.  Sign up information is provided on this After Visit Summary.  MyChart is used to connect with patients for Virtual Visits (Telemedicine).  Patients are able to view lab/test results, encounter notes, upcoming appointments, etc.  Non-urgent messages can be sent to your provider as well.   To learn more about what you can do with MyChart, go to [email or web address].   Other Instructions

## 2024-05-12 LAB — LIPID PANEL
Chol/HDL Ratio: 2.7 ratio (ref 0.0–5.0)
Cholesterol, Total: 152 mg/dL (ref 100–199)
HDL: 56 mg/dL
LDL Chol Calc (NIH): 83 mg/dL (ref 0–99)
Triglycerides: 64 mg/dL (ref 0–149)
VLDL Cholesterol Cal: 13 mg/dL (ref 5–40)

## 2024-05-12 LAB — HEMOGLOBIN A1C
Est. average glucose Bld gHb Est-mCnc: 120 mg/dL
Hgb A1c MFr Bld: 5.8 % — ABNORMAL HIGH (ref 4.8–5.6)

## 2024-05-13 ENCOUNTER — Encounter (HOSPITAL_COMMUNITY): Payer: Self-pay | Admitting: Pharmacy Technician

## 2024-05-13 ENCOUNTER — Emergency Department (HOSPITAL_COMMUNITY)
Admission: EM | Admit: 2024-05-13 | Discharge: 2024-05-13 | Disposition: A | Discharge status: Home / Self Care | Attending: Emergency Medicine | Admitting: Emergency Medicine

## 2024-05-13 ENCOUNTER — Emergency Department (HOSPITAL_COMMUNITY)

## 2024-05-13 ENCOUNTER — Other Ambulatory Visit: Payer: Self-pay

## 2024-05-13 DIAGNOSIS — I2510 Atherosclerotic heart disease of native coronary artery without angina pectoris: Secondary | ICD-10-CM | POA: Diagnosis not present

## 2024-05-13 DIAGNOSIS — I77811 Abdominal aortic ectasia: Secondary | ICD-10-CM | POA: Insufficient documentation

## 2024-05-13 DIAGNOSIS — Z7982 Long term (current) use of aspirin: Secondary | ICD-10-CM | POA: Insufficient documentation

## 2024-05-13 DIAGNOSIS — R918 Other nonspecific abnormal finding of lung field: Secondary | ICD-10-CM | POA: Insufficient documentation

## 2024-05-13 DIAGNOSIS — I10 Essential (primary) hypertension: Secondary | ICD-10-CM | POA: Diagnosis not present

## 2024-05-13 DIAGNOSIS — R079 Chest pain, unspecified: Secondary | ICD-10-CM

## 2024-05-13 DIAGNOSIS — M79604 Pain in right leg: Secondary | ICD-10-CM | POA: Insufficient documentation

## 2024-05-13 DIAGNOSIS — M79605 Pain in left leg: Secondary | ICD-10-CM | POA: Diagnosis not present

## 2024-05-13 DIAGNOSIS — R072 Precordial pain: Secondary | ICD-10-CM | POA: Diagnosis present

## 2024-05-13 DIAGNOSIS — Z8673 Personal history of transient ischemic attack (TIA), and cerebral infarction without residual deficits: Secondary | ICD-10-CM | POA: Insufficient documentation

## 2024-05-13 DIAGNOSIS — I71019 Dissection of thoracic aorta, unspecified: Secondary | ICD-10-CM | POA: Insufficient documentation

## 2024-05-13 LAB — CBC
HCT: 47.7 % (ref 39.0–52.0)
Hemoglobin: 15.5 g/dL (ref 13.0–17.0)
MCH: 28.7 pg (ref 26.0–34.0)
MCHC: 32.5 g/dL (ref 30.0–36.0)
MCV: 88.2 fL (ref 80.0–100.0)
Platelets: 392 K/uL (ref 150–400)
RBC: 5.41 MIL/uL (ref 4.22–5.81)
RDW: 13.5 % (ref 11.5–15.5)
WBC: 7.4 K/uL (ref 4.0–10.5)
nRBC: 0.3 % — ABNORMAL HIGH (ref 0.0–0.2)

## 2024-05-13 LAB — TROPONIN T, HIGH SENSITIVITY
Troponin T High Sensitivity: 53 ng/L — ABNORMAL HIGH (ref 0–19)
Troponin T High Sensitivity: 46 ng/L — ABNORMAL HIGH (ref 0–19)

## 2024-05-13 LAB — PRO BRAIN NATRIURETIC PEPTIDE: Pro Brain Natriuretic Peptide: 300 pg/mL — ABNORMAL HIGH

## 2024-05-13 LAB — BASIC METABOLIC PANEL WITH GFR
Anion gap: 10 (ref 5–15)
BUN: 11 mg/dL (ref 8–23)
CO2: 25 mmol/L (ref 22–32)
Calcium: 9.4 mg/dL (ref 8.9–10.3)
Chloride: 103 mmol/L (ref 98–111)
Creatinine, Ser: 0.95 mg/dL (ref 0.61–1.24)
GFR, Estimated: >60 mL/min
Glucose, Bld: 91 mg/dL (ref 70–99)
Potassium: 4.5 mmol/L (ref 3.5–5.1)
Sodium: 137 mmol/L (ref 135–145)

## 2024-05-13 LAB — CK: Total CK: 44 U/L — ABNORMAL LOW (ref 49–397)

## 2024-05-13 MED ORDER — TIZANIDINE HCL 4 MG PO TABS: 4.0000 mg | ORAL_TABLET | Freq: Four times a day (QID) | ORAL | 0 refills | Status: DC | PRN

## 2024-05-13 MED ORDER — TIZANIDINE HCL 4 MG PO TABS
4.0000 mg | ORAL_TABLET | Freq: Four times a day (QID) | ORAL | 0 refills | Status: DC | PRN
  Filled 2024-05-13 – 2024-05-18 (×2): qty 30, 8d supply, fill #0

## 2024-05-13 MED ORDER — IOHEXOL 350 MG/ML SOLN
100.0000 mL | Freq: Once | INTRAVENOUS | Status: AC | PRN
  Administered 2024-05-13: 100 mL via INTRAVENOUS

## 2024-05-13 MED ORDER — OXYCODONE HCL 5 MG PO TABS
5.0000 mg | ORAL_TABLET | Freq: Once | ORAL | Status: DC
  Filled 2024-05-13: qty 1

## 2024-05-13 NOTE — ED Triage Notes (Signed)
 Pt here via EMS with reports of chest pain and bil leg pain. Recently dx with mass in chest, same pain he has been having since the diagnoses. Chest is tender to palpation.

## 2024-05-13 NOTE — ED Provider Notes (Signed)
 " [name]GENCY [facility] AT [name] Note   CSN: [id number] Arrival date & time: 05/13/24  1752     History Chief Complaint  Patient presents with   Chest Pain     Chest Pain  HPI: [name] is a 69 y.o. 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 Patient arrived via EMS from home.  History provided by patient.  No interpreter required during this encounter.  Patient reports that he has had substernal chest pressure and pain that has been exertional over the past 2 months.  Reports that it has been worsening over time.  Reports that he does occasionally have pain that radiates to his back.  States that he previously had an aortic dissection, however it was approximately 10 years ago, and he is unable to specify whether or not the pain currently is similar to previous pain, or if it was different because he states that it is too long ago for him to specifically remember.  Reports that he also has bilateral pain and soreness in his bilateral lower extremities, denies any swelling.  Denies any recent fevers, chills, nausea, vomiting, diarrhea.  Patient's recorded medical, surgical, social, medication list and allergies were reviewed in the Snapshot window as part of the initial history.   Prior to Admission medications  Medication Sig Start Date End Date Taking? Authorizing Provider  aspirin  325 MG tablet Take 0.5 tablets (162.5 mg total) by mouth daily. Patient not taking: Reported on 05/11/2024 09/06/18   Whitfield Raisin, NP  gabapentin  (NEURONTIN ) 300 MG capsule Take 2 capsules (600 mg total) by mouth 3 (three) times daily. Patient not taking: Reported on 05/11/2024 10/17/23   Sherrine Birmingham, DO  lidocaine  (LIDODERM ) 5 % Place 2 patches onto the skin daily. Remove & Discard patch within 12 hours or as directed by  MD Patient not taking: Reported on 05/11/2024 05/10/24   Gherghe, Costin M, MD  metoprolol  tartrate (LOPRESSOR ) 50 MG tablet Take 1 tablet (50 mg total) by mouth once. Take 90-120 minutes prior to scan. Hold for SBP less than 110. 05/11/24 05/15/24  Monge, Damien BROCKS, NP  oxyCODONE  10 MG TABS Take 1 tablet (10 mg total) by mouth every 4 (four) hours as needed for severe pain (pain score 7-10). 05/15/24   Tegeler, Lonni PARAS, MD  Oxycodone  HCl 10 MG TABS Take 1 tablet (10 mg total) by mouth every 6 (six) hours as needed. Patient not taking: Reported on 05/11/2024 05/09/24   Gherghe, Costin M, MD  tiZANidine  (ZANAFLEX ) 4 MG tablet Take 1 tablet (4 mg total) by mouth every 6 (six) hours as needed for muscle spasms. 05/13/24   Rogelia Jerilynn RAMAN, MD     Allergies: Patient has no known allergies.   Review of Systems   ROS as per HPI  Physical Exam Updated Vital Signs BP (!) 124/92 (BP Location: Right Arm)   Pulse 88   Temp 98.8 F (37.1 C) (Oral)   Resp 18   SpO2 100%  Physical Exam Vitals and nursing note reviewed.  Constitutional:      General: He is not in acute distress.    Appearance: He is well-developed.  HENT:     Head: Normocephalic and atraumatic.  Eyes:     Conjunctiva/sclera: Conjunctivae normal.  Cardiovascular:     Rate and Rhythm: Normal rate and regular rhythm.  Pulses:          Radial pulses are 2+ on the right side and 2+ on the left side.     Heart sounds: No murmur heard. Pulmonary:     Effort: Pulmonary effort is normal. No respiratory distress.     Breath sounds: Normal breath sounds.  Chest:     Chest wall: Tenderness (Right sided chest wall tenderness to palpation) present.  Abdominal:     Palpations: Abdomen is soft.     Tenderness: There is no abdominal tenderness.  Musculoskeletal:        General: No swelling.     Cervical back: Neck supple.     Right lower leg: No tenderness. No edema.     Left lower leg: No tenderness. No edema.  Skin:    General:  Skin is warm and dry.     Capillary Refill: Capillary refill takes less than 2 seconds.  Neurological:     Mental Status: He is alert.  Psychiatric:        Mood and Affect: Mood normal.     ED Course/ Medical Decision Making/ A&P  Procedures Procedures   Medications Ordered in ED Medications  iohexol  (OMNIPAQUE ) 350 MG/ML injection 100 mL (100 mLs Intravenous Contrast Given 05/13/24 2119)    Medical Decision Making:   [name] is a 69 y.o. male who presents for chest pain radiating to his back, bilateral leg pain without swelling, all of which have been intermittent for months as per above.  Physical exam is pertinent for sided chest wall tenderness to palpation.   The differential includes but is not limited to ACS, arrhythmia, pericardial tamponade, pericarditis, myocarditis, pneumonia, pneumothorax, esophageal, tear, perforated abdominal viscous, pulmonary embolism, aortic dissection, costochondritis, musculoskeletal chest wall pain, GERD.  Independent historian: None  External data reviewed: Labs: reviewed prior labs for baseline and Notes: Reviewed patient's recent discharge summary, patient presented similarly earlier this month and was admitted from 1/13 through 1/14 for right-sided chest pain, which was felt to be due to his lung mass.  Patient did have elevated troponins at that time, which had been previously demonstrated, and were not felt to be due to to ACS.  Pain was ultimately attributed to underlying right lung mass, patient was discharged with prescription for oxycodone  and plans for close follow-up with his pulmonology and oncology team  Initial Plan:  Screening labs including CBC and Metabolic panel to evaluate for infectious or metabolic etiology of disease.  CK given patient reports generalized lower extremity pain Chest x-ray to evaluate for structural/infectious intra-thoracic pathology.  CTA dissection study to evaluate for  structural/infectious/vascular aortic pathology EKG/Troponin testing/BNP testing to evaluate for cardiac pathology. Objective evaluation as below reviewed   Labs: Ordered, Independent interpretation, and Details: BMP without AKI, emergent electrolyte derangement, emergent LFT abnormality, CBC without leukocytosis, anemia, thrombocytopenia.  BNP minimally elevated at 300.  CK below lower limit of normal at 44. Troponin initially elevated at 53, delta 46, patient's baseline troponin elevated to 50s to 70s over the past month  Radiology: Ordered, Independent interpretation, Details: Personally reviewed chest x-ray, I do appreciate mass in the right upper lobe, do not appreciate cardiomediastinal silhouette derangement, I do appreciate what appears to be endovascular repair of thoracic aorta.  CT dissection study obtained, does demonstrate chronic dissection and thrombus, and All images reviewed independently.  Agree with radiology report at this time.   CT Angio Chest/Abd/Pel for Dissection W and/or Wo Contrast Result Date: 05/13/2024 EXAM: CTA CHEST,  ABDOMEN AND PELVIS WITH AND WITHOUT CONTRAST 05/13/2024 09:27:37 PM TECHNIQUE: CTA of the chest was performed with and without the administration of intravenous contrast. CTA of the abdomen and pelvis was performed with and without the administration of intravenous contrast. 100 mL of iohexol  (OMNIPAQUE ) 350 MG/ML injection was administered. Multiplanar reformatted images are provided for review. MIP images are provided for review. Automated exposure control, iterative reconstruction, and/or weight based adjustment of the mA/kV was utilized to reduce the radiation dose to as low as reasonably achievable. COMPARISON: 05/09/2024 CLINICAL HISTORY: Acute aortic syndrome (AAS) suspected. FINDINGS: VASCULATURE: AORTA: Tube graft repair in the ascending thoracic aorta. Stent graft repair from the aortic arch through the descending thoracic aorta. Aneurysmal dilatation of  the abdominal aorta measuring up to 5.7 cm below the stent graft. Just below the stent graft and at the level of the renal arteries , there is a partially thrombosed false lumen from a chronic dissection. The false lumen has further thrombosed since the prior study from 04/19/2024. Below the renal arteries, the aorta is normal caliber with atherosclerotic calcifications. PULMONARY ARTERIES: No pulmonary embolism with the limits of this exam. GREAT VESSELS OF AORTIC ARCH: No acute finding. No dissection. No arterial occlusion or significant stenosis. CELIAC TRUNK: No acute finding. No occlusion or significant stenosis. SUPERIOR MESENTERIC ARTERY: No acute finding. No occlusion or significant stenosis. INFERIOR MESENTERIC ARTERY: No acute finding. No occlusion or significant stenosis. RENAL ARTERIES: No acute finding. No occlusion or significant stenosis. 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 No occlusion or significant stenosis. CHEST: MEDIASTINUM: Cardiomegaly. Right paratracheal lymph node measures up to 11 mm in short axis diameter, stable. The heart and pericardium demonstrate no acute abnormality. LUNGS AND PLEURA: Right upper lobe mass is again noted, measuring 3.5 cm, stable since prior study. Peripheral mass in the right upper lobe measures up to 2.7 cm in greatest diameter, also stable. Bronchial wall thickening and atelectasis in the lower lobes bilaterally. Centrilobular and paraseptal emphysema. No focal consolidation or pulmonary edema. No evidence of pleural effusion or pneumothorax. THORACIC BONES AND SOFT TISSUES: No acute bone or soft tissue abnormality. ABDOMEN AND PELVIS: LIVER: The liver is unremarkable. GALLBLADDER AND BILE DUCTS: The gallbladder is contracted. No biliary ductal dilatation. SPLEEN: The spleen is unremarkable. PANCREAS: The pancreas is unremarkable. ADRENAL GLANDS: Bilateral adrenal glands demonstrate no acute abnormality. KIDNEYS,  URETERS AND BLADDER: Small bilateral renal cysts appear benign. No stones in the kidneys or ureters. No hydronephrosis. No perinephric or periureteral stranding. Urinary bladder is unremarkable. GI AND BOWEL: Large left inguinal hernia containing multiple small bowel loops, unchanged. Stomach and duodenal sweep demonstrate no acute abnormality. There is no bowel obstruction. No abnormal bowel wall thickening or distension. REPRODUCTIVE: Reproductive organs are unremarkable. PERITONEUM AND RETROPERITONEUM: No ascites or free air. LYMPH NODES: No lymphadenopathy. ABDOMINAL BONES AND SOFT TISSUES: No acute abnormality of the bones. No acute soft tissue abnormality. IMPRESSION: 1. 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 2. Right upper lobe mass measuring 3.5 cm, stable. 2.7 cm peripheral right upper lobe mass, also stable. 3. 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 4. Large left inguinal hernia containing multiple small bowel loops, unchanged, without bowel obstruction. Electronically signed by: Franky Crease MD 05/13/2024 09:41 PM EST RP Workstation: [id number]   DG Chest  2 View Result Date: 05/13/2024 EXAM: 2 VIEW(S) XRAY OF THE CHEST 05/13/2024 06:18:00 PM COMPARISON: 05/08/2024 CLINICAL HISTORY: chest pain FINDINGS: LINES, TUBES AND DEVICES: Descending thoracic aortic stent is noted. LUNGS AND PLEURA: Persistent lateral right upper lobe lung mass is present. Slightly increased patchy airspace opacity is noted at the right lung base. No pleural effusion. No pneumothorax. HEART AND MEDIASTINUM: No acute abnormality of the cardiac and mediastinal silhouettes. BONES AND SOFT TISSUES: Post median sternotomy is noted. Intact sternotomy wires are present. No acute osseous abnormality.  IMPRESSION: 1. Slightly increased patchy airspace opacity worrisome for infection . 2. Persistent lateral right upper lobe lung mass. Electronically signed by: Greig Pique MD 05/13/2024 06:28 PM EST RP Workstation: [id number]   EKG/Medicine tests: Ordered, Independent interpretation, and Details: Rate 92, sinus rhythm, no ST elevations or depressions, no acute ischemic changes  Interventions: Patient declined of oxycodone  in the ED.  See the EMR for full details regarding lab and imaging results.  Mr. Kaminsky is awake, alert, and HDS.  His exam is most notable for chest wall tenderness.  The ECG reveals no anatomical ischemia representing STEMI, New-Onset Arrhythmia, or ischemic equivalent.  He has been risk stratified with a HEAR score of he 5. Initial troponin is 53; delta troponin is 46.  Although his troponins are elevated, this is at baseline for the patient, patient previously had troponins elevated to a similar level during his previous hospitalization, and this was not felt to be due to underlying ACS.  The patient's presentation, the patient being hemodynamically stable, and the ECG are not consistent with Pericardial Tamponade. The patient's pain is not positional. This in conjunction with the lack of PR depressions and ST elevations on the ECG are reassuring against Pericarditis. The patient's baseline elevation troponin and ECG are also inconsistent with Myocarditis.  Initial chest x-ray was concerning for possible underlying pneumonia, however patient does not have productive cough, afebrile, does not have leukocytosis, clinical picture is not consistent with pneumonia.  CT PE study was obtained due to increased risk for pulmonary embolism given patient's age, no malignancy, and there is no evidence of pneumonia on CT PE study.  There is no evidence of Pneumothorax on physical exam or on the CXR. CXR shows no evidence of Esophageal Tear and there is no recent intractable emesis or  esophageal instrumentation. There is no peritonitis or free air on CXR worrisome for a Perforated Abdominal Viscous.  Pulmonary Embolism is on the differential. The patient is at risk via the Revised Geneva Criteria.  Given patient is at high risk in the setting of malignancy, and also to better evaluate the patient's known mass as this could be contributing to his pain.  CT dissection study obtained and fortunately does not show evidence of acute pulmonary embolism.  Given patient's history and location of pain, I do have concern for Aortic Dissection.  Do feel that patient warrants dissection study, this was obtained and does not demonstrate evidence of new dissection.  CK was also obtained to evaluate for possible rhabdomyolysis given patient reports generalized/fatigue, WNL, no evidence of rhabdomyolysis, no leukocytosis to suggest underlying infectious/inflammatory etiology, BMP reassuring, no abnormalities to suggest significant metabolic derangement.  Notably, patient does have worsening of chronic thrombus inside known dissection in his abdominal aorta, patient is not on any anticoagulation at baseline, I consulted vascular surgery, spoke with Dr. 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  Will provide patient referral  to follow-up with vascular surgery clinic.  Overall, feel that patient's presentation and right-sided chest pain is most consistent with pain related to his known underlying right-sided malignancy, did discuss this with patient.  Patient was recently prescribed a course of oxycodone  upon his recent discharge, and was informed that he needed to follow-up with his pulmonology and oncology team for further refills.  Appears that patient never picked up this prescription, advised him that he should pick up this prescription and use it for pain control, and follow-up closely with his oncology and  pulmonology teams.  Additionally given patient does have component of reproducible pain on palpation of chest wall, do feel that there is likely a concurrent musculoskeletal component, therefore will prescribe course of tizanidine .  Patient expresses understanding, comfortable with this plan, discharged in stable condition.  Presentation is most consistent with acute complicated illness, Current presentation is complicated by underlying chronic conditions, and I did consider and rule out acute life/limb-threatening illness  Discussion of management or test interpretations with external provider(s): Dr. Serene, vascular surgery  Risk Drugs:Prescription drug management  Disposition: DISCHARGE: I believe that the patient is safe for discharge home with outpatient follow-up. Patient was informed of all pertinent physical exam, laboratory, and imaging findings including worsening of chronic thrombus and known aortic dissection.  Patient's suspected etiology of their symptom presentation was discussed with the patient and all questions were answered. We discussed following up with PCP, pulmonology, oncology, vascular surgery.. I provided thorough ED return precautions. The patient feels safe and comfortable with this plan.  MDM generated using voice dictation software and may contain dictation errors.  Please contact me for any clarification or with any questions.  Clinical Impression:  1. Dissection of thoracic aorta, unspecified part (HCC)   2. Chest pain, unspecified type      Discharge   Final Clinical Impression(s) / ED Diagnoses Final diagnoses:  Dissection of thoracic aorta, unspecified part (HCC)  Chest pain, unspecified type    Rx / DC Orders ED Discharge Orders          Ordered    Ambulatory referral to Cardiology       Comments: If you have not heard from the Cardiology office within the next 72 hours please call [phone].   05/13/24 2230    Ambulatory referral to Vascular  Surgery        05/13/24 2230    tiZANidine  (ZANAFLEX ) 4 MG tablet  Every 6 hours PRN,   Status:  Discontinued        05/13/24 2230    tiZANidine  (ZANAFLEX ) 4 MG tablet  Every 6 hours PRN        05/13/24 2230             Rogelia Jerilynn RAMAN, MD 05/18/24 1304  "

## 2024-05-13 NOTE — Discharge Instructions (Addendum)
 Victor [name]  Thank you for allowing us  to take care of you today.  You came to the Emergency Department today because you have had chest pain chronically over the past 2 months.  Here in the emergency department your heart number was where they usually were and where they were on your latest admission.  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  We have talked with the vascular surgeons, and there is no specific treatment that you need for this right now, this is in your belly so it is unlikely to be the cause of your chest pain, you can follow-up with them in clinic, we have given you a referral to follow-up with them in clinic.  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  You can use Tylenol  and ibuprofen as needed for pain control.  Additionally we are prescribing a muscle relaxer called tizanidine  that you can use every 8 hours as needed for muscle pain/spasms.  Additionally it looks like you were recently prescribed a 3-day course of oxycodone  and it was sent to the [facility], please see the prescription details listed at the end of this page.  You can go to the [facility] and ask them to fill this oxycodone  prescription, as well as your tizanidine  muscle relaxer.  You should follow-up with your pulmonologist/oncology team for further refills of these medications.  Call them tomorrow to schedule an appointment.   To-Do: 1. Please follow-up with your primary doctor within 7 days / as soon as possible.    Please return to the Emergency Department or call 911 if you experience have worsening of your symptoms, or do not get better, chest pain, shortness of breath, severe or significantly worsening pain, high fever,  severe confusion, pass out or have any reason to think that you need emergency medical care.   We hope you feel better soon.   Mitzie Later, MD Department of Emergency Medicine [facility]       Rx #: [id number] Oxycodone  HCl 10 MG TABS [[id number]]  Order Details Dose: 10 mg Route: Oral Frequency: Every 6 hours PRN  Dispense Quantity: 12 tablet (3 day supply) Refills: 0 Fills remaining: 0  Duration: -- Dispense As Written: No        Sig: Take 1 tablet (10 mg total) by mouth every 6 (six) hours as needed.  Patient not taking: Reported on 05/11/2024       Start Date: 05/09/24 End Date: --  Written Date: 05/09/24 Expiration Date: 11/05/24  Earliest Fill Date: 05/09/24    Original Order: Oxycodone  HCl 10 MG TABS [[id number]]  Providers  Ordering and Authorizing Provider: Trixie Nilda HERO, MD [address], [place] [address] Phone: [phone]   Fax: [phone] DEA #: [id number]   NPI: [id number]

## 2024-05-14 ENCOUNTER — Other Ambulatory Visit (HOSPITAL_BASED_OUTPATIENT_CLINIC_OR_DEPARTMENT_OTHER): Payer: Self-pay

## 2024-05-14 ENCOUNTER — Encounter: Admitting: Student in an Organized Health Care Education/Training Program

## 2024-05-14 ENCOUNTER — Encounter: Payer: Self-pay | Admitting: Physician Assistant

## 2024-05-14 ENCOUNTER — Other Ambulatory Visit (HOSPITAL_COMMUNITY): Payer: Self-pay

## 2024-05-14 ENCOUNTER — Encounter: Payer: Self-pay | Admitting: Nurse Practitioner

## 2024-05-15 ENCOUNTER — Other Ambulatory Visit: Payer: Self-pay

## 2024-05-15 ENCOUNTER — Emergency Department (HOSPITAL_COMMUNITY)

## 2024-05-15 ENCOUNTER — Encounter (HOSPITAL_COMMUNITY): Payer: Self-pay | Admitting: Emergency Medicine

## 2024-05-15 ENCOUNTER — Emergency Department (HOSPITAL_COMMUNITY)
Admission: EM | Admit: 2024-05-15 | Discharge: 2024-05-15 | Disposition: A | Discharge status: Home / Self Care | Attending: Emergency Medicine | Admitting: Emergency Medicine

## 2024-05-15 DIAGNOSIS — M79604 Pain in right leg: Secondary | ICD-10-CM | POA: Diagnosis not present

## 2024-05-15 DIAGNOSIS — I2510 Atherosclerotic heart disease of native coronary artery without angina pectoris: Secondary | ICD-10-CM | POA: Diagnosis not present

## 2024-05-15 DIAGNOSIS — Z8673 Personal history of transient ischemic attack (TIA), and cerebral infarction without residual deficits: Secondary | ICD-10-CM | POA: Insufficient documentation

## 2024-05-15 DIAGNOSIS — R918 Other nonspecific abnormal finding of lung field: Secondary | ICD-10-CM

## 2024-05-15 DIAGNOSIS — I10 Essential (primary) hypertension: Secondary | ICD-10-CM | POA: Insufficient documentation

## 2024-05-15 DIAGNOSIS — Z7982 Long term (current) use of aspirin: Secondary | ICD-10-CM | POA: Diagnosis not present

## 2024-05-15 DIAGNOSIS — R0789 Other chest pain: Secondary | ICD-10-CM

## 2024-05-15 DIAGNOSIS — R079 Chest pain, unspecified: Secondary | ICD-10-CM | POA: Diagnosis present

## 2024-05-15 DIAGNOSIS — M549 Dorsalgia, unspecified: Secondary | ICD-10-CM | POA: Insufficient documentation

## 2024-05-15 DIAGNOSIS — M79605 Pain in left leg: Secondary | ICD-10-CM | POA: Diagnosis not present

## 2024-05-15 LAB — CBC
HCT: 46.1 % (ref 39.0–52.0)
Hemoglobin: 14.7 g/dL (ref 13.0–17.0)
MCH: 28.2 pg (ref 26.0–34.0)
MCHC: 31.9 g/dL (ref 30.0–36.0)
MCV: 88.3 fL (ref 80.0–100.0)
Platelets: 362 K/uL (ref 150–400)
RBC: 5.22 MIL/uL (ref 4.22–5.81)
RDW: 13.5 % (ref 11.5–15.5)
WBC: 6.7 K/uL (ref 4.0–10.5)
nRBC: 0 % (ref 0.0–0.2)

## 2024-05-15 LAB — TROPONIN T, HIGH SENSITIVITY
Troponin T High Sensitivity: 43 ng/L — ABNORMAL HIGH (ref 0–19)
Troponin T High Sensitivity: 40 ng/L — ABNORMAL HIGH (ref 0–19)

## 2024-05-15 LAB — BASIC METABOLIC PANEL WITH GFR
Anion gap: 10 (ref 5–15)
BUN: 11 mg/dL (ref 8–23)
CO2: 23 mmol/L (ref 22–32)
Calcium: 8.7 mg/dL — ABNORMAL LOW (ref 8.9–10.3)
Chloride: 103 mmol/L (ref 98–111)
Creatinine, Ser: 1.07 mg/dL (ref 0.61–1.24)
GFR, Estimated: >60 mL/min
Glucose, Bld: 82 mg/dL (ref 70–99)
Potassium: 5.1 mmol/L (ref 3.5–5.1)
Sodium: 136 mmol/L (ref 135–145)

## 2024-05-15 MED ORDER — OXYCODONE HCL 10 MG PO TABS: 10.0000 mg | ORAL_TABLET | ORAL | 0 refills | Status: DC | PRN

## 2024-05-15 MED ORDER — IOHEXOL 350 MG/ML SOLN
100.0000 mL | Freq: Once | INTRAVENOUS | Status: AC | PRN
  Administered 2024-05-15: 100 mL via INTRAVENOUS

## 2024-05-15 MED ORDER — HYDROMORPHONE HCL 1 MG/ML IJ SOLN
0.5000 mg | Freq: Once | INTRAMUSCULAR | Status: AC
  Administered 2024-05-15: 0.5 mg via INTRAVENOUS
  Filled 2024-05-15 (×2): qty 1

## 2024-05-15 MED ORDER — HYDROMORPHONE HCL 1 MG/ML IJ SOLN
0.5000 mg | Freq: Once | INTRAMUSCULAR | Status: AC
  Administered 2024-05-15: 0.5 mg via INTRAVENOUS
  Filled 2024-05-15: qty 1

## 2024-05-15 MED ORDER — OXYCODONE HCL 5 MG PO TABS
10.0000 mg | ORAL_TABLET | Freq: Once | ORAL | Status: AC
  Administered 2024-05-15: 10 mg via ORAL
  Filled 2024-05-15: qty 2

## 2024-05-15 NOTE — Discharge Instructions (Signed)
 Your history, exam, workup today showed stable appearing aorta with the known aneurysm areas and dissections and parts that have been repaired and it still showed evidence of that lung mass.  Given your discomfort, I suspect the pain may be related to that mass.  As you report the pain medicine helped you in the past but you do not have anymore, we will give a short prescription for pain medicine but you need to follow-up with your primary doctor and oncologist for further management.  Please rest and stay hydrated.  If any symptoms change or worsen acutely, please return to the nearest emergency department.

## 2024-05-15 NOTE — ED Triage Notes (Signed)
 Pt BIB [facility] from home with reports of CP, back pain, and bilateral leg pain. Pt was given 1 nitroglycerin  with no relief.

## 2024-05-15 NOTE — ED Provider Notes (Signed)
 " [place] EMERGENCY DEPARTMENT AT [facility] Provider Note   CSN: [id number] Arrival date & time: 05/15/24  1352     Patient presents with: Chest Pain   Victor [name] is a 69 y.o. male.   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  Says that he has a longstanding history of the symptoms.  Worsened over the past few days.  Was seen on 1/18 and had a CTA that showed chronic dissection without any acute findings.  Was discharged home but reports that the pain got worse.  Was supposed to be prescribed oxycodone  but says when he got to the pharmacy was just a muscle relaxer.  Says he was given some nitroglycerin  here but it did not help at all.  Currently pain 9/10.  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  Does have chronic pain and is prescribed oxycodone  at home       Prior to Admission medications  Medication Sig Start Date End Date Taking? Authorizing Provider  aspirin  325 MG tablet Take 0.5 tablets (162.5 mg total) by mouth daily. Patient not taking: Reported on 05/11/2024 09/06/18   Whitfield Raisin, NP  gabapentin  (NEURONTIN ) 300 MG capsule Take 2 capsules (600 mg total) by mouth 3 (three) times daily. Patient not taking: Reported on 05/11/2024 10/17/23   Sherrine Birmingham, DO  lidocaine  (LIDODERM ) 5 % Place 2 patches onto the skin daily. Remove & Discard patch within 12 hours or as directed by MD Patient not taking: Reported on 05/11/2024 05/10/24   Gherghe, Costin M, MD  metoprolol  tartrate (LOPRESSOR ) 50 MG tablet Take 1 tablet (50 mg total) by mouth once. Take 90-120 minutes prior to scan. Hold for SBP less than 110. 05/11/24 05/15/24  Monge, Damien BROCKS, NP  Oxycodone  HCl 10 MG TABS Take 1 tablet (10 mg total) by mouth every 6 (six) hours as needed. Patient not taking: Reported on 05/11/2024  05/09/24   Trixie Nilda HERO, MD  tiZANidine  (ZANAFLEX ) 4 MG tablet Take 1 tablet (4 mg total) by mouth every 6 (six) hours as needed for muscle spasms. 05/13/24   Rogelia Jerilynn RAMAN, MD    Allergies: Patient has no known allergies.    Review of Systems  Updated Vital Signs BP 128/84 (BP Location: Right Arm)   Pulse 80   Temp 99 F (37.2 C) (Oral)   Resp 16   SpO2 100%   Physical Exam Vitals and nursing note reviewed.  Constitutional:      General: He is not in acute distress.    Appearance: He is well-developed.  HENT:     Head: Normocephalic and atraumatic.     Right Ear: External ear normal.     Left Ear: External ear normal.     Nose: Nose normal.  Eyes:     Extraocular Movements: Extraocular movements intact.     Conjunctiva/sclera: Conjunctivae normal.     Pupils: Pupils are equal, round, and reactive to light.  Cardiovascular:     Rate and Rhythm: Normal rate and regular rhythm.     Heart sounds: Normal heart sounds.  Pulmonary:     Effort: Pulmonary effort is normal. No respiratory distress.     Breath sounds: Normal breath sounds.  Musculoskeletal:     Cervical back: Normal range of motion and neck supple.  Right lower leg: No edema.     Left lower leg: No edema.     Comments: Left radial and left DP pulse 1+ but both extremities appear warm and well-perfused.  Right radial and right DP pulse 2+.  Skin:    General: Skin is warm and dry.  Neurological:     Mental Status: He is alert. Mental status is at baseline.  Psychiatric:        Mood and Affect: Mood normal.        Behavior: Behavior normal.     (all labs ordered are listed, but only abnormal results are displayed) Labs Reviewed  BASIC METABOLIC PANEL WITH GFR - Abnormal; Notable for the following components:      Result Value   Calcium  8.7 (*)    All other components within normal limits  TROPONIN T, HIGH SENSITIVITY - Abnormal; Notable for the following components:   Troponin T High  Sensitivity 43 (*)    All other components within normal limits  CBC  TROPONIN T, HIGH SENSITIVITY    EKG: EKG Interpretation Date/Time:  Tuesday May 15 2024 14:00:00 EST Ventricular Rate:  76 PR Interval:  172 QRS Duration:  66 QT Interval:  358 QTC Calculation: 402 R Axis:   8  Text Interpretation: Normal sinus rhythm Possible Left atrial enlargement Anterior infarct , age undetermined Abnormal ECG When compared with ECG of 13-May-2024 18:06, PREVIOUS ECG IS PRESENT Confirmed by Yolande Charleston [phone]) on 05/15/2024 2:29:52 PM  Radiology: DG Chest 2 View Result Date: 05/15/2024 CLINICAL DATA:  Chest pain EXAM: DG CHEST 2V COMPARISON:  Two days ago FINDINGS: Stable cardiomediastinal silhouette. Sternotomy wires are noted. Stent graft is noted in descending thoracic aorta. Stable right upper lobe mass. No acute pulmonary disease. IMPRESSION: Stable right upper lobe mass. No acute abnormality seen. Electronically Signed   By: Lynwood Landy Raddle M.D.   On: 05/15/2024 15:19   CT Angio Chest/Abd/Pel for Dissection W and/or Wo Contrast Result Date: 05/13/2024 EXAM: CTA CHEST, ABDOMEN AND PELVIS WITH AND WITHOUT CONTRAST 05/13/2024 09:27:37 PM TECHNIQUE: CTA of the chest was performed with and without the administration of intravenous contrast. CTA of the abdomen and pelvis was performed with and without the administration of intravenous contrast. 100 mL of iohexol  (OMNIPAQUE ) 350 MG/ML injection was administered. Multiplanar reformatted images are provided for review. MIP images are provided for review. Automated exposure control, iterative reconstruction, and/or weight based adjustment of the mA/kV was utilized to reduce the radiation dose to as low as reasonably achievable. COMPARISON: 05/09/2024 CLINICAL HISTORY: Acute aortic syndrome (AAS) suspected. FINDINGS: VASCULATURE: AORTA: Tube graft repair in the ascending thoracic aorta. Stent graft repair from the aortic arch through the descending  thoracic aorta. 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 Just below the stent graft and at the level of the renal arteries , there is a partially thrombosed false lumen from a chronic dissection. The false lumen has further thrombosed since the prior study from 04/19/2024. Below the renal arteries, the aorta is normal caliber with atherosclerotic calcifications. PULMONARY ARTERIES: No pulmonary embolism with the limits of this exam. GREAT VESSELS OF AORTIC ARCH: No acute finding. No dissection. No arterial occlusion or significant stenosis. CELIAC TRUNK: No acute finding. No occlusion or significant stenosis. SUPERIOR MESENTERIC ARTERY: No acute finding. No occlusion or significant stenosis. INFERIOR MESENTERIC ARTERY: No acute finding. No occlusion or significant stenosis. RENAL ARTERIES: No acute finding. No occlusion or significant stenosis.  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 No occlusion or significant stenosis. CHEST: MEDIASTINUM: Cardiomegaly. Right paratracheal lymph node measures up to 11 mm in short axis diameter, stable. The heart and pericardium demonstrate no acute abnormality. LUNGS AND PLEURA: Right upper lobe mass is again noted, measuring 3.5 cm, stable since prior study. Peripheral mass in the right upper lobe measures up to 2.7 cm in greatest diameter, also stable. Bronchial wall thickening and atelectasis in the lower lobes bilaterally. Centrilobular and paraseptal emphysema. No focal consolidation or pulmonary edema. No evidence of pleural effusion or pneumothorax. THORACIC BONES AND SOFT TISSUES: No acute bone or soft tissue abnormality. ABDOMEN AND PELVIS: LIVER: The liver is unremarkable. GALLBLADDER AND BILE DUCTS: The gallbladder is contracted. No biliary ductal dilatation. SPLEEN: The spleen is unremarkable. PANCREAS: The pancreas is unremarkable. ADRENAL GLANDS: Bilateral adrenal glands  demonstrate no acute abnormality. KIDNEYS, URETERS AND BLADDER: Small bilateral renal cysts appear benign. No stones in the kidneys or ureters. No hydronephrosis. No perinephric or periureteral stranding. Urinary bladder is unremarkable. GI AND BOWEL: Large left inguinal hernia containing multiple small bowel loops, unchanged. Stomach and duodenal sweep demonstrate no acute abnormality. There is no bowel obstruction. No abnormal bowel wall thickening or distension. REPRODUCTIVE: Reproductive organs are unremarkable. PERITONEUM AND RETROPERITONEUM: No ascites or free air. LYMPH NODES: No lymphadenopathy. ABDOMINAL BONES AND SOFT TISSUES: No acute abnormality of the bones. No acute soft tissue abnormality. IMPRESSION: 1. 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 2. Right upper lobe mass measuring 3.5 cm, stable. 2.7 cm peripheral right upper lobe mass, also stable. 3. 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 4. Large left inguinal hernia containing multiple small bowel loops, unchanged, without bowel obstruction. Electronically signed by: Franky Crease MD 05/13/2024 09:41 PM EST RP Workstation: [id number]   DG Chest 2 View Result Date: 05/13/2024 EXAM: 2 VIEW(S) XRAY OF THE CHEST 05/13/2024 06:18:00 PM COMPARISON: 05/08/2024 CLINICAL HISTORY: chest pain FINDINGS: LINES, TUBES AND DEVICES: Descending thoracic aortic stent is noted. LUNGS AND PLEURA: Persistent lateral right upper lobe lung mass is present. Slightly increased patchy airspace opacity is noted at the right lung base. No pleural effusion. No pneumothorax. HEART AND MEDIASTINUM: No acute abnormality of the cardiac and mediastinal silhouettes. BONES AND SOFT TISSUES: Post median sternotomy is noted. Intact sternotomy wires are  present. No acute osseous abnormality. IMPRESSION: 1. Slightly increased patchy airspace opacity worrisome for infection . 2. Persistent lateral right upper lobe lung mass. Electronically signed by: Greig Pique MD 05/13/2024 06:28 PM EST RP Workstation: [id number]     Procedures   Medications Ordered in the ED  HYDROmorphone  (DILAUDID ) injection 0.5 mg (has no administration in time range)  HYDROmorphone  (DILAUDID ) injection 0.5 mg (0.5 mg Intravenous Given 05/15/24 1456)  iohexol  (OMNIPAQUE ) 350 MG/ML injection 100 mL (100 mLs Intravenous Contrast Given 05/15/24 1535)    Clinical Course as of 05/15/24 1641  Tue May 15, 2024  1551 Dr Gita from radiology consulted.  [RP]  1606 Signed out to Dr Ginger [RP]    Clinical Course User Index [RP] Yolande Lamar BROCKS, MD                                 Medical Decision Making Amount and/or  Complexity of Data Reviewed Labs: ordered. Radiology: ordered.  Risk Prescription drug management.   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   Initial Ddx:  Chronic pain, dissection, limb ischemia, MI, PE, lung mass  MDM/Course:  Patient presents emergency department chest pain, back pain, and leg pain.  Does have a history of dissection.  Says like some of the symptoms are chronic.  Was seen 2 days ago and had a CTA that did not show evidence of worsening dissection.  Reports that his symptoms worsened after being discharged.  Thinks that his home pain medication was not strong enough.  On exam is not in acute distress.  Does have a pulse deficit which I suspect is chronic.  His hand and foot appear warm and well-perfused on the left side where his pulses are somewhat diminished.  Will obtain a CTA again to see if dissection has worsened at all.   Upon re-evaluation requiring additional dose of Dilaudid .  Did call radiology took a look at the scan and states that it does not appear that there are any significant changes to the dissection.  Signed out to the oncoming physician awaiting repeat troponin and formal CT read  This patient presents to the ED for concern of complaints listed in HPI, this involves an extensive number of treatment options, and is a complaint that carries with it a high risk of complications and morbidity. Disposition including potential need for admission considered.   Dispo: Pending remainder of workup  I have reviewed the patients home medications and made adjustments as needed Records reviewed Outpatient Clinic Notes The following labs were independently interpreted: Chemistry and show no acute abnormality I independently reviewed the following imaging with scope of interpretation limited to determining acute life threatening conditions related to emergency care: CT Abdomen/Pelvis and agree with the radiologist interpretation with the following exceptions: none I personally reviewed and interpreted cardiac monitoring: normal sinus rhythm  I personally reviewed and interpreted the pt's EKG: see above for interpretation  Social Determinants of health:  Geriatric  Portions of this note were generated with Scientist, clinical (histocompatibility and immunogenetics). Dictation errors may occur despite best attempts at proofreading.     Final diagnoses:  Chest pain, unspecified type    ED Discharge Orders     None          Yolande Lamar BROCKS, MD 05/15/24 1642  "

## 2024-05-15 NOTE — ED Provider Notes (Signed)
 Care assumed from Dr. Jakie.  At time of transfer of care, patient is awaiting for results of CT of the torso and reassessment to determine disposition.  The previous team did speak to radiology who felt that the dissection area appears similar to prior, dissipate discussion with vascular surgery to ensure appropriate outpatient follow-up plan and likely discharge with outpatient pain control medication.  9:09 PM CT scan returned without new dissection or aneurysmal changes.  Everything seems stable from prior.  Patient still has a lung mass and given the pain in the chest, suspect it may be related to the malignancy.  Patient was informed that I read all of the findings to the patient.  He would like to go home and we will give him a short prescription of his pain medicine that has helped.  He reports that the oxycodone  10 helped him in the past he ran out of them.  We told him that he is to follow-up with a primary doctor to discuss more ongoing management with this and he agrees.  He understood return precautions and follow-up instructions and was discharged in stable condition.  Clinical Impression: 1. Chest pain, unspecified type   2. Atypical chest pain   3. Lung mass     Disposition: Discharge  Condition: Good  I have discussed the results, Dx and Tx plan with the pt(& family if present). He/she/they expressed understanding and agree(s) with the plan. Discharge instructions discussed at great length. Strict return precautions discussed and pt &/or family have verbalized understanding of the instructions. No further questions at time of discharge.    Current Discharge Medication List     START taking these medications   Details  !! oxyCODONE  10 MG TABS Take 1 tablet (10 mg total) by mouth every 4 (four) hours as needed for severe pain (pain score 7-10). Qty: 10 tablet, Refills: 0     !! - Potential duplicate medications found. Please discuss with provider.      Follow  Up: [facility] [address] [place] [address] [place]  [address] [phone] Schedule an appointment as soon as possible for a visit    [facility] Emergency Department at [facility] [address] [place]  [address] [phone]       Nirvana Blanchett, Lonni PARAS, MD 05/15/24 2109

## 2024-05-15 NOTE — ED Provider Notes (Signed)
 Had labs 2 days ago that showed normal GFR.  Patient does not need to wait for labs before CT scan especially due to concerns for possible dissection   Victor Lamar BROCKS, MD 05/15/24 1416

## 2024-05-18 ENCOUNTER — Other Ambulatory Visit (HOSPITAL_COMMUNITY): Payer: Self-pay

## 2024-05-18 ENCOUNTER — Telehealth: Payer: Self-pay | Admitting: Internal Medicine

## 2024-05-18 ENCOUNTER — Telehealth: Payer: Self-pay | Admitting: Licensed Clinical Social Worker

## 2024-05-18 ENCOUNTER — Emergency Department (HOSPITAL_COMMUNITY)

## 2024-05-18 ENCOUNTER — Emergency Department (HOSPITAL_COMMUNITY)
Admission: EM | Admit: 2024-05-18 | Discharge: 2024-05-18 | Disposition: A | Discharge status: Home / Self Care | Source: Ambulatory Visit | Attending: Emergency Medicine | Admitting: Emergency Medicine

## 2024-05-18 ENCOUNTER — Encounter (HOSPITAL_COMMUNITY): Payer: Self-pay

## 2024-05-18 ENCOUNTER — Other Ambulatory Visit: Payer: Self-pay

## 2024-05-18 ENCOUNTER — Ambulatory Visit (HOSPITAL_COMMUNITY)
Admission: RE | Admit: 2024-05-18 | Discharge: 2024-05-18 | Disposition: A | Source: Ambulatory Visit | Attending: Nurse Practitioner | Admitting: Nurse Practitioner

## 2024-05-18 DIAGNOSIS — R0789 Other chest pain: Secondary | ICD-10-CM | POA: Insufficient documentation

## 2024-05-18 DIAGNOSIS — I2510 Atherosclerotic heart disease of native coronary artery without angina pectoris: Secondary | ICD-10-CM

## 2024-05-18 DIAGNOSIS — R079 Chest pain, unspecified: Secondary | ICD-10-CM

## 2024-05-18 DIAGNOSIS — I10 Essential (primary) hypertension: Secondary | ICD-10-CM | POA: Diagnosis not present

## 2024-05-18 DIAGNOSIS — R0989 Other specified symptoms and signs involving the circulatory and respiratory systems: Secondary | ICD-10-CM | POA: Insufficient documentation

## 2024-05-18 DIAGNOSIS — R7989 Other specified abnormal findings of blood chemistry: Secondary | ICD-10-CM | POA: Insufficient documentation

## 2024-05-18 DIAGNOSIS — M791 Myalgia, unspecified site: Secondary | ICD-10-CM | POA: Insufficient documentation

## 2024-05-18 DIAGNOSIS — Z7982 Long term (current) use of aspirin: Secondary | ICD-10-CM | POA: Insufficient documentation

## 2024-05-18 DIAGNOSIS — R072 Precordial pain: Secondary | ICD-10-CM | POA: Insufficient documentation

## 2024-05-18 DIAGNOSIS — Z8673 Personal history of transient ischemic attack (TIA), and cerebral infarction without residual deficits: Secondary | ICD-10-CM | POA: Insufficient documentation

## 2024-05-18 DIAGNOSIS — R0602 Shortness of breath: Secondary | ICD-10-CM

## 2024-05-18 LAB — CBC WITH DIFFERENTIAL/PLATELET
Abs Immature Granulocytes: 0.01 K/uL (ref 0.00–0.07)
Basophils Absolute: 0 K/uL (ref 0.0–0.1)
Basophils Relative: 1 %
Eosinophils Absolute: 0.2 K/uL (ref 0.0–0.5)
Eosinophils Relative: 3 %
HCT: 44.9 % (ref 39.0–52.0)
Hemoglobin: 14.4 g/dL (ref 13.0–17.0)
Immature Granulocytes: 0 %
Lymphocytes Relative: 19 %
Lymphs Abs: 1.2 K/uL (ref 0.7–4.0)
MCH: 28.4 pg (ref 26.0–34.0)
MCHC: 32.1 g/dL (ref 30.0–36.0)
MCV: 88.6 fL (ref 80.0–100.0)
Monocytes Absolute: 0.4 K/uL (ref 0.1–1.0)
Monocytes Relative: 7 %
Neutro Abs: 4.5 K/uL (ref 1.7–7.7)
Neutrophils Relative %: 70 %
Platelets: 354 K/uL (ref 150–400)
RBC: 5.07 MIL/uL (ref 4.22–5.81)
RDW: 13.7 % (ref 11.5–15.5)
WBC: 6.4 K/uL (ref 4.0–10.5)
nRBC: 0 % (ref 0.0–0.2)

## 2024-05-18 LAB — BASIC METABOLIC PANEL WITH GFR
Anion gap: 9 (ref 5–15)
BUN: 13 mg/dL (ref 8–23)
CO2: 27 mmol/L (ref 22–32)
Calcium: 9.4 mg/dL (ref 8.9–10.3)
Chloride: 104 mmol/L (ref 98–111)
Creatinine, Ser: 0.97 mg/dL (ref 0.61–1.24)
GFR, Estimated: >60 mL/min
Glucose, Bld: 84 mg/dL (ref 70–99)
Potassium: 4.6 mmol/L (ref 3.5–5.1)
Sodium: 140 mmol/L (ref 135–145)

## 2024-05-18 LAB — TROPONIN T, HIGH SENSITIVITY: Troponin T High Sensitivity: 51 ng/L — ABNORMAL HIGH (ref 0–19)

## 2024-05-18 MED ORDER — OXYCODONE HCL 5 MG PO TABS: 10.0000 mg | ORAL_TABLET | Freq: Four times a day (QID) | ORAL | 0 refills | Status: AC | PRN

## 2024-05-18 MED ORDER — TIZANIDINE HCL 4 MG PO TABS: 4.0000 mg | ORAL_TABLET | Freq: Four times a day (QID) | ORAL | 0 refills | Status: DC | PRN

## 2024-05-18 MED ORDER — TIZANIDINE HCL 4 MG PO TABS
4.0000 mg | ORAL_TABLET | Freq: Once | ORAL | Status: AC
  Administered 2024-05-18: 4 mg via ORAL
  Filled 2024-05-18: qty 1

## 2024-05-18 MED ORDER — OXYCODONE HCL 5 MG PO TABS
5.0000 mg | ORAL_TABLET | Freq: Once | ORAL | Status: AC
  Administered 2024-05-18: 5 mg via ORAL
  Filled 2024-05-18: qty 1

## 2024-05-18 MED ORDER — OXYCODONE HCL 5 MG PO TABS: 10.0000 mg | ORAL_TABLET | Freq: Four times a day (QID) | ORAL | 0 refills | Status: DC | PRN

## 2024-05-18 NOTE — ED Triage Notes (Addendum)
 Patient BIBA coming from home c/o back, chest pain, leg pain. BP 130/88 Hr 74 98% RA CBG 93. Here last week for same problem where CT scan showed mass to chest. Patient is alert and oriented x 4. Airway patent, respirations even and unlabored. Skin normal, warm and dry.

## 2024-05-18 NOTE — Telephone Encounter (Signed)
 Spoke with pt Daughter, pt is confirmed to be picked up by [facility] at 2:15 for 3 pm Caroid Test.  Est cost $23 one way per [facility] bird.  Pt needs to sign waiver in office. Voucher were faxed to [facility] 8:53 am 1/23 today.   05/18/2024  Victor [name] DOB: [birth year] MRN: [id number]   RIDER WAIVER AND RELEASE OF LIABILITY  For the purposes of helping with transportation needs, [facility] partners with outside transportation providers (taxi companies, [place], catering manager.) to give [facility] patients or other approved people the choice of on-demand rides Public Librarian) to our buildings for non-emergency visits.  By using [employer], I, the person signing this document, on behalf of myself and/or any legal minors (in my care using the [employer]), agree:  Science Writer given to me are supplied by independent, outside transportation providers who do not work for, or have any affiliation with, [employer]. [place] is not a transportation company. [facility] has no control over the quality or safety of the rides I get using [employer]. [place] has no control over whether any outside ride will happen on time or not. [facility] gives no guarantee on the reliability, quality, safety, or availability on any rides, or that no mistakes will happen. I know and accept that traveling by vehicle (car, truck, SVU, fleeta, bus, taxi, etc.) has risks of serious injuries such as disability, being paralyzed, and death. I know and agree the risk of using [employer] is mine alone, and not [employer]. [employer] are provided as is and as are available. The transportation providers are in charge for all inspections and care of the vehicles used to provide these rides. I agree not to take legal action against [facility], its agents, employees, officers, directors, representatives, insurers, attorneys, assigns, successors, subsidiaries, and affiliates at any time for any  reasons related directly or indirectly to using [employer]. I also agree not to take legal action against [place] or its affiliates for any injury, death, or damage to property caused by or related to using [employer]. I have read this Waiver and Release of Liability, and I understand the terms used in it and their legal meaning. This Waiver is freely and voluntarily given with the understanding that my right (or any legal minors) to legal action against [place] relating to [employer] is knowingly given up to use these services.   I attest that I read the Ride Waiver and Release of Liability to Victor [name], gave Mr. Roedl the opportunity to ask questions and answered the questions asked (if any). I affirm that Victor [name] then provided consent for assistance with transportation.     [name]

## 2024-05-18 NOTE — Discharge Instructions (Signed)
 Victor [name]  Thank you for allowing us  to take care of you today.  You came to the Emergency Department today because you continue to have the same pain in your chest as you did on your previous evaluations in the emergency department.  Here your chest x-ray, labs, heart number were unchanged.  Again your pain is most likely due to the cancer in your right lung.  We will refill your pain medication for 3 days, however it is important that you follow-up with your oncologist for any further refills.   To-Do: 1. Please follow-up with your primary doctor within 1 - 2 weeks / as soon as possible.   Please return to the Emergency Department or call 911 if you experience have worsening of your symptoms, or do not get better,, new or different chest pain, shortness of breath, severe or significantly worsening pain, high fever, severe confusion, pass out or have any reason to think that you need emergency medical care.   We hope you feel better soon.   Mitzie Later, MD Department of Emergency Medicine [facility]

## 2024-05-18 NOTE — Telephone Encounter (Signed)
 H&V Care Navigation CSW Progress Note  Clinical Social Worker met with patient to address concerns. Pt had completed appt with vascular imaging, shared with volunteer that he's having generalized pain in his back, leg, chest. Policy is to contact 911 for pt concerns, they were contacted and EMS is to respond to the building to assess pt concerns. Pt had a cab scheduled to bring him to appt, reached out to vascular imaging team to see if ride home had been scheduled, made them aware of EMS call.   Patient is participating in a Managed Medicaid Plan:  No, [employer] Medicare  SDOH Screenings   Food Insecurity: No Food Insecurity (05/08/2024)  Housing: Low Risk (05/08/2024)  Transportation Needs: Unmet Transportation Needs (05/18/2024)  Utilities: Not At Risk (05/08/2024)  Depression (PHQ2-9): High Risk (04/27/2024)  Social Connections: Unknown (05/08/2024)  Tobacco Use: Medium Risk (05/15/2024)    Marit Lark, MSW, LCSW Clinical Social Worker II [facility] Care Navigation  [phone]- work cell phone (preferred)

## 2024-05-18 NOTE — ED Provider Notes (Signed)
 " [place] EMERGENCY DEPARTMENT AT [facility] Provider Note   CSN: [id number] Arrival date & time: 05/18/24  1627     History Chief Complaint  Patient presents with   Leg Pain   Back Pain   Chest Pain    HPI: Victor [name] is a 69 y.o. 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 Patient arrived via EMS from home.  History provided by patient.  No interpreter required during this encounter.  Patient presents for several complaints.  Reports that he has ongoing chest pain, states that pain occasionally radiates to his back.  Reports that he also has generalized muscle aches, particularly in his bilateral lower extremities.  States that the symptoms have been ongoing for approximately 2-1/2 months.  Reports that his symptoms are unchanged from when I saw him 5 days ago for the same complaints.  Denies any fevers, chills, shortness of breath, nausea, vomiting, diarrhea.  Denies any new or different components to his symptoms.  Patient's recorded medical, surgical, social, medication list and allergies were reviewed in the Snapshot window as part of the initial history.   Prior to Admission medications  Medication Sig Start Date End Date Taking? Authorizing Provider  aspirin  325 MG tablet Take 0.5 tablets (162.5 mg total) by mouth daily. Patient not taking: Reported on 05/11/2024 09/06/18   Whitfield Raisin, NP  gabapentin  (NEURONTIN ) 300 MG capsule Take 2 capsules (600 mg total) by mouth 3 (three) times daily. Patient not taking: Reported on 05/11/2024 10/17/23   Sherrine Birmingham, DO  lidocaine  (LIDODERM ) 5 % Place 2 patches onto the skin daily. Remove & Discard patch within 12 hours or as directed by MD Patient not taking: Reported on 05/11/2024 05/10/24   Gherghe, Costin M, MD  metoprolol  tartrate (LOPRESSOR ) 50 MG tablet Take 1 tablet (50 mg  total) by mouth once. Take 90-120 minutes prior to scan. Hold for SBP less than 110. 05/11/24 05/15/24  Monge, Damien BROCKS, NP  oxyCODONE  10 MG TABS Take 1 tablet (10 mg total) by mouth every 4 (four) hours as needed for severe pain (pain score 7-10). 05/15/24   Tegeler, Lonni PARAS, MD  Oxycodone  HCl 10 MG TABS Take 1 tablet (10 mg total) by mouth every 6 (six) hours as needed. Patient not taking: Reported on 05/11/2024 05/09/24   Gherghe, Costin M, MD  tiZANidine  (ZANAFLEX ) 4 MG tablet Take 1 tablet (4 mg total) by mouth every 6 (six) hours as needed for muscle spasms. 05/13/24   Rogelia Jerilynn RAMAN, MD  tiZANidine  (ZANAFLEX ) 4 MG tablet Take 1 tablet (4 mg total) by mouth every 6 (six) hours as needed for up to 5 days for muscle spasms. 05/18/24 05/23/24  Rogelia Jerilynn RAMAN, MD     Allergies: Patient has no known allergies.   Review of Systems   ROS as per HPI  Physical Exam Updated Vital Signs BP 103/76 (BP Location: Left Arm)   Pulse 80   Temp 98 F (36.7 C) (Oral)   Resp 16   SpO2 98%  Physical Exam Vitals and nursing note reviewed.  Constitutional:      General: He is not in acute distress.    Appearance: He is well-developed.  HENT:     Head: Normocephalic and atraumatic.  Eyes:     Conjunctiva/sclera: Conjunctivae normal.  Cardiovascular:     Rate and Rhythm: Normal rate and  regular rhythm.     Heart sounds: No murmur heard. Pulmonary:     Effort: Pulmonary effort is normal. No respiratory distress.     Breath sounds: Normal breath sounds.  Chest:     Chest wall: Tenderness (Mild right sided chest wall tenderness.) present.  Abdominal:     Palpations: Abdomen is soft.     Tenderness: There is no abdominal tenderness.  Musculoskeletal:        General: No swelling.     Cervical back: Neck supple.  Skin:    General: Skin is warm and dry.     Capillary Refill: Capillary refill takes less than 2 seconds.  Neurological:     Mental Status: He is alert.  Psychiatric:         Mood and Affect: Mood normal.     ED Course/ Medical Decision Making/ A&P    Procedures Procedures   Medications Ordered in ED Medications  oxyCODONE  (Oxy IR/ROXICODONE ) immediate release tablet 5 mg (5 mg Oral Given 05/18/24 1955)  tiZANidine  (ZANAFLEX ) tablet 4 mg (4 mg Oral Given by Other 05/18/24 1957)    Medical Decision Making:   Victor [name] is a 69 y.o. male who presents for multiple complaints as per above.  Physical exam is pertinent for right sided chest wall tenderness.   The differential includes but is not limited to ACS, aortic dissection, metabolic derangement, cancer related pain.  Independent historian: None  External data reviewed: Labs: reviewed prior labs for baseline and Notes: Reviewed prior ED note including Mond from patient's prior ED visit, as well as interval emergency department presentation where patient had similar complaints.  Initial Plan:  Screening labs including CBC and Metabolic panel to evaluate for infectious or metabolic etiology of disease.  Chest x-ray to evaluate for structural/infectious intra-thoracic pathology.  EKG and single troponin to evaluate for cardiac pathology. Single troponin appropriate due to greater than 6 hours since maximal intensity of symptoms. Objective evaluation as below reviewed   Labs: Ordered, Independent interpretation, and Details: CBC without leukocytosis, anemia, thrombocytopenia.  BMP without AKI, electrolyte derangement.  Troponin elevated at 51, which is at baseline for patient.  Radiology: Ordered, Independent interpretation, Details: Chest x-ray without focal airspace opacification, cardiac silhouette derangement, similar appearance of aortic graft on comparison to prior, no pneumothorax, pleural effusion, bony derangement., and All images reviewed independently.  Agree with radiology report at this time.   VAS US  CAROTID Result Date: 05/19/2024 Carotid Arterial Duplex Study Patient Name:  Victor [name]  Date of Exam:   05/18/2024 Medical Rec #: [id number]        Accession #:    [id number] Date of Birth: February 23, 1956        Patient Gender: M Patient Age:   69 years Exam Location:  [address] Procedure:      VAS US  CAROTID Referring Phys: EMILY MONGE --------------------------------------------------------------------------------  Indications:       Precordial pain [R07.2 (ICD-10-CM)]; Right carotid bruit                    [R09.89 (ICD-10-CM)]; SOB (shortness of breath) [R06.02                    (ICD-10-CM)]; Coronary artery calcification [I25.10                    (ICD-10-CM)]. Risk Factors:      Hypertension, hyperlipidemia, past history of smoking,  coronary artery disease. Comparison Study:  12/31/2016 Carotid artery duplex Performing Technologist: Rosaline Fujisawa MHA, RDMS, RVT, RDCS  Examination Guidelines: A complete evaluation includes B-mode imaging, spectral Doppler, color Doppler, and power Doppler as needed of all accessible portions of each vessel. Bilateral testing is considered an integral part of a complete examination. Limited examinations for reoccurring indications may be performed as noted.  Right Carotid Findings: +----------+--------+--------+--------+------------------+------------------+           PSV cm/sEDV cm/sStenosisPlaque DescriptionComments           +----------+--------+--------+--------+------------------+------------------+ CCA Prox  137     7                                                    +----------+--------+--------+--------+------------------+------------------+ CCA Distal89      7                                                    +----------+--------+--------+--------+------------------+------------------+ ICA Prox  26      4                                                    +----------+--------+--------+--------+------------------+------------------+ ICA Mid   48      7                                 intimal  thickening +----------+--------+--------+--------+------------------+------------------+ ICA Distal59      8                                                    +----------+--------+--------+--------+------------------+------------------+ ECA       78      5                                 intimal thickening +----------+--------+--------+--------+------------------+------------------+ +----------+--------+-------+--------+-------------------+           PSV cm/sEDV cmsDescribeArm Pressure (mmHG) +----------+--------+-------+--------+-------------------+ Subclavian69             Atypical158                 +----------+--------+-------+--------+-------------------+ +---------+--------+--------+----------------------------+ VertebralPSV cm/sEDV cm/sStenotic, diastolic reversal +---------+--------+--------+----------------------------+  Left Carotid Findings: +----------+--------+--------+--------+------------------+--------+           PSV cm/sEDV cm/sStenosisPlaque DescriptionComments +----------+--------+--------+--------+------------------+--------+ CCA Prox  165     3                                          +----------+--------+--------+--------+------------------+--------+ CCA Distal102     6                                          +----------+--------+--------+--------+------------------+--------+  ICA Prox  53      7                                          +----------+--------+--------+--------+------------------+--------+ ICA Mid   72      14                                         +----------+--------+--------+--------+------------------+--------+ ICA Distal76      14                                         +----------+--------+--------+--------+------------------+--------+ ECA       102     5                                          +----------+--------+--------+--------+------------------+--------+  +----------+--------+--------+-------------+-------------------+           PSV cm/sEDV cm/sDescribe     Arm Pressure (mmHG) +----------+--------+--------+-------------+-------------------+ Dlarojcpjw62              Bidirectional129                 +----------+--------+--------+-------------+-------------------+ +---------+--------+--+--------+----------+ VertebralPSV cm/s74EDV cm/sRetrograde +---------+--------+--+--------+----------+   Summary: Right Carotid: The extracranial vessels were near-normal with only minimal wall                thickening or plaque. Left Carotid: The extracranial vessels were near-normal with only minimal wall               thickening or plaque. Vertebrals:  Right vertebral artery is stenotic with diastolic reversal. Left              vertebral artery exhibits retrograde flow. Subclavians: Right subclavian artery exhibits atypical flow. The left subclavian              artery exhibits bidirectional flow. *See table(s) above for measurements and observations.  Electronically signed by Timothy Gollan MD on 05/19/2024 at 7:25:35 PM.    Final    DG Chest 2 View Result Date: 05/18/2024 EXAM: 2 VIEW(S) XRAY OF THE CHEST 05/18/2024 07:32:48 PM COMPARISON: 05/15/2024 CLINICAL HISTORY: Chest pain. Back, chest, and leg pain. FINDINGS: LUNGS AND PLEURA: 4.6 cm mass lesion in the right upper lobe laterally, unchanged since prior study. Focal pleural thickening along the right lateral chest is also unchanged. There is linear atelectasis in the right lung base. No developing consolidation. No pleural effusion or pneumothorax. HEART AND MEDIASTINUM: Postoperative changes in the mediastinum with sternotomy wires present. Descending thoracic aortic graft. Heart size and pulmonary vascularity are normal. BONES AND SOFT TISSUES: No acute osseous abnormality. IMPRESSION: 1. No acute cardiopulmonary abnormality, with no developing consolidation, pleural effusion, or pneumothorax. 2.  Postoperative changes with descending thoracic aortic graft and sternotomy wires. 3. 4.6 cm right upper lobe mass laterally, unchanged since prior study. 4. Focal pleural thickening along the right lateral chest, unchanged. 5. Linear atelectasis in the right lung base. Electronically signed by: Elsie Gravely MD 05/18/2024 07:37 PM EST RP Workstation: [id number]   CT ANGIO CHEST/ABD/PEL FOR DISSECTION W &/OR WO CONTRAST Result Date: 05/15/2024 CLINICAL DATA:  Chest leg and back pain history of dissection EXAM: CT ANGIOGRAPHY CHEST, ABDOMEN AND PELVIS TECHNIQUE: Non-contrast CT of the chest was initially obtained. Multidetector CT imaging through the chest, abdomen and pelvis was performed using the standard protocol during bolus administration of intravenous contrast. Multiplanar reconstructed images and MIPs were obtained and reviewed to evaluate the vascular anatomy. RADIATION DOSE REDUCTION: This exam was performed according to the departmental dose-optimization program which includes automated exposure control, adjustment of the mA and/or kV according to patient size and/or use of iterative reconstruction technique. CONTRAST:  OMNIPAQUE  IOHEXOL  350 MG/ML SOLN COMPARISON:  Chest x-ray 05/15/2024, CT 05/13/2024, 05/09/2024, 04/19/2024, 03/07/2017, PET CT 05/07/2024 FINDINGS: CTA CHEST FINDINGS Cardiovascular: Non contrasted images of the chest demonstrate no acute intramural hematoma. Patient is status post graft repair of the ascending aorta with stable configuration compared to recent priors. Right brachiocephalic and left common carotid arteries are grossly patent. 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 Extensive stent graft repair of the aortic arch and descending thoracic aorta with graft patency. 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 Stable borderline cardiomegaly.  No sizable pericardial effusion Mediastinum/Nodes: Patent trachea. Multiple thyroid nodules, largest in the right lobe measures 1.7 cm. Mildly enlarged right paratracheal nodes measuring up to 15 mm. Esophagus is within normal limits. Lungs/Pleura: Emphysema. Right apical lung mass measuring up to 3.5 cm and PET positive, concerning for malignancy. Right upper lobe pleural nodule measuring 2.8 cm on series 7, image 79, also hypermetabolic on recent PET imaging and concerning for metastatic disease, series 7, image 79. Scarring and or atelectasis in the lower lobes. Bandlike scarring or atelectasis in the right upper lobe. Musculoskeletal: No acute osseous abnormality. Sternotomy. Mild chronic superior endplate deformity at T11 and T12. Review of the MIP images confirms the above findings. 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 Partial thrombosis of the false lumen as before. Dissection extends to the infrarenal abdominal aorta, maximal luminal diameter of 3.7 cm. Normal distal aorta proximal to the iliac bifurcation. Celiac: Patent and without dissection, aneurysm or occlusion. Some narrowing at the origin. SMA: Patent and without dissection, aneurysm or occlusion Renals: 2 left and single right renal arteries are grossly patent, left renal arteries arise from true lumen, right renal artery arises from the false lumen IMA: Patent and without aneurysm, dissection or occlusion Inflow: Atherosclerosis. No aneurysm or dissection. There appears to be a chronically occluded femoral to femoral graft. Aneurysmal dilatation of the right greater than left common femoral arteries,  measuring 2.4 cm on the right and 2.3 cm on the left with partial thrombus of the lumen on the left. Veins: Suboptimally evaluated Review of the MIP images confirms the above findings. NON-VASCULAR Hepatobiliary: No focal hepatic abnormality. No calcified gallstone or biliary dilatation Pancreas: No inflammation or ductal dilatation Spleen: Normal in size without focal abnormality. Adrenals/Urinary Tract: Adrenal glands are normal. Kidneys show no hydronephrosis. Mild atrophy and scarring of right kidney. Chronic hypodense lesion in the right kidney, no imaging follow-up recommended. The bladder is unremarkable Stomach/Bowel: The stomach is within normal limits. No dilated small bowel. No acute bowel  wall thickening. Lymphatic: No suspicious lymph nodes Reproductive: No suspicious mass Other: No ascites or free air. Large left inguinal hernia containing mesenteric fat and small bowel, extending into the scrotum, no obstruction or features for incarceration Musculoskeletal: Chronic superior endplate deformity at L1 Review of the MIP images confirms the above findings. IMPRESSION: 1. Stable appearance of the thoracic aorta status post graft repair of the ascending aorta and stent graft repair of the aortic arch and descending thoracic aorta. No evidence for perigraft soft tissue stranding or mediastinal hematoma to suggest leakage. 2. 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 This is stable compared with recent priors, increased compared to more remote exam from 2018. 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 3. 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 Right apical lung mass measuring up to 3.5 cm and PET positive, concerning for malignancy. Right upper lobe lateral subpleural/pleural nodule measuring 2.8 cm, also hypermetabolic on recent PET imaging and concerning for metastatic disease. 5. Large left inguinal hernia containing mesenteric fat and small bowel, extending into the scrotum, no obstruction or features for incarceration. 6. Multiple thyroid nodules, largest in the right lobe measures 1.7 cm. Recommend nonemergent thyroid US  if not already performed (ref: J Am Coll Radiol. 2015 Feb;12(2): 143-50). Aortic Atherosclerosis (ICD10-I70.0) and Emphysema (ICD10-J43.9). Electronically Signed   By: Luke Bun M.D.   On: 05/15/2024 16:41   DG Chest 2 View Result Date: 05/15/2024 CLINICAL DATA:  Chest pain EXAM: DG CHEST 2V COMPARISON:  Two days ago FINDINGS: Stable cardiomediastinal silhouette. Sternotomy wires are noted. Stent graft is noted in descending thoracic aorta. Stable right upper lobe mass. No acute pulmonary disease. IMPRESSION: Stable right upper lobe mass. No acute abnormality seen. Electronically Signed   By: Lynwood Landy Raddle M.D.   On: 05/15/2024 15:19   CT Angio Chest/Abd/Pel for Dissection W and/or Wo Contrast Result Date: 05/13/2024 EXAM: CTA CHEST, ABDOMEN AND PELVIS WITH AND WITHOUT CONTRAST 05/13/2024 09:27:37 PM TECHNIQUE: CTA of the chest was performed with and without the administration of intravenous contrast. CTA of the abdomen and pelvis was performed with and without the administration of intravenous contrast. 100 mL of iohexol  (OMNIPAQUE ) 350 MG/ML injection was administered. Multiplanar reformatted images are provided for review. MIP images are provided for review. Automated exposure control, iterative reconstruction, and/or weight based adjustment of the mA/kV was utilized to reduce the radiation dose to as low as reasonably achievable. COMPARISON: 05/09/2024 CLINICAL HISTORY: Acute aortic syndrome (AAS)  suspected. FINDINGS: VASCULATURE: AORTA: Tube graft repair in the ascending thoracic aorta. Stent graft repair from the aortic arch through the descending thoracic aorta. 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 Just below the stent graft and at the level of the renal arteries , there is a partially thrombosed false lumen from a chronic dissection. The false lumen has further thrombosed since the prior study from 04/19/2024. Below the renal arteries, the aorta is normal caliber with atherosclerotic calcifications. PULMONARY ARTERIES: No pulmonary embolism with the limits of this exam. GREAT VESSELS OF AORTIC ARCH: No acute finding. No dissection. No arterial occlusion or significant stenosis. CELIAC TRUNK:  No acute finding. No occlusion or significant stenosis. SUPERIOR MESENTERIC ARTERY: No acute finding. No occlusion or significant stenosis. INFERIOR MESENTERIC ARTERY: No acute finding. No occlusion or significant stenosis. RENAL ARTERIES: No acute finding. No occlusion or significant stenosis. 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 No occlusion or significant stenosis. CHEST: MEDIASTINUM: Cardiomegaly. Right paratracheal lymph node measures up to 11 mm in short axis diameter, stable. The heart and pericardium demonstrate no acute abnormality. LUNGS AND PLEURA: Right upper lobe mass is again noted, measuring 3.5 cm, stable since prior study. Peripheral mass in the right upper lobe measures up to 2.7 cm in greatest diameter, also stable. Bronchial wall thickening and atelectasis in the lower lobes bilaterally. Centrilobular and paraseptal emphysema. No focal consolidation or pulmonary edema. No evidence of pleural effusion or pneumothorax. THORACIC BONES AND SOFT TISSUES: No acute bone or soft tissue abnormality. ABDOMEN AND PELVIS: LIVER: The liver is unremarkable. GALLBLADDER AND BILE DUCTS: The gallbladder is  contracted. No biliary ductal dilatation. SPLEEN: The spleen is unremarkable. PANCREAS: The pancreas is unremarkable. ADRENAL GLANDS: Bilateral adrenal glands demonstrate no acute abnormality. KIDNEYS, URETERS AND BLADDER: Small bilateral renal cysts appear benign. No stones in the kidneys or ureters. No hydronephrosis. No perinephric or periureteral stranding. Urinary bladder is unremarkable. GI AND BOWEL: Large left inguinal hernia containing multiple small bowel loops, unchanged. Stomach and duodenal sweep demonstrate no acute abnormality. There is no bowel obstruction. No abnormal bowel wall thickening or distension. REPRODUCTIVE: Reproductive organs are unremarkable. PERITONEUM AND RETROPERITONEUM: No ascites or free air. LYMPH NODES: No lymphadenopathy. ABDOMINAL BONES AND SOFT TISSUES: No acute abnormality of the bones. No acute soft tissue abnormality. IMPRESSION: 1. 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 2. Right upper lobe mass measuring 3.5 cm, stable. 2.7 cm peripheral right upper lobe mass, also stable. 3. 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 4. Large left inguinal hernia containing multiple small bowel loops, unchanged, without bowel obstruction. Electronically signed by: Franky Crease MD 05/13/2024 09:41 PM EST RP Workstation: [id number]   DG Chest 2 View Result Date: 05/13/2024 EXAM: 2 VIEW(S) XRAY OF THE CHEST 05/13/2024 06:18:00 PM COMPARISON: 05/08/2024 CLINICAL HISTORY: chest pain FINDINGS: LINES, TUBES AND DEVICES: Descending thoracic aortic stent is noted. LUNGS AND PLEURA: Persistent lateral right upper lobe lung mass is present. Slightly increased patchy airspace opacity is noted at the right lung base. No pleural effusion. No pneumothorax. HEART AND  MEDIASTINUM: No acute abnormality of the cardiac and mediastinal silhouettes. BONES AND SOFT TISSUES: Post median sternotomy is noted. Intact sternotomy wires are present. No acute osseous abnormality. IMPRESSION: 1. Slightly increased patchy airspace opacity worrisome for infection . 2. Persistent lateral right upper lobe lung mass. Electronically signed by: Greig Pique MD 05/13/2024 06:28 PM EST RP Workstation: [id number]   CT Super D Chest Wo Contrast Result Date: 05/09/2024 CLINICAL DATA:  Lung mass EXAM: CT CHEST WITHOUT CONTRAST TECHNIQUE: Multidetector CT imaging of the chest was performed using thin slice collimation for electromagnetic bronchoscopy planning purposes, without intravenous contrast. RADIATION DOSE REDUCTION: This exam was performed according to the departmental dose-optimization program which includes automated exposure control, adjustment of the mA and/or kV according to patient size and/or use of iterative reconstruction technique. COMPARISON:  PET CT 05/07/2024, chest CT 04/19/2024 FINDINGS: Cardiovascular: Limited assessment without intravenous contrast.  Coronary vascular calcification. Postsurgical changes of the aortic root in addition to stent graft extending from the arch to the proximal abdominal aorta. Normal cardiac size. No pericardial effusion. 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 Mediastinum/Nodes: Patent trachea. No suspicious thyroid mass. Right paratracheal nodes measuring up to 11 mm. Esophagus within normal limits Lungs/Pleura: Emphysema. Spiculated mass in the right upper lobe measuring 3.5 by 3.2 cm on series 5, image 47 and concerning for bronchogenic malignancy. Mass abuts the pleural surface. Additional pleural mass along the right upper lobe laterally, this measures 2.6 x 1 cm on series 5, image 80. bandlike atelectasis or scarring in the right upper lobe, inferior to the suspicious  pulmonary mass. Bilateral bronchial wall thickening with bandlike atelectasis or scarring in the right middle lobe. Streaky bilateral lower lobe densities and mild subpleural consolidations, slightly progressive compared with PET CT from 01/12. No pleural effusion or pneumothorax. Upper Abdomen: No acute finding Musculoskeletal: No acute osseous abnormality. Sternotomy. Mild superior endplate deformity at T11, likely chronic IMPRESSION: 1. Spiculated mass in the right upper lobe measuring up to 3.5 cm, concerning for bronchogenic malignancy. Additional pleural/subpleural mass along the right upper lobe laterally measuring up to 2.6 cm, hypermetabolic on prior PET CT and concerning for metastatic focus. 2. Bilateral bronchial wall thickening and emphysema with streaky bilateral lower lobe densities and mild subpleural consolidations, slightly progressive compared with PET CT from 01/12. Findings could be due to atelectasis though mild pneumonia or aspiration also possible 3. Postsurgical changes of the aortic root in addition to stent graft extending from the arch to the proximal abdominal aorta. 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 Aortic Atherosclerosis (ICD10-I70.0) and Emphysema (ICD10-J43.9). Electronically Signed   By: Luke Bun M.D.   On: 05/09/2024 15:49   MR ABDOMEN W WO CONTRAST Result Date: 05/08/2024 CLINICAL DATA:  Hypermetabolic right upper lobe lung mass EXAM: MRI ABDOMEN WITHOUT AND WITH CONTRAST TECHNIQUE: Multiplanar multisequence MR imaging of the abdomen was performed both before and after the administration of intravenous contrast. Heavily T2-weighted images of the biliary and pancreatic ducts were obtained. Post-processing was applied at the acquisition scanner with concurrent physician supervision which includes 3D reconstructions, MIPs, volume rendered images and/or shaded surface rendering.  CONTRAST:  6.5mL GADAVIST  GADOBUTROL  1 MMOL/ML IV SOLN COMPARISON:  Nuclear medicine PET dated 05/07/2024, CTA abdomen and pelvis dated 04/19/2024 FINDINGS: Lower chest: Partially imaged enhancing right lateral pleural-based mass measures 2.6 x 1.7 cm (21:30). Hepatobiliary: No mass or other parenchymal abnormality identified. No bile duct dilation. Contracted gallbladder containing cholelithiasis. Pancreas: No mass, inflammatory changes, or other parenchymal abnormality identified. Spleen:  Within normal limits in size and appearance. Adrenals/Urinary Tract: No adrenal nodules. No suspicious renal masses identified. No evidence of hydronephrosis. Hemorrhagic/proteinaceous cyst in the interpolar right kidney measures 2.1 x 1.9 cm (11:46). No specific follow-up imaging recommended. Additional nonenhancing left renal cysts. Stomach/Bowel: Visualized portions within the abdomen are unremarkable. 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 Other: 1.1 x 0.7 cm triangular enhancing focus within the right retroperitoneal region (15:46) demonstrates diffusion restriction (6:22, 7:22) and is not well appreciated on prior examinations. Musculoskeletal: No suspicious bone lesions identified. Median sternotomy wires are nondisplaced. IMPRESSION: 1. No focal hepatic or pancreatic lesions. 2. Partially imaged enhancing right lateral pleural-based mass measures  2.6 x 1.7 cm. 3. A 1.1 x 0.7 cm triangular enhancing focus within the right retroperitoneal region demonstrates diffusion restriction and is not well appreciated on prior examinations. This finding is indeterminate but suspicious for metastatic disease. Recommend attention on follow-up. 4. 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 Electronically Signed   By: Limin  Xu M.D.   On: 05/08/2024 19:12   MR 3D Recon At Scanner Result  Date: 05/08/2024 CLINICAL DATA:  Hypermetabolic right upper lobe lung mass EXAM: MRI ABDOMEN WITHOUT AND WITH CONTRAST TECHNIQUE: Multiplanar multisequence MR imaging of the abdomen was performed both before and after the administration of intravenous contrast. Heavily T2-weighted images of the biliary and pancreatic ducts were obtained. Post-processing was applied at the acquisition scanner with concurrent physician supervision which includes 3D reconstructions, MIPs, volume rendered images and/or shaded surface rendering. CONTRAST:  6.5mL GADAVIST  GADOBUTROL  1 MMOL/ML IV SOLN COMPARISON:  Nuclear medicine PET dated 05/07/2024, CTA abdomen and pelvis dated 04/19/2024 FINDINGS: Lower chest: Partially imaged enhancing right lateral pleural-based mass measures 2.6 x 1.7 cm (21:30). Hepatobiliary: No mass or other parenchymal abnormality identified. No bile duct dilation. Contracted gallbladder containing cholelithiasis. Pancreas: No mass, inflammatory changes, or other parenchymal abnormality identified. Spleen:  Within normal limits in size and appearance. Adrenals/Urinary Tract: No adrenal nodules. No suspicious renal masses identified. No evidence of hydronephrosis. Hemorrhagic/proteinaceous cyst in the interpolar right kidney measures 2.1 x 1.9 cm (11:46). No specific follow-up imaging recommended. Additional nonenhancing left renal cysts. Stomach/Bowel: Visualized portions within the abdomen are unremarkable. 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 Other: 1.1 x 0.7 cm triangular enhancing focus within the right retroperitoneal region (15:46) demonstrates diffusion restriction (6:22, 7:22) and is not well appreciated on prior examinations. Musculoskeletal: No suspicious bone lesions identified. Median sternotomy wires are nondisplaced. IMPRESSION: 1. No focal hepatic or pancreatic lesions. 2. Partially imaged enhancing right  lateral pleural-based mass measures 2.6 x 1.7 cm. 3. A 1.1 x 0.7 cm triangular enhancing focus within the right retroperitoneal region demonstrates diffusion restriction and is not well appreciated on prior examinations. This finding is indeterminate but suspicious for metastatic disease. Recommend attention on follow-up. 4. 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 Electronically Signed   By: Limin  Xu M.D.   On: 05/08/2024 19:12   DG Chest Portable 1 View Result Date: 05/08/2024 CLINICAL DATA:  Chest pain. EXAM: PORTABLE CHEST 1 VIEW COMPARISON:  05/07/2024, chest CT 04/19/2024 and PET-CT 05/07/2024 FINDINGS: Lungs are adequately inflated. Mild stable bibasilar interstitial prominence. 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 Stable lateral right midthoracic chest wall mass hypermetabolic on recent PET-CT. Cardiomediastinal silhouette and remainder of the exam including aortic stent graft unchanged. IMPRESSION: 1. No acute cardiopulmonary disease. 2. 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 Stable lateral right midthoracic chest wall mass hypermetabolic on recent PET-CT. Electronically Signed   By: Toribio Agreste M.D.   On: 05/08/2024 10:49   DG Chest 2 View Result Date: 05/07/2024 CLINICAL DATA:  Chest pain. EXAM: CHEST - 2 VIEW COMPARISON:  04/19/2024 FINDINGS: Stent graft involving the aortic arch and descending thoracic aorta is grossly stable. Again noted is a rounded opacity in the right upper lung compatible with patient's known mass measuring roughly 4.5 cm. Focal lateral density along the right mid chest compatible with  the known subpleural lesion. Probable nipple shadow in the right lower chest. No new airspace disease or pulmonary edema. Heart size is within normal limits. Prior median sternotomy. No large pleural effusions.  IMPRESSION: 1. No acute cardiopulmonary disease. 2. Stable appearance of the right lung mass and right subpleural lesion. 3. Stable appearance of the thoracic aortic stent graft. Electronically Signed   By: Juliene Balder M.D.   On: 05/07/2024 11:06   NM PET Image Initial (PI) Skull Base To Thigh (F-18 FDG) Result Date: 05/07/2024 EXAM: PET AND CT SKULL BASE TO MID THIGH 05/07/2024 10:15:00 AM TECHNIQUE: RADIOPHARMACEUTICAL: 7.22 mCi F-18 FDG Uptake time 60 minutes. Glucose level 105 mg/dl. PET imaging was acquired from the base of the skull to the mid thighs. Non-contrast enhanced computed tomography was obtained for attenuation correction and anatomic localization. COMPARISON: CT 04/19/2024. 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 No metabolically active cervical lymphadenopathy. CHEST: Hypermetabolic mass in the right upper lobe measures 3.5 cm with SUV max equal 12.4. Hypermetabolic subpleural nodule on the right lateral chest wall measures 2.3 x 1.1 cm (image 74) with intense metabolic activity (SUV max equal 13.2). No hypermetabolic right hilar lymph nodes or mediastinal lymph nodes. There is intense metabolic activity associated with the proximal aspect of the thoracic aortic stent graft which is favored inflammatory activity. Midline sternotomy noted. No metabolically active lymphadenopathy. ABDOMEN AND PELVIS: Large left inguinal hernia with extensive small bowel and mesentery extending into the left hemiscrotum. No evidence of bowel obstruction. Incidental finding of fem fem bypass graft. Urine contaminations in the groin region. Physiologic activity within the gastrointestinal and genitourinary systems. No metabolically active lymphadenopathy. BONES AND SOFT TISSUE: No  metabolically active involvement of the adjacent chest wall or ribs beyond the described subpleural nodule. No metabolically active aggressive osseous lesion. IMPRESSION: 1. Hypermetabolic right upper lobe mass measuring 3.5 cm consistent with primary bronchogenic carcinoma 2. Hypermetabolic right lateral chest wall subpleural nodule consistent with metastatic disease. No clear chest wall involvement. 3. Intense metabolic activity associated with the proximal aspect of the thoracic aortic stent graft, favored to represent benign inflammatory activity. 4. Absent metabolic activity of the left vocal cord is favored to be nonmalignant as above. 5. Large left inguinal hernia containing small bowel and mesentery extending into the left hemiscrotum, without evidence of bowel obstruction. Electronically signed by: Norleen Boxer MD MD 05/07/2024 10:48 AM EST RP Workstation: [id number]    EKG/Medicine tests: Ordered and Independent interpretation EKG Interpretation: Sinus rhythm Atrial premature complex Left atrial enlargement Probable anteroseptal infarct, old Confirmed by Neysa Clap [phone]) on 05/19/2024 10:18:57 PM  Interventions: Oxycodone , tizanidine   See the EMR for full details regarding lab and imaging results.  Patient presents for continuation of chronic symptoms for which I also evaluated him 5 days ago in the emergency department, and he also has been seen in the interim by Dr. Jakie and Dr. Imelda on 1/20.  Patient does not have any new or different complaints today on comparison to prior.  Screening labs obtained, CBC and BMP are reassuring, therefore doubt any infectious etiology given lack of localizing symptoms for infection, lack of fever, leukocytosis.  No evidence of metabolic derangement.  Troponin is mildly elevated, however this is at baseline for patient, and he does not have any new different symptoms concerning for  unstable angina.  Chest x-ray with similar appearance of aortic graft  on comparison to prior.  Given patient has not had any change in symptoms from several days ago, and had negative workup for dissection and PE at that time, feel that repeating these images at this time would be of low yield.  Ultimately during prior presentations, pain was felt to be due to right lung mass, which I feel is likely also the etiology of his symptoms today.  Will give patient a 3-day refill on his pain medication to get him through the weekend until he is able to contact his outpatient team for further recommendations for pain management, that I emphasized with patient that that is important that he follows up with his outpatient team for further refills.  Presentation is most consistent with acute complicated illness, Current presentation is complicated by underlying chronic conditions, and I did consider and rule out acute life/limb-threatening illness  Discussion of management or test interpretations with external provider(s): Not indicated  Risk Drugs:OTC drugs and Prescription drug management  Disposition: DISCHARGE: I believe that the patient is safe for discharge home with outpatient follow-up. Patient was informed of all pertinent physical exam, laboratory, and imaging findings.  Patient's suspected etiology of their symptom presentation was discussed with the patient and all questions were answered. We discussed following up with PCP, outpatient oncology team. I provided thorough ED return precautions. The patient feels safe and comfortable with this plan.  MDM generated using voice dictation software and may contain dictation errors.  Please contact me for any clarification or with any questions.  Clinical Impression:  1. Chest pain, unspecified type      Discharge   Final Clinical Impression(s) / ED Diagnoses Final diagnoses:  Chest pain, unspecified type    Rx / DC Orders ED Discharge Orders          Ordered    oxyCODONE  (ROXICODONE ) 5 MG immediate release  tablet  Every 6 hours PRN,   Status:  Discontinued        05/18/24 2155    tiZANidine  (ZANAFLEX ) 4 MG tablet  Every 6 hours PRN,   Status:  Discontinued        05/18/24 2155    oxyCODONE  (ROXICODONE ) 5 MG immediate release tablet  Every 6 hours PRN        05/18/24 2207    tiZANidine  (ZANAFLEX ) 4 MG tablet  Every 6 hours PRN        05/18/24 2207             Rogelia Jerilynn RAMAN, MD 05/22/24 1528  "

## 2024-05-18 NOTE — ED Provider Triage Note (Signed)
 Emergency Medicine Provider Triage Evaluation Note  Victor [name] , a 69 y.o. male  was evaluated in triage.  Pt complains of body pains.  Patient reports that he has had these pain for a while now.  He has been seen in the ER multiple times for this pain.  Patient states that he comes to the ER about twice a week for this.  There is no new pain.  Review of Systems  Positive: Leg pain Negative:   Physical Exam  BP 132/76 (BP Location: Left Arm)   Pulse 92   Temp 98.6 F (37 C) (Oral)   Resp 18   SpO2 100%  Gen:   Awake, no distress   Resp:  Normal effort  MSK:   Moves extremities without difficulty  Other:    Medical Decision Making  Medically screening exam initiated at 5:23 PM.  Appropriate orders placed.  Victor [name] was informed that the remainder of the evaluation will be completed by another provider, this initial triage assessment does not replace that evaluation, and the importance of remaining in the ED until their evaluation is complete.     Victor [name], [place] 05/18/24 1723

## 2024-05-21 ENCOUNTER — Other Ambulatory Visit: Payer: Self-pay

## 2024-05-21 ENCOUNTER — Encounter (HOSPITAL_COMMUNITY): Payer: Self-pay | Admitting: Pulmonary Disease

## 2024-05-21 NOTE — Progress Notes (Signed)
 SDW CALL  Patient's sister, Tylon Kemmerling [facility]) was given pre-op instructions over the phone. The opportunity was given for Ms.Debrosse to ask questions. No further questions asked. Ms. Mata verbalized understanding of instructions given.   PCP - denies Cardiologist - Vinie Hilty,MD  PPM/ICD -  Device Orders -  Rep Notified -   Chest x-ray - 05/18/24 EKG - 05/18/24 Stress Test - 11/2016-Nuclear med ECHO - 11/2016.Pt has echo scheduled for 05/30/24 Cardiac Cath - 01/11/17  Sleep Study - denies CPAP -   Fasting Blood Sugar - na Checks Blood Sugar _____ times a day  Blood Thinner Instructions:na Aspirin  Instructions:pt not taking  ERAS Protcol -NPO PRE-SURGERY Ensure or G2-   COVID TEST- na   Anesthesia review: yes - Hx coronary artery calcification,thoracic aortic dissection,spinal stroke,chronic pain syndrome, polysubstance abuse.   Patient's sister denies  patient having new shortness of breath, fever, cough and chest pain over the phone call  Oral Hygiene is also important to reduce your risk of infection.  Remember - BRUSH YOUR TEETH THE MORNING OF SURGERY WITH YOUR REGULAR TOOTHPASTE

## 2024-05-22 ENCOUNTER — Ambulatory Visit: Payer: Self-pay | Admitting: Nurse Practitioner

## 2024-05-22 ENCOUNTER — Ambulatory Visit (HOSPITAL_COMMUNITY)

## 2024-05-23 ENCOUNTER — Encounter (HOSPITAL_COMMUNITY): Payer: Self-pay

## 2024-05-23 ENCOUNTER — Emergency Department (HOSPITAL_COMMUNITY)

## 2024-05-23 ENCOUNTER — Other Ambulatory Visit: Payer: Self-pay

## 2024-05-23 ENCOUNTER — Telehealth: Payer: Self-pay | Admitting: Pulmonary Disease

## 2024-05-23 ENCOUNTER — Inpatient Hospital Stay (HOSPITAL_COMMUNITY)
Admission: EM | Admit: 2024-05-23 | Discharge: 2024-05-28 | DRG: 193 | Disposition: A | Attending: Hospitalist | Admitting: Hospitalist

## 2024-05-23 ENCOUNTER — Ambulatory Visit: Payer: Self-pay

## 2024-05-23 ENCOUNTER — Encounter: Payer: Self-pay | Admitting: Medical Oncology

## 2024-05-23 DIAGNOSIS — J189 Pneumonia, unspecified organism: Secondary | ICD-10-CM | POA: Diagnosis present

## 2024-05-23 DIAGNOSIS — J121 Respiratory syncytial virus pneumonia: Principal | ICD-10-CM | POA: Diagnosis present

## 2024-05-23 DIAGNOSIS — R051 Acute cough: Secondary | ICD-10-CM

## 2024-05-23 DIAGNOSIS — Z87442 Personal history of urinary calculi: Secondary | ICD-10-CM

## 2024-05-23 DIAGNOSIS — E042 Nontoxic multinodular goiter: Secondary | ICD-10-CM | POA: Diagnosis present

## 2024-05-23 DIAGNOSIS — I110 Hypertensive heart disease with heart failure: Secondary | ICD-10-CM | POA: Diagnosis present

## 2024-05-23 DIAGNOSIS — K4090 Unilateral inguinal hernia, without obstruction or gangrene, not specified as recurrent: Secondary | ICD-10-CM | POA: Diagnosis present

## 2024-05-23 DIAGNOSIS — Z8673 Personal history of transient ischemic attack (TIA), and cerebral infarction without residual deficits: Secondary | ICD-10-CM

## 2024-05-23 DIAGNOSIS — I2510 Atherosclerotic heart disease of native coronary artery without angina pectoris: Secondary | ICD-10-CM | POA: Diagnosis present

## 2024-05-23 DIAGNOSIS — Z1152 Encounter for screening for COVID-19: Secondary | ICD-10-CM

## 2024-05-23 DIAGNOSIS — J439 Emphysema, unspecified: Secondary | ICD-10-CM | POA: Diagnosis present

## 2024-05-23 DIAGNOSIS — R918 Other nonspecific abnormal finding of lung field: Secondary | ICD-10-CM | POA: Insufficient documentation

## 2024-05-23 DIAGNOSIS — I7123 Aneurysm of the descending thoracic aorta, without rupture: Secondary | ICD-10-CM | POA: Diagnosis present

## 2024-05-23 DIAGNOSIS — Z87891 Personal history of nicotine dependence: Secondary | ICD-10-CM

## 2024-05-23 DIAGNOSIS — I700 Atherosclerosis of aorta: Secondary | ICD-10-CM | POA: Diagnosis present

## 2024-05-23 DIAGNOSIS — I739 Peripheral vascular disease, unspecified: Secondary | ICD-10-CM | POA: Diagnosis present

## 2024-05-23 DIAGNOSIS — I5022 Chronic systolic (congestive) heart failure: Secondary | ICD-10-CM | POA: Diagnosis present

## 2024-05-23 DIAGNOSIS — E041 Nontoxic single thyroid nodule: Secondary | ICD-10-CM

## 2024-05-23 DIAGNOSIS — Z7982 Long term (current) use of aspirin: Secondary | ICD-10-CM

## 2024-05-23 DIAGNOSIS — Z95820 Peripheral vascular angioplasty status with implants and grafts: Secondary | ICD-10-CM

## 2024-05-23 DIAGNOSIS — I959 Hypotension, unspecified: Secondary | ICD-10-CM | POA: Diagnosis present

## 2024-05-23 DIAGNOSIS — I7102 Dissection of abdominal aorta: Secondary | ICD-10-CM | POA: Diagnosis present

## 2024-05-23 DIAGNOSIS — R079 Chest pain, unspecified: Secondary | ICD-10-CM

## 2024-05-23 DIAGNOSIS — G893 Neoplasm related pain (acute) (chronic): Secondary | ICD-10-CM | POA: Diagnosis present

## 2024-05-23 DIAGNOSIS — R0602 Shortness of breath: Principal | ICD-10-CM

## 2024-05-23 DIAGNOSIS — C3411 Malignant neoplasm of upper lobe, right bronchus or lung: Secondary | ICD-10-CM | POA: Diagnosis present

## 2024-05-23 LAB — CBC WITH DIFFERENTIAL/PLATELET
Abs Immature Granulocytes: 0.01 10*3/uL (ref 0.00–0.07)
Basophils Absolute: 0 10*3/uL (ref 0.0–0.1)
Basophils Relative: 1 %
Eosinophils Absolute: 0.3 10*3/uL (ref 0.0–0.5)
Eosinophils Relative: 6 %
HCT: 41.3 % (ref 39.0–52.0)
Hemoglobin: 12.9 g/dL — ABNORMAL LOW (ref 13.0–17.0)
Immature Granulocytes: 0 %
Lymphocytes Relative: 20 %
Lymphs Abs: 1.1 10*3/uL (ref 0.7–4.0)
MCH: 28.4 pg (ref 26.0–34.0)
MCHC: 31.2 g/dL (ref 30.0–36.0)
MCV: 91 fL (ref 80.0–100.0)
Monocytes Absolute: 0.5 10*3/uL (ref 0.1–1.0)
Monocytes Relative: 9 %
Neutro Abs: 3.6 10*3/uL (ref 1.7–7.7)
Neutrophils Relative %: 64 %
Platelets: 273 10*3/uL (ref 150–400)
RBC: 4.54 MIL/uL (ref 4.22–5.81)
RDW: 13.6 % (ref 11.5–15.5)
WBC: 5.6 10*3/uL (ref 4.0–10.5)
nRBC: 0 % (ref 0.0–0.2)

## 2024-05-23 LAB — I-STAT CG4 LACTIC ACID, ED: Lactic Acid, Venous: 3 mmol/L (ref 0.5–1.9)

## 2024-05-23 MED ORDER — SODIUM CHLORIDE 0.9 % IV BOLUS
1000.0000 mL | Freq: Once | INTRAVENOUS | Status: AC
  Administered 2024-05-23: 1000 mL via INTRAVENOUS

## 2024-05-23 MED ORDER — LACTATED RINGERS IV SOLN: INTRAVENOUS | Status: DC

## 2024-05-23 MED ORDER — IOHEXOL 350 MG/ML SOLN
100.0000 mL | Freq: Once | INTRAVENOUS | Status: AC | PRN
  Administered 2024-05-23: 100 mL via INTRAVENOUS

## 2024-05-23 MED ORDER — SODIUM CHLORIDE 0.9 % IV BOLUS
500.0000 mL | Freq: Once | INTRAVENOUS | Status: AC
  Administered 2024-05-24: 500 mL via INTRAVENOUS

## 2024-05-23 MED ORDER — SODIUM CHLORIDE 0.9 % IV SOLN
500.0000 mg | Freq: Once | INTRAVENOUS | Status: AC
  Administered 2024-05-24: 500 mg via INTRAVENOUS
  Filled 2024-05-23: qty 5

## 2024-05-23 MED ORDER — SODIUM CHLORIDE 0.9 % IV SOLN
100.0000 mg | Freq: Once | INTRAVENOUS | Status: AC
  Administered 2024-05-24: 100 mg via INTRAVENOUS
  Filled 2024-05-23: qty 100

## 2024-05-23 NOTE — ED Provider Notes (Signed)
 " [place] EMERGENCY DEPARTMENT AT [facility] Provider Note   CSN: [id number] Arrival date & time: 05/23/24  2138     Patient presents with: Shortness of Breath   Victor [name] is a 69 y.o. male.   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 At time of TEVAR, LVEF 45-50% with normal coronaries by cath 12/2016. Overnight admission 05/08/2024 for recent RUL lung mass with uncontrolled right sided chest pain. CT scan showed evidence of 3.5 cm mass concerning for primary bronchogenic carcinoma.   Presenting for acute onset severe chest pain with radiation to the back, shortness of breath.  EMS reports a blood pressure of 52/44 in the left arm and 104/62 in the right arm.  Patient reports chills and a cough.  Denies any sensation or motor deficits in the upper or lower extremities.  No bowel or urinary incontinence.  States chest and back pain is somewhat chronic.  The history is provided by the patient. No language interpreter was used.  Shortness of Breath Associated symptoms: chest pain and cough   Associated symptoms: no abdominal pain, no ear pain, no fever, no rash, no sore throat and no vomiting        Prior to Admission medications  Medication Sig Start Date End Date Taking? Authorizing Provider  aspirin  325 MG tablet Take 0.5 tablets (162.5 mg total) by mouth daily. Patient not taking: Reported on 05/11/2024 09/06/18    Whitfield Raisin, NP  gabapentin  (NEURONTIN ) 300 MG capsule Take 2 capsules (600 mg total) by mouth 3 (three) times daily. Patient not taking: Reported on 05/11/2024 10/17/23   Sherrine Birmingham, DO  lidocaine  (LIDODERM ) 5 % Place 2 patches onto the skin daily. Remove & Discard patch within 12 hours or as directed by MD Patient not taking: Reported on 05/11/2024 05/10/24   Gherghe, Costin M, MD  metoprolol  tartrate (LOPRESSOR ) 50 MG tablet Take 1 tablet (50 mg total) by mouth once. Take 90-120 minutes prior to scan. Hold for SBP less than 110. 05/11/24 05/15/24  Monge, Damien BROCKS, NP  oxyCODONE  10 MG TABS Take 1 tablet (10 mg total) by mouth every 4 (four) hours as needed for severe pain (pain score 7-10). 05/15/24   Tegeler, Lonni PARAS, MD  Oxycodone  HCl 10 MG TABS Take 1 tablet (10 mg total) by mouth every 6 (six) hours as needed. Patient not taking: Reported on 05/11/2024 05/09/24   Trixie Nilda HERO, MD  tiZANidine  (ZANAFLEX ) 4 MG tablet Take 1 tablet (4 mg total) by mouth every 6 (six) hours as needed for muscle spasms. 05/13/24   Rogelia Jerilynn RAMAN, MD  tiZANidine  (ZANAFLEX ) 4 MG tablet Take 1 tablet (4 mg total) by mouth every 6 (six) hours as needed for up to 5 days for muscle spasms. 05/18/24 05/23/24  Rogelia Jerilynn RAMAN, MD    Allergies: Patient has no known allergies.    Review of Systems  Constitutional:  Negative for chills and fever.  HENT:  Negative for ear pain and sore throat.   Eyes:  Negative for pain and visual disturbance.  Respiratory:  Positive for cough and shortness of breath.   Cardiovascular:  Positive for chest pain. Negative for palpitations.  Gastrointestinal:  Negative for abdominal pain and vomiting.  Genitourinary:  Negative for dysuria and hematuria.  Musculoskeletal:  Positive for back pain. Negative for arthralgias.  Skin:  Negative for color change and rash.  Neurological:  Negative for seizures and syncope.  All other systems reviewed and are negative.   Updated  Vital Signs BP 91/72   Pulse 66   Temp 97.7 F (36.5 C) (Oral)   Resp 10   SpO2 95%   Physical Exam Vitals and nursing note reviewed.  Constitutional:      General: He is not in acute distress.    Appearance: He is well-developed.  HENT:     Head: Normocephalic and atraumatic.  Eyes:     Conjunctiva/sclera: Conjunctivae normal.  Cardiovascular:     Rate and Rhythm: Normal rate and regular rhythm.     Heart sounds: No murmur heard. Pulmonary:     Effort: Pulmonary effort is normal. No respiratory distress.     Breath sounds: Normal breath sounds.  Abdominal:     Palpations: Abdomen is soft.     Tenderness: There is no abdominal tenderness.  Musculoskeletal:        General: No swelling.     Cervical back: Neck supple.  Skin:    General: Skin is warm and dry.     Capillary Refill: Capillary refill takes less than 2 seconds.  Neurological:     Mental Status: He is alert.  Psychiatric:        Mood and Affect: Mood normal.     (all labs ordered are listed, but only abnormal results are displayed) Labs Reviewed  CBC WITH DIFFERENTIAL/PLATELET - Abnormal; Notable for the following components:      Result Value   Hemoglobin 12.9 (*)    All other components within normal limits  I-STAT CG4 LACTIC ACID, ED - Abnormal; Notable for the following components:   Lactic Acid, Venous 3.0 (*)    All other components within normal limits  RESP PANEL BY RT-PCR (RSV, FLU A&B, COVID)  [id number]  CULTURE, BLOOD (ROUTINE X 2)  CULTURE, BLOOD (ROUTINE X 2)  URINALYSIS, W/ REFLEX TO CULTURE (INFECTION SUSPECTED)  COMPREHENSIVE METABOLIC PANEL WITH GFR  LIPASE, BLOOD  PROTIME-INR  I-STAT CG4 LACTIC ACID, ED  I-STAT CG4 LACTIC ACID, ED  TROPONIN T, HIGH SENSITIVITY  TROPONIN T, HIGH SENSITIVITY    EKG: None  Radiology: CT Angio Chest/Abd/Pel for Dissection W and/or Wo Contrast Result Date: 05/23/2024 CLINICAL DATA:  Shortness of breath right-sided chest pain EXAM: CT ANGIOGRAPHY  CHEST, ABDOMEN AND PELVIS TECHNIQUE: Non-contrast CT of the chest was initially obtained. Multidetector CT imaging through the chest, abdomen and pelvis was performed using the standard protocol during bolus administration of intravenous contrast. Multiplanar reconstructed images and MIPs were obtained and reviewed to evaluate the vascular anatomy. RADIATION DOSE REDUCTION: This exam was performed according to the departmental dose-optimization program which includes automated exposure control, adjustment of the mA and/or kV according to patient size and/or use of iterative reconstruction technique. CONTRAST:  OMNIPAQUE  IOHEXOL  350 MG/ML SOLN COMPARISON:  Chest x-ray 05/23/2024, CT 05/15/2024, 05/13/2024, PET CT 05/07/2024, 04/19/2024, 03/07/2017, 12/09/2016 FINDINGS: CTA CHEST FINDINGS Cardiovascular: Non contrasted images of the chest demonstrate no acute intramural hematoma. Postoperative appearance of the aorta with graft repair of the ascending aorta, and endovascular stent graft  repair of the aortic arch and descending thoracic aorta. 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 No significant pericardial effusion Mediastinum/Nodes: Patent trachea. Multiple thyroid nodules measuring up to 17 mm. Mildly enlarged right paratracheal nodes measuring up to 15 mm as before. Esophagus within normal limits. Lungs/Pleura: Emphysema. Spiculated right upper lobe pulmonary mass measuring 3.5 cm, previously 3.5 cm. This is contiguous with bandlike scarring in the right upper lobe. Right lateral upper lobe pleural mass measures 2.9 by 1.1 cm on series 6, image 73, hypermetabolic on prior PET CT. Interval airspace disease at the right middle lobe and progressive peribronchovascular  ground-glass and mild consolidation at the right greater than left lower lobes. No pleural effusion or pneumothorax. Musculoskeletal: No acute or suspicious osseous abnormality. Sternotomy. Mild chronic superior endplate deformities at T11 and T12 Review of the MIP images confirms the above findings. CTA ABDOMEN AND PELVIS FINDINGS VASCULAR Aorta: Chronic dissection of the abdominal aorta as before, this terminates at the distal infrarenal abdominal aorta, just proximal to the IMA origin. Aneurysmal dilatation of the proximal abdominal aorta at the distal portion of the aortic stent and slightly distal to with, this measures 5.7 x 5 cm on series 9, image 133, previously 5.8 cm. This has increased compared to prior exams from 2018. 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 No aneurysm. There is mild to moderate stenosis slightly distal to the origin with wide patency distally SMA: Patent without evidence of aneurysm, dissection, vasculitis or significant stenosis. Arises from the true lumen Renals: Fenestration at the level of the renal arteries providing cross flow. Right renal artery arises from the false lumen, main left renal artery arises from the true lumen. Both arteries are patent and without stenosis or aneurysm. Distal left accessory lower pole artery arising from the distal aorta is patent. IMA: Patent without evidence of aneurysm, dissection, vasculitis or significant stenosis. Inflow: Atherosclerosis. No aneurysm or dissection. Chronically occluded femoral femoral bypass graft. Aneurysmal dilatation of the right greater than left common femoral artery, measures 2.4 cm on the right and 2.3 cm on the left. Veins: Suboptimally assessed Review of the MIP images confirms the above findings. NON-VASCULAR Hepatobiliary: No focal liver abnormality is seen. No gallstones, gallbladder wall thickening, or biliary dilatation. Pancreas:  Unremarkable. No pancreatic ductal dilatation or surrounding inflammatory changes. Spleen: Normal in size without focal abnormality. Adrenals/Urinary Tract: Adrenal glands are normal. No hydronephrosis. The bladder is unremarkable Stomach/Bowel: Stomach within normal limits. No dilated small bowel. No acute bowel wall thickening. Lymphatic: No suspicious lymph nodes Reproductive: Negative for mass Other: . No ascites or free air. Large left inguinal hernia containing mesenteric fat and small bowel, extending into the scrotum. No evidence for and incarceration or obstruction Musculoskeletal: Chronic compression deformity at L1 Review of the MIP images confirms the above findings. IMPRESSION: 1. Negative for acute aortic dissection. 2. Postoperative changes of the thoracic aorta with graft repair of the ascending aorta and endovascular stent graft repair of the aortic arch and descending thoracic aorta. 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 3. 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 Continued vascular surgery follow-up recommended. 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 4. Emphysema. Interval airspace disease at the right middle lobe and progressive peribronchovascular ground-glass and mild consolidation at the right greater than left lower lobes, suspect for pneumonia. 5. Spiculated right upper lobe pulmonary mass measuring 3.5 cm, concerning for malignancy. Right lateral upper lobe pleural mass measures 2.9 x 1.1 cm, hypermetabolic on prior PET CT and consistent with malignancy. 6. Large left inguinal hernia containing mesenteric fat and small bowel, extending into the  scrotum. No evidence for incarceration or obstruction. 7. Multiple thyroid nodules measuring up to 17 mm. Nonemergent thyroid ultrasound previously recommended if not already performed Aortic Atherosclerosis (ICD10-I70.0) and Emphysema (ICD10-J43.9). Electronically Signed   By: Luke Bun M.D.   On: 05/23/2024 23:38   DG Chest Port 1 View if patient is in a treatment room. Result Date: 05/23/2024 EXAM: 1 VIEW(S) XRAY OF THE CHEST 05/23/2024 10:18:30 PM COMPARISON: 05/18/2024 CLINICAL HISTORY: Suspected sepsis. FINDINGS: LUNGS AND PLEURA: Lower lung volumes compared to prior exam. Opacity at lung bases. Small right pleural effusion. Stable 4 cm right upper lobe mass. Stable pleural thickening along right lateral chest. No pneumothorax. HEART AND MEDIASTINUM: Median sternotomy and thoracic aortic stent graft noted. No acute abnormality of the cardiac and mediastinal silhouettes. BONES AND SOFT TISSUES: No acute osseous abnormality. IMPRESSION: 1. Low lung volumes with opacity at the lung bases and a small right pleural effusion. 2. Stable 4 cm right upper lobe mass. Electronically signed by: Elsie Gravely MD 05/23/2024 10:25 PM EST RP Workstation: [id number]     .Critical Care  Performed by: Elnor Bernarda SQUIBB, DO Authorized by: Elnor Bernarda SQUIBB, DO   Critical care provider statement:    Critical care time (minutes):  102   Critical care was time spent personally by me on the following activities:  Development of treatment plan with patient or surrogate, discussions with consultants, evaluation of patient's response to treatment, examination of patient, ordering and review of laboratory studies, ordering and review of radiographic studies, ordering and performing treatments and interventions, pulse oximetry, re-evaluation of patient's condition and review of old charts   Care discussed with: admitting provider      Medications Ordered in the ED  lactated ringers  infusion (has no administration in time  range)  doxycycline  (VIBRAMYCIN ) 100 mg in sodium chloride  0.9 % 250 mL IVPB (has no administration in time range)  azithromycin  (ZITHROMAX ) 500 mg in sodium chloride  0.9 % 250 mL IVPB (has no administration in time range)  sodium chloride  0.9 % bolus 500 mL (has no administration in time range)  sodium chloride  0.9 % bolus 1,000 mL (0 mLs Intravenous Stopped 05/23/24 2356)  iohexol  (OMNIPAQUE ) 350 MG/ML injection 100 mL (100 mLs Intravenous Contrast Given 05/23/24 2236)                                    Medical Decision Making Amount and/or Complexity of Data Reviewed Labs: ordered. Radiology: ordered.  Risk Prescription drug management. Decision regarding hospitalization.   69 year old male with very extensive past medical history including a type I aortic dissection in 2016 with multiple related procedures after.  Please see HPI for further details.  Presenting for acute onset severe chest pain with radiation to the back, chills, and cough.  Patient is alert and oriented x 3, hypotensive with a blood pressure of 85/70.  No diaphoresis.  Pain rated as minimal at this time.  Obvious initial concern is to rule out a repeat dissection.  Labs waived and patient sent to CT.  IV fluids initiated in the process.  Patient remains neurovascularly intact.  Twelve-lead EKG stable.  Labs pending.  No fever at this time.  Differential also includes sepsis.  Will initiate antibiotics if source of infection or leukocytosis.  No clear sepsis at this time.  CT demonstrates: 1. Negative for acute aortic dissection. 2. Postoperative changes of the thoracic aorta with graft repair of the ascending aorta and endovascular stent graft repair of the aortic arch and descending thoracic aorta. 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 3. 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 Continued vascular surgery follow-up recommended. 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 4. Emphysema. Interval airspace disease at the right middle lobe and progressive peribronchovascular ground-glass and mild consolidation at the right greater than left lower lobes, suspect for pneumonia. 5. Spiculated right upper lobe pulmonary mass measuring 3.5 cm, concerning for malignancy. Right lateral upper lobe pleural mass measures 2.9 x 1.1 cm, hypermetabolic on prior PET CT and consistent with malignancy. 6. Large left inguinal hernia containing mesenteric fat and small bowel, extending into the scrotum. No evidence for incarceration or obstruction. 7. Multiple thyroid nodules measuring up to 17 mm. Nonemergent thyroid ultrasound previously recommended if not already performed   Pressure responsive to IV fluid.  No vasopressors recommended at this time.  Blood pressure currently 91/72 after first liter.  Lactic acid came back at 3.  Doxycycline  and azithromycin  ordered for pneumonia found on CT.  No leukocytosis on CBC.  500 cc IV fluids ordered.  Will repeat lactic acid after fluids from second bolus and antibiotics are completed.   Accepted by Dr. Alfornia.  Final diagnoses:  SOB (shortness of breath)  Acute cough  Pneumonia due to infectious organism, unspecified laterality, unspecified part of lung  Chest pain, unspecified type  Hypotension, unspecified hypotension type    ED Discharge Orders     None         Elnor Bernarda SQUIBB, DO 05/24/24 0031  "

## 2024-05-23 NOTE — ED Notes (Signed)
 Pt. I-stat CG4 Lactic Acid results 2.95, EDP, Elnor made aware.
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
## 2024-05-23 NOTE — Telephone Encounter (Signed)
 I have got this resc

## 2024-05-23 NOTE — Telephone Encounter (Signed)
 Please re-schedule patient's bronchoscopy to the week of February 16th as he is undergoing cardiac clearance.  Thanks, JD

## 2024-05-23 NOTE — Telephone Encounter (Signed)
I will work on this one.  °

## 2024-05-23 NOTE — ED Triage Notes (Signed)
 Pt BIB EMS with reports of shob that worsened tonight. Pt reports having a mass in his chest. Pt has right sided chest pain. Rhonchi in both lower lobes, initial bp left arm 52/44, right arm 104/62.   18 g forearm left 20 g ac right 650 mg tylenol  po 4L [place]  500 ml ns  400 lactated ringers  given
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
## 2024-05-23 NOTE — Telephone Encounter (Signed)
 Thank you Chantel.  JD

## 2024-05-23 NOTE — Anesthesia Preprocedure Evaluation (Signed)
"                                    Anesthesia Evaluation    Reviewed: Allergy & Precautions, Patient's Chart, lab work & pertinent test results  Airway        Dental   Pulmonary neg pulmonary ROS, former smoker          Cardiovascular hypertension, negative cardio ROS      Neuro/Psych Spinal cord infarction  Neuromuscular disease CVA    GI/Hepatic negative GI ROS, Neg liver ROS,,,  Endo/Other  negative endocrine ROS    Renal/GU negative Renal ROS     Musculoskeletal   Abdominal   Peds  Hematology negative hematology ROS (+)   Anesthesia Other Findings Right Lung Mass  Reproductive/Obstetrics                              Anesthesia Physical Anesthesia Plan Anesthesia Quick Evaluation  "

## 2024-05-23 NOTE — Telephone Encounter (Signed)
 I got this resc to 2/16 with RB letter has been redone and follow up appt resc patient is aware

## 2024-05-23 NOTE — Progress Notes (Signed)
 Anesthesia Chart Review: [name]  Case: [id number] Date/Time: 05/24/24 1303   Procedure: VIDEO BRONCHOSCOPY WITH ENDOBRONCHIAL NAVIGATION (Right)   Anesthesia type: General   Diagnosis: Mass of upper lobe of right lung [R91.8]   Pre-op diagnosis: Right Lung Mass   [facility] / [facility]   Surgeons: Victor Dorn NOVAK, MD       DISCUSSION: Patient is a 69 year old male scheduled for the above procedure.   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 At time of TEVAR, LVEF 45-50% with normal coronaries by cath 12/2016.  Overnight admission 05/08/2024 for recent RUL lung mass with uncontrolled right sided chest pain. CT scan showed evidence of 3.5 cm mass concerning for primary bronchogenic carcinoma. He had seen oncology as an outpatient with PET scan on 05/08/2023 and plans for bronchoscopy by pulmonology.  He had missed some appointments due to transportation issues. He had additional ED visits on 05/15/2024 and 05/18/2024 for acute on chronic pain. Notes reviewed. He has had chronically elevated troponins 40's-70's since December. RUL mass felt to be the etiology of his symptoms.   Re-established with cardiology on 05/11/2024 (since 2019) with Victor Perkins, NP. He had not had follow-up with vascular since 2019 either. Although chest pain and SOB may be related to lung mass, CCTA was ordered given elevated troponins and coronary calcifications. She also ordered an echo for 2/6  murmur and a carotid US  for right carotid bruit. She was also going to discuss TAA monitoring with Dr. Marla need to ultimately get reestablished with vascular and/or CT surgery. Carotid US  was done on 05/18/2024 showed minimal BICA plaque, stenotic right vertebral artery with diastolic reversal, left vertebral artery retrograde flow,, right [place] artery atypical flow, and left [place] artery bidirectional flow. It looks as though his TTE is scheduled for 05/30/2024 and CCTA for 06/04/2024.   I discussed with anesthesiologist Patrisha Motto, MD. He reviewed recent cardiology evaluation and pending testing. Advised postponing procedure until cardiac testing completed and known cardiology recommendations.  I have discussed with Dr. Kara. Of note, [name]' sister had called inquiring about potentially rescheduling procedure due to the weather and icy driveway/road conditions and was awaiting return call from pulmonology. Given above, Dr. Kara to reschedule--discussed if cardiac testing is delayed and concern about postponing surgery further out then could potentially readdress with anesthesiologist and/or cardiologist.   VS:  Wt Readings from Last 3 Encounters:  05/11/24 64.4 kg  05/08/24 65.7 kg  05/07/24 65.8 kg   BP Readings from Last 3 Encounters:  05/18/24 103/76  05/15/24 (!) 151/89  05/13/24 (!) 124/92   Pulse Readings from Last 3 Encounters:  05/18/24 80  05/15/24 87  05/13/24 88    PROVIDERS: Patient, No Pcp Per Mona Vinie BROCKS, MD is cardiologist   LABS: Most recent lab results in [facility] include: Lab Results  Component Value Date   WBC 6.4 05/18/2024   HGB 14.4 05/18/2024   HCT 44.9 05/18/2024   PLT 354 05/18/2024   GLUCOSE 84 05/18/2024   CHOL 152 05/11/2024   TRIG 64  05/11/2024   HDL 56 05/11/2024   LDLCALC 83 05/11/2024   ALT 10 05/07/2024   AST 15 05/07/2024   NA 140 05/18/2024   K 4.6 05/18/2024   CL 104 05/18/2024   CREATININE 0.97 05/18/2024   BUN 13 05/18/2024    CO2 27 05/18/2024   TSH 1.456 01/25/2017   INR 1.16 02/09/2018   HGBA1C 5.8 (H) 05/11/2024    IMAGES: CXR 05/18/2024: IMPRESSION: 1. No acute cardiopulmonary abnormality, with no developing consolidation, pleural effusion, or pneumothorax. 2. Postoperative changes with descending thoracic aortic graft and sternotomy wires. 3. 4.6 cm right upper lobe mass laterally, unchanged since prior study. 4. Focal pleural thickening along the right lateral chest, unchanged. 5. Linear atelectasis in the right lung base.   CTA Chest/abd/pelvis 05/15/2024: IMPRESSION: 1. Stable appearance of the thoracic aorta status post graft repair of the ascending aorta and stent graft repair of the aortic arch and descending thoracic aorta. No evidence for perigraft soft tissue stranding or mediastinal hematoma to suggest leakage. 2. 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 This is stable compared with recent priors, increased compared to more remote exam from 2018. 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 3. 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 Aneurysmal dilatation of the right greater than left common femoral arteries, also stable compared with recent priors 4. Right apical lung mass measuring up to 3.5 cm and PET positive, concerning for malignancy. Right upper lobe lateral subpleural/pleural nodule measuring 2.8 cm, also hypermetabolic on recent PET imaging and concerning for metastatic disease. 5. Large left inguinal hernia containing mesenteric fat and small bowel, extending into the scrotum, no obstruction or features for incarceration. 6. Multiple thyroid nodules, largest in the right lobe measures 1.7 cm. Recommend nonemergent  thyroid US  if not already performed (ref: J Am Coll Radiol. 2015 Feb;12(2): 143-50). - Aortic Atherosclerosis (ICD10-I70.0) and Emphysema (ICD10-J43.9).  MRI Abd 05/08/2024: IMPRESSION: 1. No focal hepatic or pancreatic lesions. 2. Partially imaged enhancing right lateral pleural-based mass measures 2.6 x 1.7 cm. 3. A 1.1 x 0.7 cm triangular enhancing focus within the right retroperitoneal region demonstrates diffusion restriction and is not well appreciated on prior examinations. This finding is indeterminate but suspicious for metastatic disease. Recommend attention on follow-up. 4. 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       EKG: 05/18/2024: Sinus rhythm Atrial premature complex Left atrial enlargement Probable anteroseptal infarct, old Confirmed by Neysa Clap [phone]) on 05/19/2024 10:18:57 PM   CV: Carotid US  05/18/2024: Summary:  Right Carotid: The extracranial vessels were near-normal with only minimal  wall thickening or plaque.  - Left Carotid: The extracranial vessels were near-normal with only minimal  Wall thickening or plaque.  - Vertebrals:  Right vertebral artery is stenotic with diastolic reversal.  Left vertebral artery exhibits retrograde flow.  - Subclavians: Right subclavian artery exhibits atypical flow. The left  subclavian artery exhibits bidirectional flow.    Echo 01/21/2017 (at time of aortic debranching/TEVR): Left ventricle: Concentric hypertrophy of moderate severity. LV systolic  function is mildly reduced with an EF of 45-50%. There are no obvious wall  motion abnormalities.   Septum: No Patent Foramen Ovale present.   Left atrium: Patent foramen ovale not  present.   Aortic valve: The valve is trileaflet. Trace regurgitation.   Aorta: The aortic root is dilated. Aneurysm present in the transverse  aorta and descending aorta. DeBakey type III dissection present between  the  ascending aorta and the descending aorta.   Mitral valve: Trace regurgitation.   Right ventricle: Normal cavity size.   Tricuspid valve: Mild regurgitation.   Aorta: Graft present in the descending aorta and transverse aorta.   Cardiac cath 01/11/2017: 1. Widely patent, angiographically normal, dominant RCA 2. Angiographically normal LAD and LCx 3. Mild narrowing at the ostium of the left main without pressure dampening 4. Normal right heart hemodynamics  Past Medical History:  Diagnosis Date   Cancer [facility])    History of kidney stones    Hypertension    Spinal cord infarction (HCC) 02/2017   Stroke [facility])    Thoracic aortic aneurysm 09/2014    Past Surgical History:  Procedure Laterality Date   ASCENDING AORTIC ROOT REPLACEMENT N/A 01/21/2017   Procedure: Replacement of Aortic Arch with circulatory arrest;  Surgeon: Army Dallas NOVAK, MD;  Location: [facility] OR;  Service: Open Heart Surgery;  Laterality: N/A;   CARDIAC CATHETERIZATION     FEMORAL-FEMORAL BYPASS GRAFT N/A 10/07/2014   Procedure: LEFT  FEMORAL ARTERY -RIGHT FEMORAL ARTERY BYPASS GRAFT USING X 30 CM HEMASHIELD GOLD GRAFT;  Surgeon: Krystal JULIANNA Doing, MD;  Location: [facility] OR;  Service: Vascular;  Laterality: N/A;   RIGHT/LEFT HEART CATH AND CORONARY ANGIOGRAPHY N/A 01/11/2017   Procedure: RIGHT/LEFT HEART CATH AND CORONARY ANGIOGRAPHY- Diagnostic Only;  Surgeon: Wonda Sharper, MD;  Location: [facility] INVASIVE CV LAB;  Service: Cardiovascular;  Laterality: N/A;   s/p thoracic aneurysm repair     STERNOTOMY N/A 01/21/2017   Procedure: REDO STERNOTOMY;  Surgeon: Army Dallas NOVAK, MD;  Location: [facility] OR;  Service: Open Heart Surgery;  Laterality: N/A;   TEE WITHOUT CARDIOVERSION N/A 01/21/2017   Procedure: TRANSESOPHAGEAL ECHOCARDIOGRAM (TEE);  Surgeon: Army Dallas NOVAK, MD;  Location: [facility] OR;  Service: Open Heart Surgery;  Laterality: N/A;   THORACIC AORTIC ANEURYSM REPAIR N/A 10/06/2014   Procedure: THORACIC ASCENDING ANEURYSM REPAIR  (AAA);  Surgeon: Dallas [name] Army, MD;  Location: [facility] OR;  Service: Open Heart Surgery;  Laterality: N/A;  hyportermia circulatory arrest and resuspension of aortic valve   THORACIC AORTIC ENDOVASCULAR STENT GRAFT N/A 01/21/2017   Procedure: THORACIC AORTIC ENDOVASCULAR STENT GRAFT;  Surgeon: Serene Gaile ORN, MD;  Location: [facility];  Service: Vascular;  Laterality: N/A;   THROMBECTOMY ILIAC ARTERY Left 03/08/2017   Procedure: THROMBECTOMY of Left subclavian artery;  Surgeon: Eliza Lonni RAMAN, MD;  Location: [facility] OR;  Service: Vascular;  Laterality: Left;   VASCULAR SURGERY      MEDICATIONS:  aspirin  325 MG tablet   gabapentin  (NEURONTIN ) 300 MG capsule   lidocaine  (LIDODERM ) 5 %   metoprolol  tartrate (LOPRESSOR ) 50 MG tablet   oxyCODONE  10 MG TABS   Oxycodone  HCl 10 MG TABS   tiZANidine  (ZANAFLEX ) 4 MG tablet   tiZANidine  (ZANAFLEX ) 4 MG tablet    Isaiah Ruder, PA-C Surgical Short Stay/Anesthesiology [facility] Phone [phone] [facility] Phone [phone] 05/23/2024 4:02 PM

## 2024-05-23 NOTE — Telephone Encounter (Signed)
 FYI Only or Action Required?: FYI only for provider: ED advised.  Patient was last seen in primary care on 07/02/2021 by Purcell Emil Schanz, MD.  Called Nurse Triage reporting Cough.  Symptoms began several days ago.  Interventions attempted: Rest, hydration, or home remedies.  Symptoms are: gradually worsening.  Triage Disposition: Go to ED Now (Notify PCP)  Patient/caregiver understands and will follow disposition?: No, refuses disposition  Reason for Disposition  [1] MODERATE difficulty breathing (e.g., speaks in phrases, SOB even at rest, pulse 100-120) AND [2] still present when not coughing  Answer Assessment - Initial Assessment Questions Patient states that he has been experiencing a dry cough for about 3-4 days with runny nose. He believes he may have a cold, but he also reports having a mass on his lung. He reports moderate SOB and wheezing. ED advised, patient states he is not going to go right now. Needs to establish care with PCP, but disconnected before able to set up new patient appt. Attempted to call back with no answer-left voicemail.   1. ONSET: When did the cough begin?      A few days ago  2. SEVERITY: How bad is the cough today?      Moderate  3. SPUTUM: Describe the color of your sputum (e.g., none, dry cough; clear, white, yellow, green)     Dry cough  4. HEMOPTYSIS: Are you coughing up any blood? If Yes, ask: How much? (e.g., flecks, streaks, tablespoons, etc.)     No  5. DIFFICULTY BREATHING: Are you having difficulty breathing? If Yes, ask: How bad is it? (e.g., mild, moderate, severe)      Yes, moderate  6. FEVER: Do you have a fever? If Yes, ask: What is your temperature, how was it measured, and when did it start?     No  7. CARDIAC HISTORY: Do you have any history of heart disease? (e.g., heart attack, congestive heart failure)      Aortic dissection  8. LUNG HISTORY: Do you have any history of lung disease?  (e.g.,  pulmonary embolus, asthma, emphysema)     Mass on right lung  9. PE RISK FACTORS: Do you have a history of blood clots? (or: recent major surgery, recent prolonged travel, bedridden)     No  10. OTHER SYMPTOMS: Do you have any other symptoms? (e.g., runny nose, wheezing, chest pain)       Wheezing, chest pain but reports this has been ongoing  11. PREGNANCY: Is there any chance you are pregnant? When was your last menstrual period?       NA  12. TRAVEL: Have you traveled out of the country in the last month? (e.g., travel history, exposures)       Unknown  Protocols used: Cough - Acute Non-Productive-A-AH  Reason for Triage: Severe deep cough, shortness of breath, wheezing,   toc with [facility]

## 2024-05-24 ENCOUNTER — Inpatient Hospital Stay (HOSPITAL_COMMUNITY)

## 2024-05-24 ENCOUNTER — Encounter (HOSPITAL_COMMUNITY): Payer: Self-pay | Admitting: Internal Medicine

## 2024-05-24 DIAGNOSIS — R079 Chest pain, unspecified: Secondary | ICD-10-CM

## 2024-05-24 DIAGNOSIS — J121 Respiratory syncytial virus pneumonia: Secondary | ICD-10-CM | POA: Diagnosis present

## 2024-05-24 DIAGNOSIS — J189 Pneumonia, unspecified organism: Secondary | ICD-10-CM

## 2024-05-24 DIAGNOSIS — R918 Other nonspecific abnormal finding of lung field: Secondary | ICD-10-CM | POA: Insufficient documentation

## 2024-05-24 DIAGNOSIS — E041 Nontoxic single thyroid nodule: Secondary | ICD-10-CM

## 2024-05-24 DIAGNOSIS — I739 Peripheral vascular disease, unspecified: Secondary | ICD-10-CM

## 2024-05-24 LAB — BLOOD CULTURE ID PANEL (REFLEXED) - BCID2

## 2024-05-24 LAB — RESP PANEL BY RT-PCR (RSV, FLU A&B, COVID)  RVPGX2
Influenza A by PCR: NEGATIVE
Influenza B by PCR: NEGATIVE
Resp Syncytial Virus by PCR: POSITIVE — AB
SARS Coronavirus 2 by RT PCR: NEGATIVE

## 2024-05-24 LAB — LIPASE, BLOOD: Lipase: 16 U/L (ref 11–51)

## 2024-05-24 LAB — COMPREHENSIVE METABOLIC PANEL WITH GFR
ALT: 5 U/L (ref 0–44)
AST: 18 U/L (ref 15–41)
Albumin: 3.2 g/dL — ABNORMAL LOW (ref 3.5–5.0)
Alkaline Phosphatase: 79 U/L (ref 38–126)
Anion gap: 9 (ref 5–15)
BUN: 21 mg/dL (ref 8–23)
CO2: 23 mmol/L (ref 22–32)
Calcium: 7.9 mg/dL — ABNORMAL LOW (ref 8.9–10.3)
Chloride: 107 mmol/L (ref 98–111)
Creatinine, Ser: 0.92 mg/dL (ref 0.61–1.24)
GFR, Estimated: >60 mL/min
Glucose, Bld: 89 mg/dL (ref 70–99)
Potassium: 4.1 mmol/L (ref 3.5–5.1)
Sodium: 139 mmol/L (ref 135–145)
Total Bilirubin: 0.4 mg/dL (ref 0.0–1.2)
Total Protein: 5.9 g/dL — ABNORMAL LOW (ref 6.5–8.1)

## 2024-05-24 LAB — PROTIME-INR
INR: 1.2 (ref 0.8–1.2)
Prothrombin Time: 15.8 s — ABNORMAL HIGH (ref 11.4–15.2)

## 2024-05-24 LAB — URINALYSIS, W/ REFLEX TO CULTURE (INFECTION SUSPECTED)
Bacteria, UA: NONE SEEN
Bilirubin Urine: NEGATIVE
Glucose, UA: NEGATIVE mg/dL
Hgb urine dipstick: NEGATIVE
Ketones, ur: NEGATIVE mg/dL
Leukocytes,Ua: NEGATIVE
Nitrite: NEGATIVE
Protein, ur: NEGATIVE mg/dL
Specific Gravity, Urine: >1.046 — ABNORMAL HIGH (ref 1.005–1.030)
pH: 5 (ref 5.0–8.0)

## 2024-05-24 LAB — PROCALCITONIN: Procalcitonin: 2.08 ng/mL

## 2024-05-24 LAB — I-STAT CG4 LACTIC ACID, ED: Lactic Acid, Venous: 1.1 mmol/L (ref 0.5–1.9)

## 2024-05-24 LAB — TROPONIN T, HIGH SENSITIVITY
Troponin T High Sensitivity: 47 ng/L — ABNORMAL HIGH (ref 0–19)
Troponin T High Sensitivity: 50 ng/L — ABNORMAL HIGH (ref 0–19)

## 2024-05-24 MED ORDER — ACETAMINOPHEN 650 MG RE SUPP: 650.0000 mg | Freq: Four times a day (QID) | RECTAL | Status: DC | PRN

## 2024-05-24 MED ORDER — SODIUM CHLORIDE 0.9 % IV SOLN: 500.0000 mg | INTRAVENOUS | Status: DC

## 2024-05-24 MED ORDER — ASPIRIN 325 MG PO TABS
162.5000 mg | ORAL_TABLET | Freq: Every day | ORAL | Status: DC
  Administered 2024-05-24 – 2024-05-28 (×5): 162.5 mg via ORAL
  Filled 2024-05-24 (×5): qty 1

## 2024-05-24 MED ORDER — LIDOCAINE 5 % EX PTCH
1.0000 | MEDICATED_PATCH | CUTANEOUS | Status: DC
  Administered 2024-05-24: 1 via TRANSDERMAL
  Filled 2024-05-24 (×2): qty 1

## 2024-05-24 MED ORDER — ACETAMINOPHEN 325 MG PO TABS: 650.0000 mg | ORAL_TABLET | Freq: Four times a day (QID) | ORAL | Status: DC | PRN

## 2024-05-24 MED ORDER — GUAIFENESIN-DM 100-10 MG/5ML PO SYRP
5.0000 mL | ORAL_SOLUTION | ORAL | Status: DC | PRN
  Administered 2024-05-24 (×2): 5 mL via ORAL
  Filled 2024-05-24 (×3): qty 10

## 2024-05-24 MED ORDER — NALOXONE HCL 0.4 MG/ML IJ SOLN: 0.4000 mg | INTRAMUSCULAR | Status: DC | PRN

## 2024-05-24 MED ORDER — HYDROMORPHONE HCL 1 MG/ML IJ SOLN
0.5000 mg | INTRAMUSCULAR | Status: DC | PRN
  Administered 2024-05-24: 0.5 mg via INTRAVENOUS
  Filled 2024-05-24 (×2): qty 0.5

## 2024-05-24 MED ORDER — OXYCODONE HCL 5 MG PO TABS
5.0000 mg | ORAL_TABLET | Freq: Four times a day (QID) | ORAL | Status: DC | PRN
  Administered 2024-05-24 (×2): 10 mg via ORAL
  Filled 2024-05-24 (×2): qty 2

## 2024-05-24 MED ORDER — ENOXAPARIN SODIUM 40 MG/0.4ML IJ SOSY
40.0000 mg | PREFILLED_SYRINGE | INTRAMUSCULAR | Status: DC
  Administered 2024-05-24 – 2024-05-28 (×5): 40 mg via SUBCUTANEOUS
  Filled 2024-05-24 (×5): qty 0.4

## 2024-05-24 MED ORDER — SODIUM CHLORIDE 0.9 % IV SOLN
2.0000 g | INTRAVENOUS | Status: AC
  Administered 2024-05-24 – 2024-05-28 (×5): 2 g via INTRAVENOUS
  Filled 2024-05-24 (×5): qty 20

## 2024-05-24 MED ORDER — OXYCODONE HCL 5 MG PO TABS
10.0000 mg | ORAL_TABLET | ORAL | Status: DC | PRN
  Administered 2024-05-24 – 2024-05-28 (×15): 10 mg via ORAL
  Filled 2024-05-24 (×15): qty 2

## 2024-05-24 MED ORDER — GABAPENTIN 300 MG PO CAPS
600.0000 mg | ORAL_CAPSULE | Freq: Three times a day (TID) | ORAL | Status: DC
  Administered 2024-05-24 – 2024-05-28 (×13): 600 mg via ORAL
  Filled 2024-05-24 (×13): qty 2

## 2024-05-24 MED ORDER — SODIUM CHLORIDE 0.9 % IV SOLN: 1.0000 g | INTRAVENOUS | Status: DC

## 2024-05-24 NOTE — Telephone Encounter (Signed)
 Thank you :)

## 2024-05-24 NOTE — H&P (Signed)
 " History and Physical    Victor [name] FMW:[id number] DOB: 1955/09/19 DOA: 05/23/2024  PCP: Patient, No Pcp Per  Patient coming from: Home  Chief Complaint: Shortness of breath  HPI: Victor [name] is a 69 y.o. male with medical history significant of coronary artery calcification, aortic dissection in 2016 status post multiple surgeries, PAD status post fem-fem bypass in 2016, spinal stroke, hypertension, HFmrEF.  History of recent diagnosis of right upper lobe lung mass and recent hospital admission 1/13-1/14 for pain control when he was seen by pulmonology and initially scheduled for outpatient bronchoscopy 1/29 but was then rescheduled for 2/16 due to recent snowstorm and requiring cardiac clearance.  Patient presents to the ED tonight via EMS for evaluation of shortness of breath and severe right-sided chest pain radiating to the back.  Initially had unequal blood pressures when checked with EMS-left arm 52 over 44 mm right arm 104/62.  Patient was given a total of 900 mL IV fluids prior to arrival.  Blood pressure 92/65 on arrival to the ED and later improved after additional 1.5 L IV fluids.  EKG showing normal sinus rhythm and no acute ischemic changes.  Afebrile.  Labs showing no leukocytosis, hemoglobin 12.9, creatinine 0.92, calcium  7.9, albumin  3.2, blood cultures in process, initial lactic acid 3.0> 1.1, troponin 47 and consistent with previous values, COVID/influenza/RSV PCR pending.  CTA chest/abdomen/pelvis showing: IMPRESSION: 1. Negative for acute aortic dissection. 2. Postoperative changes of the thoracic aorta with graft repair of the ascending aorta and endovascular stent graft repair of the aortic arch and descending thoracic aorta. 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 3. 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 Continued vascular surgery follow-up recommended. 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 4. Emphysema. Interval airspace disease at the right middle lobe and progressive peribronchovascular ground-glass and mild consolidation at the right greater than left lower lobes, suspect for pneumonia. 5. Spiculated right upper lobe pulmonary mass measuring 3.5 cm, concerning for malignancy. Right lateral upper lobe pleural mass measures 2.9 x 1.1 cm, hypermetabolic on prior PET CT and consistent with malignancy. 6. Large left inguinal hernia containing mesenteric fat and small bowel, extending into the scrotum. No evidence for incarceration or obstruction. 7. Multiple thyroid nodules measuring up to 17 mm. Nonemergent thyroid ultrasound previously recommended if not already performed   Aortic Atherosclerosis (ICD10-I70.0) and Emphysema (ICD10-J43.9).  Patient was given azithromycin  and doxycycline  in the ED.  [facility] called to admit.  Patient is reporting worsening right sided chest pain radiating to his back, shortness breath, chills, and cough x 1 week.  He ran out of his home oxycodone .  No other complaints.  Denies nausea, vomiting, abdominal pain, or diarrhea.  Review of Systems:  Review of Systems  All other systems reviewed and are negative.   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   Stroke [facility])    Thoracic aortic aneurysm 09/2014    Past Surgical History:  Procedure Laterality Date   ASCENDING AORTIC ROOT REPLACEMENT N/A 01/21/2017   Procedure: Replacement of Aortic Arch with circulatory  arrest;  Surgeon: Army Dallas NOVAK, MD;  Location: [facility] OR;  Service: Open Heart Surgery;  Laterality: N/A;    CARDIAC CATHETERIZATION     FEMORAL-FEMORAL BYPASS GRAFT N/A 10/07/2014   Procedure: LEFT  FEMORAL ARTERY -RIGHT FEMORAL ARTERY BYPASS GRAFT USING X 30 CM HEMASHIELD GOLD GRAFT;  Surgeon: Krystal JULIANNA Doing, MD;  Location: [facility] OR;  Service: Vascular;  Laterality: N/A;   RIGHT/LEFT HEART CATH AND CORONARY ANGIOGRAPHY N/A 01/11/2017   Procedure: RIGHT/LEFT HEART CATH AND CORONARY ANGIOGRAPHY- Diagnostic Only;  Surgeon: Wonda Sharper, MD;  Location: [facility] INVASIVE CV LAB;  Service: Cardiovascular;  Laterality: N/A;   s/p thoracic aneurysm repair     STERNOTOMY N/A 01/21/2017   Procedure: REDO STERNOTOMY;  Surgeon: Army Dallas NOVAK, MD;  Location: [facility] OR;  Service: Open Heart Surgery;  Laterality: N/A;   TEE WITHOUT CARDIOVERSION N/A 01/21/2017   Procedure: TRANSESOPHAGEAL ECHOCARDIOGRAM (TEE);  Surgeon: Army Dallas NOVAK, MD;  Location: [facility] OR;  Service: Open Heart Surgery;  Laterality: N/A;   THORACIC AORTIC ANEURYSM REPAIR N/A 10/06/2014   Procedure: THORACIC ASCENDING ANEURYSM REPAIR (AAA);  Surgeon: Dallas NOVAK Army, MD;  Location: [facility] OR;  Service: Open Heart Surgery;  Laterality: N/A;  hyportermia circulatory arrest and resuspension of aortic valve   THORACIC AORTIC ENDOVASCULAR STENT GRAFT N/A 01/21/2017   Procedure: THORACIC AORTIC ENDOVASCULAR STENT GRAFT;  Surgeon: Serene Gaile ORN, MD;  Location: [facility] OR;  Service: Vascular;  Laterality: N/A;   THROMBECTOMY ILIAC ARTERY Left 03/08/2017   Procedure: THROMBECTOMY of Left subclavian artery;  Surgeon: Eliza Lonni RAMAN, MD;  Location: [facility] OR;  Service: Vascular;  Laterality: Left;   VASCULAR SURGERY       reports that he quit smoking about 9 years ago. His smoking use included cigarettes and cigars. He started smoking about 44 years ago. He has never used smokeless tobacco. He reports current alcohol use. He reports that he does not use drugs.  Allergies[1]  Family History  Problem Relation Age of Onset   Heart murmur Mother     Prior to  Admission medications  Medication Sig Start Date End Date Taking? Authorizing Provider  aspirin  325 MG tablet Take 0.5 tablets (162.5 mg total) by mouth daily. Patient not taking: Reported on 05/11/2024 09/06/18   Whitfield Raisin, NP  gabapentin  (NEURONTIN ) 300 MG capsule Take 2 capsules (600 mg total) by mouth 3 (three) times daily. Patient not taking: Reported on 05/11/2024 10/17/23   Sherrine Birmingham, DO  lidocaine  (LIDODERM ) 5 % Place 2 patches onto the skin daily. Remove & Discard patch within 12 hours or as directed by MD Patient not taking: Reported on 05/11/2024 05/10/24   Gherghe, Costin M, MD  metoprolol  tartrate (LOPRESSOR ) 50 MG tablet Take 1 tablet (50 mg total) by mouth once. Take 90-120 minutes prior to scan. Hold for SBP less than 110. 05/11/24 05/15/24  Monge, Damien BROCKS, NP  oxyCODONE  10 MG TABS Take 1 tablet (10 mg total) by mouth every 4 (four) hours as needed for severe pain (pain score 7-10). 05/15/24   Tegeler, Lonni PARAS, MD  Oxycodone  HCl 10 MG TABS Take 1 tablet (10 mg total) by mouth every 6 (six) hours as needed. Patient not taking: Reported on 05/11/2024 05/09/24   Trixie Nilda HERO, MD  tiZANidine  (ZANAFLEX ) 4 MG tablet Take 1 tablet (4 mg total) by mouth every 6 (six) hours as needed for muscle spasms. 05/13/24   Rogelia Jerilynn RAMAN, MD    Physical Exam: Vitals:   05/23/24  2151 05/23/24 2200 05/23/24 2315 05/24/24 0100  BP: 92/65 92/69 91/72  113/75  Pulse:  69 66 68  Resp:  16 10 10   Temp:      TempSrc:      SpO2:  96% 95% 95%    Physical Exam Vitals reviewed.  Constitutional:      General: He is not in acute distress. HENT:     Head: Normocephalic and atraumatic.  Eyes:     Extraocular Movements: Extraocular movements intact.  Cardiovascular:     Rate and Rhythm: Normal rate and regular rhythm.     Heart sounds: Normal heart sounds.  Pulmonary:     Effort: Pulmonary effort is normal. No respiratory distress.     Breath sounds: No wheezing or rales.  Abdominal:      General: Bowel sounds are normal.     Palpations: Abdomen is soft.     Tenderness: There is no abdominal tenderness. There is no guarding.  Musculoskeletal:     Cervical back: Normal range of motion.     Right lower leg: No edema.     Left lower leg: No edema.  Skin:    General: Skin is warm and dry.  Neurological:     General: No focal deficit present.     Mental Status: He is alert and oriented to person, place, and time.     Labs on Admission: I have personally reviewed following labs and imaging studies  CBC: Recent Labs  Lab 05/18/24 2047 05/23/24 2159  WBC 6.4 5.6  NEUTROABS 4.5 3.6  HGB 14.4 12.9*  HCT 44.9 41.3  MCV 88.6 91.0  PLT 354 273   Basic Metabolic Panel: Recent Labs  Lab 05/18/24 2047 05/24/24 0045  NA 140 139  K 4.6 4.1  CL 104 107  CO2 27 23  GLUCOSE 84 89  BUN 13 21  CREATININE 0.97 0.92  CALCIUM  9.4 7.9*   GFR: Estimated Creatinine Clearance: 71 mL/min (by C-G formula based on SCr of 0.92 mg/dL). Liver Function Tests: Recent Labs  Lab 05/24/24 0045  AST 18  ALT 5  ALKPHOS 79  BILITOT 0.4  PROT 5.9*  ALBUMIN  3.2*   Recent Labs  Lab 05/24/24 0045  LIPASE 16   No results for input(s): AMMONIA in the last 168 hours. Coagulation Profile: Recent Labs  Lab 05/24/24 0045  INR 1.2   Cardiac Enzymes: No results for input(s): CKTOTAL, CKMB, CKMBINDEX, TROPONINI in the last 168 hours. BNP (last 3 results) Recent Labs    04/19/24 0834 05/13/24 2033  PROBNP 161.0 300.0*   HbA1C: No results for input(s): HGBA1C in the last 72 hours. CBG: No results for input(s): GLUCAP in the last 168 hours. Lipid Profile: No results for input(s): CHOL, HDL, LDLCALC, TRIG, CHOLHDL, LDLDIRECT in the last 72 hours. Thyroid Function Tests: No results for input(s): TSH, T4TOTAL, FREET4, T3FREE, THYROIDAB in the last 72 hours. Anemia Panel: No results for input(s): VITAMINB12, FOLATE, FERRITIN,  TIBC, IRON, RETICCTPCT in the last 72 hours. Urine analysis:    Component Value Date/Time   COLORURINE YELLOW 04/21/2021 1013   APPEARANCEUR CLEAR 04/21/2021 1013   LABSPEC 1.025 04/21/2021 1013   PHURINE 6.0 04/21/2021 1013   GLUCOSEU NEGATIVE 04/21/2021 1013   HGBUR NEGATIVE 04/21/2021 1013   BILIRUBINUR NEGATIVE 04/21/2021 1013   BILIRUBINUR moderate (A) 04/25/2018 1656   KETONESUR NEGATIVE 04/21/2021 1013   PROTEINUR NEGATIVE 04/21/2021 1013   UROBILINOGEN 0.2 04/25/2018 1656   NITRITE NEGATIVE 04/21/2021 1013   LEUKOCYTESUR  NEGATIVE 04/21/2021 1013    Radiological Exams on Admission: CT Angio Chest/Abd/Pel for Dissection W and/or Wo Contrast Result Date: 05/23/2024 CLINICAL DATA:  Shortness of breath right-sided chest pain EXAM: CT ANGIOGRAPHY CHEST, ABDOMEN AND PELVIS TECHNIQUE: Non-contrast CT of the chest was initially obtained. Multidetector CT imaging through the chest, abdomen and pelvis was performed using the standard protocol during bolus administration of intravenous contrast. Multiplanar reconstructed images and MIPs were obtained and reviewed to evaluate the vascular anatomy. RADIATION DOSE REDUCTION: This exam was performed according to the departmental dose-optimization program which includes automated exposure control, adjustment of the mA and/or kV according to patient size and/or use of iterative reconstruction technique. CONTRAST:  OMNIPAQUE  IOHEXOL  350 MG/ML SOLN COMPARISON:  Chest x-ray 05/23/2024, CT 05/15/2024, 05/13/2024, PET CT 05/07/2024, 04/19/2024, 03/07/2017, 12/09/2016 FINDINGS: CTA CHEST FINDINGS Cardiovascular: Non contrasted images of the chest demonstrate no acute intramural hematoma. Postoperative appearance of the aorta with graft repair of the ascending aorta, and endovascular stent graft repair of the aortic arch and descending thoracic aorta. 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 No significant pericardial effusion Mediastinum/Nodes: Patent trachea. Multiple thyroid nodules measuring up to 17 mm. Mildly enlarged right paratracheal nodes measuring up to 15 mm as before. Esophagus within normal limits. Lungs/Pleura: Emphysema. Spiculated right upper lobe pulmonary mass measuring 3.5 cm, previously 3.5 cm. This is contiguous with bandlike scarring in the right upper lobe. Right lateral upper lobe pleural mass measures 2.9 by 1.1 cm on series 6, image 73, hypermetabolic on prior PET CT. Interval airspace disease at the right middle lobe and progressive peribronchovascular ground-glass and mild consolidation at the right greater than left lower lobes. No pleural effusion or pneumothorax. Musculoskeletal: No acute or suspicious osseous abnormality. Sternotomy. Mild chronic superior endplate deformities at T11 and T12 Review of the MIP images confirms the above findings. CTA ABDOMEN AND PELVIS FINDINGS VASCULAR Aorta: Chronic dissection of the abdominal aorta as before, this terminates at the distal infrarenal abdominal aorta, just proximal to the IMA origin. Aneurysmal dilatation of the proximal abdominal aorta at the distal portion of the aortic stent and slightly distal to with, this measures 5.7 x 5 cm on series 9, image 133, previously 5.8 cm. This has increased compared to prior exams from 2018. 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 No aneurysm. There is mild to moderate stenosis slightly distal to the origin with wide patency distally SMA: Patent without evidence of aneurysm, dissection, vasculitis or significant stenosis. Arises from the true lumen Renals: Fenestration at the level of the renal arteries providing cross flow. Right  renal artery arises from the false lumen, main left renal artery arises from the true lumen. Both arteries are patent and without stenosis or aneurysm. Distal left accessory lower pole artery arising from the distal aorta is patent. IMA: Patent without evidence of aneurysm, dissection, vasculitis or significant stenosis. Inflow: Atherosclerosis. No aneurysm or dissection. Chronically occluded femoral femoral bypass graft. Aneurysmal dilatation of the right greater than left common femoral artery, measures 2.4 cm on the right and 2.3 cm on the left. Veins: Suboptimally assessed Review of the MIP images confirms the above findings. NON-VASCULAR Hepatobiliary: No focal liver abnormality is seen. No gallstones, gallbladder wall thickening, or biliary dilatation. Pancreas:  Unremarkable. No pancreatic ductal dilatation or surrounding inflammatory changes. Spleen: Normal in size without focal abnormality. Adrenals/Urinary Tract: Adrenal glands are normal. No hydronephrosis. The bladder is unremarkable Stomach/Bowel: Stomach within normal limits. No dilated small bowel. No acute bowel wall thickening. Lymphatic: No suspicious lymph nodes Reproductive: Negative for mass Other: . No ascites or free air. Large left inguinal hernia containing mesenteric fat and small bowel, extending into the scrotum. No evidence for and incarceration or obstruction Musculoskeletal: Chronic compression deformity at L1 Review of the MIP images confirms the above findings. IMPRESSION: 1. Negative for acute aortic dissection. 2. Postoperative changes of the thoracic aorta with graft repair of the ascending aorta and endovascular stent graft repair of the aortic arch and descending thoracic aorta. 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 3. 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 Continued vascular surgery follow-up recommended. 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 4. Emphysema. Interval airspace disease at the right middle lobe and progressive peribronchovascular ground-glass and mild consolidation at the right greater than left lower lobes, suspect for pneumonia. 5. Spiculated right upper lobe pulmonary mass measuring 3.5 cm, concerning for malignancy. Right lateral upper lobe pleural mass measures 2.9 x 1.1 cm, hypermetabolic on prior PET CT and consistent with malignancy. 6. Large left inguinal hernia containing mesenteric fat and small bowel, extending into the scrotum. No evidence for incarceration or obstruction. 7. Multiple thyroid nodules measuring up to 17 mm. Nonemergent thyroid ultrasound previously recommended if not already performed Aortic Atherosclerosis (ICD10-I70.0) and Emphysema (ICD10-J43.9). Electronically Signed   By: Luke Bun M.D.   On: 05/23/2024 23:38   DG Chest Port 1 View if patient is in a treatment room. Result Date: 05/23/2024 EXAM: 1 VIEW(S) XRAY OF THE CHEST 05/23/2024 10:18:30 PM COMPARISON: 05/18/2024 CLINICAL HISTORY: Suspected sepsis. FINDINGS: LUNGS AND PLEURA: Lower lung volumes compared to prior exam. Opacity at lung bases. Small right pleural effusion. Stable 4 cm right upper lobe mass. Stable pleural thickening along right lateral chest. No pneumothorax. HEART AND MEDIASTINUM: Median sternotomy and thoracic aortic stent graft noted. No acute abnormality of the cardiac and mediastinal silhouettes. BONES AND SOFT TISSUES: No acute osseous abnormality. IMPRESSION: 1. Low lung volumes with opacity at the lung bases and a small right pleural effusion. 2. Stable 4 cm right upper lobe mass. Electronically signed by: Elsie Gravely MD 05/23/2024 10:25 PM EST RP  Workstation: [id number]    Assessment and Plan  RSV pneumonia Presenting with shortness of breath and cough.  No fever, tachycardia, tachypnea, or leukocytosis.  Not hypoxic.  Lactic acidosis resolved and blood pressure improved after IV fluids.  No signs of sepsis at this time.  RSV PCR positive.  CT showing interval airspace disease at the right middle lobe and progressive peribronchovascular ground glass and mild consolidation at the right greater than left lower lobes suspicious for pneumonia.  Continue supportive care.  Bacterial pneumonia less likely, will hold additional antibiotics at this time and check procalcitonin level.  Follow-up blood cultures.  Droplet and contact precautions.  Right upper lobe lung mass concerning for malignancy Followed by pulmonology and scheduled for outpatient bronchoscopy on 2/16 after cardiac clearance.  Right-sided chest pain Likely related to problems  listed above.  CTA negative for acute aortic dissection.  EKG without acute ischemic changes.  Troponin mildly elevated and consistent with previous values, continue to trend.  Continue pain management: Oxy IR PRN, Tylenol  PRN, and lidocaine  patch.  Continue home gabapentin .  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  Patient has not seen vascular surgery for several years and will need close follow-up.  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  Multiple thyroid nodules Seen on CT and thyroid ultrasound ordered for further evaluation.  Chronic HFmrEF Last echo done in September 2018 showing EF 45 to 50%.  No signs of volume overload at this time.  DVT prophylaxis: Lovenox  Code Status: Full Code (discussed with the  patient) Level of care: Progressive Care Unit Admission status: It is my clinical opinion that referral for OBSERVATION is reasonable and necessary in this patient based on the above information provided. The aforementioned taken together are felt to place the patient at high risk for further clinical deterioration. However, it is anticipated that the patient may be medically stable for discharge from the hospital within 24 to 48 hours.  Editha Ram MD [facility]  If 7PM-7AM, please contact night-coverage [email or web address]  05/24/2024, 1:42 AM       [1] No Known Allergies  "

## 2024-05-24 NOTE — Progress Notes (Signed)
 " PROGRESS NOTE    [name]  FMW:[id number] DOB: 1956-01-29 DOA: 05/23/2024 PCP: Patient, No Pcp Per   Brief Narrative:   Victor [name] is a 69 y.o. male with medical history significant of coronary artery calcification, aortic dissection in 2016 status post multiple surgeries, PAD status post fem-fem bypass in 2016, spinal stroke, hypertension, HFmrEF.  History of recent diagnosis of right upper lobe lung mass, evaluated pulmonary and plan for outpatient bronchoscopy pending cardiac clearance.  Patient presented to the ER for evaluation of shortness of breath and severe right-sided chest pain radiating to back.    Initial blood pressure 92 over 65 mm on arrival later improved with fluid bolus.  EKG showing normal sinus rhythm and no acute ischemic changes.  Afebrile.  Labs showing no leukocytosis, hemoglobin 12.9, creatinine 0.92, calcium  7.9, albumin  3.2, blood cultures in process, initial lactic acid 3.0> 1.1, troponin 47 and consistent with previous values, COVID/influenza negative, RSV positive.  CT chest abdomen and pelvis is negative for acute aortic dissection.  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  Stable.   Interval airspace disease at the right middle lobe and progressive bronchovascular ground glass and mild consolidation in the right greater than left lower lobe suspicious for pneumonia.  Spiculated right upper lobe mass.  Patient admitted for further management.   Assessment & Plan:   Principal Problem:   CAP (community acquired pneumonia) Active Problems:   Lung mass   Right-sided chest pain   Thyroid nodule   PAD (peripheral artery disease)   RSV (respiratory syncytial virus pneumonia)    RSV pneumonia, possible superimposed bacterial pneumonia Blood pressure was soft on presentation requiring fluid bolus.  Also lactic acid was elevated 3.0 improved to 1.1 after fluid bolus. CT  shows findings concerning for pneumonia along with known lung mass. proCalcitonin is elevated at 2.0.  Will initiate IV ceftriaxone  Supportive care Patient is not hypoxic. 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   Right-sided chest pain Likely related to problems listed above.  CTA negative for acute aortic dissection.  EKG without acute ischemic changes.  Troponin mildly elevated and consistent with previous values, continue to trend.  Continue pain management: Oxy IR PRN, Tylenol  PRN, and lidocaine  patch.  Continue home gabapentin .   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  Patient has not seen vascular surgery for several years and will need close follow-up.   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   Multiple thyroid nodules Seen on CT and will follow-up with thyroid ultrasound ,   Chronic HFmrEF Last echo done in September 2018 showing EF 45 to 50%.  No signs of volume overload at this time.   DVT prophylaxis: Lovenox  Code Status: Full Code (discussed with the patient) Level of care: Progressive Care Unit Admission status: Inpatient.  Subjective:  Patient seen and examined at the bedside.  He says he feels better.  He still has some cough.  No fever or chills.  Blood pressure improved.  Denies nausea vomiting.  Denies chest pain today  Objective: Vitals:  05/24/24 0524 05/24/24 0615 05/24/24 0934 05/24/24 1344  BP:  97/67 105/78 104/72  Pulse:  84 79 81  Resp:  17 17 18   Temp: 98 F (36.7 C)  98.4 F (36.9 C) 98.3 F (36.8 C)  TempSrc:  Oral  Oral   SpO2:  95% 97% 95%   No intake or output data in the 24 hours ending 05/24/24 1500 There were no vitals filed for this visit.  Examination:  General exam: Appears calm and comfortable  Respiratory system: Diminished at bases, right upper lung crackles Cardiovascular system: S1 & S2 heard, Rate controlled Gastrointestinal system: Abdomen is nondistended, soft and nontender. Normal bowel sounds heard. Extremities: No cyanosis, clubbing, edema  Central nervous system: Alert and oriented. No focal neurological deficits. Moving extremities Skin: No rashes, lesions or ulcers Psychiatry: Judgement and insight appear normal. Mood & affect appropriate.     Data Reviewed: I have personally reviewed following labs and imaging studies  CBC: Recent Labs  Lab 05/18/24 2047 05/23/24 2159  WBC 6.4 5.6  NEUTROABS 4.5 3.6  HGB 14.4 12.9*  HCT 44.9 41.3  MCV 88.6 91.0  PLT 354 273   Basic Metabolic Panel: Recent Labs  Lab 05/18/24 2047 05/24/24 0045  NA 140 139  K 4.6 4.1  CL 104 107  CO2 27 23  GLUCOSE 84 89  BUN 13 21  CREATININE 0.97 0.92  CALCIUM  9.4 7.9*   GFR: Estimated Creatinine Clearance: 71 mL/min (by C-G formula based on SCr of 0.92 mg/dL). Liver Function Tests: Recent Labs  Lab 05/24/24 0045  AST 18  ALT 5  ALKPHOS 79  BILITOT 0.4  PROT 5.9*  ALBUMIN  3.2*   Recent Labs  Lab 05/24/24 0045  LIPASE 16   No results for input(s): AMMONIA in the last 168 hours. Coagulation Profile: Recent Labs  Lab 05/24/24 0045  INR 1.2   Cardiac Enzymes: No results for input(s): CKTOTAL, CKMB, CKMBINDEX, TROPONINI in the last 168 hours. BNP (last 3 results) Recent Labs    04/19/24 0834 05/13/24 2033  PROBNP 161.0 300.0*   HbA1C: No results for input(s): HGBA1C in the last 72 hours. CBG: No results for input(s): GLUCAP in the last 168 hours. Lipid Profile: No results for input(s): CHOL, HDL, LDLCALC, TRIG, CHOLHDL,  LDLDIRECT in the last 72 hours. Thyroid Function Tests: No results for input(s): TSH, T4TOTAL, FREET4, T3FREE, THYROIDAB in the last 72 hours. Anemia Panel: No results for input(s): VITAMINB12, FOLATE, FERRITIN, TIBC, IRON, RETICCTPCT in the last 72 hours. Sepsis Labs: Recent Labs  Lab 05/23/24 2329 05/24/24 0055 05/24/24 0320  PROCALCITON  --   --  2.08  LATICACIDVEN 3.0* 1.1  --     Recent Results (from the past 240 hours)  Resp panel by RT-PCR (RSV, Flu A&B, Covid) Anterior Nasal Swab     Status: Abnormal   Collection Time: 05/24/24  1:38 AM   Specimen: Anterior Nasal Swab  Result Value Ref Range Status   SARS Coronavirus 2 by RT PCR NEGATIVE NEGATIVE Final    Comment: (NOTE) SARS-CoV-2 target nucleic acids are NOT DETECTED.  The SARS-CoV-2 RNA is generally detectable in upper respiratory specimens during the acute phase of infection. The lowest concentration of SARS-CoV-2 viral copies this assay can detect is 138 copies/mL. A negative result does not preclude SARS-Cov-2 infection and should not be used as the sole basis for treatment or other patient management decisions. A negative result may occur with  improper specimen collection/handling, submission of specimen other than  nasopharyngeal swab, presence of viral mutation(s) within the areas targeted by this assay, and inadequate number of viral copies(<138 copies/mL). A negative result must be combined with clinical observations, patient history, and epidemiological information. The expected result is Negative.  Fact Sheet for Patients:  bloggercourse.com  Fact Sheet for Healthcare Providers:  seriousbroker.it  This test is no t yet approved or cleared by the [place]  FDA and  has been authorized for detection and/or diagnosis of SARS-CoV-2 by FDA under an Emergency Use Authorization (EUA). This EUA will remain  in effect (meaning this  test can be used) for the duration of the COVID-19 declaration under Section 564(b)(1) of the Act, 21 U.S.C.section 360bbb-3(b)(1), unless the authorization is terminated  or revoked sooner.       Influenza A by PCR NEGATIVE NEGATIVE Final   Influenza B by PCR NEGATIVE NEGATIVE Final    Comment: (NOTE) The Xpert Xpress SARS-CoV-2/FLU/RSV plus assay is intended as an aid in the diagnosis of influenza from Nasopharyngeal swab specimens and should not be used as a sole basis for treatment. Nasal washings and aspirates are unacceptable for Xpert Xpress SARS-CoV-2/FLU/RSV testing.  Fact Sheet for Patients: bloggercourse.com  Fact Sheet for Healthcare Providers: seriousbroker.it  This test is not yet approved or cleared by the [place]  FDA and has been authorized for detection and/or diagnosis of SARS-CoV-2 by FDA under an Emergency Use Authorization (EUA). This EUA will remain in effect (meaning this test can be used) for the duration of the COVID-19 declaration under Section 564(b)(1) of the Act, 21 U.S.C. section 360bbb-3(b)(1), unless the authorization is terminated or revoked.     Resp Syncytial Virus by PCR POSITIVE (A) NEGATIVE Final    Comment: (NOTE) Fact Sheet for Patients: bloggercourse.com  Fact Sheet for Healthcare Providers: seriousbroker.it  This test is not yet approved or cleared by the [place]  FDA and has been authorized for detection and/or diagnosis of SARS-CoV-2 by FDA under an Emergency Use Authorization (EUA). This EUA will remain in effect (meaning this test can be used) for the duration of the COVID-19 declaration under Section 564(b)(1) of the Act, 21 U.S.C. section 360bbb-3(b)(1), unless the authorization is terminated or revoked.  Performed at [facility], [address] [place], [place] [address]           Radiology Studies: CT Angio Chest/Abd/Pel for Dissection W and/or Wo Contrast Result Date: 05/23/2024 CLINICAL DATA:  Shortness of breath right-sided chest pain EXAM: CT ANGIOGRAPHY CHEST, ABDOMEN AND PELVIS TECHNIQUE: Non-contrast CT of the chest was initially obtained. Multidetector CT imaging through the chest, abdomen and pelvis was performed using the standard protocol during bolus administration of intravenous contrast. Multiplanar reconstructed images and MIPs were obtained and reviewed to evaluate the vascular anatomy. RADIATION DOSE REDUCTION: This exam was performed according to the departmental dose-optimization program which includes automated exposure control, adjustment of the mA and/or kV according to patient size and/or use of iterative reconstruction technique. CONTRAST:  OMNIPAQUE  IOHEXOL  350 MG/ML SOLN COMPARISON:  Chest x-ray 05/23/2024, CT 05/15/2024, 05/13/2024, PET CT 05/07/2024, 04/19/2024, 03/07/2017, 12/09/2016 FINDINGS: CTA CHEST FINDINGS Cardiovascular: Non contrasted images of the chest demonstrate no acute intramural hematoma. Postoperative appearance of the aorta with graft repair of the ascending aorta, and endovascular stent graft repair of the aortic arch and descending thoracic aorta. 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 Stable aneurysmal dilatation of proximal descending  thoracic aorta measuring up to 4.1 cm mild cardiomegaly. No significant pericardial effusion Mediastinum/Nodes: Patent trachea. Multiple thyroid nodules measuring up to 17 mm. Mildly enlarged right paratracheal nodes measuring up to 15 mm as before. Esophagus within normal limits. Lungs/Pleura: Emphysema. Spiculated right upper lobe pulmonary mass measuring 3.5 cm, previously 3.5 cm. This is contiguous with bandlike scarring in the right upper lobe. Right  lateral upper lobe pleural mass measures 2.9 by 1.1 cm on series 6, image 73, hypermetabolic on prior PET CT. Interval airspace disease at the right middle lobe and progressive peribronchovascular ground-glass and mild consolidation at the right greater than left lower lobes. No pleural effusion or pneumothorax. Musculoskeletal: No acute or suspicious osseous abnormality. Sternotomy. Mild chronic superior endplate deformities at T11 and T12 Review of the MIP images confirms the above findings. CTA ABDOMEN AND PELVIS FINDINGS VASCULAR Aorta: Chronic dissection of the abdominal aorta as before, this terminates at the distal infrarenal abdominal aorta, just proximal to the IMA origin. Aneurysmal dilatation of the proximal abdominal aorta at the distal portion of the aortic stent and slightly distal to with, this measures 5.7 x 5 cm on series 9, image 133, previously 5.8 cm. This has increased compared to prior exams from 2018. 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 No aneurysm. There is mild to moderate stenosis slightly distal to the origin with wide patency distally SMA: Patent without evidence of aneurysm, dissection, vasculitis or significant stenosis. Arises from the true lumen Renals: Fenestration at the level of the renal arteries providing cross flow. Right renal artery arises from the false lumen, main left renal artery arises from the true lumen. Both arteries are patent and without stenosis or aneurysm. Distal left accessory lower pole artery arising from the distal aorta is patent. IMA: Patent without evidence of aneurysm, dissection, vasculitis or significant stenosis. Inflow: Atherosclerosis. No aneurysm or dissection. Chronically occluded femoral femoral bypass graft. Aneurysmal dilatation of the right greater than left common femoral artery, measures 2.4 cm on the right and 2.3 cm on the left. Veins: Suboptimally assessed  Review of the MIP images confirms the above findings. NON-VASCULAR Hepatobiliary: No focal liver abnormality is seen. No gallstones, gallbladder wall thickening, or biliary dilatation. Pancreas: Unremarkable. No pancreatic ductal dilatation or surrounding inflammatory changes. Spleen: Normal in size without focal abnormality. Adrenals/Urinary Tract: Adrenal glands are normal. No hydronephrosis. The bladder is unremarkable Stomach/Bowel: Stomach within normal limits. No dilated small bowel. No acute bowel wall thickening. Lymphatic: No suspicious lymph nodes Reproductive: Negative for mass Other: . No ascites or free air. Large left inguinal hernia containing mesenteric fat and small bowel, extending into the scrotum. No evidence for and incarceration or obstruction Musculoskeletal: Chronic compression deformity at L1 Review of the MIP images confirms the above findings. IMPRESSION: 1. Negative for acute aortic dissection. 2. Postoperative changes of the thoracic aorta with graft repair of the ascending aorta and endovascular stent graft repair of the aortic arch and descending thoracic aorta. 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 3. 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 Continued vascular surgery follow-up recommended. 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 4. Emphysema. Interval  airspace disease at the right middle lobe and progressive peribronchovascular ground-glass and mild consolidation at the right greater than left lower lobes, suspect for pneumonia. 5. Spiculated right upper lobe pulmonary mass measuring 3.5 cm, concerning for malignancy. Right lateral upper  lobe pleural mass measures 2.9 x 1.1 cm, hypermetabolic on prior PET CT and consistent with malignancy. 6. Large left inguinal hernia containing mesenteric fat and small bowel, extending into the scrotum. No evidence for incarceration or obstruction. 7. Multiple thyroid nodules measuring up to 17 mm. Nonemergent thyroid ultrasound previously recommended if not already performed Aortic Atherosclerosis (ICD10-I70.0) and Emphysema (ICD10-J43.9). Electronically Signed   By: Luke Bun M.D.   On: 05/23/2024 23:38   DG Chest Port 1 View if patient is in a treatment room. Result Date: 05/23/2024 EXAM: 1 VIEW(S) XRAY OF THE CHEST 05/23/2024 10:18:30 PM COMPARISON: 05/18/2024 CLINICAL HISTORY: Suspected sepsis. FINDINGS: LUNGS AND PLEURA: Lower lung volumes compared to prior exam. Opacity at lung bases. Small right pleural effusion. Stable 4 cm right upper lobe mass. Stable pleural thickening along right lateral chest. No pneumothorax. HEART AND MEDIASTINUM: Median sternotomy and thoracic aortic stent graft noted. No acute abnormality of the cardiac and mediastinal silhouettes. BONES AND SOFT TISSUES: No acute osseous abnormality. IMPRESSION: 1. Low lung volumes with opacity at the lung bases and a small right pleural effusion. 2. Stable 4 cm right upper lobe mass. Electronically signed by: Elsie Gravely MD 05/23/2024 10:25 PM EST RP Workstation: [id number]        Scheduled Meds:  aspirin   162.5 mg Oral Daily   enoxaparin  (LOVENOX ) injection  40 mg Subcutaneous Q24H   gabapentin   600 mg Oral TID   lidocaine   1 patch Transdermal Q24H   Continuous Infusions:  cefTRIAXone  (ROCEPHIN )  IV 2 g (05/24/24 1022)          Derryl Duval, MD [facility] 05/24/2024, 3:00 PM   "

## 2024-05-24 NOTE — Progress Notes (Signed)
 PHARMACY - PHYSICIAN COMMUNICATION CRITICAL VALUE ALERT - BLOOD CULTURE IDENTIFICATION (BCID)  Victor [name] is an 69 y.o. male who presented to [facility] on 05/23/2024 with a chief complaint of SOB, chest pain  Assessment:  2/2 BCx (2/4 bottles) growing Strep spp (possible pulm source?)  Name of physician (or Provider) ContactedBETHA Horns, NP  Current antibiotics: Rocephin  2g IV q24  Changes to prescribed antibiotics recommended:  Patient is on recommended antibiotics - No changes needed  Results for orders placed or performed during the hospital encounter of 05/23/24  Blood Culture ID Panel (Reflexed) (Collected: 05/23/2024 11:18 PM)  Result Value Ref Range   Enterococcus faecalis NOT DETECTED NOT DETECTED   Enterococcus Faecium NOT DETECTED NOT DETECTED   Listeria monocytogenes NOT DETECTED NOT DETECTED   Staphylococcus species NOT DETECTED NOT DETECTED   Staphylococcus aureus (BCID) NOT DETECTED NOT DETECTED   Staphylococcus epidermidis NOT DETECTED NOT DETECTED   Staphylococcus lugdunensis NOT DETECTED NOT DETECTED   Streptococcus species DETECTED (A) NOT DETECTED   Streptococcus agalactiae NOT DETECTED NOT DETECTED   Streptococcus pneumoniae NOT DETECTED NOT DETECTED   Streptococcus pyogenes NOT DETECTED NOT DETECTED   A.calcoaceticus-baumannii NOT DETECTED NOT DETECTED   Bacteroides fragilis NOT DETECTED NOT DETECTED   Enterobacterales NOT DETECTED NOT DETECTED   Enterobacter cloacae complex NOT DETECTED NOT DETECTED   Escherichia coli NOT DETECTED NOT DETECTED   Klebsiella aerogenes NOT DETECTED NOT DETECTED   Klebsiella oxytoca NOT DETECTED NOT DETECTED   Klebsiella pneumoniae NOT DETECTED NOT DETECTED   Proteus species NOT DETECTED NOT DETECTED   Salmonella species NOT DETECTED NOT DETECTED   Serratia marcescens NOT DETECTED NOT DETECTED   Haemophilus influenzae NOT DETECTED NOT DETECTED   Neisseria meningitidis NOT DETECTED NOT DETECTED   Pseudomonas aeruginosa NOT  DETECTED NOT DETECTED   Stenotrophomonas maltophilia NOT DETECTED NOT DETECTED   Candida albicans NOT DETECTED NOT DETECTED   Candida auris NOT DETECTED NOT DETECTED   Candida glabrata NOT DETECTED NOT DETECTED   Candida krusei NOT DETECTED NOT DETECTED   Candida parapsilosis NOT DETECTED NOT DETECTED   Candida tropicalis NOT DETECTED NOT DETECTED   Cryptococcus neoformans/gattii NOT DETECTED NOT DETECTED    [name] A 05/24/2024  8:43 PM

## 2024-05-25 MED ORDER — HYDROCODONE BIT-HOMATROP MBR 5-1.5 MG/5ML PO SOLN
5.0000 mL | ORAL | Status: AC | PRN
  Administered 2024-05-25 (×2): 5 mL via ORAL
  Filled 2024-05-25 (×2): qty 5

## 2024-05-25 MED ORDER — HYDROCODONE BIT-HOMATROP MBR 5-1.5 MG/5ML PO SOLN: 5.0000 mL | Freq: Four times a day (QID) | ORAL | Status: DC | PRN

## 2024-05-25 NOTE — Telephone Encounter (Signed)
 Lmom to discuss lab & carotid results. Pt advised to call back if he isn't able to review results on mychart.

## 2024-05-25 NOTE — Progress Notes (Signed)
 " PROGRESS NOTE    Victor [name]  FMW:[id number] DOB: 10-Jan-1956 DOA: 05/23/2024 PCP: Patient, No Pcp Per   Brief Narrative:   Victor [name] is a 69 y.o. male with medical history significant of coronary artery calcification, aortic dissection in 2016 status post multiple surgeries, PAD status post fem-fem bypass in 2016, spinal stroke, hypertension, HFmrEF.  History of recent diagnosis of right upper lobe lung mass, evaluated pulmonary and plan for outpatient bronchoscopy pending cardiac clearance.  Patient presented to the ER for evaluation of shortness of breath and severe right-sided chest pain radiating to back.    Initial blood pressure 92 over 65 mm on arrival later improved with fluid bolus.  EKG showing normal sinus rhythm and no acute ischemic changes.  Afebrile.  Labs showing no leukocytosis, hemoglobin 12.9, creatinine 0.92, calcium  7.9, albumin  3.2, blood cultures in process, initial lactic acid 3.0> 1.1, troponin 47 and consistent with previous values, COVID/influenza negative, RSV positive.  CT chest abdomen and pelvis is negative for acute aortic dissection.  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  Stable.   Interval airspace disease at the right middle lobe and progressive bronchovascular ground glass and mild consolidation in the right greater than left lower lobe suspicious for pneumonia.  Spiculated right upper lobe mass.  Patient admitted for further management.   Assessment & Plan:   Principal Problem:   CAP (community acquired pneumonia) Active Problems:   Lung mass   Right-sided chest pain   Thyroid nodule   PAD (peripheral artery disease)   RSV (respiratory syncytial virus pneumonia)    RSV pneumonia, possible superimposed bacterial pneumonia Blood pressure was soft on presentation requiring fluid bolus.  Also lactic acid was elevated 3.0 improved to 1.1 after fluid bolus. CT  shows findings concerning for pneumonia along with known lung mass. proCalcitonin is elevated at 2.0.   Rocephin  started 1/29 - supportive care Patient is not hypoxic. Blood culture positive for multiple Streptococcus species.  Will consult ID for input.  Right upper lobe lung mass concerning for malignancy Followed by pulmonology outpt and scheduled for outpatient bronchoscopy on 2/16 after cardiac clearance.   Right-sided chest pain Likely related to problems listed above.  CTA negative for acute aortic dissection.  EKG without acute ischemic changes.  Troponin mildly elevated and consistent with previous values, continue to trend.  Continue pain management: Oxy IR PRN, Tylenol  PRN, and lidocaine  patch.  Continue home gabapentin .   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  Patient has not seen vascular surgery for several years and will need close follow-up.   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   Multiple thyroid nodules Seen on CT and will follow-up with thyroid ultrasound ,   Chronic HFmrEF Last echo done in September 2018 showing EF 45 to 50%.  No signs of volume overload at this time.   DVT prophylaxis: Lovenox  Code Status: Full Code (discussed with the patient) Level of care: Progressive Care Unit Admission status: Inpatient.  Subjective:  Patient seen and examined at the bedside.  He continues to have cough.  No fever or chills.  Feels hungry.  Vital signs are stable.  Objective: Vitals:  05/24/24 2008 05/25/24 0413 05/25/24 0841 05/25/24 1348  BP: 107/79 (!) 158/98  130/82  Pulse: 83 86  88  Resp: 16 16  16   Temp: 98.3 F (36.8 C) 98.5 F (36.9 C)  98.7 F (37.1 C)  TempSrc:  Oral Oral  Oral  SpO2: 98% 98%  97%  Weight:   64.5 kg   Height:   5' 7 (1.702 m)     Intake/Output Summary (Last 24 hours) at 05/25/2024 1607 Last data filed at 05/25/2024 1408 Gross per 24 hour  Intake 840 ml  Output 900 ml  Net -60 ml   Filed Weights   05/25/24 0841  Weight: 64.5 kg    Examination:  General: Alert, oriented HEENT: Moist oral mucosa Chest: Bibasilar crackles, no wheezing CVs: S1, S2, no murmur, regular rhythm Abdomen: Soft, nontender, no organomegaly  Data Reviewed: I have personally reviewed following labs and imaging studies  CBC: Recent Labs  Lab 05/18/24 2047 05/23/24 2159  WBC 6.4 5.6  NEUTROABS 4.5 3.6  HGB 14.4 12.9*  HCT 44.9 41.3  MCV 88.6 91.0  PLT 354 273   Basic Metabolic Panel: Recent Labs  Lab 05/18/24 2047 05/24/24 0045  NA 140 139  K 4.6 4.1  CL 104 107  CO2 27 23  GLUCOSE 84 89  BUN 13 21  CREATININE 0.97 0.92  CALCIUM  9.4 7.9*   GFR: Estimated Creatinine Clearance: 71.1 mL/min (by C-G formula based on SCr of 0.92 mg/dL). Liver Function Tests: Recent Labs  Lab 05/24/24 0045  AST 18  ALT 5  ALKPHOS 79  BILITOT 0.4  PROT 5.9*  ALBUMIN  3.2*   Recent Labs  Lab 05/24/24 0045  LIPASE 16   No results for input(s): AMMONIA in the last 168 hours. Coagulation Profile: Recent Labs  Lab 05/24/24 0045  INR 1.2   Cardiac Enzymes: No results for input(s): CKTOTAL, CKMB, CKMBINDEX, TROPONINI in the last 168 hours. BNP (last 3 results) Recent Labs    04/19/24 0834 05/13/24 2033  PROBNP 161.0 300.0*   HbA1C: No results for input(s): HGBA1C in the last 72 hours. CBG: No results for input(s): GLUCAP in the last 168 hours. Lipid Profile: No results for input(s): CHOL, HDL, LDLCALC, TRIG, CHOLHDL, LDLDIRECT in the last 72 hours. Thyroid Function Tests: No results for input(s): TSH, T4TOTAL, FREET4, T3FREE, THYROIDAB in the last 72 hours. Anemia Panel: No results for  input(s): VITAMINB12, FOLATE, FERRITIN, TIBC, IRON, RETICCTPCT in the last 72 hours. Sepsis Labs: Recent Labs  Lab 05/23/24 2329 05/24/24 0055 05/24/24 0320  PROCALCITON  --   --  2.08  LATICACIDVEN 3.0* 1.1  --     Recent Results (from the past 240 hours)  Blood Culture (routine x 2)     Status: Abnormal (Preliminary result)   Collection Time: 05/23/24 11:16 PM   Specimen: BLOOD  Result Value Ref Range Status   Specimen Description   Final    BLOOD BLOOD RIGHT ARM Performed at [facility], [address], [place], [place] [address]    Special Requests   Final    BOTTLES DRAWN AEROBIC AND ANAEROBIC Blood Culture adequate volume Performed at [facility], [address], [place], [place] [address]    Culture  Setup Time   Final    GRAM POSITIVE COCCI IN CHAINS IN BOTH AEROBIC AND ANAEROBIC BOTTLES CRITICAL RESULT CALLED TO, READ BACK BY AND VERIFIED WITH: PHARMD DSABRA JEANS [id number] @ 2041 FH    Culture (A)  Final    STREPTOCOCCUS CRISTATUS THE SIGNIFICANCE OF ISOLATING THIS ORGANISM FROM A SINGLE SET OF BLOOD CULTURES WHEN MULTIPLE SETS ARE DRAWN IS UNCERTAIN. PLEASE NOTIFY THE MICROBIOLOGY DEPARTMENT WITHIN ONE WEEK IF SPECIATION AND SENSITIVITIES ARE REQUIRED. Performed at [facility], [address], [place], [place] [address]    Report Status PENDING  Incomplete  Blood Culture (routine x 2)     Status: Abnormal (Preliminary result)   Collection Time: 05/23/24 11:18 PM   Specimen: BLOOD LEFT ARM  Result Value Ref Range Status   Specimen Description   Final    BLOOD LEFT ARM Performed at [facility], [address] [place], [place] [address]    Special Requests   Final    BOTTLES DRAWN AEROBIC AND ANAEROBIC Blood Culture adequate volume Performed at [facility], [address], [place], [place] [address]    Culture  Setup Time   Final    [facility] POSITIVE COCCI ANAEROBIC BOTTLE  ONLY PHARMD D. WOFFORD [id number] @ 2041 FH    Culture (A)  Final    STREPTOCOCCUS MITIS/ORALIS THE SIGNIFICANCE OF ISOLATING THIS ORGANISM FROM A SINGLE SET OF BLOOD CULTURES WHEN MULTIPLE SETS ARE DRAWN IS UNCERTAIN. PLEASE NOTIFY THE MICROBIOLOGY DEPARTMENT WITHIN ONE WEEK IF SPECIATION AND SENSITIVITIES ARE REQUIRED. Performed at [facility], [address], [place], [place] [address]    Report Status PENDING  Incomplete  Blood Culture ID Panel (Reflexed)     Status: Abnormal   Collection Time: 05/23/24 11:18 PM  Result Value Ref Range Status   Enterococcus faecalis NOT DETECTED NOT DETECTED Final   Enterococcus Faecium NOT DETECTED NOT DETECTED Final   Listeria monocytogenes NOT DETECTED NOT DETECTED Final   Staphylococcus species NOT DETECTED NOT DETECTED Final   Staphylococcus aureus (BCID) NOT DETECTED NOT DETECTED Final   Staphylococcus epidermidis NOT DETECTED NOT DETECTED Final   Staphylococcus lugdunensis NOT DETECTED NOT DETECTED Final   Streptococcus species DETECTED (A) NOT DETECTED Final    Comment: Not Enterococcus species, Streptococcus agalactiae, Streptococcus pyogenes, or Streptococcus pneumoniae. CRITICAL RESULT CALLED TO, READ BACK BY AND VERIFIED WITH: PHARMD D. TNQQNMI [id number] @ 2041 FH    Streptococcus agalactiae NOT DETECTED NOT DETECTED Final   Streptococcus pneumoniae NOT DETECTED NOT DETECTED Final   Streptococcus pyogenes NOT DETECTED NOT DETECTED Final   A.calcoaceticus-baumannii NOT DETECTED NOT DETECTED Final   Bacteroides fragilis NOT DETECTED NOT DETECTED Final   Enterobacterales NOT DETECTED NOT DETECTED Final   Enterobacter cloacae complex NOT DETECTED NOT DETECTED Final   Escherichia coli NOT DETECTED NOT DETECTED Final   Klebsiella aerogenes NOT DETECTED NOT DETECTED Final   Klebsiella oxytoca NOT DETECTED NOT DETECTED Final   Klebsiella pneumoniae NOT DETECTED NOT DETECTED Final   Proteus species NOT DETECTED NOT DETECTED Final    Salmonella species NOT DETECTED NOT DETECTED Final   Serratia marcescens NOT DETECTED NOT DETECTED Final   Haemophilus influenzae NOT DETECTED NOT DETECTED Final   Neisseria meningitidis NOT DETECTED NOT DETECTED Final   Pseudomonas aeruginosa NOT DETECTED NOT DETECTED Final   Stenotrophomonas maltophilia NOT DETECTED NOT DETECTED Final   Candida albicans NOT DETECTED NOT DETECTED Final   Candida auris NOT DETECTED NOT DETECTED Final   Candida glabrata NOT DETECTED NOT DETECTED Final   Candida krusei NOT DETECTED NOT DETECTED Final   Candida parapsilosis NOT DETECTED NOT DETECTED Final   Candida tropicalis NOT DETECTED NOT DETECTED Final   Cryptococcus neoformans/gattii NOT DETECTED NOT  DETECTED Final    Comment: Performed at [facility], [address], [place], [place] [address]  Resp panel by RT-PCR (RSV, Flu A&B, Covid) Anterior Nasal Swab     Status: Abnormal   Collection Time: 05/24/24  1:38 AM   Specimen: Anterior Nasal Swab  Result Value Ref Range Status   SARS Coronavirus 2 by RT PCR NEGATIVE NEGATIVE Final    Comment: (NOTE) SARS-CoV-2 target nucleic acids are NOT DETECTED.  The SARS-CoV-2 RNA is generally detectable in upper respiratory specimens during the acute phase of infection. The lowest concentration of SARS-CoV-2 viral copies this assay can detect is 138 copies/mL. A negative result does not preclude SARS-Cov-2 infection and should not be used as the sole basis for treatment or other patient management decisions. A negative result may occur with  improper specimen collection/handling, submission of specimen other than nasopharyngeal swab, presence of viral mutation(s) within the areas targeted by this assay, and inadequate number of viral copies(<138 copies/mL). A negative result must be combined with clinical observations, patient history, and epidemiological information. The expected result is Negative.  Fact Sheet for Patients:   bloggercourse.com  Fact Sheet for Healthcare Providers:  seriousbroker.it  This test is no t yet approved or cleared by the [place]  FDA and  has been authorized for detection and/or diagnosis of SARS-CoV-2 by FDA under an Emergency Use Authorization (EUA). This EUA will remain  in effect (meaning this test can be used) for the duration of the COVID-19 declaration under Section 564(b)(1) of the Act, 21 U.S.C.section 360bbb-3(b)(1), unless the authorization is terminated  or revoked sooner.       Influenza A by PCR NEGATIVE NEGATIVE Final   Influenza B by PCR NEGATIVE NEGATIVE Final    Comment: (NOTE) The Xpert Xpress SARS-CoV-2/FLU/RSV plus assay is intended as an aid in the diagnosis of influenza from Nasopharyngeal swab specimens and should not be used as a sole basis for treatment. Nasal washings and aspirates are unacceptable for Xpert Xpress SARS-CoV-2/FLU/RSV testing.  Fact Sheet for Patients: bloggercourse.com  Fact Sheet for Healthcare Providers: seriousbroker.it  This test is not yet approved or cleared by the [place]  FDA and has been authorized for detection and/or diagnosis of SARS-CoV-2 by FDA under an Emergency Use Authorization (EUA). This EUA will remain in effect (meaning this test can be used) for the duration of the COVID-19 declaration under Section 564(b)(1) of the Act, 21 U.S.C. section 360bbb-3(b)(1), unless the authorization is terminated or revoked.     Resp Syncytial Virus by PCR POSITIVE (A) NEGATIVE Final    Comment: (NOTE) Fact Sheet for Patients: bloggercourse.com  Fact Sheet for Healthcare Providers: seriousbroker.it  This test is not yet approved or cleared by the [place]  FDA and has been authorized for detection and/or diagnosis of SARS-CoV-2 by FDA under an Emergency Use  Authorization (EUA). This EUA will remain in effect (meaning this test can be used) for the duration of the COVID-19 declaration under Section 564(b)(1) of the Act, 21 U.S.C. section 360bbb-3(b)(1), unless the authorization is terminated or revoked.  Performed at [facility], [address], [place], [place] [address]          Radiology Studies: US  THYROID Result Date: 05/25/2024 CLINICAL DATA:  Incidental on CT.  [phone] Thyroid nodule [id number] EXAM: THYROID ULTRASOUND TECHNIQUE: Ultrasound examination of the thyroid gland and adjacent soft tissues was performed. COMPARISON:  CTA chest, 05/23/2024. FINDINGS: Parenchymal Echotexture: Mildly heterogenous Isthmus: Poorly visualized, measures approximately 1.0 cm  Right lobe: 5.2 x 2.1 x 2.1 cm Left lobe: 4.7 x 2.7 x 2.5 cm _________________________________________________________ Estimated total number of nodules >/= 1 cm: 2 Number of spongiform nodules >/=  2 cm not described below (TR1): 0 Number of mixed cystic and solid nodules >/= 1.5 cm not described below (TR2): 0 _________________________________________________________ Cystic nodules, such as labeled LEFT # 1 (1.2 cm superior, TR-1) and LEFT # 2 (1.1 cm inferior, TR-1) do not meet threshold for follow-up nor biopsy per current criteria. Additional sub-1 cm solid and cystic nodules scattered within the gland also do not meet threshold for follow-up nor biopsy per current criteria. No cervical adenopathy or abnormal fluid collection within the imaged neck. IMPRESSION: 1. Heterogeneous, multicystic thyroid gland. 2. Cystic and sub-1 cm solid nodules scattered within the gland do not meet threshold for follow-up nor biopsy per current criteria. The above is in keeping with the ACR TI-RADS recommendations - J Am Coll Radiol 2017;14:587-595. Electronically Signed   By: Thom Hall M.D.   On: 05/25/2024 07:23   CT Angio Chest/Abd/Pel for Dissection W and/or Wo Contrast Result Date:  05/23/2024 CLINICAL DATA:  Shortness of breath right-sided chest pain EXAM: CT ANGIOGRAPHY CHEST, ABDOMEN AND PELVIS TECHNIQUE: Non-contrast CT of the chest was initially obtained. Multidetector CT imaging through the chest, abdomen and pelvis was performed using the standard protocol during bolus administration of intravenous contrast. Multiplanar reconstructed images and MIPs were obtained and reviewed to evaluate the vascular anatomy. RADIATION DOSE REDUCTION: This exam was performed according to the departmental dose-optimization program which includes automated exposure control, adjustment of the mA and/or kV according to patient size and/or use of iterative reconstruction technique. CONTRAST:  OMNIPAQUE  IOHEXOL  350 MG/ML SOLN COMPARISON:  Chest x-ray 05/23/2024, CT 05/15/2024, 05/13/2024, PET CT 05/07/2024, 04/19/2024, 03/07/2017, 12/09/2016 FINDINGS: CTA CHEST FINDINGS Cardiovascular: Non contrasted images of the chest demonstrate no acute intramural hematoma. Postoperative appearance of the aorta with graft repair of the ascending aorta, and endovascular stent graft repair of the aortic arch and descending thoracic aorta. 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 No significant pericardial effusion Mediastinum/Nodes: Patent trachea. Multiple thyroid nodules measuring up to 17 mm. Mildly enlarged right paratracheal nodes measuring up to 15 mm as before. Esophagus within normal limits. Lungs/Pleura: Emphysema. Spiculated right upper lobe pulmonary mass measuring 3.5 cm, previously 3.5 cm. This is contiguous with bandlike scarring in the right upper lobe. Right lateral upper lobe pleural mass measures 2.9 by 1.1 cm on series 6, image 73, hypermetabolic on prior PET  CT. Interval airspace disease at the right middle lobe and progressive peribronchovascular ground-glass and mild consolidation at the right greater than left lower lobes. No pleural effusion or pneumothorax. Musculoskeletal: No acute or suspicious osseous abnormality. Sternotomy. Mild chronic superior endplate deformities at T11 and T12 Review of the MIP images confirms the above findings. CTA ABDOMEN AND PELVIS FINDINGS VASCULAR Aorta: Chronic dissection of the abdominal aorta as before, this terminates at the distal infrarenal abdominal aorta, just proximal to the IMA origin. Aneurysmal dilatation of the proximal abdominal aorta at the distal portion of the aortic stent and slightly distal to with, this measures 5.7 x 5 cm on series 9, image 133, previously 5.8 cm. This has increased compared to prior exams from 2018. 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 No aneurysm. There is mild to moderate stenosis slightly distal to the origin with wide patency distally SMA: Patent without evidence of aneurysm, dissection, vasculitis or significant stenosis. Arises from the true lumen Renals: Fenestration at the level of the renal arteries providing cross flow. Right renal artery arises from the false lumen, main left renal artery arises from the true lumen. Both arteries are patent and without stenosis or aneurysm. Distal left accessory lower pole artery arising from the distal aorta is patent. IMA: Patent without evidence of aneurysm, dissection, vasculitis or significant stenosis. Inflow: Atherosclerosis. No aneurysm or dissection. Chronically occluded femoral femoral bypass graft. Aneurysmal dilatation of the right greater than left common femoral artery, measures 2.4 cm on the right and 2.3 cm on the left. Veins: Suboptimally assessed Review of the MIP images confirms the above findings. NON-VASCULAR Hepatobiliary: No focal liver abnormality is  seen. No gallstones, gallbladder wall thickening, or biliary dilatation. Pancreas: Unremarkable. No pancreatic ductal dilatation or surrounding inflammatory changes. Spleen: Normal in size without focal abnormality. Adrenals/Urinary Tract: Adrenal glands are normal. No hydronephrosis. The bladder is unremarkable Stomach/Bowel: Stomach within normal limits. No dilated small bowel. No acute bowel wall thickening. Lymphatic: No suspicious lymph nodes Reproductive: Negative for mass Other: . No ascites or free air. Large left inguinal hernia containing mesenteric fat and small bowel, extending into the scrotum. No evidence for and incarceration or obstruction Musculoskeletal: Chronic compression deformity at L1 Review of the MIP images confirms the above findings. IMPRESSION: 1. Negative for acute aortic dissection. 2. Postoperative changes of the thoracic aorta with graft repair of the ascending aorta and endovascular stent graft repair of the aortic arch and descending thoracic aorta. 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 3. 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 Continued vascular surgery follow-up recommended. 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 4. Emphysema. Interval airspace disease at the right middle lobe and progressive peribronchovascular ground-glass and mild consolidation at the right greater than left lower lobes, suspect for pneumonia. 5. Spiculated right upper lobe pulmonary mass measuring 3.5 cm, concerning for malignancy. Right lateral upper lobe pleural mass measures 2.9 x 1.1 cm, hypermetabolic on prior PET CT and consistent with malignancy. 6. Large  left inguinal hernia containing mesenteric fat and small bowel, extending into the scrotum. No evidence for incarceration or obstruction. 7. Multiple thyroid nodules measuring up to 17 mm. Nonemergent thyroid ultrasound previously recommended if not already performed Aortic Atherosclerosis (ICD10-I70.0) and Emphysema (ICD10-J43.9). Electronically Signed   By: Luke Bun M.D.   On: 05/23/2024 23:38   DG Chest Port 1 View if patient is in a treatment room. Result Date: 05/23/2024 EXAM: 1 VIEW(S) XRAY OF THE CHEST 05/23/2024 10:18:30 PM COMPARISON: 05/18/2024 CLINICAL HISTORY: Suspected sepsis. FINDINGS: LUNGS AND PLEURA: Lower lung volumes compared to prior exam. Opacity at lung bases. Small right pleural effusion. Stable 4 cm right upper lobe mass. Stable pleural thickening along right lateral chest. No pneumothorax. HEART AND MEDIASTINUM: Median sternotomy and thoracic aortic stent graft noted. No acute abnormality of the cardiac and mediastinal silhouettes. BONES AND SOFT TISSUES: No acute osseous abnormality. IMPRESSION: 1. Low lung volumes with  opacity at the lung bases and a small right pleural effusion. 2. Stable 4 cm right upper lobe mass. Electronically signed by: Elsie Gravely MD 05/23/2024 10:25 PM EST RP Workstation: [id number]        Scheduled Meds:  aspirin   162.5 mg Oral Daily   enoxaparin  (LOVENOX ) injection  40 mg Subcutaneous Q24H   gabapentin   600 mg Oral TID   lidocaine   1 patch Transdermal Q24H   Continuous Infusions:  cefTRIAXone  (ROCEPHIN )  IV 2 g (05/25/24 0855)          Derryl Duval, MD [facility] 05/25/2024, 4:07 PM   "

## 2024-05-26 DIAGNOSIS — J189 Pneumonia, unspecified organism: Secondary | ICD-10-CM | POA: Diagnosis not present

## 2024-05-26 LAB — CBC WITH DIFFERENTIAL/PLATELET
Abs Immature Granulocytes: 0.02 10*3/uL (ref 0.00–0.07)
Basophils Absolute: 0.1 10*3/uL (ref 0.0–0.1)
Basophils Relative: 1 %
Eosinophils Absolute: 0.5 10*3/uL (ref 0.0–0.5)
Eosinophils Relative: 7 %
HCT: 41.5 % (ref 39.0–52.0)
Hemoglobin: 13.1 g/dL (ref 13.0–17.0)
Immature Granulocytes: 0 %
Lymphocytes Relative: 22 %
Lymphs Abs: 1.7 10*3/uL (ref 0.7–4.0)
MCH: 28.1 pg (ref 26.0–34.0)
MCHC: 31.6 g/dL (ref 30.0–36.0)
MCV: 88.9 fL (ref 80.0–100.0)
Monocytes Absolute: 0.6 10*3/uL (ref 0.1–1.0)
Monocytes Relative: 8 %
Neutro Abs: 4.8 10*3/uL (ref 1.7–7.7)
Neutrophils Relative %: 62 %
Platelets: 263 10*3/uL (ref 150–400)
RBC: 4.67 MIL/uL (ref 4.22–5.81)
RDW: 13.9 % (ref 11.5–15.5)
WBC: 7.6 10*3/uL (ref 4.0–10.5)
nRBC: 0 % (ref 0.0–0.2)

## 2024-05-26 LAB — CULTURE, BLOOD (ROUTINE X 2)
Special Requests: ADEQUATE
Special Requests: ADEQUATE

## 2024-05-26 LAB — BASIC METABOLIC PANEL WITH GFR
Anion gap: 9 (ref 5–15)
BUN: 20 mg/dL (ref 8–23)
CO2: 23 mmol/L (ref 22–32)
Calcium: 8.3 mg/dL — ABNORMAL LOW (ref 8.9–10.3)
Chloride: 107 mmol/L (ref 98–111)
Creatinine, Ser: 0.99 mg/dL (ref 0.61–1.24)
GFR, Estimated: >60 mL/min
Glucose, Bld: 113 mg/dL — ABNORMAL HIGH (ref 70–99)
Potassium: 4.4 mmol/L (ref 3.5–5.1)
Sodium: 139 mmol/L (ref 135–145)

## 2024-05-26 MED ORDER — HYDROCODONE BIT-HOMATROP MBR 5-1.5 MG/5ML PO SOLN
5.0000 mL | Freq: Four times a day (QID) | ORAL | Status: DC | PRN
  Administered 2024-05-27: 5 mL via ORAL
  Filled 2024-05-26: qty 5

## 2024-05-27 DIAGNOSIS — J189 Pneumonia, unspecified organism: Secondary | ICD-10-CM | POA: Diagnosis not present

## 2024-05-27 NOTE — Progress Notes (Signed)
 " PROGRESS NOTE    Victor [name]  FMW:[id number] DOB: 19-Jul-1955 DOA: 05/23/2024 PCP: Patient, No Pcp Per    Brief Narrative:   Victor [name] is a 69 y.o. male with past medical history significant for HFmrEF, HTN, aortic dissection s/p repair, PAD s/p femoropopliteal bypass, CAD, recent finding of right upper lobe lung mass (pending outpatients bronchoscopy with Dr. Shelah) who presented to [facility] ED on 05/23/2024 with right-sided chest pain, shortness of breath.  Patient reports right-sided chest pain rating to his back as well as with shortness of breath, chills and cough for the last week.  He ran out of his home oxycodone .  No other complaints.  Denies nausea/vomiting, no abdominal pain, no diarrhea.  In the ED, temperature 97.7 F, HR 69, RR 16, BP 92/69, SpO2 96% on room air.  WBC 5.6, hemoglobin 12.9, platelet count 273.  Sodium 139, potassium 4.1, chloride 107, CO2 23, glucose 89, BUN 21, creatinine 0.92.  AST 18, ALT 5, total bili 0.4.  Lactic acid 3.0. high sensitivity troponin 47>50.  INR 1.2.  COVID/influenza PCR negative.  RSV PCR positive.  Urinalysis unrevealing.  Chest x-ray with low lung volumes with opacity at lung bases and small right pleural effusion, stable for [place] or right upper lobe mass.  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, aneurysmal dilation of the right greater than left common femoral arteries, emphysema, airspace disease right middle lobe  and progressive peribronchial vesicular ground glass and mild consolidation at the right greater than left lower lobes concerning for pneumonia, spiculated right upper lobe pulmonary mass measuring 3.5 cm concerning for malignancy, right lateral upper lobe pleural mass measuring 2.9 x 1.1 cm, hypermetabolic on prior PET/CT and consistent with malignancy, large left inguinal hernia containing mesenteric fat and small bowel extending into the scrotum without evidence for incarceration or obstruction, multiple thyroid nodules measuring up to 17 mm.  Patient was given azithromycin  and doxycycline  in the ED.  TRH consulted for admission.  Assessment & Plan:   Community-acquired pneumonia RSV viral infection Patient presenting with shortness of breath, cough over the last 1 week.  No fever, no tachycardia, no apnea, no leukocytosis and oxygenating well on room air.  Lactic acid initially elevated 3.0 and resolved.  RSV PCR positive.  CT angiogram chest/abdomen/pelvis showing interval airspace disease at the right middle lobe and progressive peribronchial vesicular ground glass and mild consolidation in the right greater than left lower lobes concerning for pneumonia.  Procalcitonin elevated 2.08. -- Ceftriaxone  2 g IV q24h x 5 days -- Robitussin, Hycodan as needed cough -- Droplet precaution  Right upper lobe lung mass concerning for malignancy Followed by pulmonology and scheduled for outpatient bronchoscopy on 2/16 with Dr. Shelah after cardiac clearance.   Right-sided chest pain Likely related to pneumonia and most likely his right upper lobe lung mass consistent with malignancy.  CTA negative for acute aortic dissection.  EKG without acute ischemic changes.  Troponin mildly elevated and consistent with previous values, continue to trend.   -- Continue pain management: Oxy  IR PRN, Tylenol  PRN, and lidocaine  patch.   -- Continue home gabapentin .  Positive blood cultures likely contaminant Blood  cultures positive for Streptococcus Christatus and Streptococcus mitis/oralis.  Previous hospitalist, Dr. Mcarthur discussed with infectious disease, suspected contamination.   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  Patient has not seen vascular surgery for several years and will need close follow-up.   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 daily -- Will need close vascular surgery follow-up.   Multiple thyroid nodules Noted on CT.  Thyroid ultrasound with heterogeneous multicystic thyroid gland, cystic and sub-1 cm solid nodule scattered within the gland do not meet threshold for follow-up nor biopsy per criteria.   Chronic HFmrEF TTE September 2018 showing EF 45 to 50%.   DVT prophylaxis: enoxaparin  (LOVENOX ) injection 40 mg Start: 05/24/24 1000    Code Status: Full Code Family Communication: No family present at bedside this morning  Disposition Plan:  Level of care: Telemetry Status is: Inpatient Remains inpatient appropriate because: IV antibiotics, anticipate discharge home tomorrow    Consultants:  None  Procedures:  Thyroid ultrasound  Antimicrobials:  Azithromycin  Ceftriaxone    Subjective: Patient seen examined bedside, lying in bed.  Continues with mild cough.  Remains on IV antibiotics.  Afebrile, no leukocytosis, on room air.  Discussed plan to discharge home tomorrow.  Patient requesting refill of his oxycodone  on discharge.  Continue to discuss with patient many of his symptoms likely related to his lung mass including cough which will not resolve despite antibiotic treatment.  No other questions or concerns at this time.  Denies headache, no dizziness, no chest pain, no palpitations, no abdominal  pain, no fever/chills/night sweats, no nausea/vomiting/diarrhea.  No acute events overnight per nursing staff.  Objective: Vitals:   05/26/24 0522 05/26/24 1355 05/26/24 2125 05/27/24 0513  BP: (!) 140/80 124/80 105/79 130/75  Pulse: (!) 102 100 87 (!) 102  Resp:   17 20  Temp: 98.1 F (36.7 C) 98.3 F (36.8 C) 98.5 F (36.9 C) 98.9 F (37.2 C)  TempSrc: Oral Oral Oral Oral  SpO2: 95% 94% 98% 96%  Weight:      Height:        Intake/Output Summary (Last 24 hours) at 05/27/2024 1111 Last data filed at 05/27/2024 0935 Gross per 24 hour  Intake 820 ml  Output 1680 ml  Net -860 ml   Filed Weights   05/25/24 0841  Weight: 64.5 kg    Examination:  Physical Exam: GEN: NAD, alert and oriented x 3, chronically ill in appearance HEENT: NCAT, PERRL, EOMI, sclera clear, MMM PULM: CTAB w/o wheezes/crackles, normal respiratory effort, on room air with SpO2 96% at rest CV: RRR w/o M/G/R GI: abd soft, NTND, + BS MSK: no peripheral edema, moves all extremities independently with preserved muscle strength NEURO: No focal neurological deficit PSYCH: normal mood/affect Integumentary: No concerning rashes/lesions/wounds noted on exposed skin surfaces    Data Reviewed: I have personally reviewed following labs and imaging studies  CBC: Recent Labs  Lab 05/23/24 2159 05/26/24 0425  WBC 5.6 7.6  NEUTROABS 3.6 4.8  HGB 12.9* 13.1  HCT 41.3 41.5  MCV 91.0 88.9  PLT 273 263   Basic Metabolic Panel: Recent Labs  Lab 05/24/24 0045 05/26/24 0425  NA 139 139  K 4.1 4.4  CL 107 107  CO2 23 23  GLUCOSE 89 113*  BUN 21 20  CREATININE 0.92 0.99  CALCIUM  7.9* 8.3*   GFR: Estimated Creatinine Clearance: 66.1 mL/min (by C-G formula based on SCr of 0.99 mg/dL). Liver Function Tests: Recent Labs  Lab 05/24/24 0045  AST 18  ALT 5  ALKPHOS 79  BILITOT 0.4  PROT 5.9*  ALBUMIN  3.2*   Recent Labs  Lab 05/24/24 0045  LIPASE 16   No results for input(s): AMMONIA in the last  168 hours. Coagulation Profile: Recent Labs  Lab 05/24/24 0045  INR 1.2   Cardiac Enzymes: No results for input(s): CKTOTAL, CKMB, CKMBINDEX, TROPONINI in the last 168 hours. BNP (last 3 results) Recent Labs    04/19/24 0834 05/13/24 2033  PROBNP 161.0 300.0*   HbA1C: No results for input(s): HGBA1C in the last 72 hours. CBG: No results for input(s): GLUCAP in the last 168 hours. Lipid Profile: No results for input(s): CHOL, HDL, LDLCALC, TRIG, CHOLHDL, LDLDIRECT in the last 72 hours. Thyroid Function Tests: No results for input(s): TSH, T4TOTAL, FREET4, T3FREE, THYROIDAB in the last 72 hours. Anemia Panel: No results for input(s): VITAMINB12, FOLATE, FERRITIN, TIBC, IRON, RETICCTPCT in the last 72 hours. Sepsis Labs: Recent Labs  Lab 05/23/24 2329 05/24/24 0055 05/24/24 0320  PROCALCITON  --   --  2.08  LATICACIDVEN 3.0* 1.1  --     Recent Results (from the past 240 hours)  Blood Culture (routine x 2)     Status: Abnormal   Collection Time: 05/23/24 11:16 PM   Specimen: BLOOD  Result Value Ref Range Status   Specimen Description   Final    BLOOD BLOOD RIGHT ARM Performed at [facility], [address] [place], [place] [address]    Special Requests   Final    BOTTLES DRAWN AEROBIC AND ANAEROBIC Blood Culture adequate volume Performed at [facility], [address] [place], [place] [address]    Culture  Setup Time   Final    GRAM POSITIVE COCCI IN CHAINS IN BOTH AEROBIC AND ANAEROBIC BOTTLES CRITICAL RESULT CALLED TO, READ BACK BY AND VERIFIED WITH: PHARMD D. TNQQNMI [id number] @ 2041 FH    Culture (A)  Final    STREPTOCOCCUS CRISTATUS THE SIGNIFICANCE OF ISOLATING THIS ORGANISM FROM A SINGLE SET OF BLOOD CULTURES WHEN MULTIPLE SETS ARE DRAWN IS UNCERTAIN. PLEASE NOTIFY THE MICROBIOLOGY DEPARTMENT WITHIN ONE WEEK IF SPECIATION AND SENSITIVITIES ARE REQUIRED. Performed at [facility], [address], [place], [place] [address]    Report Status 05/26/2024 FINAL  Final  Blood Culture (routine x 2)     Status: Abnormal   Collection Time: 05/23/24 11:18 PM   Specimen: BLOOD LEFT ARM  Result Value Ref Range Status   Specimen Description   Final    BLOOD LEFT ARM Performed at [facility], [address], [place], [place] [address]    Special Requests   Final    BOTTLES DRAWN AEROBIC AND ANAEROBIC Blood Culture adequate volume Performed at [facility], [address], [place], [place] [address]    Culture  Setup Time   Final    [facility] POSITIVE COCCI ANAEROBIC BOTTLE ONLY PHARMD D. [place] [id number] @ 2041 FH    Culture (A)  Final    STREPTOCOCCUS MITIS/ORALIS THE SIGNIFICANCE OF ISOLATING THIS ORGANISM FROM A SINGLE SET OF BLOOD CULTURES WHEN MULTIPLE SETS  ARE DRAWN IS UNCERTAIN. PLEASE NOTIFY THE MICROBIOLOGY DEPARTMENT WITHIN ONE WEEK IF SPECIATION AND SENSITIVITIES ARE REQUIRED. Performed at [facility], [address], [place], [place] [address]    Report Status 05/26/2024 FINAL  Final  Blood Culture ID Panel (Reflexed)     Status: Abnormal   Collection Time: 05/23/24 11:18 PM  Result Value Ref Range Status   Enterococcus faecalis NOT DETECTED NOT DETECTED Final   Enterococcus Faecium NOT DETECTED NOT DETECTED Final   Listeria monocytogenes NOT DETECTED NOT DETECTED Final   Staphylococcus species NOT DETECTED NOT DETECTED Final   Staphylococcus aureus (BCID) NOT DETECTED NOT DETECTED Final   Staphylococcus epidermidis NOT DETECTED NOT DETECTED Final   Staphylococcus lugdunensis NOT DETECTED NOT DETECTED Final   Streptococcus species DETECTED (A) NOT DETECTED Final    Comment: Not Enterococcus species, Streptococcus agalactiae, Streptococcus pyogenes, or Streptococcus pneumoniae. CRITICAL RESULT CALLED TO, READ BACK BY AND VERIFIED WITH: PHARMD D. TNQQNMI [id number] @ 2041 FH    Streptococcus agalactiae NOT DETECTED NOT  DETECTED Final   Streptococcus pneumoniae NOT DETECTED NOT DETECTED Final   Streptococcus pyogenes NOT DETECTED NOT DETECTED Final   A.calcoaceticus-baumannii NOT DETECTED NOT DETECTED Final   Bacteroides fragilis NOT DETECTED NOT DETECTED Final   Enterobacterales NOT DETECTED NOT DETECTED Final   Enterobacter cloacae complex NOT DETECTED NOT DETECTED Final   Escherichia coli NOT DETECTED NOT DETECTED Final   Klebsiella aerogenes NOT DETECTED NOT DETECTED Final   Klebsiella oxytoca NOT DETECTED NOT DETECTED Final   Klebsiella pneumoniae NOT DETECTED NOT DETECTED Final   Proteus species NOT DETECTED NOT DETECTED Final   Salmonella species NOT DETECTED NOT DETECTED Final   Serratia marcescens NOT DETECTED NOT DETECTED Final   Haemophilus influenzae NOT DETECTED NOT DETECTED Final   Neisseria meningitidis NOT DETECTED NOT DETECTED Final   Pseudomonas aeruginosa NOT DETECTED NOT DETECTED Final   Stenotrophomonas maltophilia NOT DETECTED NOT DETECTED Final   Candida albicans NOT DETECTED NOT DETECTED Final   Candida auris NOT DETECTED NOT DETECTED Final   Candida glabrata NOT DETECTED NOT DETECTED Final   Candida krusei NOT DETECTED NOT DETECTED Final   Candida parapsilosis NOT DETECTED NOT DETECTED Final   Candida tropicalis NOT DETECTED NOT DETECTED Final   Cryptococcus neoformans/gattii NOT DETECTED NOT DETECTED Final    Comment: Performed at [facility], [address], [place], [place] [address]  Resp panel by RT-PCR (RSV, Flu A&B, Covid) Anterior Nasal Swab     Status: Abnormal   Collection Time: 05/24/24  1:38 AM   Specimen: Anterior Nasal Swab  Result Value Ref Range Status   SARS Coronavirus 2 by RT PCR NEGATIVE NEGATIVE Final    Comment: (NOTE) SARS-CoV-2 target nucleic acids are NOT DETECTED.  The SARS-CoV-2 RNA is generally detectable in upper respiratory specimens during the acute phase of infection. The lowest concentration of SARS-CoV-2 viral copies this assay can  detect is 138 copies/mL. A negative result does not preclude SARS-Cov-2 infection and should not be used as the sole basis for treatment or other patient management decisions. A negative result may occur with  improper specimen collection/handling, submission of specimen other than nasopharyngeal swab, presence of viral mutation(s) within the areas targeted by this assay, and inadequate number of viral copies(<138 copies/mL). A negative result must be combined with clinical observations, patient history, and epidemiological information. The expected result is Negative.  Fact Sheet for Patients:  bloggercourse.com  Fact Sheet for Healthcare Providers:  seriousbroker.it  This  test is no t yet approved or cleared by the [place]  FDA and  has been authorized for detection and/or diagnosis of SARS-CoV-2 by FDA under an Emergency Use Authorization (EUA). This EUA will remain  in effect (meaning this test can be used) for the duration of the COVID-19 declaration under Section 564(b)(1) of the Act, 21 U.S.C.section 360bbb-3(b)(1), unless the authorization is terminated  or revoked sooner.       Influenza A by PCR NEGATIVE NEGATIVE Final   Influenza B by PCR NEGATIVE NEGATIVE Final    Comment: (NOTE) The Xpert Xpress SARS-CoV-2/FLU/RSV plus assay is intended as an aid in the diagnosis of influenza from Nasopharyngeal swab specimens and should not be used as a sole basis for treatment. Nasal washings and aspirates are unacceptable for Xpert Xpress SARS-CoV-2/FLU/RSV testing.  Fact Sheet for Patients: bloggercourse.com  Fact Sheet for Healthcare Providers: seriousbroker.it  This test is not yet approved or cleared by the [place]  FDA and has been authorized for detection and/or diagnosis of SARS-CoV-2 by FDA under an Emergency Use Authorization (EUA). This EUA will remain in  effect (meaning this test can be used) for the duration of the COVID-19 declaration under Section 564(b)(1) of the Act, 21 U.S.C. section 360bbb-3(b)(1), unless the authorization is terminated or revoked.     Resp Syncytial Virus by PCR POSITIVE (A) NEGATIVE Final    Comment: (NOTE) Fact Sheet for Patients: bloggercourse.com  Fact Sheet for Healthcare Providers: seriousbroker.it  This test is not yet approved or cleared by the [place]  FDA and has been authorized for detection and/or diagnosis of SARS-CoV-2 by FDA under an Emergency Use Authorization (EUA). This EUA will remain in effect (meaning this test can be used) for the duration of the COVID-19 declaration under Section 564(b)(1) of the Act, 21 U.S.C. section 360bbb-3(b)(1), unless the authorization is terminated or revoked.  Performed at [facility], [address] [place], [place] [address]          Radiology Studies: No results found.       Scheduled Meds:  aspirin   162.5 mg Oral Daily   enoxaparin  (LOVENOX ) injection  40 mg Subcutaneous Q24H   gabapentin   600 mg Oral TID   lidocaine   1 patch Transdermal Q24H   Continuous Infusions:  cefTRIAXone  (ROCEPHIN )  IV 2 g (05/27/24 1000)     LOS: 3 days    Time spent: 52 minutes spent on 05/27/2024 caring for this patient face-to-face including chart review, ordering labs/tests, documenting, discussion with nursing staff, consultants, updating family and interview/physical exam    Camellia PARAS Shateka Petrea, DO [facility] Available via Epic secure chat 7am-7pm After these hours, please refer to coverage provider listed on [email or web address] 05/27/2024, 11:11 AM   "

## 2024-05-27 NOTE — Progress Notes (Signed)
 OT Cancellation Note  Patient Details Name: MARSHAWN NORMOYLE MRN: [id number] DOB: 1955-12-17   Cancelled Treatment:    Reason Eval/Treat Not Completed: Pain limiting ability to participate Will check back on pt next day   DOTTIE Norvel BIRCH 05/27/2024, 1:18 PM

## 2024-05-27 NOTE — Progress Notes (Signed)
 PT Cancellation Note  Patient Details Name: Victor [name] MRN: [id number] DOB: [birth year]   Cancelled Treatment:    Reason Eval/Treat Not Completed: Patient declined, no reason specified Pt declined to mobilize.  Provided encouragement however pt refused even OOB to recliner at this time.  Pt states he is having pain however RN reports pain meds given 2 hrs ago and not yet due for another 2 hrs. Pt provided with this information and still declined to participate at this time.   Tari CROME [name] 05/27/2024, 12:16 PM Tari KLEIN, DPT Physical Therapist Acute Rehabilitation Services Office: [phone]

## 2024-05-28 ENCOUNTER — Other Ambulatory Visit (HOSPITAL_COMMUNITY): Payer: Self-pay

## 2024-05-28 DIAGNOSIS — J189 Pneumonia, unspecified organism: Secondary | ICD-10-CM | POA: Diagnosis not present

## 2024-05-28 MED ORDER — OXYCODONE HCL 10 MG PO TABS
10.0000 mg | ORAL_TABLET | Freq: Four times a day (QID) | ORAL | 0 refills | Status: AC | PRN
  Filled 2024-05-28: qty 60, 15d supply, fill #0

## 2024-05-28 MED ORDER — HYDROCODONE BIT-HOMATROP MBR 5-1.5 MG/5ML PO SOLN
5.0000 mL | Freq: Four times a day (QID) | ORAL | 0 refills | Status: AC | PRN
  Filled 2024-05-28: qty 120, 6d supply, fill #0

## 2024-05-28 NOTE — Plan of Care (Signed)
  Problem: Activity: Goal: Ability to tolerate increased activity will improve Outcome: Progressing   Problem: Clinical Measurements: Goal: Ability to maintain a body temperature in the normal range will improve Outcome: Progressing   Problem: Respiratory: Goal: Ability to maintain adequate ventilation will improve Outcome: Progressing Goal: Ability to maintain a clear airway will improve Outcome: Progressing   

## 2024-05-28 NOTE — Evaluation (Signed)
 Occupational Therapy Evaluation Patient Details Name: Victor [name] MRN: [id number] DOB: Jan 15, 1956 Today's Date: 05/28/2024   History of Present Illness   69 y.o. male with medical history significant of coronary artery calcification, aortic dissection in 2016 status post multiple surgeries, PAD status post fem-fem bypass in 2016, spinal stroke, hypertension, HFmrEF.  History of recent diagnosis of right upper lobe lung mass and recent hospital admission 1/13-1/14 for pain control when he was seen by pulmonology and initially scheduled for outpatient bronchoscopy 1/29 but was then rescheduled for 2/16 due to recent snowstorm and requiring cardiac clearance. Presented to ED at [facility] on 1/28 for complaints of SOB and severe R side chest pain radiating to the back.     Clinical Impressions Pt admitted with the above concerns. Pt currently with functional limitations due to the deficits listed below (see OT Problem List). Prior to admit, pt reports that he lives in a 2 level home with his nephews. He is normally Mod I for functional mobility and ADL's. Has a RW although states he doesn't use it at baseline. Pt will benefit from acute skilled OT to increase their safety and independence with ADL and functional mobility for ADL to facilitate discharge. Recommend Home health OT services at discharge.       If plan is discharge home, recommend the following:   A little help with walking and/or transfers;Assist for transportation;Assistance with cooking/housework;Help with stairs or ramp for entrance     Functional Status Assessment   Patient has had a recent decline in their functional status and demonstrates the ability to make significant improvements in function in a reasonable and predictable amount of time.     Equipment Recommendations   None recommended by OT      Precautions/Restrictions   Precautions Precautions: Fall Recall of Precautions/Restrictions:  Intact Restrictions Weight Bearing Restrictions Per Provider Order: No     Mobility Bed Mobility Overal bed mobility: Modified Independent    General bed mobility comments: Increased time to complete transition. HOB elevated. Utilized bed rails    Transfers Overall transfer level: Needs assistance Equipment used: None Transfers: Sit to/from Stand Sit to Stand: Contact guard assist  General transfer comment: Pt required external support for stability. Unable to stand upright demonstrating a forward flexed posture at hips. Posterior bias without support      Balance Overall balance assessment: Needs assistance Sitting-balance support: No upper extremity supported, Feet supported Sitting balance-Leahy Scale: Good Sitting balance - Comments: sitting EOB   Standing balance support: Single extremity supported, Reliant on assistive device for balance Standing balance-Leahy Scale: Poor Standing balance comment: held onto bed rail while standing for support      ADL either performed or assessed with clinical judgement   ADL       Grooming: Supervision/safety;Sitting   Upper Body Bathing: Set up;Sitting   Lower Body Bathing: Minimal assistance;Sit to/from stand;Sitting/lateral leans   Upper Body Dressing : Set up;Sitting   Lower Body Dressing: Minimal assistance;Bed level   Toilet Transfer: Contact guard assist;BSC/3in1;Stand-pivot   Toileting- Clothing Manipulation and Hygiene: Set up;Sitting/lateral lean        Vision Baseline Vision/History: 0 No visual deficits Ability to See in Adequate Light: 0 Adequate Patient Visual Report: No change from baseline Vision Assessment?: No apparent visual deficits     Perception Perception: Not tested       Praxis Praxis: Not tested       Pertinent Vitals/Pain Pain Assessment Pain Assessment: 0-10 Pain Score: 10-Worst pain ever  Pain Location: back Pain Descriptors / Indicators: Constant, Grimacing, Discomfort Pain  Intervention(s): Limited activity within patient's tolerance, Monitored during session     Extremity/Trunk Assessment Upper Extremity Assessment Upper Extremity Assessment: Overall WFL for tasks assessed;Right hand dominant   Lower Extremity Assessment Lower Extremity Assessment: Defer to PT evaluation   Cervical / Trunk Assessment Cervical / Trunk Assessment: Kyphotic   Communication Communication Communication: No apparent difficulties   Cognition Arousal: Alert Behavior During Therapy: Flat affect Cognition: No apparent impairments     Following commands: Intact       Cueing  General Comments   Cueing Techniques: Verbal cues              Home Living Family/patient expects to be discharged to:: Private residence Living Arrangements: Other relatives (Nephews) Available Help at Discharge: Family;Available PRN/intermittently (Reports nephews aren't always willing to help. Has a niece that he can call sometimes if needed) Type of Home: House Home Access: Stairs to enter [employer] of Steps: 2 Entrance Stairs-Rails: Left;Right;Can reach both Home Layout: Two level;Bed/bath upstairs;Able to live on main level with bedroom/bathroom Alternate Level Stairs-Number of Steps: flight Alternate Level Stairs-Rails: Left Bathroom Shower/Tub: Tub/shower unit   Bathroom Toilet: Standard     Home Equipment: [employer] (2 wheels);Shower seat;Grab bars - tub/shower;Adaptive equipment Adaptive Equipment: Sock aid        Prior Functioning/Environment Prior Level of Function : Independent/Modified Independent;Driving  Mobility Comments: Stated he normally ambulates without an AD.      OT Problem List: Decreased strength;Impaired balance (sitting and/or standing)   OT Treatment/Interventions: Self-care/ADL training;Therapeutic exercise;Therapeutic activities;Balance training;DME and/or AE instruction;Patient/family education      OT Goals(Current goals can  be found in the care plan section)   Acute Rehab OT Goals Patient Stated Goal: to use urinal OT Goal Formulation: With patient Time For Goal Achievement: 06/11/24 Potential to Achieve Goals: Good   OT Frequency:  Min 2X/week       AM-PAC OT 6 Clicks Daily Activity     Outcome Measure Help from another person eating meals?: None Help from another person taking care of personal grooming?: None Help from another person toileting, which includes using toliet, bedpan, or urinal?: A Little Help from another person bathing (including washing, rinsing, drying)?: A Little Help from another person to put on and taking off regular upper body clothing?: A Little Help from another person to put on and taking off regular lower body clothing?: A Little 6 Click Score: 20   End of Session    Activity Tolerance: Patient tolerated treatment well Patient left: in bed;with call bell/phone within reach;with bed alarm set  OT Visit Diagnosis: Muscle weakness (generalized) (M62.81);Unsteadiness on feet (R26.81)                Time: 9093-9077 OT Time Calculation (min): 16 min Charges:  OT General Charges $OT Visit: 1 Visit OT Evaluation $OT Eval Low Complexity: 1 Low  Leita Howell, OTR/L,CBIS  Supplemental OT - [facility] and [facility] Secure Chat Preferred    Railee Bonillas, Leita BIRCH 05/28/2024, 9:35 AM

## 2024-05-28 NOTE — Evaluation (Signed)
 Physical Therapy Evaluation Patient Details Name: Victor [name] MRN: [id number] DOB: September 29, 1955 Today's Date: 05/28/2024  History of Present Illness  69 yo male admitted with RSV, Pna. Hx of aortic dissection with multiple surgeries, PAD with bypass, spinal sroke, HF, recent diagnosis of lung mass  Clinical Impression  On eval, pt was CGA for mobility. He ambulated ~150 feet with a RW. He tolerated activity fairly well. O2 90% on RA during session. Potential plan for d/c home later today. Will recommend HHPT f/u.         If plan is discharge home, recommend the following: A little help with walking and/or transfers;A little help with bathing/dressing/bathroom;Assistance with cooking/housework;Assist for transportation;Help with stairs or ramp for entrance   Can travel by private vehicle        Equipment Recommendations None recommended by PT  Recommendations for Other Services       Functional Status Assessment Patient has had a recent decline in their functional status and demonstrates the ability to make significant improvements in function in a reasonable and predictable amount of time.     Precautions / Restrictions Precautions Precautions: Fall Restrictions Weight Bearing Restrictions Per Provider Order: No      Mobility  Bed Mobility Overal bed mobility: Modified Independent             General bed mobility comments: Increased time to complete transition. HOB elevated. Utilized bed rails    Transfers Overall transfer level: Needs assistance Equipment used: Rolling walker (2 wheels) Transfers: Sit to/from Stand Sit to Stand: Supervision           General transfer comment: Increased time. Cues for safety.    Ambulation/Gait Ambulation/Gait assistance: Contact guard assist Gait Distance (Feet): 150 Feet Assistive device: Rolling walker (2 wheels) Gait Pattern/deviations: Step-through pattern, Decreased stride length       General Gait Details: CGA  for safety. Cues for safety. Pt tends to have increaesed flexed posturing (he is able to self correct). O2 90% on RA  Stairs            Wheelchair Mobility     Tilt Bed    Modified Rankin (Stroke Patients Only)       Balance Overall balance assessment: Needs assistance         Standing balance support: Reliant on assistive device for balance, Bilateral upper extremity supported, During functional activity Standing balance-Leahy Scale: Poor                               Pertinent Vitals/Pain Pain Assessment Pain Assessment: Faces Faces Pain Scale: Hurts a little bit Pain Location: back Pain Descriptors / Indicators: Discomfort Pain Intervention(s): Monitored during session    Home Living Family/patient expects to be discharged to:: Private residence Living Arrangements: Other relatives (Nephews) Available Help at Discharge: Family;Available PRN/intermittently (Reports nephews aren't always willing to help. Has a niece that he can call sometimes if needed) Type of Home: House Home Access: Stairs to enter Entrance Stairs-Rails: Left;Right;Can reach both Entrance Stairs-Number of Steps: 2 Alternate Level Stairs-Number of Steps: flight Home Layout: Two level;Bed/bath upstairs;Able to live on main level with bedroom/bathroom Home Equipment: Rolling Walker (2 wheels);Shower seat;Grab bars - tub/shower;Adaptive equipment      Prior Function Prior Level of Function : Independent/Modified Independent;Driving             Mobility Comments: Stated he normally ambulates without an AD.  Extremity/Trunk Assessment   Upper Extremity Assessment Upper Extremity Assessment: Defer to OT evaluation    Lower Extremity Assessment Lower Extremity Assessment: Generalized weakness    Cervical / Trunk Assessment Cervical / Trunk Assessment: Kyphotic  Communication   Communication Communication: No apparent difficulties    Cognition Arousal:  Alert Behavior During Therapy: WFL for tasks assessed/performed                             Following commands: Intact       Cueing Cueing Techniques: Verbal cues     General Comments      Exercises     Assessment/Plan    PT Assessment Patient needs continued PT services  PT Problem List Decreased strength;Decreased activity tolerance;Decreased balance;Decreased mobility       PT Treatment Interventions DME instruction;Gait training;Functional mobility training;Therapeutic activities;Therapeutic exercise;Patient/family education;Balance training    PT Goals (Current goals can be found in the Care Plan section)  Acute Rehab PT Goals Patient Stated Goal: feel better PT Goal Formulation: With patient Time For Goal Achievement: 06/11/24 Potential to Achieve Goals: Good    Frequency Min 3X/week     Co-evaluation               AM-PAC PT 6 Clicks Mobility  Outcome Measure Help needed turning from your back to your side while in a flat bed without using bedrails?: None Help needed moving from lying on your back to sitting on the side of a flat bed without using bedrails?: None Help needed moving to and from a bed to a chair (including a wheelchair)?: A Little Help needed standing up from a chair using your arms (e.g., wheelchair or bedside chair)?: A Little Help needed to walk in hospital room?: A Little Help needed climbing 3-5 steps with a railing? : A Little 6 Click Score: 20    End of Session Equipment Utilized During Treatment: Gait belt Activity Tolerance: Patient tolerated treatment well Patient left: in bed;with call bell/phone within reach;with nursing/sitter in room   PT Visit Diagnosis: Muscle weakness (generalized) (M62.81);Difficulty in walking, not elsewhere classified (R26.2)    Time: 8941-8881 PT Time Calculation (min) (ACUTE ONLY): 20 min   Charges:   PT Evaluation $PT Eval Low Complexity: 1 Low   PT General Charges $$ ACUTE  PT VISIT: 1 Visit            Dannial SQUIBB, PT Acute Rehabilitation  Office: [phone]

## 2024-05-28 NOTE — TOC Transition Note (Signed)
 Transition of Care [facility]) - Discharge Note   Patient Details  Name: Victor [name] MRN: [id number] Date of Birth: February 14, 1956  Transition of Care [facility]) CM/SW Contact:  Tawni CHRISTELLA Eva, LCSW Phone Number: 05/28/2024, 12:57 PM   Clinical Narrative:    CSW spoke with the pt. The pt is recommended for home health services; however, a barrier to home health is that the pt does not have an established PCP. The pt agreed to have a PCP list added to the AVS. The pt reports having transportation issues getting to appointments. CSW explained that the pt can receive transportation assistance through his [facility] Medicare plan. CSW attached transportation information to the patients AVS.  The pt is requesting a rollator. CSW sent a referral to [employer] for a rollator to be delivered to the pts room prior to discharge. The pt also inquired about housing assistance, as he will need to leave his current housing. CSW explained that only shelter resources could be offered. The pt declined shelter resources. Pt's friend will provided transport home upon d/c. no further ICM needs, ICM sign off     Barriers to Discharge: Barriers Resolved   Patient Goals and CMS Choice Patient states their goals for this hospitalization and ongoing recovery are:: retrun home          Discharge Placement                    Patient and family notified of of transfer: 05/28/24  Discharge Plan and Services Additional resources added to the After Visit Summary for                  DME Arranged: Walker rolling with seat DME Agency: [employer] Date DME Agency Contacted: 05/28/24 Time DME Agency Contacted: 1256 Representative spoke with at DME Agency: jermaine            Social Drivers of Health (SDOH) Interventions SDOH Screenings   Food Insecurity: No Food Insecurity (05/24/2024)  Housing: Low Risk (05/24/2024)  Transportation Needs: No Transportation Needs (05/24/2024)  Recent Concern:  Transportation Needs - Unmet Transportation Needs (05/18/2024)  Utilities: Not At Risk (05/24/2024)  Depression (PHQ2-9): High Risk (04/27/2024)  Social Connections: Patient Declined (05/24/2024)  Tobacco Use: Medium Risk (05/24/2024)     Readmission Risk Interventions     No data to display

## 2024-05-28 NOTE — Discharge Instructions (Addendum)
 Managed Medicare Transportation:  -If you have at managed Medicare plan (UHC/BCBS/Humana etc) please call  Member Services on the back of your insurance card to assess potential nonemergency ride benefits.  -UHC Medicare or [facility] Dual Complete plan call Safe Ride [phone]  PCP List  Bascom Claudene Borer, DNP Specialty: Internal Medicine, Nurse Practitioner  [facility] [address] [place], [place] [address] Main Phone: [phone]  [facility]   Lauraine FORBES Pereyra, NP Specialty: Geriatrics, Internal Medicine  [facility] [address] [place], [place] [address] Main Phone: [phone]  [facility]   Boby LITTIE Mackintosh, NP Specialty: Internal Medicine  [facility] at [facility] [address] [place], [place] [address] Main Phone: [phone]  [facility]   Debby LITTIE Molt, MD Specialty: Internal Medicine  [facility] [address] [place], [place] [address] Main Phone: [phone]  [facility]   Corean Morgan Ku, FNP-C Specialty: Internal Medicine, Nurse Practitioner  [facility] at [facility] [address] [place], [place] [address] Main Phone: [phone]  [facility]   [place]. Purcell, MD Specialty: Internal Medicine  [facility] at [facility] [address] [place], [place] [address] Main Phone: [phone]  [facility]   Harlene Venson An, NP Specialty: Geriatrics, Internal Medicine  [facility] [address] [place], [place] [address] Main Phone: [phone]  [facility]   Amy Almarie Cluster, NP Specialty: Geriatrics, Internal Medicine  [facility] [address] [place], [place] [address] Main Phone: [phone]  [facility]   Dinah JAYSON Plough, FNP-C, NP Specialty: Internal Medicine  [facility] [address] [place], [place] [address] Main Phone: [phone]  [facility]   Gaines Nichole Ada, [place] Specialty: Internal Medicine  [facility] [address] [place] [address] [place], [place] [address] Main Phone: [phone]  [facility]   Bruna Creighton, FNP-C Specialty: Internal Medicine  [facility] [address] [place] [address] [place], [place] [address] Main Phone: [phone]  [facility]   Estela Myrick Ly, MD Specialty: Internal Medicine  [facility] [address] [place], [place] [address] Main Phone: [phone]  [facility]   Bernardino Cordella Cone, MD Specialty: Internal Medicine  [facility] [address] [place], [place] [address] Main Phone: [phone]  [facility]   Greig Drones, NP Specialty: Internal Medicine  [facility] at [facility] [address] [place], [place] [address] Main Phone: [phone]  [facility]   Tinnie Arlean Harada, NP Specialty: Internal Medicine, Nurse Practitioner  [facility] at [facility] [address] [place], [place] [address] Main Phone: [phone]  [facility]   Cleatus Specking, MD Specialty: Internal Medicine  [facility] [address] [place], [place] [address] Main Phone: [phone]  [facility]   Neale Carpen, [place] Specialty: Adult Gerontology Nurse Practitioner, Internal Medicine  [facility] [address] [place], [place] [address] Main Phone:  [phone]  [facility]   Angeline Trudy Laura, NP Specialty: Internal Medicine  [facility] [address] [place], [place] [address] Main Phone: [phone]  [facility]   Harlene Saddler, MD Specialty: Internal Medicine  [facility] & [facility] Medicine at [facility] [address] [place] [address] [place], [place] [address] Main Phone: [phone]  [facility]

## 2024-05-28 NOTE — Progress Notes (Signed)
 Discharge meds in a secure bag delivered to patient by this RN

## 2024-05-30 ENCOUNTER — Emergency Department (HOSPITAL_COMMUNITY): Admission: EM | Admit: 2024-05-30 | Discharge: 2024-05-30 | Disposition: A | Discharge status: Home / Self Care

## 2024-05-30 ENCOUNTER — Ambulatory Visit (HOSPITAL_COMMUNITY)
Admission: RE | Admit: 2024-05-30 | Discharge: 2024-05-30 | Attending: Cardiovascular Disease | Admitting: Cardiovascular Disease

## 2024-05-30 ENCOUNTER — Other Ambulatory Visit: Payer: Self-pay

## 2024-05-30 ENCOUNTER — Encounter (HOSPITAL_COMMUNITY): Payer: Self-pay | Admitting: Radiology

## 2024-05-30 ENCOUNTER — Encounter (HOSPITAL_COMMUNITY): Payer: Self-pay

## 2024-05-30 ENCOUNTER — Emergency Department (HOSPITAL_COMMUNITY)

## 2024-05-30 DIAGNOSIS — M545 Low back pain, unspecified: Secondary | ICD-10-CM | POA: Insufficient documentation

## 2024-05-30 DIAGNOSIS — I2510 Atherosclerotic heart disease of native coronary artery without angina pectoris: Secondary | ICD-10-CM

## 2024-05-30 DIAGNOSIS — R0602 Shortness of breath: Secondary | ICD-10-CM

## 2024-05-30 DIAGNOSIS — R0989 Other specified symptoms and signs involving the circulatory and respiratory systems: Secondary | ICD-10-CM

## 2024-05-30 DIAGNOSIS — R079 Chest pain, unspecified: Secondary | ICD-10-CM

## 2024-05-30 DIAGNOSIS — R0782 Intercostal pain: Secondary | ICD-10-CM | POA: Insufficient documentation

## 2024-05-30 DIAGNOSIS — G8929 Other chronic pain: Secondary | ICD-10-CM | POA: Insufficient documentation

## 2024-05-30 DIAGNOSIS — R072 Precordial pain: Secondary | ICD-10-CM

## 2024-05-30 DIAGNOSIS — I10 Essential (primary) hypertension: Secondary | ICD-10-CM

## 2024-05-30 LAB — CBC WITH DIFFERENTIAL/PLATELET
Abs Immature Granulocytes: 0.02 10*3/uL (ref 0.00–0.07)
Basophils Absolute: 0 10*3/uL (ref 0.0–0.1)
Basophils Relative: 0 %
Eosinophils Absolute: 0 10*3/uL (ref 0.0–0.5)
Eosinophils Relative: 0 %
HCT: 42 % (ref 39.0–52.0)
Hemoglobin: 13.4 g/dL (ref 13.0–17.0)
Immature Granulocytes: 0 %
Lymphocytes Relative: 8 %
Lymphs Abs: 0.8 10*3/uL (ref 0.7–4.0)
MCH: 28.2 pg (ref 26.0–34.0)
MCHC: 31.9 g/dL (ref 30.0–36.0)
MCV: 88.4 fL (ref 80.0–100.0)
Monocytes Absolute: 0.6 10*3/uL (ref 0.1–1.0)
Monocytes Relative: 6 %
Neutro Abs: 8.5 10*3/uL — ABNORMAL HIGH (ref 1.7–7.7)
Neutrophils Relative %: 86 %
Platelets: 354 10*3/uL (ref 150–400)
RBC: 4.75 MIL/uL (ref 4.22–5.81)
RDW: 13.7 % (ref 11.5–15.5)
WBC: 9.9 10*3/uL (ref 4.0–10.5)
nRBC: 0 % (ref 0.0–0.2)

## 2024-05-30 LAB — COMPREHENSIVE METABOLIC PANEL WITH GFR
ALT: 34 U/L (ref 0–44)
AST: 135 U/L — ABNORMAL HIGH (ref 15–41)
Albumin: 4 g/dL (ref 3.5–5.0)
Alkaline Phosphatase: 96 U/L (ref 38–126)
Anion gap: 22 — ABNORMAL HIGH (ref 5–15)
BUN: 32 mg/dL — ABNORMAL HIGH (ref 8–23)
CO2: 18 mmol/L — ABNORMAL LOW (ref 22–32)
Calcium: 9.5 mg/dL (ref 8.9–10.3)
Chloride: 97 mmol/L — ABNORMAL LOW (ref 98–111)
Creatinine, Ser: 0.99 mg/dL (ref 0.61–1.24)
GFR, Estimated: >60 mL/min
Glucose, Bld: 59 mg/dL — ABNORMAL LOW (ref 70–99)
Potassium: 4.8 mmol/L (ref 3.5–5.1)
Sodium: 137 mmol/L (ref 135–145)
Total Bilirubin: 1.1 mg/dL (ref 0.0–1.2)
Total Protein: 8 g/dL (ref 6.5–8.1)

## 2024-05-30 LAB — I-STAT CHEM 8, ED
BUN: 39 mg/dL — ABNORMAL HIGH (ref 8–23)
Calcium, Ion: 1.14 mmol/L — ABNORMAL LOW (ref 1.15–1.40)
Chloride: 103 mmol/L (ref 98–111)
Creatinine, Ser: 1 mg/dL (ref 0.61–1.24)
Glucose, Bld: 58 mg/dL — ABNORMAL LOW (ref 70–99)
HCT: 43 % (ref 39.0–52.0)
Hemoglobin: 14.6 g/dL (ref 13.0–17.0)
Potassium: 4.7 mmol/L (ref 3.5–5.1)
Sodium: 139 mmol/L (ref 135–145)
TCO2: 21 mmol/L — ABNORMAL LOW (ref 22–32)

## 2024-05-30 LAB — I-STAT CG4 LACTIC ACID, ED
Lactic Acid, Venous: 1.1 mmol/L (ref 0.5–1.9)
Lactic Acid, Venous: 1.4 mmol/L (ref 0.5–1.9)

## 2024-05-30 LAB — ECHOCARDIOGRAM COMPLETE
AR max vel: 2.61 cm2
AV Area VTI: 2.25 cm2
AV Area mean vel: 2.41 cm2
AV Mean grad: 3 mmHg
AV Peak grad: 6.7 mmHg
Ao pk vel: 1.29 m/s
Area-P 1/2: 4.57 cm2
S' Lateral: 3 cm

## 2024-05-30 LAB — LIPASE, BLOOD: Lipase: <10 U/L — ABNORMAL LOW (ref 11–51)

## 2024-05-30 LAB — TROPONIN T, HIGH SENSITIVITY
Troponin T High Sensitivity: 74 ng/L — ABNORMAL HIGH (ref 0–19)
Troponin T High Sensitivity: 69 ng/L — ABNORMAL HIGH (ref 0–19)

## 2024-05-30 MED ORDER — IOHEXOL 350 MG/ML SOLN
100.0000 mL | Freq: Once | INTRAVENOUS | Status: AC | PRN
  Administered 2024-05-30: 100 mL via INTRAVENOUS

## 2024-05-30 MED ORDER — AMOXICILLIN-POT CLAVULANATE 875-125 MG PO TABS: 1.0000 | ORAL_TABLET | Freq: Two times a day (BID) | ORAL | 0 refills | Status: AC

## 2024-05-30 MED ORDER — OXYCODONE-ACETAMINOPHEN 5-325 MG PO TABS
1.0000 | ORAL_TABLET | Freq: Once | ORAL | Status: AC
  Administered 2024-05-30: 1 via ORAL
  Filled 2024-05-30: qty 1

## 2024-05-30 MED ORDER — SODIUM CHLORIDE 0.9 % IV BOLUS
500.0000 mL | Freq: Once | INTRAVENOUS | Status: AC
  Administered 2024-05-30: 500 mL via INTRAVENOUS

## 2024-05-30 MED ORDER — HYDROMORPHONE HCL 1 MG/ML IJ SOLN
1.0000 mg | Freq: Once | INTRAMUSCULAR | Status: AC
  Administered 2024-05-30: 1 mg via INTRAVENOUS
  Filled 2024-05-30: qty 1
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
## 2024-05-30 NOTE — ED Provider Triage Note (Signed)
 Emergency Medicine Provider Triage Evaluation Note  Victor [name] , a 69 y.o. male  was evaluated in triage.  Pt complains of severe pain in chest abdomen and back going down the legs..  Review of Systems  Positive: History of aortic repair.  20 out of 10 pain in chest, abdomen, going to his back and down his legs.  Also reports he is still having cough, fevers, and chills. Negative: Does not reporting nausea, vomiting or bowel changes.  Physical Exam  BP 133/76 (BP Location: Right Arm)   Pulse 87   Temp 99 F (37.2 C) (Oral)   Resp 16   SpO2 100%  Gen:   Awake,  describing severe diffuse pain in his chest abdomen and back Resp:  Normal effort, back nontender.  Chest tender.  Lungs have coarseness bilaterally MSK:   Moves extremities without difficulty Other:  I did feel pulses in both arms and both legs.  Medical Decision Making  Medically screening exam initiated at 4:47 PM.  Appropriate orders placed.  Victor [name] was informed that the remainder of the evaluation will be completed by another provider, this initial triage assessment does not replace that evaluation, and the importance of remaining in the ED until their evaluation is complete.  Victor [name] is a 69 y.o. male with a past medical history significant for previous aortic dissection status post repair, previous stroke, kidney stones, hypertension, previous femoral-femoral bypass grafting, and recent admission for RSV and pneumonia who presents from his cardiology office with concern for aortic etiology of your pain.  Patient reports he was doing well and then went to an appointment this afternoon at about 130 when he had sudden onset of severe pain.  He describes it as 20 out of 10 with a sharp stabbing pain in his chest abdomen going to his back.  Reports also get down to his legs.  He is unsure if this feels like his previous aortic problems or not but he was getting seen by cardiology and they sent him directly here  as they are worried about his aorta.  He does report he is still having subjective fevers, chills and cough.  He is not reporting GI or GU symptoms at this time.  No reported trauma.  On my exam, he did have coarse breath sounds and his chest was tender.  Abdomen was diffusely tender.  Back was not focally tender.  I did feel pulses in both arms although the right radial pulse was stronger than the left.  In both legs I did feel a dorsalis pedis pulse.  Will start his workup from the medical screening evaluation and order labs, CT dissection study, and to give him some pain medicine.  Anticipate full evaluation when he is seen by provider in exam room.    Etheridge Geil, Lonni PARAS, MD 05/30/24 [id number]

## 2024-05-30 NOTE — Discharge Instructions (Addendum)
 There was questionable pneumonia on your CT scan which I do not think is definitive but I will cover you with Augmentin .  Please take this as prescribed.  If you have any Fever or chills then please, to ED for further evaluation  Please follow-up with oncology for your known cancer  Please follow-up with your primary care physician for pain.

## 2024-05-30 NOTE — Progress Notes (Signed)
 Patient complain of significant back patient and stated he needed to go to the hospital. Spoke with Dr. Rosalinda and Dr. Lugenia. EMS called.

## 2024-05-30 NOTE — ED Triage Notes (Signed)
 [facility] for [facility] and concern for AAA. Pt with chronic back pain, states it is a 10/10.

## 2024-05-31 ENCOUNTER — Emergency Department (HOSPITAL_COMMUNITY)
Admission: EM | Admit: 2024-05-31 | Discharge: 2024-06-01 | Disposition: A | Source: Home / Self Care | Attending: Emergency Medicine | Admitting: Emergency Medicine

## 2024-05-31 ENCOUNTER — Other Ambulatory Visit: Payer: Self-pay

## 2024-05-31 ENCOUNTER — Emergency Department (HOSPITAL_COMMUNITY)

## 2024-05-31 DIAGNOSIS — G8929 Other chronic pain: Secondary | ICD-10-CM

## 2024-05-31 LAB — URINALYSIS, ROUTINE W REFLEX MICROSCOPIC
Bacteria, UA: NONE SEEN
Bilirubin Urine: NEGATIVE
Glucose, UA: NEGATIVE mg/dL
Ketones, ur: 20 mg/dL — AB
Leukocytes,Ua: NEGATIVE
Nitrite: NEGATIVE
Protein, ur: 100 mg/dL — AB
Specific Gravity, Urine: 1.03 (ref 1.005–1.030)
pH: 5 (ref 5.0–8.0)

## 2024-05-31 LAB — COMPREHENSIVE METABOLIC PANEL WITH GFR
ALT: 32 U/L (ref 0–44)
AST: 114 U/L — ABNORMAL HIGH (ref 15–41)
Albumin: 3.7 g/dL (ref 3.5–5.0)
Alkaline Phosphatase: 88 U/L (ref 38–126)
Anion gap: 19 — ABNORMAL HIGH (ref 5–15)
BUN: 23 mg/dL (ref 8–23)
CO2: 18 mmol/L — ABNORMAL LOW (ref 22–32)
Calcium: 9.2 mg/dL (ref 8.9–10.3)
Chloride: 101 mmol/L (ref 98–111)
Creatinine, Ser: 0.95 mg/dL (ref 0.61–1.24)
GFR, Estimated: >60 mL/min
Glucose, Bld: 74 mg/dL (ref 70–99)
Potassium: 4.6 mmol/L (ref 3.5–5.1)
Sodium: 138 mmol/L (ref 135–145)
Total Bilirubin: 0.9 mg/dL (ref 0.0–1.2)
Total Protein: 7.4 g/dL (ref 6.5–8.1)

## 2024-05-31 LAB — CBC
HCT: 41.8 % (ref 39.0–52.0)
Hemoglobin: 13.4 g/dL (ref 13.0–17.0)
MCH: 28 pg (ref 26.0–34.0)
MCHC: 32.1 g/dL (ref 30.0–36.0)
MCV: 87.3 fL (ref 80.0–100.0)
Platelets: 360 10*3/uL (ref 150–400)
RBC: 4.79 MIL/uL (ref 4.22–5.81)
RDW: 13.8 % (ref 11.5–15.5)
WBC: 6.8 10*3/uL (ref 4.0–10.5)
nRBC: 0 % (ref 0.0–0.2)

## 2024-05-31 LAB — TROPONIN T, HIGH SENSITIVITY
Troponin T High Sensitivity: 44 ng/L — ABNORMAL HIGH (ref 0–19)
Troponin T High Sensitivity: 38 ng/L — ABNORMAL HIGH (ref 0–19)

## 2024-05-31 LAB — LIPASE, BLOOD: Lipase: 11 U/L (ref 11–51)

## 2024-05-31 NOTE — ED Triage Notes (Signed)
 Patient bib [facility] from home with complaints of abdominal pain and right flank. Was here 2 days ago for same symptoms. Not eaten in 2 days, Refused to answer questions for EMS.    EMS VS:   CBG: 78  124/89 HR 80 97RA

## 2024-05-31 NOTE — ED Provider Triage Note (Signed)
 Emergency Medicine Provider Triage Evaluation Note  Victor [name] , a 69 y.o. male  was evaluated in triage.  Pt complains of chest pain, abdominal pain, bilateral leg pain.  Notes symptoms for several days but getting worse.  Seen at ED yesterday for similar symptoms.  CT angio chest abdomen pelvis negative at that time.  No new fall or injury.  Review of Systems  Positive: Chest pain, shortness of breath, leg pain Negative: Nausea, vomiting, diarrhea, fever  Physical Exam  BP 117/86 (BP Location: Left Arm)   Pulse 94   Temp 98.6 F (37 C)   Resp 20   SpO2 97%  Gen:   Awake, no distress   Resp:  Normal effort  MSK:   Moves extremities without difficulty  Other:    Medical Decision Making  Medically screening exam initiated at 2:05 PM.  Appropriate orders placed.  Victor [name] was informed that the remainder of the evaluation will be completed by another provider, this initial triage assessment does not replace that evaluation, and the importance of remaining in the ED until their evaluation is complete.     Victor [name], [place] 05/31/24 1406

## 2024-06-01 ENCOUNTER — Ambulatory Visit: Admitting: Acute Care

## 2024-06-01 MED ORDER — OXYCODONE-ACETAMINOPHEN 5-325 MG PO TABS
2.0000 | ORAL_TABLET | Freq: Once | ORAL | Status: AC
  Administered 2024-06-01: 2 via ORAL
  Filled 2024-06-01: qty 2

## 2024-06-01 NOTE — Discharge Instructions (Signed)
 Unfortunately, we can't refill or redo your recent prescription.   Please follow-up with your doctor and/or a chronic pain management doctor.

## 2024-06-01 NOTE — ED Provider Notes (Signed)
 " [facility] Emergency Department Provider Note MRN:  [id number]  Arrival date & time: 06/01/24     Chief Complaint   Abdominal Pain   History of Present Illness   Victor [name] is a 69 y.o. year-old male presents to the ED with chief complaint of chronic pain.  States that there is nothing new about his pain.  States that he lost his Percocet prescription.  States he needs a refill.  He was seen yesterday for the same and had very thorough and reassuring workup.  He states that he just needs better pain control.  Denies any new complaints tonight.  History provided by patient.   Review of Systems  Pertinent positive and negative review of systems noted in HPI.    Physical Exam   Vitals:   05/31/24 1346 05/31/24 2125  BP: 117/86 (!) 143/111  Pulse: 94 (!) 107  Resp: 20 (!) 22  Temp: 98.6 F (37 C) 98.7 F (37.1 C)  SpO2: 97% 94%    CONSTITUTIONAL:  non toxic-appearing, NAD NEURO:  Alert and oriented x 3, CN 3-12 grossly intact EYES:  eyes equal and reactive ENT/NECK:  Supple, no stridor  CARDIO:  mildly tachycardic, regular rhythm, appears well-perfused  PULM:  No respiratory distress, CTAB GI/GU:  non-distended,  MSK/SPINE:  No gross deformities, no edema, moves all extremities  SKIN:  no rash, atraumatic   *Additional and/or pertinent findings included in MDM below  Diagnostic and Interventional Summary    EKG Interpretation Date/Time:  Thursday May 31 2024 14:28:28 EST Ventricular Rate:  90 PR Interval:  150 QRS Duration:  66 QT Interval:  362 QTC Calculation: 442 R Axis:   44  Text Interpretation: Normal sinus rhythm Possible Left atrial enlargement Anterior infarct , age undetermined Abnormal ECG When compared with ECG of 30-May-2024 22:13, PREVIOUS ECG IS PRESENT Confirmed by Lorette Mayo [phone]) on 05/31/2024 11:49:42 PM       Labs Reviewed  COMPREHENSIVE METABOLIC PANEL WITH GFR - Abnormal; Notable for the following  components:      Result Value   CO2 18 (*)    AST 114 (*)    Anion gap 19 (*)    All other components within normal limits  URINALYSIS, ROUTINE W REFLEX MICROSCOPIC - Abnormal; Notable for the following components:   APPearance HAZY (*)    Hgb urine dipstick SMALL (*)    Ketones, ur 20 (*)    Protein, ur 100 (*)    All other components within normal limits  TROPONIN T, HIGH SENSITIVITY - Abnormal; Notable for the following components:   Troponin T High Sensitivity 44 (*)    All other components within normal limits  TROPONIN T, HIGH SENSITIVITY - Abnormal; Notable for the following components:   Troponin T High Sensitivity 38 (*)    All other components within normal limits  LIPASE, BLOOD  CBC    DG Chest 1 View  Final Result      Medications  oxyCODONE -acetaminophen  (PERCOCET/ROXICET) 5-325 MG per tablet 2 tablet (has no administration in time range)     Procedures  /  Critical Care Procedures  ED Course and Medical Decision Making  I have reviewed the triage vital signs, the nursing notes, and pertinent available records from the EMR.  Social Determinants Affecting Complexity of Care: Patient has no clinically significant social determinants affecting this chief complaint..   ED Course:    Medical Decision Making Patient returns to the emergency department tonight.  He states that he still feels achy all over.  He was seen yesterday for the same.  He has known malignancy.  He has known chronic dissection.  He has outpatient follow-up with pulmonology in about 10 days.  States that his primary reason for coming in tonight is because he lost his Percocet prescription.  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  No evidence of pneumonia.  Troponins are essentially flat, and similar to recent workup.  He is nontoxic-appearing.  I suspect that his primary reason for the visit today is for pain control.  I question if he actually lost his prescription or took it  in a way other than as prescribed.  I will give him a dose of Percocet now.  I have encouraged him to follow-up with community health and wellness and pain clinic and pulmonology.  Amount and/or Complexity of Data Reviewed Labs: ordered.  Risk Prescription drug management.         Consultants: No consultations were needed in caring for this patient.   Treatment and Plan: I considered admission due to patient's initial presentation, but after considering the examination and diagnostic results, patient will not require admission and can be discharged with outpatient follow-up.    Final Clinical Impressions(s) / ED Diagnoses     ICD-10-CM   1. Other chronic pain  G89.29       ED Discharge Orders     None         Discharge Instructions Discussed with and Provided to Patient:    Discharge Instructions      Unfortunately, we can't refill or redo your recent prescription.   Please follow-up with your doctor and/or a chronic pain management doctor.      Vicky Charleston, PA-C 06/01/24 0041    [name], MD 06/01/24 [phone]  "

## 2024-06-04 ENCOUNTER — Ambulatory Visit (HOSPITAL_COMMUNITY)

## 2024-06-11 ENCOUNTER — Encounter (HOSPITAL_COMMUNITY): Admission: RE | Payer: Self-pay | Source: Home / Self Care

## 2024-06-11 ENCOUNTER — Encounter (HOSPITAL_COMMUNITY): Payer: Self-pay | Admitting: Vascular Surgery

## 2024-06-11 ENCOUNTER — Ambulatory Visit (HOSPITAL_COMMUNITY): Admission: RE | Admit: 2024-06-11 | Source: Home / Self Care | Admitting: Emergency Medicine

## 2024-06-11 DIAGNOSIS — R918 Other nonspecific abnormal finding of lung field: Secondary | ICD-10-CM | POA: Insufficient documentation

## 2024-06-18 ENCOUNTER — Ambulatory Visit: Admitting: Acute Care

## 2024-07-02 ENCOUNTER — Encounter: Admitting: Surgery

## 2024-07-12 ENCOUNTER — Ambulatory Visit: Admitting: Internal Medicine
